# Patient Record
Sex: Male | Born: 1935 | Race: White | Hispanic: No | Marital: Married | Smoking: Former smoker
Health system: Southern US, Community
[De-identification: ages and names within clinical notes are randomized; demographics above are authoritative.]

## PROBLEM LIST (undated history)

## (undated) DIAGNOSIS — T8859XA Other complications of anesthesia, initial encounter: Secondary | ICD-10-CM

## (undated) DIAGNOSIS — R0600 Dyspnea, unspecified: Secondary | ICD-10-CM

## (undated) DIAGNOSIS — J189 Pneumonia, unspecified organism: Secondary | ICD-10-CM

## (undated) DIAGNOSIS — Z87891 Personal history of nicotine dependence: Secondary | ICD-10-CM

## (undated) DIAGNOSIS — H9190 Unspecified hearing loss, unspecified ear: Secondary | ICD-10-CM

## (undated) DIAGNOSIS — J449 Chronic obstructive pulmonary disease, unspecified: Secondary | ICD-10-CM

## (undated) DIAGNOSIS — M1990 Unspecified osteoarthritis, unspecified site: Secondary | ICD-10-CM

## (undated) DIAGNOSIS — I2510 Atherosclerotic heart disease of native coronary artery without angina pectoris: Secondary | ICD-10-CM

## (undated) DIAGNOSIS — Z87442 Personal history of urinary calculi: Secondary | ICD-10-CM

## (undated) DIAGNOSIS — I499 Cardiac arrhythmia, unspecified: Secondary | ICD-10-CM

## (undated) DIAGNOSIS — C189 Malignant neoplasm of colon, unspecified: Secondary | ICD-10-CM

## (undated) DIAGNOSIS — T4145XA Adverse effect of unspecified anesthetic, initial encounter: Secondary | ICD-10-CM

## (undated) DIAGNOSIS — I10 Essential (primary) hypertension: Secondary | ICD-10-CM

## (undated) HISTORY — DX: Essential (primary) hypertension: I10

## (undated) HISTORY — DX: Atherosclerotic heart disease of native coronary artery without angina pectoris: I25.10

## (undated) HISTORY — DX: Personal history of nicotine dependence: Z87.891

## (undated) HISTORY — DX: Unspecified osteoarthritis, unspecified site: M19.90

## (undated) HISTORY — DX: Malignant neoplasm of colon, unspecified: C18.9

## (undated) HISTORY — DX: Chronic obstructive pulmonary disease, unspecified: J44.9

---

## 2006-02-09 HISTORY — PX: CORONARY ANGIOPLASTY WITH STENT PLACEMENT: SHX49

## 2007-11-09 ENCOUNTER — Ambulatory Visit: Payer: Self-pay | Admitting: Cardiology

## 2007-11-16 ENCOUNTER — Ambulatory Visit: Payer: Self-pay

## 2007-11-21 ENCOUNTER — Ambulatory Visit: Payer: Self-pay | Admitting: Cardiology

## 2007-11-21 LAB — CONVERTED CEMR LAB
BUN: 12 mg/dL (ref 6–23)
Chloride: 102 meq/L (ref 96–112)
Eosinophils Absolute: 0.3 10*3/uL (ref 0.0–0.7)
Eosinophils Relative: 3.1 % (ref 0.0–5.0)
GFR calc Af Amer: 123 mL/min
GFR calc non Af Amer: 102 mL/min
HCT: 45.7 % (ref 39.0–52.0)
Hemoglobin: 15.3 g/dL (ref 13.0–17.0)
INR: 1 (ref 0.8–1.0)
MCV: 94.7 fL (ref 78.0–100.0)
Monocytes Absolute: 0.6 10*3/uL (ref 0.1–1.0)
Neutrophils Relative %: 71.7 % (ref 43.0–77.0)
Platelets: 254 10*3/uL (ref 150–400)
Potassium: 4.5 meq/L (ref 3.5–5.1)
RBC: 4.83 M/uL (ref 4.22–5.81)
RDW: 12.9 % (ref 11.5–14.6)
Sodium: 140 meq/L (ref 135–145)
WBC: 8 10*3/uL (ref 4.5–10.5)
aPTT: 27.5 s (ref 21.7–29.8)

## 2007-11-23 ENCOUNTER — Ambulatory Visit: Payer: Self-pay | Admitting: Cardiovascular Disease

## 2007-11-23 ENCOUNTER — Inpatient Hospital Stay (HOSPITAL_BASED_OUTPATIENT_CLINIC_OR_DEPARTMENT_OTHER): Admission: RE | Admit: 2007-11-23 | Discharge: 2007-11-23 | Payer: Self-pay | Admitting: Cardiology

## 2007-11-23 ENCOUNTER — Ambulatory Visit: Payer: Self-pay | Admitting: Cardiology

## 2007-11-23 ENCOUNTER — Inpatient Hospital Stay (HOSPITAL_COMMUNITY): Admission: AD | Admit: 2007-11-23 | Discharge: 2007-11-24 | Payer: Self-pay | Admitting: Cardiovascular Disease

## 2007-12-28 ENCOUNTER — Ambulatory Visit: Payer: Self-pay | Admitting: Cardiology

## 2008-01-10 HISTORY — PX: UMBILICAL HERNIA REPAIR: SHX196

## 2008-01-26 ENCOUNTER — Ambulatory Visit (HOSPITAL_COMMUNITY): Admission: RE | Admit: 2008-01-26 | Discharge: 2008-01-27 | Payer: Self-pay | Admitting: General Surgery

## 2008-01-26 ENCOUNTER — Encounter (INDEPENDENT_AMBULATORY_CARE_PROVIDER_SITE_OTHER): Payer: Self-pay | Admitting: General Surgery

## 2008-04-30 DIAGNOSIS — I2510 Atherosclerotic heart disease of native coronary artery without angina pectoris: Secondary | ICD-10-CM | POA: Insufficient documentation

## 2008-04-30 DIAGNOSIS — I10 Essential (primary) hypertension: Secondary | ICD-10-CM | POA: Insufficient documentation

## 2008-05-02 ENCOUNTER — Ambulatory Visit: Payer: Self-pay | Admitting: Cardiology

## 2008-09-03 ENCOUNTER — Encounter (INDEPENDENT_AMBULATORY_CARE_PROVIDER_SITE_OTHER): Payer: Self-pay | Admitting: *Deleted

## 2008-10-04 ENCOUNTER — Encounter: Payer: Self-pay | Admitting: Cardiology

## 2008-12-12 ENCOUNTER — Ambulatory Visit: Payer: Self-pay | Admitting: Cardiology

## 2008-12-12 DIAGNOSIS — E785 Hyperlipidemia, unspecified: Secondary | ICD-10-CM | POA: Insufficient documentation

## 2009-02-09 HISTORY — PX: OTHER SURGICAL HISTORY: SHX169

## 2009-02-09 HISTORY — PX: PORTACATH PLACEMENT: SHX2246

## 2009-09-02 ENCOUNTER — Telehealth: Payer: Self-pay | Admitting: Cardiology

## 2009-09-04 ENCOUNTER — Telehealth: Payer: Self-pay | Admitting: Cardiology

## 2009-10-18 ENCOUNTER — Ambulatory Visit: Admission: RE | Admit: 2009-10-18 | Discharge: 2009-12-16 | Payer: Self-pay | Admitting: Radiation Oncology

## 2009-10-18 ENCOUNTER — Ambulatory Visit (HOSPITAL_COMMUNITY): Admission: RE | Admit: 2009-10-18 | Discharge: 2009-10-18 | Payer: Self-pay | Admitting: Internal Medicine

## 2009-11-11 ENCOUNTER — Telehealth: Payer: Self-pay | Admitting: Cardiology

## 2009-12-18 ENCOUNTER — Ambulatory Visit: Payer: Self-pay | Admitting: Cardiology

## 2010-01-09 ENCOUNTER — Telehealth (INDEPENDENT_AMBULATORY_CARE_PROVIDER_SITE_OTHER): Payer: Self-pay | Admitting: *Deleted

## 2010-01-10 HISTORY — PX: HEMICOLECTOMY: SHX854

## 2010-02-09 HISTORY — PX: INGUINAL HERNIA REPAIR: SUR1180

## 2010-03-06 ENCOUNTER — Telehealth: Payer: Self-pay | Admitting: Cardiology

## 2010-03-11 NOTE — Assessment & Plan Note (Signed)
Summary: [facility]  Medications Added HYDROCHLOROTHIAZIDE 25 MG TABS (HYDROCHLOROTHIAZIDE) 1 by mouth daily      Allergies Added: NKDA  Visit Type:  Follow-up Primary Provider:  Belva Agee, NP  CC:  CAD.  History of Present Illness: The patient presents for yearly followup. Unfortunately since I last saw him he has been diagnosed with colon cancer and he has undergone some chemotherapy and radiation. He is also apparently going to have surgery. He has had no new chest discomfort, neck or arm discomfort. He has had no new palpitations, presyncope or syncope. He has been exercising routinely despite limitations with back and leg problems. He walks with 2 canes.  Current Medications (verified): 1)  Lotrel 10-40 Mg Caps (Amlodipine Besy-Benazepril Hcl) .Marland Kitchen.. 1 By Mouth Daily 2)  Simvastatin 20 Mg Tabs (Simvastatin) .... One Every Evening 3)  Metoprolol Tartrate 100 Mg Tabs (Metoprolol Tartrate) .Marland Kitchen.. 1 By Mouth Daily 4)  Aspirin 325 Mg  Tabs (Aspirin) .Marland Kitchen.. 1 By Mouth Daily 5)  Nitrostat 0.4 Mg Subl (Nitroglycerin) .Marland Kitchen.. 1 By Mouth As Needed For Chest Pain 6)  Hydrochlorothiazide 25 Mg Tabs (Hydrochlorothiazide) .Marland Kitchen.. 1 By Mouth Daily  Allergies (verified): No Known Drug Allergies  Past History:  Past Medical History:  1. Coronary artery disease (abnormal stress perfusion study with       apical and inferior wall ischemia.  The LAD had a long 99% stenosis       followed by 30% stenosis, first diagonal 25% stenosis, circumflex       luminal irregularities, second obtuse marginal 25% stenosis, right       coronary artery 40% followed by 40% stenosis.  EF 60%.  He       underwent stenting with 2 bare-metal stents by Dr. Excell Seltzer to the       LAD).   2. Hypertension.   3. Previous tobacco use.   4. Colon cancer     Past Surgical History: Reviewed history from 12/12/2008 and no changes required. Umbilical hernia repair  Review of Systems       As stated in the HPI and  negative for all other systems.   Vital Signs:  Patient profile:   75 year old male Height:      75 inches Weight:      199 pounds BMI:     24.96 Pulse rate:   67 / minute Resp:     18 per minute BP sitting:   104 / 60  (right arm)  Vitals Entered By: Marrion Coy, CNA (December 18, 2009 11:34 AM)  Physical Exam  General:  Well developed, well nourished, in no acute distress. Head:  normocephalic and atraumatic Mouth:  Port dentition, gums and palate normal. Oral mucosa normal. Neck:  Neck supple, no JVD. No masses, thyromegaly or abnormal cervical nodes. Chest Wall:  no deformities or breast masses noted Lungs:  Clear bilaterally to auscultation and percussion. Heart:  Non-displaced PMI, chest non-tender; regular rate and rhythm, S1, S2 without murmurs, rubs or gallops. Carotid upstroke normal, no bruit. Normal abdominal aortic size, no bruits. Femorals normal pulses, no bruits. Pedals normal pulses.  Abdomen:  Bowel sounds positive; abdomen soft and non-tender without masses, organomegaly, or hernias noted. No hepatosplenomegaly. Msk:  Back normal, normal gait. Muscle strength and tone normal. Extremities:  Moderate bilateral lower extremity edema with chronic venous stasis changes Neurologic:  Alert and oriented x 3. Skin:  Intact without lesions or rashes. Cervical Nodes:  no significant adenopathy Axillary Nodes:  no significant adenopathy Inguinal Nodes:  no significant adenopathy Psych:  Normal affect.    EKG  Procedure date:  12/18/2009  Findings:      Sinus rhythm, right bundle branch block, no acute ST-T wave changes  Impression & Recommendations:  Problem # 1:  CAD, UNSPECIFIED SITE (ICD-414.00) The patient has had no new cardiovascular symptoms. No further cardiovascular testing is suggested. We will continue with risk reduction. According to ACC/AHA guidelines he would be an acceptable risk for upcoming colon surgery. Orders: EKG w/ Interpretation  (93000)  Problem # 2:  HYPERTENSION, UNSPECIFIED (ICD-401.9) His blood pressure is actually running low.  He will remain on the meds as listed.  Problem # 3:  DYSLIPIDEMIA (ICD-272.4) He is having this followed by his primary MD.  The goal will be an LDL less than 70 and HDL greater than 40.  Patient Instructions: 1)  Your physician recommends that you schedule a follow-up appointment in: 12 MONTHS WITH DR [facility] [place] 2)  Your physician recommends that you continue on your current medications as directed. Please refer to the Current Medication list given to you today. Prescriptions: HYDROCHLOROTHIAZIDE 25 MG TABS (HYDROCHLOROTHIAZIDE) 1 by mouth daily  #90 x 3   Entered by:   Marrion Coy, CNA   Authorized by:   Rollene Rotunda, MD, [facility]   Signed by:   Marrion Coy, CNA on 12/18/2009   Method used:   Faxed to ...       [facility] (mail-order)             , [place]         Ph: [phone]       Fax: [phone]   RxID:   [id number]

## 2010-03-11 NOTE — Progress Notes (Signed)
Summary: refill request   Phone Note Refill Request Message from:  Patient on November 11, 2009 12:25 PM  Refills Requested: Medication #1:  METOPROLOL-HYDROCHLOROTHIAZIDE 50-25 MG TABS 1 by mouth daily  Medication #2:  SIMVASTATIN 20 MG TABS one every evening amlodipine uses [facility]   Method Requested: Telephone to Pharmacy Initial call taken by: Glynda Jaeger,  November 11, 2009 12:26 PM  Follow-up for Phone Call        [facility] for pt that RX sent in. Follow-up by: Marrion Coy, CNA,  November 12, 2009 10:28 AM    Prescriptions: SIMVASTATIN 20 MG TABS (SIMVASTATIN) one every evening  #90 x 1   Entered by:   Marrion Coy, CNA   Authorized by:   Rollene Rotunda, MD, [facility]   Signed by:   Marrion Coy, CNA on 11/12/2009   Method used:   Faxed to ...       [facility] (mail-order)             , [place]         Ph: [phone]       Fax: [phone]   RxID:   [id number] METOPROLOL TARTRATE 100 MG TABS (METOPROLOL TARTRATE) 1 by mouth daily  #90 Tablet x 1   Entered by:   Marrion Coy, CNA   Authorized by:   Rollene Rotunda, MD, [facility]   Signed by:   Marrion Coy, CNA on 11/12/2009   Method used:   Faxed to ...       [facility] (mail-order)             , [place]         Ph: [phone]       Fax: [phone]   RxID:   [id number] LOTREL 10-40 MG CAPS (AMLODIPINE BESY-BENAZEPRIL HCL) 1 by mouth daily  #90 x 1   Entered by:   Marrion Coy, CNA   Authorized by:   Rollene Rotunda, MD, [facility]   Signed by:   Marrion Coy, CNA on 11/12/2009   Method used:   Faxed to ...       [facility] (mail-order)             , [place]         Ph: [phone]       Fax: [phone]   RxID:   [id number] METOPROLOL-HYDROCHLOROTHIAZIDE 50-25 MG TABS (METOPROLOL-HYDROCHLOROTHIAZIDE) 1 by mouth daily  #90 Tablet x 1   Entered by:   Marrion Coy, CNA   Authorized by:   Rollene Rotunda, MD, [facility]   Signed by:   Marrion Coy, CNA on 11/12/2009   Method used:   Faxed to ...       [facility] (mail-[facility])             , [place]          Ph: [phone]       Fax: [phone]   RxID:   [id number]

## 2010-03-11 NOTE — Progress Notes (Signed)
Summary: verify medication   Phone Note From Pharmacy Call back at [phone]   Caller: [facility]/ [phone] Summary of Call: Need to verify Simvastatin Initial call taken by: Judie Grieve,  September 02, 2009 1:20 PM  Follow-up for Phone Call        PHARMACY NOTIFIED WILL FORWARD TO DR [facility]  FOR REVIEW IN MEANTIME WILL NOTIFY PT TO DECREASE SIMVASTATIN TO 20 MG .  PT AWARE TO DECREASE SIMVASTATIN TO 20 MG Follow-up by: Scherrie Bateman, LPN,  September 02, 2009 1:38 PM

## 2010-03-11 NOTE — Progress Notes (Signed)
Summary: Calling regarding Simvastatin 20mg  need new prescription  Medications Added SIMVASTATIN 20 MG TABS (SIMVASTATIN) one every evening       Phone Note From Pharmacy Call back at [phone]   Caller: [facility] Summary of Call: Calling regarding Simvastatin 20mg  fax new prescription to [facility] [phone] Initial call taken by: Judie Grieve,  September 04, 2009 2:01 PM    New/Updated Medications: SIMVASTATIN 20 MG TABS (SIMVASTATIN) one every evening Prescriptions: SIMVASTATIN 20 MG TABS (SIMVASTATIN) one every evening  #90 x 3   Entered by:   Charolotte Capuchin, RN   Authorized by:   Rollene Rotunda, MD, [facility]   Signed by:   Charolotte Capuchin, RN on 09/04/2009   Method used:   Faxed to ...       [facility] (mail-order)             , [place]         Ph: [phone]       Fax: [phone]   RxID:   [id number]

## 2010-03-11 NOTE — Progress Notes (Signed)
   All Cardiac records faxed to [facility] @ [phone] [facility]  January 09, 2010 9:04 AM

## 2010-03-13 NOTE — Progress Notes (Signed)
Summary: pt needs refill sent to [facility]   Phone Note Refill Request Message from:  Patient on [facility]   Refills Requested: Medication #1:  SIMVASTATIN 20 MG TABS one every evening  Medication #2:  LOTREL 10-40 MG CAPS 1 by mouth daily  Medication #3:  METOPROLOL TARTRATE 100 MG TABS 1 by mouth daily pt needs 90day suppy with 3refills sent into [facility]  Initial call taken by: Omer Jack,  March 06, 2010 1:24 PM  Follow-up for Phone Call        RX sent into pharmacy. Pt notified. Pt needs to get rx for simvastatin from pcp. pt understands. Marrion Coy, CNA  March 06, 2010 1:32 PM  Follow-up by: Marrion Coy, CNA,  March 06, 2010 1:32 PM    Prescriptions: METOPROLOL TARTRATE 100 MG TABS (METOPROLOL TARTRATE) 1 by mouth daily  #90 Tablet x 2   Entered by:   Marrion Coy, CNA   Authorized by:   Rollene Rotunda, MD, [facility]   Signed by:   Marrion Coy, CNA on 03/06/2010   Method used:   Faxed to ...       [facility] (mail-order)             , [place]         Ph: [phone]       Fax: [phone]   RxID:   [id number] LOTREL 10-40 MG CAPS (AMLODIPINE BESY-BENAZEPRIL HCL) 1 by mouth daily  #90 x 2   Entered by:   Marrion Coy, CNA   Authorized by:   Rollene Rotunda, MD, [facility]   Signed by:   Marrion Coy, CNA on 03/06/2010   Method used:   Faxed to ...       [facility] (mail-[facility])             , [place]         Ph: [phone]       Fax: [phone]   RxID:   [id number]

## 2010-04-24 LAB — GLUCOSE, CAPILLARY: Glucose-Capillary: 102 mg/dL — ABNORMAL HIGH (ref 70–99)

## 2010-06-02 ENCOUNTER — Encounter: Payer: Self-pay | Admitting: Nurse Practitioner

## 2010-06-02 DIAGNOSIS — M1990 Unspecified osteoarthritis, unspecified site: Secondary | ICD-10-CM | POA: Insufficient documentation

## 2010-06-02 DIAGNOSIS — I2510 Atherosclerotic heart disease of native coronary artery without angina pectoris: Secondary | ICD-10-CM | POA: Insufficient documentation

## 2010-06-02 DIAGNOSIS — M1120 Other chondrocalcinosis, unspecified site: Secondary | ICD-10-CM | POA: Insufficient documentation

## 2010-06-02 DIAGNOSIS — C189 Malignant neoplasm of colon, unspecified: Secondary | ICD-10-CM | POA: Insufficient documentation

## 2010-06-02 DIAGNOSIS — I10 Essential (primary) hypertension: Secondary | ICD-10-CM | POA: Insufficient documentation

## 2010-06-24 NOTE — Discharge Summary (Signed)
[name], [name]                 ACCOUNT NO.:  [id number]   MEDICAL RECORD NO.:  [id number]          PATIENT TYPE:  OIB   LOCATION:  6527                         FACILITY:  [facility]   PHYSICIAN:  Rollene Rotunda, MD, FACCDATE OF BIRTH:  1936-01-17   DATE OF ADMISSION:  11/09/2007  DATE OF DISCHARGE:                               DISCHARGE SUMMARY   PRIMARY CARDIOLOGIST:  Rollene Rotunda, MD, [facility].   PRIMARY CARE [name]:  [name], MD   DISCHARGE DIAGNOSIS:  Coronary artery disease.   SECONDARY DIAGNOSES:  1. Hypertension.  2. Tobacco abuse.  3. Large umbilical hernia, pending surgical repair.  4. Mild hyperglycemia/? Impaired fasting glucose.   ALLERGIES:  No known drug allergies.   PROCEDURES:  Left heart cardiac catheterization with successful  percutaneous coronary intervention and stenting of the proximal left  anterior descending with placement of 3.5 x 15-mm Vision bare-metal  stent as well as a 3.0 x 8-mm Vision bare-metal stent.   HISTORY OF PRESENT ILLNESS:  A 75 year old Caucasian male with the above  problem list.  He recently saw Dr. Antoine Poche in clinic on November 09, 2007, for cardiology clearance as he is pending umbilical hernia repair.  Given his risk factors as determined that he would require an adenosine  Myoview.  Myoview was subsequently performed on November 16, 2007,  revealing an EF of 58% with suggestive apical and inferior ischemia.  Decision was made to pursue cardiac catheterization.   HOSPITAL COURSE:  The patient underwent left heart cardiac  catheterization on November 23, 2007, revealing a 99% stenosis in the  proximal LAD.  He otherwise had nonobstructive coronary artery disease  with normal LV function and EF of 60%.  Films were reviewed by Dr.  Tonny Bollman and the patient underwent successful PCI and stenting of  the proximal LAD with placement two bare-metal stents.  The patient  tolerated this procedure well and postprocedure, he  has been ambulating  without difficulty.  He has had no recurrent symptoms.  He will be  discharged home today in good condition.   DISCHARGE LABORATORY DATA:  Hemoglobin 14.4, hematocrit 42.2, WBC 8.0,  and platelets 352.  Sodium 137, potassium 3.5, chloride 101, CO2 28, BUN  5, creatinine 0.61, glucose 103, and calcium 8.9.   DISPOSITION:  The patient will be discharged home today in good  condition.   FOLLOWUP PLANS AND APPOINTMENTS:  The patient is to follow with Dr.  Rudi Heap as previously scheduled and to follow with Dr. Antoine Poche in  [place] on December 28, 2007, at 11:15 a.m.   DISCHARGE MEDICATIONS:  1. Aspirin 325 daily.  2. Plavix 5 mg daily x30 days.  3. Metoprolol/hydrochlorothiazide 50/25 mg daily.  4. Metoprolol 100 mg q.h.s.  5. Amlodipine/benazepril 10/40 mg daily.  6. Nitroglycerin 0.4 mg sublingually p.r.n. chest pain.  7. Simvastatin 40 mg q.h.s.   OUTSTANDING LABORATORY STUDIES:  Fasting lipids and LFTs.   Duration discharge encounter 40 minutes including physician time.      [name], ANP      Rollene Rotunda, MD, [facility]  Electronically  Signed    CB/MEDQ  D:  11/24/2007  T:  11/24/2007  Job:  [id number]   cc:   [name], M.D.

## 2010-06-24 NOTE — Assessment & Plan Note (Signed)
[facility]                            CARDIOLOGY OFFICE NOTE   [name], [name]                        MRN:          [id number]  DATE:05/02/2008                            DOB:          12-Apr-1935    PRIMARY CARE PHYSICIAN:  [name], MD   REASON FOR PRESENTATION:  Evaluate the patient with coronary artery  disease.   HISTORY OF PRESENT ILLNESS:  The patient is 75 years old.  He presents  for followup.  Since I last saw him, he has had his umbilical hernia  repaired.  He did well with this.  He has had no chest discomfort, neck  or arm discomfort.  He has had no palpitations, presyncope, or syncope.  He has stopped smoking!  He is walking regularly for exercise.  He is  trying to watch his diet, though this may not be quite as good as I  would like.   PAST MEDICAL HISTORY:  1. Coronary artery disease (abnormal stress perfusion study with      apical and inferior wall ischemia.  The LAD had a long 99% stenosis      followed by 30% stenosis, first diagonal 25% stenosis, circumflex      luminal irregularities, second obtuse marginal 25% stenosis, right      coronary artery 40% followed by 40% stenosis.  EF 60%.  He      underwent stenting with 2 bare-metal stents by Dr. Excell Seltzer to the      LAD).  2. Hypertension.  3. Previous tobacco use.  4. Status post umbilical hernia repair.   ALLERGIES:  None.   MEDICATIONS:  1. Aspirin 325 mg daily.  2. Metoprolol/HCTZ 50/25 daily.  3. Metoprolol 100 mg daily.  4. Lotrel 10/40 daily.  5. Simvastatin 40 mg daily.   REVIEW OF SYSTEMS:  As stated in the HPI, and otherwise negative for all  other systems.   PHYSICAL EXAMINATION:  GENERAL:  The patient is pleasant and in no  distress.  VITAL SIGNS:  Blood pressure 148/86 and heart rate 53 and regular.  HEENT:  Eyelids unremarkable; pupils equal, round, and reactive to  light, fundi not visualized.  NECK:  No jugular venous distention at 45  degrees; carotid upstroke  brisk and symmetric; no bruits, no thyromegaly.  LYMPHATICS:  No adenopathy.  LUNGS:  Clear to auscultation bilaterally.  BACK:  No costovertebral angle tenderness.  CHEST:  Unremarkable.  HEART:  PMI not displaced or sustained; S1 and S2 within normal limits;  no S3, no S4; no clicks, no rubs, no murmurs.  ABDOMEN:  Flat; well-healed surgical scar; positive bowel sounds; normal  in frequency and pitch; no bruits, no rebound, no guarding; no midline  pulsatile mass, no organomegaly.  SKIN:  No rashes.  No nodules.  EXTREMITIES:  Pulses 2+, no edema.   EKG; sinus bradycardia, right bundle-branch block, premature ectopic  complexes, no acute ST wave changes.   ASSESSMENT AND PLAN:  1. Coronary artery disease.  The patient is having no new symptoms.      No further cardiovascular  testing is suggested.  He will continue      with risk reduction.  2. Hypertension.  Blood pressure is slightly elevated today.  However,      this is unusual.  He states he gets checked a cardiac rehab and is      doing quite well.  Therefore, I will make no change to his medical      regimen.  3. Dyslipidemia.  I reviewed his lipids at [facility].  He had      an excellent lipid profile and we will remain on the meds as      listed.  4. Followup.  I will see him again in 6 months and then probably      yearly thereafter.     Rollene Rotunda, MD, [facility]  Electronically Signed    JH/MedQ  DD: 05/02/2008  DT: 05/03/2008  Job #: [id number]   cc:   [name], M.D.

## 2010-06-24 NOTE — Assessment & Plan Note (Signed)
[facility]                            CARDIOLOGY OFFICE NOTE   [name], [name]                          MRN:          [id number]  DATE:12/28/2007                            DOB:          1935/08/22    PRIMARY CARE PHYSICIAN:  [name], MD   REASON FOR PRESENTATION:  Evaluate the patient with coronary disease  status post PCI.   HISTORY OF PRESENT ILLNESS:  The patient initially presented to my  office for preoperative evaluation prior to getting an umbilical hernia  repaired.  However, he had significant cardiovascular risk factors and  underwent stress perfusion imaging.  This demonstrated an EF of 58%.  There was ischemia in the apex and inferior wall.  The patient  subsequently underwent a cardiac catheterization.  This demonstrated  left main was normal, the LAD had long 99% stenosis followed by long 30%  stenosis.  The first diagonal had 25% stenosis.  The circumflex had  luminal irregularities, the ramus intermediate was normal, first obtuse  marginal was normal, the second obtuse marginal had 25% stenosis.  Right  coronary artery is dominant with proximal 40% followed by 40% stenosis.  EF was 60%.  The patient subsequently underwent stenting with two bare-  metal stents by Dr. Excell Seltzer in the LAD.  He did well with this.  He has  completed his Plavix for 1 month.  He is planning on doing cardiac  rehab.  He has been doing a bit of walking around his house and he says  he is not as tired as he used to be.  He is not having any chest  discomfort, neck or arm discomfort.  He is not having any palpitation,  presyncope, or syncope.  He is having no PND or orthopnea.   PAST MEDICAL HISTORY:  Coronary artery disease as described,  hypertension, longstanding tobacco abuse (he is down to eight  cigarettes).   ALLERGIES:  None.   MEDICATIONS:  1. Aspirin 325 mg daily.  2. Metoprolol/hydrochlorothiazide 50/25 daily.  3. Metoprolol 100 mg  daily.  4. Lotrel 10/40 daily.  5. Simvastatin 40 mg daily.   REVIEW OF SYSTEMS:  As stated in the HPI and otherwise negative for  other systems.   PHYSICAL EXAMINATION:  GENERAL:  The patient is in no distress.  VITAL SIGNS:  Blood pressure 130/72, heart rate 66 and regular, weight  220 pounds, body mass index 26.  HEENT:  Eyes unremarkable.  Pupils equal, round, and reactive to light.  Fundi not visualized.  Oral mucosa unremarkable.  NECK:  No jugular venous distention at 45 degrees.  Carotid upstroke  brisk and symmetrical.  No bruits, no thyromegaly.  LYMPHATICS:  No  cervical, axillary, or inguinal adenopathy.  LUNGS:  Clear to auscultation bilaterally.  BACK:  No costovertebral angle tenderness.  CHEST:  Unremarkable.  HEART:  PMI not displaced or sustained.  S1 and S2 within normal limits.  No S3, no S4.  No clicks, no rubs, no murmurs.  ABDOMEN:  Large  umbilical hernia, positive bowel sounds.  No rebound, no guarding.  SKIN:  No rashes, no nodules.  EXTREMITIES:  2+ pulses, no edema.   EKG, sinus rhythm, right bundle-branch block, left axis deviation, left  anterior fascicular block, no acute ST-T wave changes.   ASSESSMENT AND PLAN:  1. Coronary artery disease.  The patient had this repaired and is now      being treated aggressively with secondary risk reduction.  At this      point, he will be able to have his elective umbilical hernia repair      in mid December.  I will send this note to Dr. Bertram Savin.  He      knows that he could have it done urgently as needed any time.  He      is off the Plavix.  2. Hypertension.  His blood pressure is well controlled and he will      continue the medications as listed.  3. Tobacco.  He knows he needs to quit smoking altogether.  He is      going to use the nicotine patch.  4. Dyslipidemia per his primary care physicians with a goal LDL less      than 100 and HDL greater than 40.  5. Followup.  I will see him in about 4  months, but will be available      to see him in the hospital if there are any questions at the time      of his umbilical hernia repair.     Rollene Rotunda, MD, [facility]  Electronically Signed    JH/MedQ  DD: 12/28/2007  DT: 12/29/2007  Job #: [id number]   cc:   Lennie Muckle, MD  [name], M.D.

## 2010-06-24 NOTE — Cardiovascular Report (Signed)
NAMESHIELDS, PAUTZ                 ACCOUNT NO.:  [id number]   MEDICAL RECORD NO.:  [id number]          PATIENT TYPE:  OIB   LOCATION:  1965                         FACILITY:  [facility]   PHYSICIAN:  Rollene Rotunda, MD, FACCDATE OF BIRTH:  April 02, 1935   DATE OF PROCEDURE:  DATE OF DISCHARGE:  11/23/2007                            CARDIAC CATHETERIZATION   PRIMARY CARE PHYSICIAN:  [name], MD   PROCEDURE:  Left heart catheterization/coronary arteriography.   INDICATIONS:  Evaluate the patient with multiple cardiovascular risk  factors and a Cardiolite suggesting anteroapical and inferoapical  ischemia.   PROCEDURE NOTE:  Left heart catheterization was performed via the right  femoral artery.  The artery was cannulated using the anterior wall  puncture.  A #4-French arterial sheath was inserted via the modified  Seldinger technique.  Preformed Judkins and a pigtail catheter were  utilized.  The patient tolerated the procedure well and left the lab in  stable condition.   RESULTS:  Hemodynamics:  LV 154/22, AO 151/81.  Coronaries, left main  was normal.  The LAD had proximal long 99% stenosis followed by long 30%  stenosis.  First diagonal was large with proximal 25% stenosis and mid  25% stenosis.  Circumflex in the AV groove had diffuse luminal  irregularities.  Ramus intermediate was small and normal.  First obtuse  marginal was moderate sized with diffuse luminal irregularities.  The  second obtuse marginal was large with ostial 25% stenosis.  Posterolateral was moderate normal.  The posterolateral 2 was small and  normal.  The right coronary artery was dominant.  There was proximal 40%  followed by 40% stenosis.  The PDA was moderate sized and normal.  Left  ventriculogram; the left ventriculogram was obtained in the RAO  projection.  The EF was 60% with normal wall motion.  Aortogram; an  aortogram was obtained secondary to some tortuosity noted in advancing  the catheter.   Though, there was incomplete filling, he was noted to  have a tortuous aorta with questionable aneurysmal dilatation of his  right iliac.  I will defer further evaluation of this to Dr. Excell Seltzer.   CONCLUSION:  Severe single-vessel coronary artery disease.  The patient  has diffuse nonobstructive disease in all of his vessels.  He has well-  preserved ejection fraction.  He has peripheral vascular disease as  described.   PLAN:  At this point, the patient will get a PTCA of his LAD per Dr.  Excell Seltzer.  This evaluation was done preoperatively prior to getting an  umbilical hernia repair.  He will need to wait to have this done.  He  needs aggressive risk reduction.      Rollene Rotunda, MD, [facility]  Electronically Signed    JH/MEDQ  D:  11/23/2007  T:  11/24/2007  Job:  [id number]   cc:   [name], M.D.

## 2010-06-24 NOTE — Cardiovascular Report (Signed)
[name], [name]                 ACCOUNT NO.:  [id number]   MEDICAL RECORD NO.:  [id number]          PATIENT TYPE:  OIB   LOCATION:  6527                         FACILITY:  [facility]   PHYSICIAN:  Veverly Fells. Excell Seltzer, MD  DATE OF BIRTH:  Mar 25, 1935   DATE OF PROCEDURE:  11/23/2007  DATE OF DISCHARGE:                            CARDIAC CATHETERIZATION   PROCEDURE:  Percutaneous transluminal coronary angioplasty and stenting  of the left anterior descending.   INDICATIONS:  Mr. Loescher is a 75 year old gentleman for upcoming hernia  repair.  He has multiple cardiac risk factors and underwent a Myoview  scan for preoperative evaluation.  He had significant ischemia in the  apex of the heart and was referred for diagnostic catheterization.  Dr.  Antoine Poche performed his catheterization this morning, which showed severe  proximal LAD stenosis.  He was referred for PCI.  We planned on using a  bare metal stent since he has a need for upcoming surgery to treat an  inguinal hernia.   Risks and indications of the procedure were reviewed with the patient.  Informed consent was obtained.  The right groin had a 4-French sheath in  place.  Using normal sterile technique, this was changed out to a 6-  [place] sheath.  Angiomax was used for anticoagulation.  The patient had  been preloaded with 600 mg of Plavix.  A 6-French SB LAD guide catheter  was inserted.  A cougar guidewire was used to wire the lesion once a  therapeutic ACT was achieved.  There was a 90% stenosis just beyond the  first diagonal branch.  There was diffuse disease in the midportion of  the LAD, but this appeared nonobstructive.  The vessel was predilated  with a 3.0 x 12 mm apex balloon, which was inflated to 8 atmospheres.  Following balloon dilatation, the balloon was used to assess the length  of the lesion.  It appeared to be approximately 15 mm.  A 3.5 x 15 mm  Vision stent was carefully positioned and deployed at 14  atmospheres.  Following stenting, there was significant residual stenosis of the  distal end of the stent.  I suspected that this might be vasospasm and  intracoronary nitroglycerin was given.  The wire was also pulled back  into that region.  The area did not change and there appeared to be  significant stenosis.  I elected to cover that with a 3.0 x 8 mm stent,  which was placed in an overlapping fashion with the initial stent.  This  was deployed at 15 atmospheres.  There was no significant residual  stenosis.  The entire stented segment was then postdilated with 3.75 x  15 mm Voyager [place] balloon.  This was taken to 12 atmospheres distally and  16 atmospheres over the proximal and midportions.  A total of 3  inflations were done.  Following post-dilatation, there was an excellent  angiographic result with TIMI III flow in the vessel and no significant  residual stenosis.  The guidewire was pulled back and final angiography  demonstrated an excellent result with stable disease  in the midportion  of the vessel.  The patient had no chest pain.  He tolerated the  procedure well.  The guide catheter was removed over a guidewire.   ASSESSMENT:  Successful percutaneous coronary intervention of the  proximal left anterior descending using overlapping Multilink Vision  bare metal stents.   PLAN:  I recommend aspirin and Plavix for minimum of 30 days.  The  patient will undergo hernia surgery once he has completed his  antiplatelet therapy.  He will be preferable to remain on aspirin as  possible.  However, this needs to be interrupted after 30 days that I  would be acceptable.      Veverly Fells. Excell Seltzer, MD  Electronically Signed     MDC/MEDQ  D:  11/23/2007  T:  11/24/2007  Job:  [id number]   cc:   Rollene Rotunda, MD, [facility]  [name], M.D.  Lennie Muckle, MD

## 2010-06-24 NOTE — Assessment & Plan Note (Signed)
[facility]                            CARDIOLOGY OFFICE NOTE   ARES, CARDOZO                          MRN:          [id number]  DATE:11/09/2007                            DOB:          1935-10-01    PRIMARY:  [name] Penna, MD.   REASON FOR PRESENTATION:  Preoperative evaluation of the patient with  multiple cardiovascular risk factors.   HISTORY OF PRESENT ILLNESS:  The patient is pleasant 75 year old  gentleman who has never had any prior cardiac history.  Admitted that he  does not see doctors frequently.  He went to see his primary care  physician recently for evaluation of umbilical hernia and was sent for  surgical management and evaluation.  He had some erythema and warmth and  was treated with antibiotics.  He needs to have this surgically  repaired.   The patient was noted during this evaluation in primary care office to  have hypertension, is currently having this treated and has been back  and forth to have his meds adjusted.  He is still hypertensive as  reported below.   The patient never really had his blood pressure checked frequently and  would not know whether he has been hypertensive long before this.  He  does not recall having his lipids checked yet, but is due to have labs.  He is not ever been told he is a diabetic.   He is active.  He does yard work.  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  With  this level of activity, he denies any chest discomfort, neck discomfort,  arm discomfort, activity induced nausea, vomiting, extensive  diaphoresis, but is not having palpitation or presyncope or syncope.  She has had no PND or orthopnea.   PAST MEDICAL HISTORY:  Recently diagnosed hypertension, longstanding  tobacco abuse (as mentioned there is no diagnosis of diabetes or  hyperlipidemia, although he is just starting to see a primary care  doctor routine).   PAST SURGICAL HISTORY:  None.   ALLERGIES:  None.   MEDICATIONS:   Amlodipine, benazepril 10/20, metoprolol 100 mg q. A.m.,  and 50 mg of metoprolol HCT q. p.m.   SOCIAL HISTORY:  The patient is retired.  He is married, he has 3  children and 2 grandchildren.  He was a 3-pack per day smoker.  He is  currently smoking only half pack a day, but has been smoking in total  for 57 years.   FAMILY HISTORY:  Noncontributory for early coronary artery disease.  He  does not know his brother's medical history.  His brother is still  alive.   REVIEW OF SYSTEMS:  As stated in the HPI, positive for joint pains.  Negative for all other systems.   PHYSICAL EXAMINATION:  GENERAL:  The patient is pleasant and looks  younger than his stated age.  He is in no distress.  VITAL SIGNS:  Blood pressure 149/83, heart rate is 64 and regular,  weight 221 pounds, body mass index 26.  HEENT:  Eyelids unremarkable, pupils equal, round, and reactive to  light,  fundi within normal limits, oral mucosa unremarkable.  NECK:  No  jugular distention at 45 degrees, carotid upstroke brisk and symmetric,  no bruits, no thyromegaly.  LYMPHATICS:  No cervical, axillary, or inguinal adenopathy.  LUNGS:  Clear to auscultation bilaterally.  BACK:  No costovertebral angle tenderness.  CHEST:  Unremarkable.  HEART:  PMI not displaced or sustained, S1 and S2 within normal limits,  no S3, S4, no clicks, no rubs, no murmurs.  ABDOMEN:  Large umbilical hernia, positive bowel sounds normal in  frequency and pitch, no bruits, no rebound, no guarding or midline  pulsatile mass.  No hepatomegaly, splenomegaly.  SKIN:  No rashes, no lesions.  EXTREMITIES:  2+ pulses throughout, trace edema, no cyanosis or  clubbing.  NEURO:  Oriented to person, place, and time, cranial nerves II through  XII grossly intact, motor grossly intact.   EKG, sinus rhythm, rate 60, right bundle-branch block, left axis  deviation, left anterior fascicular block, no acute ST-T wave changes.   ASSESSMENT AND PLAN:  1.  Preoperative clearance.  The patient has significant cardiovascular      risk factors.  He is going for a moderate risk procedure from      cardiovascular standpoint.  He does have a reasonable functional      level, but is limited by some joint pain.  At this point, screening      with his stress test would be prudent.  He does not think it would      be able to walk on a treadmill because of some knee pain.      Therefore, will have an adenosine Cardiolite.  Further risk      stratification will be based on these results.  2. Hypertension.  Blood pressure is slightly elevated, but they have      just recently started and up titrated meds and they are continuing      to work on this.  I will defer to his primary care doctors.  3. Abnormal EKG.  The patient has right bundle branch block.  The      appearance is consistent with a Brugada EKG.  However, he has no      syncope or presyncope or family history.  Therefore, this is a      right bundle branch variant and no further testing other than above      is planned.  4. Risk reduction.  He is going to get a lipid profile, fasting, and I      gave him instructions on this, I would be happy to review this.  5. Tobacco.  We talked about the need to stop smoking (greater than 3      minutes).  He does have the nicotine patches, but was afraid to      start this.  I told him that the controlled release of nicotine is      much lower risk than the bolus he gets from cigarettes.  He will      start the      nicotine patch.  6. Followup.  I will see him back based on results of the stress      perfusion study.     Rollene Rotunda, MD, [facility]  Electronically Signed    JH/MedQ  DD: 11/09/2007  DT: 11/10/2007  Job #: [id number]   cc:   [name], M.D.  Lennie Muckle, MD

## 2010-06-24 NOTE — Discharge Summary (Signed)
[name], [name]                 ACCOUNT NO.:  [id number]   MEDICAL RECORD NO.:  [id number]          PATIENT TYPE:  OIB   LOCATION:  1536                         FACILITY:  [facility]   PHYSICIAN:  Lennie Muckle, MD      DATE OF BIRTH:  02-08-1936   DATE OF ADMISSION:  01/26/2008  DATE OF DISCHARGE:  01/27/2008                               DISCHARGE SUMMARY   DIAGNOSIS:  Status post laparoscopic umbilical hernia repair.   HOSPITAL COURSE:  [name] is a 75 year old male on whom was performed  laparoscopic umbilical hernia repair.  He has done well overnight. Pain  is well controlled on Percocet.  He will be discharged home with  continuation of Percocet and instructed to take stool softener as needed  for constipation. Follow up with me in 2-3 weeks.   CONDITION ON DISCHARGE:  Improved.   Medications include Percocet, and he was continued on all his home  medications.      Lennie Muckle, MD  Electronically Signed     ALA/MEDQ  D:  01/27/2008  T:  01/27/2008  Job:  [id number]   cc:   [name], M.D.  Fax: [phone]   Rollene Rotunda, MD, [facility]  [address]  [address]  [place]  [place] [address]

## 2010-06-24 NOTE — Op Note (Signed)
[name]                 ACCOUNT NO.:  [id number]   MEDICAL RECORD NO.:  [id number]          PATIENT TYPE:  OIB   LOCATION:  1536                         FACILITY:  [facility]   PHYSICIAN:  Lennie Muckle, MD      DATE OF BIRTH:  [birth year]   DATE OF PROCEDURE:  01/26/2008  DATE OF DISCHARGE:  01/27/2008                               OPERATIVE REPORT   PREOPERATIVE DIAGNOSIS:  Umbilical hernia.   POSTOPERATIVE DIAGNOSIS:  Umbilical hernia.   PROCEDURE:  Laparoscopic and open umbilical hernia repair.   SURGEON:  Lennie Muckle, MD   ASSISTANT:  None.   ANESTHESIA:  General endotracheal anesthesia.   FINDINGS:  A 4 cm defect at the umbilical area.   SPECIMENS:  Hernia sac and skin to pathology.   ESTIMATED BLOOD LOSS:  Minimal amount of blood loss.   COMPLICATIONS:  No immediate complications.   INDICATIONS FOR PROCEDURE:  [name] is a 75 year old male whom I had  seen due to a large umbilical hernia.  He states he has had the hernia  for greater than 8 years.  He began to have some discomfort at the  umbilical area and wanted to have repair due to the size.  He did see  cardiology preoperatively and was cleared for his surgery after  procedure.   Informed consent was obtained for the procedure.  I did discuss risk of  conversion to a full open procedure, injury to intestine, mesh infection  and seroma.  All questions were answered.   DESCRIPTION OF PROCEDURE:  [name] was identified in the preoperative  holding area.  He received 2 g of Kefzol and was taken to the operating  room.  Once in the operating room he was placed in a supine position.  After administration of general endotracheal anesthesia a Foley was  placed.  His abdomen was then prepped and draped in the usual sterile  fashion.  A time-out procedure to assure patient and procedure were  performed.  I placed an incision in the left side of the abdomen after  ensuring orogastric tube placement.  Using  the OptiView I placed a 5 mm  trocar into the abdominal cavity.  All layers of abdominal wall were  visualized upon entry.  After adequate pneumo insufflation I inspected  the abdomen.  There was no evidence of injury upon placement of the  trocar.  The hernia was noted at the umbilicus.  I was able to partially  reduce this by gently massaging.  I then placed an additional 5 mm  trocar in the left upper quadrant.  Using a blunt grasper I attempted to  pull the omentum out of the hernia.  There also was colon within the  hernia.  I then placed an 11 mm trocar under visualization with camera  in the left side of the lower abdomen.  Using gentle retraction as well  as pressure I was able to fully reduce all the contents of the hernia.  I then placed a ruler inside the abdominal cavity and measured the  defect to  be 4 cm.  I then measured approximately 5 cm all around the  hernia for placement of the mesh.  I chose a piece of 15 x 20 mesh.  I  then released pneumoperitoneum and placed incised the extra skin at the  umbilical hernia site.  I then dissected the hernia sac and removed this  from the operative field.  I then primarily closed the hernia defect  using one Prolene suture.  This was done in an interrupted fashion.  After closure of the fascia I then closed the subcutaneous tissues with  a 3-0 Vicryl.  I then reinsufflated the abdomen and placed the camera  inside the abdominal cavity.  I placed 2-0 Prolene on four sides of the  mesh and placed the 15 x 20 piece of Proceed mesh into the abdominal  cavity.  This was secured to the abdominal wall with a Prolene.  I then  used the ProTack device to secure the edges of the mesh to the abdominal  wall.  I then used Gore-Tex sutures intervening sutures between my  Prolene suture.  Approximately a total of eight sutures were placed for  the Prolene and for the Gore-Tex.  The mesh appeared to be in good  position.  I then closed the fascial  defect with the 11 mm trocar site  with zero Vicryl suture.  Final inspection of the abdomen revealed no  bleeding.  There was no evidence of intra-abdominal injury.  I then  released pneumoperitoneum, removed the trocars and closed the skin with  4-0 Monocryl.  Dermabond was placed for final dressing.  The patient was  extubated and his Foley catheter was removed and he was transported to  the post anesthesia care unit in stable condition.  He will be monitored  and will either be discharged home tonight or early in the morning.      Lennie Muckle, MD  Electronically Signed     ALA/MEDQ  D:  01/26/2008  T:  01/27/2008  Job:  [id number]   cc:   McPhail Shubert  [address]   [name], M.D.  Fax: [phone]   Rollene Rotunda, MD, [facility]  [address]  [address]  [place]  [place] [address]

## 2010-07-11 ENCOUNTER — Other Ambulatory Visit: Payer: Self-pay | Admitting: Cardiology

## 2010-10-06 ENCOUNTER — Other Ambulatory Visit: Payer: Self-pay | Admitting: Cardiology

## 2010-10-06 MED ORDER — METOPROLOL TARTRATE 100 MG PO TABS: 100.0000 mg | ORAL_TABLET | Freq: Every day | ORAL | Status: DC

## 2010-10-06 MED ORDER — AMLODIPINE BESY-BENAZEPRIL HCL 10-40 MG PO CAPS: 1.0000 | ORAL_CAPSULE | Freq: Every day | ORAL | Status: DC

## 2010-10-06 NOTE — Telephone Encounter (Signed)
Pt call for RX refill. Pt would like a years supply called in.

## 2010-11-11 LAB — BASIC METABOLIC PANEL
BUN: 5 — ABNORMAL LOW
Calcium: 8.9
Creatinine, Ser: 0.61
GFR calc Af Amer: >60
GFR calc non Af Amer: >60
Sodium: 137

## 2010-11-11 LAB — HEPATIC FUNCTION PANEL
AST: 15
Bilirubin, Direct: <0.1
Total Bilirubin: 0.5

## 2010-11-11 LAB — CBC
Hemoglobin: 14.4
MCHC: 34.2
Platelets: 352
RBC: 4.6
WBC: 8

## 2010-11-11 LAB — LIPID PANEL
Cholesterol: 164
LDL Cholesterol: 118 — ABNORMAL HIGH
Total CHOL/HDL Ratio: 5.3
Triglycerides: 73
VLDL: 15

## 2010-11-14 LAB — BASIC METABOLIC PANEL
BUN: 10 mg/dL (ref 6–23)
Calcium: 9.5 mg/dL (ref 8.4–10.5)
GFR calc non Af Amer: >60 mL/min (ref >60)
Glucose, Bld: 111 mg/dL — ABNORMAL HIGH (ref 70–99)
Sodium: 138 mEq/L (ref 135–145)

## 2010-11-14 LAB — HEMOGLOBIN AND HEMATOCRIT, BLOOD: Hemoglobin: 14.1 g/dL (ref 13.0–17.0)

## 2010-12-12 ENCOUNTER — Encounter: Payer: Self-pay | Admitting: Cardiology

## 2010-12-17 ENCOUNTER — Encounter: Payer: Self-pay | Admitting: Cardiology

## 2010-12-17 ENCOUNTER — Ambulatory Visit (INDEPENDENT_AMBULATORY_CARE_PROVIDER_SITE_OTHER): Payer: Medicare Other | Admitting: Cardiology

## 2010-12-17 DIAGNOSIS — E7800 Pure hypercholesterolemia, unspecified: Secondary | ICD-10-CM

## 2010-12-17 DIAGNOSIS — I2510 Atherosclerotic heart disease of native coronary artery without angina pectoris: Secondary | ICD-10-CM

## 2010-12-17 MED ORDER — NITROGLYCERIN 0.4 MG SL SUBL: 0.4000 mg | SUBLINGUAL_TABLET | SUBLINGUAL | Status: DC | PRN

## 2010-12-17 MED ORDER — ATORVASTATIN CALCIUM 20 MG PO TABS: 20.0000 mg | ORAL_TABLET | Freq: Every day | ORAL | Status: DC

## 2010-12-17 NOTE — Patient Instructions (Signed)
Please stop simvastatin and start lipitor 20 mg a day. Continue all other medications as listed.  Follow up in 1 year with Dr Antoine Poche.  You will receive a letter in the mail 2 months before you are due.  Please call us when you receive this letter to schedule your follow up appointment.

## 2010-12-17 NOTE — Progress Notes (Signed)
HPI The patient presents for yearly follow up.  Since last saw him he had colon surgery to treat cancer. He did well with this and apparently had no cardiovascular complaints. He is exercising at cardiac rehabilitation. With this he denies any chest pressure, neck or arm discomfort. He's not having any palpitations, presyncope or syncope. He has had no shortness of breath, PND or orthopnea. He has had no weight gain or edema.  No Known Allergies  Current Outpatient Prescriptions  Medication Sig Dispense Refill  . amLODipine-benazepril (LOTREL) 10-40 MG per capsule Take 1 capsule by mouth daily.  90 capsule  3  . aspirin 325 MG tablet Take 325 mg by mouth daily.        . hydrochlorothiazide 25 MG tablet Take 25 mg by mouth daily.        . metoprolol (LOPRESSOR) 100 MG tablet Take 1 tablet (100 mg total) by mouth daily.  90 tablet  3  . nitroGLYCERIN (NITROSTAT) 0.4 MG SL tablet Place 0.4 mg under the tongue every 5 (five) minutes as needed.        . simvastatin (ZOCOR) 40 MG tablet Take 20 mg by mouth at bedtime.          Past Medical History  Diagnosis Date  . Hypertension   . History of tobacco use   . Colon cancer   . CAD (coronary artery disease)     Abnormal stress perfusino study with apical and inferior wall ischemia. LAD had a long 99% stenosis followed by 30% stenosis, first diagonal 25% stenosis, circumflex luminal irregularities, second obtuse marginal 25% stenosis, RCA 40% followed by 40% stenosis. EF 60%. Stenting with 2 bare metal stents by Dr. Excell Seltzer to the LAD.    Past Surgical History  Procedure Date  . Umbilical hernia repair 01/2008    left    ROS:  As stated in the HPI and negative for all other systems.  PHYSICAL EXAM BP 138/80  Pulse 60  Resp 18  Ht 6\' 4"  (1.93 m)  Wt 242 lb (109.77 kg)  BMI 29.46 kg/m2 GENERAL:  Well appearing HEENT:  Pupils equal round and reactive, fundi not visualized, oral mucosa unremarkable NECK:  No jugular venous distention,  waveform within normal limits, carotid upstroke brisk and symmetric, no bruits, no thyromegaly LYMPHATICS:  No cervical, inguinal adenopathy LUNGS:  Clear to auscultation bilaterally BACK:  No CVA tenderness CHEST:  Unremarkable HEART:  PMI not displaced or sustained,S1 and S2 within normal limits, no S3, no S4, no clicks, no rubs, no murmurs ABD:  Flat, positive bowel sounds normal in frequency in pitch, no bruits, no rebound, no guarding, no midline pulsatile mass, no hepatomegaly, no splenomegaly, healed surgical scar EXT:  2 plus pulses throughout, mild bilateral edema, no cyanosis no clubbing SKIN:  No rashes no nodules NEURO:  Cranial nerves II through XII grossly intact, motor grossly intact throughout PSYCH:  Cognitively intact, oriented to person place and time  EKG:  Sinus rhythm, right bundle branch block, left anterior fascicular block, rate 60.  ASSESSMENT AND PLAN

## 2010-12-17 NOTE — Assessment & Plan Note (Signed)
He has had no symptoms since his PCI in the past.  He will continue with risk reduction.

## 2010-12-17 NOTE — Assessment & Plan Note (Signed)
His lipids are excellent. However, I will change him from simvastatin to Lipitor given the FDA black box warning when used in combination with amlodipine. He did have a lipid profile again in 8 weeks.

## 2010-12-17 NOTE — Assessment & Plan Note (Signed)
The blood pressure is at target. No change in medications is indicated. We will continue with therapeutic lifestyle changes (TLC).  

## 2010-12-30 ENCOUNTER — Telehealth: Payer: Self-pay | Admitting: Cardiology

## 2010-12-30 DIAGNOSIS — E7800 Pure hypercholesterolemia, unspecified: Secondary | ICD-10-CM

## 2010-12-30 MED ORDER — ATORVASTATIN CALCIUM 20 MG PO TABS: 20.0000 mg | ORAL_TABLET | Freq: Every day | ORAL | Status: DC

## 2010-12-30 NOTE — Telephone Encounter (Signed)
New Problem:  Called because Dr. Antoine Poche switched him from simvastatin to atorvastatin (LIPITOR) 20 MG tablet and he would like a prescription for a 90 day to be sent into [facility] [phone]. Please call back once the prescription has been faxed.

## 2011-03-17 ENCOUNTER — Telehealth: Payer: Self-pay | Admitting: Cardiology

## 2011-03-17 MED ORDER — HYDROCHLOROTHIAZIDE 25 MG PO TABS: 25.0000 mg | ORAL_TABLET | Freq: Every day | ORAL | Status: DC

## 2011-03-17 NOTE — Telephone Encounter (Signed)
[facility] NEEDS REFILLED USES, [facility]

## 2011-06-18 ENCOUNTER — Encounter: Payer: Self-pay | Admitting: *Deleted

## 2011-06-18 ENCOUNTER — Other Ambulatory Visit: Payer: Self-pay | Admitting: Cardiology

## 2011-06-18 MED ORDER — AMLODIPINE BESY-BENAZEPRIL HCL 10-40 MG PO CAPS: 1.0000 | ORAL_CAPSULE | Freq: Every day | ORAL | Status: DC

## 2011-06-18 MED ORDER — METOPROLOL TARTRATE 100 MG PO TABS: 100.0000 mg | ORAL_TABLET | Freq: Every day | ORAL | Status: DC

## 2011-07-30 ENCOUNTER — Encounter: Payer: Medicare Other | Admitting: Hematology and Oncology

## 2011-07-30 DIAGNOSIS — Z452 Encounter for adjustment and management of vascular access device: Secondary | ICD-10-CM

## 2011-07-30 DIAGNOSIS — C189 Malignant neoplasm of colon, unspecified: Secondary | ICD-10-CM

## 2011-09-04 ENCOUNTER — Other Ambulatory Visit: Payer: Self-pay | Admitting: Cardiology

## 2011-09-10 ENCOUNTER — Telehealth: Payer: Self-pay | Admitting: Cardiology

## 2011-09-10 ENCOUNTER — Encounter: Payer: Medicare Other | Admitting: Hematology and Oncology

## 2011-09-10 DIAGNOSIS — C187 Malignant neoplasm of sigmoid colon: Secondary | ICD-10-CM

## 2011-09-10 DIAGNOSIS — Z452 Encounter for adjustment and management of vascular access device: Secondary | ICD-10-CM

## 2011-09-10 NOTE — Telephone Encounter (Signed)
Patient  had blood work done in his PCP office  this past  Monday, and his Cholesterol level was high. Pt would like for Dr. Antoine Poche to change his medication. Patient was made aware that his PCP needs to fax the blood work result to Dr. Antoine Poche so he can review  it . Pt verbalized understanding.

## 2011-09-10 NOTE — Telephone Encounter (Signed)
Please return call to patient at [phone] to discuss medication

## 2011-10-22 ENCOUNTER — Encounter: Payer: Medicare Other | Admitting: Internal Medicine

## 2011-10-22 DIAGNOSIS — C19 Malignant neoplasm of rectosigmoid junction: Secondary | ICD-10-CM

## 2011-12-10 ENCOUNTER — Encounter: Payer: Self-pay | Admitting: Cardiology

## 2011-12-17 ENCOUNTER — Other Ambulatory Visit: Payer: Self-pay | Admitting: Cardiology

## 2011-12-17 DIAGNOSIS — E7800 Pure hypercholesterolemia, unspecified: Secondary | ICD-10-CM

## 2011-12-17 NOTE — Telephone Encounter (Signed)
..   Requested Prescriptions   Pending Prescriptions Disp Refills  . atorvastatin (LIPITOR) 20 MG tablet [Pharmacy Med Name: ATORVASTATIN TABS 20MG ] 90 tablet 2    Sig: TAKE 1 TABLET DAILY

## 2011-12-30 ENCOUNTER — Ambulatory Visit: Payer: Medicare Other | Admitting: Cardiology

## 2012-01-06 ENCOUNTER — Ambulatory Visit: Payer: Medicare Other | Admitting: Cardiology

## 2012-01-13 ENCOUNTER — Ambulatory Visit (INDEPENDENT_AMBULATORY_CARE_PROVIDER_SITE_OTHER): Payer: Medicare Other | Admitting: Cardiology

## 2012-01-13 ENCOUNTER — Encounter: Payer: Self-pay | Admitting: Cardiology

## 2012-01-13 VITALS — BP 122/71 | HR 80 | Ht 76.0 in | Wt 259.0 lb

## 2012-01-13 DIAGNOSIS — E785 Hyperlipidemia, unspecified: Secondary | ICD-10-CM

## 2012-01-13 DIAGNOSIS — I2510 Atherosclerotic heart disease of native coronary artery without angina pectoris: Secondary | ICD-10-CM

## 2012-01-13 DIAGNOSIS — I10 Essential (primary) hypertension: Secondary | ICD-10-CM

## 2012-01-13 NOTE — Progress Notes (Signed)
   HPI The patient presents for yearly follow up.  Since I saw him last he has done well. He is exercising at cardiac rehabilitation. With this he denies any chest pressure, neck or arm discomfort. He's not having any palpitations, presyncope or syncope. He has had no shortness of breath, PND or orthopnea. He has had no weight gain or edema.  No Known Allergies  Current Outpatient Prescriptions  Medication Sig Dispense Refill  . amLODipine-benazepril (LOTREL) 10-40 MG per capsule Take 1 capsule by mouth daily.  90 capsule  3  . amLODipine-benazepril (LOTREL) 10-40 MG per capsule TAKE 1 CAPSULE DAILY  90 capsule  1  . aspirin 325 MG tablet Take 325 mg by mouth daily.        Marland Kitchen atorvastatin (LIPITOR) 20 MG tablet TAKE 1 TABLET DAILY  90 tablet  2  . hydrochlorothiazide (HYDRODIURIL) 25 MG tablet Take 1 tablet (25 mg total) by mouth daily.  90 tablet  3  . metoprolol (LOPRESSOR) 100 MG tablet Take 1 tablet (100 mg total) by mouth daily.  90 tablet  3  . metoprolol (LOPRESSOR) 100 MG tablet TAKE 1 TABLET DAILY  90 tablet  1  . nitroGLYCERIN (NITROSTAT) 0.4 MG SL tablet Place 1 tablet (0.4 mg total) under the tongue every 5 (five) minutes as needed.  25 tablet  11    Past Medical History  Diagnosis Date  . Hypertension   . History of tobacco use   . Colon cancer     Radiation, chemo, surgery 2011  . CAD (coronary artery disease)     Abnormal stress perfusino study with apical and inferior wall ischemia. LAD had a long 99% stenosis followed by 30% stenosis, first diagonal 25% stenosis, circumflex luminal irregularities, second obtuse marginal 25% stenosis, RCA 40% followed by 40% stenosis. EF 60%. Stenting with 2 bare metal stents by Dr. Excell Seltzer to the LAD.    Past Surgical History  Procedure Date  . Umbilical hernia repair 01/2008    left  . Hemicolectomy 01/10/2010    ROS:  As stated in the HPI and negative for all other systems.  PHYSICAL EXAM BP 122/71  Pulse 80  Ht 6\' 4"  (1.93 m)   Wt 259 lb (117.482 kg)  BMI 31.53 kg/m2 GENERAL:  Well appearing NECK:  No jugular venous distention, waveform within normal limits, carotid upstroke brisk and symmetric, no bruits, no thyromegaly LYMPHATICS:  No cervical, inguinal adenopathy LUNGS:  Clear to auscultation bilaterally CHEST:  Unremarkable HEART:  PMI not displaced or sustained,S1 and S2 within normal limits, no S3, no S4, no clicks, no rubs, no murmurs ABD:  Flat, positive bowel sounds normal in frequency in pitch, no bruits, no rebound, no guarding, no midline pulsatile mass, no hepatomegaly, no splenomegaly, healed surgical scar EXT:  2 plus pulses throughout, mild bilateral edema, no cyanosis no clubbing  EKG:  Sinus rhythm, rate 80 right bundle branch block, left anterior fascicular block. No change from previous. 01/13/2012  ASSESSMENT AND PLAN  CAD (coronary artery disease) - The patient is exercising routinely. He is participating in risk reduction. He has no symptoms. No further testing is indicated at this point.  DYSLIPIDEMIA -  His lipids are excellent and I reviewed these. He will continue the meds as listed.  HYPERTENSION, UNSPECIFIED -  The blood pressure is at target. No change in medications is indicated. We will continue with therapeutic lifestyle changes (TLC).

## 2012-01-13 NOTE — Patient Instructions (Addendum)
The current medical regimen is effective;  continue present plan and medications.  Follow up in 1 year with Dr Hochrein.  You will receive a letter in the mail 2 months before you are due.  Please call us when you receive this letter to schedule your follow up appointment.  

## 2012-04-20 ENCOUNTER — Encounter: Payer: Medicare Other | Admitting: Internal Medicine

## 2012-04-20 DIAGNOSIS — C19 Malignant neoplasm of rectosigmoid junction: Secondary | ICD-10-CM

## 2012-05-24 ENCOUNTER — Other Ambulatory Visit: Payer: Self-pay | Admitting: Cardiology

## 2012-06-08 ENCOUNTER — Ambulatory Visit (INDEPENDENT_AMBULATORY_CARE_PROVIDER_SITE_OTHER): Payer: Medicare Other | Admitting: Nurse Practitioner

## 2012-06-08 ENCOUNTER — Encounter: Payer: Self-pay | Admitting: Nurse Practitioner

## 2012-06-08 ENCOUNTER — Telehealth: Payer: Self-pay | Admitting: Cardiology

## 2012-06-08 VITALS — BP 116/67 | HR 56 | Temp 97.0°F | Ht 76.0 in | Wt 254.0 lb

## 2012-06-08 DIAGNOSIS — I10 Essential (primary) hypertension: Secondary | ICD-10-CM

## 2012-06-08 DIAGNOSIS — E785 Hyperlipidemia, unspecified: Secondary | ICD-10-CM

## 2012-06-08 DIAGNOSIS — R609 Edema, unspecified: Secondary | ICD-10-CM

## 2012-06-08 DIAGNOSIS — Z125 Encounter for screening for malignant neoplasm of prostate: Secondary | ICD-10-CM

## 2012-06-08 DIAGNOSIS — Z1212 Encounter for screening for malignant neoplasm of rectum: Secondary | ICD-10-CM

## 2012-06-08 DIAGNOSIS — Z139 Encounter for screening, unspecified: Secondary | ICD-10-CM

## 2012-06-08 MED ORDER — FUROSEMIDE 20 MG PO TABS: 20.0000 mg | ORAL_TABLET | Freq: Every day | ORAL | Status: DC

## 2012-06-08 NOTE — Telephone Encounter (Signed)
I informed Pam, RN that patient is en route to [place] office.

## 2012-06-08 NOTE — Telephone Encounter (Signed)
Returned patient's call.  Patient's wife states the patient is on his way to see Dr. Antoine Poche at the [facility] office.

## 2012-06-08 NOTE — Progress Notes (Signed)
  Subjective:    Patient ID: [name], male    DOB: 03/04/1935, 77 y.o.   MRN: [id number]  Hypertension This is a chronic problem. The current episode started more than 1 year ago. The problem is unchanged. The problem is controlled. Pertinent negatives include no blurred vision, chest pain, headaches, neck pain, orthopnea, palpitations, peripheral edema or shortness of breath. There are no associated agents to hypertension. Risk factors for coronary artery disease include dyslipidemia and male gender. Past treatments include ACE inhibitors, calcium channel blockers, diuretics and beta blockers. The current treatment provides moderate improvement. Hypertensive end-organ damage includes CAD/MI.  Hyperlipidemia This is a chronic problem. The current episode started more than 1 year ago. The problem is controlled. Recent lipid tests were reviewed and are normal. Pertinent negatives include no chest pain, myalgias or shortness of breath. Current antihyperlipidemic treatment includes statins. The current treatment provides significant improvement of lipids. Compliance problems include adherence to exercise.  Risk factors for coronary artery disease include hypertension and male sex.  Allergic Rhinitis Has really been bothering him lately. But hasn't taken any meds for it. Didn't know what to take d/t blood pressure.   Review of Systems  HENT: Positive for congestion, rhinorrhea, sneezing and postnasal drip. Negative for neck pain.   Eyes: Negative for blurred vision.  Respiratory: Negative for cough and shortness of breath.   Cardiovascular: Positive for leg swelling (bil). Negative for chest pain, palpitations and orthopnea.  Musculoskeletal: Negative for myalgias.  Neurological: Negative for headaches.  All other systems reviewed and are negative.       Objective:   Physical Exam  Constitutional: He is oriented to person, place, and time. He appears well-developed and well-nourished.  HENT:   Head: Normocephalic.  Right Ear: External ear normal.  Left Ear: External ear normal.  Nose: Nose normal.  Mouth/Throat: Oropharynx is clear and moist.  Eyes: EOM are normal. Pupils are equal, round, and reactive to light.  Neck: Normal range of motion. Neck supple. No thyromegaly present.  Cardiovascular: Normal rate, regular rhythm, normal heart sounds and intact distal pulses.   No murmur heard. Pulmonary/Chest: Effort normal and breath sounds normal. He has no wheezes. He has no rales.  Abdominal: Soft. Bowel sounds are normal.  Musculoskeletal: Normal range of motion. He exhibits edema (2+ pitting).  Neurological: He is alert and oriented to person, place, and time.  Skin: Skin is warm and dry.  Psychiatric: He has a normal mood and affect. His behavior is normal. Judgment and thought content normal.   BP 116/67  Pulse 56  Temp(Src) 97 F (36.1 C) (Oral)  Ht 6\' 4"  (1.93 m)  Wt 254 lb (115.214 kg)  BMI 30.93 kg/m2        Assessment & Plan:  1. Unspecified essential hypertension Low NA+ diet - COMPLETE METABOLIC PANEL WITH GFR  2. Hyperlipidemia Low fat diet and exercise - NMR Lipoprofile with Lipids  3. Screening PSA (prostate specific antigen) Insurance will not approve PSA screening  4. Peripheral edema Stopped HCTZ and changed to Lasix Elevate legs when sitting - furosemide (LASIX) 20 MG tablet; Take 1 tablet (20 mg total) by mouth daily.  Dispense: 30 tablet; Refill: 3  Mary-Margaret Daphine Deutscher, FNP

## 2012-06-08 NOTE — Telephone Encounter (Signed)
New problem     Pt has a question regarding a new prescription that the NP from his PCP office put him on today that he's concerned about taking

## 2012-06-08 NOTE — Patient Instructions (Signed)
Health Maintenance, Males A healthy lifestyle and preventative care can promote health and wellness.  Maintain regular health, dental, and eye exams.  Eat a healthy diet. Foods like vegetables, fruits, whole grains, low-fat dairy products, and lean protein foods contain the nutrients you need without too many calories. Decrease your intake of foods high in solid fats, added sugars, and salt. Get information about a proper diet from your caregiver, if necessary.  Regular physical exercise is one of the most important things you can do for your health. Most adults should get at least 150 minutes of moderate-intensity exercise (any activity that increases your heart rate and causes you to sweat) each week. In addition, most adults need muscle-strengthening exercises on 2 or more days a week.   Maintain a healthy weight. The body mass index (BMI) is a screening tool to identify possible weight problems. It provides an estimate of body fat based on height and weight. Your caregiver can help determine your BMI, and can help you achieve or maintain a healthy weight. For adults 20 years and older:  A BMI below 18.5 is considered underweight.  A BMI of 18.5 to 24.9 is normal.  A BMI of 25 to 29.9 is considered overweight.  A BMI of 30 and above is considered obese.  Maintain normal blood lipids and cholesterol by exercising and minimizing your intake of saturated fat. Eat a balanced diet with plenty of fruits and vegetables. Blood tests for lipids and cholesterol should begin at age 20 and be repeated every 5 years. If your lipid or cholesterol levels are high, you are over 50, or you are a high risk for heart disease, you may need your cholesterol levels checked more frequently.Ongoing high lipid and cholesterol levels should be treated with medicines, if diet and exercise are not effective.  If you smoke, find out from your caregiver how to quit. If you do not use tobacco, do not start.  If you  choose to drink alcohol, do not exceed 2 drinks per day. One drink is considered to be 12 ounces (355 mL) of beer, 5 ounces (148 mL) of wine, or 1.5 ounces (44 mL) of liquor.  Avoid use of street drugs. Do not share needles with anyone. Ask for help if you need support or instructions about stopping the use of drugs.  High blood pressure causes heart disease and increases the risk of stroke. Blood pressure should be checked at least every 1 to 2 years. Ongoing high blood pressure should be treated with medicines if weight loss and exercise are not effective.  If you are 45 to 75 years old, ask your caregiver if you should take aspirin to prevent heart disease.  Diabetes screening involves taking a blood sample to check your fasting blood sugar level. This should be done once every 3 years, after age 45, if you are within normal weight and without risk factors for diabetes. Testing should be considered at a younger age or be carried out more frequently if you are overweight and have at least 1 risk factor for diabetes.  Colorectal cancer can be detected and often prevented. Most routine colorectal cancer screening begins at the age of 50 and continues through age 75. However, your caregiver may recommend screening at an earlier age if you have risk factors for colon cancer. On a yearly basis, your caregiver may provide home test kits to check for hidden blood in the stool. Use of a small camera at the end of a tube,   to directly examine the colon (sigmoidoscopy or colonoscopy), can detect the earliest forms of colorectal cancer. Talk to your caregiver about this at age 50, when routine screening begins. Direct examination of the colon should be repeated every 5 to 10 years through age 75, unless early forms of pre-cancerous polyps or small growths are found.  Hepatitis C blood testing is recommended for all people born from 1945 through 1965 and any individual with known risks for hepatitis C.  Healthy  men should no longer receive prostate-specific antigen (PSA) blood tests as part of routine cancer screening. Consult with your caregiver about prostate cancer screening.  Testicular cancer screening is not recommended for adolescents or adult males who have no symptoms. Screening includes self-exam, caregiver exam, and other screening tests. Consult with your caregiver about any symptoms you have or any concerns you have about testicular cancer.  Practice safe sex. Use condoms and avoid high-risk sexual practices to reduce the spread of sexually transmitted infections (STIs).  Use sunscreen with a sun protection factor (SPF) of 30 or greater. Apply sunscreen liberally and repeatedly throughout the day. You should seek shade when your shadow is shorter than you. Protect yourself by wearing long sleeves, pants, a wide-brimmed hat, and sunglasses year round, whenever you are outdoors.  Notify your caregiver of new moles or changes in moles, especially if there is a change in shape or color. Also notify your caregiver if a mole is larger than the size of a pencil eraser.  A one-time screening for abdominal aortic aneurysm (AAA) and surgical repair of large AAAs by sound wave imaging (ultrasonography) is recommended for ages 65 to 75 years who are current or former smokers.  Stay current with your immunizations. Document Released: 07/25/2007 Document Revised: 04/20/2011 Document Reviewed: 06/23/2010 [employer] Patient Information 2013 [employer].  

## 2012-06-09 ENCOUNTER — Other Ambulatory Visit (INDEPENDENT_AMBULATORY_CARE_PROVIDER_SITE_OTHER): Payer: Medicare Other

## 2012-06-09 DIAGNOSIS — I10 Essential (primary) hypertension: Secondary | ICD-10-CM

## 2012-06-09 DIAGNOSIS — E785 Hyperlipidemia, unspecified: Secondary | ICD-10-CM

## 2012-06-09 LAB — COMPLETE METABOLIC PANEL WITH GFR
Albumin: 4.3 g/dL (ref 3.5–5.2)
BUN: 12 mg/dL (ref 6–23)
CO2: 31 mEq/L (ref 19–32)
Calcium: 9.5 mg/dL (ref 8.4–10.5)
Chloride: 98 mEq/L (ref 96–112)
GFR, Est Non African American: >89 mL/min
Glucose, Bld: 94 mg/dL (ref 70–99)
Potassium: 4.3 mEq/L (ref 3.5–5.3)
Sodium: 137 mEq/L (ref 135–145)

## 2012-06-09 NOTE — Progress Notes (Signed)
Patient came in for labs only.

## 2012-06-10 LAB — NMR LIPOPROFILE WITH LIPIDS
HDL Size: 8.9 nm — ABNORMAL LOW (ref >=9.2)
LDL Particle Number: 708 nmol/L (ref <1000)
Large HDL-P: 2.3 umol/L — ABNORMAL LOW (ref >=4.8)
Large VLDL-P: 1.5 nmol/L (ref <=2.7)
Small LDL Particle Number: 454 nmol/L (ref <=527)
VLDL Size: 45 nm (ref <=46.6)

## 2012-06-20 ENCOUNTER — Telehealth: Payer: Self-pay | Admitting: Nurse Practitioner

## 2012-06-20 NOTE — Telephone Encounter (Signed)
Pt aware of results 

## 2012-06-21 ENCOUNTER — Telehealth: Payer: Self-pay | Admitting: Nurse Practitioner

## 2012-06-21 DIAGNOSIS — R600 Localized edema: Secondary | ICD-10-CM

## 2012-06-21 DIAGNOSIS — R609 Edema, unspecified: Secondary | ICD-10-CM

## 2012-06-21 MED ORDER — FUROSEMIDE 20 MG PO TABS: 20.0000 mg | ORAL_TABLET | Freq: Every day | ORAL | Status: DC

## 2012-06-21 NOTE — Telephone Encounter (Signed)
rx ready for pickup 

## 2012-06-21 NOTE — Telephone Encounter (Signed)
Pt aware,script ready.

## 2012-08-08 ENCOUNTER — Encounter: Payer: Medicare Other | Admitting: Internal Medicine

## 2012-09-12 ENCOUNTER — Other Ambulatory Visit: Payer: Self-pay | Admitting: *Deleted

## 2012-09-12 MED ORDER — METOPROLOL TARTRATE 100 MG PO TABS: 100.0000 mg | ORAL_TABLET | Freq: Every day | ORAL | Status: DC

## 2012-09-12 MED ORDER — ATORVASTATIN CALCIUM 20 MG PO TABS: 20.0000 mg | ORAL_TABLET | Freq: Every day | ORAL | Status: DC

## 2012-09-12 MED ORDER — AMLODIPINE BESY-BENAZEPRIL HCL 10-40 MG PO CAPS: 1.0000 | ORAL_CAPSULE | Freq: Every day | ORAL | Status: DC

## 2012-10-20 DIAGNOSIS — R609 Edema, unspecified: Secondary | ICD-10-CM

## 2012-10-20 DIAGNOSIS — C189 Malignant neoplasm of colon, unspecified: Secondary | ICD-10-CM

## 2012-11-28 ENCOUNTER — Telehealth: Payer: Self-pay | Admitting: Cardiology

## 2012-11-28 NOTE — Telephone Encounter (Signed)
New Problem:  Pt states he has questions about his upcoming appt.

## 2012-11-28 NOTE — Telephone Encounter (Signed)
Pt was scheduled for the [facility] office but wanted to be seen in [place] office.  Rescheduled him to the [place] office.  He is aware that if Dr Antoine Poche needs any lab work it will be ordered at that time.

## 2012-12-21 ENCOUNTER — Ambulatory Visit (INDEPENDENT_AMBULATORY_CARE_PROVIDER_SITE_OTHER): Payer: Medicare Other | Admitting: Cardiology

## 2012-12-21 ENCOUNTER — Encounter: Payer: Self-pay | Admitting: Cardiology

## 2012-12-21 VITALS — BP 144/78 | HR 60 | Ht 76.0 in | Wt 260.0 lb

## 2012-12-21 DIAGNOSIS — I2510 Atherosclerotic heart disease of native coronary artery without angina pectoris: Secondary | ICD-10-CM

## 2012-12-21 NOTE — Progress Notes (Signed)
HPI The patient presents for yearly follow up.  Since I saw him last he has done well. He is exercising 5 days per week. With this he denies any chest pressure, neck or arm discomfort. He's not having any palpitations, presyncope or syncope. He has had no shortness of breath, PND or orthopnea. He has had no weight gain or edema.  It must be remembered that he has really had symptoms with his initial diagnosis either.   No Known Allergies  Current Outpatient Prescriptions  Medication Sig Dispense Refill  . amLODipine-benazepril (LOTREL) 10-40 MG per capsule Take 1 capsule by mouth daily.  90 capsule  1  . aspirin 81 MG tablet Take 81 mg by mouth. Take two daily      . atorvastatin (LIPITOR) 20 MG tablet Take 1 tablet (20 mg total) by mouth daily.  90 tablet  1  . hydrochlorothiazide (HYDRODIURIL) 25 MG tablet Take 25 mg by mouth daily.      . metoprolol (LOPRESSOR) 100 MG tablet Take 1 tablet (100 mg total) by mouth daily.  90 tablet  1  . nitroGLYCERIN (NITROSTAT) 0.4 MG SL tablet Place 1 tablet (0.4 mg total) under the tongue every 5 (five) minutes as needed.  25 tablet  11  . aspirin 325 MG tablet Take 325 mg by mouth daily.       No current facility-administered medications for this visit.    Past Medical History  Diagnosis Date  . Hypertension   . History of tobacco use   . Colon cancer     Radiation, chemo, surgery 2011  . CAD (coronary artery disease)     Abnormal stress perfusino study with apical and inferior wall ischemia. LAD had a long 99% stenosis followed by 30% stenosis, first diagonal 25% stenosis, circumflex luminal irregularities, second obtuse marginal 25% stenosis, RCA 40% followed by 40% stenosis. EF 60%. Stenting with 2 bare metal stents by Dr. Excell Seltzer to the LAD.  Marland Kitchen Arthritis     Past Surgical History  Procedure Laterality Date  . Umbilical hernia repair  01/2008    left  . Hemicolectomy  01/10/2010    ROS:  As stated in the HPI and negative for all other  systems.  PHYSICAL EXAM BP 144/78  Pulse 60  Ht 6\' 4"  (1.93 m)  Wt 260 lb (117.935 kg)  BMI 31.66 kg/m2 GENERAL:  Well appearing NECK:  No jugular venous distention, waveform within normal limits, carotid upstroke brisk and symmetric, no bruits, no thyromegaly LYMPHATICS:  No cervical, inguinal adenopathy LUNGS:  Clear to auscultation bilaterally CHEST:  Unremarkable HEART:  PMI not displaced or sustained,S1 and S2 within normal limits, no S3, no S4, no clicks, no rubs, no murmurs ABD:  Flat, positive bowel sounds normal in frequency in pitch, no bruits, no rebound, no guarding, no midline pulsatile mass, no hepatomegaly, no splenomegaly, healed surgical scar EXT:  2 plus pulses throughout, mild bilateral edema, no cyanosis no clubbing  EKG:  Sinus rhythm, rate 62 right bundle branch block, left anterior fascicular block. No change from previous. 12/21/2012  ASSESSMENT AND PLAN  CAD (coronary artery disease) - The patient is exercising routinely. He is participating in risk reduction. He has no symptoms. As a matter of routine screening I will plan on doing an exercise treadmill test when I see him next year.  DYSLIPIDEMIA -  His lipids are excellent and I reviewed these.  The HDL was 47 with an LDL of 40. He will continue the  meds as listed.  HYPERTENSION, UNSPECIFIED -  The blood pressure is at target. No change in medications is indicated. We will continue with therapeutic lifestyle changes (TLC).

## 2012-12-21 NOTE — Patient Instructions (Signed)
The current medical regimen is effective;  continue present plan and medications.  Your physician has requested that you have an exercise tolerance test. For further information please visit [email or web address]. Please also follow instruction sheet, as given.   

## 2012-12-27 ENCOUNTER — Other Ambulatory Visit: Payer: Self-pay

## 2012-12-27 DIAGNOSIS — I2510 Atherosclerotic heart disease of native coronary artery without angina pectoris: Secondary | ICD-10-CM

## 2012-12-27 MED ORDER — NITROGLYCERIN 0.4 MG SL SUBL: 0.4000 mg | SUBLINGUAL_TABLET | SUBLINGUAL | Status: DC | PRN

## 2013-01-11 ENCOUNTER — Telehealth: Payer: Self-pay

## 2013-01-11 ENCOUNTER — Ambulatory Visit: Payer: Medicare Other | Admitting: Cardiology

## 2013-01-11 NOTE — Telephone Encounter (Signed)
patient called about refill for nitro, it was sent on 12/27/12 to [facility] in [place]

## 2013-01-12 ENCOUNTER — Ambulatory Visit: Payer: Medicare Other | Admitting: Cardiology

## 2013-10-06 ENCOUNTER — Other Ambulatory Visit (HOSPITAL_COMMUNITY): Payer: Self-pay | Admitting: Cardiology

## 2013-10-06 DIAGNOSIS — I10 Essential (primary) hypertension: Secondary | ICD-10-CM

## 2013-12-19 ENCOUNTER — Telehealth (HOSPITAL_COMMUNITY): Payer: Self-pay

## 2013-12-19 NOTE — Telephone Encounter (Signed)
Encounter complete. 

## 2013-12-20 ENCOUNTER — Telehealth (HOSPITAL_COMMUNITY): Payer: Self-pay

## 2013-12-20 NOTE — Telephone Encounter (Signed)
Encounter complete. 

## 2013-12-21 ENCOUNTER — Ambulatory Visit (HOSPITAL_COMMUNITY)
Admission: RE | Admit: 2013-12-21 | Discharge: 2013-12-21 | Disposition: A | Discharge status: Home / Self Care | Payer: Medicare Other | Source: Ambulatory Visit | Attending: Cardiology | Admitting: Cardiology

## 2013-12-21 DIAGNOSIS — I2510 Atherosclerotic heart disease of native coronary artery without angina pectoris: Secondary | ICD-10-CM

## 2013-12-21 DIAGNOSIS — I10 Essential (primary) hypertension: Secondary | ICD-10-CM | POA: Diagnosis not present

## 2013-12-21 NOTE — Procedures (Signed)
Exercise Treadmill Test  Test  Exercise Tolerance Test Ordering MD: Marijo File, MD    Unique Test No: 1  Treadmill:  1  Indication for ETT: Stent and PTCA Patency  Contraindication to ETT: No   Stress Modality: exercise - treadmill  Cardiac Imaging Performed: non   Protocol: standard Bruce - maximal  Max BP:  168/106  Max MPHR (bpm):  144 85% MPR (bpm):  122  MPHR obtained (bpm):  137 % MPHR obtained:  95  Reached 85% MPHR (min:sec):   Total Exercise Time (min-sec):  3  Workload in METS:  4.6 Borg Scale: 15  Reason ETT Terminated:  Diastolic HTN and marked SOB    ST Segment Analysis At Rest: NSR, LAFB, RBBB With Exercise: no evidence of significant ST depression  Other Information Arrhythmia:  No Angina during ETT:  absent (0) Quality of ETT:  diagnostic  ETT Interpretation:  normal - no evidence of ischemia by ST analysis  Comments: ETT with poor exercise tolerance (3:00 min); no chest pain but patient did complain of dyspnea; hypertensive BP response; no ST changes; negative adequate ETT.  Kirk Ruths

## 2014-01-10 ENCOUNTER — Telehealth: Payer: Self-pay | Admitting: Cardiology

## 2014-01-10 NOTE — Telephone Encounter (Signed)
Left message for pt to call.

## 2014-01-10 NOTE — Telephone Encounter (Signed)
New message   Pt called to follow up after stress test. He says he received the results but he requests a call back to determine if a follow up appt is needed// please call

## 2014-01-11 NOTE — Telephone Encounter (Signed)
Spoke with pt, he usually sees dr hochrein in [place] once a year, he is not sure if the treadmill test is considered his appointment or if he still needs to be seen. Will forward for dr hochrein's review

## 2014-01-20 NOTE — Telephone Encounter (Signed)
He does not need to be seen.  I can see him again one year.

## 2014-01-22 NOTE — Telephone Encounter (Signed)
Pt. Called and informed of Dr. Rosezella [place] instructions

## 2014-11-30 ENCOUNTER — Telehealth: Payer: Self-pay | Admitting: Cardiology

## 2014-11-30 NOTE — Telephone Encounter (Signed)
Leave message for pt to call back 

## 2014-11-30 NOTE — Telephone Encounter (Signed)
Pt called in stating that he came in last year for a stress test and he wanted to know if he needed to f/u with the doctor again. Please f/u with him  Thanks

## 2014-12-03 NOTE — Telephone Encounter (Signed)
Returned call to patient. Her last year's note following stress test MD wanted to see him in 1 year - scheduled patient for 2 year ROV 12/18/2014 @ 2:15pm in [place]

## 2014-12-03 NOTE — Telephone Encounter (Signed)
Pt is returning Nya's call from last week. Please call  Thanks

## 2014-12-18 ENCOUNTER — Ambulatory Visit (INDEPENDENT_AMBULATORY_CARE_PROVIDER_SITE_OTHER): Payer: Medicare Other | Admitting: Cardiology

## 2014-12-18 VITALS — BP 118/74 | HR 62 | Ht 76.0 in | Wt 249.7 lb

## 2014-12-18 DIAGNOSIS — I2510 Atherosclerotic heart disease of native coronary artery without angina pectoris: Secondary | ICD-10-CM

## 2014-12-18 NOTE — Progress Notes (Signed)
HPI The patient presents for yearly follow up.  Since I saw him last he has done well. He is exercising 3 days per week despite his orthopedic problems. . With this he denies any chest pressure, neck or arm discomfort. He's not having any palpitations, presyncope or syncope. He has had no shortness of breath, PND or orthopnea. He has had no weight gain or edema.  He had a negative POET ([place]ain [place] Exercise Treadmill) last year.   No Known Allergies  Current Outpatient Prescriptions  Medication Sig Dispense Refill  . amLODipine-benazepril (LOTREL) 10-40 MG per capsule Take 1 capsule by mouth daily. 90 capsule 1  . aspirin 81 MG tablet Take 81 mg by mouth. Take two daily    . atorvastatin (LIPITOR) 20 MG tablet Take 1 tablet (20 mg total) by mouth daily. 90 tablet 1  . Boron 6 MG TABS Take 6 mg by mouth daily.    . Ferrous Gluconate (IRON) 240 (27 FE) MG TABS Take 1 tablet by mouth daily.    . hydrochlorothiazide (HYDRODIURIL) 25 MG tablet Take 25 mg by mouth daily.    . Lutein 20 MG CAPS Take 1 capsule by mouth daily.    . Lycopene 10 MG CAPS Take 1 capsule by mouth daily.    . Magnesium 250 MG TABS Take 1 tablet by mouth daily.    . metoprolol (LOPRESSOR) 100 MG tablet Take 1 tablet (100 mg total) by mouth daily. 90 tablet 1  . Misc Natural Products (CHLORELLA) 500 MG CAPS Take 2 capsules by mouth 2 (two) times daily.    . nitroGLYCERIN (NITROSTAT) 0.4 MG SL tablet Place 1 tablet (0.4 mg total) under the tongue every 5 (five) minutes as needed. 25 tablet 3  . Omega 3-6-9 Fatty Acids (OMEGA 3-6-9 COMPLEX) CAPS Take 1 capsule by mouth 2 (two) times daily.    . Vitamin D, Cholecalciferol, 1000 UNITS TABS Take 1,000 Units by mouth 2 (two) times daily.    Marland Kitchen zinc gluconate 50 MG tablet Take 50 mg by mouth daily.     No current facility-administered medications for this visit.    Past Medical History  Diagnosis Date  . Hypertension   . History of tobacco use   . Colon cancer     Radiation,  chemo, surgery 2011  . CAD (coronary artery disease)     Abnormal stress perfusion study with apical and inferior wall ischemia. LAD had a long 99% stenosis followed by 30% stenosis, first diagonal 25% stenosis, circumflex luminal irregularities, second obtuse marginal 25% stenosis, RCA 40% followed by 40% stenosis. EF 60%. Stenting with 2 bare metal stents by Dr. Burt Knack to the LAD.  Marland Kitchen Arthritis     Past Surgical History  Procedure Laterality Date  . Umbilical hernia repair  01/2008    left  . Hemicolectomy  01/10/2010    ROS:  As stated in the HPI and negative for all other systems.  PHYSICAL EXAM BP 118/74 mmHg  Pulse 62  Ht 6\' 4"  (1.93 m)  Wt 249 lb 11.2 oz (113.263 kg)  BMI 30.41 kg/m2 GENERAL:  Well appearing NECK:  No jugular venous distention, waveform within normal limits, carotid upstroke brisk and symmetric, no bruits, no thyromegaly LYMPHATICS:  No cervical, inguinal adenopathy LUNGS:  Clear to auscultation bilaterally CHEST:  Unremarkable HEART:  PMI not displaced or sustained,S1 and S2 within normal limits, no S3, no S4, no clicks, no rubs, no murmurs ABD:  Flat, positive bowel sounds normal in  frequency in pitch, no bruits, no rebound, no guarding, no midline pulsatile mass, no hepatomegaly, no splenomegaly, healed surgical scar EXT:  2 plus pulses throughout, mild bilateral edema, no cyanosis no clubbing  EKG:  Sinus rhythm, rate 62 right bundle branch block, left anterior fascicular block. No change from previous. 12/18/2014  ASSESSMENT AND PLAN  CAD (coronary artery disease) - The patient is exercising routinely. He is participating in risk reduction. He has no symptoms. No further testing is indicated.   DYSLIPIDEMIA -  He is going to have these checked by his primary care physician soon I'm happy to review these.  HYPERTENSION, UNSPECIFIED -  The blood pressure is at target. No change in medications is indicated. We will continue with therapeutic lifestyle  changes (TLC).

## 2014-12-18 NOTE — Patient Instructions (Signed)
Your physician wants you to follow-up in: 1 Year. You will receive a reminder letter in the mail two months in advance. If you don't receive a letter, please call our office to schedule the follow-up appointment.  

## 2014-12-19 ENCOUNTER — Encounter: Payer: Self-pay | Admitting: Cardiology

## 2016-01-15 ENCOUNTER — Telehealth: Payer: Self-pay | Admitting: Cardiology

## 2016-01-15 NOTE — Telephone Encounter (Signed)
Per AVS last OV 12-18-14 follow up in 1 year  Notified Pt, Appt scheduled 01-27-16 @1130 

## 2016-01-15 NOTE — Telephone Encounter (Signed)
New Message  Pt call requesting to speak with RN. Pt would like to be sure he does not need to see Dr. Percival Spanish for a f/u appt this year. Please call back to discuss

## 2016-01-26 NOTE — Progress Notes (Signed)
HPI The patient presents for yearly follow up.  Since I last saw him he has had problems with edema and venous stasis. He's in wound care follow-up at [facility].  He says he's had Dopplers and was told he had venous insufficiency.  He was not wearing compression stockings for a while but he is now. He exercises routinely. He goes to [facility] 3 times per week. The patient denies any new symptoms such as chest discomfort, neck or arm discomfort. There has been no new shortness of breath, PND or orthopnea. There have been no reported palpitations, presyncope or syncope.    Allergies  Allergen Reactions  . Sulfa Antibiotics Other (See Comments)    Patient cannot remember    Current Outpatient Prescriptions  Medication Sig Dispense Refill  . amLODipine-benazepril (LOTREL) 10-40 MG per capsule Take 1 capsule by mouth daily. 90 capsule 1  . aspirin 81 MG tablet Take 81 mg by mouth. Take two daily    . atorvastatin (LIPITOR) 20 MG tablet Take 1 tablet (20 mg total) by mouth daily. 90 tablet 1  . Boron 6 MG TABS Take 6 mg by mouth daily.    . Ferrous Gluconate (IRON) 240 (27 FE) MG TABS Take 1 tablet by mouth daily.    . hydrochlorothiazide (HYDRODIURIL) 25 MG tablet Take 25 mg by mouth daily.    . Lutein 20 MG CAPS Take 1 capsule by mouth daily.    . Lycopene 10 MG CAPS Take 1 capsule by mouth daily.    . Magnesium 250 MG TABS Take 1 tablet by mouth daily.    . metoprolol (LOPRESSOR) 100 MG tablet Take 1 tablet (100 mg total) by mouth daily. 90 tablet 1  . Misc Natural Products (CHLORELLA) 500 MG CAPS Take 2 capsules by mouth 2 (two) times daily.    . nitroGLYCERIN (NITROSTAT) 0.4 MG SL tablet Place 1 tablet (0.4 mg total) under the tongue every 5 (five) minutes as needed. 25 tablet 3  . Omega 3-6-9 Fatty Acids (OMEGA 3-6-9 COMPLEX) CAPS Take 1 capsule by mouth 2 (two) times daily.    . Vitamin D, Cholecalciferol, 1000 UNITS TABS Take 1,000 Units by mouth 2 (two) times daily.    Marland Kitchen zinc gluconate 50  MG tablet Take 50 mg by mouth daily.     No current facility-administered medications for this visit.     Past Medical History:  Diagnosis Date  . Arthritis   . CAD (coronary artery disease)    Abnormal stress perfusion study with apical and inferior wall ischemia. LAD had a long 99% stenosis followed by 30% stenosis, first diagonal 25% stenosis, circumflex luminal irregularities, second obtuse marginal 25% stenosis, RCA 40% followed by 40% stenosis. EF 60%. Stenting with 2 bare metal stents by Dr. Burt Knack to the LAD.  Marland Kitchen Colon cancer [facility])    Radiation, chemo, surgery 2011  . History of tobacco use   . Hypertension     Past Surgical History:  Procedure Laterality Date  . HEMICOLECTOMY  01/10/2010  . UMBILICAL HERNIA REPAIR  01/2008   left    ROS:    As stated in the HPI and negative for all other systems.  PHYSICAL EXAM BP 140/72   Pulse (!) 130   Ht 6\' 4"  (1.93 m)   Wt 253 lb 9.6 oz (115 kg)   BMI 30.87 kg/m  GENERAL:  Well appearing NECK:  No jugular venous distention, waveform within normal limits, carotid upstroke brisk and symmetric, no bruits,  no thyromegaly LYMPHATICS:  No cervical, inguinal adenopathy LUNGS:  Clear to auscultation bilaterally CHEST:  Unremarkable HEART:  PMI not displaced or sustained,S1 and S2 within normal limits, no S3, no S4, no clicks, no rubs, no murmurs ABD:  Flat, positive bowel sounds normal in frequency in pitch, no bruits, no rebound, no guarding, no midline pulsatile mass, no hepatomegaly, no splenomegaly, healed surgical scar EXT:  2 plus pulses throughout, moderat bilateral edema, no cyanosis no clubbing  EKG:  Sinus rhythm, rate 54 right bundle branch block, left anterior fascicular block. No change from previous. 01/27/2016   ASSESSMENT AND PLAN  CAD (coronary artery disease) - The patient is exercising routinely. He is participating in risk reduction. He has no symptoms. No further testing is indicated.   DYSLIPIDEMIA -  He is  going to have these checked by his primary care physician soon I'm happy to review these.   Last year the LDL was 48 and HDL was 57.  No change in therapy is planned.   HYPERTENSION, UNSPECIFIED -  The blood pressure is at target. No change in medications is indicated. We will continue with therapeutic lifestyle changes (TLC).   OBESITY - He has a goal weight for our purposes of 220 and we talked about this.

## 2016-01-27 ENCOUNTER — Encounter: Payer: Self-pay | Admitting: Cardiology

## 2016-01-27 ENCOUNTER — Ambulatory Visit (INDEPENDENT_AMBULATORY_CARE_PROVIDER_SITE_OTHER): Payer: Medicare Other | Admitting: Cardiology

## 2016-01-27 VITALS — BP 140/72 | HR 130 | Ht 76.0 in | Wt 253.6 lb

## 2016-01-27 DIAGNOSIS — I2510 Atherosclerotic heart disease of native coronary artery without angina pectoris: Secondary | ICD-10-CM | POA: Diagnosis not present

## 2016-01-27 DIAGNOSIS — E669 Obesity, unspecified: Secondary | ICD-10-CM

## 2016-01-27 DIAGNOSIS — I10 Essential (primary) hypertension: Secondary | ICD-10-CM

## 2016-01-27 DIAGNOSIS — E66811 Obesity, class 1: Secondary | ICD-10-CM

## 2016-01-27 DIAGNOSIS — M7989 Other specified soft tissue disorders: Secondary | ICD-10-CM

## 2016-01-27 NOTE — Patient Instructions (Signed)
Medication Instructions:  Continue current medications  Labwork: None Ordered  Testing/Procedures: None Ordered  Follow-Up: Your physician wants you to follow-up in: 1 Year. You will receive a reminder letter in the mail two months in advance. If you don't receive a letter, please call our office to schedule the follow-up appointment.   Any Other Special Instructions Will Be Listed Below (If Applicable).         HAPPY HOLIDAY   If you need a refill on your cardiac medications before your next appointment, please call your pharmacy.   

## 2016-05-22 NOTE — Patient Instructions (Signed)
[name]  05/22/2016     @PREFPERIOPPHARMACY @   Your procedure is scheduled on 05/29/2016.  Report to [facility] at 8:40 A.M.  Call this number if you have problems the morning of surgery:  [phone]   Remember:  Do not eat food or drink liquids after midnight.  Take these medicines the morning of surgery with A SIP OF WATER Lotrel, Metoprolol   Do not wear jewelry, make-up or nail polish.  Do not wear lotions, powders, or perfumes, or deoderant.  Do not shave 48 hours prior to surgery.  Men may shave face and neck.  Do not bring valuables to the hospital.  [facility] is not responsible for any belongings or valuables.  Contacts, dentures or bridgework may not be worn into surgery.  Leave your suitcase in the car.  After surgery it may be brought to your room.  For patients admitted to the hospital, discharge time will be determined by your treatment team.  Patients discharged the day of surgery will not be allowed to drive home.    Please read over the following fact sheets that you were given. Anesthesia Post-op Instructions     PATIENT INSTRUCTIONS POST-ANESTHESIA  IMMEDIATELY FOLLOWING SURGERY:  Do not drive or operate machinery for the first twenty four hours after surgery.  Do not make any important decisions for twenty four hours after surgery or while taking narcotic pain medications or sedatives.  If you develop intractable nausea and vomiting or a severe headache please notify your doctor immediately.  FOLLOW-UP:  Please make an appointment with your surgeon as instructed. You do not need to follow up with anesthesia unless specifically instructed to do so.  WOUND CARE INSTRUCTIONS (if applicable):  Keep a dry clean dressing on the anesthesia/puncture wound site if there is drainage.  Once the wound has quit draining you may leave it open to air.  Generally you should leave the bandage intact for twenty four hours unless there is drainage.  If the epidural  site drains for more than 36-48 hours please call the anesthesia department.  QUESTIONS?:  Please feel free to call your physician or the hospital operator if you have any questions, and they will be happy to assist you.      Cataract Surgery Cataract surgery is a procedure to remove a cataract from your eye. A cataract is cloudiness on the lens of your eye. The lens focuses light inside the eye. When a lens becomes cloudy, your vision is affected. Cataract surgery is a procedure to remove the cloudy lens. A substitute lens (intraocular lens or IOL) is usually inserted as a replacement for the cloudy lens. Tell a health care provider about:  Any allergies you have.  All medicines you are taking, including vitamins, herbs, eye drops, creams, and over-the-counter medicines.  Any problems you or family members have had with anesthetic medicines.  Any blood disorders you have.  Any surgeries you have had, especially eye surgeries that include refractive surgery, such as PRK and LASIK.  Any medical conditions you have.  Whether you are pregnant or may be pregnant. What are the risks? Generally, this is a safe procedure. However, problems may occur, including:  Infection.  Bleeding.  Glaucoma.  Retinal detachment.  Allergic reactions to medicines.  Damage to other structures or organs.  Inflammation of the eye.  Clouding of the part of your eye that holds an IOL in place (after-cataract), if an IOL was inserted. This is fairly common.  An IOL moving out of position, if an IOL was inserted. This is very rare.  Loss of vision. This is rare. What happens before the procedure?  Follow instructions from your health care provider about eating or drinking restrictions.  Ask your health care provider about:  Changing or stopping your regular medicines, including any eye drops you have been prescribed. This is especially important if you are taking diabetes medicines or blood  thinners.  Taking medicines such as aspirin and ibuprofen. These medicines can thin your blood. Do not take these medicines before your procedure if your health care provider instructs you not to.  Do not put contact lenses in either eye on the day of your surgery.  Plan for someone to drive you to and from the procedure.  If you will be going home right after the procedure, plan to have someone with you for 24 hours. What happens during the procedure?  An IV tube may be inserted into one of your veins.  You will be given one or more of the following:  A medicine to help you relax (sedative).  A medicine to numb the area (local anesthetic). This may be numbing eye drops or an injection that is given behind the eye.  A small cut (incision) will be made to the edge of the clear, dome-shaped surface that covers the front of the eye (cornea).  A small probe will be inserted into the eye. This device gives off ultrasound waves that soften and break up the cloudy center of the lens. This makes it easier for the cloudy lens to be removed by suction.  An IOL may be implanted.  Part of the capsule that surrounds the lens will be left in the eye to support the IOL.  Your surgeon may use stitches (sutures) to close the incision. The procedure may vary among health care providers and hospitals. What happens after the procedure?  Your blood pressure, heart rate, breathing rate, and blood oxygen level will be monitored often until the medicines you were given have worn off.  You may be given a protective shield to wear over your eyes.  Do not drive for 24 hours if you received a sedative. This information is not intended to replace advice given to you by your health care provider. Make sure you discuss any questions you have with your health care provider. Document Released: 01/15/2011 Document Revised: 07/04/2015 Document Reviewed: 12/06/2014 Elsevier Interactive Patient Education  2017  [employer].

## 2016-05-25 ENCOUNTER — Encounter (HOSPITAL_COMMUNITY)
Admission: RE | Admit: 2016-05-25 | Discharge: 2016-05-25 | Disposition: A | Discharge status: Home / Self Care | Payer: Medicare Other | Source: Ambulatory Visit | Attending: Ophthalmology | Admitting: Ophthalmology

## 2016-05-25 ENCOUNTER — Encounter (HOSPITAL_COMMUNITY): Payer: Self-pay

## 2016-05-25 DIAGNOSIS — Z01812 Encounter for preprocedural laboratory examination: Secondary | ICD-10-CM | POA: Insufficient documentation

## 2016-05-25 HISTORY — DX: Other complications of anesthesia, initial encounter: T88.59XA

## 2016-05-25 HISTORY — DX: Adverse effect of unspecified anesthetic, initial encounter: T41.45XA

## 2016-05-25 LAB — CBC
HCT: 41.6 % (ref 39.0–52.0)
Hemoglobin: 13.7 g/dL (ref 13.0–17.0)
MCH: 30.8 pg (ref 26.0–34.0)
MCHC: 32.9 g/dL (ref 30.0–36.0)
MCV: 93.5 fL (ref 78.0–100.0)
PLATELETS: 262 10*3/uL (ref 150–400)
RBC: 4.45 MIL/uL (ref 4.22–5.81)
RDW: 13.6 % (ref 11.5–15.5)
WBC: 6.3 10*3/uL (ref 4.0–10.5)

## 2016-05-25 LAB — BASIC METABOLIC PANEL
Anion gap: 8 (ref 5–15)
BUN: 20 mg/dL (ref 6–20)
CO2: 28 mmol/L (ref 22–32)
Calcium: 8.6 mg/dL — ABNORMAL LOW (ref 8.9–10.3)
Chloride: 98 mmol/L — ABNORMAL LOW (ref 101–111)
Creatinine, Ser: 0.91 mg/dL (ref 0.61–1.24)
GFR calc Af Amer: >60 mL/min (ref >60)
GFR calc non Af Amer: >60 mL/min (ref >60)
Glucose, Bld: 116 mg/dL — ABNORMAL HIGH (ref 65–99)
Potassium: 3.6 mmol/L (ref 3.5–5.1)
Sodium: 134 mmol/L — ABNORMAL LOW (ref 135–145)

## 2016-05-29 ENCOUNTER — Ambulatory Visit (HOSPITAL_COMMUNITY)
Admission: RE | Admit: 2016-05-29 | Discharge: 2016-05-29 | Disposition: A | Discharge status: Home / Self Care | Payer: Medicare Other | Source: Ambulatory Visit | Attending: Ophthalmology | Admitting: Ophthalmology

## 2016-05-29 ENCOUNTER — Ambulatory Visit (HOSPITAL_COMMUNITY): Payer: Medicare Other | Admitting: Anesthesiology

## 2016-05-29 ENCOUNTER — Encounter (HOSPITAL_COMMUNITY): Payer: Self-pay | Admitting: *Deleted

## 2016-05-29 ENCOUNTER — Encounter (HOSPITAL_COMMUNITY)
Admission: RE | Disposition: A | Discharge status: Home / Self Care | Payer: Self-pay | Source: Ambulatory Visit | Attending: Ophthalmology

## 2016-05-29 DIAGNOSIS — K219 Gastro-esophageal reflux disease without esophagitis: Secondary | ICD-10-CM | POA: Insufficient documentation

## 2016-05-29 DIAGNOSIS — H2512 Age-related nuclear cataract, left eye: Secondary | ICD-10-CM | POA: Insufficient documentation

## 2016-05-29 DIAGNOSIS — I10 Essential (primary) hypertension: Secondary | ICD-10-CM | POA: Diagnosis not present

## 2016-05-29 DIAGNOSIS — Z79899 Other long term (current) drug therapy: Secondary | ICD-10-CM | POA: Diagnosis not present

## 2016-05-29 DIAGNOSIS — Z955 Presence of coronary angioplasty implant and graft: Secondary | ICD-10-CM | POA: Insufficient documentation

## 2016-05-29 DIAGNOSIS — Z87891 Personal history of nicotine dependence: Secondary | ICD-10-CM | POA: Insufficient documentation

## 2016-05-29 DIAGNOSIS — I2510 Atherosclerotic heart disease of native coronary artery without angina pectoris: Secondary | ICD-10-CM | POA: Diagnosis not present

## 2016-05-29 HISTORY — PX: CATARACT EXTRACTION W/PHACO: SHX586

## 2016-05-29 SURGERY — PHACOEMULSIFICATION, CATARACT, WITH IOL INSERTION
Anesthesia: Monitor Anesthesia Care | Site: Eye | Laterality: Left

## 2016-05-29 MED ORDER — FENTANYL CITRATE (PF) 100 MCG/2ML IJ SOLN
INTRAMUSCULAR | Status: AC
  Filled 2016-05-29: qty 2

## 2016-05-29 MED ORDER — POVIDONE-IODINE 5 % OP SOLN
OPHTHALMIC | Status: DC | PRN
  Administered 2016-05-29: 1 via OPHTHALMIC

## 2016-05-29 MED ORDER — SODIUM HYALURONATE 23 MG/ML IO SOLN
INTRAOCULAR | Status: DC | PRN
Start: 2016-05-29 — End: 2016-05-29
  Administered 2016-05-29: 0.6 mL via INTRAOCULAR

## 2016-05-29 MED ORDER — NEOMYCIN-POLYMYXIN-DEXAMETH 3.5-10000-0.1 OP SUSP
OPHTHALMIC | Status: DC | PRN
  Administered 2016-05-29: 2 [drp] via OPHTHALMIC

## 2016-05-29 MED ORDER — TETRACAINE HCL 0.5 % OP SOLN
1.0000 [drp] | OPHTHALMIC | Status: AC
  Administered 2016-05-29 (×3): 1 [drp] via OPHTHALMIC

## 2016-05-29 MED ORDER — MIDAZOLAM HCL 2 MG/2ML IJ SOLN
1.0000 mg | INTRAMUSCULAR | Status: AC
  Administered 2016-05-29: 2 mg via INTRAVENOUS

## 2016-05-29 MED ORDER — PHENYLEPHRINE HCL 2.5 % OP SOLN
1.0000 [drp] | OPHTHALMIC | Status: AC
  Administered 2016-05-29 (×3): 1 [drp] via OPHTHALMIC

## 2016-05-29 MED ORDER — CYCLOPENTOLATE-PHENYLEPHRINE 0.2-1 % OP SOLN
1.0000 [drp] | OPHTHALMIC | Status: AC
  Administered 2016-05-29 (×3): 1 [drp] via OPHTHALMIC

## 2016-05-29 MED ORDER — FENTANYL CITRATE (PF) 100 MCG/2ML IJ SOLN: 25.0000 ug | Freq: Once | INTRAMUSCULAR | Status: DC

## 2016-05-29 MED ORDER — EPINEPHRINE PF 1 MG/ML IJ SOLN
INTRAOCULAR | Status: DC | PRN
  Administered 2016-05-29: 500 mL

## 2016-05-29 MED ORDER — MIDAZOLAM HCL 2 MG/2ML IJ SOLN
INTRAMUSCULAR | Status: AC
  Filled 2016-05-29: qty 2

## 2016-05-29 MED ORDER — LACTATED RINGERS IV SOLN
INTRAVENOUS | Status: DC
  Administered 2016-05-29: 10:00:00 via INTRAVENOUS

## 2016-05-29 MED ORDER — PROVISC 10 MG/ML IO SOLN
INTRAOCULAR | Status: DC | PRN
  Administered 2016-05-29: 0.85 mL via INTRAOCULAR

## 2016-05-29 MED ORDER — BSS IO SOLN
INTRAOCULAR | Status: DC | PRN
  Administered 2016-05-29: 15 mL

## 2016-05-29 MED ORDER — EPINEPHRINE PF 1 MG/ML IJ SOLN
INTRAOCULAR | Status: DC | PRN
  Administered 2016-05-29: 1 mL via OPHTHALMIC

## 2016-05-29 MED ORDER — LIDOCAINE HCL 3.5 % OP GEL: 1.0000 "application " | Freq: Once | OPHTHALMIC | Status: DC

## 2016-05-29 SURGICAL SUPPLY — 14 items
CLOTH BEACON ORANGE TIMEOUT ST (SAFETY) ×3 IMPLANT
EYE SHIELD UNIVERSAL CLEAR (GAUZE/BANDAGES/DRESSINGS) ×3 IMPLANT
GLOVE BIOGEL PI IND STRL 6.5 (GLOVE) ×1 IMPLANT
GLOVE BIOGEL PI INDICATOR 6.5 (GLOVE) ×2
GLOVE EXAM NITRILE MD LF STRL (GLOVE) ×3 IMPLANT
LENS ALC ACRYL/TECN (Ophthalmic Related) ×3 IMPLANT
NEEDLE HYPO 18GX1.5 BLUNT FILL (NEEDLE) ×3 IMPLANT
PAD ARMBOARD 7.5X6 YLW CONV (MISCELLANEOUS) ×3 IMPLANT
RING MALYGIN (MISCELLANEOUS) IMPLANT
SYR TB 1ML LL NO SAFETY (SYRINGE) ×3 IMPLANT
TAPE SURG TRANSPORE 1 IN (GAUZE/BANDAGES/DRESSINGS) ×1 IMPLANT
TAPE SURGICAL TRANSPORE 1 IN (GAUZE/BANDAGES/DRESSINGS) ×2
VISCOELASTIC ADDITIONAL (OPHTHALMIC RELATED) ×3 IMPLANT
WATER STERILE IRR 250ML POUR (IV SOLUTION) ×3 IMPLANT

## 2016-05-29 NOTE — Transfer of Care (Signed)
Immediate Anesthesia Transfer of Care Note  Patient: [name]  Procedure(s) Performed: Procedure(s) with comments: CATARACT EXTRACTION PHACO AND INTRAOCULAR LENS PLACEMENT (IOC) (Left) - CDE: 10.39  Patient Location: Short Stay  Anesthesia Type:MAC  Level of Consciousness: awake and patient cooperative  Airway & Oxygen Therapy: Patient Spontanous Breathing  Post-op Assessment: Report given to RN, Post -op Vital signs reviewed and stable and Patient moving all extremities  Post vital signs: Reviewed and stable  Last Vitals:  Vitals:   05/29/16 1000 05/29/16 1015  BP: 124/72 121/66  Resp: 17 15  Temp:      Last Pain:  Vitals:   05/29/16 0850  TempSrc: Oral      Patients Stated Pain Goal: 10 (30/07/62 2633)  Complications: No apparent anesthesia complications

## 2016-05-29 NOTE — H&P (Signed)
The H and P was reviewed and updated. The patient was examined.  No changes were found after exam.  The surgical eye was marked.  

## 2016-05-29 NOTE — Anesthesia Postprocedure Evaluation (Signed)
Anesthesia Post Note  Patient: [name]  Procedure(s) Performed: Procedure(s) (LRB): CATARACT EXTRACTION PHACO AND INTRAOCULAR LENS PLACEMENT (IOC) (Left)  Patient location during evaluation: Short Stay Anesthesia Type: MAC Level of consciousness: awake and alert, oriented and patient cooperative Pain management: pain level controlled Vital Signs Assessment: post-procedure vital signs reviewed and stable Respiratory status: spontaneous breathing, nonlabored ventilation and respiratory function stable Cardiovascular status: blood pressure returned to baseline Postop Assessment: no signs of nausea or vomiting Anesthetic complications: no     Last Vitals:  Vitals:   05/29/16 1000 05/29/16 1015  BP: 124/72 121/66  Resp: 17 15  Temp:      Last Pain:  Vitals:   05/29/16 0850  TempSrc: Oral                 Shaye Elling J

## 2016-05-29 NOTE — Op Note (Signed)
Date of procedure: 05/29/16  Pre-operative diagnosis: Visually significant cataract, Left Eye  Post-operative diagnosis: Visually significant cataract, Left Eye  Procedure: Removal of cataract via phacoemulsification and insertion of intra-ocular lens AMO PCB00  +22.5D into the capsular bag of the Left Eye  Attending surgeon: Gerda Diss. Alexanderia Gorby, MD, MA  Anesthesia: MAC, Topical Akten  Complications: None  Estimated Blood Loss: <60m (minimal)  Specimens: None  Implants: As above  Indications:  Visually significant cataract, Left Eye  Procedure:  The patient was seen and identified in the pre-operative area. The operative eye was identified and dilated.  The operative eye was marked.  Topical anesthesia was administered to the operative eye.     The patient was then to the operative suite and placed in the supine position.  A timeout was performed confirming the patient, procedure to be performed, and all other relevant information.   The patient's face was prepped and draped in the usual fashion for intra-ocular surgery.  A lid speculum was placed into the operative eye and the surgical microscope moved into place and focused.  A superotemporal paracentesis was created using a 20 gauge paracentesis blade.  Shugarcaine was injected into the anterior chamber.  Viscoelastic was injected into the anterior chamber.  A temporal clear-corneal main wound incision was created using a 2.47mmicrokeratome.  A continuous curvilinear capsulorrhexis was initiated using an irrigating cystitome and completed using capsulorrhexis forceps.  Hydrodissection and hydrodeliniation were performed.  Viscoelastic was injected into the anterior chamber.  A phacoemulsification handpiece and a chopper as a second instrument were used to remove the nucleus and epinucleus. The irrigation/aspiration handpiece was used to remove any remaining cortical material.   The capsular bag was reinflated with viscoelastic, checked,  and found to be intact.  The intraocular lens was inserted into the capsular bag and dialed into place using a Kuglen hook.  The irrigation/aspiration handpiece was used to remove any remaining viscoelastic.  The clear corneal wound and paracentesis wounds were then hydrated and checked with Weck-Cels to be watertight.  The lid-speculum and drape was removed, and the patient's face was cleaned with a wet and dry 4x4.  Maxitrol was instilled in the eye before a clear shield was taped over the eye. The patient was taken to the post-operative care unit in good condition, having tolerated the procedure well.  Post-Op Instructions: [facility] and will receive all other orders and instructions.

## 2016-05-29 NOTE — Anesthesia Preprocedure Evaluation (Signed)
Anesthesia Evaluation  Patient identified by MRN, date of birth, ID band Patient awake    Reviewed: Allergy & Precautions, NPO status , Patient's Chart, lab work & pertinent test results  Airway Mallampati: II  TM Distance: >3 FB     Dental  (+) Edentulous Upper, Poor Dentition, Missing   Pulmonary former smoker,    breath sounds clear to auscultation       Cardiovascular hypertension, Pt. on medications + CAD and + Cardiac Stents   Rhythm:Regular Rate:Bradycardia     Neuro/Psych    GI/Hepatic neg GERD  ,  Endo/Other    Renal/GU      Musculoskeletal   Abdominal   Peds  Hematology   Anesthesia Other Findings   Reproductive/Obstetrics                             Anesthesia Physical Anesthesia Plan  ASA: III  Anesthesia Plan: MAC   Post-op Pain Management:    Induction: Intravenous  Airway Management Planned: Nasal Cannula  Additional Equipment:   Intra-op Plan:   Post-operative Plan:   Informed Consent: I have reviewed the patients History and Physical, chart, labs and discussed the procedure including the risks, benefits and alternatives for the proposed anesthesia with the patient or authorized representative who has indicated his/her understanding and acceptance.     Plan Discussed with:   Anesthesia Plan Comments:         Anesthesia Quick Evaluation

## 2016-05-29 NOTE — Discharge Instructions (Signed)
Please discharge patient when stable, will follow up today with Dr. Ramsie Ostrander at the [facility] office at 11:45AM.  Leave shield in place until visit.  All paperwork with discharge instructions will be given at the office. ° °

## 2016-06-01 ENCOUNTER — Encounter (HOSPITAL_COMMUNITY): Payer: Self-pay | Admitting: Ophthalmology

## 2016-06-09 ENCOUNTER — Encounter (HOSPITAL_COMMUNITY): Payer: Self-pay

## 2016-06-09 ENCOUNTER — Encounter (HOSPITAL_COMMUNITY)
Admission: RE | Admit: 2016-06-09 | Discharge: 2016-06-09 | Disposition: A | Discharge status: Home / Self Care | Payer: Medicare Other | Source: Ambulatory Visit | Attending: Ophthalmology | Admitting: Ophthalmology

## 2016-06-12 ENCOUNTER — Ambulatory Visit (HOSPITAL_COMMUNITY): Payer: Medicare Other | Admitting: Anesthesiology

## 2016-06-12 ENCOUNTER — Ambulatory Visit (HOSPITAL_COMMUNITY)
Admission: RE | Admit: 2016-06-12 | Discharge: 2016-06-12 | Disposition: A | Discharge status: Home / Self Care | Payer: Medicare Other | Source: Ambulatory Visit | Attending: Ophthalmology | Admitting: Ophthalmology

## 2016-06-12 ENCOUNTER — Encounter (HOSPITAL_COMMUNITY)
Admission: RE | Disposition: A | Discharge status: Home / Self Care | Payer: Self-pay | Source: Ambulatory Visit | Attending: Ophthalmology

## 2016-06-12 ENCOUNTER — Encounter (HOSPITAL_COMMUNITY): Payer: Self-pay | Admitting: *Deleted

## 2016-06-12 DIAGNOSIS — K219 Gastro-esophageal reflux disease without esophagitis: Secondary | ICD-10-CM | POA: Diagnosis not present

## 2016-06-12 DIAGNOSIS — I2510 Atherosclerotic heart disease of native coronary artery without angina pectoris: Secondary | ICD-10-CM | POA: Insufficient documentation

## 2016-06-12 DIAGNOSIS — Z87891 Personal history of nicotine dependence: Secondary | ICD-10-CM | POA: Diagnosis not present

## 2016-06-12 DIAGNOSIS — Z79899 Other long term (current) drug therapy: Secondary | ICD-10-CM | POA: Diagnosis not present

## 2016-06-12 DIAGNOSIS — H2511 Age-related nuclear cataract, right eye: Secondary | ICD-10-CM | POA: Insufficient documentation

## 2016-06-12 DIAGNOSIS — Z955 Presence of coronary angioplasty implant and graft: Secondary | ICD-10-CM | POA: Insufficient documentation

## 2016-06-12 DIAGNOSIS — I10 Essential (primary) hypertension: Secondary | ICD-10-CM | POA: Diagnosis not present

## 2016-06-12 HISTORY — PX: CATARACT EXTRACTION W/PHACO: SHX586

## 2016-06-12 SURGERY — PHACOEMULSIFICATION, CATARACT, WITH IOL INSERTION
Anesthesia: Monitor Anesthesia Care | Laterality: Right

## 2016-06-12 MED ORDER — TETRACAINE HCL 0.5 % OP SOLN
1.0000 [drp] | OPHTHALMIC | Status: AC
  Administered 2016-06-12 (×3): 1 [drp] via OPHTHALMIC

## 2016-06-12 MED ORDER — FENTANYL CITRATE (PF) 100 MCG/2ML IJ SOLN
25.0000 ug | Freq: Once | INTRAMUSCULAR | Status: AC
  Administered 2016-06-12: 25 ug via INTRAVENOUS

## 2016-06-12 MED ORDER — FENTANYL CITRATE (PF) 100 MCG/2ML IJ SOLN
INTRAMUSCULAR | Status: AC
  Filled 2016-06-12: qty 2

## 2016-06-12 MED ORDER — NEOMYCIN-POLYMYXIN-DEXAMETH 3.5-10000-0.1 OP SUSP
OPHTHALMIC | Status: DC | PRN
  Administered 2016-06-12: 2 [drp] via OPHTHALMIC

## 2016-06-12 MED ORDER — POVIDONE-IODINE 5 % OP SOLN
OPHTHALMIC | Status: DC | PRN
  Administered 2016-06-12: 1 via OPHTHALMIC

## 2016-06-12 MED ORDER — CYCLOPENTOLATE-PHENYLEPHRINE 0.2-1 % OP SOLN
1.0000 [drp] | OPHTHALMIC | Status: AC
  Administered 2016-06-12 (×3): 1 [drp] via OPHTHALMIC

## 2016-06-12 MED ORDER — MIDAZOLAM HCL 2 MG/2ML IJ SOLN
INTRAMUSCULAR | Status: AC
  Filled 2016-06-12: qty 2

## 2016-06-12 MED ORDER — SODIUM HYALURONATE 23 MG/ML IO SOLN
INTRAOCULAR | Status: DC | PRN
  Administered 2016-06-12: 0.6 mL via INTRAOCULAR

## 2016-06-12 MED ORDER — MIDAZOLAM HCL 2 MG/2ML IJ SOLN
1.0000 mg | INTRAMUSCULAR | Status: AC
Start: 2016-06-12 — End: 2016-06-12
  Administered 2016-06-12: 2 mg via INTRAVENOUS

## 2016-06-12 MED ORDER — PHENYLEPHRINE HCL 2.5 % OP SOLN
1.0000 [drp] | OPHTHALMIC | Status: AC
  Administered 2016-06-12 (×3): 1 [drp] via OPHTHALMIC

## 2016-06-12 MED ORDER — EPINEPHRINE PF 1 MG/ML IJ SOLN
INTRAOCULAR | Status: DC | PRN
  Administered 2016-06-12: 500 mL

## 2016-06-12 MED ORDER — BSS IO SOLN
INTRAOCULAR | Status: DC | PRN
  Administered 2016-06-12: 15 mL

## 2016-06-12 MED ORDER — LIDOCAINE HCL 3.5 % OP GEL
1.0000 "application " | Freq: Once | OPHTHALMIC | Status: AC
  Administered 2016-06-12: 1 via OPHTHALMIC

## 2016-06-12 MED ORDER — LACTATED RINGERS IV SOLN
INTRAVENOUS | Status: DC
  Administered 2016-06-12: 09:00:00 via INTRAVENOUS

## 2016-06-12 MED ORDER — EPINEPHRINE PF 1 MG/ML IJ SOLN
INTRAMUSCULAR | Status: AC
  Filled 2016-06-12: qty 2

## 2016-06-12 MED ORDER — PROVISC 10 MG/ML IO SOLN
INTRAOCULAR | Status: DC | PRN
  Administered 2016-06-12: 0.85 mL via INTRAOCULAR

## 2016-06-12 MED ORDER — LIDOCAINE HCL (PF) 1 % IJ SOLN
INTRAOCULAR | Status: DC | PRN
  Administered 2016-06-12: 1 mL via OPHTHALMIC

## 2016-06-12 SURGICAL SUPPLY — 18 items
CLOTH BEACON ORANGE TIMEOUT ST (SAFETY) ×3 IMPLANT
EYE SHIELD UNIVERSAL CLEAR (GAUZE/BANDAGES/DRESSINGS) ×3 IMPLANT
GLOVE BIOGEL PI IND STRL 6.5 (GLOVE) ×1 IMPLANT
GLOVE BIOGEL PI IND STRL 7.0 (GLOVE) ×1 IMPLANT
GLOVE BIOGEL PI IND STRL 7.5 (GLOVE) ×1 IMPLANT
GLOVE BIOGEL PI INDICATOR 6.5 (GLOVE) ×2
GLOVE BIOGEL PI INDICATOR 7.0 (GLOVE) ×2
GLOVE BIOGEL PI INDICATOR 7.5 (GLOVE) ×2
GLOVE EXAM NITRILE LRG STRL (GLOVE) IMPLANT
GLOVE EXAM NITRILE MD LF STRL (GLOVE) IMPLANT
NEEDLE HYPO 18GX1.5 BLUNT FILL (NEEDLE) ×3 IMPLANT
PAD ARMBOARD 7.5X6 YLW CONV (MISCELLANEOUS) ×3 IMPLANT
RING MALYGIN (MISCELLANEOUS) IMPLANT
SIGHTPATH CAT PROC W REG LENS (Ophthalmic Related) ×3 IMPLANT
SYR TB 1ML LL NO SAFETY (SYRINGE) ×3 IMPLANT
TAPE TRANSPARENT 1/2IN (GAUZE/BANDAGES/DRESSINGS) ×3 IMPLANT
VISCOELASTIC ADDITIONAL (OPHTHALMIC RELATED) IMPLANT
WATER STERILE IRR 250ML POUR (IV SOLUTION) ×3 IMPLANT

## 2016-06-12 NOTE — Op Note (Signed)
Date of procedure: 06/12/16  Pre-operative diagnosis: Visually significant cataract, Right Eye  Post-operative diagnosis: Visually significant cataract, Right Eye  Procedure: Removal of cataract via phacoemulsification and insertion of intra-ocular lens AMO PCB00  +23.0D into the capsular bag of the Right Eye  Attending surgeon: Gerda Diss. Linden Mikes, MD, MA  Anesthesia: MAC, Topical Akten  Complications: None  Estimated Blood Loss: <26m (minimal)  Specimens: None  Implants: As above  Indications:  Visually significant cataract, Right Eye  Procedure:  The patient was seen and identified in the pre-operative area. The operative eye was identified and dilated.  The operative eye was marked.  Topical anesthesia was administered to the operative eye.     The patient was then to the operative suite and placed in the supine position.  A timeout was performed confirming the patient, procedure to be performed, and all other relevant information.   The patient's face was prepped and draped in the usual fashion for intra-ocular surgery.  A lid speculum was placed into the operative eye and the surgical microscope moved into place and focused.  A superotemporal paracentesis was created using a 20 gauge paracentesis blade.  Shugarcaine was injected into the anterior chamber.  Viscoelastic was injected into the anterior chamber.  A temporal clear-corneal main wound incision was created using a 2.443mmicrokeratome.  A continuous curvilinear capsulorrhexis was initiated using an irrigating cystitome and completed using capsulorrhexis forceps.  Hydrodissection and hydrodeliniation were performed.  Viscoelastic was injected into the anterior chamber.  A phacoemulsification handpiece and a chopper as a second instrument were used to remove the nucleus and epinucleus. The irrigation/aspiration handpiece was used to remove any remaining cortical material.   The capsular bag was reinflated with viscoelastic,  checked, and found to be intact.  The intraocular lens was inserted into the capsular bag and dialed into place using a Kuglen hook.  The irrigation/aspiration handpiece was used to remove any remaining viscoelastic.  The clear corneal wound and paracentesis wounds were then hydrated and checked with Weck-Cels to be watertight.  The lid-speculum and drape was removed, and the patient's face was cleaned with a wet and dry 4x4.  Maxitrol was instilled in the eye before a clear shield was taped over the eye. The patient was taken to the post-operative care unit in good condition, having tolerated the procedure well.  Post-Op Instructions: [facility] and will receive all other orders and instructions.

## 2016-06-12 NOTE — Anesthesia Postprocedure Evaluation (Signed)
Anesthesia Post Note  Patient: [name]  Procedure(s) Performed: Procedure(s) (LRB): CATARACT EXTRACTION PHACO AND INTRAOCULAR LENS PLACEMENT RIGHT EYE CDE= 13.59 (Right)  Patient location during evaluation: Short Stay Anesthesia Type: MAC Level of consciousness: awake and alert Pain management: pain level controlled Vital Signs Assessment: post-procedure vital signs reviewed and stable Respiratory status: spontaneous breathing Cardiovascular status: stable Anesthetic complications: no     Last Vitals:  Vitals:   06/12/16 0825 06/12/16 0931  BP: 118/66 125/69  Pulse:  (!) 57  Resp: 16 16  Temp:  36.5 C    Last Pain:  Vitals:   06/12/16 0931  TempSrc: Oral  PainSc:                  Drucie Opitz

## 2016-06-12 NOTE — Anesthesia Preprocedure Evaluation (Signed)
Anesthesia Evaluation  Patient identified by MRN, date of birth, ID band Patient awake    Reviewed: Allergy & Precautions, NPO status , Patient's Chart, lab work & pertinent test results  Airway Mallampati: II  TM Distance: >3 FB     Dental  (+) Edentulous Upper, Poor Dentition, Missing   Pulmonary former smoker,    breath sounds clear to auscultation       Cardiovascular hypertension, Pt. on medications + CAD and + Cardiac Stents   Rhythm:Regular Rate:Bradycardia     Neuro/Psych    GI/Hepatic neg GERD  ,  Endo/Other    Renal/GU      Musculoskeletal   Abdominal   Peds  Hematology   Anesthesia Other Findings   Reproductive/Obstetrics                             Anesthesia Physical Anesthesia Plan  ASA: III  Anesthesia Plan: MAC   Post-op Pain Management:    Induction: Intravenous  Airway Management Planned: Nasal Cannula  Additional Equipment:   Intra-op Plan:   Post-operative Plan:   Informed Consent: I have reviewed the patients History and Physical, chart, labs and discussed the procedure including the risks, benefits and alternatives for the proposed anesthesia with the patient or authorized representative who has indicated his/her understanding and acceptance.     Plan Discussed with:   Anesthesia Plan Comments:         Anesthesia Quick Evaluation  

## 2016-06-12 NOTE — Transfer of Care (Signed)
Immediate Anesthesia Transfer of Care Note  Patient: [name]  Procedure(s) Performed: Procedure(s) (LRB): CATARACT EXTRACTION PHACO AND INTRAOCULAR LENS PLACEMENT RIGHT EYE CDE= 13.59 (Right)  Patient Location: Shortstay  Anesthesia Type: MAC  Level of Consciousness: awake  Airway & Oxygen Therapy: Patient Spontanous Breathing   Post-op Assessment: Report given to PACU RN, Post -op Vital signs reviewed and stable and Patient moving all extremities  Post vital signs: Reviewed and stable  Complications: No apparent anesthesia complications

## 2016-06-12 NOTE — Discharge Instructions (Signed)
Please discharge patient when stable, will follow up today with Dr. Marisa Hua at the [facility] office at 10:30.  Leave shield in place until visit.  All paperwork with discharge instructions will be given at the office.   Monitored Anesthesia Care, Care After These instructions provide you with information about caring for yourself after your procedure. Your health care provider may also give you more specific instructions. Your treatment has been planned according to current medical practices, but problems sometimes occur. Call your health care provider if you have any problems or questions after your procedure. What can I expect after the procedure? After your procedure, it is common to:  Feel sleepy for several hours.  Feel clumsy and have poor balance for several hours.  Feel forgetful about what happened after the procedure.  Have poor judgment for several hours.  Feel nauseous or vomit.  Have a sore throat if you had a breathing tube during the procedure. Follow these instructions at home: For at least 24 hours after the procedure:    Do not:  Participate in activities in which you could fall or become injured.  Drive.  Use heavy machinery.  Drink alcohol.  Take sleeping pills or medicines that cause drowsiness.  Make important decisions or sign legal documents.  Take care of children on your own.  Rest. Eating and drinking   Follow the diet that is recommended by your health care provider.  If you vomit, drink water, juice, or soup when you can drink without vomiting.  Make sure you have little or no nausea before eating solid foods. General instructions   Have a responsible adult stay with you until you are awake and alert.  Take over-the-counter and prescription medicines only as told by your health care provider.  If you smoke, do not smoke without supervision.  Keep all follow-up visits as told by your health care provider. This is  important. Contact a health care provider if:  You keep feeling nauseous or you keep vomiting.  You feel light-headed.  You develop a rash.  You have a fever. Get help right away if:  You have trouble breathing. This information is not intended to replace advice given to you by your health care provider. Make sure you discuss any questions you have with your health care provider. Document Released: 05/19/2015 Document Revised: 09/18/2015 Document Reviewed: 05/19/2015 Elsevier Interactive Patient Education  2017 [employer].

## 2016-06-12 NOTE — Anesthesia Procedure Notes (Signed)
Procedure Name: MAC Date/Time: 06/12/2016 9:05 AM Performed by: Vista Deck Pre-anesthesia Checklist: Patient identified, Emergency Drugs available, Suction available, Timeout performed and Patient being monitored Patient Re-evaluated:Patient Re-evaluated prior to inductionOxygen Delivery Method: Nasal Cannula

## 2016-06-12 NOTE — H&P (Signed)
The H and P was reviewed and updated. The patient was examined.  No changes were found after exam.  The surgical eye was marked.  

## 2016-06-15 ENCOUNTER — Encounter (HOSPITAL_COMMUNITY): Payer: Self-pay | Admitting: Ophthalmology

## 2017-01-29 ENCOUNTER — Ambulatory Visit: Payer: Medicare Other | Admitting: Cardiology

## 2017-03-23 NOTE — Progress Notes (Signed)
HPI The patient presents for follow up of CAD.  Since I last saw him he has done well.  When I last saw him he had a leg wound that has healed.  He is not been going to the [facility] as much as I would like but he is going to start doing this.  When he does do this about once a week he feels fine. The patient denies any new symptoms such as chest discomfort, neck or arm discomfort. There has been no new shortness of breath, PND or orthopnea. There have been no reported palpitations, presyncope or syncope.   Allergies  Allergen Reactions  . Sulfa Antibiotics Other (See Comments)    Didn't feel right    Current Outpatient Medications  Medication Sig Dispense Refill  . amLODipine-benazepril (LOTREL) 10-40 MG per capsule Take 1 capsule by mouth daily. (Patient taking differently: Take 1 capsule by mouth at bedtime. ) 90 capsule 1  . aspirin 81 MG chewable tablet Chew 81 mg by mouth every evening.    Marland Kitchen atorvastatin (LIPITOR) 20 MG tablet Take 1 tablet (20 mg total) by mouth daily. (Patient taking differently: Take 20 mg by mouth every evening. ) 90 tablet 1  . Boron 6 MG TABS Take 6 mg by mouth daily.    . Ferrous Gluconate (IRON) 240 (27 FE) MG TABS Take 240 mg by mouth daily.     . hydrochlorothiazide (HYDRODIURIL) 25 MG tablet Take 25 mg by mouth daily.    . Lutein 20 MG CAPS Take 20 mg by mouth daily.     . Lycopene 10 MG CAPS Take 10 mg by mouth daily.     . Magnesium 250 MG TABS Take 250 mg by mouth 2 (two) times daily.     . metoprolol (LOPRESSOR) 100 MG tablet Take 1 tablet (100 mg total) by mouth daily. (Patient taking differently: Take 100 mg by mouth at bedtime. ) 90 tablet 1  . Misc Natural Products (CHLORELLA) 500 MG CAPS Take 500 mg by mouth daily.     . naproxen sodium (ANAPROX) 220 MG tablet Take 220 mg by mouth daily as needed (pain).    . nitroGLYCERIN (NITROSTAT) 0.4 MG SL tablet Place 1 tablet (0.4 mg total) under the tongue every 5 (five) minutes as needed. (Patient taking  differently: Place 0.4 mg under the tongue every 5 (five) minutes as needed for chest pain. ) 25 tablet 3  . Omega 3-6-9 Fatty Acids (OMEGA 3-6-9 COMPLEX) CAPS Take 1 capsule by mouth 2 (two) times daily.    Marland Kitchen Resveratrol POWD Take 1 Dose by mouth every evening.    . Vitamin D, Cholecalciferol, 1000 UNITS TABS Take 1,000 Units by mouth 2 (two) times daily.    Marland Kitchen zinc gluconate 50 MG tablet Take 50 mg by mouth at bedtime.      No current facility-administered medications for this visit.     Past Medical History:  Diagnosis Date  . Arthritis   . CAD (coronary artery disease)    Abnormal stress perfusion study with apical and inferior wall ischemia. LAD had a long 99% stenosis followed by 30% stenosis, first diagonal 25% stenosis, circumflex luminal irregularities, second obtuse marginal 25% stenosis, RCA 40% followed by 40% stenosis. EF 60%. Stenting with 2 bare metal stents by Dr. Burt Knack to the LAD.  Marland Kitchen Colon cancer [facility])    Radiation, chemo, surgery 2011  . Complication of anesthesia    Pt states he coughs alot during anesthesia  .  History of tobacco use   . Hypertension     Past Surgical History:  Procedure Laterality Date  . CATARACT EXTRACTION W/PHACO Left 05/29/2016   Procedure: CATARACT EXTRACTION PHACO AND INTRAOCULAR LENS PLACEMENT (IOC);  Surgeon: Baruch Goldmann, MD;  Location: [facility];  Service: Ophthalmology;  Laterality: Left;  CDE: 10.39  . CATARACT EXTRACTION W/PHACO Right 06/12/2016   Procedure: CATARACT EXTRACTION PHACO AND INTRAOCULAR LENS PLACEMENT RIGHT EYE CDE= 13.59;  Surgeon: Baruch Goldmann, MD;  Location: [facility];  Service: Ophthalmology;  Laterality: Right;  right  . CORONARY ANGIOPLASTY WITH STENT PLACEMENT  2008   x 2  . HEMICOLECTOMY  01/10/2010  . INGUINAL HERNIA REPAIR  2012  . PORTACATH PLACEMENT  2011  . portacath removal  2011  . UMBILICAL HERNIA REPAIR  01/2008   left    ROS:    As stated in the HPI and negative for all other systems.  PHYSICAL EXAM BP  (!) 119/54   Pulse (!) 54   Ht 6\' 4"  (1.93 m)   Wt 256 lb (116.1 kg)   BMI 31.16 kg/m   GENERAL:  Well appearing NECK:  No jugular venous distention, waveform within normal limits, carotid upstroke brisk and symmetric, no bruits, no thyromegaly LUNGS:  Clear to auscultation bilaterally CHEST:  Unremarkable HEART:  PMI not displaced or sustained,S1 and S2 within normal limits, no S3, no S4, no clicks, no rubs, no murmurs ABD:  Flat, positive bowel sounds normal in frequency in pitch, no bruits, no rebound, no guarding, no midline pulsatile mass, no hepatomegaly, no splenomegaly EXT:  2 plus pulses throughout, left greater than right mild leg edema, no cyanosis no clubbing   EKG:  Sinus rhythm, rate 54 right bundle branch block, left anterior fascicular block. No change from previous. 03/24/2017   ASSESSMENT AND PLAN  CAD (coronary artery disease) - The patient has no new sypmtoms.  No further cardiovascular testing is indicated.  We will continue with aggressive risk reduction and meds as listed.  DYSLIPIDEMIA -  LDL recently was 51 with an HDL of 48.  He he will continue on the meds as listed.   HYPERTENSION, UNSPECIFIED -  The blood pressure is at target. No change in medications is indicated. We will continue with therapeutic lifestyle changes (TLC).  OBESITY - We have talked about goal weights.   ABNORMAL EKG - We talked about his conduction disturbances and he knows to come to the emergency room or call 911 if he ever has presyncope or syncope.

## 2017-03-24 ENCOUNTER — Ambulatory Visit (INDEPENDENT_AMBULATORY_CARE_PROVIDER_SITE_OTHER): Payer: Medicare Other | Admitting: Cardiology

## 2017-03-24 ENCOUNTER — Encounter: Payer: Self-pay | Admitting: Cardiology

## 2017-03-24 VITALS — BP 119/54 | HR 54 | Ht 76.0 in | Wt 256.0 lb

## 2017-03-24 DIAGNOSIS — E785 Hyperlipidemia, unspecified: Secondary | ICD-10-CM | POA: Diagnosis not present

## 2017-03-24 DIAGNOSIS — I2510 Atherosclerotic heart disease of native coronary artery without angina pectoris: Secondary | ICD-10-CM | POA: Diagnosis not present

## 2017-03-24 DIAGNOSIS — I454 Nonspecific intraventricular block: Secondary | ICD-10-CM

## 2017-03-24 DIAGNOSIS — I10 Essential (primary) hypertension: Secondary | ICD-10-CM

## 2017-03-24 NOTE — Patient Instructions (Signed)

## 2017-05-03 ENCOUNTER — Telehealth: Payer: Self-pay | Admitting: Cardiology

## 2017-05-03 DIAGNOSIS — Z136 Encounter for screening for cardiovascular disorders: Secondary | ICD-10-CM

## 2017-05-03 NOTE — Telephone Encounter (Signed)
Follow Up: ° ° ° ° ° °Returning your call from Friday. °

## 2017-05-03 NOTE — Telephone Encounter (Signed)
-----   Message from Minus Breeding, MD sent at 04/28/2017  3:18 PM EDT ----- Schedule follow up of AAA with ultrasound in one year.

## 2017-05-03 NOTE — Telephone Encounter (Signed)
AAA ordered for 1 year

## 2018-04-18 NOTE — Progress Notes (Signed)
HPI The patient presents for follow up of CAD.  Since I last saw him he has done well.  He has been treated for nonhealing leg wounds which has been recurrent.  He is currently being treated for lymphedema and has had improvement in his swelling.  He has not been at the [facility] because of the leg wounds but he wants to go back.  He walks with 2 canes. The patient denies any new symptoms such as chest discomfort, neck or arm discomfort. There has been no new shortness of breath, PND or orthopnea. There have been no reported palpitations, presyncope or syncope.    Allergies  Allergen Reactions  . Sulfa Antibiotics Other (See Comments)    Didn't feel right    Current Outpatient Medications  Medication Sig Dispense Refill  . amLODipine-benazepril (LOTREL) 10-40 MG per capsule Take 1 capsule by mouth daily. (Patient taking differently: Take 1 capsule by mouth at bedtime. ) 90 capsule 1  . aspirin 81 MG chewable tablet Chew 81 mg by mouth every evening.    Marland Kitchen atorvastatin (LIPITOR) 20 MG tablet Take 1 tablet (20 mg total) by mouth daily. (Patient taking differently: Take 20 mg by mouth every evening. ) 90 tablet 1  . co-enzyme Q-10 50 MG capsule Take 50 mg by mouth daily.    . Ferrous Gluconate (IRON) 240 (27 FE) MG TABS Take 240 mg by mouth daily.     . hydrochlorothiazide (HYDRODIURIL) 25 MG tablet Take 25 mg by mouth daily.    . Magnesium 250 MG TABS Take by mouth.    . metoprolol (LOPRESSOR) 100 MG tablet Take 1 tablet (100 mg total) by mouth daily. (Patient taking differently: Take 100 mg by mouth at bedtime. ) 90 tablet 1  . naproxen sodium (ANAPROX) 220 MG tablet Take 220 mg by mouth daily as needed (pain).    . nitroGLYCERIN (NITROSTAT) 0.4 MG SL tablet Place 1 tablet (0.4 mg total) under the tongue every 5 (five) minutes as needed. (Patient taking differently: Place 0.4 mg under the tongue every 5 (five) minutes as needed for chest pain. ) 25 tablet 3  . Vitamin D, Cholecalciferol, 1000  UNITS TABS Take 1,000 Units by mouth 2 (two) times daily.     No current facility-administered medications for this visit.     Past Medical History:  Diagnosis Date  . Arthritis   . CAD (coronary artery disease)    Abnormal stress perfusion study with apical and inferior wall ischemia. LAD had a long 99% stenosis followed by 30% stenosis, first diagonal 25% stenosis, circumflex luminal irregularities, second obtuse marginal 25% stenosis, RCA 40% followed by 40% stenosis. EF 60%. Stenting with 2 bare metal stents by Dr. Burt Knack to the LAD.  Marland Kitchen Colon cancer [facility])    Radiation, chemo, surgery 2011  . Complication of anesthesia    Pt states he coughs alot during anesthesia  . History of tobacco use   . Hypertension     Past Surgical History:  Procedure Laterality Date  . CATARACT EXTRACTION W/PHACO Left 05/29/2016   Procedure: CATARACT EXTRACTION PHACO AND INTRAOCULAR LENS PLACEMENT (IOC);  Surgeon: Baruch Goldmann, MD;  Location: [facility];  Service: Ophthalmology;  Laterality: Left;  CDE: 10.39  . CATARACT EXTRACTION W/PHACO Right 06/12/2016   Procedure: CATARACT EXTRACTION PHACO AND INTRAOCULAR LENS PLACEMENT RIGHT EYE CDE= 13.59;  Surgeon: Baruch Goldmann, MD;  Location: [facility];  Service: Ophthalmology;  Laterality: Right;  right  . CORONARY ANGIOPLASTY WITH STENT  PLACEMENT  2008   x 2  . HEMICOLECTOMY  01/10/2010  . INGUINAL HERNIA REPAIR  2012  . PORTACATH PLACEMENT  2011  . portacath removal  2011  . UMBILICAL HERNIA REPAIR  01/2008   left    ROS:    As stated in the HPI and negative for all other systems.  PHYSICAL EXAM BP (!) 108/58   Pulse 62   Ht 6\' 4"  (1.93 m)   Wt 258 lb (117 kg)   BMI 31.40 kg/m   GENERAL:  Well appearing NECK:  No jugular venous distention, waveform within normal limits, carotid upstroke brisk and symmetric, no bruits, no thyromegaly LUNGS:  Clear to auscultation bilaterally CHEST:  Unremarkable HEART:  PMI not displaced or sustained,S1 and S2 within  normal limits, no S3, no S4, no clicks, no rubs, no murmurs ABD:  Flat, positive bowel sounds normal in frequency in pitch, no bruits, no rebound, no guarding, no midline pulsatile mass, no hepatomegaly, no splenomegaly EXT:  2 plus pulses throughout, moderate lymphedema, no cyanosis no clubbing   EKG:  Sinus rhythm, rate 62 right bundle branch block, left anterior fascicular block. No change from previous. 04/20/2018   ASSESSMENT AND PLAN  CAD (coronary artery disease) - The patient has no new sypmtoms.  No further cardiovascular testing is indicated.  We will continue with aggressive risk reduction and meds as listed.  DYSLIPIDEMIA -  I do not have the most recent labs that you get me a copy.  Previously his LDL was excellent so at this point I will not change his therapy.   HYPERTENSION, UNSPECIFIED -  The blood pressure is at target. No change in medications is indicated. We will continue with therapeutic lifestyle changes (TLC).  OBESITY - We have talked at length about goals of his weight.   ABNORMAL EKG - We have talked previously about his conduction disturbances he has bifascicular block but no symptoms.  Should he have any syncope or presyncope in the future I would need to know about this.   AAA - This was 3.1 cm on CT last year.  He can have follow-up ultrasound next year and we did schedule this.

## 2018-04-20 ENCOUNTER — Encounter: Payer: Self-pay | Admitting: Cardiology

## 2018-04-20 ENCOUNTER — Other Ambulatory Visit: Payer: Self-pay

## 2018-04-20 ENCOUNTER — Ambulatory Visit (INDEPENDENT_AMBULATORY_CARE_PROVIDER_SITE_OTHER): Payer: Medicare Other | Admitting: Cardiology

## 2018-04-20 VITALS — BP 108/58 | HR 62 | Ht 76.0 in | Wt 258.0 lb

## 2018-04-20 DIAGNOSIS — I10 Essential (primary) hypertension: Secondary | ICD-10-CM

## 2018-04-20 DIAGNOSIS — I2510 Atherosclerotic heart disease of native coronary artery without angina pectoris: Secondary | ICD-10-CM | POA: Diagnosis not present

## 2018-04-20 DIAGNOSIS — I714 Abdominal aortic aneurysm, without rupture, unspecified: Secondary | ICD-10-CM | POA: Insufficient documentation

## 2018-04-20 DIAGNOSIS — R9431 Abnormal electrocardiogram [ECG] [EKG]: Secondary | ICD-10-CM | POA: Insufficient documentation

## 2018-04-20 DIAGNOSIS — E785 Hyperlipidemia, unspecified: Secondary | ICD-10-CM | POA: Diagnosis not present

## 2018-04-20 NOTE — Patient Instructions (Signed)
Medication Instructions:  The current medical regimen is effective;  continue present plan and medications.  If you need a refill on your cardiac medications before your next appointment, please call your pharmacy.   Follow-Up: Follow up in 1 year with Dr. Hochrein in [place].  You will receive a letter in the mail 2 months before you are due.  Please call us when you receive this letter to schedule your follow up appointment.  Thank you for choosing [facility]!!     

## 2018-04-29 ENCOUNTER — Other Ambulatory Visit (HOSPITAL_COMMUNITY): Payer: Medicare Other

## 2018-11-12 ENCOUNTER — Encounter (HOSPITAL_COMMUNITY): Payer: Self-pay | Admitting: Emergency Medicine

## 2018-11-12 ENCOUNTER — Inpatient Hospital Stay (HOSPITAL_COMMUNITY)
Admission: EM | Admit: 2018-11-12 | Discharge: 2018-11-22 | DRG: 299 | Disposition: A | Discharge status: Skilled Nursing Facility | Payer: Medicare Other | Attending: Internal Medicine | Admitting: Internal Medicine

## 2018-11-12 ENCOUNTER — Other Ambulatory Visit: Payer: Self-pay

## 2018-11-12 DIAGNOSIS — L03116 Cellulitis of left lower limb: Secondary | ICD-10-CM | POA: Diagnosis present

## 2018-11-12 DIAGNOSIS — M5126 Other intervertebral disc displacement, lumbar region: Secondary | ICD-10-CM | POA: Diagnosis present

## 2018-11-12 DIAGNOSIS — R748 Abnormal levels of other serum enzymes: Secondary | ICD-10-CM | POA: Diagnosis present

## 2018-11-12 DIAGNOSIS — M48061 Spinal stenosis, lumbar region without neurogenic claudication: Secondary | ICD-10-CM | POA: Diagnosis present

## 2018-11-12 DIAGNOSIS — I83019 Varicose veins of right lower extremity with ulcer of unspecified site: Secondary | ICD-10-CM | POA: Diagnosis present

## 2018-11-12 DIAGNOSIS — I83029 Varicose veins of left lower extremity with ulcer of unspecified site: Principal | ICD-10-CM | POA: Diagnosis present

## 2018-11-12 DIAGNOSIS — Z7982 Long term (current) use of aspirin: Secondary | ICD-10-CM

## 2018-11-12 DIAGNOSIS — Z79899 Other long term (current) drug therapy: Secondary | ICD-10-CM

## 2018-11-12 DIAGNOSIS — I2510 Atherosclerotic heart disease of native coronary artery without angina pectoris: Secondary | ICD-10-CM | POA: Diagnosis present

## 2018-11-12 DIAGNOSIS — G8929 Other chronic pain: Secondary | ICD-10-CM | POA: Diagnosis present

## 2018-11-12 DIAGNOSIS — R9389 Abnormal findings on diagnostic imaging of other specified body structures: Secondary | ICD-10-CM

## 2018-11-12 DIAGNOSIS — L03115 Cellulitis of right lower limb: Secondary | ICD-10-CM | POA: Diagnosis present

## 2018-11-12 DIAGNOSIS — Z967 Presence of other bone and tendon implants: Secondary | ICD-10-CM

## 2018-11-12 DIAGNOSIS — S32049A Unspecified fracture of fourth lumbar vertebra, initial encounter for closed fracture: Secondary | ICD-10-CM | POA: Diagnosis present

## 2018-11-12 DIAGNOSIS — R7401 Elevation of levels of liver transaminase levels: Secondary | ICD-10-CM

## 2018-11-12 DIAGNOSIS — Z791 Long term (current) use of non-steroidal anti-inflammatories (NSAID): Secondary | ICD-10-CM

## 2018-11-12 DIAGNOSIS — R8271 Bacteriuria: Secondary | ICD-10-CM

## 2018-11-12 DIAGNOSIS — T07XXXA Unspecified multiple injuries, initial encounter: Secondary | ICD-10-CM

## 2018-11-12 DIAGNOSIS — Z961 Presence of intraocular lens: Secondary | ICD-10-CM | POA: Diagnosis present

## 2018-11-12 DIAGNOSIS — Z20828 Contact with and (suspected) exposure to other viral communicable diseases: Secondary | ICD-10-CM | POA: Diagnosis present

## 2018-11-12 DIAGNOSIS — Z882 Allergy status to sulfonamides status: Secondary | ICD-10-CM

## 2018-11-12 DIAGNOSIS — I83009 Varicose veins of unspecified lower extremity with ulcer of unspecified site: Secondary | ICD-10-CM | POA: Diagnosis present

## 2018-11-12 DIAGNOSIS — Z955 Presence of coronary angioplasty implant and graft: Secondary | ICD-10-CM

## 2018-11-12 DIAGNOSIS — R531 Weakness: Secondary | ICD-10-CM | POA: Diagnosis not present

## 2018-11-12 DIAGNOSIS — E43 Unspecified severe protein-calorie malnutrition: Secondary | ICD-10-CM | POA: Diagnosis present

## 2018-11-12 DIAGNOSIS — I872 Venous insufficiency (chronic) (peripheral): Secondary | ICD-10-CM | POA: Diagnosis present

## 2018-11-12 DIAGNOSIS — M2578 Osteophyte, vertebrae: Secondary | ICD-10-CM | POA: Diagnosis present

## 2018-11-12 DIAGNOSIS — R0602 Shortness of breath: Secondary | ICD-10-CM

## 2018-11-12 DIAGNOSIS — L97909 Non-pressure chronic ulcer of unspecified part of unspecified lower leg with unspecified severity: Secondary | ICD-10-CM | POA: Diagnosis present

## 2018-11-12 DIAGNOSIS — Z9842 Cataract extraction status, left eye: Secondary | ICD-10-CM

## 2018-11-12 DIAGNOSIS — R29898 Other symptoms and signs involving the musculoskeletal system: Secondary | ICD-10-CM | POA: Diagnosis present

## 2018-11-12 DIAGNOSIS — I890 Lymphedema, not elsewhere classified: Secondary | ICD-10-CM | POA: Diagnosis present

## 2018-11-12 DIAGNOSIS — I10 Essential (primary) hypertension: Secondary | ICD-10-CM | POA: Diagnosis present

## 2018-11-12 DIAGNOSIS — Z9841 Cataract extraction status, right eye: Secondary | ICD-10-CM

## 2018-11-12 DIAGNOSIS — Z87891 Personal history of nicotine dependence: Secondary | ICD-10-CM

## 2018-11-12 DIAGNOSIS — J9601 Acute respiratory failure with hypoxia: Secondary | ICD-10-CM | POA: Diagnosis not present

## 2018-11-12 DIAGNOSIS — Z85038 Personal history of other malignant neoplasm of large intestine: Secondary | ICD-10-CM

## 2018-11-12 DIAGNOSIS — J189 Pneumonia, unspecified organism: Secondary | ICD-10-CM | POA: Diagnosis present

## 2018-11-12 DIAGNOSIS — L97919 Non-pressure chronic ulcer of unspecified part of right lower leg with unspecified severity: Secondary | ICD-10-CM | POA: Diagnosis present

## 2018-11-12 DIAGNOSIS — L97929 Non-pressure chronic ulcer of unspecified part of left lower leg with unspecified severity: Secondary | ICD-10-CM | POA: Diagnosis present

## 2018-11-12 LAB — COMPREHENSIVE METABOLIC PANEL
ALT: 84 U/L — ABNORMAL HIGH (ref 0–44)
AST: 218 U/L — ABNORMAL HIGH (ref 15–41)
Albumin: 2.5 g/dL — ABNORMAL LOW (ref 3.5–5.0)
Alkaline Phosphatase: 72 U/L (ref 38–126)
Anion gap: 10 (ref 5–15)
BUN: 26 mg/dL — ABNORMAL HIGH (ref 8–23)
CO2: 26 mmol/L (ref 22–32)
Calcium: 8.7 mg/dL — ABNORMAL LOW (ref 8.9–10.3)
Chloride: 98 mmol/L (ref 98–111)
Creatinine, Ser: 1.04 mg/dL (ref 0.61–1.24)
GFR calc Af Amer: >60 mL/min (ref >60)
GFR calc non Af Amer: >60 mL/min (ref >60)
Glucose, Bld: 144 mg/dL — ABNORMAL HIGH (ref 70–99)
Potassium: 3.6 mmol/L (ref 3.5–5.1)
Sodium: 134 mmol/L — ABNORMAL LOW (ref 135–145)
Total Bilirubin: 0.6 mg/dL (ref 0.3–1.2)
Total Protein: 7.2 g/dL (ref 6.5–8.1)

## 2018-11-12 LAB — CBC WITH DIFFERENTIAL/PLATELET
Abs Immature Granulocytes: 0.06 10*3/uL (ref 0.00–0.07)
Basophils Absolute: 0 10*3/uL (ref 0.0–0.1)
Basophils Relative: 0 %
Eosinophils Absolute: 0.6 10*3/uL — ABNORMAL HIGH (ref 0.0–0.5)
Eosinophils Relative: 5 %
HCT: 37.4 % — ABNORMAL LOW (ref 39.0–52.0)
Hemoglobin: 11.9 g/dL — ABNORMAL LOW (ref 13.0–17.0)
Immature Granulocytes: 1 %
Lymphocytes Relative: 5 %
Lymphs Abs: 0.6 10*3/uL — ABNORMAL LOW (ref 0.7–4.0)
MCH: 29.7 pg (ref 26.0–34.0)
MCHC: 31.8 g/dL (ref 30.0–36.0)
MCV: 93.3 fL (ref 80.0–100.0)
Monocytes Absolute: 1 10*3/uL (ref 0.1–1.0)
Monocytes Relative: 8 %
Neutro Abs: 10.2 10*3/uL — ABNORMAL HIGH (ref 1.7–7.7)
Neutrophils Relative %: 81 %
Platelets: 439 10*3/uL — ABNORMAL HIGH (ref 150–400)
RBC: 4.01 MIL/uL — ABNORMAL LOW (ref 4.22–5.81)
RDW: 13 % (ref 11.5–15.5)
WBC: 12.4 10*3/uL — ABNORMAL HIGH (ref 4.0–10.5)
nRBC: 0 % (ref 0.0–0.2)

## 2018-11-12 LAB — LACTIC ACID, PLASMA: Lactic Acid, Venous: 1.4 mmol/L (ref 0.5–1.9)

## 2018-11-12 MED ORDER — SODIUM CHLORIDE 0.9% FLUSH: 3.0000 mL | Freq: Once | INTRAVENOUS | Status: DC

## 2018-11-12 NOTE — ED Triage Notes (Signed)
Pt went to wound center at [facility] for L leg on Thursday.  States while putting dressings on L leg they noticed pt had what they thought were flea bites or chiggers on R leg with drainage.  Pt was admitted over night and discharged on Friday.  Reports bilateral legs gave out last night and pt slept in floor so that they didn't have to wake neighbor up to help get him up.  Called Dr. At [facility] and pt was advised to come to [facility] for possible PVD.

## 2018-11-12 NOTE — ED Notes (Signed)
Pt and wife state that pt had been admitted at [facility], d/c'd on Friday.  Has had falls and progressive weakness in b/l LE as well as weeping and edema in b/l LE.  Pt's wife states they were told to present here to see a vascular specialist.

## 2018-11-13 ENCOUNTER — Observation Stay (HOSPITAL_COMMUNITY): Payer: Medicare Other

## 2018-11-13 ENCOUNTER — Emergency Department (HOSPITAL_COMMUNITY): Payer: Medicare Other

## 2018-11-13 DIAGNOSIS — R29898 Other symptoms and signs involving the musculoskeletal system: Secondary | ICD-10-CM | POA: Diagnosis not present

## 2018-11-13 DIAGNOSIS — D692 Other nonthrombocytopenic purpura: Secondary | ICD-10-CM | POA: Insufficient documentation

## 2018-11-13 DIAGNOSIS — I10 Essential (primary) hypertension: Secondary | ICD-10-CM | POA: Diagnosis not present

## 2018-11-13 DIAGNOSIS — I83009 Varicose veins of unspecified lower extremity with ulcer of unspecified site: Secondary | ICD-10-CM

## 2018-11-13 DIAGNOSIS — I83002 Varicose veins of unspecified lower extremity with ulcer of calf: Secondary | ICD-10-CM | POA: Diagnosis not present

## 2018-11-13 DIAGNOSIS — T07XXXA Unspecified multiple injuries, initial encounter: Secondary | ICD-10-CM | POA: Insufficient documentation

## 2018-11-13 DIAGNOSIS — L97202 Non-pressure chronic ulcer of unspecified calf with fat layer exposed: Secondary | ICD-10-CM

## 2018-11-13 DIAGNOSIS — L97909 Non-pressure chronic ulcer of unspecified part of unspecified lower leg with unspecified severity: Secondary | ICD-10-CM

## 2018-11-13 DIAGNOSIS — R233 Spontaneous ecchymoses: Secondary | ICD-10-CM

## 2018-11-13 DIAGNOSIS — E43 Unspecified severe protein-calorie malnutrition: Secondary | ICD-10-CM

## 2018-11-13 LAB — COMPREHENSIVE METABOLIC PANEL
ALT: 81 U/L — ABNORMAL HIGH (ref 0–44)
AST: 186 U/L — ABNORMAL HIGH (ref 15–41)
Albumin: 2.4 g/dL — ABNORMAL LOW (ref 3.5–5.0)
Alkaline Phosphatase: 69 U/L (ref 38–126)
Anion gap: 11 (ref 5–15)
BUN: 28 mg/dL — ABNORMAL HIGH (ref 8–23)
CO2: 24 mmol/L (ref 22–32)
Calcium: 8.6 mg/dL — ABNORMAL LOW (ref 8.9–10.3)
Chloride: 104 mmol/L (ref 98–111)
Creatinine, Ser: 0.96 mg/dL (ref 0.61–1.24)
GFR calc Af Amer: >60 mL/min (ref >60)
GFR calc non Af Amer: >60 mL/min (ref >60)
Glucose, Bld: 118 mg/dL — ABNORMAL HIGH (ref 70–99)
Potassium: 3.7 mmol/L (ref 3.5–5.1)
Sodium: 139 mmol/L (ref 135–145)
Total Bilirubin: 0.8 mg/dL (ref 0.3–1.2)
Total Protein: 6.9 g/dL (ref 6.5–8.1)

## 2018-11-13 LAB — CBC
HCT: 35.2 % — ABNORMAL LOW (ref 39.0–52.0)
Hemoglobin: 11.4 g/dL — ABNORMAL LOW (ref 13.0–17.0)
MCH: 29.8 pg (ref 26.0–34.0)
MCHC: 32.4 g/dL (ref 30.0–36.0)
MCV: 91.9 fL (ref 80.0–100.0)
Platelets: 410 10*3/uL — ABNORMAL HIGH (ref 150–400)
RBC: 3.83 MIL/uL — ABNORMAL LOW (ref 4.22–5.81)
RDW: 13.1 % (ref 11.5–15.5)
WBC: 12.4 10*3/uL — ABNORMAL HIGH (ref 4.0–10.5)
nRBC: 0 % (ref 0.0–0.2)

## 2018-11-13 LAB — CK: Total CK: 3156 U/L — ABNORMAL HIGH (ref 49–397)

## 2018-11-13 LAB — TSH: TSH: 1.205 u[IU]/mL (ref 0.350–4.500)

## 2018-11-13 LAB — SARS CORONAVIRUS 2 (TAT 6-24 HRS): SARS Coronavirus 2: NEGATIVE

## 2018-11-13 LAB — URINALYSIS, ROUTINE W REFLEX MICROSCOPIC
Bilirubin Urine: NEGATIVE
Glucose, UA: NEGATIVE mg/dL
Ketones, ur: NEGATIVE mg/dL
Leukocytes,Ua: NEGATIVE
Nitrite: NEGATIVE
Protein, ur: 30 mg/dL — AB
RBC / HPF: >50 RBC/hpf — ABNORMAL HIGH (ref 0–5)
Specific Gravity, Urine: 1.024 (ref 1.005–1.030)
pH: 5 (ref 5.0–8.0)

## 2018-11-13 LAB — HEPATITIS PANEL, ACUTE
HCV Ab: NONREACTIVE
Hep A IgM: NONREACTIVE
Hep B C IgM: NONREACTIVE
Hepatitis B Surface Ag: NONREACTIVE

## 2018-11-13 LAB — LACTIC ACID, PLASMA: Lactic Acid, Venous: 1.4 mmol/L (ref 0.5–1.9)

## 2018-11-13 MED ORDER — BENAZEPRIL HCL 40 MG PO TABS
40.0000 mg | ORAL_TABLET | Freq: Every day | ORAL | Status: DC
  Administered 2018-11-13 – 2018-11-14 (×2): 40 mg via ORAL
  Filled 2018-11-13 (×3): qty 1

## 2018-11-13 MED ORDER — ONDANSETRON HCL 4 MG/2ML IJ SOLN: 4.0000 mg | Freq: Four times a day (QID) | INTRAMUSCULAR | Status: DC | PRN

## 2018-11-13 MED ORDER — LYCOPENE 10 MG PO CAPS: 10.0000 mg | ORAL_CAPSULE | Freq: Every day | ORAL | Status: DC

## 2018-11-13 MED ORDER — PIPERACILLIN-TAZOBACTAM 3.375 G IVPB 30 MIN
3.3750 g | Freq: Once | INTRAVENOUS | Status: AC
  Administered 2018-11-13: 3.375 g via INTRAVENOUS
  Filled 2018-11-13: qty 50

## 2018-11-13 MED ORDER — VANCOMYCIN HCL 10 G IV SOLR
2500.0000 mg | INTRAVENOUS | Status: DC
  Administered 2018-11-14 – 2018-11-15 (×2): 2500 mg via INTRAVENOUS
  Filled 2018-11-13 (×2): qty 2500

## 2018-11-13 MED ORDER — ASPIRIN 81 MG PO CHEW
81.0000 mg | CHEWABLE_TABLET | Freq: Every evening | ORAL | Status: DC
  Administered 2018-11-13 – 2018-11-21 (×9): 81 mg via ORAL
  Filled 2018-11-13 (×9): qty 1

## 2018-11-13 MED ORDER — PIPERACILLIN-TAZOBACTAM 3.375 G IVPB
3.3750 g | Freq: Three times a day (TID) | INTRAVENOUS | Status: DC
  Administered 2018-11-13 – 2018-11-16 (×9): 3.375 g via INTRAVENOUS
  Filled 2018-11-13 (×10): qty 50

## 2018-11-13 MED ORDER — VITAMIN D 25 MCG (1000 UNIT) PO TABS
1000.0000 [IU] | ORAL_TABLET | Freq: Two times a day (BID) | ORAL | Status: DC
  Administered 2018-11-13 – 2018-11-22 (×19): 1000 [IU] via ORAL
  Filled 2018-11-13 (×19): qty 1

## 2018-11-13 MED ORDER — ACETAMINOPHEN 650 MG RE SUPP: 650.0000 mg | Freq: Four times a day (QID) | RECTAL | Status: DC | PRN

## 2018-11-13 MED ORDER — VANCOMYCIN HCL 10 G IV SOLR
2000.0000 mg | Freq: Once | INTRAVENOUS | Status: AC
  Administered 2018-11-13: 2000 mg via INTRAVENOUS
  Filled 2018-11-13: qty 2000

## 2018-11-13 MED ORDER — AMLODIPINE BESYLATE 10 MG PO TABS
10.0000 mg | ORAL_TABLET | Freq: Every day | ORAL | Status: DC
  Administered 2018-11-13 – 2018-11-21 (×9): 10 mg via ORAL
  Filled 2018-11-13 (×9): qty 1

## 2018-11-13 MED ORDER — ATORVASTATIN CALCIUM 10 MG PO TABS
20.0000 mg | ORAL_TABLET | Freq: Every evening | ORAL | Status: DC
  Administered 2018-11-13 – 2018-11-21 (×9): 20 mg via ORAL
  Filled 2018-11-13 (×10): qty 2

## 2018-11-13 MED ORDER — FERROUS GLUCONATE 324 (38 FE) MG PO TABS
324.0000 mg | ORAL_TABLET | Freq: Every day | ORAL | Status: DC
  Administered 2018-11-13 – 2018-11-22 (×10): 324 mg via ORAL
  Filled 2018-11-13 (×10): qty 1

## 2018-11-13 MED ORDER — PRO-STAT SUGAR FREE PO LIQD
30.0000 mL | Freq: Two times a day (BID) | ORAL | Status: DC
  Administered 2018-11-13 – 2018-11-22 (×18): 30 mL via ORAL
  Filled 2018-11-13 (×18): qty 30

## 2018-11-13 MED ORDER — NAPROXEN SODIUM 275 MG PO TABS
275.0000 mg | ORAL_TABLET | Freq: Every day | ORAL | Status: DC | PRN
  Filled 2018-11-13: qty 1

## 2018-11-13 MED ORDER — HYDROCHLOROTHIAZIDE 25 MG PO TABS
25.0000 mg | ORAL_TABLET | Freq: Every day | ORAL | Status: DC
  Administered 2018-11-13 – 2018-11-14 (×2): 25 mg via ORAL
  Filled 2018-11-13 (×3): qty 1

## 2018-11-13 MED ORDER — VANCOMYCIN HCL IN DEXTROSE 1-5 GM/200ML-% IV SOLN
1000.0000 mg | Freq: Once | INTRAVENOUS | Status: DC
Start: 2018-11-13 — End: 2018-11-13

## 2018-11-13 MED ORDER — AMLODIPINE BESY-BENAZEPRIL HCL 10-40 MG PO CAPS: 1.0000 | ORAL_CAPSULE | Freq: Every day | ORAL | Status: DC

## 2018-11-13 MED ORDER — ENOXAPARIN SODIUM 40 MG/0.4ML ~~LOC~~ SOLN
40.0000 mg | SUBCUTANEOUS | Status: DC
  Administered 2018-11-13 – 2018-11-21 (×9): 40 mg via SUBCUTANEOUS
  Filled 2018-11-13 (×9): qty 0.4

## 2018-11-13 MED ORDER — ONDANSETRON HCL 4 MG PO TABS
4.0000 mg | ORAL_TABLET | Freq: Four times a day (QID) | ORAL | Status: DC | PRN
  Administered 2018-11-14: 4 mg via ORAL
  Filled 2018-11-13: qty 1

## 2018-11-13 MED ORDER — ACETAMINOPHEN 325 MG PO TABS
650.0000 mg | ORAL_TABLET | Freq: Four times a day (QID) | ORAL | Status: DC | PRN
  Administered 2018-11-16 – 2018-11-21 (×10): 650 mg via ORAL
  Filled 2018-11-13 (×10): qty 2

## 2018-11-13 MED ORDER — METOPROLOL SUCCINATE ER 100 MG PO TB24
100.0000 mg | ORAL_TABLET | Freq: Every day | ORAL | Status: DC
  Administered 2018-11-13 – 2018-11-21 (×9): 100 mg via ORAL
  Filled 2018-11-13 (×9): qty 1

## 2018-11-13 NOTE — ED Notes (Addendum)
Heart Healthy Diet Ordered  

## 2018-11-13 NOTE — Progress Notes (Signed)
Patient ID: [name], male   DOB: [birth year], 83 y.o.   MRN: [id number] BP 116/70   Pulse 86   Temp 98.6 F (37 C) (Oral)   Resp (!) 23   SpO2 97%  Patient seen, examined. No emergent, or urgent surgical needs identified. He has had back pain for 30-40 years by his account. Given the findings in the lower extremities, and the minimal findings on the MRI scan to account for an acute change this is best handled on an outpatient basis.  Moving lower extremities. Did not assess gait, or have patient stand. Normal muscle tone Significantly decreased sensation, clearly not due to neural compression in the spine.   A/p May follow up as outpatient, but has been managed by a chiropractor for many years. Has no desire for surgery, nor is there an etiology seen on the scan for generalized lower extremity weakness.

## 2018-11-13 NOTE — ED Notes (Signed)
Patient transported to X-ray 

## 2018-11-13 NOTE — ED Notes (Signed)
Pt is unable to ambulate. Reports that his legs feel, "Like jello."  Took three staff members to get pt from wheelchair into bed.

## 2018-11-13 NOTE — ED Notes (Signed)
Pt placed on 1L O2 via [place] as sats dropped to 88% on RA while pt sleeping.  Pt denies using CPAP at home.

## 2018-11-13 NOTE — ED Notes (Signed)
I didn't have any success getting patient blood. 

## 2018-11-13 NOTE — Progress Notes (Signed)
MD paged r/t pt questionable chiggers/fleas info. And info from 2 sources stating contact vs. non-contact precautions.  Spoke with CDC and epidemiology-they reccommend a suit until you can speak with the health department [phone]. Charge nurse made aware. Requested staff to inquire about protective equipment for staff. No distress was noted to pt. Pt admitted to unit by staffing. Request for meds from pharmacy.

## 2018-11-13 NOTE — Consult Note (Signed)
[place] Nurse wound consult note Patient receiving care in [place].  Consult was completed remotely after review of record and images.  I have sent a SecureChat message to Attending of record, D. Grandville Silos, asking for a surgical consult for BLE. Reason for Consult: "really bad venous stasis ulcers of legs" Wound type: I am not convinced that what I see in the images is totally related to venous insufficiency.  I am concerned there may be a vascular component that needs further investigation.  I do not think a topical dressing alone will remedy this patient's leg issues. Dressing procedure/placement/frequency:  Wash BLE with soap and water. Pat dry. Sprinkle antifungal powder (available in the green and white bottle in clean utility) over the lower legs. Place Xeroform gauzes over wound areas. Wrap with kerlex.  Change daily and prn. Monitor the wound area(s) for worsening of condition such as: Signs/symptoms of infection,  Increase in size,  Development of or worsening of odor, Development of pain, or increased pain at the affected locations.  Notify the medical team if any of these develop.  Thank you for the consult. [place] nurse will not follow at this time.  Please re-consult the [facility] team if needed.  Val Riles, RN, MSN, CWOCN, CNS-BC, pager [phone]

## 2018-11-13 NOTE — ED Notes (Signed)
Anti fungal powder applied to bilateral lower extremities + non adherent dressing and wrapped w/ Kerlix.

## 2018-11-13 NOTE — ED Provider Notes (Signed)
[place] EMERGENCY DEPARTMENT Provider Note   CSN: [id number] Arrival date & time: 11/12/18  2007     History   Chief Complaint Chief Complaint  Patient presents with  . leg wounds  . Weakness    HPI [name] is a 83 y.o. male.     Patient here with wife.  He has a history of CAD status post stenting, colon cancer, recurrent lymphedema in his lower extremities presenting with weakness, drainage and wounds to his bilateral legs.  States he was hospitalized at [facility] 2 days ago after 1 day stay for worsening wounds to his right leg.  He was sent home with a prescription for Bactrim due to UTI but did not fill this due to sulfa allergy.  After going home patient has had several episodes of unable to stand due to weakness in his legs and unable to walk.  Denies falling or hitting his head.  Patient states his legs "give out underneath him" and is not able to stand or walk.  He normally uses 2 canes for assistance.  His wife called [facility] and was told to come to the [facility] ED with concern for possible peripheral vascular disease and need to see a vascular surgeon.  Patient denies falling or hitting his head.  States he has chronic back pain which is unchanged.  Denies any pain in his legs.  He has draining wounds to his legs bilaterally but states his legs are not painful.  No fever, chills, nausea or vomiting.  No chest pain or shortness of breath.  No history of diabetes.  He reportedly follows at the wound center at [place] [facility]  The history is provided by the patient and the spouse.  Weakness Associated symptoms: arthralgias and myalgias   Associated symptoms: no abdominal pain, no chest pain, no cough, no dizziness, no nausea, no shortness of breath and no vomiting     Past Medical History:  Diagnosis Date  . Arthritis   . CAD (coronary artery disease)    Abnormal stress perfusion study with apical and  inferior wall ischemia. LAD had a long 99% stenosis followed by 30% stenosis, first diagonal 25% stenosis, circumflex luminal irregularities, second obtuse marginal 25% stenosis, RCA 40% followed by 40% stenosis. EF 60%. Stenting with 2 bare metal stents by Dr. Burt Knack to the LAD.  Marland Kitchen Colon cancer [facility])    Radiation, chemo, surgery 2011  . Complication of anesthesia    Pt states he coughs alot during anesthesia  . History of tobacco use   . Hypertension     Patient Active Problem List   Diagnosis Date Noted  . Abnormal EKG 04/20/2018  . Abdominal aortic aneurysm (AAA) without rupture ([place]) 04/20/2018  . Dyslipidemia 03/24/2017  . HTN (hypertension) 06/02/2010  . Arthritis 06/02/2010  . CAD (coronary artery disease) 06/02/2010  . Colon cancer ([place]) 06/02/2010  . Pseudo-gout 06/02/2010  . DYSLIPIDEMIA 12/12/2008  . HYPERTENSION, UNSPECIFIED 04/30/2008  . CAD, UNSPECIFIED SITE 04/30/2008    Past Surgical History:  Procedure Laterality Date  . CATARACT EXTRACTION W/PHACO Left 05/29/2016   Procedure: CATARACT EXTRACTION PHACO AND INTRAOCULAR LENS PLACEMENT (IOC);  Surgeon: Baruch Goldmann, MD;  Location: [facility];  Service: Ophthalmology;  Laterality: Left;  CDE: 10.39  . CATARACT EXTRACTION W/PHACO Right 06/12/2016   Procedure: CATARACT EXTRACTION PHACO AND INTRAOCULAR LENS PLACEMENT RIGHT EYE CDE= 13.59;  Surgeon: Baruch Goldmann, MD;  Location: [facility];  Service: Ophthalmology;  Laterality: Right;  right  . CORONARY ANGIOPLASTY WITH STENT PLACEMENT  2008   x 2  . HEMICOLECTOMY  01/10/2010  . INGUINAL HERNIA REPAIR  2012  . PORTACATH PLACEMENT  2011  . portacath removal  2011  . UMBILICAL HERNIA REPAIR  01/2008   left        Home Medications    Prior to Admission medications   Medication Sig Start Date End Date Taking? Authorizing Provider  amLODipine-benazepril (LOTREL) 10-40 MG per capsule Take 1 capsule by mouth daily. Patient taking differently: Take 1 capsule by mouth at  bedtime.  09/12/12  Yes Minus Breeding, MD  aspirin 81 MG chewable tablet Chew 81 mg by mouth every evening.   Yes [provider]  atorvastatin (LIPITOR) 20 MG tablet Take 1 tablet (20 mg total) by mouth daily. Patient taking differently: Take 20 mg by mouth every evening.  09/12/12  Yes Minus Breeding, MD  Ferrous Gluconate (IRON) 240 (27 FE) MG TABS Take 240 mg by mouth daily.    Yes [provider]  hydrochlorothiazide (HYDRODIURIL) 25 MG tablet Take 25 mg by mouth daily.   Yes [provider]  Lycopene 10 MG CAPS Take 10 mg by mouth daily.   Yes [provider]  metoprolol (LOPRESSOR) 100 MG tablet Take 1 tablet (100 mg total) by mouth daily. Patient taking differently: Take 100 mg by mouth at bedtime.  09/12/12  Yes Minus Breeding, MD  naproxen sodium (ANAPROX) 220 MG tablet Take 220 mg by mouth daily as needed (pain).   Yes [provider]  nitroGLYCERIN (NITROSTAT) 0.4 MG SL tablet Place 1 tablet (0.4 mg total) under the tongue every 5 (five) minutes as needed. Patient taking differently: Place 0.4 mg under the tongue every 5 (five) minutes as needed for chest pain.  12/27/12  Yes Minus Breeding, MD  Vitamin D, Cholecalciferol, 1000 UNITS TABS Take 1,000 Units by mouth 2 (two) times daily.   Yes [provider]    Family History Family History  Adopted: Yes    Social History Social History   Tobacco Use  . Smoking status: Former Smoker    Packs/day: 0.50    Years: 57.00    Pack years: 28.50    Quit date: 2006    Years since quitting: 14.7  . Smokeless tobacco: Never Used  . Tobacco comment: Used to be a 3 pack per day smoker  Substance Use Topics  . Alcohol use: No  . Drug use: No     Allergies   Sulfa antibiotics   Review of Systems Review of Systems  Constitutional: Positive for fatigue. Negative for activity change and appetite change.  HENT: Negative for congestion.   Eyes: Negative for visual disturbance.   Respiratory: Negative for cough, chest tightness and shortness of breath.   Cardiovascular: Negative for chest pain.  Gastrointestinal: Negative for abdominal pain, nausea and vomiting.  Musculoskeletal: Positive for arthralgias, back pain and myalgias.  Skin: Positive for rash and wound.  Neurological: Positive for weakness. Negative for dizziness and light-headedness.    all other systems are negative except as noted in the HPI and PMH.    Physical Exam Updated Vital Signs BP 116/65 (BP Location: Right Arm)   Pulse (!) 102   Temp 98.6 F (37 C) (Oral)   Resp 14   SpO2 94%   Physical Exam Vitals signs and nursing note reviewed.  Constitutional:      General: He is not in acute distress.  Appearance: He is well-developed. He is obese.  HENT:     Head: Normocephalic and atraumatic.     Mouth/Throat:     Pharynx: No oropharyngeal exudate.  Eyes:     Conjunctiva/sclera: Conjunctivae normal.     Pupils: Pupils are equal, round, and reactive to light.  Neck:     Musculoskeletal: Normal range of motion and neck supple.     Comments: No meningismus. Cardiovascular:     Rate and Rhythm: Normal rate and regular rhythm.     Heart sounds: Normal heart sounds. No murmur.  Pulmonary:     Effort: Pulmonary effort is normal. No respiratory distress.     Breath sounds: Normal breath sounds.  Abdominal:     Palpations: Abdomen is soft.     Tenderness: There is no abdominal tenderness. There is no guarding or rebound.  Musculoskeletal:        General: Swelling and tenderness present.     Right lower leg: Edema present.     Left lower leg: Edema present.     Comments: Bilateral lower extremities are edematous with chronic venous stasis changes.  There are several areas of open draining wounds.\ Several areas of hyperkeratosis, venous stasis ulcerations and healing ulcers.  Intact DP pulses bilaterally  Skin:    General: Skin is warm.     Capillary Refill: Capillary refill takes  less than 2 seconds.     Findings: Erythema and rash present.  Neurological:     General: No focal deficit present.     Mental Status: He is alert and oriented to person, place, and time.     Cranial Nerves: No cranial nerve deficit.     Motor: No abnormal muscle tone.     Coordination: Coordination normal.     Comments: Clear nerves II to XII intact, 5/5 strength of upper extremities.  Patient able to bend hips and knees bilaterally.  States unable to raise his legs off the bed bilaterally. Gait not tested Able to wiggle toes.  Able to flex and extend ankles bilaterally.  Dorsiflexion of great toes intact bilaterally. Unable to elicit patellar reflexes bilaterally  Psychiatric:        Behavior: Behavior normal.            ED Treatments / Results  Labs (all labs ordered are listed, but only abnormal results are displayed) Labs Reviewed  COMPREHENSIVE METABOLIC PANEL - Abnormal; Notable for the following components:      Result Value   Sodium 134 (*)    Glucose, Bld 144 (*)    BUN 26 (*)    Calcium 8.7 (*)    Albumin 2.5 (*)    AST 218 (*)    ALT 84 (*)    All other components within normal limits  CBC WITH DIFFERENTIAL/PLATELET - Abnormal; Notable for the following components:   WBC 12.4 (*)    RBC 4.01 (*)    Hemoglobin 11.9 (*)    HCT 37.4 (*)    Platelets 439 (*)    Neutro Abs 10.2 (*)    Lymphs Abs 0.6 (*)    Eosinophils Absolute 0.6 (*)    All other components within normal limits  URINALYSIS, ROUTINE W REFLEX MICROSCOPIC - Abnormal; Notable for the following components:   Hgb urine dipstick LARGE (*)    Protein, ur 30 (*)    RBC / HPF >50 (*)    Bacteria, UA FEW (*)    All other components within normal limits  CK -  Abnormal; Notable for the following components:   Total CK 3,156 (*)    All other components within normal limits  COMPREHENSIVE METABOLIC PANEL - Abnormal; Notable for the following components:   Glucose, Bld 118 (*)    BUN 28 (*)     Calcium 8.6 (*)    Albumin 2.4 (*)    AST 186 (*)    ALT 81 (*)    All other components within normal limits  CBC - Abnormal; Notable for the following components:   WBC 12.4 (*)    RBC 3.83 (*)    Hemoglobin 11.4 (*)    HCT 35.2 (*)    Platelets 410 (*)    All other components within normal limits  SARS CORONAVIRUS 2 (TAT 6-24 HRS)  CULTURE, BLOOD (ROUTINE X 2)  CULTURE, BLOOD (ROUTINE X 2)  URINE CULTURE  LACTIC ACID, PLASMA  LACTIC ACID, PLASMA  TSH  HEPATITIS PANEL, ACUTE    EKG None  Radiology Dg Chest 2 View  Result Date: 11/13/2018 CLINICAL DATA:  Weakness EXAM: CHEST - 2 VIEW COMPARISON:  10/15/2009 FINDINGS: The heart size and mediastinal contours are within normal limits. Both lungs are clear. The visualized skeletal structures are unremarkable. IMPRESSION: No active cardiopulmonary disease. Electronically Signed   By: Ulyses Jarred M.D.   On: 11/13/2018 01:44   Dg Tibia/fibula Left  Result Date: 11/13/2018 CLINICAL DATA:  Left leg skin lesions EXAM: LEFT TIBIA AND FIBULA - 2 VIEW COMPARISON:  None. FINDINGS: No osseous erosion or soft tissue emphysema. There is severe tricompartmental left knee osteoarthrosis. No fracture. IMPRESSION: Severe tricompartmental left knee osteoarthrosis. No soft tissue emphysema. Electronically Signed   By: Ulyses Jarred M.D.   On: 11/13/2018 01:42   Dg Tibia/fibula Right  Result Date: 11/13/2018 CLINICAL DATA:  Leg pain EXAM: RIGHT TIBIA AND FIBULA - 2 VIEW COMPARISON:  None. FINDINGS: Mild tricompartmental osteoarthrosis with chondrocalcinosis in the femorotibial joint space. No knee effusion. No osseous erosion. There are vascular calcifications. IMPRESSION: Mild tricompartmental osteoarthrosis of the right knee. No acute abnormality of the right tibia or fibula. Electronically Signed   By: Ulyses Jarred M.D.   On: 11/13/2018 01:55   Dg Foot Complete Left  Result Date: 11/13/2018 CLINICAL DATA:  Skin lesions EXAM: LEFT FOOT -  COMPLETE 3+ VIEW COMPARISON:  None. FINDINGS: No osseous erosion or soft tissue emphysema. No fracture or dislocation. There are vascular calcifications. The bones are mildly osteopenic. IMPRESSION: No radiographic evidence of osteomyelitis. No fracture or dislocation of the left foot. Electronically Signed   By: Ulyses Jarred M.D.   On: 11/13/2018 01:54   Dg Foot Complete Right  Result Date: 11/13/2018 CLINICAL DATA:  Lower extremity skin lesions EXAM: RIGHT FOOT COMPLETE - 3+ VIEW COMPARISON:  None. FINDINGS: Bones are osteopenic. There is no focal osseous erosion. No fracture or dislocation. Mild midfoot osteoarthrosis. IMPRESSION: 1. No radiographic evidence of osteomyelitis in the right foot. Mild midfoot osteoarthrosis. 2. Osteopenia. Electronically Signed   By: Ulyses Jarred M.D.   On: 11/13/2018 01:55    Procedures Procedures (including critical care time)  Medications Ordered in ED Medications  sodium chloride flush (NS) 0.9 % injection 3 mL (has no administration in time range)     Initial Impression / Assessment and Plan / ED Course  I have reviewed the triage vital signs and the nursing notes.  Pertinent labs & imaging results that were available during my care of the patient were reviewed by me and considered in  my medical decision making (see chart for details).       Acute on chronic lymphedema and leg wounds here with worsening drainage as well as weakness and difficulty standing and walking.  No fever.  Nontoxic-appearing.  Distal pulses intact.  Screening labs are reassuring.  No significant leukocytosis.  No fever.  Blood culture sent.  broad spectrum antibiotics started after cultures obtained for possible infectious component to his leg problem.  Patient unable to raise his legs off the bed.  Says he cannot stand and cannot walk.  This is a change in his baseline.  MRI will be obtained to further evaluate any lumbar spine pathology.  With inability to ambulate and  ongoing component of cellulitis will plan for admission.  X-rays show no evidence of soft tissue gas or osteomyelitis.  Admission d/w Dr. Alcario Drought. MRI L spine pending. May need neurology consult. GBS is on differential but will rule out surgical pathology first.   Final Clinical Impressions(s) / ED Diagnoses   Final diagnoses:  Weakness of both lower extremities  Wounds, multiple  Metal plate in left side of face    ED Discharge Orders    None       Reola Buckles, Annie Main, MD 11/13/18 [id number]

## 2018-11-13 NOTE — H&P (Signed)
History and Physical    [name] [id number] DOB: 02-Feb-1936 DOA: 11/12/2018  PCP: Renne Crigler, NP  Patient coming from: Home  I have personally briefly reviewed patient's old medical records in [place]  Chief Complaint: Weakness  HPI: [name] is a 83 y.o. male with medical history significant of CAD, colon Ca, BLE lymph edema and venous stasis ulcers.  Patient was hospitalized at [facility] x2 days ago for 1 day after worsening wounds to leg.  Sent home with script for bactrim for UTI which he didn't fill due to sulfa allergy.  After going home patient has been unable to stand due to weakness in his legs and unable to walk for the past one day or so.  Has chronic low back pain which is unchanged.  No fever, chills, nausea or vomiting.  No chest pain or shortness of breath.   ED Course: Intact pulses BLE, WBC 12k, extensive venous stasis ulcers BLE.  AST 218 ALT 84.  UA with large HGB >50 RBCs, 6-10 WBCs.  Started on zosyn / vanc for leg wounds / cellulitis.  MRI L spine ordered and pending.   Review of Systems: As per HPI, otherwise all review of systems negative.  Past Medical History:  Diagnosis Date  . Arthritis   . CAD (coronary artery disease)    Abnormal stress perfusion study with apical and inferior wall ischemia. LAD had a long 99% stenosis followed by 30% stenosis, first diagonal 25% stenosis, circumflex luminal irregularities, second obtuse marginal 25% stenosis, RCA 40% followed by 40% stenosis. EF 60%. Stenting with 2 bare metal stents by Dr. Burt Knack to the LAD.  Marland Kitchen Colon cancer [facility])    Radiation, chemo, surgery 2011  . Complication of anesthesia    Pt states he coughs alot during anesthesia  . History of tobacco use   . Hypertension     Past Surgical History:  Procedure Laterality Date  . CATARACT EXTRACTION W/PHACO Left 05/29/2016   Procedure: CATARACT EXTRACTION PHACO AND INTRAOCULAR LENS PLACEMENT (IOC);  Surgeon: Baruch Goldmann, MD;   Location: [facility];  Service: Ophthalmology;  Laterality: Left;  CDE: 10.39  . CATARACT EXTRACTION W/PHACO Right 06/12/2016   Procedure: CATARACT EXTRACTION PHACO AND INTRAOCULAR LENS PLACEMENT RIGHT EYE CDE= 13.59;  Surgeon: Baruch Goldmann, MD;  Location: [facility];  Service: Ophthalmology;  Laterality: Right;  right  . CORONARY ANGIOPLASTY WITH STENT PLACEMENT  2008   x 2  . HEMICOLECTOMY  01/10/2010  . INGUINAL HERNIA REPAIR  2012  . PORTACATH PLACEMENT  2011  . portacath removal  2011  . UMBILICAL HERNIA REPAIR  01/2008   left     reports that he quit smoking about 14 years ago. He has a 28.50 pack-year smoking history. He has never used smokeless tobacco. He reports that he does not drink alcohol or use drugs.  Allergies  Allergen Reactions  . Sulfa Antibiotics Other (See Comments)    Didn't feel right    Family History  Adopted: Yes     Prior to Admission medications   Medication Sig Start Date End Date Taking? Authorizing Provider  amLODipine-benazepril (LOTREL) 10-40 MG per capsule Take 1 capsule by mouth daily. Patient taking differently: Take 1 capsule by mouth at bedtime.  09/12/12  Yes Minus Breeding, MD  aspirin 81 MG chewable tablet Chew 81 mg by mouth every evening.   Yes [provider]  atorvastatin (LIPITOR) 20 MG tablet Take 1 tablet (20 mg total) by mouth  daily. Patient taking differently: Take 20 mg by mouth every evening.  09/12/12  Yes Minus Breeding, MD  Ferrous Gluconate (IRON) 240 (27 FE) MG TABS Take 240 mg by mouth daily.    Yes [provider]  hydrochlorothiazide (HYDRODIURIL) 25 MG tablet Take 25 mg by mouth daily.   Yes [provider]  Lycopene 10 MG CAPS Take 10 mg by mouth daily.   Yes [provider]  metoprolol (LOPRESSOR) 100 MG tablet Take 1 tablet (100 mg total) by mouth daily. Patient taking differently: Take 100 mg by mouth at bedtime.  09/12/12  Yes Minus Breeding, MD  naproxen sodium (ANAPROX) 220 MG tablet  Take 220 mg by mouth daily as needed (pain).   Yes [provider]  nitroGLYCERIN (NITROSTAT) 0.4 MG SL tablet Place 1 tablet (0.4 mg total) under the tongue every 5 (five) minutes as needed. Patient taking differently: Place 0.4 mg under the tongue every 5 (five) minutes as needed for chest pain.  12/27/12  Yes Minus Breeding, MD  Vitamin D, Cholecalciferol, 1000 UNITS TABS Take 1,000 Units by mouth 2 (two) times daily.   Yes [provider]    Physical Exam: Vitals:   11/13/18 0015 11/13/18 0030 11/13/18 0045 11/13/18 0200  BP: 113/73 116/65 134/69 119/64  Pulse: 92 91 96 93  Resp: (!) 25 (!) 23 15 (!) 22  Temp:      TempSrc:      SpO2: 95% 93% 95% 92%    Constitutional: NAD, calm, comfortable Eyes: PERRL, lids and conjunctivae normal ENMT: Mucous membranes are moist. Posterior pharynx clear of any exudate or lesions.Normal dentition.  Neck: normal, supple, no masses, no thyromegaly Respiratory: clear to auscultation bilaterally, no wheezing, no crackles. Normal respiratory effort. No accessory muscle use.  Cardiovascular: Regular rate and rhythm, no murmurs / rubs / gallops. No extremity edema. 2+ pedal pulses. No carotid bruits.  Abdomen: no tenderness, no masses palpated. No hepatosplenomegaly. Bowel sounds positive.  Musculoskeletal: no clubbing / cyanosis. No joint deformity upper and lower extremities. Good ROM, no contractures. Normal muscle tone.  Skin:      Neurologic: dorsiflexion of great toe intact bilaterally, unable to get patellar reflex bilaterally, able to bend knees and hips bilaterally, but unable to raise legs off bed bilaterally Psychiatric: Normal judgment and insight. Alert and oriented x 3. Normal mood.    Labs on Admission: I have personally reviewed following labs and imaging studies  CBC: Recent Labs  Lab 11/12/18 2045  WBC 12.4*  NEUTROABS 10.2*  HGB 11.9*  HCT 37.4*  MCV 93.3  PLT 123456*   Basic Metabolic Panel: Recent  Labs  Lab 11/12/18 2045  NA 134*  K 3.6  CL 98  CO2 26  GLUCOSE 144*  BUN 26*  CREATININE 1.04  CALCIUM 8.7*   GFR: CrCl cannot be calculated (Unknown ideal weight.). Liver Function Tests: Recent Labs  Lab 11/12/18 2045  AST 218*  ALT 84*  ALKPHOS 72  BILITOT 0.6  PROT 7.2  ALBUMIN 2.5*   No results for input(s): LIPASE, AMYLASE in the last 168 hours. No results for input(s): AMMONIA in the last 168 hours. Coagulation Profile: No results for input(s): INR, PROTIME in the last 168 hours. Cardiac Enzymes: No results for input(s): CKTOTAL, CKMB, CKMBINDEX, TROPONINI in the last 168 hours. BNP (last 3 results) No results for input(s): PROBNP in the last 8760 hours. HbA1C: No results for input(s): HGBA1C in the last 72 hours. CBG: No results for input(s):  GLUCAP in the last 168 hours. Lipid Profile: No results for input(s): CHOL, HDL, LDLCALC, TRIG, CHOLHDL, LDLDIRECT in the last 72 hours. Thyroid Function Tests: No results for input(s): TSH, T4TOTAL, FREET4, T3FREE, THYROIDAB in the last 72 hours. Anemia Panel: No results for input(s): VITAMINB12, FOLATE, FERRITIN, TIBC, IRON, RETICCTPCT in the last 72 hours. Urine analysis:    Component Value Date/Time   COLORURINE YELLOW 11/13/2018 0049   APPEARANCEUR CLEAR 11/13/2018 0049   LABSPEC 1.024 11/13/2018 0049   PHURINE 5.0 11/13/2018 0049   GLUCOSEU NEGATIVE 11/13/2018 0049   HGBUR LARGE (A) 11/13/2018 0049   BILIRUBINUR NEGATIVE 11/13/2018 0049   KETONESUR NEGATIVE 11/13/2018 0049   PROTEINUR 30 (A) 11/13/2018 0049   NITRITE NEGATIVE 11/13/2018 0049   LEUKOCYTESUR NEGATIVE 11/13/2018 0049    Radiological Exams on Admission: Dg Chest 2 View  Result Date: 11/13/2018 CLINICAL DATA:  Weakness EXAM: CHEST - 2 VIEW COMPARISON:  10/15/2009 FINDINGS: The heart size and mediastinal contours are within normal limits. Both lungs are clear. The visualized skeletal structures are unremarkable. IMPRESSION: No active  cardiopulmonary disease. Electronically Signed   By: Ulyses Jarred M.D.   On: 11/13/2018 01:44   Dg Tibia/fibula Left  Result Date: 11/13/2018 CLINICAL DATA:  Left leg skin lesions EXAM: LEFT TIBIA AND FIBULA - 2 VIEW COMPARISON:  None. FINDINGS: No osseous erosion or soft tissue emphysema. There is severe tricompartmental left knee osteoarthrosis. No fracture. IMPRESSION: Severe tricompartmental left knee osteoarthrosis. No soft tissue emphysema. Electronically Signed   By: Ulyses Jarred M.D.   On: 11/13/2018 01:42   Dg Tibia/fibula Right  Result Date: 11/13/2018 CLINICAL DATA:  Leg pain EXAM: RIGHT TIBIA AND FIBULA - 2 VIEW COMPARISON:  None. FINDINGS: Mild tricompartmental osteoarthrosis with chondrocalcinosis in the femorotibial joint space. No knee effusion. No osseous erosion. There are vascular calcifications. IMPRESSION: Mild tricompartmental osteoarthrosis of the right knee. No acute abnormality of the right tibia or fibula. Electronically Signed   By: Ulyses Jarred M.D.   On: 11/13/2018 01:55   Dg Foot Complete Left  Result Date: 11/13/2018 CLINICAL DATA:  Skin lesions EXAM: LEFT FOOT - COMPLETE 3+ VIEW COMPARISON:  None. FINDINGS: No osseous erosion or soft tissue emphysema. No fracture or dislocation. There are vascular calcifications. The bones are mildly osteopenic. IMPRESSION: No radiographic evidence of osteomyelitis. No fracture or dislocation of the left foot. Electronically Signed   By: Ulyses Jarred M.D.   On: 11/13/2018 01:54   Dg Foot Complete Right  Result Date: 11/13/2018 CLINICAL DATA:  Lower extremity skin lesions EXAM: RIGHT FOOT COMPLETE - 3+ VIEW COMPARISON:  None. FINDINGS: Bones are osteopenic. There is no focal osseous erosion. No fracture or dislocation. Mild midfoot osteoarthrosis. IMPRESSION: 1. No radiographic evidence of osteomyelitis in the right foot. Mild midfoot osteoarthrosis. 2. Osteopenia. Electronically Signed   By: Ulyses Jarred M.D.   On: 11/13/2018  01:55    EKG: Independently reviewed.  Assessment/Plan Principal Problem:   Bilateral leg weakness Active Problems:   HTN (hypertension)   Venous stasis ulcer (HCC)    1. B leg weakness - new inability to stand or walk over past day or two 1. DDx includes GBS, cord injury, or generalized weakness due to cellulitis 2. MRI L spine pending 3. Check CPK 4. Check TSH 5. May need neuro consult (LP?) vs NS consult depending on results 2. Venous stasis ulcers and cellulitis - 1. Will leave on zosyn / vanc started by EDP for now 2. Wound care consult 3.  No soft tissue gas or osteomyelitis 3. HTN - 1. Continue home BP meds 4. LFT elevation - 1. Hepatitis pnl 2. Checking CPK 3. Repeat CMP in AM 4. Depending on above, may need RUQ US  DVT prophylaxis: Lovenox (to start at 1400 today assuming no surgical issues found on MRI) Code Status: Full Family Communication: No family in room Disposition Plan: Home after admit Consults called: None Admission status: Place in 33     Yasmine Kilbourne, [place] [facility]  How to contact the [facility] Attending or Consulting provider [place] or covering provider during after hours [place], for this patient?  1. Check the care team in [facility] and look for a) attending/consulting [facility] provider listed and b) the [facility] team listed 2. Log into [email or web address]  Amion Physician Scheduling and messaging for groups and whole hospitals  On call and physician scheduling software for group practices, residents, hospitalists and other medical providers for call, clinic, rotation and shift schedules. On[employer] is a hospital-wide system for scheduling doctors and paging doctors on call. EasyPlot is for scientific plotting and data analysis.  [email or web address]  and use 's universal password to access. If you do not have the password, please contact the hospital operator.  3. Locate the [facility] provider you are looking for under [facility] and page to a  number that you can be directly reached. 4. If you still have difficulty reaching the provider, please page the [facility] (Director on Call) for the Hospitalists listed on amion for assistance.  11/13/2018, 2:52 AM

## 2018-11-13 NOTE — Progress Notes (Signed)
I have seen and assessed patient and agree with Dr. Juleen China assessment and plan.  Patient is a 83 year old gentleman history of coronary artery disease, colon cancer, bilateral lower extremity lymphedema and venous stasis ulcers seen at [facility] 2 days prior to admission for worsening wound to the lower extremity and sent home with a prescription for Bactrim for UTI which he did not fill due to a sulfa allergy.  Patient presented back to the ED due to significant lower extremity weakness and inability to ambulate for the past day or so.  Work-up done noted a leukocytosis with a white count of 12,000 with extensive venous stasis ulcers on bilateral lower extremities with UA with large hemoglobin and 6-10 WBCs.  Patient placed empirically on IV antibiotics which we will continue.  MRI of the L-spine ordered which showed mild edema at the L4 inferior endplate without height loss, no central spinal canal stenosis, multilevel mild lateral recess and neuroforaminal stenosis, large right-sided osteophyte at L5-S1 in close proximity to the exiting right L5 nerve root.  Wound care assessed patient who recommended orthopedic evaluation.  Dr. Sharol Given with orthopedics consulted who has assessed patient and appreciate his input and recommendations.  Dr. Christella Noa, of neurosurgery was also asked to see patient due to MRI findings and appreciate his input and recommendations.  No charge.

## 2018-11-13 NOTE — ED Notes (Signed)
ED TO INPATIENT HANDOFF REPORT  ED Nurse Name and Phone #: William Hamburger, RN [phone]  S Name/Age/Gender [name] 83 y.o. male Room/Bed: 038C/038C  Code Status   Code Status: Full Code  Home/SNF/Other Home Patient oriented to: self, place, time and situation Is this baseline? No   Triage Complete: Triage complete  Chief Complaint see vascular surgeon   Triage Note Pt went to wound center at [facility] for L leg on Thursday.  States while putting dressings on L leg they noticed pt had what they thought were flea bites or chiggers on R leg with drainage.  Pt was admitted over night and discharged on Friday.  Reports bilateral legs gave out last night and pt slept in floor so that they didn't have to wake neighbor up to help get him up.  Called Dr. At [facility] and pt was advised to come to [facility] for possible PVD.   Allergies Allergies  Allergen Reactions  . Sulfa Antibiotics Other (See Comments)    Didn't feel right    Level of Care/Admitting Diagnosis ED Disposition    ED Disposition Condition Comment   Admit  Hospital Area: [place] [[id number]]  Level of Care: Med-Surg [16]  I expect the patient will be discharged within 24 hours: No (not a candidate for 5C-Observation unit)  Covid Evaluation: Asymptomatic Screening Protocol (No Symptoms)  Diagnosis: Bilateral leg weakness [id number]  Admitting Physician: Etta Quill [[id number]]  Attending Physician: Etta Quill [[id number]]  PT Class (Do Not Modify): Observation [104]  PT Acc Code (Do Not Modify): Observation [10022]       B Medical/Surgery History Past Medical History:  Diagnosis Date  . Arthritis   . CAD (coronary artery disease)    Abnormal stress perfusion study with apical and inferior wall ischemia. LAD had a long 99% stenosis followed by 30% stenosis, first diagonal 25% stenosis, circumflex luminal irregularities, second obtuse marginal 25% stenosis, RCA 40% followed by 40% stenosis.  EF 60%. Stenting with 2 bare metal stents by Dr. Burt Knack to the LAD.  Marland Kitchen Colon cancer [facility])    Radiation, chemo, surgery 2011  . Complication of anesthesia    Pt states he coughs alot during anesthesia  . History of tobacco use   . Hypertension    Past Surgical History:  Procedure Laterality Date  . CATARACT EXTRACTION W/PHACO Left 05/29/2016   Procedure: CATARACT EXTRACTION PHACO AND INTRAOCULAR LENS PLACEMENT (IOC);  Surgeon: Baruch Goldmann, MD;  Location: [facility];  Service: Ophthalmology;  Laterality: Left;  CDE: 10.39  . CATARACT EXTRACTION W/PHACO Right 06/12/2016   Procedure: CATARACT EXTRACTION PHACO AND INTRAOCULAR LENS PLACEMENT RIGHT EYE CDE= 13.59;  Surgeon: Baruch Goldmann, MD;  Location: [facility];  Service: Ophthalmology;  Laterality: Right;  right  . CORONARY ANGIOPLASTY WITH STENT PLACEMENT  2008   x 2  . HEMICOLECTOMY  01/10/2010  . INGUINAL HERNIA REPAIR  2012  . PORTACATH PLACEMENT  2011  . portacath removal  2011  . UMBILICAL HERNIA REPAIR  01/2008   left     A IV Location/Drains/Wounds Patient Lines/Drains/Airways Status   Active Line/Drains/Airways    Name:   Placement date:   Placement time:   Site:   Days:   Peripheral IV 11/13/18 Right Hand   11/13/18    0030    Hand   less than 1   Peripheral IV 11/13/18 Right Wrist   11/13/18    0040    Wrist   less  than 1   External Urinary Catheter   11/13/18    1523    -   less than 1   Incision (Closed) 05/29/16 Eye Left   05/29/16    1104     898   Incision (Closed) 06/12/16 Eye Right   06/12/16    0913     884          Intake/Output Last 24 hours  Intake/Output Summary (Last 24 hours) at 11/13/2018 1739 Last data filed at 11/13/2018 1452 Gross per 24 hour  Intake 596.25 ml  Output 475 ml  Net 121.25 ml    Labs/Imaging Results for orders placed or performed during the hospital encounter of 11/12/18 (from the past 48 hour(s))  Lactic acid, plasma     Status: None   Collection Time: 11/12/18  8:44 PM  Result Value  Ref Range   Lactic Acid, Venous 1.4 0.5 - 1.9 mmol/L    Comment: Performed at [facility], [address] [place], [place] [address]  Comprehensive metabolic panel     Status: Abnormal   Collection Time: 11/12/18  8:45 PM  Result Value Ref Range   Sodium 134 (L) 135 - 145 mmol/L   Potassium 3.6 3.5 - 5.1 mmol/L   Chloride 98 98 - 111 mmol/L   CO2 26 22 - 32 mmol/L   Glucose, Bld 144 (H) 70 - 99 mg/dL   BUN 26 (H) 8 - 23 mg/dL   Creatinine, Ser 1.04 0.61 - 1.24 mg/dL   Calcium 8.7 (L) 8.9 - 10.3 mg/dL   Total Protein 7.2 6.5 - 8.1 g/dL   Albumin 2.5 (L) 3.5 - 5.0 g/dL   AST 218 (H) 15 - 41 U/L   ALT 84 (H) 0 - 44 U/L   Alkaline Phosphatase 72 38 - 126 U/L   Total Bilirubin 0.6 0.3 - 1.2 mg/dL   GFR calc non Af Amer >60 >60 mL/min   GFR calc Af Amer >60 >60 mL/min   Anion gap 10 5 - 15    Comment: Performed at [facility], [place] [address], [place], [place] [address]  CBC with Differential     Status: Abnormal   Collection Time: 11/12/18  8:45 PM  Result Value Ref Range   WBC 12.4 (H) 4.0 - 10.5 K/uL   RBC 4.01 (L) 4.22 - 5.81 MIL/uL   Hemoglobin 11.9 (L) 13.0 - 17.0 g/dL   HCT 37.4 (L) 39.0 - 52.0 %   MCV 93.3 80.0 - 100.0 fL   MCH 29.7 26.0 - 34.0 pg   MCHC 31.8 30.0 - 36.0 g/dL   RDW 13.0 11.5 - 15.5 %   Platelets 439 (H) 150 - 400 K/uL   nRBC 0.0 0.0 - 0.2 %   Neutrophils Relative % 81 %   Neutro Abs 10.2 (H) 1.7 - 7.7 K/uL   Lymphocytes Relative 5 %   Lymphs Abs 0.6 (L) 0.7 - 4.0 K/uL   Monocytes Relative 8 %   Monocytes Absolute 1.0 0.1 - 1.0 K/uL   Eosinophils Relative 5 %   Eosinophils Absolute 0.6 (H) 0.0 - 0.5 K/uL   Basophils Relative 0 %   Basophils Absolute 0.0 0.0 - 0.1 K/uL   Immature Granulocytes 1 %   Abs Immature Granulocytes 0.06 0.00 - 0.07 K/uL    Comment: Performed at [facility], [place] [address] [place], [place] [address]  Blood culture (routine x 2)     Status: None (  Preliminary result)   Collection Time: 11/13/18 12:30 AM    Specimen: BLOOD RIGHT HAND  Result Value Ref Range   Specimen Description BLOOD RIGHT HAND    Special Requests      BOTTLES DRAWN AEROBIC AND ANAEROBIC Blood Culture results may not be optimal due to an inadequate volume of blood received in culture bottles   Culture      NO GROWTH < 24 HOURS Performed at [facility], [place] [address] [place], [place] [address]    Report Status PENDING   Lactic acid, plasma     Status: None   Collection Time: 11/13/18 12:49 AM  Result Value Ref Range   Lactic Acid, Venous 1.4 0.5 - 1.9 mmol/L    Comment: Performed at [place] [address], [place], [place] [address]  Urinalysis, Routine w reflex microscopic     Status: Abnormal   Collection Time: 11/13/18 12:49 AM  Result Value Ref Range   Color, Urine YELLOW YELLOW   APPearance CLEAR CLEAR   Specific Gravity, Urine 1.024 1.005 - 1.030   pH 5.0 5.0 - 8.0   Glucose, UA NEGATIVE NEGATIVE mg/dL   Hgb urine dipstick LARGE (A) NEGATIVE   Bilirubin Urine NEGATIVE NEGATIVE   Ketones, ur NEGATIVE NEGATIVE mg/dL   Protein, ur 30 (A) NEGATIVE mg/dL   Nitrite NEGATIVE NEGATIVE   Leukocytes,Ua NEGATIVE NEGATIVE   RBC / HPF >50 (H) 0 - 5 RBC/hpf   WBC, UA 6-10 0 - 5 WBC/hpf   Bacteria, UA FEW (A) NONE SEEN   Squamous Epithelial / LPF 0-5 0 - 5   Mucus PRESENT     Comment: Performed at [facility], [address], [place], [place] [address]  Blood culture (routine x 2)     Status: None (Preliminary result)   Collection Time: 11/13/18 12:50 AM   Specimen: BLOOD RIGHT WRIST  Result Value Ref Range   Specimen Description BLOOD RIGHT WRIST    Special Requests      BOTTLES DRAWN AEROBIC AND ANAEROBIC Blood Culture adequate volume   Culture      NO GROWTH < 24 HOURS Performed at [facility], [place] [address] [place], [place] [address]    Report Status PENDING   SARS CORONAVIRUS 2 (TAT 6-24 HRS) Nasopharyngeal Nasopharyngeal Swab     Status: None   Collection Time:  11/13/18  2:28 AM   Specimen: Nasopharyngeal Swab  Result Value Ref Range   SARS Coronavirus 2 NEGATIVE NEGATIVE    Comment: (NOTE) SARS-CoV-2 target nucleic acids are NOT DETECTED. The SARS-CoV-2 RNA is generally detectable in upper and lower respiratory specimens during the acute phase of infection. Negative results do not preclude SARS-CoV-2 infection, do not rule out co-infections with other pathogens, and should not be used as the sole basis for treatment or other patient management decisions. Negative results must be combined with clinical observations, patient history, and epidemiological information. The expected result is Negative. Fact Sheet for Patients: SugarRoll.be Fact Sheet for Healthcare Providers: https://www.woods-mathews.com/ This test is not yet approved or cleared by the [place] FDA and  has been authorized for detection and/or diagnosis of SARS-CoV-2 by FDA under an Emergency Use Authorization (EUA). This EUA will remain  in effect (meaning this test can be used) for the duration of the COVID-19 declaration under Section 56 4(b)(1) of the Act, 21 U.S.C. section 360bbb-3(b)(1), unless the authorization is terminated or revoked sooner. Performed at [facility],  [address], [place], [place] [address]   CK     Status: Abnormal   Collection Time: 11/13/18  3:41 AM  Result Value Ref Range   Total CK 3,156 (H) 49 - 397 U/L    Comment: Performed at [facility], [place] [address], [place], [place] [address]  Comprehensive metabolic panel     Status: Abnormal   Collection Time: 11/13/18  3:41 AM  Result Value Ref Range   Sodium 139 135 - 145 mmol/L   Potassium 3.7 3.5 - 5.1 mmol/L   Chloride 104 98 - 111 mmol/L   CO2 24 22 - 32 mmol/L   Glucose, Bld 118 (H) 70 - 99 mg/dL   BUN 28 (H) 8 - 23 mg/dL   Creatinine, Ser 0.96 0.61 - 1.24 mg/dL   Calcium 8.6 (L) 8.9 - 10.3 mg/dL   Total Protein 6.9 6.5 - 8.1  g/dL   Albumin 2.4 (L) 3.5 - 5.0 g/dL   AST 186 (H) 15 - 41 U/L   ALT 81 (H) 0 - 44 U/L   Alkaline Phosphatase 69 38 - 126 U/L   Total Bilirubin 0.8 0.3 - 1.2 mg/dL   GFR calc non Af Amer >60 >60 mL/min   GFR calc Af Amer >60 >60 mL/min   Anion gap 11 5 - 15    Comment: Performed at [facility], [place] [address] [place], [place] [address]  CBC     Status: Abnormal   Collection Time: 11/13/18  3:41 AM  Result Value Ref Range   WBC 12.4 (H) 4.0 - 10.5 K/uL   RBC 3.83 (L) 4.22 - 5.81 MIL/uL   Hemoglobin 11.4 (L) 13.0 - 17.0 g/dL   HCT 35.2 (L) 39.0 - 52.0 %   MCV 91.9 80.0 - 100.0 fL   MCH 29.8 26.0 - 34.0 pg   MCHC 32.4 30.0 - 36.0 g/dL   RDW 13.1 11.5 - 15.5 %   Platelets 410 (H) 150 - 400 K/uL   nRBC 0.0 0.0 - 0.2 %    Comment: Performed at [facility], [place] [address], [place], [place] [address]  Hepatitis panel, acute     Status: None   Collection Time: 11/13/18  3:41 AM  Result Value Ref Range   Hepatitis B Surface Ag NON REACTIVE NON REACTIVE   HCV Ab NON REACTIVE NON REACTIVE    Comment: (NOTE) Nonreactive HCV antibody screen is consistent with no HCV infections,  unless recent infection is suspected or other evidence exists to indicate HCV infection.    Hep A IgM NON REACTIVE NON REACTIVE   Hep B C IgM NON REACTIVE NON REACTIVE    Comment: Performed at [facility], [place] [address] [place], [place] [address]  TSH     Status: None   Collection Time: 11/13/18  3:41 AM  Result Value Ref Range   TSH 1.205 0.350 - 4.500 uIU/mL    Comment: Performed by a 3rd Generation assay with a functional sensitivity of <=0.01 uIU/mL. Performed at [facility], [place] [address] [place], [place] [address]    Dg Eye Foreign Body  Result Date: 11/13/2018 CLINICAL DATA:  Metal working/exposure; clearance prior to MRI EXAM: ORBITS FOR FOREIGN BODY - 2 VIEW COMPARISON:  None. FINDINGS: There is no evidence of metallic foreign body within the orbits. No significant  bone abnormality identified. IMPRESSION: No evidence of metallic foreign body within the orbits. Electronically Signed   By:  Kathreen Devoid   On: 11/13/2018 05:00   Dg Chest 2 View  Result Date: 11/13/2018 CLINICAL DATA:  Weakness EXAM: CHEST - 2 VIEW COMPARISON:  10/15/2009 FINDINGS: The heart size and mediastinal contours are within normal limits. Both lungs are clear. The visualized skeletal structures are unremarkable. IMPRESSION: No active cardiopulmonary disease. Electronically Signed   By: Ulyses Jarred M.D.   On: 11/13/2018 01:44   Dg Tibia/fibula Left  Result Date: 11/13/2018 CLINICAL DATA:  Left leg skin lesions EXAM: LEFT TIBIA AND FIBULA - 2 VIEW COMPARISON:  None. FINDINGS: No osseous erosion or soft tissue emphysema. There is severe tricompartmental left knee osteoarthrosis. No fracture. IMPRESSION: Severe tricompartmental left knee osteoarthrosis. No soft tissue emphysema. Electronically Signed   By: Ulyses Jarred M.D.   On: 11/13/2018 01:42   Dg Tibia/fibula Right  Result Date: 11/13/2018 CLINICAL DATA:  Leg pain EXAM: RIGHT TIBIA AND FIBULA - 2 VIEW COMPARISON:  None. FINDINGS: Mild tricompartmental osteoarthrosis with chondrocalcinosis in the femorotibial joint space. No knee effusion. No osseous erosion. There are vascular calcifications. IMPRESSION: Mild tricompartmental osteoarthrosis of the right knee. No acute abnormality of the right tibia or fibula. Electronically Signed   By: Ulyses Jarred M.D.   On: 11/13/2018 01:55   Mr Lumbar Spine Wo Contrast  Result Date: 11/13/2018 CLINICAL DATA:  Worsening lower extremity weakness EXAM: MRI LUMBAR SPINE WITHOUT CONTRAST TECHNIQUE: Multiplanar, multisequence MR imaging of the lumbar spine was performed. No intravenous contrast was administered. COMPARISON:  None. FINDINGS: Segmentation:  Normal Alignment:  Grade 1 retrolisthesis at L2-3. Vertebrae: Heterogeneous bone marrow signal. Mild edema at the L4 inferior endplate without  significant height loss. No retropulsion. Conus medullaris and cauda equina: Conus extends to the L1 level. Conus and cauda equina appear normal. Paraspinal and other soft tissues: Negative Disc levels: T12-L1: Small right subarticular disc protrusion without spinal canal or neural foraminal stenosis. L1-2: Disc space narrowing without focal herniation. No spinal canal or neural foraminal stenosis. Normal facets. L2-3: Intermediate disc osteophyte complex. No spinal canal stenosis. Moderate right and mild left foraminal stenosis. L3-4: Disc desiccation with mild bulge. Left-greater-than-right lateral recess narrowing. No central spinal canal or neural foraminal stenosis. L4-5: Left subarticular disc protrusion superimposed on mild bulge. Mild facet hypertrophy. There is mild narrowing of the left lateral recess with mild bilateral neural foraminal stenosis. L5-S1: Large right-sided osteophyte in close proximity to the exiting right L5 nerve root. No central spinal canal stenosis. Mild left foraminal stenosis. IMPRESSION: 1. Mild edema at the L4 inferior endplate without height loss. This may indicate a subacute fracture and could be a source of local low back pain. Correlate for recent fall or other trauma. 2. No central spinal canal stenosis. 3. Multilevel mild lateral recess and neural foraminal stenosis. 4. Large right-sided osteophyte at L5-S1 in close proximity to the exiting right L5 nerve root. Correlate for corresponding radiculopathy. Electronically Signed   By: Ulyses Jarred M.D.   On: 11/13/2018 05:58   Dg Foot Complete Left  Result Date: 11/13/2018 CLINICAL DATA:  Skin lesions EXAM: LEFT FOOT - COMPLETE 3+ VIEW COMPARISON:  None. FINDINGS: No osseous erosion or soft tissue emphysema. No fracture or dislocation. There are vascular calcifications. The bones are mildly osteopenic. IMPRESSION: No radiographic evidence of osteomyelitis. No fracture or dislocation of the left foot. Electronically Signed    By: Ulyses Jarred M.D.   On: 11/13/2018 01:54   Dg Foot Complete Right  Result Date: 11/13/2018 CLINICAL DATA:  Lower extremity  skin lesions EXAM: RIGHT FOOT COMPLETE - 3+ VIEW COMPARISON:  None. FINDINGS: Bones are osteopenic. There is no focal osseous erosion. No fracture or dislocation. Mild midfoot osteoarthrosis. IMPRESSION: 1. No radiographic evidence of osteomyelitis in the right foot. Mild midfoot osteoarthrosis. 2. Osteopenia. Electronically Signed   By: Ulyses Jarred M.D.   On: 11/13/2018 01:55    Pending Labs Unresulted Labs (From admission, onward)    Start     Ordered   11/14/18 0500  CBC with Differential/Platelet  Tomorrow morning,   R     11/13/18 1702   11/14/18 [id number]  Basic metabolic panel  Tomorrow morning,   R     11/13/18 1702   11/14/18 0500  Magnesium  Tomorrow morning,   R     11/13/18 1702   11/12/18 2357  Urine Culture  Once,   STAT     11/12/18 2356          Vitals/Pain Today's Vitals   11/13/18 1000 11/13/18 1200 11/13/18 1547 11/13/18 1659  BP: 125/77 116/70  132/65  Pulse: (!) 102 86  87  Resp: (!) 28 (!) 23  20  Temp:      TempSrc:      SpO2: 96% 97%  97%  PainSc:   0-No pain     Isolation Precautions No active isolations  Medications Medications  sodium chloride flush (NS) 0.9 % injection 3 mL ( Intravenous Canceled Entry 11/13/18 0754)  vancomycin (VANCOCIN) 2,500 mg in sodium chloride 0.9 % 500 mL IVPB (has no administration in time range)  piperacillin-tazobactam (ZOSYN) IVPB 3.375 g (0 g Intravenous Stopped 11/13/18 1304)  atorvastatin (LIPITOR) tablet 20 mg (has no administration in time range)  aspirin chewable tablet 81 mg (has no administration in time range)  hydrochlorothiazide (HYDRODIURIL) tablet 25 mg (25 mg Oral Given 11/13/18 0919)  metoprolol succinate (TOPROL-XL) 24 hr tablet 100 mg (has no administration in time range)  ferrous gluconate (FERGON) tablet 324 mg (324 mg Oral Given 11/13/18 0919)  naproxen sodium (ANAPROX)  tablet 275 mg (has no administration in time range)  cholecalciferol (VITAMIN D3) tablet 1,000 Units (1,000 Units Oral Given 11/13/18 0918)  acetaminophen (TYLENOL) tablet 650 mg (has no administration in time range)    Or  acetaminophen (TYLENOL) suppository 650 mg (has no administration in time range)  ondansetron (ZOFRAN) tablet 4 mg (has no administration in time range)    Or  ondansetron (ZOFRAN) injection 4 mg (has no administration in time range)  enoxaparin (LOVENOX) injection 40 mg (40 mg Subcutaneous Given 11/13/18 1731)  amLODipine (NORVASC) tablet 10 mg (has no administration in time range)    And  benazepril (LOTENSIN) tablet 40 mg (has no administration in time range)  feeding supplement (PRO-STAT SUGAR FREE 64) liquid 30 mL (30 mLs Oral Given 11/13/18 1305)  piperacillin-tazobactam (ZOSYN) IVPB 3.375 g (0 g Intravenous Stopped 11/13/18 0237)  vancomycin (VANCOCIN) 2,000 mg in sodium chloride 0.9 % 500 mL IVPB (0 mg Intravenous Stopped 11/13/18 0657)    Mobility walks with person assist High fall risk   Focused Assessments Skin: bilateral lower extremities; redness + irritation    R Recommendations: See Admitting Provider Note  Report given to:   Additional Notes:

## 2018-11-13 NOTE — ED Notes (Signed)
Patient transported to MRI 

## 2018-11-13 NOTE — ED Notes (Signed)
Ordered breakfast--[name] 

## 2018-11-13 NOTE — Progress Notes (Signed)
Pharmacy Antibiotic Note  ZIHAN GIMBEL is a 84 y.o. male admitted on 11/12/2018 with cellulitis.  Pharmacy has been consulted for vancomycin and zosyn dosing.  Plan: Vancomycin 2gm IV x 1 then 2500mg  IV q24 hours Zosyn EI F/u renal function, cultures and clinical course    Temp (24hrs), Avg:98.6 F (37 C), Min:98.6 F (37 C), Max:98.6 F (37 C)  Recent Labs  Lab 11/12/18 2044 11/12/18 2045 11/13/18 0049  WBC  --  12.4*  --   CREATININE  --  1.04  --   LATICACIDVEN 1.4  --  1.4    CrCl cannot be calculated (Unknown ideal weight.).    Allergies  Allergen Reactions  . Sulfa Antibiotics Other (See Comments)    Didn't feel right     Thank you for allowing pharmacy to be a part of this patient's care.  Excell Seltzer Poteet 11/13/2018 2:45 AM

## 2018-11-13 NOTE — Consult Note (Signed)
ORTHOPAEDIC CONSULTATION  REQUESTING PHYSICIAN: Eugenie Filler, MD  Chief Complaint: Chronic venous ulcerations both lower extremities.  HPI: [name] is a 83 y.o. male who presents with chronic venous ulcerations both lower extremities.  Patient has been treated at the wound center in the evening.  Patient states he does have compression stockings and also has pneumatic compression at home.  Patient states he has not been undergoing compression wraps.  Patient presents at this time with increasing vasculitis and cellulitis of both lower extremities with ulcerations.  Past Medical History:  Diagnosis Date  . Arthritis   . CAD (coronary artery disease)    Abnormal stress perfusion study with apical and inferior wall ischemia. LAD had a long 99% stenosis followed by 30% stenosis, first diagonal 25% stenosis, circumflex luminal irregularities, second obtuse marginal 25% stenosis, RCA 40% followed by 40% stenosis. EF 60%. Stenting with 2 bare metal stents by Dr. Burt Knack to the LAD.  [facility] Kitchen Colon cancer [facility])    Radiation, chemo, surgery 2011  . Complication of anesthesia    Pt states he coughs alot during anesthesia  . History of tobacco use   . Hypertension    Past Surgical History:  Procedure Laterality Date  . CATARACT EXTRACTION W/PHACO Left 05/29/2016   Procedure: CATARACT EXTRACTION PHACO AND INTRAOCULAR LENS PLACEMENT (IOC);  Surgeon: Baruch Goldmann, MD;  Location: [facility];  Service: Ophthalmology;  Laterality: Left;  CDE: 10.39  . CATARACT EXTRACTION W/PHACO Right 06/12/2016   Procedure: CATARACT EXTRACTION PHACO AND INTRAOCULAR LENS PLACEMENT RIGHT EYE CDE= 13.59;  Surgeon: Baruch Goldmann, MD;  Location: [facility];  Service: Ophthalmology;  Laterality: Right;  right  . CORONARY ANGIOPLASTY WITH STENT PLACEMENT  2008   x 2  . HEMICOLECTOMY  01/10/2010  . INGUINAL HERNIA REPAIR  2012  . PORTACATH PLACEMENT  2011  . portacath removal  2011  . UMBILICAL HERNIA REPAIR  01/2008   left   Social History   Socioeconomic History  . Marital status: Married    Spouse name: Not on file  . Number of children: Not on file  . Years of education: Not on file  . Highest education level: Not on file  Occupational History  . Occupation: Retired  Scientific laboratory technician  . Financial resource strain: Not on file  . Food insecurity    Worry: Not on file    Inability: Not on file  . Transportation needs    Medical: Not on file    Non-medical: Not on file  Tobacco Use  . Smoking status: Former Smoker    Packs/day: 0.50    Years: 57.00    Pack years: 28.50    Quit date: 2006    Years since quitting: 14.7  . Smokeless tobacco: Never Used  . Tobacco comment: Used to be a 3 pack per day smoker  Substance and Sexual Activity  . Alcohol use: No  . Drug use: No  . Sexual activity: Yes  Lifestyle  . Physical activity    Days per week: Not on file    Minutes per session: Not on file  . Stress: Not on file  Relationships  . Social Herbalist on phone: Not on file    Gets together: Not on file    Attends religious service: Not on file    Active member of club or organization: Not on file    Attends meetings of clubs or organizations: Not on file    Relationship status:  Not on file  Other Topics Concern  . Not on file  Social History Narrative   Married with 3 children and 2 grandchildren   Family History  Adopted: Yes   - negative except otherwise stated in the family history section Allergies  Allergen Reactions  . Sulfa Antibiotics Other (See Comments)    Didn't feel right   Prior to Admission medications   Medication Sig Start Date End Date Taking? Authorizing Provider  amLODipine-benazepril (LOTREL) 10-40 MG per capsule Take 1 capsule by mouth daily. Patient taking differently: Take 1 capsule by mouth at bedtime.  09/12/12  Yes Minus Breeding, MD  aspirin 81 MG chewable tablet Chew 81 mg by mouth every evening.   Yes [provider]  atorvastatin  (LIPITOR) 20 MG tablet Take 1 tablet (20 mg total) by mouth daily. Patient taking differently: Take 20 mg by mouth every evening.  09/12/12  Yes Minus Breeding, MD  Ferrous Gluconate (IRON) 240 (27 FE) MG TABS Take 240 mg by mouth daily.    Yes [provider]  hydrochlorothiazide (HYDRODIURIL) 25 MG tablet Take 25 mg by mouth daily.   Yes [provider]  Lycopene 10 MG CAPS Take 10 mg by mouth daily.   Yes [provider]  metoprolol (LOPRESSOR) 100 MG tablet Take 1 tablet (100 mg total) by mouth daily. Patient taking differently: Take 100 mg by mouth at bedtime.  09/12/12  Yes Minus Breeding, MD  naproxen sodium (ANAPROX) 220 MG tablet Take 220 mg by mouth daily as needed (pain).   Yes [provider]  nitroGLYCERIN (NITROSTAT) 0.4 MG SL tablet Place 1 tablet (0.4 mg total) under the tongue every 5 (five) minutes as needed. Patient taking differently: Place 0.4 mg under the tongue every 5 (five) minutes as needed for chest pain.  12/27/12  Yes Minus Breeding, MD  Vitamin D, Cholecalciferol, 1000 UNITS TABS Take 1,000 Units by mouth 2 (two) times daily.   Yes [provider]   Dg Eye Foreign Body  Result Date: 11/13/2018 CLINICAL DATA:  Metal working/exposure; clearance prior to MRI EXAM: ORBITS FOR FOREIGN BODY - 2 VIEW COMPARISON:  None. FINDINGS: There is no evidence of metallic foreign body within the orbits. No significant bone abnormality identified. IMPRESSION: No evidence of metallic foreign body within the orbits. Electronically Signed   By: Kathreen Devoid   On: 11/13/2018 05:00   Dg Chest 2 View  Result Date: 11/13/2018 CLINICAL DATA:  Weakness EXAM: CHEST - 2 VIEW COMPARISON:  10/15/2009 FINDINGS: The heart size and mediastinal contours are within normal limits. Both lungs are clear. The visualized skeletal structures are unremarkable. IMPRESSION: No active cardiopulmonary disease. Electronically Signed   By: Ulyses Jarred M.D.   On: 11/13/2018  01:44   Dg Tibia/fibula Left  Result Date: 11/13/2018 CLINICAL DATA:  Left leg skin lesions EXAM: LEFT TIBIA AND FIBULA - 2 VIEW COMPARISON:  None. FINDINGS: No osseous erosion or soft tissue emphysema. There is severe tricompartmental left knee osteoarthrosis. No fracture. IMPRESSION: Severe tricompartmental left knee osteoarthrosis. No soft tissue emphysema. Electronically Signed   By: Ulyses Jarred M.D.   On: 11/13/2018 01:42   Dg Tibia/fibula Right  Result Date: 11/13/2018 CLINICAL DATA:  Leg pain EXAM: RIGHT TIBIA AND FIBULA - 2 VIEW COMPARISON:  None. FINDINGS: Mild tricompartmental osteoarthrosis with chondrocalcinosis in the femorotibial joint space. No knee effusion. No osseous erosion. There are vascular calcifications. IMPRESSION: Mild tricompartmental osteoarthrosis of the right knee. No acute abnormality of the  right tibia or fibula. Electronically Signed   By: Ulyses Jarred M.D.   On: 11/13/2018 01:55   Mr Lumbar Spine Wo Contrast  Result Date: 11/13/2018 CLINICAL DATA:  Worsening lower extremity weakness EXAM: MRI LUMBAR SPINE WITHOUT CONTRAST TECHNIQUE: Multiplanar, multisequence MR imaging of the lumbar spine was performed. No intravenous contrast was administered. COMPARISON:  None. FINDINGS: Segmentation:  Normal Alignment:  Grade 1 retrolisthesis at L2-3. Vertebrae: Heterogeneous bone marrow signal. Mild edema at the L4 inferior endplate without significant height loss. No retropulsion. Conus medullaris and cauda equina: Conus extends to the L1 level. Conus and cauda equina appear normal. Paraspinal and other soft tissues: Negative Disc levels: T12-L1: Small right subarticular disc protrusion without spinal canal or neural foraminal stenosis. L1-2: Disc space narrowing without focal herniation. No spinal canal or neural foraminal stenosis. Normal facets. L2-3: Intermediate disc osteophyte complex. No spinal canal stenosis. Moderate right and mild left foraminal stenosis. L3-4: Disc  desiccation with mild bulge. Left-greater-than-right lateral recess narrowing. No central spinal canal or neural foraminal stenosis. L4-5: Left subarticular disc protrusion superimposed on mild bulge. Mild facet hypertrophy. There is mild narrowing of the left lateral recess with mild bilateral neural foraminal stenosis. L5-S1: Large right-sided osteophyte in close proximity to the exiting right L5 nerve root. No central spinal canal stenosis. Mild left foraminal stenosis. IMPRESSION: 1. Mild edema at the L4 inferior endplate without height loss. This may indicate a subacute fracture and could be a source of local low back pain. Correlate for recent fall or other trauma. 2. No central spinal canal stenosis. 3. Multilevel mild lateral recess and neural foraminal stenosis. 4. Large right-sided osteophyte at L5-S1 in close proximity to the exiting right L5 nerve root. Correlate for corresponding radiculopathy. Electronically Signed   By: Ulyses Jarred M.D.   On: 11/13/2018 05:58   Dg Foot Complete Left  Result Date: 11/13/2018 CLINICAL DATA:  Skin lesions EXAM: LEFT FOOT - COMPLETE 3+ VIEW COMPARISON:  None. FINDINGS: No osseous erosion or soft tissue emphysema. No fracture or dislocation. There are vascular calcifications. The bones are mildly osteopenic. IMPRESSION: No radiographic evidence of osteomyelitis. No fracture or dislocation of the left foot. Electronically Signed   By: Ulyses Jarred M.D.   On: 11/13/2018 01:54   Dg Foot Complete Right  Result Date: 11/13/2018 CLINICAL DATA:  Lower extremity skin lesions EXAM: RIGHT FOOT COMPLETE - 3+ VIEW COMPARISON:  None. FINDINGS: Bones are osteopenic. There is no focal osseous erosion. No fracture or dislocation. Mild midfoot osteoarthrosis. IMPRESSION: 1. No radiographic evidence of osteomyelitis in the right foot. Mild midfoot osteoarthrosis. 2. Osteopenia. Electronically Signed   By: Ulyses Jarred M.D.   On: 11/13/2018 01:55   - pertinent xrays, CT, MRI  studies were reviewed and independently interpreted  Positive ROS: All other systems have been reviewed and were otherwise negative with the exception of those mentioned in the HPI and as above.  Physical Exam: General: Alert, no acute distress Psychiatric: Patient is competent for consent with normal mood and affect Lymphatic: No axillary or cervical lymphadenopathy Cardiovascular: No pedal edema Respiratory: No cyanosis, no use of accessory musculature GI: No organomegaly, abdomen is soft and non-tender    Images: Media Information    Document Information  Photos    11/13/2018 10:27  Attached To:  Hospital Encounter on 11/12/18  Source Information  Newt Minion, MD  [facility]    @ENCIMAGES @  Labs:  Lab Results  Component Value Date   REPTSTATUS PENDING 11/13/2018  CULT  11/13/2018    NO GROWTH < 12 HOURS Performed at [facility] [address], [place],  [address]     Lab Results  Component Value Date   ALBUMIN 2.4 (L) 11/13/2018   ALBUMIN 2.5 (L) 11/12/2018   ALBUMIN 4.3 06/09/2012    Neurologic: Patient does not have protective sensation bilateral lower extremities.   MUSCULOSKELETAL:   Skin: Examination patient has petechial and purpuric rashes on both lower extremities with pitting edema up to the tibial tubercle with lymphatic and venous insufficiency.  There is also multiple ulcers on both lower extremities secondary to his swelling.  There is cellulitis of both lower extremities.  Does have palpable pulses.  Patient has severe protein caloric malnutrition with an albumin of 2.4. Normal platlets no indication for ITP. Patient has clear serous drainage from swelling blisters.  Assessment: Assessment: Cellulitis both legs with ischemic petechial and purpuric blisters on both lower extremities secondary to venous and lymphatic insufficiency with severe protein caloric malnutrition.  Plan: Plan: Patient will need IV  antibiotics.  I will try to get my compression socks on him tomorrow with patient wearing the 2 layer 30-40 compression stocking during the day and the 15-20 sock around-the-clock.  Patient's leg should be elevated level with his heart at all times.  Patient does need protein supplement.  I will consult nutrition.  Thank you for the consult and the opportunity to see [name], [place] [phone] 10:30 AM

## 2018-11-13 NOTE — Progress Notes (Signed)
Patient ID: [name], male   DOB: 11-09-1935, 83 y.o.   MRN: [id number] BP 124/65   Pulse 80   Temp 98.6 F (37 C) (Oral)   Resp (!) 29   SpO2 97%  Have reviewed films, unlikely the findings on MRI have rendered him with so much pain that he cannot walk. Will deliver full exam later.

## 2018-11-14 DIAGNOSIS — L97202 Non-pressure chronic ulcer of unspecified calf with fat layer exposed: Secondary | ICD-10-CM | POA: Diagnosis not present

## 2018-11-14 DIAGNOSIS — I890 Lymphedema, not elsewhere classified: Secondary | ICD-10-CM | POA: Diagnosis present

## 2018-11-14 DIAGNOSIS — I872 Venous insufficiency (chronic) (peripheral): Secondary | ICD-10-CM | POA: Diagnosis present

## 2018-11-14 DIAGNOSIS — E43 Unspecified severe protein-calorie malnutrition: Secondary | ICD-10-CM | POA: Diagnosis present

## 2018-11-14 DIAGNOSIS — T07XXXA Unspecified multiple injuries, initial encounter: Secondary | ICD-10-CM | POA: Diagnosis not present

## 2018-11-14 DIAGNOSIS — I83002 Varicose veins of unspecified lower extremity with ulcer of calf: Secondary | ICD-10-CM | POA: Diagnosis not present

## 2018-11-14 DIAGNOSIS — M5126 Other intervertebral disc displacement, lumbar region: Secondary | ICD-10-CM | POA: Diagnosis present

## 2018-11-14 DIAGNOSIS — R531 Weakness: Secondary | ICD-10-CM | POA: Diagnosis present

## 2018-11-14 DIAGNOSIS — Z85038 Personal history of other malignant neoplasm of large intestine: Secondary | ICD-10-CM | POA: Diagnosis not present

## 2018-11-14 DIAGNOSIS — L03116 Cellulitis of left lower limb: Secondary | ICD-10-CM | POA: Diagnosis present

## 2018-11-14 DIAGNOSIS — R7401 Elevation of levels of liver transaminase levels: Secondary | ICD-10-CM | POA: Diagnosis not present

## 2018-11-14 DIAGNOSIS — M2578 Osteophyte, vertebrae: Secondary | ICD-10-CM | POA: Diagnosis present

## 2018-11-14 DIAGNOSIS — Z20828 Contact with and (suspected) exposure to other viral communicable diseases: Secondary | ICD-10-CM | POA: Diagnosis present

## 2018-11-14 DIAGNOSIS — G8929 Other chronic pain: Secondary | ICD-10-CM | POA: Diagnosis present

## 2018-11-14 DIAGNOSIS — R29898 Other symptoms and signs involving the musculoskeletal system: Secondary | ICD-10-CM

## 2018-11-14 DIAGNOSIS — L03115 Cellulitis of right lower limb: Secondary | ICD-10-CM | POA: Diagnosis present

## 2018-11-14 DIAGNOSIS — I361 Nonrheumatic tricuspid (valve) insufficiency: Secondary | ICD-10-CM | POA: Diagnosis not present

## 2018-11-14 DIAGNOSIS — R8271 Bacteriuria: Secondary | ICD-10-CM | POA: Diagnosis not present

## 2018-11-14 DIAGNOSIS — R9389 Abnormal findings on diagnostic imaging of other specified body structures: Secondary | ICD-10-CM

## 2018-11-14 DIAGNOSIS — M48061 Spinal stenosis, lumbar region without neurogenic claudication: Secondary | ICD-10-CM | POA: Diagnosis present

## 2018-11-14 DIAGNOSIS — I83029 Varicose veins of left lower extremity with ulcer of unspecified site: Secondary | ICD-10-CM | POA: Diagnosis present

## 2018-11-14 DIAGNOSIS — I83019 Varicose veins of right lower extremity with ulcer of unspecified site: Secondary | ICD-10-CM | POA: Diagnosis present

## 2018-11-14 DIAGNOSIS — I10 Essential (primary) hypertension: Secondary | ICD-10-CM | POA: Diagnosis present

## 2018-11-14 DIAGNOSIS — S32049A Unspecified fracture of fourth lumbar vertebra, initial encounter for closed fracture: Secondary | ICD-10-CM | POA: Diagnosis present

## 2018-11-14 DIAGNOSIS — Z87891 Personal history of nicotine dependence: Secondary | ICD-10-CM | POA: Diagnosis not present

## 2018-11-14 DIAGNOSIS — L97919 Non-pressure chronic ulcer of unspecified part of right lower leg with unspecified severity: Secondary | ICD-10-CM | POA: Diagnosis present

## 2018-11-14 DIAGNOSIS — Z9841 Cataract extraction status, right eye: Secondary | ICD-10-CM | POA: Diagnosis not present

## 2018-11-14 DIAGNOSIS — Z9842 Cataract extraction status, left eye: Secondary | ICD-10-CM | POA: Diagnosis not present

## 2018-11-14 DIAGNOSIS — J189 Pneumonia, unspecified organism: Secondary | ICD-10-CM | POA: Diagnosis present

## 2018-11-14 DIAGNOSIS — L97929 Non-pressure chronic ulcer of unspecified part of left lower leg with unspecified severity: Secondary | ICD-10-CM | POA: Diagnosis present

## 2018-11-14 DIAGNOSIS — J9601 Acute respiratory failure with hypoxia: Secondary | ICD-10-CM | POA: Diagnosis not present

## 2018-11-14 DIAGNOSIS — R748 Abnormal levels of other serum enzymes: Secondary | ICD-10-CM | POA: Diagnosis present

## 2018-11-14 DIAGNOSIS — I2510 Atherosclerotic heart disease of native coronary artery without angina pectoris: Secondary | ICD-10-CM | POA: Diagnosis present

## 2018-11-14 LAB — BASIC METABOLIC PANEL
Anion gap: 8 (ref 5–15)
BUN: 23 mg/dL (ref 8–23)
CO2: 27 mmol/L (ref 22–32)
Calcium: 8.3 mg/dL — ABNORMAL LOW (ref 8.9–10.3)
Chloride: 105 mmol/L (ref 98–111)
Creatinine, Ser: 1.01 mg/dL (ref 0.61–1.24)
GFR calc Af Amer: >60 mL/min (ref >60)
GFR calc non Af Amer: >60 mL/min (ref >60)
Glucose, Bld: 106 mg/dL — ABNORMAL HIGH (ref 70–99)
Potassium: 4.1 mmol/L (ref 3.5–5.1)
Sodium: 140 mmol/L (ref 135–145)

## 2018-11-14 LAB — CBC WITH DIFFERENTIAL/PLATELET
Abs Immature Granulocytes: 0.07 10*3/uL (ref 0.00–0.07)
Basophils Absolute: 0 10*3/uL (ref 0.0–0.1)
Basophils Relative: 0 %
Eosinophils Absolute: 0.8 10*3/uL — ABNORMAL HIGH (ref 0.0–0.5)
Eosinophils Relative: 7 %
HCT: 33.2 % — ABNORMAL LOW (ref 39.0–52.0)
Hemoglobin: 10.8 g/dL — ABNORMAL LOW (ref 13.0–17.0)
Immature Granulocytes: 1 %
Lymphocytes Relative: 5 %
Lymphs Abs: 0.5 10*3/uL — ABNORMAL LOW (ref 0.7–4.0)
MCH: 29.9 pg (ref 26.0–34.0)
MCHC: 32.5 g/dL (ref 30.0–36.0)
MCV: 92 fL (ref 80.0–100.0)
Monocytes Absolute: 0.9 10*3/uL (ref 0.1–1.0)
Monocytes Relative: 8 %
Neutro Abs: 9.5 10*3/uL — ABNORMAL HIGH (ref 1.7–7.7)
Neutrophils Relative %: 79 %
Platelets: 401 10*3/uL — ABNORMAL HIGH (ref 150–400)
RBC: 3.61 MIL/uL — ABNORMAL LOW (ref 4.22–5.81)
RDW: 13.1 % (ref 11.5–15.5)
WBC: 11.7 10*3/uL — ABNORMAL HIGH (ref 4.0–10.5)
nRBC: 0 % (ref 0.0–0.2)

## 2018-11-14 LAB — URINE CULTURE: Culture: <10000 — AB

## 2018-11-14 LAB — CK: Total CK: 1149 U/L — ABNORMAL HIGH (ref 49–397)

## 2018-11-14 LAB — MAGNESIUM: Magnesium: 1.8 mg/dL (ref 1.7–2.4)

## 2018-11-14 MED ORDER — ENSURE ENLIVE PO LIQD
237.0000 mL | Freq: Two times a day (BID) | ORAL | Status: DC
  Administered 2018-11-14 – 2018-11-22 (×13): 237 mL via ORAL
  Filled 2018-11-14 (×2): qty 237

## 2018-11-14 NOTE — Evaluation (Addendum)
Physical Therapy Evaluation Patient Details Name: [name] MRN: [id number] DOB: 09/14/1935 Today's Date: 11/14/2018   History of Present Illness  [name] is a 83 y.o. male with medical history significant of CAD, colon Ca, BLE lymph edema and venous stasis ulcers.Patient was hospitalized at [facility] x2 days ago for 1 day after worsening wounds to leg.  Sent home with script for bactrim for UTI which he didn't fill due to sulfa allergy.After going home patient has been unable to stand due to weakness in his legs and unable to walk for the past one day or so.  Has chronic low back pain which is unchanged.  Clinical Impression  Pt was seen for mobility with work on standing balance control bedside in presence of wife.  Pt is quite weak, and will need to be assisted significantly to return home with wife and able to self maneuver on AD's.  PLOF was a quad cane in each hand, but now is barely able to sidestep on side of bed with knees supported by bed.  Instructed in basic ex to do on bed and cautioned wife who is promoting more aggressive therapy that skin could be injured with more.  Follow for acute therapy to increase standing tolerance and control, then to transition to a rehab setting when able.  O2 sats need to be monitored as well as O2 sat dropped to 85% with gait on second trial of gait on side of bed, returned to 99% with rest.    Follow Up Recommendations CIR    Equipment Recommendations  None recommended by PT    Recommendations for Other Services Rehab consult     Precautions / Restrictions Precautions Precautions: Fall Precaution Comments: monitor for bandages on legs to avoid pulling them off with movement(monitor O2 sats with all mobility) Restrictions Weight Bearing Restrictions: No      Mobility  Bed Mobility Overal bed mobility: Needs Assistance Bed Mobility: Supine to Sit;Sit to Supine     Supine to sit: Mod assist Sit to supine: Mod assist   General bed  mobility comments: mod to assist trunk and legs out of bed, mod for back to bed with legs  Transfers Overall transfer level: Needs assistance Equipment used: Rolling walker (2 wheeled);1 person hand held assist Transfers: Sit to/from Stand Sit to Stand: Mod assist;Max assist         General transfer comment: max initially then mod assist with practice  Ambulation/Gait Ambulation/Gait assistance: Min assist Gait Distance (Feet): 10 Feet Assistive device: Rolling walker (2 wheeled);1 person hand held assist Gait Pattern/deviations: Step-to pattern;Wide base of support;Trunk flexed;Decreased weight shift to left Gait velocity: reduced Gait velocity interpretation: <1.8 ft/sec, indicate of risk for recurrent falls General Gait Details: sidesteps with RW up to 10' with one sitting rest on the bed  Stairs            Wheelchair Mobility    Modified Rankin (Stroke Patients Only)       Balance Overall balance assessment: History of Falls;Needs assistance Sitting-balance support: Feet supported;Bilateral upper extremity supported Sitting balance-Leahy Scale: Fair     Standing balance support: Bilateral upper extremity supported;During functional activity Standing balance-Leahy Scale: Poor Standing balance comment: poor with heavy reliance on RW                             Pertinent Vitals/Pain Pain Assessment: Faces Faces Pain Scale: Hurts even more Pain Location: B lower  legs Pain Descriptors / Indicators: Grimacing Pain Intervention(s): Limited activity within patient's tolerance;Monitored during session;Premedicated before session;Repositioned    Home Living Family/patient expects to be discharged to:: Private residence Living Arrangements: Spouse/significant other Available Help at Discharge: Family;Available 24 hours/day Type of Home: House       Home Layout: One level Home Equipment: [place] - 2 wheels;Walker - 4 wheels;Cane - quad;Other  (comment)(2 canes at home)      Prior Function Level of Independence: Independent with assistive device(s)         Comments: no recent falls until this UTI occurred     Hand Dominance   Dominant Hand: Right    Extremity/Trunk Assessment   Upper Extremity Assessment Upper Extremity Assessment: Generalized weakness    Lower Extremity Assessment Lower Extremity Assessment: Generalized weakness    Cervical / Trunk Assessment Cervical / Trunk Assessment: Other exceptions(retrolisthesis L2-3, L4 subacute fracture, L5-S1 osteophytes) Cervical / Trunk Exceptions: may have impingement on nerve root at L5 from osteophytes  Communication   Communication: HOH  Cognition Arousal/Alertness: Awake/alert Behavior During Therapy: WFL for tasks assessed/performed Overall Cognitive Status: Within Functional Limits for tasks assessed                                 General Comments: pt is able to talk about history but wife asks lots of questions      General Comments General comments (skin integrity, edema, etc.): pt is unable to take steps away from bed due to weakness and buckling tendencies of knees     Exercises General Exercises - Lower Extremity Ankle Circles/Pumps: AROM;Both;5 reps Quad Sets: AROM;Both;10 reps Gluteal Sets: AROM;Both;10 reps   Assessment/Plan    PT Assessment Patient needs continued PT services  PT Problem List Decreased strength;Decreased range of motion;Decreased activity tolerance;Decreased balance;Decreased mobility;Decreased coordination;Decreased safety awareness;Cardiopulmonary status limiting activity;Decreased skin integrity;Pain       PT Treatment Interventions DME instruction;Gait training;Stair training;Functional mobility training;Therapeutic activities;Therapeutic exercise;Balance training;Neuromuscular re-education;Patient/family education    PT Goals (Current goals can be found in the Care Plan section)  Acute Rehab PT  Goals Patient Stated Goal: to get stronger and get home PT Goal Formulation: With patient/family Time For Goal Achievement: 11/28/18 Potential to Achieve Goals: Good    Frequency Min 3X/week   Barriers to discharge Inaccessible home environment;Decreased caregiver support requires 2 person help and has one assist at home    Co-evaluation               AM-PAC PT "6 Clicks" Mobility  Outcome Measure Help needed turning from your back to your side while in a flat bed without using bedrails?: A Lot Help needed moving from lying on your back to sitting on the side of a flat bed without using bedrails?: A Lot Help needed moving to and from a bed to a chair (including a wheelchair)?: A Lot Help needed standing up from a chair using your arms (e.g., wheelchair or bedside chair)?: A Lot Help needed to walk in hospital room?: A Lot Help needed climbing 3-5 steps with a railing? : Total 6 Click Score: 11    End of Session Equipment Utilized During Treatment: Gait belt Activity Tolerance: Patient tolerated treatment well;Patient limited by fatigue;Treatment limited secondary to medical complications (Comment) Patient left: in bed;with call bell/phone within reach;with bed alarm set;with family/visitor present Nurse Communication: Mobility status PT Visit Diagnosis: Unsteadiness on feet (R26.81);Muscle weakness (generalized) (M62.81);Difficulty in walking,  not elsewhere classified (R26.2)    Time: [id number] PT Time Calculation (min) (ACUTE ONLY): 47 min   Charges:   PT Evaluation $PT Eval Moderate Complexity: 1 Mod PT Treatments $Gait Training: 8-22 mins $Therapeutic Exercise: 8-22 mins       Ramond Dial 11/14/2018, 5:31 PM   Mee Hives, PT MS Acute Rehab Dept. Number: [place] and [place]

## 2018-11-14 NOTE — Care Management Obs Status (Signed)
[place] NOTIFICATION   Patient Details  Name: [name] MRN: [id number] Date of Birth: Feb 28, 1935   Medicare Observation Status Notification Given:  Yes    Carles Collet, RN 11/14/2018, 10:50 AM

## 2018-11-14 NOTE — Progress Notes (Signed)
PROGRESS NOTE    KACY MUNIER  [id number] DOB: Oct 31, 1935 DOA: 11/12/2018 PCP: Renne Crigler, NP    Brief Narrative:  HPI per Dr. Jori Moll is a 83 y.o. male with medical history significant of CAD, colon Ca, BLE lymph edema and venous stasis ulcers.  Patient was hospitalized at [facility] x2 days ago for 1 day after worsening wounds to leg.  Sent home with script for bactrim for UTI which he didn't fill due to sulfa allergy.  After going home patient has been unable to stand due to weakness in his legs and unable to walk for the past one day or so.  Has chronic low back pain which is unchanged.  No fever, chills, nausea or vomiting. No chest pain or shortness of breath.   ED Course: Intact pulses BLE, WBC 12k, extensive venous stasis ulcers BLE.  AST 218 ALT 84.  UA with large HGB >50 RBCs, 6-10 WBCs.  Started on zosyn / vanc for leg wounds / cellulitis.  MRI L spine ordered and pending.   Assessment & Plan:   Principal Problem:   Bilateral leg weakness Active Problems:   HTN (hypertension)   Venous stasis ulcer (HCC)  #1 bilateral lower extremity cellulitis/wounds with ischemic petechiae and purpuric blisters secondary to venous and lymphatic insufficiency Per wife ongoing for the past 5 years and usually seems to improve in the fall and winter months.  Patient noted on admission to have a leukocytosis and concern for ongoing infection.  Continue IV Vanco and Zosyn.  Orthopedics consulted who are recommending compression socks, elevation of lower extremities, protein supplementation via nutrition consult.  Orthopedics following and appreciate their input and recommendations.  2.  Abnormal MRI findings/bilateral lower extremity weakness. Patient seen in consultation by neurosurgery who feel no emergent or urgent surgical needs identified at this time.  Per neurosurgery patient is being managed by his chiropractor and will follow-up in the outpatient setting.   PT/OT.  3. Bacteria in urine Urine cultures with < 10,000 colonies with insignificant growth.  Patient asymptomatic.  Follow.  4.  Hypertension Blood pressure stable.  Continue home regimen of Norvasc and Lotensin and HCTZ and Toprol-XL.  5.  Transaminitis LFTs were trending down.  Will repeat LFTs in the morning.  May need right upper quadrant ultrasound however patient currently asymptomatic will follow.    DVT prophylaxis: Lovenox Code Status: Full Family Communication: Updated patient.  Updated wife at bedside. Disposition Plan: Likely home when clinically improved when okay with orthopedics hopefully in the next 1 to 2 days and when on oral antibiotics.   Consultants:   Orthopedics: Dr. Sharol Given 11/13/2018  Neurosurgery: Dr. Christella Noa 11/13/2018  Procedures:   MRI of the L-spine 11/13/2018  Plain films of the eye 11/13/2018  Chest x-ray 11/13/2018  Plain films of the left foot 11/13/2018  Plain films of the right foot 11/13/2018 plain films of bilateral tib-fib 11/13/2018  Antimicrobials:   IV Zosyn 11/13/2018  IV vancomycin 11/13/2018   Subjective: Patient laying in bed.  Feels some improvement with his weakness as he was able to ambulate in the room with physical therapy.  Denies any chest pain or shortness of breath.  Objective: Vitals:   11/13/18 2141 11/13/18 2200 11/13/18 2326 11/14/18 0735  BP:   109/67 119/68  Pulse:  83 84 95  Resp:  20 20 11   Temp: 97.6 F (36.4 C)  98.6 F (37 C) 98.3 F (36.8 C)  TempSrc: Oral  Oral  Oral  SpO2:  97% 90% 100%  Height:        Intake/Output Summary (Last 24 hours) at 11/14/2018 1319 Last data filed at 11/14/2018 0500 Gross per 24 hour  Intake 1068.6 ml  Output 1875 ml  Net -806.4 ml   There were no vitals filed for this visit.  Examination:  General exam: NAD. Respiratory system: CTA B.  No wheezes, no crackles, no rhonchi.  Normal respiratory effort.   Cardiovascular system: Regular rate rhythm no murmurs rubs  or gallops.  No JVD.  No lower extremity edema.  Gastrointestinal system: Abdomen is soft, nontender, nondistended, positive bowel sounds.  No rebound.  No guarding. Central nervous system: Alert and oriented. No focal neurological deficits. Extremities: Symmetric 5 x 5 power. Skin:  No rashes, lesions or ulcers Psychiatry: Judgement and insight appear normal. Mood & affect appropriate.              Data Reviewed: I have personally reviewed following labs and imaging studies  CBC: Recent Labs  Lab 11/12/18 2045 11/13/18 0341 11/14/18 0804  WBC 12.4* 12.4* 11.7*  NEUTROABS 10.2*  --  9.5*  HGB 11.9* 11.4* 10.8*  HCT 37.4* 35.2* 33.2*  MCV 93.3 91.9 92.0  PLT 439* 410* 123XX123*   Basic Metabolic Panel: Recent Labs  Lab 11/12/18 2045 11/13/18 0341 11/14/18 0804  NA 134* 139 140  K 3.6 3.7 4.1  CL 98 104 105  CO2 26 24 27   GLUCOSE 144* 118* 106*  BUN 26* 28* 23  CREATININE 1.04 0.96 1.01  CALCIUM 8.7* 8.6* 8.3*  MG  --   --  1.8   GFR: CrCl cannot be calculated (Unknown ideal weight.). Liver Function Tests: Recent Labs  Lab 11/12/18 2045 11/13/18 0341  AST 218* 186*  ALT 84* 81*  ALKPHOS 72 69  BILITOT 0.6 0.8  PROT 7.2 6.9  ALBUMIN 2.5* 2.4*   No results for input(s): LIPASE, AMYLASE in the last 168 hours. No results for input(s): AMMONIA in the last 168 hours. Coagulation Profile: No results for input(s): INR, PROTIME in the last 168 hours. Cardiac Enzymes: Recent Labs  Lab 11/13/18 0341 11/14/18 0804  CKTOTAL 3,156* 1,149*   BNP (last 3 results) No results for input(s): PROBNP in the last 8760 hours. HbA1C: No results for input(s): HGBA1C in the last 72 hours. CBG: No results for input(s): GLUCAP in the last 168 hours. Lipid Profile: No results for input(s): CHOL, HDL, LDLCALC, TRIG, CHOLHDL, LDLDIRECT in the last 72 hours. Thyroid Function Tests: Recent Labs    11/13/18 0341  TSH 1.205   Anemia Panel: No results for input(s):  VITAMINB12, FOLATE, FERRITIN, TIBC, IRON, RETICCTPCT in the last 72 hours. Sepsis Labs: Recent Labs  Lab 11/12/18 2044 11/13/18 0049  LATICACIDVEN 1.4 1.4    Recent Results (from the past 240 hour(s))  Blood culture (routine x 2)     Status: None (Preliminary result)   Collection Time: 11/13/18 12:30 AM   Specimen: BLOOD RIGHT HAND  Result Value Ref Range Status   Specimen Description BLOOD RIGHT HAND  Final   Special Requests   Final    BOTTLES DRAWN AEROBIC AND ANAEROBIC Blood Culture results may not be optimal due to an inadequate volume of blood received in culture bottles   Culture   Final    NO GROWTH 1 DAY Performed at [facility], [place] [address] [place], [place] [address]    Report Status PENDING  Incomplete  Blood culture (routine  x 2)     Status: None (Preliminary result)   Collection Time: 11/13/18 12:50 AM   Specimen: BLOOD RIGHT WRIST  Result Value Ref Range Status   Specimen Description BLOOD RIGHT WRIST  Final   Special Requests   Final    BOTTLES DRAWN AEROBIC AND ANAEROBIC Blood Culture adequate volume   Culture   Final    NO GROWTH 1 DAY Performed at [facility], [place] [address], [place], [place] [address]    Report Status PENDING  Incomplete  Urine Culture     Status: Abnormal   Collection Time: 11/13/18 12:50 AM   Specimen: Urine, Random  Result Value Ref Range Status   Specimen Description URINE, RANDOM  Final   Special Requests NONE  Final   Culture (A)  Final    <10,000 COLONIES/mL INSIGNIFICANT GROWTH Performed at [facility], [place] [address] [place], [place] [address]    Report Status 11/14/2018 FINAL  Final  SARS CORONAVIRUS 2 (TAT 6-24 HRS) Nasopharyngeal Nasopharyngeal Swab     Status: None   Collection Time: 11/13/18  2:28 AM   Specimen: Nasopharyngeal Swab  Result Value Ref Range Status   SARS Coronavirus 2 NEGATIVE NEGATIVE Final    Comment: (NOTE) SARS-CoV-2 target nucleic acids are NOT DETECTED. The SARS-CoV-2  RNA is generally detectable in upper and lower respiratory specimens during the acute phase of infection. Negative results do not preclude SARS-CoV-2 infection, do not rule out co-infections with other pathogens, and should not be used as the sole basis for treatment or other patient management decisions. Negative results must be combined with clinical observations, patient history, and epidemiological information. The expected result is Negative. Fact Sheet for Patients: SugarRoll.be Fact Sheet for Healthcare Providers: https://www.woods-mathews.com/ This test is not yet approved or cleared by the [place] FDA and  has been authorized for detection and/or diagnosis of SARS-CoV-2 by FDA under an Emergency Use Authorization (EUA). This EUA will remain  in effect (meaning this test can be used) for the duration of the COVID-19 declaration under Section 56 4(b)(1) of the Act, 21 U.S.C. section 360bbb-3(b)(1), unless the authorization is terminated or revoked sooner. Performed at [facility], [place] [address], [place],  [address]          Radiology Studies: Dg Eye Foreign Body  Result Date: 11/13/2018 CLINICAL DATA:  Metal working/exposure; clearance prior to MRI EXAM: ORBITS FOR FOREIGN BODY - 2 VIEW COMPARISON:  None. FINDINGS: There is no evidence of metallic foreign body within the orbits. No significant bone abnormality identified. IMPRESSION: No evidence of metallic foreign body within the orbits. Electronically Signed   By: Kathreen Devoid   On: 11/13/2018 05:00   Dg Chest 2 View  Result Date: 11/13/2018 CLINICAL DATA:  Weakness EXAM: CHEST - 2 VIEW COMPARISON:  10/15/2009 FINDINGS: The heart size and mediastinal contours are within normal limits. Both lungs are clear. The visualized skeletal structures are unremarkable. IMPRESSION: No active cardiopulmonary disease. Electronically Signed   By: Ulyses Jarred M.D.   On:  11/13/2018 01:44   Dg Tibia/fibula Left  Result Date: 11/13/2018 CLINICAL DATA:  Left leg skin lesions EXAM: LEFT TIBIA AND FIBULA - 2 VIEW COMPARISON:  None. FINDINGS: No osseous erosion or soft tissue emphysema. There is severe tricompartmental left knee osteoarthrosis. No fracture. IMPRESSION: Severe tricompartmental left knee osteoarthrosis. No soft tissue emphysema. Electronically Signed   By: Ulyses Jarred M.D.   On: 11/13/2018 01:42   Dg Tibia/fibula  Right  Result Date: 11/13/2018 CLINICAL DATA:  Leg pain EXAM: RIGHT TIBIA AND FIBULA - 2 VIEW COMPARISON:  None. FINDINGS: Mild tricompartmental osteoarthrosis with chondrocalcinosis in the femorotibial joint space. No knee effusion. No osseous erosion. There are vascular calcifications. IMPRESSION: Mild tricompartmental osteoarthrosis of the right knee. No acute abnormality of the right tibia or fibula. Electronically Signed   By: Ulyses Jarred M.D.   On: 11/13/2018 01:55   Mr Lumbar Spine Wo Contrast  Result Date: 11/13/2018 CLINICAL DATA:  Worsening lower extremity weakness EXAM: MRI LUMBAR SPINE WITHOUT CONTRAST TECHNIQUE: Multiplanar, multisequence MR imaging of the lumbar spine was performed. No intravenous contrast was administered. COMPARISON:  None. FINDINGS: Segmentation:  Normal Alignment:  Grade 1 retrolisthesis at L2-3. Vertebrae: Heterogeneous bone marrow signal. Mild edema at the L4 inferior endplate without significant height loss. No retropulsion. Conus medullaris and cauda equina: Conus extends to the L1 level. Conus and cauda equina appear normal. Paraspinal and other soft tissues: Negative Disc levels: T12-L1: Small right subarticular disc protrusion without spinal canal or neural foraminal stenosis. L1-2: Disc space narrowing without focal herniation. No spinal canal or neural foraminal stenosis. Normal facets. L2-3: Intermediate disc osteophyte complex. No spinal canal stenosis. Moderate right and mild left foraminal stenosis.  L3-4: Disc desiccation with mild bulge. Left-greater-than-right lateral recess narrowing. No central spinal canal or neural foraminal stenosis. L4-5: Left subarticular disc protrusion superimposed on mild bulge. Mild facet hypertrophy. There is mild narrowing of the left lateral recess with mild bilateral neural foraminal stenosis. L5-S1: Large right-sided osteophyte in close proximity to the exiting right L5 nerve root. No central spinal canal stenosis. Mild left foraminal stenosis. IMPRESSION: 1. Mild edema at the L4 inferior endplate without height loss. This may indicate a subacute fracture and could be a source of local low back pain. Correlate for recent fall or other trauma. 2. No central spinal canal stenosis. 3. Multilevel mild lateral recess and neural foraminal stenosis. 4. Large right-sided osteophyte at L5-S1 in close proximity to the exiting right L5 nerve root. Correlate for corresponding radiculopathy. Electronically Signed   By: Ulyses Jarred M.D.   On: 11/13/2018 05:58   Dg Foot Complete Left  Result Date: 11/13/2018 CLINICAL DATA:  Skin lesions EXAM: LEFT FOOT - COMPLETE 3+ VIEW COMPARISON:  None. FINDINGS: No osseous erosion or soft tissue emphysema. No fracture or dislocation. There are vascular calcifications. The bones are mildly osteopenic. IMPRESSION: No radiographic evidence of osteomyelitis. No fracture or dislocation of the left foot. Electronically Signed   By: Ulyses Jarred M.D.   On: 11/13/2018 01:54   Dg Foot Complete Right  Result Date: 11/13/2018 CLINICAL DATA:  Lower extremity skin lesions EXAM: RIGHT FOOT COMPLETE - 3+ VIEW COMPARISON:  None. FINDINGS: Bones are osteopenic. There is no focal osseous erosion. No fracture or dislocation. Mild midfoot osteoarthrosis. IMPRESSION: 1. No radiographic evidence of osteomyelitis in the right foot. Mild midfoot osteoarthrosis. 2. Osteopenia. Electronically Signed   By: Ulyses Jarred M.D.   On: 11/13/2018 01:55         Scheduled Meds: . amLODipine  10 mg Oral QHS   And  . benazepril  40 mg Oral QHS  . aspirin  81 mg Oral QPM  . atorvastatin  20 mg Oral QPM  . cholecalciferol  1,000 Units Oral BID  . enoxaparin (LOVENOX) injection  40 mg Subcutaneous Q24H  . feeding supplement (ENSURE ENLIVE)  237 mL Oral BID BM  . feeding supplement (PRO-STAT SUGAR FREE 64)  30  mL Oral BID  . ferrous gluconate  324 mg Oral Daily  . hydrochlorothiazide  25 mg Oral Daily  . metoprolol succinate  100 mg Oral QHS  . sodium chloride flush  3 mL Intravenous Once   Continuous Infusions: . piperacillin-tazobactam (ZOSYN)  IV 3.375 g (11/14/18 0842)  . vancomycin 2,500 mg (11/14/18 0316)     LOS: 0 days    Time spent: 35 minutes    Irine Seal, MD [facility]  If 7PM-7AM, please contact night-coverage [email or web address] Password [email or web address] 11/14/2018, 1:19 PM

## 2018-11-14 NOTE — Progress Notes (Signed)
Initial Nutrition Assessment  INTERVENTION:   -Ensure Enlive po BID, each supplement provides 350 kcal and 20 grams of protein -Prostat liquid protein PO 30 ml BID with meals, each supplement provides 100 kcal, 15 grams protein.  NUTRITION DIAGNOSIS:   Increased nutrient needs related to wound healing as evidenced by estimated needs.  GOAL:   Patient will meet greater than or equal to 90% of their needs  MONITOR:   PO intake, Supplement acceptance, Labs, Weight trends, I & O's, Skin  REASON FOR ASSESSMENT:   Consult Wound healing  ASSESSMENT:   83 y.o. male who presents with chronic venous ulcerations both lower extremities.  **RD working remotely**  Patient with possible flea/chigger bites to legs. Per WOC note, was not convinced that ulcers on pt's legs were related to venous insufficiency. Pt has developed BLE weakness. Per nursing documentation, pt with moderate LE edema.  No PO documented for this admission yet. Prostat supplements have been ordered, pt accepting them currently. Will add Ensure supplements for additional kcal and protein.   No weight has been measured for this admission. Per care everywhere records, last recorded weight found was 247 lbs on 9/15. RD used IBW for estimated needs below. I/Os: -610 ml UOP: 400 ml so far today.  Labs reviewed. Medications: vitamin D tablet, Fergon tablet  NUTRITION - FOCUSED PHYSICAL EXAM:  Unable to perform- working remotely.  Diet Order:   Diet Order            Diet Heart Room service appropriate? Yes; Fluid consistency: Thin  Diet effective now              EDUCATION NEEDS:   No education needs have been identified at this time  Skin:  Skin Assessment: Skin Integrity Issues: Skin Integrity Issues:: Other (Comment) Other: venous stasis ulcer, possible flea/chigger bites on LEs  Last BM:  PTA  Height:   Ht Readings from Last 1 Encounters:  11/13/18 6\' 4"  (1.93 m)    Weight:   Wt Readings from  Last 1 Encounters:  04/20/18 117 kg    Ideal Body Weight:  91.8 kg  BMI:  Body mass index is 31.4 kg/m.  Estimated Nutritional Needs:   Kcal:  2200-2400  Protein:  110-120g  Fluid:  2.2L/day  Clayton Bibles, MS, RD, LDN Inpatient Clinical Dietitian Pager: [phone] After Hours Pager: [phone]

## 2018-11-14 NOTE — Progress Notes (Signed)
OT Cancellation Note  Patient Details Name: [name] MRN: [id number] DOB: [birth year]   Cancelled Treatment:    Reason Eval/Treat Not Completed: Other (comment). Pt reports he is too tired right now to get up again (even though wife reports it has been at least an hour since PT saw pt). We re-attempt tomorrow.  [name], OTR/L Acute Rehab Services Pager [phone] Office [phone]     Almon Register 11/14/2018, 2:43 PM

## 2018-11-14 NOTE — Plan of Care (Signed)
  Problem: Education: Goal: Knowledge of General Education information will improve Description: Including pain rating scale, medication(s)/side effects and non-pharmacologic comfort measures Outcome: Progressing   Problem: Health Behavior/Discharge Planning: Goal: Ability to manage health-related needs will improve Outcome: Progressing   Problem: Clinical Measurements: Goal: Ability to maintain clinical measurements within normal limits will improve Outcome: Progressing Goal: Will remain free from infection Outcome: Progressing Goal: Diagnostic test results will improve Outcome: Progressing Goal: Respiratory complications will improve Outcome: Progressing Goal: Cardiovascular complication will be avoided Outcome: Progressing   Problem: Elimination: Goal: Will not experience complications related to bowel motility Outcome: Progressing Goal: Will not experience complications related to urinary retention Outcome: Progressing   Problem: Pain Managment: Goal: General experience of comfort will improve Outcome: Progressing   Problem: Safety: Goal: Ability to remain free from injury will improve Outcome: Progressing   Problem: Pain Managment: Goal: General experience of comfort will improve Outcome: Progressing   Problem: Elimination: Goal: Will not experience complications related to bowel motility Outcome: Progressing Goal: Will not experience complications related to urinary retention Outcome: Progressing   Problem: Safety: Goal: Ability to remain free from injury will improve Outcome: Progressing

## 2018-11-14 NOTE — TOC Initial Note (Signed)
Transition of Care [facility]) - Initial/Assessment Note    Patient Details  Name: [name] MRN: [id number] Date of Birth: [birth year]  [facility] CM/SW Contact:    Carles Collet, RN Phone Number: 11/14/2018, 10:51 AM  Clinical Narrative:                [name] w patient. He states that he lives at home w his wife and son. He has canes. Walkers "you name it," declines need for additional DME. He states he had an appointment with a [facility] RN to change his dressings for his legs, he could not remember the name of the agency, but states his wife may.  He is currently in observation and [facility] services will continue w/o interruption. If he changed to inpatient status, he will need resumption orders and [place] notified of admission. No other CM needs identified at this time.    Expected Discharge Plan: [place] Barriers to Discharge: Continued Medical Work up   Patient Goals and CMS Choice Patient states their goals for this hospitalization and ongoing recovery are:: to go home   Choice offered to / list presented to : NA  Expected Discharge Plan and Services Expected Discharge Plan: [place]                                              Prior Living Arrangements/Services   Lives with:: Spouse, Adult Children Patient language and need for interpreter reviewed:: Yes Do you feel safe going back to the place where you live?: Yes            Criminal Activity/Legal Involvement Pertinent to Current Situation/Hospitalization: No - Comment as needed  Activities of Daily Living      Permission Sought/Granted                  Emotional Assessment              Admission diagnosis:  Wounds, multiple [T07.XXXA] Weakness of both lower extremities [R29.898] Metal plate in left side of face [Z96.7] Patient Active Problem List   Diagnosis Date Noted  . Bilateral leg weakness 11/13/2018  . Venous stasis ulcer ([place])  11/13/2018  . Petechial rash   . 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 Wounds, multiple   . Severe protein-calorie malnutrition ([place])   . Abnormal EKG 04/20/2018  . Abdominal aortic aneurysm (AAA) without rupture ([place]) 04/20/2018  . Dyslipidemia 03/24/2017  . HTN (hypertension) 06/02/2010  . Arthritis 06/02/2010  . CAD (coronary artery disease) 06/02/2010  . Colon cancer ([place]) 06/02/2010  . Pseudo-gout 06/02/2010  . DYSLIPIDEMIA 12/12/2008  . HYPERTENSION, UNSPECIFIED 04/30/2008  . CAD, UNSPECIFIED SITE 04/30/2008   PCP:  Renne Crigler, NP Pharmacy:   [facility] - [address] [place] [address] Phone: [phone] Fax: [phone]  [facility] [address] [place] [address] [place] [address] Phone: [phone] Fax: [phone]     Social Determinants of Health (SDOH) Interventions    Readmission Risk Interventions No flowsheet data found.

## 2018-11-14 NOTE — Progress Notes (Signed)
Rehab Admissions Coordinator Note:  Per PT recommendation, this patient was screened by Raechel Ache for appropriateness for an Inpatient Acute Rehab Consult.  At this time, based on current admitting diagnosis, it is unclear if this patient has the medical necessity to warrant an IP Rehab stay. Will follow up tomorrow after next therapy session to review tolerance and determine if CIR consult appropriate.   Raechel Ache 11/14/2018, 5:42 PM  I can be reached at [phone].

## 2018-11-15 LAB — COMPREHENSIVE METABOLIC PANEL
ALT: 73 U/L — ABNORMAL HIGH (ref 0–44)
AST: 91 U/L — ABNORMAL HIGH (ref 15–41)
Albumin: 2.1 g/dL — ABNORMAL LOW (ref 3.5–5.0)
Alkaline Phosphatase: 57 U/L (ref 38–126)
Anion gap: 11 (ref 5–15)
BUN: 32 mg/dL — ABNORMAL HIGH (ref 8–23)
CO2: 30 mmol/L (ref 22–32)
Calcium: 8.3 mg/dL — ABNORMAL LOW (ref 8.9–10.3)
Chloride: 99 mmol/L (ref 98–111)
Creatinine, Ser: 1.3 mg/dL — ABNORMAL HIGH (ref 0.61–1.24)
GFR calc Af Amer: 59 mL/min — ABNORMAL LOW (ref >60)
GFR calc non Af Amer: 51 mL/min — ABNORMAL LOW (ref >60)
Glucose, Bld: 162 mg/dL — ABNORMAL HIGH (ref 70–99)
Potassium: 4 mmol/L (ref 3.5–5.1)
Sodium: 140 mmol/L (ref 135–145)
Total Bilirubin: 0.3 mg/dL (ref 0.3–1.2)
Total Protein: 6.2 g/dL — ABNORMAL LOW (ref 6.5–8.1)

## 2018-11-15 LAB — CBC WITH DIFFERENTIAL/PLATELET
Abs Immature Granulocytes: 0.06 10*3/uL (ref 0.00–0.07)
Basophils Absolute: 0 10*3/uL (ref 0.0–0.1)
Basophils Relative: 0 %
Eosinophils Absolute: 0 10*3/uL (ref 0.0–0.5)
Eosinophils Relative: 0 %
HCT: 33.7 % — ABNORMAL LOW (ref 39.0–52.0)
Hemoglobin: 10.7 g/dL — ABNORMAL LOW (ref 13.0–17.0)
Immature Granulocytes: 1 %
Lymphocytes Relative: 3 %
Lymphs Abs: 0.4 10*3/uL — ABNORMAL LOW (ref 0.7–4.0)
MCH: 29.8 pg (ref 26.0–34.0)
MCHC: 31.8 g/dL (ref 30.0–36.0)
MCV: 93.9 fL (ref 80.0–100.0)
Monocytes Absolute: 0.8 10*3/uL (ref 0.1–1.0)
Monocytes Relative: 7 %
Neutro Abs: 9.8 10*3/uL — ABNORMAL HIGH (ref 1.7–7.7)
Neutrophils Relative %: 89 %
Platelets: 404 10*3/uL — ABNORMAL HIGH (ref 150–400)
RBC: 3.59 MIL/uL — ABNORMAL LOW (ref 4.22–5.81)
RDW: 13.2 % (ref 11.5–15.5)
WBC: 11 10*3/uL — ABNORMAL HIGH (ref 4.0–10.5)
nRBC: 0 % (ref 0.0–0.2)

## 2018-11-15 MED ORDER — SODIUM CHLORIDE 0.9 % IV SOLN
INTRAVENOUS | Status: DC
  Administered 2018-11-15: 100 mL/h via INTRAVENOUS
  Administered 2018-11-15 – 2018-11-16 (×2): via INTRAVENOUS

## 2018-11-15 MED ORDER — VANCOMYCIN HCL 10 G IV SOLR: 1500.0000 mg | INTRAVENOUS | Status: DC

## 2018-11-15 NOTE — Consult Note (Signed)
Inpatient Rehabilitation Admissions Coordinator  Inpatient rehab consult received. I met with patient with his wife at bedside for rehab assessment. Pt currently with some SOB with talking. Wife has numerous questions concerning diagnosis and is asking for come clarification form MD. Pt currently not at a level to tolerate the intensity of an inpt rehab admit. I will follow his progress.  Danne Baxter, RN, MSN Rehab Admissions Coordinator [phone] 11/15/2018 1:32 PM

## 2018-11-15 NOTE — Progress Notes (Addendum)
Occupational Therapy Evaluation Patient Details Name: [name] MRN: [id number] DOB: 11-28-1935 Today's Date: 11/15/2018    History of Present Illness [name] is a 83 y.o. male with medical history significant of CAD, colon Ca, BLE lymph edema and venous stasis ulcers.Patient was hospitalized at [facility]2 days ago for 1 day after worsening wounds to leg.  Sent home with script for bactrim for UTI which he didn't fill due to sulfa allergy.After going home patient has been unable to stand due to weakness in his legs and unable to walk for the past one day or so.  Has chronic low back pain which is unchanged.   Clinical Impression   PTA, pt was living at home with his wife, and was independent with ADL/IADL and functional mobilty. Pt currently requires heavy maxA to stand from EOB and take side steps alongside HOB. While standing pt's bilat knees began to bleed from wounds that scabbed over. Pt on 6lnc throughout session, following bed mobility SpO2 dropped to 83%, returned to 94% with 51min pursed lip breathing. Due to decline in current level of function, pt would benefit from acute OT to address established goals to facilitate safe D/C to venue listed below. At this time, recommend CIR follow-up. Will continue to follow acutely.     Follow Up Recommendations  CIR    Equipment Recommendations  3 in 1 bedside commode    Recommendations for Other Services       Precautions / Restrictions Precautions Precautions: Fall Precaution Comments: monitor for bandages on legs to avoid pulling them off with movement(monitor O2 sats with all mobility) Restrictions Weight Bearing Restrictions: No      Mobility Bed Mobility Overal bed mobility: Needs Assistance Bed Mobility: Supine to Sit;Sit to Supine     Supine to sit: Max assist;HOB elevated Sit to supine: Max assist   General bed mobility comments: maxA for BLE management  Transfers Overall transfer level: Needs  assistance Equipment used: Rolling walker (2 wheeled);1 person hand held assist Transfers: Sit to/from Stand Sit to Stand: Max assist         General transfer comment: heavy maxA to powerup and for stability     Balance Overall balance assessment: History of Falls;Needs assistance Sitting-balance support: Feet supported;Bilateral upper extremity supported Sitting balance-Leahy Scale: Fair     Standing balance support: Bilateral upper extremity supported;During functional activity Standing balance-Leahy Scale: Poor Standing balance comment: poor with heavy reliance on RW                           ADL either performed or assessed with clinical judgement   ADL Overall ADL's : Needs assistance/impaired Eating/Feeding: Set up;Sitting   Grooming: Set up;Sitting   Upper Body Bathing: Min guard;Sitting   Lower Body Bathing: Maximal assistance;Sit to/from stand   Upper Body Dressing : Min guard;Sitting   Lower Body Dressing: Maximal assistance;Sit to/from stand   Toilet Transfer: Maximal assistance;RW;+2 for physical assistance Toilet Transfer Details (indicate cue type and reason): pt required maxA +1 for sit<>stand unable to tolerate pivot with one person would require additional support         Functional mobility during ADLs: Maximal assistance;Rolling walker General ADL Comments: poor activity tolerance;pt on 6lnc, SpO2 94% supine, 83% with bed mobility, returned to 94% with 1 mn pursed lip breathing      Vision         Perception     Praxis  Pertinent Vitals/Pain Pain Assessment: Faces Faces Pain Scale: Hurts whole lot Pain Location: B lower legs Pain Descriptors / Indicators: Grimacing Pain Intervention(s): Limited activity within patient's tolerance;Repositioned;Monitored during session     Hand Dominance Right   Extremity/Trunk Assessment Upper Extremity Assessment Upper Extremity Assessment: Generalized weakness   Lower Extremity  Assessment Lower Extremity Assessment: Generalized weakness   Cervical / Trunk Assessment Cervical / Trunk Assessment: Other exceptions(retrolisthesis L2-3, L4 subacute fracture, L5-S1 osteophytes) Cervical / Trunk Exceptions: may have impingement on nerve root at L5 from osteophytes   Communication Communication Communication: HOH   Cognition Arousal/Alertness: Awake/alert Behavior During Therapy: WFL for tasks assessed/performed Overall Cognitive Status: Within Functional Limits for tasks assessed                                     General Comments  pt unable to take steps away from bed due to weakness and buckling of knees;while standing, pt's wounds on Bilat knees began to bleed, anticipate skin stretched out with standing causing bleeding, RN aware and present at end of session     Exercises     Shoulder Instructions      Home Living Family/patient expects to be discharged to:: Private residence Living Arrangements: Spouse/significant other Available Help at Discharge: Family;Available 24 hours/day Type of Home: House       Home Layout: One level         Bathroom Toilet: Standard     Home Equipment: Environmental consultant - 2 wheels;Walker - 4 wheels;Cane - quad;Other (comment)(2 canes at home)          Prior Functioning/Environment Level of Independence: Independent with assistive device(s)        Comments: no recent falls until this UTI occurred        OT Problem List: Decreased strength;Decreased range of motion;Decreased activity tolerance;Impaired balance (sitting and/or standing);Decreased safety awareness;Decreased knowledge of use of DME or AE;Decreased knowledge of precautions;Pain      OT Treatment/Interventions: Self-care/ADL training;Therapeutic exercise;DME and/or AE instruction;Energy conservation;Therapeutic activities;Patient/family education;Balance training    OT Goals(Current goals can be found in the care plan section) Acute Rehab OT  Goals Patient Stated Goal: to get stronger and get home OT Goal Formulation: With patient Time For Goal Achievement: 11/29/18 Potential to Achieve Goals: Good ADL Goals Pt Will Perform Grooming: with min guard assist;standing;sitting Pt Will Perform Upper Body Dressing: with min guard assist Pt Will Perform Lower Body Dressing: with min guard assist;with adaptive equipment;sit to/from stand Pt Will Transfer to Toilet: with min guard assist;ambulating Pt Will Perform Toileting - Clothing Manipulation and hygiene: with min guard assist;sit to/from stand  OT Frequency: Min 2X/week   Barriers to D/C:            Co-evaluation              AM-PAC OT "6 Clicks" Daily Activity     Outcome Measure Help from another person eating meals?: A Little Help from another person taking care of personal grooming?: A Little Help from another person toileting, which includes using toliet, bedpan, or urinal?: A Lot Help from another person bathing (including washing, rinsing, drying)?: A Lot Help from another person to put on and taking off regular upper body clothing?: A Little Help from another person to put on and taking off regular lower body clothing?: A Lot 6 Click Score: 15   End of Session Equipment Utilized During Treatment: Gait  belt;Rolling walker;Oxygen Nurse Communication: Mobility status  Activity Tolerance: Patient tolerated treatment well;Patient limited by fatigue Patient left: in bed;with call bell/phone within reach;with bed alarm set;with nursing/sitter in room  OT Visit Diagnosis: Unsteadiness on feet (R26.81);Other abnormalities of gait and mobility (R26.89);Muscle weakness (generalized) (M62.81);History of falling (Z91.81);Pain Pain - Right/Left: (bilat) Pain - part of body: Leg                Time: 1536-1620 OT Time Calculation (min): 44 min Charges:  OT General Charges $OT Visit: 1 Visit OT Evaluation $OT Eval Moderate Complexity: 1 Mod OT Treatments $Self  Care/Home Management : 23-37 mins  Dorinda Hill OTR/L Acute Rehabilitation Services Office: [place] 11/15/2018, 4:26 PM

## 2018-11-15 NOTE — Progress Notes (Signed)
Patient seen in follow-up for petechial rashes both legs with venous insufficiency and venous ulcers both legs.  Patient's legs are wrapped in Kerlix.  A 2 layer medical compression stocking was delivered to patient's room.  Patient will wear the single layer sock against the wound 24 hours a day and wears a sleeve on top of the sock during waking hours.  The sleeve is to be removed at night both the sock and sleeve removed in the morning and changed out to a new sock and sleeve.  Patient has 6 socks and sleeves.  The should be saved so patient can reuse them once they are washed.

## 2018-11-15 NOTE — Progress Notes (Signed)
PROGRESS NOTE    [name]  [id number] DOB: August 15, 1935 DOA: 11/12/2018 PCP: Renne Crigler, NP    Brief Narrative:  HPI per Dr. Jori Moll is a 83 y.o. male with medical history significant of CAD, colon Ca, BLE lymph edema and venous stasis ulcers.  Patient was hospitalized at [facility] x2 days ago for 1 day after worsening wounds to leg.  Sent home with script for bactrim for UTI which he didn't fill due to sulfa allergy.  After going home patient has been unable to stand due to weakness in his legs and unable to walk for the past one day or so.  Has chronic low back pain which is unchanged.  No fever, chills, nausea or vomiting. No chest pain or shortness of breath.   ED Course: Intact pulses BLE, WBC 12k, extensive venous stasis ulcers BLE.  AST 218 ALT 84.  UA with large HGB >50 RBCs, 6-10 WBCs.  Started on zosyn / vanc for leg wounds / cellulitis.  MRI L spine ordered and pending.   Assessment & Plan:   Principal Problem:   Bilateral leg weakness Active Problems:   HTN (hypertension)   Venous stasis ulcer (HCC)   Weakness of both lower extremities   Abnormal MRI   Bacteria in urine   Transaminitis  1 bilateral lower extremity cellulitis/wounds with ischemic petechiae and purpuric blisters secondary to venous and lymphatic insufficiency Per wife ongoing for the past 5 years and usually seems to improve in the fall and winter months.  Patient noted on admission to have a leukocytosis and concern for ongoing infection.  Continue IV Zosyn.  DC IV vancomycin.  Likely narrowed to IV Ancef tomorrow.  Orthopedics consulted who recommended compression socks that have been placed this morning.  Keep lower extremity elevated.  Protein supplementation via nutrition consult. Orthopedics following and appreciate their input and recommendations.  2.  Abnormal MRI findings/bilateral lower extremity weakness. Patient seen in consultation by neurosurgery who feel no  emergent or urgent surgical needs identified at this time.  Per neurosurgery patient is being managed by his chiropractor and will follow-up in the outpatient setting.  PT/OT.  3. Bacteria in urine Urine cultures with < 10,000 colonies with insignificant growth.  Patient asymptomatic.  Follow.  4.  Hypertension Blood pressure somewhat borderline.  Slight bump in creatinine.  Discontinue ACE inhibitor and diuretic.  Continue Norvasc and Toprol-XL.  Follow.   5.  Transaminitis LFTs were trending down.  May need right upper quadrant ultrasound however patient currently asymptomatic will follow LFTs.    DVT prophylaxis: Lovenox Code Status: Full Family Communication: Updated patient.  No family at bedside.  Disposition Plan: CIR versus home with home health.    Consultants:   Orthopedics: Dr. Sharol Given 11/13/2018  Neurosurgery: Dr. Christella Noa 11/13/2018  Procedures:   MRI of the L-spine 11/13/2018  Plain films of the eye 11/13/2018  Chest x-ray 11/13/2018  Plain films of the left foot 11/13/2018  Plain films of the right foot 11/13/2018 plain films of bilateral tib-fib 11/13/2018  Antimicrobials:   IV Zosyn 11/13/2018  IV vancomycin 11/13/2018 >>>> 11/15/2018   Subjective: Patient lying in bed on the telephone.  Denies any chest pain or shortness of breath.  Feels weakness slowly improving.  States compression stockings just placed this morning.   Objective: Vitals:   11/15/18 0253 11/15/18 0303 11/15/18 0759 11/15/18 0834  BP:  116/67 113/62 112/65  Pulse:  80 74 78  Resp:  Marland Kitchen)  24 (!) 26 (!) 23  Temp:  97.9 F (36.6 C) 99.2 F (37.3 C)   TempSrc:   Oral   SpO2: (!) 67% 97% 100% 97%  Height:        Intake/Output Summary (Last 24 hours) at 11/15/2018 [id number] Last data filed at 11/14/2018 2108 Gross per 24 hour  Intake -  Output 1000 ml  Net -1000 ml   There were no vitals filed for this visit.  Examination:  General exam: NAD. Respiratory system: Lungs are to auscultation  bilaterally.  No wheezes, no crackles, no rhonchi.  Normal respiratory effort.   Cardiovascular system: RRR no murmurs rubs or gallops.  No JVD.  No lower extremity edema.  Gastrointestinal system: Abdomen is nontender, nondistended, soft, positive bowel sounds.  No rebound.  No guarding.  Central nervous system: Alert and oriented. No focal neurological deficits. Extremities: Symmetric 5 x 5 power. Skin: Lower extremities wrapped in compression. Psychiatry: Judgement and insight appear normal. Mood & affect appropriate.              Data Reviewed: I have personally reviewed following labs and imaging studies  CBC: Recent Labs  Lab 11/12/18 2045 11/13/18 0341 11/14/18 0804 11/15/18 0339  WBC 12.4* 12.4* 11.7* 11.0*  NEUTROABS 10.2*  --  9.5* 9.8*  HGB 11.9* 11.4* 10.8* 10.7*  HCT 37.4* 35.2* 33.2* 33.7*  MCV 93.3 91.9 92.0 93.9  PLT 439* 410* 401* [id number]*   Basic Metabolic Panel: Recent Labs  Lab 11/12/18 2045 11/13/18 0341 11/14/18 0804 11/15/18 0339  NA 134* 139 140 140  K 3.6 3.7 4.1 4.0  CL 98 104 105 99  CO2 26 24 27 30   GLUCOSE 144* 118* 106* 162*  BUN 26* 28* 23 32*  CREATININE 1.04 0.96 1.01 1.30*  CALCIUM 8.7* 8.6* 8.3* 8.3*  MG  --   --  1.8  --    GFR: CrCl cannot be calculated (Unknown ideal weight.). Liver Function Tests: Recent Labs  Lab 11/12/18 2045 11/13/18 0341 11/15/18 0339  AST 218* 186* 91*  ALT 84* 81* 73*  ALKPHOS 72 69 57  BILITOT 0.6 0.8 0.3  PROT 7.2 6.9 6.2*  ALBUMIN 2.5* 2.4* 2.1*   No results for input(s): LIPASE, AMYLASE in the last 168 hours. No results for input(s): AMMONIA in the last 168 hours. Coagulation Profile: No results for input(s): INR, PROTIME in the last 168 hours. Cardiac Enzymes: Recent Labs  Lab 11/13/18 0341 11/14/18 0804  CKTOTAL 3,156* 1,149*   BNP (last 3 results) No results for input(s): PROBNP in the last 8760 hours. HbA1C: No results for input(s): HGBA1C in the last 72 hours. CBG: No  results for input(s): GLUCAP in the last 168 hours. Lipid Profile: No results for input(s): CHOL, HDL, LDLCALC, TRIG, CHOLHDL, LDLDIRECT in the last 72 hours. Thyroid Function Tests: Recent Labs    11/13/18 0341  TSH 1.205   Anemia Panel: No results for input(s): VITAMINB12, FOLATE, FERRITIN, TIBC, IRON, RETICCTPCT in the last 72 hours. Sepsis Labs: Recent Labs  Lab 11/12/18 2044 11/13/18 0049  LATICACIDVEN 1.4 1.4    Recent Results (from the past 240 hour(s))  Blood culture (routine x 2)     Status: None (Preliminary result)   Collection Time: 11/13/18 12:30 AM   Specimen: BLOOD RIGHT HAND  Result Value Ref Range Status   Specimen Description BLOOD RIGHT HAND  Final   Special Requests   Final    BOTTLES DRAWN AEROBIC AND ANAEROBIC Blood Culture results may  not be optimal due to an inadequate volume of blood received in culture bottles   Culture   Final    NO GROWTH 1 DAY Performed at [facility], [place] [address] [place], [place] [address]    Report Status PENDING  Incomplete  Blood culture (routine x 2)     Status: None (Preliminary result)   Collection Time: 11/13/18 12:50 AM   Specimen: BLOOD RIGHT WRIST  Result Value Ref Range Status   Specimen Description BLOOD RIGHT WRIST  Final   Special Requests   Final    BOTTLES DRAWN AEROBIC AND ANAEROBIC Blood Culture adequate volume   Culture   Final    NO GROWTH 1 DAY Performed at [facility], [place] [address], [place], [place] [address]    Report Status PENDING  Incomplete  Urine Culture     Status: Abnormal   Collection Time: 11/13/18 12:50 AM   Specimen: Urine, Random  Result Value Ref Range Status   Specimen Description URINE, RANDOM  Final   Special Requests NONE  Final   Culture (A)  Final    <10,000 COLONIES/mL INSIGNIFICANT GROWTH Performed at [facility], [place] [address], [place], [place] [address]    Report Status 11/14/2018 FINAL  Final  SARS CORONAVIRUS 2 (TAT 6-24 HRS) Nasopharyngeal  Nasopharyngeal Swab     Status: None   Collection Time: 11/13/18  2:28 AM   Specimen: Nasopharyngeal Swab  Result Value Ref Range Status   SARS Coronavirus 2 NEGATIVE NEGATIVE Final    Comment: (NOTE) SARS-CoV-2 target nucleic acids are NOT DETECTED. The SARS-CoV-2 RNA is generally detectable in upper and lower respiratory specimens during the acute phase of infection. Negative results do not preclude SARS-CoV-2 infection, do not rule out co-infections with other pathogens, and should not be used as the sole basis for treatment or other patient management decisions. Negative results must be combined with clinical observations, patient history, and epidemiological information. The expected result is Negative. Fact Sheet for Patients: SugarRoll.be Fact Sheet for Healthcare Providers: https://www.woods-mathews.com/ This test is not yet approved or cleared by the [place] FDA and  has been authorized for detection and/or diagnosis of SARS-CoV-2 by FDA under an Emergency Use Authorization (EUA). This EUA will remain  in effect (meaning this test can be used) for the duration of the COVID-19 declaration under Section 56 4(b)(1) of the Act, 21 U.S.C. section 360bbb-3(b)(1), unless the authorization is terminated or revoked sooner. Performed at [facility], [place] [address], [place], [place] [address]          Radiology Studies: No results found.      Scheduled Meds: . amLODipine  10 mg Oral QHS   And  . benazepril  40 mg Oral QHS  . aspirin  81 mg Oral QPM  . atorvastatin  20 mg Oral QPM  . cholecalciferol  1,000 Units Oral BID  . enoxaparin (LOVENOX) injection  40 mg Subcutaneous Q24H  . feeding supplement (ENSURE ENLIVE)  237 mL Oral BID BM  . feeding supplement (PRO-STAT SUGAR FREE 64)  30 mL Oral BID  . ferrous gluconate  324 mg Oral Daily  . hydrochlorothiazide  25 mg Oral Daily  . metoprolol succinate  100 mg Oral  QHS  . sodium chloride flush  3 mL Intravenous Once   Continuous Infusions: . sodium chloride 100 mL/hr (11/15/18 0826)  . piperacillin-tazobactam (ZOSYN)  IV 3.375 g (11/15/18 [id number])  . vancomycin 2,500 mg (11/15/18  [id number])     LOS: 1 day    Time spent: 35 minutes    Irine Seal, MD [facility]  If 7PM-7AM, please contact night-coverage [email or web address] Password [email or web address] 11/15/2018, 9:39 AM

## 2018-11-15 NOTE — Progress Notes (Signed)
Pharmacy Antibiotic Note  [name] is a 83 y.o. male admitted on 11/12/2018 with cellulitis.  Pharmacy has been consulted for vancomycin and zosyn dosing - day #3. Cultures negative to date. SCr up a bit today, 1>1.3.  Plan: Adjust vancomycin to 1500mg  IV q24h (expected AUC 543, SCr 1.3) Zosyn 3.375g IV q8h (4h infusion) Monitor clinical progress, c/s, renal function F/u de-escalation plan/LOT, vancomycin levels as indicated    Height: 6\' 4"  (193 cm) IBW/kg (Calculated) : 86.8  Temp (24hrs), Avg:98.5 F (36.9 C), Min:97.9 F (36.6 C), Max:99.2 F (37.3 C)  Recent Labs  Lab 11/12/18 2044 11/12/18 2045 11/13/18 0049 11/13/18 0341 11/14/18 0804 11/15/18 0339  WBC  --  12.4*  --  12.4* 11.7* 11.0*  CREATININE  --  1.04  --  0.96 1.01 1.30*  LATICACIDVEN 1.4  --  1.4  --   --   --     CrCl cannot be calculated (Unknown ideal weight.).    Allergies  Allergen Reactions  . Sulfa Antibiotics Other (See Comments)    Didn't feel right    Elicia Lamp, PharmD, BCPS Please check AMION for all [facility] contact numbers Clinical Pharmacist 11/15/2018 1:10 PM

## 2018-11-16 ENCOUNTER — Inpatient Hospital Stay (HOSPITAL_COMMUNITY): Payer: Medicare Other

## 2018-11-16 LAB — COMPREHENSIVE METABOLIC PANEL
ALT: 64 U/L — ABNORMAL HIGH (ref 0–44)
AST: 58 U/L — ABNORMAL HIGH (ref 15–41)
Albumin: 2 g/dL — ABNORMAL LOW (ref 3.5–5.0)
Alkaline Phosphatase: 55 U/L (ref 38–126)
Anion gap: 9 (ref 5–15)
BUN: 33 mg/dL — ABNORMAL HIGH (ref 8–23)
CO2: 30 mmol/L (ref 22–32)
Calcium: 8.5 mg/dL — ABNORMAL LOW (ref 8.9–10.3)
Chloride: 102 mmol/L (ref 98–111)
Creatinine, Ser: 1.16 mg/dL (ref 0.61–1.24)
GFR calc Af Amer: >60 mL/min (ref >60)
GFR calc non Af Amer: 59 mL/min — ABNORMAL LOW (ref >60)
Glucose, Bld: 111 mg/dL — ABNORMAL HIGH (ref 70–99)
Potassium: 4.1 mmol/L (ref 3.5–5.1)
Sodium: 141 mmol/L (ref 135–145)
Total Bilirubin: 0.6 mg/dL (ref 0.3–1.2)
Total Protein: 6.6 g/dL (ref 6.5–8.1)

## 2018-11-16 LAB — CBC
HCT: 34.2 % — ABNORMAL LOW (ref 39.0–52.0)
Hemoglobin: 10.7 g/dL — ABNORMAL LOW (ref 13.0–17.0)
MCH: 30.1 pg (ref 26.0–34.0)
MCHC: 31.3 g/dL (ref 30.0–36.0)
MCV: 96.1 fL (ref 80.0–100.0)
Platelets: 409 10*3/uL — ABNORMAL HIGH (ref 150–400)
RBC: 3.56 MIL/uL — ABNORMAL LOW (ref 4.22–5.81)
RDW: 13.2 % (ref 11.5–15.5)
WBC: 18.5 10*3/uL — ABNORMAL HIGH (ref 4.0–10.5)
nRBC: 0 % (ref 0.0–0.2)

## 2018-11-16 MED ORDER — GUAIFENESIN ER 600 MG PO TB12
600.0000 mg | ORAL_TABLET | Freq: Two times a day (BID) | ORAL | Status: DC
  Administered 2018-11-16 – 2018-11-20 (×9): 600 mg via ORAL
  Filled 2018-11-16 (×9): qty 1

## 2018-11-16 MED ORDER — IPRATROPIUM-ALBUTEROL 0.5-2.5 (3) MG/3ML IN SOLN
3.0000 mL | Freq: Four times a day (QID) | RESPIRATORY_TRACT | Status: DC
  Administered 2018-11-16 – 2018-11-17 (×4): 3 mL via RESPIRATORY_TRACT
  Filled 2018-11-16 (×3): qty 3

## 2018-11-16 MED ORDER — METHYLPREDNISOLONE SODIUM SUCC 40 MG IJ SOLR
40.0000 mg | Freq: Two times a day (BID) | INTRAMUSCULAR | Status: DC
  Administered 2018-11-16 – 2018-11-20 (×8): 40 mg via INTRAVENOUS
  Filled 2018-11-16 (×8): qty 1

## 2018-11-16 MED ORDER — CEFAZOLIN SODIUM-DEXTROSE 2-4 GM/100ML-% IV SOLN
2.0000 g | Freq: Three times a day (TID) | INTRAVENOUS | Status: DC
  Administered 2018-11-16 – 2018-11-20 (×12): 2 g via INTRAVENOUS
  Filled 2018-11-16 (×12): qty 100

## 2018-11-16 NOTE — Progress Notes (Signed)
Physical Therapy Treatment Patient Details Name: [name] MRN: [id number] DOB: Sep 18, 1935 Today's Date: 11/16/2018    History of Present Illness [name] is a 83 y.o. male with medical history significant of CAD, colon Ca, BLE lymph edema and venous stasis ulcers.Patient was hospitalized at [facility] x2 days ago for 1 day after worsening wounds to leg.  Sent home with script for bactrim for UTI which he didn't fill due to sulfa allergy.After going home patient has been unable to stand due to weakness in his legs and unable to walk for the past one day or so.  Has chronic low back pain which is unchanged.    PT Comments    Pt was seen for mobility and strength training, and noted postural correction in standing is difficulty due to his tightness of hip flexion contractures.  Pt was able to side step but due to O2 sats dropping to 73% and then slowly recovering to 93%, he was not able to walk further.  Follow up with him to work on sitting out of the bed in a chair, and to continue on with gait efforts as his O2 sats permit.  Talked with pt about his breathing techniques, and he is able to demonstrate pursed lip breathing.  He is however possibly holding his breath with the exertion of gait on the RW at the side of the bed.  Work on Office manager with all therapy sessions.   Follow Up Recommendations  CIR     Equipment Recommendations  None recommended by PT    Recommendations for Other Services Rehab consult     Precautions / Restrictions Precautions Precautions: Fall Precaution Comments: monitor drainage from compression garments Restrictions Weight Bearing Restrictions: No    Mobility  Bed Mobility Overal bed mobility: Needs Assistance Bed Mobility: Supine to Sit;Sit to Supine     Supine to sit: Max assist Sit to supine: Max assist      Transfers Overall transfer level: Needs assistance Equipment used: Rolling walker (2 wheeled);1 person hand held assist Transfers:  Sit to/from Stand Sit to Stand: Max assist;From elevated surface(stood on second attempt, better than last visit)            Ambulation/Gait Ambulation/Gait assistance: Min assist Gait Distance (Feet): 6 Feet Assistive device: Rolling walker (2 wheeled);1 person hand held assist Gait Pattern/deviations: Step-to pattern;Wide base of support;Trunk flexed;Decreased weight shift to left Gait velocity: reduced   General Gait Details: sidesteps on side of bed, O2 sat dropped to 73% so did not perform more.  Discussed PT suspicion that pt is holding his breath   Stairs             Wheelchair Mobility    Modified Rankin (Stroke Patients Only)       Balance Overall balance assessment: History of Falls;Needs assistance Sitting-balance support: Feet supported;Bilateral upper extremity supported Sitting balance-Leahy Scale: Fair     Standing balance support: Bilateral upper extremity supported;During functional activity Standing balance-Leahy Scale: Poor Standing balance comment: poor but with cues can get more upright                            Cognition Arousal/Alertness: Awake/alert Behavior During Therapy: WFL for tasks assessed/performed Overall Cognitive Status: Within Functional Limits for tasks assessed  General Comments: pt is still discussing his abilty to stand      Exercises      General Comments General comments (skin integrity, edema, etc.): Pt is standing with heavy help, and once up can gradually stretch to stand more upright but ROM at hips continues to hinder his fully extended posture.      Pertinent Vitals/Pain Pain Assessment: Faces Faces Pain Scale: Hurts even more Pain Location: B lower legs Pain Descriptors / Indicators: Grimacing    Home Living                      Prior Function            PT Goals (current goals can now be found in the care plan section) Acute Rehab  PT Goals Patient Stated Goal: to get stronger and get home Progress towards PT goals: Progressing toward goals    Frequency    Min 3X/week      PT Plan Current plan remains appropriate    Co-evaluation              AM-PAC PT "6 Clicks" Mobility   Outcome Measure  Help needed turning from your back to your side while in a flat bed without using bedrails?: A Lot Help needed moving from lying on your back to sitting on the side of a flat bed without using bedrails?: A Lot Help needed moving to and from a bed to a chair (including a wheelchair)?: A Lot Help needed standing up from a chair using your arms (e.g., wheelchair or bedside chair)?: A Lot Help needed to walk in hospital room?: A Little Help needed climbing 3-5 steps with a railing? : Total 6 Click Score: 12    End of Session Equipment Utilized During Treatment: Gait belt Activity Tolerance: Patient tolerated treatment well;Patient limited by fatigue;Treatment limited secondary to medical complications (Comment) Patient left: in bed;with call bell/phone within reach;with bed alarm set;with family/visitor present Nurse Communication: Mobility status PT Visit Diagnosis: Unsteadiness on feet (R26.81);Muscle weakness (generalized) (M62.81);Difficulty in walking, not elsewhere classified (R26.2)     Time: [id number] PT Time Calculation (min) (ACUTE ONLY): 31 min  Charges:  $Gait Training: 8-22 mins $Therapeutic Activity: 8-22 mins                  Ramond Dial 11/16/2018, 1:02 PM   Mee Hives, PT MS Acute Rehab Dept. Number: [place] and [place]

## 2018-11-16 NOTE — Progress Notes (Addendum)
PROGRESS NOTE    [name]  [id number] DOB: 1935/03/18 DOA: 11/12/2018 PCP: Renne Crigler, NP   Brief Narrative: Patient is 83 year old male with history of coronary disease, colon cancer, bilateral lower extremity lymphedema, venous stasis ulcer who was admitted here for the management of bilateral lower extremity cellulitis.  He was started on broad-spectrum antibiotics.  Orthopedics was following and recommended compression stockings.  PT/OT evaluated him and recommended CIR.  Assessment & Plan:   Principal Problem:   Bilateral leg weakness Active Problems:   HTN (hypertension)   Venous stasis ulcer (HCC)   Weakness of both lower extremities   Abnormal MRI   Bacteria in urine   Transaminitis   Bilateral lower extremity cellulitis: Has history of venous insufficiency, lymphedema of bilateral lower extremities.  Presented with cellulitis along with petechiae, purpuric blisters.  This is been ongoing for last 5 years.  Did not respond to oral antibiotics that was started as an outpatient.  Also has leukocytosis.  Cultures negative so far.  He was started on Zosyn.  Orthopedics was consulted who recommended compression stockings.  We recommend to elevate the lower extremities.  Maintain good oral intake from nutritional point of view. Cellulitis looks better today.  His legs were wrapped with compression socks.  I will change antibiotics to cefazolin.  Acute respiratory with hypoxia: New problem.  He was found to have bilateral expiratory wheezes today.  He desaturated on ambulation.  I have discontinued IV fluids.  I will get chest x-ray.  He was a previous smoker.  He might have underlying COPD.  Started on steroids continue supplemental oxygen for now.I will get chest Xray.  Abnormal MRI/bilateral lower extremity weakness: MRI showed subacute L4 fracture, neural foraminal stenosis, large osteophyte at L5-S1.  Patient does not complain of any back pain today.  He was seen by  physical therapy and recommended CIR  Asymptomatic bacteriuria: Denies any dysuria.  Urine culture did not show any significant growth  Hypertension: Blood pressure currently stable.  ACE inhibitor and diuretics on hold due to bump in creatinine.  Continue Norvasc and Toprol.  Transaminitis: LFT trended down.  Continue to monitor.  Patient currently asymptomatic.  Elevated liver enzymes most likely associated with elevated CK level.  Elevated CK: Started on IV fluids which has been stopped because patient developed wheezing.CK has improved.   Nutrition Problem: Increased nutrient needs Etiology: wound healing      DVT prophylaxis: Lovenox Code Status: Full Family Communication: Wife at bedside Disposition Plan: CIR   Consultants: Ortho  Procedures:None  Antimicrobials:  Anti-infectives (From admission, onward)   Start     Dose/Rate Route Frequency Ordered Stop   11/16/18 0300  vancomycin (VANCOCIN) 1,500 mg in sodium chloride 0.9 % 500 mL IVPB  Status:  Discontinued     1,500 mg 250 mL/hr over 120 Minutes Intravenous Every 24 hours 11/15/18 1305 11/15/18 1312   11/14/18 0300  vancomycin (VANCOCIN) 2,500 mg in sodium chloride 0.9 % 500 mL IVPB  Status:  Discontinued     2,500 mg 250 mL/hr over 120 Minutes Intravenous Every 24 hours 11/13/18 0223 11/15/18 1305   11/13/18 1000  piperacillin-tazobactam (ZOSYN) IVPB 3.375 g  Status:  Discontinued     3.375 g 12.5 mL/hr over 240 Minutes Intravenous Every 8 hours 11/13/18 0223 11/16/18 1310   11/13/18 0200  vancomycin (VANCOCIN) IVPB 1000 mg/200 mL premix  Status:  Discontinued     1,000 mg 200 mL/hr over 60 Minutes Intravenous  Once  11/13/18 0154 11/13/18 0156   11/13/18 0200  piperacillin-tazobactam (ZOSYN) IVPB 3.375 g     3.375 g 100 mL/hr over 30 Minutes Intravenous  Once 11/13/18 0154 11/13/18 0237   11/13/18 0200  vancomycin (VANCOCIN) 2,000 mg in sodium chloride 0.9 % 500 mL IVPB     2,000 mg 250 mL/hr over 120 Minutes  Intravenous  Once 11/13/18 0156 11/13/18 0657      Subjective: Patient seen and examined the bedside this afternoon.  Hemodynamically stable.  He was seen by physical therapy earlier.  He desaturated on ambulation.  Currently requiring supplemental oxygen.  Also has bilateral expiratory wheezes.  Objective: Vitals:   11/16/18 0352 11/16/18 0643 11/16/18 0735 11/16/18 0800  BP: 128/67  123/64   Pulse: 77  74 82  Resp:   18 (!) 23  Temp: 98.4 F (36.9 C)  98.3 F (36.8 C)   TempSrc: Oral  Oral   SpO2: 97%  95% 93%  Weight:  112 kg    Height:        Intake/Output Summary (Last 24 hours) at 11/16/2018 1322 Last data filed at 11/16/2018 1000 Gross per 24 hour  Intake 1111.87 ml  Output 1950 ml  Net -838.13 ml   Filed Weights   11/16/18 0643  Weight: 112 kg    Examination:  General exam: Pleasant elderly male, deconditioned/debilitated  HEENT:PERRL,Oral mucosa moist, Ear/Nose normal on gross exam Respiratory system: Bilateral expiratory wheezes Cardiovascular system: S1 & S2 heard, RRR. No JVD, murmurs, rubs, gallops or clicks. No pedal edema. Gastrointestinal system: Abdomen is nondistended, soft and nontender. No organomegaly or masses felt. Normal bowel sounds heard. Central nervous system: Alert and oriented. No focal neurological deficits. Extremities: Bilateral lower extremity wrapped with compression socks     Data Reviewed: I have personally reviewed following labs and imaging studies  CBC: Recent Labs  Lab 11/12/18 2045 11/13/18 0341 11/14/18 0804 11/15/18 0339 11/16/18 0349  WBC 12.4* 12.4* 11.7* 11.0* 18.5*  NEUTROABS 10.2*  --  9.5* 9.8*  --   HGB 11.9* 11.4* 10.8* 10.7* 10.7*  HCT 37.4* 35.2* 33.2* 33.7* 34.2*  MCV 93.3 91.9 92.0 93.9 96.1  PLT 439* 410* 401* 404* [id number]*   Basic Metabolic Panel: Recent Labs  Lab 11/12/18 2045 11/13/18 0341 11/14/18 0804 11/15/18 0339 11/16/18 0349  NA 134* 139 140 140 141  K 3.6 3.7 4.1 4.0 4.1  CL 98 104  105 99 102  CO2 26 24 27 30 30   GLUCOSE 144* 118* 106* 162* 111*  BUN 26* 28* 23 32* 33*  CREATININE 1.04 0.96 1.01 1.30* 1.16  CALCIUM 8.7* 8.6* 8.3* 8.3* 8.5*  MG  --   --  1.8  --   --    GFR: Estimated Creatinine Clearance: 68.5 mL/min (by C-G formula based on SCr of 1.16 mg/dL). Liver Function Tests: Recent Labs  Lab 11/12/18 2045 11/13/18 0341 11/15/18 0339 11/16/18 0349  AST 218* 186* 91* 58*  ALT 84* 81* 73* 64*  ALKPHOS 72 69 57 55  BILITOT 0.6 0.8 0.3 0.6  PROT 7.2 6.9 6.2* 6.6  ALBUMIN 2.5* 2.4* 2.1* 2.0*   No results for input(s): LIPASE, AMYLASE in the last 168 hours. No results for input(s): AMMONIA in the last 168 hours. Coagulation Profile: No results for input(s): INR, PROTIME in the last 168 hours. Cardiac Enzymes: Recent Labs  Lab 11/13/18 0341 11/14/18 0804  CKTOTAL 3,156* 1,149*   BNP (last 3 results) No results for input(s): PROBNP in the last  8760 hours. HbA1C: No results for input(s): HGBA1C in the last 72 hours. CBG: No results for input(s): GLUCAP in the last 168 hours. Lipid Profile: No results for input(s): CHOL, HDL, LDLCALC, TRIG, CHOLHDL, LDLDIRECT in the last 72 hours. Thyroid Function Tests: No results for input(s): TSH, T4TOTAL, FREET4, T3FREE, THYROIDAB in the last 72 hours. Anemia Panel: No results for input(s): VITAMINB12, FOLATE, FERRITIN, TIBC, IRON, RETICCTPCT in the last 72 hours. Sepsis Labs: Recent Labs  Lab 11/12/18 2044 11/13/18 0049  LATICACIDVEN 1.4 1.4    Recent Results (from the past 240 hour(s))  Blood culture (routine x 2)     Status: None (Preliminary result)   Collection Time: 11/13/18 12:30 AM   Specimen: BLOOD RIGHT HAND  Result Value Ref Range Status   Specimen Description BLOOD RIGHT HAND  Final   Special Requests   Final    BOTTLES DRAWN AEROBIC AND ANAEROBIC Blood Culture results may not be optimal due to an inadequate volume of blood received in culture bottles   Culture   Final    NO GROWTH 3  DAYS Performed at [facility], [place] [address], [place], [place] [address]    Report Status PENDING  Incomplete  Blood culture (routine x 2)     Status: None (Preliminary result)   Collection Time: 11/13/18 12:50 AM   Specimen: BLOOD RIGHT WRIST  Result Value Ref Range Status   Specimen Description BLOOD RIGHT WRIST  Final   Special Requests   Final    BOTTLES DRAWN AEROBIC AND ANAEROBIC Blood Culture adequate volume   Culture   Final    NO GROWTH 3 DAYS Performed at [facility], [place] [address], [place], [place] [address]    Report Status PENDING  Incomplete  Urine Culture     Status: Abnormal   Collection Time: 11/13/18 12:50 AM   Specimen: Urine, Random  Result Value Ref Range Status   Specimen Description URINE, RANDOM  Final   Special Requests NONE  Final   Culture (A)  Final    <10,000 COLONIES/mL INSIGNIFICANT GROWTH Performed at [facility], [place] [address] [place], [place] [address]    Report Status 11/14/2018 FINAL  Final  SARS CORONAVIRUS 2 (TAT 6-24 HRS) Nasopharyngeal Nasopharyngeal Swab     Status: None   Collection Time: 11/13/18  2:28 AM   Specimen: Nasopharyngeal Swab  Result Value Ref Range Status   SARS Coronavirus 2 NEGATIVE NEGATIVE Final    Comment: (NOTE) SARS-CoV-2 target nucleic acids are NOT DETECTED. The SARS-CoV-2 RNA is generally detectable in upper and lower respiratory specimens during the acute phase of infection. Negative results do not preclude SARS-CoV-2 infection, do not rule out co-infections with other pathogens, and should not be used as the sole basis for treatment or other patient management decisions. Negative results must be combined with clinical observations, patient history, and epidemiological information. The expected result is Negative. Fact Sheet for Patients: SugarRoll.be Fact Sheet for Healthcare Providers: https://www.woods-mathews.com/ This test is not yet  approved or cleared by the [place] FDA and  has been authorized for detection and/or diagnosis of SARS-CoV-2 by FDA under an Emergency Use Authorization (EUA). This EUA will remain  in effect (meaning this test can be used) for the duration of the COVID-19 declaration under Section 56 4(b)(1) of the Act, 21 U.S.C. section 360bbb-3(b)(1), unless the authorization is terminated or revoked sooner. Performed at [facility], [place] [address] [place], [place] [address]  Radiology Studies: No results found.      Scheduled Meds: . amLODipine  10 mg Oral QHS  . aspirin  81 mg Oral QPM  . atorvastatin  20 mg Oral QPM  . cholecalciferol  1,000 Units Oral BID  . enoxaparin (LOVENOX) injection  40 mg Subcutaneous Q24H  . feeding supplement (ENSURE ENLIVE)  237 mL Oral BID BM  . feeding supplement (PRO-STAT SUGAR FREE 64)  30 mL Oral BID  . ferrous gluconate  324 mg Oral Daily  . guaiFENesin  600 mg Oral BID  . ipratropium-albuterol  3 mL Nebulization Q6H  . methylPREDNISolone (SOLU-MEDROL) injection  40 mg Intravenous Q12H  . metoprolol succinate  100 mg Oral QHS  . sodium chloride flush  3 mL Intravenous Once   Continuous Infusions:   LOS: 2 days    Time spent: 35 mins.More than 50% of that time was spent in counseling and/or coordination of care.      Shelly Coss, MD [facility] Pager [phone]  If 7PM-7AM, please contact night-coverage [email or web address] Password [email or web address] 11/16/2018, 1:22 PM

## 2018-11-16 NOTE — Progress Notes (Signed)
Pharmacy Antibiotic Note  [name] is a 83 y.o. male admitted on 11/12/2018 with cellulitis.  Pharmacy has been consulted to transition from Zosyn to cefazolin dosing - antibiotic day #4. Cultures negative to date. SCr back down to 1.16.  Plan: De-escalate to cefazolin 2g IV q8h Monitor clinical progress, c/s, renal function F/u LOT  Height: 6\' 4"  (193 cm) Weight: 246 lb 14.6 oz (112 kg) IBW/kg (Calculated) : 86.8  Temp (24hrs), Avg:98.2 F (36.8 C), Min:97.8 F (36.6 C), Max:98.6 F (37 C)  Recent Labs  Lab 11/12/18 2044 11/12/18 2045 11/13/18 0049 11/13/18 0341 11/14/18 0804 11/15/18 0339 11/16/18 0349  WBC  --  12.4*  --  12.4* 11.7* 11.0* 18.5*  CREATININE  --  1.04  --  0.96 1.01 1.30* 1.16  LATICACIDVEN 1.4  --  1.4  --   --   --   --     Estimated Creatinine Clearance: 68.5 mL/min (by C-G formula based on SCr of 1.16 mg/dL).    Allergies  Allergen Reactions  . Sulfa Antibiotics Other (See Comments)    Didn't feel right    Elicia Lamp, PharmD, BCPS Please check AMION for all [place] contact numbers Clinical Pharmacist 11/16/2018 1:21 PM

## 2018-11-16 NOTE — Plan of Care (Signed)
  Problem: Education: Goal: Knowledge of General Education information will improve Description: Including pain rating scale, medication(s)/side effects and non-pharmacologic comfort measures Outcome: Progressing   Problem: Health Behavior/Discharge Planning: Goal: Ability to manage health-related needs will improve Outcome: Progressing   Problem: Clinical Measurements: Goal: Ability to maintain clinical measurements within normal limits will improve Outcome: Progressing Goal: Will remain free from infection Outcome: Progressing Goal: Diagnostic test results will improve Outcome: Progressing Goal: Respiratory complications will improve Outcome: Progressing Goal: Cardiovascular complication will be avoided Outcome: Progressing   Problem: Safety: Goal: Ability to remain free from injury will improve Outcome: Progressing   Problem: Skin Integrity: Goal: Risk for impaired skin integrity will decrease Outcome: Progressing   Problem: Education: Goal: Knowledge of General Education information will improve Description: Including pain rating scale, medication(s)/side effects and non-pharmacologic comfort measures Outcome: Progressing   Problem: Health Behavior/Discharge Planning: Goal: Ability to manage health-related needs will improve Outcome: Progressing

## 2018-11-17 LAB — CBC WITH DIFFERENTIAL/PLATELET
Abs Immature Granulocytes: 0.07 10*3/uL (ref 0.00–0.07)
Basophils Absolute: 0 10*3/uL (ref 0.0–0.1)
Basophils Relative: 0 %
Eosinophils Absolute: 0 10*3/uL (ref 0.0–0.5)
Eosinophils Relative: 0 %
HCT: 32.9 % — ABNORMAL LOW (ref 39.0–52.0)
Hemoglobin: 10.4 g/dL — ABNORMAL LOW (ref 13.0–17.0)
Immature Granulocytes: 1 %
Lymphocytes Relative: 2 %
Lymphs Abs: 0.3 10*3/uL — ABNORMAL LOW (ref 0.7–4.0)
MCH: 29.8 pg (ref 26.0–34.0)
MCHC: 31.6 g/dL (ref 30.0–36.0)
MCV: 94.3 fL (ref 80.0–100.0)
Monocytes Absolute: 0.2 10*3/uL (ref 0.1–1.0)
Monocytes Relative: 1 %
Neutro Abs: 12.6 10*3/uL — ABNORMAL HIGH (ref 1.7–7.7)
Neutrophils Relative %: 96 %
Platelets: 412 10*3/uL — ABNORMAL HIGH (ref 150–400)
RBC: 3.49 MIL/uL — ABNORMAL LOW (ref 4.22–5.81)
RDW: 13.2 % (ref 11.5–15.5)
WBC: 13.1 10*3/uL — ABNORMAL HIGH (ref 4.0–10.5)
nRBC: 0 % (ref 0.0–0.2)

## 2018-11-17 LAB — SARS CORONAVIRUS 2 (TAT 6-24 HRS): SARS Coronavirus 2: NEGATIVE

## 2018-11-17 MED ORDER — FUROSEMIDE 10 MG/ML IJ SOLN
40.0000 mg | Freq: Two times a day (BID) | INTRAMUSCULAR | Status: AC
  Administered 2018-11-17 (×2): 40 mg via INTRAVENOUS
  Filled 2018-11-17 (×2): qty 4

## 2018-11-17 MED ORDER — IPRATROPIUM-ALBUTEROL 0.5-2.5 (3) MG/3ML IN SOLN: 3.0000 mL | Freq: Four times a day (QID) | RESPIRATORY_TRACT | Status: DC | PRN

## 2018-11-17 NOTE — Plan of Care (Signed)
  Problem: Education: Goal: Knowledge of General Education information will improve Description: Including pain rating scale, medication(s)/side effects and non-pharmacologic comfort measures Outcome: Progressing   Problem: Health Behavior/Discharge Planning: Goal: Ability to manage health-related needs will improve Outcome: Progressing   Problem: Clinical Measurements: Goal: Ability to maintain clinical measurements within normal limits will improve Outcome: Progressing Goal: Will remain free from infection Outcome: Progressing Goal: Diagnostic test results will improve Outcome: Progressing Goal: Respiratory complications will improve Outcome: Progressing Goal: Cardiovascular complication will be avoided Outcome: Progressing   Problem: Nutrition: Goal: Adequate nutrition will be maintained Outcome: Progressing   Problem: Coping: Goal: Level of anxiety will decrease Outcome: Progressing   Problem: Elimination: Goal: Will not experience complications related to bowel motility Outcome: Progressing Goal: Will not experience complications related to urinary retention Outcome: Progressing   Problem: Skin Integrity: Goal: Risk for impaired skin integrity will decrease Outcome: Progressing   Problem: Education: Goal: Knowledge of General Education information will improve Description: Including pain rating scale, medication(s)/side effects and non-pharmacologic comfort measures Outcome: Progressing   Problem: Health Behavior/Discharge Planning: Goal: Ability to manage health-related needs will improve Outcome: Progressing

## 2018-11-17 NOTE — Care Management Important Message (Signed)
Important Message  Patient Details  Name: [name] MRN: [id number] Date of Birth: Oct 04, 1935   Medicare Important Message Given:  Yes     Latavious Bitter 11/17/2018, 3:11 PM

## 2018-11-17 NOTE — TOC Progression Note (Signed)
Transition of Care [facility]) - Progression Note    Patient Details  Name: [name] MRN: [id number] Date of Birth: 17-Jan-1936  Transition of Care [facility] CM/SW [place], [place] Phone Number: 11/17/2018, 2:28 PM  Clinical Narrative:   CSW alerted that patient has been officially declined for CIR, will be for SNF placement. CSW asked MD to order covid test for possible admit to SNF tomorrow. CSW completed referral and sent to [facility], called Admissions to ask them to review. CSW to follow.    Expected Discharge Plan: IP Rehab Facility Barriers to Discharge: Continued Medical Work up  Expected Discharge Plan and Services Expected Discharge Plan: [place]     Post Acute Care Choice: IP Rehab Living arrangements for the past 2 months: Single Family Home                                       Social Determinants of Health (SDOH) Interventions    Readmission Risk Interventions No flowsheet data found.

## 2018-11-17 NOTE — NC FL2 (Signed)
[place] MEDICAID FL2 LEVEL OF CARE SCREENING TOOL     IDENTIFICATION  Patient Name: SANDIP GOTTSCHALL Birthdate: [birth year] Sex: male Admission Date (Current Location): 11/12/2018  [facility] and [place] Number:  [employer] and Address:  [facility], [place] [address], [place],  [address]      Provider Number: [id number]  Attending Physician Name and Address:  Shelly Coss, MD  Relative Name and Phone Number:       Current Level of Care: Hospital Recommended Level of Care: [place] Prior Approval Number:    Date Approved/Denied:   PASRR Number: [id number]  Discharge Plan: SNF    Current Diagnoses: Patient Active Problem List   Diagnosis Date Noted  . Weakness of both lower extremities   . Abnormal MRI   . Bacteria in urine   . Transaminitis   . Bilateral leg weakness 11/13/2018  . Venous stasis ulcer ([place]) 11/13/2018  . Petechial rash   . 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 Wounds, multiple   . Severe protein-calorie malnutrition ([place])   . Abnormal EKG 04/20/2018  . Abdominal aortic aneurysm (AAA) without rupture ([place]) 04/20/2018  . Dyslipidemia 03/24/2017  . HTN (hypertension) 06/02/2010  . Arthritis 06/02/2010  . CAD (coronary artery disease) 06/02/2010  . Colon cancer ([place]) 06/02/2010  . Pseudo-gout 06/02/2010  . DYSLIPIDEMIA 12/12/2008  . HYPERTENSION, UNSPECIFIED 04/30/2008  . CAD, UNSPECIFIED SITE 04/30/2008    Orientation RESPIRATION BLADDER Height & Weight     Self, Time, Situation, Place  O2( 5L) Incontinent, Continent(incontinent at times) Weight: 246 lb 14.6 oz (112 kg) Height:  6\' 4"  (193 cm)  BEHAVIORAL SYMPTOMS/MOOD NEUROLOGICAL BOWEL NUTRITION STATUS      Continent, Incontinent(incontinent at times) Diet(heart healthy)  AMBULATORY STATUS COMMUNICATION OF NEEDS Skin   Extensive Assist Verbally Other (Comment)(cellulitis, lower extremeties)                        Personal Care Assistance Level of Assistance  Bathing, Feeding, Dressing Bathing Assistance: Maximum assistance Feeding assistance: Limited assistance Dressing Assistance: Maximum assistance     Functional Limitations Info  Sight, Hearing Sight Info: Adequate(glasses) Hearing Info: Adequate(hard of hearing)      SPECIAL CARE FACTORS FREQUENCY  PT (By licensed PT), OT (By licensed OT)     PT Frequency: 5x/wk OT Frequency: 5x/wk            Contractures Contractures Info: Not present    Additional Factors Info  Code Status, Allergies Code Status Info: Full Allergies Info: Sulfa Antibiotics           Current Medications (11/17/2018):  This is the current hospital active medication list Current Facility-Administered Medications  Medication Dose Route Frequency Provider Last Rate Last Dose  . acetaminophen (TYLENOL) tablet 650 mg  650 mg Oral Q6H PRN Etta Quill, DO   650 mg at 11/17/18 [id number]   Or  . acetaminophen (TYLENOL) suppository 650 mg  650 mg Rectal Q6H PRN Etta Quill, DO      . amLODipine (NORVASC) tablet 10 mg  10 mg Oral QHS Eugenie Filler, MD   10 mg at 11/16/18 2114  . aspirin chewable tablet 81 mg  81 mg Oral QPM Jennette Kettle M, DO   81 mg at 11/16/18 1600  . atorvastatin (LIPITOR) tablet 20 mg  20 mg Oral QPM Jennette Kettle M, DO   20 mg at 11/16/18 1600  .  ceFAZolin (ANCEF) IVPB 2g/100 mL premix  2 g Intravenous Q8H Romona Curls, RPH 200 mL/hr at 11/17/18 0753 2 g at 11/17/18 0753  . cholecalciferol (VITAMIN D3) tablet 1,000 Units  1,000 Units Oral BID Etta Quill, DO   1,000 Units at 11/17/18 0755  . enoxaparin (LOVENOX) injection 40 mg  40 mg Subcutaneous Q24H Jennette Kettle M, DO   40 mg at 11/17/18 1344  . feeding supplement (ENSURE ENLIVE) (ENSURE ENLIVE) liquid 237 mL  237 mL Oral BID BM Eugenie Filler, MD   237 mL at 11/17/18 1344  . feeding supplement (PRO-STAT SUGAR FREE 64) liquid 30 mL  30 mL Oral BID Newt Minion,  MD   30 mL at 11/17/18 0757  . ferrous gluconate (FERGON) tablet 324 mg  324 mg Oral Daily Etta Quill, DO   324 mg at 11/17/18 [id number]  . furosemide (LASIX) injection 40 mg  40 mg Intravenous Q12H Shelly Coss, MD   40 mg at 11/17/18 0926  . guaiFENesin (MUCINEX) 12 hr tablet 600 mg  600 mg Oral BID Shelly Coss, MD   600 mg at 11/17/18 0754  . ipratropium-albuterol (DUONEB) 0.5-2.5 (3) MG/3ML nebulizer solution 3 mL  3 mL Nebulization Q6H Adhikari, Amrit, MD   3 mL at 11/17/18 0910  . methylPREDNISolone sodium succinate (SOLU-MEDROL) 40 mg/mL injection 40 mg  40 mg Intravenous Q12H Shelly Coss, MD   40 mg at 11/17/18 1344  . metoprolol succinate (TOPROL-XL) 24 hr tablet 100 mg  100 mg Oral QHS Jennette Kettle M, DO   100 mg at 11/16/18 2113  . ondansetron (ZOFRAN) tablet 4 mg  4 mg Oral Q6H PRN Etta Quill, DO   4 mg at 11/14/18 2111   Or  . ondansetron [facility]) injection 4 mg  4 mg Intravenous Q6H PRN Etta Quill, DO      . sodium chloride flush (NS) 0.9 % injection 3 mL  3 mL Intravenous Once Rancour, Annie Main, MD         Discharge Medications: Please see discharge summary for a list of discharge medications.  Relevant Imaging Results:  Relevant Lab Results:   Additional Information SS#: [id number]  Geralynn Ochs, [place]

## 2018-11-17 NOTE — Progress Notes (Signed)
[name]  [id number] DOB: [birth year] DOA: 11/12/2018 PCP: Renne Crigler, NP   Brief Narrative: Patient is 83 year old male with history of coronary disease, colon cancer, bilateral lower extremity lymphedema, venous stasis ulcer who was admitted here for the management of bilateral lower extremity cellulitis.  He was started on broad-spectrum antibiotics.  Orthopedics was following and recommended compression stockings.  PT/OT evaluated him and recommended CIR.Cir recommending SNF now.SW consulted.  Assessment & Plan:   Principal Problem:   Bilateral leg weakness Active Problems:   HTN (hypertension)   Venous stasis ulcer (HCC)   Weakness of both lower extremities   Abnormal MRI   Bacteria in urine   Transaminitis   Bilateral lower extremity cellulitis: Has history of venous insufficiency, lymphedema of bilateral lower extremities.  Presented with cellulitis along with petechiae, purpuric blisters.  This is been ongoing for last 5 years.  Did not respond to oral antibiotics that was started as an outpatient.  Also has leukocytosis.  Cultures negative so far.  He was started on Zosyn.  Orthopedics was consulted who recommended compression stockings.  We recommend to elevate the lower extremities.  Maintain good oral intake from nutritional point of view. Cellulitis looks better today.  His legs were wrapped with compression socks. Changed antibiotics to cefazolin.We will continue IV antibiotics till discharge.  Acute respiratory with hypoxia: New problem.  He was found to have bilateral expiratory wheezes .  He desaturated on ambulation.  I have discontinued IV fluids. He was a previous smoker.  He might have underlying COPD.  Started on steroids continue supplemental oxygen for now.Chest Xray showed mild vascular congestion/interstitial edema.  He will be given 2 doses of Lasix.  Abnormal MRI/bilateral lower extremity weakness: MRI showed subacute L4 fracture,  neural foraminal stenosis, large osteophyte at L5-S1.  Patient does not complain of any back pain today.  He was seen by physical therapy and recommended CIR/SNF  Asymptomatic bacteriuria: Denies any dysuria.  Urine culture did not show any significant growth  Hypertension: Blood pressure currently stable.  ACE inhibitor and diuretics on hold due to bump in creatinine.  Continue Norvasc and Toprol.  Transaminitis: LFT trended down.  Continue to monitor.  Patient currently asymptomatic.  Elevated liver enzymes most likely associated with elevated CK level.  Elevated CK: Started on IV fluids which has been stopped because patient developed wheezing.CK has improved.   Nutrition Problem: Increased nutrient needs Etiology: wound healing      DVT prophylaxis: Lovenox Code Status: Full Family Communication: Wife at bedside on 11/16/18 Disposition Plan: SNF.He will be medically ready most likely tomorrow.   Consultants: Ortho  Procedures:None  Antimicrobials:  Anti-infectives (From admission, onward)   Start     Dose/Rate Route Frequency Ordered Stop   11/16/18 1600  ceFAZolin (ANCEF) IVPB 2g/100 mL premix     2 g 200 mL/hr over 30 Minutes Intravenous Every 8 hours 11/16/18 1323     11/16/18 0300  vancomycin (VANCOCIN) 1,500 mg in sodium chloride 0.9 % 500 mL IVPB  Status:  Discontinued     1,500 mg 250 mL/hr over 120 Minutes Intravenous Every 24 hours 11/15/18 1305 11/15/18 1312   11/14/18 0300  vancomycin (VANCOCIN) 2,500 mg in sodium chloride 0.9 % 500 mL IVPB  Status:  Discontinued     2,500 mg 250 mL/hr over 120 Minutes Intravenous Every 24 hours 11/13/18 0223 11/15/18 1305   11/13/18 1000  piperacillin-tazobactam (ZOSYN) IVPB 3.375 g  Status:  Discontinued     3.375 g 12.5 mL/hr over 240 Minutes Intravenous Every 8 hours 11/13/18 0223 11/16/18 1310   11/13/18 0200  vancomycin (VANCOCIN) IVPB 1000 mg/200 mL premix  Status:  Discontinued     1,000 mg 200 mL/hr over 60 Minutes  Intravenous  Once 11/13/18 0154 11/13/18 0156   11/13/18 0200  piperacillin-tazobactam (ZOSYN) IVPB 3.375 g     3.375 g 100 mL/hr over 30 Minutes Intravenous  Once 11/13/18 0154 11/13/18 0237   11/13/18 0200  vancomycin (VANCOCIN) 2,000 mg in sodium chloride 0.9 % 500 mL IVPB     2,000 mg 250 mL/hr over 120 Minutes Intravenous  Once 11/13/18 0156 11/13/18 0657      Subjective: Patient seen and examined at bedside this morning.  Hemodynamically stable.  Feels more comfortable today.  Still has bilateral expiratory wheezes on auscultation.  Objective: Vitals:   11/17/18 1141 11/17/18 1142 11/17/18 1200 11/17/18 1226  BP:   120/61   Pulse: 63 61 73 69  Resp: 17 12 17 12   Temp:      TempSrc:      SpO2: 95% 95% 94% 94%  Weight:      Height:        Intake/Output Summary (Last 24 hours) at 11/17/2018 1359 Last data filed at 11/17/2018 1100 Gross per 24 hour  Intake 200 ml  Output 2200 ml  Net -2000 ml   Filed Weights   11/16/18 0643  Weight: 112 kg    Examination:  General exam: Pleasant elderly male, deconditioned/debilitated  HEENT:PERRL,Oral mucosa moist, Ear/Nose normal on gross exam Respiratory system: Bilateral expiratory wheezes Cardiovascular system: S1 & S2 heard, RRR. No JVD, murmurs, rubs, gallops or clicks. No pedal edema. Gastrointestinal system: Abdomen is nondistended, soft and nontender. No organomegaly or masses felt. Normal bowel sounds heard. Central nervous system: Alert and oriented. No focal neurological deficits. Extremities: Bilateral lower extremity wrapped with compression socks     Data Reviewed: I have personally reviewed following labs and imaging studies  CBC: Recent Labs  Lab 11/12/18 2045 11/13/18 0341 11/14/18 0804 11/15/18 0339 11/16/18 0349 11/17/18 0346  WBC 12.4* 12.4* 11.7* 11.0* 18.5* 13.1*  NEUTROABS 10.2*  --  9.5* 9.8*  --  12.6*  HGB 11.9* 11.4* 10.8* 10.7* 10.7* 10.4*  HCT 37.4* 35.2* 33.2* 33.7* 34.2* 32.9*  MCV  93.3 91.9 92.0 93.9 96.1 94.3  PLT 439* 410* 401* 404* 409* 123456*   Basic Metabolic Panel: Recent Labs  Lab 11/12/18 2045 11/13/18 0341 11/14/18 0804 11/15/18 0339 11/16/18 0349  NA 134* 139 140 140 141  K 3.6 3.7 4.1 4.0 4.1  CL 98 104 105 99 102  CO2 26 24 27 30 30   GLUCOSE 144* 118* 106* 162* 111*  BUN 26* 28* 23 32* 33*  CREATININE 1.04 0.96 1.01 1.30* 1.16  CALCIUM 8.7* 8.6* 8.3* 8.3* 8.5*  MG  --   --  1.8  --   --    GFR: Estimated Creatinine Clearance: 68.5 mL/min (by C-G formula based on SCr of 1.16 mg/dL). Liver Function Tests: Recent Labs  Lab 11/12/18 2045 11/13/18 0341 11/15/18 0339 11/16/18 0349  AST 218* 186* 91* 58*  ALT 84* 81* 73* 64*  ALKPHOS 72 69 57 55  BILITOT 0.6 0.8 0.3 0.6  PROT 7.2 6.9 6.2* 6.6  ALBUMIN 2.5* 2.4* 2.1* 2.0*   No results for input(s): LIPASE, AMYLASE in the last 168 hours. No results for input(s): AMMONIA in the last 168 hours. Coagulation Profile:  No results for input(s): INR, PROTIME in the last 168 hours. Cardiac Enzymes: Recent Labs  Lab 11/13/18 0341 11/14/18 0804  CKTOTAL 3,156* 1,149*   BNP (last 3 results) No results for input(s): PROBNP in the last 8760 hours. HbA1C: No results for input(s): HGBA1C in the last 72 hours. CBG: No results for input(s): GLUCAP in the last 168 hours. Lipid Profile: No results for input(s): CHOL, HDL, LDLCALC, TRIG, CHOLHDL, LDLDIRECT in the last 72 hours. Thyroid Function Tests: No results for input(s): TSH, T4TOTAL, FREET4, T3FREE, THYROIDAB in the last 72 hours. Anemia Panel: No results for input(s): VITAMINB12, FOLATE, FERRITIN, TIBC, IRON, RETICCTPCT in the last 72 hours. Sepsis Labs: Recent Labs  Lab 11/12/18 2044 11/13/18 0049  LATICACIDVEN 1.4 1.4    Recent Results (from the past 240 hour(s))  Blood culture (routine x 2)     Status: None (Preliminary result)   Collection Time: 11/13/18 12:30 AM   Specimen: BLOOD RIGHT HAND  Result Value Ref Range Status    Specimen Description BLOOD RIGHT HAND  Final   Special Requests   Final    BOTTLES DRAWN AEROBIC AND ANAEROBIC Blood Culture results may not be optimal due to an inadequate volume of blood received in culture bottles   Culture   Final    NO GROWTH 4 DAYS Performed at [facility], [place] [address], [place], [place] [address]    Report Status PENDING  Incomplete  Blood culture (routine x 2)     Status: None (Preliminary result)   Collection Time: 11/13/18 12:50 AM   Specimen: BLOOD RIGHT WRIST  Result Value Ref Range Status   Specimen Description BLOOD RIGHT WRIST  Final   Special Requests   Final    BOTTLES DRAWN AEROBIC AND ANAEROBIC Blood Culture adequate volume   Culture   Final    NO GROWTH 4 DAYS Performed at [facility], [place] [address] [place], [place] [address]    Report Status PENDING  Incomplete  Urine Culture     Status: Abnormal   Collection Time: 11/13/18 12:50 AM   Specimen: Urine, Random  Result Value Ref Range Status   Specimen Description URINE, RANDOM  Final   Special Requests NONE  Final   Culture (A)  Final    <10,000 COLONIES/mL INSIGNIFICANT GROWTH Performed at [facility], [place] [address] [place], [place] [address]    Report Status 11/14/2018 FINAL  Final  SARS CORONAVIRUS 2 (TAT 6-24 HRS) Nasopharyngeal Nasopharyngeal Swab     Status: None   Collection Time: 11/13/18  2:28 AM   Specimen: Nasopharyngeal Swab  Result Value Ref Range Status   SARS Coronavirus 2 NEGATIVE NEGATIVE Final    Comment: (NOTE) SARS-CoV-2 target nucleic acids are NOT DETECTED. The SARS-CoV-2 RNA is generally detectable in upper and lower respiratory specimens during the acute phase of infection. Negative results do not preclude SARS-CoV-2 infection, do not rule out co-infections with other pathogens, and should not be used as the sole basis for treatment or other patient management decisions. Negative results must be combined with clinical observations,  patient history, and epidemiological information. The expected result is Negative. Fact Sheet for Patients: SugarRoll.be Fact Sheet for Healthcare Providers: https://www.woods-mathews.com/ This test is not yet approved or cleared by the [place] FDA and  has been authorized for detection and/or diagnosis of SARS-CoV-2 by FDA under an Emergency Use Authorization (EUA). This EUA will remain  in effect (meaning this test can be used) for  the duration of the COVID-19 declaration under Section 56 4(b)(1) of the Act, 21 U.S.C. section 360bbb-3(b)(1), unless the authorization is terminated or revoked sooner. Performed at [facility], [place] [address] [place], [place] [address]          Radiology Studies: Dg Chest Port 1 View  Result Date: 11/16/2018 CLINICAL DATA:  Shortness of breath and cough EXAM: PORTABLE CHEST 1 VIEW COMPARISON:  11/13/2018 FINDINGS: Cardiac shadow is stable but slightly accentuated by the portable technique. Very mild vascular congestion is noted increased from the prior exam with minimal interstitial edema. No sizable effusion or focal infiltrate is noted. No bony abnormality is seen. IMPRESSION: Changes of mild vascular congestion and interstitial edema. Electronically Signed   By: Inez Catalina M.D.   On: 11/16/2018 16:25        Scheduled Meds: . amLODipine  10 mg Oral QHS  . aspirin  81 mg Oral QPM  . atorvastatin  20 mg Oral QPM  . cholecalciferol  1,000 Units Oral BID  . enoxaparin (LOVENOX) injection  40 mg Subcutaneous Q24H  . feeding supplement (ENSURE ENLIVE)  237 mL Oral BID BM  . feeding supplement (PRO-STAT SUGAR FREE 64)  30 mL Oral BID  . ferrous gluconate  324 mg Oral Daily  . furosemide  40 mg Intravenous Q12H  . guaiFENesin  600 mg Oral BID  . ipratropium-albuterol  3 mL Nebulization Q6H  . methylPREDNISolone (SOLU-MEDROL) injection  40 mg Intravenous Q12H  . metoprolol succinate  100 mg  Oral QHS  . sodium chloride flush  3 mL Intravenous Once   Continuous Infusions: .  ceFAZolin (ANCEF) IV 2 g (11/17/18 0753)     LOS: 3 days    Time spent: 35 mins.More than 50% of that time was spent in counseling and/or coordination of care.      Shelly Coss, MD [facility] Pager [phone]  If 7PM-7AM, please contact night-coverage [email or web address] Password [email or web address] 11/17/2018, 1:59 PM

## 2018-11-17 NOTE — TOC Progression Note (Signed)
Transition of Care [facility]) - Progression Note    Patient Details  Name: [name] MRN: [id number] Date of Birth: 1935-05-08  Transition of Care [facility]) CM/SW [place], [place] Phone Number: 11/17/2018, 11:27 AM  Clinical Narrative:   CSW met with patient to discuss discharge plans. CSW explained recommendation for CIR but there is concern on whether the patient can tolerate it or not. Patient expressed frustration in being stopped from walking every time therapy works with him because the monitors go off with something wrong and then he's told to sit back down. Patient said he's happy that he was able to take a couple of steps yesterday, but he wanted to do more and they stopped him. Patient says he's not "a quitter" and is very motivated to get better and get back home. Patient preference is to stay in CIR if he qualifies, but the family is looking at [facility] as a back-up option if he can't stay at the hospital. CSW to send referral and follow.    Expected Discharge Plan: IP Rehab Facility Barriers to Discharge: Continued Medical Work up  Expected Discharge Plan and Services Expected Discharge Plan: [place]     Post Acute Care Choice: IP Rehab Living arrangements for the past 2 months: Single Family Home                                       Social Determinants of Health (SDOH) Interventions    Readmission Risk Interventions No flowsheet data found.

## 2018-11-17 NOTE — Progress Notes (Signed)
Inpatient Rehabilitation Admissions Coordinator  I met with patient and wife at bedside and discussed that pt not yet at a level to be considered for an inpt rehab admit. I am recommending SNF at this time due to his tolerance.  Danne Baxter, RN, MSN Rehab Admissions Coordinator [phone] 11/17/2018 1:25 PM

## 2018-11-18 LAB — CULTURE, BLOOD (ROUTINE X 2)
Culture: NO GROWTH
Culture: NO GROWTH
Special Requests: ADEQUATE

## 2018-11-18 LAB — BASIC METABOLIC PANEL
Anion gap: 12 (ref 5–15)
BUN: 37 mg/dL — ABNORMAL HIGH (ref 8–23)
CO2: 33 mmol/L — ABNORMAL HIGH (ref 22–32)
Calcium: 8.7 mg/dL — ABNORMAL LOW (ref 8.9–10.3)
Chloride: 95 mmol/L — ABNORMAL LOW (ref 98–111)
Creatinine, Ser: 1.18 mg/dL (ref 0.61–1.24)
GFR calc Af Amer: >60 mL/min (ref >60)
GFR calc non Af Amer: 58 mL/min — ABNORMAL LOW (ref >60)
Glucose, Bld: 160 mg/dL — ABNORMAL HIGH (ref 70–99)
Potassium: 4.2 mmol/L (ref 3.5–5.1)
Sodium: 140 mmol/L (ref 135–145)

## 2018-11-18 NOTE — Progress Notes (Signed)
Subjective: Reports pain with touching and attempts to remove right sock. Compression sleeves were washed but not reapplied.   Objective: Vital signs in last 24 hours: Temp:  [97.8 F (36.6 C)-98.8 F (37.1 C)] 97.8 F (36.6 C) (10/09 0700) Pulse Rate:  [61-76] 71 (10/09 0900) Resp:  [0-23] 22 (10/09 0900) BP: (113-148)/(57-81) 120/62 (10/09 0900) SpO2:  [93 %-98 %] 95 % (10/09 0900)  Intake/Output from previous day: 10/08 0701 - 10/09 0700 In: 200 [IV Piggyback:200] Out: 3400 [Urine:3400] Intake/Output this shift: Total I/O In: 340 [P.O.:240; IV Piggyback:100] Out: -   Recent Labs    11/16/18 0349 11/17/18 0346  HGB 10.7* 10.4*   Recent Labs    11/16/18 0349 11/17/18 0346  WBC 18.5* 13.1*  RBC 3.56* 3.49*  HCT 34.2* 32.9*  PLT 409* 412*   Recent Labs    11/16/18 0349 11/18/18 0429  NA 141 140  K 4.1 4.2  CL 102 95*  CO2 30 33*  BUN 33* 37*  CREATININE 1.16 1.18  GLUCOSE 111* 160*  CALCIUM 8.5* 8.7*   No results for input(s): LABPT, INR in the last 72 hours.  Edema improved bilateral lower legs. Dried crusting ulcers over the left lower leg and anterior right leg and shallow open ulcer of right lower leg. Does not tolerate full removal of sock on the right this am. No signs of cellulitis or infection.     Assessment/Plan: Venous insufficiency/ulceration bilateral lower legs- Recommend changing out socks daily and using second layer of compression during the daytime as noted. The single layer against the wound should be worn 24 hrs a day except for showering.  Follow up in the office in 1 week following discharge.    Erlinda Hong, PA-C 11/18/2018, 10:42 AM  [place] [facility] [phone]

## 2018-11-18 NOTE — Progress Notes (Signed)
Physical Therapy Treatment Patient Details Name: [name] MRN: [id number] DOB: 17-Jan-1936 Today's Date: 11/18/2018    History of Present Illness [name] is a 83 y.o. male with medical history significant of CAD, colon Ca, BLE lymph edema and venous stasis ulcers.Patient was hospitalized at [facility] x2 days ago for 1 day after worsening wounds to leg.  Sent home with script for bactrim for UTI which he didn't fill due to sulfa allergy.After going home patient has been unable to stand due to weakness in his legs and unable to walk for the past one day or so.  Has chronic low back pain which is unchanged.    PT Comments    Pt agreeable to getting up and walking with therapy. Pt educated on need to focus on slow deep breathing with ambulation, as during last session experienced O2 desaturation. Pt maxAx2 for bed mobility, mod A for sit>stand and modA for ambulation of 6 feet with RW. Pt able to maintain SaO2 >89%O2 throughout ambulation. PT continues to recommend CIR level rehab as he is motivated and is making slow progress. PT will continue to follow acutely.    Follow Up Recommendations  CIR     Equipment Recommendations  None recommended by PT    Recommendations for Other Services Rehab consult     Precautions / Restrictions Precautions Precautions: Fall Precaution Comments: monitor drainage from compression garments Restrictions Weight Bearing Restrictions: No    Mobility  Bed Mobility Overal bed mobility: Needs Assistance Bed Mobility: Supine to Sit;Sit to Supine     Supine to sit: Max assist;+2 for physical assistance     General bed mobility comments: maxAx2 for bringing trunk to upright  Transfers Overall transfer level: Needs assistance Equipment used: Rolling walker (2 wheeled) Transfers: Sit to/from Stand Sit to Stand: From elevated surface;Mod assist         General transfer comment: modA for power up and steadying at RW, slight lightheadedness  which dissipated quickly    Ambulation/Gait Ambulation/Gait assistance: Min assist;Mod assist Gait Distance (Feet): 6 Feet Assistive device: Rolling walker (2 wheeled);1 person hand held assist Gait Pattern/deviations: Wide base of support;Trunk flexed;Decreased weight shift to left;Decreased step length - right;Decreased step length - left;Shuffle;Step-to pattern Gait velocity: reduced Gait velocity interpretation: <1.31 ft/sec, indicative of household ambulator General Gait Details: min-modA for steadying with RW, decreased L knee extension, contributing to unsteady gait       Balance Overall balance assessment: History of Falls;Needs assistance Sitting-balance support: Feet supported;Bilateral upper extremity supported Sitting balance-Leahy Scale: Fair     Standing balance support: Bilateral upper extremity supported;During functional activity Standing balance-Leahy Scale: Poor Standing balance comment: poor but with cues can get more upright                            Cognition Arousal/Alertness: Awake/alert Behavior During Therapy: WFL for tasks assessed/performed Overall Cognitive Status: Within Functional Limits for tasks assessed                                           General Comments General comments (skin integrity, edema, etc.): SaO2 on 3L via [place] remained >89%O2 even with ambulation, pt with obvious concentration on deep breathing      Pertinent Vitals/Pain Pain Assessment: Faces Faces Pain Scale: Hurts even more Pain Location: B lower legs Pain  Descriptors / Indicators: Grimacing Pain Intervention(s): Limited activity within patient's tolerance;Monitored during session;Repositioned           PT Goals (current goals can now be found in the care plan section) Acute Rehab PT Goals Patient Stated Goal: to get stronger and get home PT Goal Formulation: With patient/family Time For Goal Achievement: 11/28/18 Potential to Achieve  Goals: Good Progress towards PT goals: Progressing toward goals    Frequency    Min 3X/week      PT Plan Current plan remains appropriate       AM-PAC PT "6 Clicks" Mobility   Outcome Measure  Help needed turning from your back to your side while in a flat bed without using bedrails?: A Lot Help needed moving from lying on your back to sitting on the side of a flat bed without using bedrails?: A Lot Help needed moving to and from a bed to a chair (including a wheelchair)?: A Lot Help needed standing up from a chair using your arms (e.g., wheelchair or bedside chair)?: A Lot Help needed to walk in hospital room?: A Little Help needed climbing 3-5 steps with a railing? : Total 6 Click Score: 12    End of Session Equipment Utilized During Treatment: Gait belt;Oxygen Activity Tolerance: Patient tolerated treatment well;Patient limited by fatigue;Treatment limited secondary to medical complications (Comment) Patient left: with call bell/phone within reach;with family/visitor present;in chair Nurse Communication: Mobility status PT Visit Diagnosis: Unsteadiness on feet (R26.81);Muscle weakness (generalized) (M62.81);Difficulty in walking, not elsewhere classified (R26.2)     Time: [id number] PT Time Calculation (min) (ACUTE ONLY): 23 min  Charges:  $Gait Training: 23-37 mins                     Natalie Leclaire B. Migdalia Dk PT, DPT Acute Rehabilitation Services Pager [phone] Office [phone]    [place] 11/18/2018, 4:43 PM

## 2018-11-18 NOTE — Progress Notes (Signed)
Compression socks changed out for a new pair. Cleansed legs with soap and water and allowed to rest. A purple/blue line was noted above the right foot where the sock likely compressed a crease. The discoloration did not resolve while the socks were off. Legs are purple-red/ruddy with multiple venous stasis ulcers bilaterally. The right leg is the more severe of the two with larger, deeper, and more open wounds than the left. The right leg drains rusty colored serosanguinous drainage through the sock. Patient had quite a bit of pain during removal and reapplication of the socks and he therefore refused the additional layer of compression today. The patient and his wife do not seem to like this form of treatment and state if results don't become evident fairly soon, they want to go back to wrapping the patient's legs.

## 2018-11-18 NOTE — TOC Progression Note (Signed)
Transition of Care [facility]) - Progression Note    Patient Details  Name: [name] MRN: [id number] Date of Birth: 1936-01-18  Transition of Care [facility]) CM/SW [place], [place] Phone Number: 11/18/2018, 11:24 AM  Clinical Narrative:   CSW met with patient and wife at bedside to discuss SNF placement. Wife is still reviewing options, no decision made at this time. Patient and wife asking about patient needing oxygen and insurance coverage, and CSW answered questions. Wife to continue to review SNF options and will get back to CSW with choice.    Expected Discharge Plan: [place] Barriers to Discharge: Continued Medical Work up  Expected Discharge Plan and Services Expected Discharge Plan: [place] Acute Care Choice: IP Rehab Living arrangements for the past 2 months: Single Family Home                                       Social Determinants of Health (SDOH) Interventions    Readmission Risk Interventions No flowsheet data found.

## 2018-11-18 NOTE — TOC Progression Note (Signed)
Transition of Care [facility]) - Progression Note    Patient Details  Name: [name] MRN: [id number] Date of Birth: [birth year]  Transition of Care [facility]) CM/SW [place], [place] Phone Number: 11/18/2018, 8:59 AM  Clinical Narrative:   CSW alerted by RN that patient's wife at bedside and disappointed in CIR decision, has questions for CSW. CSW met with patient and wife at bedside to discuss SNF placement. Patient's wife frustrated that patient is not a candidate for CIR, wished that would have been addressed sooner. CSW explained that patient was talked to this morning about the possibility, but patient never expressed any of that information to the patient's wife. Wife asked questions about SNF placement and CSW answered, discussed expectations and concerns. CSW printed out CMS lists for both near patient's home in [place] as well as near [place], as patient's wife would want the patient to be brought back to [facility] if he were to deteriorate. Wife to research options and let CSW know about choices.     Expected Discharge Plan: IP Rehab Facility Barriers to Discharge: Continued Medical Work up  Expected Discharge Plan and Services Expected Discharge Plan: [place]     Post Acute Care Choice: IP Rehab Living arrangements for the past 2 months: Single Family Home                                       Social Determinants of Health (SDOH) Interventions    Readmission Risk Interventions No flowsheet data found.

## 2018-11-18 NOTE — Progress Notes (Signed)
PROGRESS NOTE    [name]  [id number] DOB: [birth year] DOA: 11/12/2018 PCP: Renne Crigler, NP   Brief Narrative: Patient is 83 year old male with history of coronary disease, colon cancer, bilateral lower extremity lymphedema, venous stasis ulcer who was admitted here for the management of bilateral lower extremity cellulitis.  He was started on broad-spectrum antibiotics.  Orthopedics was following and recommended compression stockings.  PT/OT evaluated him and recommended CIR.Cir recommending SNF now.SW consulted.  Patient is hemodynamically stable for discharge to skilled nursing facility as soon as the bed is available.  Will change antibiotics to oral on discharge.  Assessment & Plan:   Principal Problem:   Bilateral leg weakness Active Problems:   HTN (hypertension)   Venous stasis ulcer (HCC)   Weakness of both lower extremities   Abnormal MRI   Bacteria in urine   Transaminitis   Bilateral lower extremity cellulitis/ulcer: Has history of venous insufficiency, lymphedema of bilateral lower extremities.  Presented with cellulitis along with petechiae, purpuric blisters.  This is been ongoing for last 5 years.  Did not respond to oral antibiotics that was started as an outpatient.  Also has mild  leukocytosis.  Cultures negative so far.  He was initially  started on Zosyn. We  Changed antibiotics to cefazolin.We will continue IV antibiotics till discharge. Orthopedics recommend: changing out socks daily and using second layer of compression during the daytime as noted. The single layer against the wound should be worn 24 hrs a day except for showering. Follow up in the office in 1 week following discharge  Acute respiratory with hypoxia: New problem.  He was found to have bilateral expiratory wheezes .  He desaturated on ambulation. He was a previous smoker.  He might have underlying COPD.  Started on steroids .Chest Xray showed mild vascular congestion/interstitial edema.  He  was given 2 doses of Lasix.  We does not auscultated today.  We will continue to taper the oxygen.He might need oxygen on discharge.  Abnormal MRI/bilateral lower extremity weakness: MRI showed subacute L4 fracture, neural foraminal stenosis, large osteophyte at L5-S1.  Patient does not complain of any back pain today.  He was seen by physical therapy and recommended CIR/SNF  Asymptomatic bacteriuria: Denies any dysuria.  Urine culture did not show any significant growth  Hypertension: Blood pressure currently stable.  ACE inhibitor and diuretics on hold due to bump in creatinine.  Continue Norvasc and Toprol.  Transaminitis: LFT trended down.  Continue to monitor.  Patient currently asymptomatic.  Elevated liver enzymes most likely associated with elevated CK level.  Elevated CK: Started on IV fluids which has been stopped because patient developed wheezing.CK has improved.   Nutrition Problem: Increased nutrient needs Etiology: wound healing      DVT prophylaxis: Lovenox Code Status: Full Family Communication: Wife at bedside on 11/16/18 Disposition Plan: Patient is hemodynamically stable for discharge to skilled nursing facility soon as the bed is available.   Consultants: Ortho  Procedures:None  Antimicrobials:  Anti-infectives (From admission, onward)   Start     Dose/Rate Route Frequency Ordered Stop   11/16/18 1600  ceFAZolin (ANCEF) IVPB 2g/100 mL premix     2 g 200 mL/hr over 30 Minutes Intravenous Every 8 hours 11/16/18 1323     11/16/18 0300  vancomycin (VANCOCIN) 1,500 mg in sodium chloride 0.9 % 500 mL IVPB  Status:  Discontinued     1,500 mg 250 mL/hr over 120 Minutes Intravenous Every 24 hours 11/15/18 1305 11/15/18 1312  11/14/18 0300  vancomycin (VANCOCIN) 2,500 mg in sodium chloride 0.9 % 500 mL IVPB  Status:  Discontinued     2,500 mg 250 mL/hr over 120 Minutes Intravenous Every 24 hours 11/13/18 0223 11/15/18 1305   11/13/18 1000  piperacillin-tazobactam  (ZOSYN) IVPB 3.375 g  Status:  Discontinued     3.375 g 12.5 mL/hr over 240 Minutes Intravenous Every 8 hours 11/13/18 0223 11/16/18 1310   11/13/18 0200  vancomycin (VANCOCIN) IVPB 1000 mg/200 mL premix  Status:  Discontinued     1,000 mg 200 mL/hr over 60 Minutes Intravenous  Once 11/13/18 0154 11/13/18 0156   11/13/18 0200  piperacillin-tazobactam (ZOSYN) IVPB 3.375 g     3.375 g 100 mL/hr over 30 Minutes Intravenous  Once 11/13/18 0154 11/13/18 0237   11/13/18 0200  vancomycin (VANCOCIN) 2,000 mg in sodium chloride 0.9 % 500 mL IVPB     2,000 mg 250 mL/hr over 120 Minutes Intravenous  Once 11/13/18 0156 11/13/18 0657      Subjective: Patient seen and examined the bedside this morning.  Neurologically stable.  He was on 4 L of oxygen which I turned down to 2 L.  He does not have any kind of respiratory distress.  Feels comfortable and denies any shortness of breath or cough on rest.  Wheezes much improved today.  He was not happy about being denied by CIR .  Objective: Vitals:   11/18/18 0400 11/18/18 0700 11/18/18 0900 11/18/18 1100  BP: (!) 148/81 140/70 120/62 127/64  Pulse: 71 76 71 72  Resp: 15 20 (!) 22 (!) 29  Temp: 98.8 F (37.1 C) 97.8 F (36.6 C)  98 F (36.7 C)  TempSrc: Axillary Oral  Oral  SpO2: 98% 96% 95% 92%  Weight:      Height:        Intake/Output Summary (Last 24 hours) at 11/18/2018 1151 Last data filed at 11/18/2018 0751 Gross per 24 hour  Intake 440 ml  Output 2400 ml  Net -1960 ml   Filed Weights   11/16/18 0643  Weight: 112 kg    Examination:  General exam: Pleasant elderly male, deconditioned/debilitated  HEENT:PERRL,Oral mucosa moist, Ear/Nose normal on gross exam Respiratory system: Bilateral mild decreased air entry but no wheezes. Cardiovascular system: S1 & S2 heard, RRR. No JVD, murmurs, rubs, gallops or clicks. No pedal edema. Gastrointestinal system: Abdomen is nondistended, soft and nontender. No organomegaly or masses felt.  Normal bowel sounds heard. Central nervous system: Alert and oriented. No focal neurological deficits. Extremities: Bilateral lower extremity wrapped with compression socks. Picture taken few days ago:     Data Reviewed: I have personally reviewed following labs and imaging studies  CBC: Recent Labs  Lab 11/12/18 2045 11/13/18 0341 11/14/18 0804 11/15/18 0339 11/16/18 0349 11/17/18 0346  WBC 12.4* 12.4* 11.7* 11.0* 18.5* 13.1*  NEUTROABS 10.2*  --  9.5* 9.8*  --  12.6*  HGB 11.9* 11.4* 10.8* 10.7* 10.7* 10.4*  HCT 37.4* 35.2* 33.2* 33.7* 34.2* 32.9*  MCV 93.3 91.9 92.0 93.9 96.1 94.3  PLT 439* 410* 401* 404* 409* 123456*   Basic Metabolic Panel: Recent Labs  Lab 11/13/18 0341 11/14/18 0804 11/15/18 0339 11/16/18 0349 11/18/18 0429  NA 139 140 140 141 140  K 3.7 4.1 4.0 4.1 4.2  CL 104 105 99 102 95*  CO2 24 27 30 30  33*  GLUCOSE 118* 106* 162* 111* 160*  BUN 28* 23 32* 33* 37*  CREATININE 0.96 1.01 1.30* 1.16 1.18  CALCIUM 8.6* 8.3* 8.3* 8.5* 8.7*  MG  --  1.8  --   --   --    GFR: Estimated Creatinine Clearance: 67.3 mL/min (by C-G formula based on SCr of 1.18 mg/dL). Liver Function Tests: Recent Labs  Lab 11/12/18 2045 11/13/18 0341 11/15/18 0339 11/16/18 0349  AST 218* 186* 91* 58*  ALT 84* 81* 73* 64*  ALKPHOS 72 69 57 55  BILITOT 0.6 0.8 0.3 0.6  PROT 7.2 6.9 6.2* 6.6  ALBUMIN 2.5* 2.4* 2.1* 2.0*   No results for input(s): LIPASE, AMYLASE in the last 168 hours. No results for input(s): AMMONIA in the last 168 hours. Coagulation Profile: No results for input(s): INR, PROTIME in the last 168 hours. Cardiac Enzymes: Recent Labs  Lab 11/13/18 0341 11/14/18 0804  CKTOTAL 3,156* 1,149*   BNP (last 3 results) No results for input(s): PROBNP in the last 8760 hours. HbA1C: No results for input(s): HGBA1C in the last 72 hours. CBG: No results for input(s): GLUCAP in the last 168 hours. Lipid Profile: No results for input(s): CHOL, HDL, LDLCALC,  TRIG, CHOLHDL, LDLDIRECT in the last 72 hours. Thyroid Function Tests: No results for input(s): TSH, T4TOTAL, FREET4, T3FREE, THYROIDAB in the last 72 hours. Anemia Panel: No results for input(s): VITAMINB12, FOLATE, FERRITIN, TIBC, IRON, RETICCTPCT in the last 72 hours. Sepsis Labs: Recent Labs  Lab 11/12/18 2044 11/13/18 0049  LATICACIDVEN 1.4 1.4    Recent Results (from the past 240 hour(s))  Blood culture (routine x 2)     Status: None   Collection Time: 11/13/18 12:30 AM   Specimen: BLOOD RIGHT HAND  Result Value Ref Range Status   Specimen Description BLOOD RIGHT HAND  Final   Special Requests   Final    BOTTLES DRAWN AEROBIC AND ANAEROBIC Blood Culture results may not be optimal due to an inadequate volume of blood received in culture bottles   Culture   Final    NO GROWTH 5 DAYS Performed at [facility], [place] [address], [place], [place] [address]    Report Status 11/18/2018 FINAL  Final  Blood culture (routine x 2)     Status: None   Collection Time: 11/13/18 12:50 AM   Specimen: BLOOD RIGHT WRIST  Result Value Ref Range Status   Specimen Description BLOOD RIGHT WRIST  Final   Special Requests   Final    BOTTLES DRAWN AEROBIC AND ANAEROBIC Blood Culture adequate volume   Culture   Final    NO GROWTH 5 DAYS Performed at [facility], [place] [address] [place], [place] [address]    Report Status 11/18/2018 FINAL  Final  Urine Culture     Status: Abnormal   Collection Time: 11/13/18 12:50 AM   Specimen: Urine, Random  Result Value Ref Range Status   Specimen Description URINE, RANDOM  Final   Special Requests NONE  Final   Culture (A)  Final    <10,000 COLONIES/mL INSIGNIFICANT GROWTH Performed at [facility], [place] [address], [place], [place] [address]    Report Status 11/14/2018 FINAL  Final  SARS CORONAVIRUS 2 (TAT 6-24 HRS) Nasopharyngeal Nasopharyngeal Swab     Status: None   Collection Time: 11/13/18  2:28 AM   Specimen: Nasopharyngeal  Swab  Result Value Ref Range Status   SARS Coronavirus 2 NEGATIVE NEGATIVE Final    Comment: (NOTE) SARS-CoV-2 target nucleic acids are NOT DETECTED. The SARS-CoV-2 RNA is generally detectable in upper and lower respiratory specimens  during the acute phase of infection. Negative results do not preclude SARS-CoV-2 infection, do not rule out co-infections with other pathogens, and should not be used as the sole basis for treatment or other patient management decisions. Negative results must be combined with clinical observations, patient history, and epidemiological information. The expected result is Negative. Fact Sheet for Patients: SugarRoll.be Fact Sheet for Healthcare Providers: https://www.woods-mathews.com/ This test is not yet approved or cleared by the [place] FDA and  has been authorized for detection and/or diagnosis of SARS-CoV-2 by FDA under an Emergency Use Authorization (EUA). This EUA will remain  in effect (meaning this test can be used) for the duration of the COVID-19 declaration under Section 56 4(b)(1) of the Act, 21 U.S.C. section 360bbb-3(b)(1), unless the authorization is terminated or revoked sooner. Performed at [facility], [place] [address], [place], [place] [address]   SARS CORONAVIRUS 2 (TAT 6-24 HRS) Nasopharyngeal Nasopharyngeal Swab     Status: None   Collection Time: 11/17/18  3:52 PM   Specimen: Nasopharyngeal Swab  Result Value Ref Range Status   SARS Coronavirus 2 NEGATIVE NEGATIVE Final    Comment: (NOTE) SARS-CoV-2 target nucleic acids are NOT DETECTED. The SARS-CoV-2 RNA is generally detectable in upper and lower respiratory specimens during the acute phase of infection. Negative results do not preclude SARS-CoV-2 infection, do not rule out co-infections with other pathogens, and should not be used as the sole basis for treatment or other patient management decisions. Negative results must  be combined with clinical observations, patient history, and epidemiological information. The expected result is Negative. Fact Sheet for Patients: SugarRoll.be Fact Sheet for Healthcare Providers: https://www.woods-mathews.com/ This test is not yet approved or cleared by the [place] FDA and  has been authorized for detection and/or diagnosis of SARS-CoV-2 by FDA under an Emergency Use Authorization (EUA). This EUA will remain  in effect (meaning this test can be used) for the duration of the COVID-19 declaration under Section 56 4(b)(1) of the Act, 21 U.S.C. section 360bbb-3(b)(1), unless the authorization is terminated or revoked sooner. Performed at [facility], [place] [address] [place], [place] [address]          Radiology Studies: Dg Chest Port 1 View  Result Date: 11/16/2018 CLINICAL DATA:  Shortness of breath and cough EXAM: PORTABLE CHEST 1 VIEW COMPARISON:  11/13/2018 FINDINGS: Cardiac shadow is stable but slightly accentuated by the portable technique. Very mild vascular congestion is noted increased from the prior exam with minimal interstitial edema. No sizable effusion or focal infiltrate is noted. No bony abnormality is seen. IMPRESSION: Changes of mild vascular congestion and interstitial edema. Electronically Signed   By: Inez Catalina M.D.   On: 11/16/2018 16:25        Scheduled Meds: . amLODipine  10 mg Oral QHS  . aspirin  81 mg Oral QPM  . atorvastatin  20 mg Oral QPM  . cholecalciferol  1,000 Units Oral BID  . enoxaparin (LOVENOX) injection  40 mg Subcutaneous Q24H  . feeding supplement (ENSURE ENLIVE)  237 mL Oral BID BM  . feeding supplement (PRO-STAT SUGAR FREE 64)  30 mL Oral BID  . ferrous gluconate  324 mg Oral Daily  . guaiFENesin  600 mg Oral BID  . methylPREDNISolone (SOLU-MEDROL) injection  40 mg Intravenous Q12H  . metoprolol succinate  100 mg Oral QHS  . sodium chloride flush  3 mL  Intravenous Once   Continuous Infusions: .  ceFAZolin (ANCEF) IV 2  g (11/18/18 0751)     LOS: 4 days    Time spent: 35 mins.More than 50% of that time was spent in counseling and/or coordination of care.      Shelly Coss, MD [facility] Pager [phone]  If 7PM-7AM, please contact night-coverage [email or web address] Password [email or web address] 11/18/2018, 11:51 AM

## 2018-11-19 ENCOUNTER — Inpatient Hospital Stay (HOSPITAL_COMMUNITY): Payer: Medicare Other

## 2018-11-19 DIAGNOSIS — I361 Nonrheumatic tricuspid (valve) insufficiency: Secondary | ICD-10-CM

## 2018-11-19 LAB — ECHOCARDIOGRAM COMPLETE
Height: 76 in
Weight: 3950.64 oz

## 2018-11-19 MED ORDER — LEVOFLOXACIN 500 MG PO TABS
750.0000 mg | ORAL_TABLET | Freq: Every day | ORAL | Status: DC
  Administered 2018-11-19 – 2018-11-20 (×2): 750 mg via ORAL
  Filled 2018-11-19 (×2): qty 2

## 2018-11-19 MED ORDER — LEVOFLOXACIN 500 MG PO TABS: 500.0000 mg | ORAL_TABLET | Freq: Every day | ORAL | Status: DC

## 2018-11-19 MED ORDER — IOHEXOL 350 MG/ML SOLN
75.0000 mL | Freq: Once | INTRAVENOUS | Status: AC | PRN
  Administered 2018-11-19: 75 mL via INTRAVENOUS

## 2018-11-19 NOTE — Progress Notes (Signed)
Echocardiogram 2D Echocardiogram has been performed.  [place] Deputy Justene Jensen 11/19/2018, 3:44 PM

## 2018-11-19 NOTE — Progress Notes (Addendum)
PROGRESS NOTE    [name]  [id number] DOB: 1935-08-12 DOA: 11/12/2018 PCP: Renne Crigler, NP   Brief Narrative: Patient is 83 year old male with history of coronary disease, colon cancer, bilateral lower extremity lymphedema, venous stasis ulcer who was admitted here for the management of bilateral lower extremity cellulitis.  He was started on broad-spectrum antibiotics.  Orthopedics was following and recommended compression stockings.  PT/OT evaluated him and recommended CIR.Cir recommending SNF now.SW consulted.  Patient is hemodynamically stable for discharge to skilled nursing facility as soon as the bed is available.  Will change antibiotics to oral on discharge.  Assessment & Plan:   Principal Problem:   Bilateral leg weakness Active Problems:   HTN (hypertension)   Venous stasis ulcer (HCC)   Weakness of both lower extremities   Abnormal MRI   Bacteria in urine   Transaminitis   Bilateral lower extremity cellulitis/ulcer: Has history of venous insufficiency, lymphedema of bilateral lower extremities.  Presented with cellulitis along with petechiae, purpuric blisters.  This is been ongoing for last 5 years.  Did not respond to oral antibiotics that was started as an outpatient.  Also has mild  leukocytosis.  Cultures negative so far.  He was initially  started on Zosyn. We  Changed antibiotics to cefazolin.We will continue IV antibiotics till discharge. Orthopedics recommend: changing out socks daily and using second layer of compression during the daytime as noted. The single layer against the wound should be worn 24 hrs a day except for showering. Follow up in the office in 1 week following discharge  Acute respiratory with hypoxia: New problem.  He was found to have bilateral expiratory wheezes .  He desaturated on ambulation. He was a previous smoker.  He might have underlying COPD.  Started on steroids with improvement on wheezes but he is still hypoxic needs oxygen  supplementation .Chest Xray showed mild vascular congestion/interstitial edema.  He was given 2 doses of Lasix.   We will continue to try to  taper the oxygen. I will check CT angios rule out PE and also order echocardiogram.  Abnormal MRI/bilateral lower extremity weakness: MRI showed subacute L4 fracture, neural foraminal stenosis, large osteophyte at L5-S1.  Patient does not complain of any back pain today.  He was seen by physical therapy and recommended CIR/SNF  Asymptomatic bacteriuria: Denies any dysuria.  Urine culture did not show any significant growth  Hypertension: Blood pressure currently stable.  ACE inhibitor and diuretics on hold due to bump in creatinine.  Continue Norvasc and Toprol.BP stable  Transaminitis: LFT trended down.  Continue to monitor.  Patient currently asymptomatic.  Elevated liver enzymes most likely associated with elevated CK level.  Elevated CK: Started on IV fluids which has been stopped because patient developed wheezing.CK has improved.   Nutrition Problem: Increased nutrient needs Etiology: wound healing      DVT prophylaxis: Lovenox Code Status: Full Family Communication: Called wife,call not received Disposition Plan: SnF   Consultants: Ortho  Procedures:None  Antimicrobials:  Anti-infectives (From admission, onward)   Start     Dose/Rate Route Frequency Ordered Stop   11/16/18 1600  ceFAZolin (ANCEF) IVPB 2g/100 mL premix     2 g 200 mL/hr over 30 Minutes Intravenous Every 8 hours 11/16/18 1323     11/16/18 0300  vancomycin (VANCOCIN) 1,500 mg in sodium chloride 0.9 % 500 mL IVPB  Status:  Discontinued     1,500 mg 250 mL/hr over 120 Minutes Intravenous Every 24 hours 11/15/18 1305  11/15/18 1312   11/14/18 0300  vancomycin (VANCOCIN) 2,500 mg in sodium chloride 0.9 % 500 mL IVPB  Status:  Discontinued     2,500 mg 250 mL/hr over 120 Minutes Intravenous Every 24 hours 11/13/18 0223 11/15/18 1305   11/13/18 1000   piperacillin-tazobactam (ZOSYN) IVPB 3.375 g  Status:  Discontinued     3.375 g 12.5 mL/hr over 240 Minutes Intravenous Every 8 hours 11/13/18 0223 11/16/18 1310   11/13/18 0200  vancomycin (VANCOCIN) IVPB 1000 mg/200 mL premix  Status:  Discontinued     1,000 mg 200 mL/hr over 60 Minutes Intravenous  Once 11/13/18 0154 11/13/18 0156   11/13/18 0200  piperacillin-tazobactam (ZOSYN) IVPB 3.375 g     3.375 g 100 mL/hr over 30 Minutes Intravenous  Once 11/13/18 0154 11/13/18 0237   11/13/18 0200  vancomycin (VANCOCIN) 2,000 mg in sodium chloride 0.9 % 500 mL IVPB     2,000 mg 250 mL/hr over 120 Minutes Intravenous  Once 11/13/18 0156 11/13/18 0657      Subjective: Patient seen and examined the bedside this morning.  Hemodynamically stable but he was still on 4 L of oxygen and looks little short of breath today.  Denies any new complaints.  Objective: Vitals:   11/19/18 0800 11/19/18 0900 11/19/18 1000 11/19/18 1141  BP: 116/70 134/64 124/68   Pulse: 60 (!) 58 63 60  Resp: 17 (!) 23 17 16   Temp:      TempSrc:      SpO2: 95% 96% 96% 93%  Weight:      Height:        Intake/Output Summary (Last 24 hours) at 11/19/2018 1301 Last data filed at 11/19/2018 [id number] Gross per 24 hour  Intake 440 ml  Output 1550 ml  Net -1110 ml   Filed Weights   11/16/18 0643  Weight: 112 kg    Examination:  General exam: Pleasant elderly male, deconditioned/debilitated  HEENT:PERRL,Oral mucosa moist, Ear/Nose normal on gross exam Respiratory system: Bilateral mild decreased air entry but no wheezes. Cardiovascular system: S1 & S2 heard, RRR. No JVD, murmurs, rubs, gallops or clicks. No pedal edema. Gastrointestinal system: Abdomen is nondistended, soft and nontender. No organomegaly or masses felt. Normal bowel sounds heard. Central nervous system: Alert and oriented. No focal neurological deficits. Extremities: Bilateral lower extremity wrapped with compression socks. Picture taken few days ago:      Data Reviewed: I have personally reviewed following labs and imaging studies  CBC: Recent Labs  Lab 11/12/18 2045 11/13/18 0341 11/14/18 0804 11/15/18 0339 11/16/18 0349 11/17/18 0346  WBC 12.4* 12.4* 11.7* 11.0* 18.5* 13.1*  NEUTROABS 10.2*  --  9.5* 9.8*  --  12.6*  HGB 11.9* 11.4* 10.8* 10.7* 10.7* 10.4*  HCT 37.4* 35.2* 33.2* 33.7* 34.2* 32.9*  MCV 93.3 91.9 92.0 93.9 96.1 94.3  PLT 439* 410* 401* 404* 409* [id number]*   Basic Metabolic Panel: Recent Labs  Lab 11/13/18 0341 11/14/18 0804 11/15/18 0339 11/16/18 0349 11/18/18 0429  NA 139 140 140 141 140  K 3.7 4.1 4.0 4.1 4.2  CL 104 105 99 102 95*  CO2 24 27 30 30  33*  GLUCOSE 118* 106* 162* 111* 160*  BUN 28* 23 32* 33* 37*  CREATININE 0.96 1.01 1.30* 1.16 1.18  CALCIUM 8.6* 8.3* 8.3* 8.5* 8.7*  MG  --  1.8  --   --   --    GFR: Estimated Creatinine Clearance: 67.3 mL/min (by C-G formula based on SCr of 1.18 mg/dL). Liver  Function Tests: Recent Labs  Lab 11/12/18 2045 11/13/18 0341 11/15/18 0339 11/16/18 0349  AST 218* 186* 91* 58*  ALT 84* 81* 73* 64*  ALKPHOS 72 69 57 55  BILITOT 0.6 0.8 0.3 0.6  PROT 7.2 6.9 6.2* 6.6  ALBUMIN 2.5* 2.4* 2.1* 2.0*   No results for input(s): LIPASE, AMYLASE in the last 168 hours. No results for input(s): AMMONIA in the last 168 hours. Coagulation Profile: No results for input(s): INR, PROTIME in the last 168 hours. Cardiac Enzymes: Recent Labs  Lab 11/13/18 0341 11/14/18 0804  CKTOTAL 3,156* 1,149*   BNP (last 3 results) No results for input(s): PROBNP in the last 8760 hours. HbA1C: No results for input(s): HGBA1C in the last 72 hours. CBG: No results for input(s): GLUCAP in the last 168 hours. Lipid Profile: No results for input(s): CHOL, HDL, LDLCALC, TRIG, CHOLHDL, LDLDIRECT in the last 72 hours. Thyroid Function Tests: No results for input(s): TSH, T4TOTAL, FREET4, T3FREE, THYROIDAB in the last 72 hours. Anemia Panel: No results for input(s):  VITAMINB12, FOLATE, FERRITIN, TIBC, IRON, RETICCTPCT in the last 72 hours. Sepsis Labs: Recent Labs  Lab 11/12/18 2044 11/13/18 0049  LATICACIDVEN 1.4 1.4    Recent Results (from the past 240 hour(s))  Blood culture (routine x 2)     Status: None   Collection Time: 11/13/18 12:30 AM   Specimen: BLOOD RIGHT HAND  Result Value Ref Range Status   Specimen Description BLOOD RIGHT HAND  Final   Special Requests   Final    BOTTLES DRAWN AEROBIC AND ANAEROBIC Blood Culture results may not be optimal due to an inadequate volume of blood received in culture bottles   Culture   Final    NO GROWTH 5 DAYS Performed at [facility], [place] [address] [place], [place] [address]    Report Status 11/18/2018 FINAL  Final  Blood culture (routine x 2)     Status: None   Collection Time: 11/13/18 12:50 AM   Specimen: BLOOD RIGHT WRIST  Result Value Ref Range Status   Specimen Description BLOOD RIGHT WRIST  Final   Special Requests   Final    BOTTLES DRAWN AEROBIC AND ANAEROBIC Blood Culture adequate volume   Culture   Final    NO GROWTH 5 DAYS Performed at [facility], [place] [address] [place], [place] [address]    Report Status 11/18/2018 FINAL  Final  Urine Culture     Status: Abnormal   Collection Time: 11/13/18 12:50 AM   Specimen: Urine, Random  Result Value Ref Range Status   Specimen Description URINE, RANDOM  Final   Special Requests NONE  Final   Culture (A)  Final    <10,000 COLONIES/mL INSIGNIFICANT GROWTH Performed at [facility], [place] [address], [place], [place] [address]    Report Status 11/14/2018 FINAL  Final  SARS CORONAVIRUS 2 (TAT 6-24 HRS) Nasopharyngeal Nasopharyngeal Swab     Status: None   Collection Time: 11/13/18  2:28 AM   Specimen: Nasopharyngeal Swab  Result Value Ref Range Status   SARS Coronavirus 2 NEGATIVE NEGATIVE Final    Comment: (NOTE) SARS-CoV-2 target nucleic acids are NOT DETECTED. The SARS-CoV-2 RNA is generally detectable in  upper and lower respiratory specimens during the acute phase of infection. Negative results do not preclude SARS-CoV-2 infection, do not rule out co-infections with other pathogens, and should not be used as the sole basis for treatment or other patient management decisions. Negative results  must be combined with clinical observations, patient history, and epidemiological information. The expected result is Negative. Fact Sheet for Patients: SugarRoll.be Fact Sheet for Healthcare Providers: https://www.woods-mathews.com/ This test is not yet approved or cleared by the [place] FDA and  has been authorized for detection and/or diagnosis of SARS-CoV-2 by FDA under an Emergency Use Authorization (EUA). This EUA will remain  in effect (meaning this test can be used) for the duration of the COVID-19 declaration under Section 56 4(b)(1) of the Act, 21 U.S.C. section 360bbb-3(b)(1), unless the authorization is terminated or revoked sooner. Performed at [facility], [place] [address], [place], [place] [address]   SARS CORONAVIRUS 2 (TAT 6-24 HRS) Nasopharyngeal Nasopharyngeal Swab     Status: None   Collection Time: 11/17/18  3:52 PM   Specimen: Nasopharyngeal Swab  Result Value Ref Range Status   SARS Coronavirus 2 NEGATIVE NEGATIVE Final    Comment: (NOTE) SARS-CoV-2 target nucleic acids are NOT DETECTED. The SARS-CoV-2 RNA is generally detectable in upper and lower respiratory specimens during the acute phase of infection. Negative results do not preclude SARS-CoV-2 infection, do not rule out co-infections with other pathogens, and should not be used as the sole basis for treatment or other patient management decisions. Negative results must be combined with clinical observations, patient history, and epidemiological information. The expected result is Negative. Fact Sheet for Patients: SugarRoll.be Fact  Sheet for Healthcare Providers: https://www.woods-mathews.com/ This test is not yet approved or cleared by the [place] FDA and  has been authorized for detection and/or diagnosis of SARS-CoV-2 by FDA under an Emergency Use Authorization (EUA). This EUA will remain  in effect (meaning this test can be used) for the duration of the COVID-19 declaration under Section 56 4(b)(1) of the Act, 21 U.S.C. section 360bbb-3(b)(1), unless the authorization is terminated or revoked sooner. Performed at [facility], [place] [address], [place], [place] [address]          Radiology Studies: No results found.      Scheduled Meds: . amLODipine  10 mg Oral QHS  . aspirin  81 mg Oral QPM  . atorvastatin  20 mg Oral QPM  . cholecalciferol  1,000 Units Oral BID  . enoxaparin (LOVENOX) injection  40 mg Subcutaneous Q24H  . feeding supplement (ENSURE ENLIVE)  237 mL Oral BID BM  . feeding supplement (PRO-STAT SUGAR FREE 64)  30 mL Oral BID  . ferrous gluconate  324 mg Oral Daily  . guaiFENesin  600 mg Oral BID  . methylPREDNISolone (SOLU-MEDROL) injection  40 mg Intravenous Q12H  . metoprolol succinate  100 mg Oral QHS  . sodium chloride flush  3 mL Intravenous Once   Continuous Infusions: .  ceFAZolin (ANCEF) IV 2 g (11/19/18 0827)     LOS: 5 days    Time spent: 35 mins.More than 50% of that time was spent in counseling and/or coordination of care.      Shelly Coss, MD [facility] Pager [phone]  If 7PM-7AM, please contact night-coverage [email or web address] Password [email or web address] 11/19/2018, 1:01 PM

## 2018-11-20 LAB — BASIC METABOLIC PANEL
Anion gap: 11 (ref 5–15)
BUN: 32 mg/dL — ABNORMAL HIGH (ref 8–23)
CO2: 31 mmol/L (ref 22–32)
Calcium: 8.5 mg/dL — ABNORMAL LOW (ref 8.9–10.3)
Chloride: 94 mmol/L — ABNORMAL LOW (ref 98–111)
Creatinine, Ser: 0.94 mg/dL (ref 0.61–1.24)
GFR calc Af Amer: >60 mL/min (ref >60)
GFR calc non Af Amer: >60 mL/min (ref >60)
Glucose, Bld: 145 mg/dL — ABNORMAL HIGH (ref 70–99)
Potassium: 4.3 mmol/L (ref 3.5–5.1)
Sodium: 136 mmol/L (ref 135–145)

## 2018-11-20 LAB — CBC WITH DIFFERENTIAL/PLATELET
Abs Immature Granulocytes: 0.16 10*3/uL — ABNORMAL HIGH (ref 0.00–0.07)
Basophils Absolute: 0 10*3/uL (ref 0.0–0.1)
Basophils Relative: 0 %
Eosinophils Absolute: 0 10*3/uL (ref 0.0–0.5)
Eosinophils Relative: 0 %
HCT: 35.8 % — ABNORMAL LOW (ref 39.0–52.0)
Hemoglobin: 11.4 g/dL — ABNORMAL LOW (ref 13.0–17.0)
Immature Granulocytes: 1 %
Lymphocytes Relative: 4 %
Lymphs Abs: 0.5 10*3/uL — ABNORMAL LOW (ref 0.7–4.0)
MCH: 29.8 pg (ref 26.0–34.0)
MCHC: 31.8 g/dL (ref 30.0–36.0)
MCV: 93.5 fL (ref 80.0–100.0)
Monocytes Absolute: 0.5 10*3/uL (ref 0.1–1.0)
Monocytes Relative: 4 %
Neutro Abs: 11.5 10*3/uL — ABNORMAL HIGH (ref 1.7–7.7)
Neutrophils Relative %: 91 %
Platelets: 552 10*3/uL — ABNORMAL HIGH (ref 150–400)
RBC: 3.83 MIL/uL — ABNORMAL LOW (ref 4.22–5.81)
RDW: 13.1 % (ref 11.5–15.5)
WBC: 12.6 10*3/uL — ABNORMAL HIGH (ref 4.0–10.5)
nRBC: 0 % (ref 0.0–0.2)

## 2018-11-20 MED ORDER — DOXYCYCLINE HYCLATE 100 MG PO TABS
100.0000 mg | ORAL_TABLET | Freq: Two times a day (BID) | ORAL | Status: DC
  Administered 2018-11-20 – 2018-11-21 (×4): 100 mg via ORAL
  Filled 2018-11-20 (×5): qty 1

## 2018-11-20 MED ORDER — PREDNISONE 20 MG PO TABS
40.0000 mg | ORAL_TABLET | Freq: Every day | ORAL | Status: DC
  Administered 2018-11-20 – 2018-11-22 (×3): 40 mg via ORAL
  Filled 2018-11-20 (×4): qty 2

## 2018-11-20 MED ORDER — GUAIFENESIN-DM 100-10 MG/5ML PO SYRP
10.0000 mL | ORAL_SOLUTION | Freq: Four times a day (QID) | ORAL | Status: DC
  Administered 2018-11-20 – 2018-11-22 (×3): 10 mL via ORAL
  Filled 2018-11-20 (×5): qty 10

## 2018-11-20 MED ORDER — BUDESONIDE 0.25 MG/2ML IN SUSP
0.2500 mg | Freq: Two times a day (BID) | RESPIRATORY_TRACT | Status: DC
  Administered 2018-11-20 – 2018-11-22 (×4): 0.25 mg via RESPIRATORY_TRACT
  Filled 2018-11-20 (×4): qty 2

## 2018-11-20 MED ORDER — SODIUM CHLORIDE 0.9 % IV SOLN
1.0000 g | INTRAVENOUS | Status: DC
  Administered 2018-11-20 – 2018-11-22 (×3): 1 g via INTRAVENOUS
  Filled 2018-11-20 (×3): qty 1

## 2018-11-20 NOTE — TOC Progression Note (Addendum)
Transition of Care [facility]) - Progression Note    Patient Details  Name: [name] MRN: [id number] Date of Birth: [birth year]  Transition [facility]) CM/SW [place], [place] Phone Number: 11/20/2018, 1:34 PM  Clinical Narrative:     CSW met with the patient and his wife at beside. CSW asked what facilities they would be interested in for SNF. Wife had some concerns but the decided between [facility] as their first choice and [facility] as their second.   CSW answered all questions. CSW obtained permission to fax the patient out. CSW stated she would be in contact with them once they have bed offers.   CSW called both [facility] and [facility] and left voicemail's for admission directors to review and make a determination in the hub.   CSW will continue to follow and assist with disposition planning.   Expected Discharge Plan: [place] Barriers to Discharge: Continued Medical Work up  Expected Discharge Plan and Services Expected Discharge Plan: [place] Acute Care Choice: IP Rehab Living arrangements for the past 2 months: Single Family Home                                       Social Determinants of Health (SDOH) Interventions    Readmission Risk Interventions No flowsheet data found.

## 2018-11-20 NOTE — TOC Progression Note (Signed)
Transition of Care [facility]) - Progression Note    Patient Details  Name: [name] MRN: [id number] Date of Birth: April 07, 1935  Transition of Care [facility]) CM/SW [place], [place] Phone Number: 11/20/2018, 8:30 AM  Clinical Narrative:     CSW called and spoke with the patient's wife about discharge to SNF. She stated that she would be coming up to the hospital today around 11. She asked if the CSW could meet her at bedside, CSW agreed and said she would stop by.   CSW will continue to follow and assist with disposition planning.   Expected Discharge Plan: [place] Barriers to Discharge: Continued Medical Work up  Expected Discharge Plan and Services Expected Discharge Plan: [place] Acute Care Choice: IP Rehab Living arrangements for the past 2 months: Single Family Home                                       Social Determinants of Health (SDOH) Interventions    Readmission Risk Interventions No flowsheet data found.

## 2018-11-20 NOTE — Progress Notes (Signed)
PROGRESS NOTE    [name]  [id number] DOB: 10-23-1935 DOA: 11/12/2018 PCP: Renne Crigler, NP   Brief Narrative: Patient is 83 year old male with history of coronary disease, colon cancer, bilateral lower extremity lymphedema, venous stasis ulcer who was admitted here for the management of bilateral lower extremity cellulitis.  He was started on broad-spectrum antibiotics.  Orthopedics was following and recommended compression stockings.  PT/OT evaluated him and recommended CIR.Cir recommending SNF now.SW consulted.  Patient is hemodynamically stable for discharge to skilled nursing facility as soon as the bed is available.  Will change antibiotics to oral on discharge.  Assessment & Plan:   Principal Problem:   Bilateral leg weakness Active Problems:   HTN (hypertension)   Venous stasis ulcer (HCC)   Weakness of both lower extremities   Abnormal MRI   Bacteria in urine   Transaminitis   Bilateral lower extremity cellulitis/ulcer: Has history of venous insufficiency, lymphedema of bilateral lower extremities.  Presented with cellulitis along with petechiae, purpuric blisters.  This is been ongoing for last 5 years.  Did not respond to oral antibiotics that was started as an outpatient.  Also has mild  leukocytosis.  Cultures negative so far.  He was initially  started on Zosyn,changed to cefazoline but now on ceftriaxone because he also has possible RLL PNA. Orthopedics recommend: changing out socks daily and using second layer of compression during the daytime as noted. The single layer against the wound should be worn 24 hrs a day except for showering. Ortho recommended follow up in the office in 1 week following discharge. As per the RN/patient, the ulcerations on the right lower extremity are still very bad.  Patient is uncomfortable with the socks.  I have requested Dr. Sharol Given for follow-up.  Acute respiratory with hypoxia: New problem.  He was found to have bilateral expiratory  wheezes .  He desaturated on ambulation. He was a previous [name].  He might have underlying COPD.  Started on steroids with improvement on wheezes but he is still hypoxic needs oxygen supplementation .Chest Xray showed mild vascular congestion/interstitial edema.  He was given 2 doses of Lasix.   ICT angios ruled out PE but showed possible right lower lobe pneumonia/atelectasis.  Started on ceftriaxone and doxycycline.  Echocardiogram showed ejection fraction of 50 to 55%.  Abnormal MRI/bilateral lower extremity weakness: MRI showed subacute L4 fracture, neural foraminal stenosis, large osteophyte at L5-S1.  Patient does not complain of any back pain today.  He was seen by physical therapy and recommended CIR/SNF  Asymptomatic bacteriuria: Denies any dysuria.  Urine culture did not show any significant growth  Hypertension: Blood pressure currently stable.  ACE inhibitor and diuretics on hold due to bump in creatinine.  Continue Norvasc and Toprol.BP stable  Transaminitis: LFT trended down.  Continue to monitor.  Patient currently asymptomatic.  Elevated liver enzymes most likely associated with elevated CK level.  Elevated CK: Started on IV fluids which has been stopped because patient developed wheezing.CK has improved.   Nutrition Problem: Increased nutrient needs Etiology: wound healing      DVT prophylaxis: Lovenox Code Status: Full Family Communication: Will discuss with wife at bed side Disposition Plan: SnF as soon as the bed is available   Consultants: Ortho  Procedures:None  Antimicrobials:  Anti-infectives (From admission, onward)   Start     Dose/Rate Route Frequency Ordered Stop   11/20/18 1145  cefTRIAXone (ROCEPHIN) 1 g in sodium chloride 0.9 % 100 mL IVPB  1 g 200 mL/hr over 30 Minutes Intravenous Every 24 hours 11/20/18 1137     11/20/18 1145  doxycycline (VIBRA-TABS) tablet 100 mg     100 mg Oral Every 12 hours 11/20/18 1137     11/19/18 1815  levofloxacin  (LEVAQUIN) tablet 750 mg  Status:  Discontinued     750 mg Oral Daily 11/19/18 1742 11/20/18 1136   11/19/18 1745  levofloxacin (LEVAQUIN) tablet 500 mg  Status:  Discontinued     500 mg Oral Daily 11/19/18 1742 11/19/18 1742   11/16/18 1600  ceFAZolin (ANCEF) IVPB 2g/100 mL premix  Status:  Discontinued     2 g 200 mL/hr over 30 Minutes Intravenous Every 8 hours 11/16/18 1323 11/20/18 1136   11/16/18 0300  vancomycin (VANCOCIN) 1,500 mg in sodium chloride 0.9 % 500 mL IVPB  Status:  Discontinued     1,500 mg 250 mL/hr over 120 Minutes Intravenous Every 24 hours 11/15/18 1305 11/15/18 1312   11/14/18 0300  vancomycin (VANCOCIN) 2,500 mg in sodium chloride 0.9 % 500 mL IVPB  Status:  Discontinued     2,500 mg 250 mL/hr over 120 Minutes Intravenous Every 24 hours 11/13/18 0223 11/15/18 1305   11/13/18 1000  piperacillin-tazobactam (ZOSYN) IVPB 3.375 g  Status:  Discontinued     3.375 g 12.5 mL/hr over 240 Minutes Intravenous Every 8 hours 11/13/18 0223 11/16/18 1310   11/13/18 0200  vancomycin (VANCOCIN) IVPB 1000 mg/200 mL premix  Status:  Discontinued     1,000 mg 200 mL/hr over 60 Minutes Intravenous  Once 11/13/18 0154 11/13/18 0156   11/13/18 0200  piperacillin-tazobactam (ZOSYN) IVPB 3.375 g     3.375 g 100 mL/hr over 30 Minutes Intravenous  Once 11/13/18 0154 11/13/18 0237   11/13/18 0200  vancomycin (VANCOCIN) 2,000 mg in sodium chloride 0.9 % 500 mL IVPB     2,000 mg 250 mL/hr over 120 Minutes Intravenous  Once 11/13/18 0156 11/13/18 0657      Subjective: Patient seen and examined the bedside this morning.  Hemodynamically stable.  Requiring 2 to 3 L of oxygen per minute.  Also looks slightly short of breath.  He said he is very uncomfortable with the socks on his lower extremities  Objective: Vitals:   11/19/18 2158 11/19/18 2308 11/20/18 0300 11/20/18 0758  BP: 128/68 125/69 136/75 125/65  Pulse:  62 60 66  Resp:  20 19 20   Temp:  98.4 F (36.9 C) 98.2 F (36.8 C) 98  F (36.7 C)  TempSrc:  Oral Oral Oral  SpO2:  95% 92% 92%  Weight:      Height:        Intake/Output Summary (Last 24 hours) at 11/20/2018 1137 Last data filed at 11/20/2018 0854 Gross per 24 hour  Intake 880 ml  Output 1200 ml  Net -320 ml   Filed Weights   11/16/18 0643  Weight: 112 kg    Examination:  General exam: Very pleasant, deconditioned elderly gentleman  HEENT:PERRL,Oral mucosa moist, Ear/Nose normal on gross exam Respiratory system: Decreased air entry on the right side Cardiovascular system: S1 & S2 heard, RRR. No JVD, murmurs, rubs, gallops or clicks. Gastrointestinal system: Abdomen is nondistended, soft and nontender. No organomegaly or masses felt. Normal bowel sounds heard. Central nervous system: Alert and oriented. No focal neurological deficits Extremities: Bilateral lower extremity wrapped with compression socks. Picture taken few days ago:     Data Reviewed: I have personally reviewed following labs and imaging studies  CBC: Recent Labs  Lab 11/14/18 0804 11/15/18 0339 11/16/18 0349 11/17/18 0346 11/20/18 0753  WBC 11.7* 11.0* 18.5* 13.1* 12.6*  NEUTROABS 9.5* 9.8*  --  12.6* 11.5*  HGB 10.8* 10.7* 10.7* 10.4* 11.4*  HCT 33.2* 33.7* 34.2* 32.9* 35.8*  MCV 92.0 93.9 96.1 94.3 93.5  PLT 401* 404* 409* 412* [id number]*   Basic Metabolic Panel: Recent Labs  Lab 11/14/18 0804 11/15/18 0339 11/16/18 0349 11/18/18 0429 11/20/18 0753  NA 140 140 141 140 136  K 4.1 4.0 4.1 4.2 4.3  CL 105 99 102 95* 94*  CO2 27 30 30  33* 31  GLUCOSE 106* 162* 111* 160* 145*  BUN 23 32* 33* 37* 32*  CREATININE 1.01 1.30* 1.16 1.18 0.94  CALCIUM 8.3* 8.3* 8.5* 8.7* 8.5*  MG 1.8  --   --   --   --    GFR: Estimated Creatinine Clearance: 84.5 mL/min (by C-G formula based on SCr of 0.94 mg/dL). Liver Function Tests: Recent Labs  Lab 11/15/18 0339 11/16/18 0349  AST 91* 58*  ALT 73* 64*  ALKPHOS 57 55  BILITOT 0.3 0.6  PROT 6.2* 6.6  ALBUMIN 2.1* 2.0*    No results for input(s): LIPASE, AMYLASE in the last 168 hours. No results for input(s): AMMONIA in the last 168 hours. Coagulation Profile: No results for input(s): INR, PROTIME in the last 168 hours. Cardiac Enzymes: Recent Labs  Lab 11/14/18 0804  CKTOTAL 1,149*   BNP (last 3 results) No results for input(s): PROBNP in the last 8760 hours. HbA1C: No results for input(s): HGBA1C in the last 72 hours. CBG: No results for input(s): GLUCAP in the last 168 hours. Lipid Profile: No results for input(s): CHOL, HDL, LDLCALC, TRIG, CHOLHDL, LDLDIRECT in the last 72 hours. Thyroid Function Tests: No results for input(s): TSH, T4TOTAL, FREET4, T3FREE, THYROIDAB in the last 72 hours. Anemia Panel: No results for input(s): VITAMINB12, FOLATE, FERRITIN, TIBC, IRON, RETICCTPCT in the last 72 hours. Sepsis Labs: No results for input(s): PROCALCITON, LATICACIDVEN in the last 168 hours.  Recent Results (from the past 240 hour(s))  Blood culture (routine x 2)     Status: None   Collection Time: 11/13/18 12:30 AM   Specimen: BLOOD RIGHT HAND  Result Value Ref Range Status   Specimen Description BLOOD RIGHT HAND  Final   Special Requests   Final    BOTTLES DRAWN AEROBIC AND ANAEROBIC Blood Culture results may not be optimal due to an inadequate volume of blood received in culture bottles   Culture   Final    NO GROWTH 5 DAYS Performed at [facility], [place] [address] [place], [place] [address]    Report Status 11/18/2018 FINAL  Final  Blood culture (routine x 2)     Status: None   Collection Time: 11/13/18 12:50 AM   Specimen: BLOOD RIGHT WRIST  Result Value Ref Range Status   Specimen Description BLOOD RIGHT WRIST  Final   Special Requests   Final    BOTTLES DRAWN AEROBIC AND ANAEROBIC Blood Culture adequate volume   Culture   Final    NO GROWTH 5 DAYS Performed at [facility], [place] [address] [place], [place] [address]    Report Status 11/18/2018 FINAL  Final  Urine  Culture     Status: Abnormal   Collection Time: 11/13/18 12:50 AM   Specimen: Urine, Random  Result Value Ref Range Status   Specimen Description URINE, RANDOM  Final   Special  Requests NONE  Final   Culture (A)  Final    <10,000 COLONIES/mL INSIGNIFICANT GROWTH Performed at [facility], [place] [address], [place], [place] [address]    Report Status 11/14/2018 FINAL  Final  SARS CORONAVIRUS 2 (TAT 6-24 HRS) Nasopharyngeal Nasopharyngeal Swab     Status: None   Collection Time: 11/13/18  2:28 AM   Specimen: Nasopharyngeal Swab  Result Value Ref Range Status   SARS Coronavirus 2 NEGATIVE NEGATIVE Final    Comment: (NOTE) SARS-CoV-2 target nucleic acids are NOT DETECTED. The SARS-CoV-2 RNA is generally detectable in upper and lower respiratory specimens during the acute phase of infection. Negative results do not preclude SARS-CoV-2 infection, do not rule out co-infections with other pathogens, and should not be used as the sole basis for treatment or other patient management decisions. Negative results must be combined with clinical observations, patient history, and epidemiological information. The expected result is Negative. Fact Sheet for Patients: SugarRoll.be Fact Sheet for Healthcare Providers: https://www.woods-mathews.com/ This test is not yet approved or cleared by the [place] FDA and  has been authorized for detection and/or diagnosis of SARS-CoV-2 by FDA under an Emergency Use Authorization (EUA). This EUA will remain  in effect (meaning this test can be used) for the duration of the COVID-19 declaration under Section 56 4(b)(1) of the Act, 21 U.S.C. section 360bbb-3(b)(1), unless the authorization is terminated or revoked sooner. Performed at [facility], [place] [address], [place], [place] [address]   SARS CORONAVIRUS 2 (TAT 6-24 HRS) Nasopharyngeal Nasopharyngeal Swab     Status: None   Collection Time:  11/17/18  3:52 PM   Specimen: Nasopharyngeal Swab  Result Value Ref Range Status   SARS Coronavirus 2 NEGATIVE NEGATIVE Final    Comment: (NOTE) SARS-CoV-2 target nucleic acids are NOT DETECTED. The SARS-CoV-2 RNA is generally detectable in upper and lower respiratory specimens during the acute phase of infection. Negative results do not preclude SARS-CoV-2 infection, do not rule out co-infections with other pathogens, and should not be used as the sole basis for treatment or other patient management decisions. Negative results must be combined with clinical observations, patient history, and epidemiological information. The expected result is Negative. Fact Sheet for Patients: SugarRoll.be Fact Sheet for Healthcare Providers: https://www.woods-mathews.com/ This test is not yet approved or cleared by the [place] FDA and  has been authorized for detection and/or diagnosis of SARS-CoV-2 by FDA under an Emergency Use Authorization (EUA). This EUA will remain  in effect (meaning this test can be used) for the duration of the COVID-19 declaration under Section 56 4(b)(1) of the Act, 21 U.S.C. section 360bbb-3(b)(1), unless the authorization is terminated or revoked sooner. Performed at [facility], [place] [address] [place], [place] [address]          Radiology Studies: Ct Angio Chest Pe W Or Wo Contrast  Result Date: 11/19/2018 CLINICAL DATA:  Dyspnea EXAM: CT ANGIOGRAPHY CHEST WITH CONTRAST TECHNIQUE: Multidetector CT imaging of the chest was performed using the standard protocol during bolus administration of intravenous contrast. Multiplanar CT image reconstructions and MIPs were obtained to evaluate the vascular anatomy. CONTRAST:  86mL OMNIPAQUE IOHEXOL 350 MG/ML SOLN COMPARISON:  04/09/2017 and previous FINDINGS: Cardiovascular: Heart size normal. No pericardial effusion. Dilated central pulmonary arteries. Satisfactory  opacification of pulmonary arteries noted, and there is no evidence of pulmonary emboli. Moderate coronary calcifications. Adequate contrast opacification of the thoracic aorta with no evidence of dissection, aneurysm, or stenosis. There is  classic 3-vessel brachiocephalic arch anatomy without proximal stenosis. Scattered atheromatous plaque in the arch. Mediastinum/Nodes: No hilar or mediastinal adenopathy. Lungs/Pleura: Small bilateral pleural effusions. Dependent atelectasis/consolidation posteriorly in lower lobes, right greater than left. 1.6 cm sub-solid nodule in the anterior right upper lobe image 57/6, new since 10/18/2009. Patchy somewhat nodular airspace opacities laterally in the right lower lobe image 82-83/6, and posteriorly. Upper Abdomen: No acute findings. Musculoskeletal: Anterior vertebral bridging osteophytes at multiple levels in the mid and lower thoracic spine. No fracture or worrisome bone lesion. Review of the MIP images confirms the above findings. IMPRESSION: 1. Negative for acute PE or thoracic aortic dissection. 2. Patchy airspace disease in the right lower lobe. 3. 1.6 cm right upper lobe subsolid nodule, possibly infectious/inflammatory. Initial follow-up with CT at 6-12 months is recommended to confirm resolution. If persistent, repeat CT is recommended every 2 years until 5 years of stability has been established. This recommendation follows the consensus statement: Guidelines for Management of Incidental Pulmonary Nodules Detected on CT Images:From the Fleischner Society 2017; published online before print (10.1148/radiol.IJ:2314499). 4. Coronary and Aortic Atherosclerosis (ICD10-I70.0). Electronically Signed   By: Lucrezia Europe M.D.   On: 11/19/2018 15:05        Scheduled Meds: . amLODipine  10 mg Oral QHS  . aspirin  81 mg Oral QPM  . atorvastatin  20 mg Oral QPM  . cholecalciferol  1,000 Units Oral BID  . doxycycline  100 mg Oral Q12H  . enoxaparin (LOVENOX) injection   40 mg Subcutaneous Q24H  . feeding supplement (ENSURE ENLIVE)  237 mL Oral BID BM  . feeding supplement (PRO-STAT SUGAR FREE 64)  30 mL Oral BID  . ferrous gluconate  324 mg Oral Daily  . guaiFENesin  600 mg Oral BID  . metoprolol succinate  100 mg Oral QHS  . [START ON 11/21/2018] predniSONE  40 mg Oral Q breakfast  . sodium chloride flush  3 mL Intravenous Once   Continuous Infusions: . cefTRIAXone (ROCEPHIN)  IV       LOS: 6 days    Time spent: 35 mins.More than 50% of that time was spent in counseling and/or coordination of care.      Shelly Coss, MD [facility] Pager [phone]  If 7PM-7AM, please contact night-coverage [email or web address] Password [facility] 11/20/2018, 11:37 AM

## 2018-11-20 NOTE — Progress Notes (Signed)
Physical Therapy Treatment Patient Details Name: [name] MRN: [id number] DOB: [birth year] Today's Date: 11/20/2018    History of Present Illness [name] is a 83 y.o. male with medical history significant of CAD, colon Ca, BLE lymph edema and venous stasis ulcers.Patient was hospitalized at [facility] x2 days ago for 1 day after worsening wounds to leg.  Sent home with script for bactrim for UTI which he didn't fill due to sulfa allergy.After going home patient has been unable to stand due to weakness in his legs and unable to walk for the past one day or so.  Has chronic low back pain which is unchanged.    PT Comments    Pt agreeable to walking with therapy however reports increased B LE pain due to weeping and bleeding crusting on his compression hose. In addition to pain pt is limited in safe mobility by decrease strength and endurance as well as 3/4 DoE with ambulation. Pt requires modA for bed mobility, maxAx2 for transfers and modAx2 for ambulation of 4 feet with RW. CIR refused admission based on pt's deconditioning, PT now recommending SNF level rehab. PT will continue to follow acutely.   Follow Up Recommendations  SNF     Equipment Recommendations  None recommended by PT    Recommendations for Other Services Rehab consult     Precautions / Restrictions Precautions Precautions: Fall Precaution Comments: monitor drainage from compression garments Restrictions Weight Bearing Restrictions: No    Mobility  Bed Mobility Overal bed mobility: Needs Assistance Bed Mobility: Supine to Sit;Sit to Supine     Supine to sit: Mod assist     General bed mobility comments: pt able to come into longsitting and uses B UE to move LE, requires modA for trunk support as he moves LE to EoB, pt then requires modA for pad scoot of hips to EoB  Transfers Overall transfer level: Needs assistance Equipment used: Rolling walker (2 wheeled) Transfers: Sit to/from Stand Sit to Stand:  From elevated surface;Max assist;+2 physical assistance         General transfer comment: maxAx2 for power up to RW, requires assist for bringing L UE from bed surface to RW, once in upright requires maxA for steadying  Ambulation/Gait Ambulation/Gait assistance: Mod assist;+2 physical assistance;+2 safety/equipment Gait Distance (Feet): 5 Feet Assistive device: Rolling walker (2 wheeled);1 person hand held assist Gait Pattern/deviations: Wide base of support;Trunk flexed;Decreased weight shift to left;Decreased step length - right;Decreased step length - left;Shuffle;Step-to pattern Gait velocity: reduced Gait velocity interpretation: <1.31 ft/sec, indicative of household ambulator General Gait Details: pt with decreased strength today requiring modA at hip for steadying with slow gait, vc for upright posture and sequencing of LE         Balance Overall balance assessment: History of Falls;Needs assistance Sitting-balance support: Feet supported;Bilateral upper extremity supported Sitting balance-Leahy Scale: Fair     Standing balance support: Bilateral upper extremity supported;During functional activity Standing balance-Leahy Scale: Poor Standing balance comment: poor but with cues can get more upright                            Cognition Arousal/Alertness: Awake/alert Behavior During Therapy: WFL for tasks assessed/performed Overall Cognitive Status: Within Functional Limits for tasks assessed  General Comments General comments (skin integrity, edema, etc.): Pt with bilateral compression stockings on with increased weeping and crusting through, pt reports they have not been changed in 2 days. SaO2 on 3L O2 via [place] throughout session >92% O2 at rest with ambulation poor pleth waveform due to increased UE support on RW, however SaO2 >88%O2 with ambulation, max HR with ambulation 112 bpm      Pertinent  Vitals/Pain Pain Assessment: Faces Faces Pain Scale: Hurts even more Pain Location: B lower legs Pain Descriptors / Indicators: Grimacing Pain Intervention(s): Limited activity within patient's tolerance;Monitored during session;Repositioned    Home Living                      Prior Function            PT Goals (current goals can now be found in the care plan section) Acute Rehab PT Goals Patient Stated Goal: to get stronger and get home PT Goal Formulation: With patient/family Time For Goal Achievement: 11/28/18 Potential to Achieve Goals: Good Progress towards PT goals: Not progressing toward goals - comment(pt with decreased strength and endurance today with gait)    Frequency    Min 3X/week      PT Plan Discharge plan needs to be updated(CIR refusal )       AM-PAC PT "6 Clicks" Mobility   Outcome Measure  Help needed turning from your back to your side while in a flat bed without using bedrails?: A Lot Help needed moving from lying on your back to sitting on the side of a flat bed without using bedrails?: A Lot Help needed moving to and from a bed to a chair (including a wheelchair)?: A Lot Help needed standing up from a chair using your arms (e.g., wheelchair or bedside chair)?: A Lot Help needed to walk in hospital room?: A Little Help needed climbing 3-5 steps with a railing? : Total 6 Click Score: 12    End of Session Equipment Utilized During Treatment: Gait belt;Oxygen Activity Tolerance: Patient tolerated treatment well;Patient limited by fatigue;Treatment limited secondary to medical complications (Comment) Patient left: with call bell/phone within reach;with family/visitor present;in chair Nurse Communication: Mobility status PT Visit Diagnosis: Unsteadiness on feet (R26.81);Muscle weakness (generalized) (M62.81);Difficulty in walking, not elsewhere classified (R26.2)     Time: 1017-1040 PT Time Calculation (min) (ACUTE ONLY): 23  min  Charges:  $Gait Training: 8-22 mins $Therapeutic Activity: 8-22 mins                     Kamalei Roeder B. Migdalia Dk PT, DPT Acute Rehabilitation Services Pager [phone] Office [phone]    [place] 11/20/2018, 11:16 AM

## 2018-11-21 DIAGNOSIS — J9601 Acute respiratory failure with hypoxia: Secondary | ICD-10-CM

## 2018-11-21 DIAGNOSIS — L03119 Cellulitis of unspecified part of limb: Secondary | ICD-10-CM

## 2018-11-21 LAB — GLUCOSE, CAPILLARY: Glucose-Capillary: 131 mg/dL — ABNORMAL HIGH (ref 70–99)

## 2018-11-21 NOTE — TOC Progression Note (Signed)
Transition of Care [facility]) - Progression Note    Patient Details  Name: [name] MRN: [id number] Date of Birth: 1935/03/15  [facility]) CM/SW [place], [place] Phone Number: 11/21/2018, 4:56 PM  Clinical Narrative:   CSW reached out to [facility], and they do not have any beds available. CSW also reached out to [facility], and left two voicemails today requesting a call back about patient's referral. CSW received a call back from the social worker that the Admissions person had left early for a doctors appointment and would return CSW call tomorrow. They have not yet reviewed referral. CSW informed Admissions that patient is ready for discharge. CSW to follow.    Expected Discharge Plan: [place] Barriers to Discharge: Continued Medical Work up  Expected Discharge Plan and Services Expected Discharge Plan: [place] Acute Care Choice: IP Rehab Living arrangements for the past 2 months: Single Family Home                                       Social Determinants of Health (SDOH) Interventions    Readmission Risk Interventions No flowsheet data found.

## 2018-11-21 NOTE — Progress Notes (Signed)
Daily Nursing Note  Received report from [place], Therapist, sports. Patient is stable, in NAD. Provided morning medications. Gotten OOB with 1P assistance in afternoon. Dressings on LE changed. Patients wife updated at bedside. COVID swab sent. Plan for discharge tomorrow. All patient needs met throughout the day.

## 2018-11-21 NOTE — Progress Notes (Signed)
Patient ID: [name], male   DOB: August 11, 1935, 83 y.o.   MRN: [id number] Both legs were evaluated status post initiation of treatment with compression socks for venous insufficiency with vascular inflammation with petechial rashes and ulcers on both legs.  Examination of both legs the swelling is decreasing the vasculitis is improving nicely the wounds are healing over there is a thin scab over most of the wounds.  There is still drainage still some swelling.  Recommend patient continue with the compression sock treatment.  Patient states that he is going to [facility].  Patient should continue with the current care with wearing the sock around-the-clock the sleeve on top of the sock during waking hours and change this daily.  It is okay for patient to wet the sock to remove it.

## 2018-11-21 NOTE — Progress Notes (Signed)
Patient ID: [name], male   DOB: 14-Sep-1935, 83 y.o.   MRN: [id number]  PROGRESS NOTE    [name]  [id number] DOB: 05/31/1935 DOA: 11/12/2018 PCP: Renne Crigler, NP   Brief Narrative:  83 year old male with history of coronary artery disease, colon cancer, bilateral lower extremity lymphedema, venous stasis ulcer was admitted on 11/12/2018 for the management of bilateral lower extremity cellulitis.  He was started on broad-spectrum antibiotics.  Orthopedics recommended compression stockings.  PT recommended CIR.  CIR recommended SNF.  Currently on intravenous antibiotics which will be switched to oral antibiotics on discharge.  Hemodynamically and medically stable for discharge.  Assessment & Plan:   Bilateral lower extremity cellulitis/ulcer Chronic bilateral lower extremity venous insufficiency/lymphedema -Presented with cellulitis along with petechiae, purpuric distress.  This is ongoing for the last 5 years.  Did not respond to outpatient oral antibiotics -Cultures negative so far -Started on Zosyn which was switched to Ancef but subsequently switched to Rocephin because there was a possibility of right lower lobe pneumonia -We will switch to oral antibiotics on discharge -Orthopedics evaluated the patient today and recommended to continue with compression sock treatment: Patient should continue with the current care with wearing the sock around-the-clock the sleeve on top of the sock during waking hours and change this daily.  It is okay for patient to wet the sock to remove it. -Outpatient follow-up with orthopedics and might need wound care follow-up.   Acute hypoxic respiratory failure: New problem -He was a previous smoker.  He might have underlying COPD -Started on steroids with improvement of wheezing.  Continue nebs. -Oxygen requirement improving: Now on 3 L oxygen -Patient was given 2 doses of Lasix as well. -CT angiogram of the chest rule out PE but showed possible  right lower lobe pneumonia/atelectasis -Currently on Rocephin and doxycycline which will be continued. -Echo showed EF of 50 to 55%.  Abnormal MRI/bilateral lower extremity weakness -MRI showed subacute L4 fracture, neural foraminal stenosis, large osteophyte at L5-S1.  Patient does not complain of any back pain. -PT recommended CIR/SNF.  CIR recommended SNF.  Social worker following. -Medically stable for discharge.  Leukocytosis -Improving.  Monitor  Acute transaminitis -LFTs trended down.  Monitor intermittently.  Asymptomatic bacteriuria -Denies any dysuria.  Urine culture did not show any significant growth.  Hypertension -Blood pressure stable.  Continue amlodipine and metoprolol.  ACE inhibitor and diuretics on hold due to bump in creatinine  Generalized deconditioning -Continue PT     DVT prophylaxis: Lovenox Code Status: Full Family Communication: None at bedside Disposition Plan: SNF once bed is available  Consultants: Orthopedics  Procedures:  Echo IMPRESSIONS    1. Left ventricular ejection fraction, by visual estimation, is 50 to 55%. The left ventricle has normal function. Mildly increased left ventricular size. There is no left ventricular hypertrophy.  2. Left ventricular diastolic Doppler parameters are consistent with pseudonormalization pattern of LV diastolic filling.  3. Global right ventricle has normal systolic function.The right ventricular size is moderately enlarged.  4. Left atrial size was normal.  5. Right atrial size was moderately dilated.  6. Mild mitral annular calcification.  7. The mitral valve is normal in structure. No evidence of mitral valve regurgitation. No evidence of mitral stenosis.  8. The tricuspid valve is normal in structure. Tricuspid valve regurgitation is mild.  9. The aortic valve was not well visualized Aortic valve regurgitation was not visualized by color flow Doppler. 10. The pulmonic valve was not well  visualized. Pulmonic valve regurgitation was not assessed by color flow Doppler. 11. Mildly elevated pulmonary artery systolic pressure. 12. The inferior vena cava is dilated in size with >50% respiratory variability, suggesting right atrial pressure of 8 mmHg. 13. Technically difficult; probable low normal LV function; grade 2 diastolic dysfunction; mild LVE; right heart not well visualized but there appears to be moderate RAE and RVE.  Antimicrobials:  Anti-infectives (From admission, onward)   Start     Dose/Rate Route Frequency Ordered Stop   11/20/18 1145  cefTRIAXone (ROCEPHIN) 1 g in sodium chloride 0.9 % 100 mL IVPB     1 g 200 mL/hr over 30 Minutes Intravenous Every 24 hours 11/20/18 1137     11/20/18 1145  doxycycline (VIBRA-TABS) tablet 100 mg     100 mg Oral Every 12 hours 11/20/18 1137     11/19/18 1815  levofloxacin (LEVAQUIN) tablet 750 mg  Status:  Discontinued     750 mg Oral Daily 11/19/18 1742 11/20/18 1136   11/19/18 1745  levofloxacin (LEVAQUIN) tablet 500 mg  Status:  Discontinued     500 mg Oral Daily 11/19/18 1742 11/19/18 1742   11/16/18 1600  ceFAZolin (ANCEF) IVPB 2g/100 mL premix  Status:  Discontinued     2 g 200 mL/hr over 30 Minutes Intravenous Every 8 hours 11/16/18 1323 11/20/18 1136   11/16/18 0300  vancomycin (VANCOCIN) 1,500 mg in sodium chloride 0.9 % 500 mL IVPB  Status:  Discontinued     1,500 mg 250 mL/hr over 120 Minutes Intravenous Every 24 hours 11/15/18 1305 11/15/18 1312   11/14/18 0300  vancomycin (VANCOCIN) 2,500 mg in sodium chloride 0.9 % 500 mL IVPB  Status:  Discontinued     2,500 mg 250 mL/hr over 120 Minutes Intravenous Every 24 hours 11/13/18 0223 11/15/18 1305   11/13/18 1000  piperacillin-tazobactam (ZOSYN) IVPB 3.375 g  Status:  Discontinued     3.375 g 12.5 mL/hr over 240 Minutes Intravenous Every 8 hours 11/13/18 0223 11/16/18 1310   11/13/18 0200  vancomycin (VANCOCIN) IVPB 1000 mg/200 mL premix  Status:  Discontinued      1,000 mg 200 mL/hr over 60 Minutes Intravenous  Once 11/13/18 0154 11/13/18 0156   11/13/18 0200  piperacillin-tazobactam (ZOSYN) IVPB 3.375 g     3.375 g 100 mL/hr over 30 Minutes Intravenous  Once 11/13/18 0154 11/13/18 0237   11/13/18 0200  vancomycin (VANCOCIN) 2,000 mg in sodium chloride 0.9 % 500 mL IVPB     2,000 mg 250 mL/hr over 120 Minutes Intravenous  Once 11/13/18 0156 11/13/18 0657       Subjective: Patient seen and examined at rest.  He denies any worsening overnight fever, nausea or vomiting.  Feels slightly better.  Still feels weak.  Objective: Vitals:   11/21/18 0000 11/21/18 0353 11/21/18 0823 11/21/18 0835  BP: 125/66 131/63    Pulse: (!) 51 (!) 52    Resp: 20 15    Temp:  98.2 F (36.8 C)  97.7 F (36.5 C)  TempSrc:  Oral  Oral  SpO2: 93% 92% 92%   Weight:      Height:        Intake/Output Summary (Last 24 hours) at 11/21/2018 1025 Last data filed at 11/21/2018 0500 Gross per 24 hour  Intake 100 ml  Output 800 ml  Net -700 ml   Filed Weights   11/16/18 0643  Weight: 112 kg    Examination:  General exam: Appears calm and comfortable.  Elderly  male lying in bed. Respiratory system: Bilateral decreased breath sounds at bases with some scattered crackles.  No wheezing Cardiovascular system: S1 & S2 heard, mild bradycardia intermittently Gastrointestinal system: Abdomen is nondistended, soft and nontender. Normal bowel sounds heard. Extremities: No cyanosis, clubbing; bilateral lower extremity compression socks present  Data Reviewed: I have personally reviewed following labs and imaging studies  CBC: Recent Labs  Lab 11/15/18 0339 11/16/18 0349 11/17/18 0346 11/20/18 0753  WBC 11.0* 18.5* 13.1* 12.6*  NEUTROABS 9.8*  --  12.6* 11.5*  HGB 10.7* 10.7* 10.4* 11.4*  HCT 33.7* 34.2* 32.9* 35.8*  MCV 93.9 96.1 94.3 93.5  PLT 404* 409* 412* [id number]*   Basic Metabolic Panel: Recent Labs  Lab 11/15/18 0339 11/16/18 0349 11/18/18 0429  11/20/18 0753  NA 140 141 140 136  K 4.0 4.1 4.2 4.3  CL 99 102 95* 94*  CO2 30 30 33* 31  GLUCOSE 162* 111* 160* 145*  BUN 32* 33* 37* 32*  CREATININE 1.30* 1.16 1.18 0.94  CALCIUM 8.3* 8.5* 8.7* 8.5*   GFR: Estimated Creatinine Clearance: 84.5 mL/min (by C-G formula based on SCr of 0.94 mg/dL). Liver Function Tests: Recent Labs  Lab 11/15/18 0339 11/16/18 0349  AST 91* 58*  ALT 73* 64*  ALKPHOS 57 55  BILITOT 0.3 0.6  PROT 6.2* 6.6  ALBUMIN 2.1* 2.0*   No results for input(s): LIPASE, AMYLASE in the last 168 hours. No results for input(s): AMMONIA in the last 168 hours. Coagulation Profile: No results for input(s): INR, PROTIME in the last 168 hours. Cardiac Enzymes: No results for input(s): CKTOTAL, CKMB, CKMBINDEX, TROPONINI in the last 168 hours. BNP (last 3 results) No results for input(s): PROBNP in the last 8760 hours. HbA1C: No results for input(s): HGBA1C in the last 72 hours. CBG: No results for input(s): GLUCAP in the last 168 hours. Lipid Profile: No results for input(s): CHOL, HDL, LDLCALC, TRIG, CHOLHDL, LDLDIRECT in the last 72 hours. Thyroid Function Tests: No results for input(s): TSH, T4TOTAL, FREET4, T3FREE, THYROIDAB in the last 72 hours. Anemia Panel: No results for input(s): VITAMINB12, FOLATE, FERRITIN, TIBC, IRON, RETICCTPCT in the last 72 hours. Sepsis Labs: No results for input(s): PROCALCITON, LATICACIDVEN in the last 168 hours.  Recent Results (from the past 240 hour(s))  Blood culture (routine x 2)     Status: None   Collection Time: 11/13/18 12:30 AM   Specimen: BLOOD RIGHT HAND  Result Value Ref Range Status   Specimen Description BLOOD RIGHT HAND  Final   Special Requests   Final    BOTTLES DRAWN AEROBIC AND ANAEROBIC Blood Culture results may not be optimal due to an inadequate volume of blood received in culture bottles   Culture   Final    NO GROWTH 5 DAYS Performed at [facility], [place] [address] [place], [place]  [address]    Report Status 11/18/2018 FINAL  Final  Blood culture (routine x 2)     Status: None   Collection Time: 11/13/18 12:50 AM   Specimen: BLOOD RIGHT WRIST  Result Value Ref Range Status   Specimen Description BLOOD RIGHT WRIST  Final   Special Requests   Final    BOTTLES DRAWN AEROBIC AND ANAEROBIC Blood Culture adequate volume   Culture   Final    NO GROWTH 5 DAYS Performed at [facility], [place] [address], [place], [place] [address]    Report Status 11/18/2018 FINAL  Final  Urine Culture  Status: Abnormal   Collection Time: 11/13/18 12:50 AM   Specimen: Urine, Random  Result Value Ref Range Status   Specimen Description URINE, RANDOM  Final   Special Requests NONE  Final   Culture (A)  Final    <10,000 COLONIES/mL INSIGNIFICANT GROWTH Performed at [facility], [address], [place], [place] [address]    Report Status 11/14/2018 FINAL  Final  SARS CORONAVIRUS 2 (TAT 6-24 HRS) Nasopharyngeal Nasopharyngeal Swab     Status: None   Collection Time: 11/13/18  2:28 AM   Specimen: Nasopharyngeal Swab  Result Value Ref Range Status   SARS Coronavirus 2 NEGATIVE NEGATIVE Final    Comment: (NOTE) SARS-CoV-2 target nucleic acids are NOT DETECTED. The SARS-CoV-2 RNA is generally detectable in upper and lower respiratory specimens during the acute phase of infection. Negative results do not preclude SARS-CoV-2 infection, do not rule out co-infections with other pathogens, and should not be used as the sole basis for treatment or other patient management decisions. Negative results must be combined with clinical observations, patient history, and epidemiological information. The expected result is Negative. Fact Sheet for Patients: SugarRoll.be Fact Sheet for Healthcare Providers: https://www.woods-mathews.com/ This test is not yet approved or cleared by the [place] FDA and  has been authorized for detection and/or  diagnosis of SARS-CoV-2 by FDA under an Emergency Use Authorization (EUA). This EUA will remain  in effect (meaning this test can be used) for the duration of the COVID-19 declaration under Section 56 4(b)(1) of the Act, 21 U.S.C. section 360bbb-3(b)(1), unless the authorization is terminated or revoked sooner. Performed at [facility], [place] [address] [place], [place] [address]   SARS CORONAVIRUS 2 (TAT 6-24 HRS) Nasopharyngeal Nasopharyngeal Swab     Status: None   Collection Time: 11/17/18  3:52 PM   Specimen: Nasopharyngeal Swab  Result Value Ref Range Status   SARS Coronavirus 2 NEGATIVE NEGATIVE Final    Comment: (NOTE) SARS-CoV-2 target nucleic acids are NOT DETECTED. The SARS-CoV-2 RNA is generally detectable in upper and lower respiratory specimens during the acute phase of infection. Negative results do not preclude SARS-CoV-2 infection, do not rule out co-infections with other pathogens, and should not be used as the sole basis for treatment or other patient management decisions. Negative results must be combined with clinical observations, patient history, and epidemiological information. The expected result is Negative. Fact Sheet for Patients: SugarRoll.be Fact Sheet for Healthcare Providers: https://www.woods-mathews.com/ This test is not yet approved or cleared by the [place] FDA and  has been authorized for detection and/or diagnosis of SARS-CoV-2 by FDA under an Emergency Use Authorization (EUA). This EUA will remain  in effect (meaning this test can be used) for the duration of the COVID-19 declaration under Section 56 4(b)(1) of the Act, 21 U.S.C. section 360bbb-3(b)(1), unless the authorization is terminated or revoked sooner. Performed at [facility], [place] [address] [place], [place] [address]          Radiology Studies: Ct Angio Chest Pe W Or Wo Contrast  Result Date:  11/19/2018 CLINICAL DATA:  Dyspnea EXAM: CT ANGIOGRAPHY CHEST WITH CONTRAST TECHNIQUE: Multidetector CT imaging of the chest was performed using the standard protocol during bolus administration of intravenous contrast. Multiplanar CT image reconstructions and MIPs were obtained to evaluate the vascular anatomy. CONTRAST:  65mL OMNIPAQUE IOHEXOL 350 MG/ML SOLN COMPARISON:  04/09/2017 and previous FINDINGS: Cardiovascular: Heart size normal. No pericardial effusion. Dilated central pulmonary arteries. Satisfactory opacification of  pulmonary arteries noted, and there is no evidence of pulmonary emboli. Moderate coronary calcifications. Adequate contrast opacification of the thoracic aorta with no evidence of dissection, aneurysm, or stenosis. There is classic 3-vessel brachiocephalic arch anatomy without proximal stenosis. Scattered atheromatous plaque in the arch. Mediastinum/Nodes: No hilar or mediastinal adenopathy. Lungs/Pleura: Small bilateral pleural effusions. Dependent atelectasis/consolidation posteriorly in lower lobes, right greater than left. 1.6 cm sub-solid nodule in the anterior right upper lobe image 57/6, new since 10/18/2009. Patchy somewhat nodular airspace opacities laterally in the right lower lobe image 82-83/6, and posteriorly. Upper Abdomen: No acute findings. Musculoskeletal: Anterior vertebral bridging osteophytes at multiple levels in the mid and lower thoracic spine. No fracture or worrisome bone lesion. Review of the MIP images confirms the above findings. IMPRESSION: 1. Negative for acute PE or thoracic aortic dissection. 2. Patchy airspace disease in the right lower lobe. 3. 1.6 cm right upper lobe subsolid nodule, possibly infectious/inflammatory. Initial follow-up with CT at 6-12 months is recommended to confirm resolution. If persistent, repeat CT is recommended every 2 years until 5 years of stability has been established. This recommendation follows the consensus statement:  Guidelines for Management of Incidental Pulmonary Nodules Detected on CT Images:From the Fleischner Society 2017; published online before print (10.1148/radiol.SG:5268862). 4. Coronary and Aortic Atherosclerosis (ICD10-I70.0). Electronically Signed   By: Lucrezia Europe M.D.   On: 11/19/2018 15:05        Scheduled Meds:  amLODipine  10 mg Oral QHS   aspirin  81 mg Oral QPM   atorvastatin  20 mg Oral QPM   budesonide (PULMICORT) nebulizer solution  0.25 mg Nebulization BID   cholecalciferol  1,000 Units Oral BID   doxycycline  100 mg Oral Q12H   enoxaparin (LOVENOX) injection  40 mg Subcutaneous Q24H   feeding supplement (ENSURE ENLIVE)  237 mL Oral BID BM   feeding supplement (PRO-STAT SUGAR FREE 64)  30 mL Oral BID   ferrous gluconate  324 mg Oral Daily   guaiFENesin-dextromethorphan  10 mL Oral Q6H   metoprolol succinate  100 mg Oral QHS   predniSONE  40 mg Oral Q breakfast   sodium chloride flush  3 mL Intravenous Once   Continuous Infusions:  cefTRIAXone (ROCEPHIN)  IV Stopped (11/20/18 1630)          Aline August, MD [facility] 11/21/2018, 10:25 AM

## 2018-11-21 NOTE — Progress Notes (Signed)
Nutrition Follow-up  INTERVENTION:   -Ensure Enlive po BID, each supplement provides 350 kcal and 20 grams of protein -Prostat liquid protein PO 30 ml BID with meals, each supplement provides 100 kcal, 15 grams protein.  NUTRITION DIAGNOSIS:   Increased nutrient needs related to wound healing as evidenced by estimated needs.  Ongoing.  GOAL:   Patient will meet greater than or equal to 90% of their needs  Progressing.  MONITOR:   PO intake, Supplement acceptance, Labs, Weight trends, I & O's, Skin  ASSESSMENT:   83 y.o. male who presents with chronic venous ulcerations both lower extremities.  **RD working remotely**  Patient currently consuming 100% of meals. Pt is drinking Ensure and Prostat supplements with no issues.  Pt now awaiting discharge to SNF.   Weight was measured on 10/7: 246 lbs.  I/Os: -6.8L since admit UOP x 24 hrs: 800 ml  Labs reviewed. Medications: Vitamin D tablet BID, Fergon tablet  Diet Order:   Diet Order            Diet Heart Room service appropriate? Yes; Fluid consistency: Thin  Diet effective now              EDUCATION NEEDS:   No education needs have been identified at this time  Skin:  Skin Assessment: Skin Integrity Issues: Skin Integrity Issues:: Other (Comment) Other: venous stasis ulcer, possible flea/chigger bites on LEs  Last BM:  10/12  Height:   Ht Readings from Last 1 Encounters:  11/13/18 6\' 4"  (1.93 m)    Weight:   Wt Readings from Last 1 Encounters:  11/16/18 112 kg    Ideal Body Weight:  91.8 kg  BMI:  Body mass index is 30.06 kg/m.  Estimated Nutritional Needs:   Kcal:  2200-2400  Protein:  110-120g  Fluid:  2.2L/day  Clayton Bibles, MS, RD, LDN Inpatient Clinical Dietitian Pager: [phone] After Hours Pager: [phone]

## 2018-11-22 DIAGNOSIS — L03115 Cellulitis of right lower limb: Secondary | ICD-10-CM

## 2018-11-22 DIAGNOSIS — L03116 Cellulitis of left lower limb: Secondary | ICD-10-CM

## 2018-11-22 LAB — NOVEL CORONAVIRUS, NAA (HOSP ORDER, SEND-OUT TO REF LAB; TAT 18-24 HRS): SARS-CoV-2, NAA: NOT DETECTED

## 2018-11-22 MED ORDER — CEPHALEXIN 500 MG PO CAPS: 500.0000 mg | ORAL_CAPSULE | Freq: Three times a day (TID) | ORAL | 0 refills | Status: AC

## 2018-11-22 MED ORDER — METOPROLOL TARTRATE 100 MG PO TABS: 100.0000 mg | ORAL_TABLET | Freq: Every day | ORAL | Status: DC

## 2018-11-22 MED ORDER — DOXYCYCLINE HYCLATE 100 MG PO TABS: 100.0000 mg | ORAL_TABLET | Freq: Two times a day (BID) | ORAL | 0 refills | Status: AC

## 2018-11-22 MED ORDER — DULERA 100-5 MCG/ACT IN AERO: 2.0000 | INHALATION_SPRAY | Freq: Two times a day (BID) | RESPIRATORY_TRACT | 0 refills | Status: DC

## 2018-11-22 MED ORDER — GUAIFENESIN-DM 100-10 MG/5ML PO SYRP: 10.0000 mL | ORAL_SOLUTION | Freq: Four times a day (QID) | ORAL | 0 refills | Status: DC | PRN

## 2018-11-22 MED ORDER — PREDNISONE 20 MG PO TABS: 40.0000 mg | ORAL_TABLET | Freq: Every day | ORAL | 0 refills | Status: AC

## 2018-11-22 MED ORDER — ALBUTEROL SULFATE HFA 108 (90 BASE) MCG/ACT IN AERS: 2.0000 | INHALATION_SPRAY | Freq: Four times a day (QID) | RESPIRATORY_TRACT | 0 refills | Status: DC | PRN

## 2018-11-22 MED ORDER — ATORVASTATIN CALCIUM 20 MG PO TABS: 20.0000 mg | ORAL_TABLET | Freq: Every evening | ORAL | Status: DC

## 2018-11-22 NOTE — Progress Notes (Signed)
MD paged to confirm the status of ble petechiae d/t previous reports given of questionable fleas/chiggers. MD Aline August made aware that noted documentation of petechiae/rash on ble and request for clarification if pt was cleared or needed isolation. Per MD pt is clear and no need for contact isolation.

## 2018-11-22 NOTE — Plan of Care (Signed)
  Problem: Education: Goal: Knowledge of General Education information will improve Description: Including pain rating scale, medication(s)/side effects and non-pharmacologic comfort measures Outcome: Progressing   Problem: Health Behavior/Discharge Planning: Goal: Ability to manage health-related needs will improve Outcome: Progressing   Problem: Clinical Measurements: Goal: Will remain free from infection Outcome: Progressing Goal: Respiratory complications will improve Outcome: Progressing   Problem: Nutrition: Goal: Adequate nutrition will be maintained Outcome: Progressing   Problem: Pain Managment: Goal: General experience of comfort will improve Outcome: Progressing

## 2018-11-22 NOTE — TOC Transition Note (Addendum)
Transition of Care [facility]) - CM/SW Discharge Note   Patient Details  Name: [name] MRN: [id number] Date of Birth: 10/28/1935  [facility]) CM/SW Contact:  Geralynn Ochs, LCSW Phone Number: 11/22/2018, 11:05 AM   Clinical Narrative:   Nurse to call report to [phone].  Transport requested for 1:00 PM.    Final next level of care: [place] Barriers to Discharge: Barriers Resolved   Patient Goals and CMS Choice Patient states their goals for this hospitalization and ongoing recovery are:: to go home CMS Medicare.gov Compare Post Acute Care list provided to:: Patient Choice offered to / list presented to : Patient  Discharge Placement              Patient chooses bed at: [facility] Patient to be transferred to facility by: [place] Name of family member notified: Wife Patient and family notified of of transfer: 11/22/18  Discharge Plan and Services     Post Acute Care Choice: IP Rehab                               Social Determinants of Health (SDOH) Interventions     Readmission Risk Interventions No flowsheet data found.

## 2018-11-22 NOTE — Discharge Summary (Signed)
Physician Discharge Summary  [name] [id number] DOB: 03-02-1935 DOA: 11/12/2018  PCP: Renne Crigler, NP  Admit date: 11/12/2018 Discharge date: 11/22/2018  Admitted From: Home Disposition: SNF  Recommendations for Outpatient Follow-up:  1. Follow up with PCP in 1 week with repeat CBC/BMP 2. Outpatient follow-up with orthopedics/Dr. Sharol Given. 3. Outpatient follow-up with wound clinic. 4. Might need outpatient dermatology evaluation if wounds do not heal. 5. Follow up in ED if symptoms worsen or new appear   Home Health: No Equipment/Devices: Oxygen via nasal cannula at 3 L/min  Discharge Condition: Stable CODE STATUS: Full Diet recommendation: Heart healthy  Brief/Interim Summary: 83 year old male with history of coronary artery disease, colon cancer, bilateral lower extremity lymphedema, venous stasis ulcer was admitted on 11/12/2018 for the management of bilateral lower extremity cellulitis.  He was started on broad-spectrum antibiotics.  Orthopedics recommended compression stockings.  PT recommended CIR.  CIR recommended SNF.  Currently on intravenous antibiotics which will be switched to oral doxycycline and Keflex for 3 more days.  Hemodynamically and medically stable for discharge.  he will be discharged to SNF today if bed is available.  Discharge Diagnoses:   Bilateral lower extremity cellulitis/ulcer Chronic bilateral lower extremity venous insufficiency/lymphedema -Presented with cellulitis along with petechiae, purpuric distress.  This is ongoing for the last 5 years.  Did not respond to outpatient oral antibiotics -Cultures negative so far -Started on Zosyn which was switched to Ancef but subsequently switched to Rocephin because there was a possibility of right lower lobe pneumonia -Orthopedics evaluated the patient today and recommended to continue with compression sock treatment: Patient should continue with the current care with wearing the sock around-the-clock  the sleeve on top of the sock during waking hours and change this daily. It is okay for patient to wet the sock to remove it. -Outpatient follow-up with orthopedics and might need wound care follow-up. -Discharge to SNF on oral doxycycline and Keflex for 3 more days.   Acute hypoxic respiratory failure: New problem -He was a previous smoker.  He might have underlying COPD -Started on steroids with improvement of wheezing.   -Oxygen requirement improving: Now on 3 L oxygen -Patient was given 2 doses of Lasix as well. -CT angiogram of the chest rule out PE but showed possible right lower lobe pneumonia/atelectasis -Antibiotic plan as above. -Echo showed EF of 50 to 55%. -We will discharge on prednisone 40 mg daily for 5 more days along with [facility] and as needed albuterol.  Outpatient pulmonary evaluation.  Abnormal MRI/bilateral lower extremity weakness -MRI showed subacute L4 fracture, neural foraminal stenosis, large osteophyte at L5-S1.  Patient does not complain of any back pain. -PT recommended CIR/SNF.  CIR recommended SNF.  Social worker following. -Medically stable for discharge.  Leukocytosis -Improving.  Monitor  Acute transaminitis -LFTs trended down.  Monitor intermittently.  Asymptomatic bacteriuria -Denies any dysuria.  Urine culture did not show any significant growth.  Hypertension -Blood pressure stable.  Continue amlodipine and metoprolol.  Resume ACE inhibitor on discharge.  Hydrochlorothiazide will remain on hold.  This can be resumed as an outpatient.    Generalized deconditioning -Continue PT at SNF   Discharge Instructions  Discharge Instructions    Ambulatory referral to Pulmonology   Complete by: As directed    Ambulatory referral to Wound Clinic   Complete by: As directed    Diet - low sodium heart healthy   Complete by: As directed    Increase activity slowly   Complete by: As  directed      Allergies as of 11/22/2018      Reactions    Sulfa Antibiotics Other (See Comments)   Didn't feel right      Medication List    STOP taking these medications   hydrochlorothiazide 25 MG tablet Commonly known as: HYDRODIURIL   naproxen sodium 220 MG tablet Commonly known as: ALEVE     TAKE these medications   albuterol 108 (90 Base) MCG/ACT inhaler Commonly known as: VENTOLIN HFA Inhale 2 puffs into the lungs every 6 (six) hours as needed for wheezing or shortness of breath.   amLODipine-benazepril 10-40 MG capsule Commonly known as: LOTREL Take 1 capsule by mouth daily. What changed: when to take this   aspirin 81 MG chewable tablet Chew 81 mg by mouth every evening.   atorvastatin 20 MG tablet Commonly known as: LIPITOR Take 1 tablet (20 mg total) by mouth every evening.   cephALEXin 500 MG capsule Commonly known as: KEFLEX Take 1 capsule (500 mg total) by mouth 3 (three) times daily for 3 days.   doxycycline 100 MG tablet Commonly known as: VIBRA-TABS Take 1 tablet (100 mg total) by mouth every 12 (twelve) hours for 3 days.   Dulera 100-5 MCG/ACT Aero Generic drug: mometasone-formoterol Inhale 2 puffs into the lungs 2 (two) times daily.   guaiFENesin-dextromethorphan 100-10 MG/5ML syrup Commonly known as: ROBITUSSIN DM Take 10 mLs by mouth every 6 (six) hours as needed for cough.   Iron 240 (27 Fe) MG Tabs Take 240 mg by mouth daily.   Lycopene 10 MG Caps Take 10 mg by mouth daily.   metoprolol tartrate 100 MG tablet Commonly known as: LOPRESSOR Take 1 tablet (100 mg total) by mouth at bedtime.   nitroGLYCERIN 0.4 MG SL tablet Commonly known as: NITROSTAT Place 1 tablet (0.4 mg total) under the tongue every 5 (five) minutes as needed. What changed: reasons to take this   predniSONE 20 MG tablet Commonly known as: DELTASONE Take 2 tablets (40 mg total) by mouth daily with breakfast for 5 days. Start taking on: November 23, 2018   Vitamin D (Cholecalciferol) 25 MCG (1000 UT) Tabs Take 1,000  Units by mouth 2 (two) times daily.      Follow-up Information    Newt Minion, MD In 1 week.   Specialty: Orthopedic Surgery Contact information: [place] [address] [phone]        Renne Crigler, NP. Schedule an appointment as soon as possible for a visit in 1 week(s).   Specialty: Nurse Practitioner Why: with repeat cbc/bmp  Contact information: [address] [place] [address] [phone]        [facility]             . Schedule an appointment as soon as possible for a visit in 1 week(s).   Contact information: [address] [phone]         Allergies  Allergen Reactions  . Sulfa Antibiotics Other (See Comments)    Didn't feel right    Consultations: Orthopedics   Procedures/Studies: Dg Eye Foreign Body  Result Date: 11/13/2018 CLINICAL DATA:  Metal working/exposure; clearance prior to MRI EXAM: ORBITS FOR FOREIGN BODY - 2 VIEW COMPARISON:  None. FINDINGS: There is no evidence of metallic foreign body within the orbits. No significant bone abnormality identified. IMPRESSION: No evidence of metallic foreign body within the orbits. Electronically Signed  By: Kathreen Devoid   On: 11/13/2018 05:00   Dg Chest 2 View  Result Date: 11/13/2018 CLINICAL DATA:  Weakness EXAM: CHEST - 2 VIEW COMPARISON:  10/15/2009 FINDINGS: The heart size and mediastinal contours are within normal limits. Both lungs are clear. The visualized skeletal structures are unremarkable. IMPRESSION: No active cardiopulmonary disease. Electronically Signed   By: Ulyses Jarred M.D.   On: 11/13/2018 01:44   Dg Tibia/fibula Left  Result Date: 11/13/2018 CLINICAL DATA:  Left leg skin lesions EXAM: LEFT TIBIA AND FIBULA - 2 VIEW COMPARISON:  None. FINDINGS: No osseous erosion or soft tissue emphysema. There is severe tricompartmental left knee osteoarthrosis. No fracture.  IMPRESSION: Severe tricompartmental left knee osteoarthrosis. No soft tissue emphysema. Electronically Signed   By: Ulyses Jarred M.D.   On: 11/13/2018 01:42   Dg Tibia/fibula Right  Result Date: 11/13/2018 CLINICAL DATA:  Leg pain EXAM: RIGHT TIBIA AND FIBULA - 2 VIEW COMPARISON:  None. FINDINGS: Mild tricompartmental osteoarthrosis with chondrocalcinosis in the femorotibial joint space. No knee effusion. No osseous erosion. There are vascular calcifications. IMPRESSION: Mild tricompartmental osteoarthrosis of the right knee. No acute abnormality of the right tibia or fibula. Electronically Signed   By: Ulyses Jarred M.D.   On: 11/13/2018 01:55   Ct Angio Chest Pe W Or Wo Contrast  Result Date: 11/19/2018 CLINICAL DATA:  Dyspnea EXAM: CT ANGIOGRAPHY CHEST WITH CONTRAST TECHNIQUE: Multidetector CT imaging of the chest was performed using the standard protocol during bolus administration of intravenous contrast. Multiplanar CT image reconstructions and MIPs were obtained to evaluate the vascular anatomy. CONTRAST:  83mL OMNIPAQUE IOHEXOL 350 MG/ML SOLN COMPARISON:  04/09/2017 and previous FINDINGS: Cardiovascular: Heart size normal. No pericardial effusion. Dilated central pulmonary arteries. Satisfactory opacification of pulmonary arteries noted, and there is no evidence of pulmonary emboli. Moderate coronary calcifications. Adequate contrast opacification of the thoracic aorta with no evidence of dissection, aneurysm, or stenosis. There is classic 3-vessel brachiocephalic arch anatomy without proximal stenosis. Scattered atheromatous plaque in the arch. Mediastinum/Nodes: No hilar or mediastinal adenopathy. Lungs/Pleura: Small bilateral pleural effusions. Dependent atelectasis/consolidation posteriorly in lower lobes, right greater than left. 1.6 cm sub-solid nodule in the anterior right upper lobe image 57/6, new since 10/18/2009. Patchy somewhat nodular airspace opacities laterally in the right lower  lobe image 82-83/6, and posteriorly. Upper Abdomen: No acute findings. Musculoskeletal: Anterior vertebral bridging osteophytes at multiple levels in the mid and lower thoracic spine. No fracture or worrisome bone lesion. Review of the MIP images confirms the above findings. IMPRESSION: 1. Negative for acute PE or thoracic aortic dissection. 2. Patchy airspace disease in the right lower lobe. 3. 1.6 cm right upper lobe subsolid nodule, possibly infectious/inflammatory. Initial follow-up with CT at 6-12 months is recommended to confirm resolution. If persistent, repeat CT is recommended every 2 years until 5 years of stability has been established. This recommendation follows the consensus statement: Guidelines for Management of Incidental Pulmonary Nodules Detected on CT Images:From the Fleischner Society 2017; published online before print (10.1148/radiol.SG:[id number]). 4. Coronary and Aortic Atherosclerosis (ICD10-I70.0). Electronically Signed   By: Lucrezia Europe M.D.   On: 11/19/2018 15:05   Mr Lumbar Spine Wo Contrast  Result Date: 11/13/2018 CLINICAL DATA:  Worsening lower extremity weakness EXAM: MRI LUMBAR SPINE WITHOUT CONTRAST TECHNIQUE: Multiplanar, multisequence MR imaging of the lumbar spine was performed. No intravenous contrast was administered. COMPARISON:  None. FINDINGS: Segmentation:  Normal Alignment:  Grade 1 retrolisthesis at L2-3. Vertebrae: Heterogeneous bone marrow signal. Mild edema at the  L4 inferior endplate without significant height loss. No retropulsion. Conus medullaris and cauda equina: Conus extends to the L1 level. Conus and cauda equina appear normal. Paraspinal and other soft tissues: Negative Disc levels: T12-L1: Small right subarticular disc protrusion without spinal canal or neural foraminal stenosis. L1-2: Disc space narrowing without focal herniation. No spinal canal or neural foraminal stenosis. Normal facets. L2-3: Intermediate disc osteophyte complex. No spinal canal  stenosis. Moderate right and mild left foraminal stenosis. L3-4: Disc desiccation with mild bulge. Left-greater-than-right lateral recess narrowing. No central spinal canal or neural foraminal stenosis. L4-5: Left subarticular disc protrusion superimposed on mild bulge. Mild facet hypertrophy. There is mild narrowing of the left lateral recess with mild bilateral neural foraminal stenosis. L5-S1: Large right-sided osteophyte in close proximity to the exiting right L5 nerve root. No central spinal canal stenosis. Mild left foraminal stenosis. IMPRESSION: 1. Mild edema at the L4 inferior endplate without height loss. This may indicate a subacute fracture and could be a source of local low back pain. Correlate for recent fall or other trauma. 2. No central spinal canal stenosis. 3. Multilevel mild lateral recess and neural foraminal stenosis. 4. Large right-sided osteophyte at L5-S1 in close proximity to the exiting right L5 nerve root. Correlate for corresponding radiculopathy. Electronically Signed   By: Ulyses Jarred M.D.   On: 11/13/2018 05:58   Dg Chest Port 1 View  Result Date: 11/16/2018 CLINICAL DATA:  Shortness of breath and cough EXAM: PORTABLE CHEST 1 VIEW COMPARISON:  11/13/2018 FINDINGS: Cardiac shadow is stable but slightly accentuated by the portable technique. Very mild vascular congestion is noted increased from the prior exam with minimal interstitial edema. No sizable effusion or focal infiltrate is noted. No bony abnormality is seen. IMPRESSION: Changes of mild vascular congestion and interstitial edema. Electronically Signed   By: Inez Catalina M.D.   On: 11/16/2018 16:25   Dg Foot Complete Left  Result Date: 11/13/2018 CLINICAL DATA:  Skin lesions EXAM: LEFT FOOT - COMPLETE 3+ VIEW COMPARISON:  None. FINDINGS: No osseous erosion or soft tissue emphysema. No fracture or dislocation. There are vascular calcifications. The bones are mildly osteopenic. IMPRESSION: No radiographic evidence of  osteomyelitis. No fracture or dislocation of the left foot. Electronically Signed   By: Ulyses Jarred M.D.   On: 11/13/2018 01:54   Dg Foot Complete Right  Result Date: 11/13/2018 CLINICAL DATA:  Lower extremity skin lesions EXAM: RIGHT FOOT COMPLETE - 3+ VIEW COMPARISON:  None. FINDINGS: Bones are osteopenic. There is no focal osseous erosion. No fracture or dislocation. Mild midfoot osteoarthrosis. IMPRESSION: 1. No radiographic evidence of osteomyelitis in the right foot. Mild midfoot osteoarthrosis. 2. Osteopenia. Electronically Signed   By: Ulyses Jarred M.D.   On: 11/13/2018 01:55    Echo IMPRESSIONS   1. Left ventricular ejection fraction, by visual estimation, is 50 to 55%. The left ventricle has normal function. Mildly increased left ventricular size. There is no left ventricular hypertrophy. 2. Left ventricular diastolic Doppler parameters are consistent with pseudonormalization pattern of LV diastolic filling. 3. Global right ventricle has normal systolic function.The right ventricular size is moderately enlarged. 4. Left atrial size was normal. 5. Right atrial size was moderately dilated. 6. Mild mitral annular calcification. 7. The mitral valve is normal in structure. No evidence of mitral valve regurgitation. No evidence of mitral stenosis. 8. The tricuspid valve is normal in structure. Tricuspid valve regurgitation is mild. 9. The aortic valve was not well visualized Aortic valve regurgitation was not visualized  by color flow Doppler. 10. The pulmonic valve was not well visualized. Pulmonic valve regurgitation was not assessed by color flow Doppler. 11. Mildly elevated pulmonary artery systolic pressure. 12. The inferior vena cava is dilated in size with >50% respiratory variability, suggesting right atrial pressure of 8 mmHg. 13. Technically difficult; probable low normal LV function; grade 2 diastolic dysfunction; mild LVE; right heart not well visualized but there  appears to be moderate RAE and RVE.   Subjective: Patient seen and examined at bedside.  He feels better.  Denies any overnight fever, nausea, vomiting or worsening shortness of breath.  Discharge Exam: Vitals:   11/22/18 0750 11/22/18 0855  BP:    Pulse: 61   Resp:    Temp: 97.7 F (36.5 C)   SpO2:  96%    General: Pt is alert, awake, not in acute distress.  Elderly male lying in bed. Cardiovascular: rate controlled, S1/S2 + Respiratory: bilateral decreased breath sounds at bases with scattered crackles Abdominal: Soft, NT, ND, bowel sounds + Extremities:  no cyanosis.  Bilateral lower extremity compression socks present    The results of significant diagnostics from this hospitalization (including imaging, microbiology, ancillary and laboratory) are listed below for reference.     Microbiology: Recent Results (from the past 240 hour(s))  Blood culture (routine x 2)     Status: None   Collection Time: 11/13/18 12:30 AM   Specimen: BLOOD RIGHT HAND  Result Value Ref Range Status   Specimen Description BLOOD RIGHT HAND  Final   Special Requests   Final    BOTTLES DRAWN AEROBIC AND ANAEROBIC Blood Culture results may not be optimal due to an inadequate volume of blood received in culture bottles   Culture   Final    NO GROWTH 5 DAYS Performed at [facility], [place] [address], [place], [place] [address]    Report Status 11/18/2018 FINAL  Final  Blood culture (routine x 2)     Status: None   Collection Time: 11/13/18 12:50 AM   Specimen: BLOOD RIGHT WRIST  Result Value Ref Range Status   Specimen Description BLOOD RIGHT WRIST  Final   Special Requests   Final    BOTTLES DRAWN AEROBIC AND ANAEROBIC Blood Culture adequate volume   Culture   Final    NO GROWTH 5 DAYS Performed at [facility], [place] [address], [place], [place] [address]    Report Status 11/18/2018 FINAL  Final  Urine Culture     Status: Abnormal   Collection Time: 11/13/18 12:50 AM    Specimen: Urine, Random  Result Value Ref Range Status   Specimen Description URINE, RANDOM  Final   Special Requests NONE  Final   Culture (A)  Final    <10,000 COLONIES/mL INSIGNIFICANT GROWTH Performed at [facility], [place] [address] [place], [place] [address]    Report Status 11/14/2018 FINAL  Final  SARS CORONAVIRUS 2 (TAT 6-24 HRS) Nasopharyngeal Nasopharyngeal Swab     Status: None   Collection Time: 11/13/18  2:28 AM   Specimen: Nasopharyngeal Swab  Result Value Ref Range Status   SARS Coronavirus 2 NEGATIVE NEGATIVE Final    Comment: (NOTE) SARS-CoV-2 target nucleic acids are NOT DETECTED. The SARS-CoV-2 RNA is generally detectable in upper and lower respiratory specimens during the acute phase of infection. Negative results do not preclude SARS-CoV-2 infection, do not rule out co-infections with other pathogens, and should not be used as the sole basis for treatment  or other patient management decisions. Negative results must be combined with clinical observations, patient history, and epidemiological information. The expected result is Negative. Fact Sheet for Patients: SugarRoll.be Fact Sheet for Healthcare Providers: https://www.woods-mathews.com/ This test is not yet approved or cleared by the [place] FDA and  has been authorized for detection and/or diagnosis of SARS-CoV-2 by FDA under an Emergency Use Authorization (EUA). This EUA will remain  in effect (meaning this test can be used) for the duration of the COVID-19 declaration under Section 56 4(b)(1) of the Act, 21 U.S.C. section 360bbb-3(b)(1), unless the authorization is terminated or revoked sooner. Performed at [facility], [place] [address] [place], [place] [address]   SARS CORONAVIRUS 2 (TAT 6-24 HRS) Nasopharyngeal Nasopharyngeal Swab     Status: None   Collection Time: 11/17/18  3:52 PM   Specimen: Nasopharyngeal Swab  Result Value Ref Range  Status   SARS Coronavirus 2 NEGATIVE NEGATIVE Final    Comment: (NOTE) SARS-CoV-2 target nucleic acids are NOT DETECTED. The SARS-CoV-2 RNA is generally detectable in upper and lower respiratory specimens during the acute phase of infection. Negative results do not preclude SARS-CoV-2 infection, do not rule out co-infections with other pathogens, and should not be used as the sole basis for treatment or other patient management decisions. Negative results must be combined with clinical observations, patient history, and epidemiological information. The expected result is Negative. Fact Sheet for Patients: SugarRoll.be Fact Sheet for Healthcare Providers: https://www.woods-mathews.com/ This test is not yet approved or cleared by the [place] FDA and  has been authorized for detection and/or diagnosis of SARS-CoV-2 by FDA under an Emergency Use Authorization (EUA). This EUA will remain  in effect (meaning this test can be used) for the duration of the COVID-19 declaration under Section 56 4(b)(1) of the Act, 21 U.S.C. section 360bbb-3(b)(1), unless the authorization is terminated or revoked sooner. Performed at [facility], [place] [address] [place], [place] [address]   Novel Coronavirus, NAA (hospital order; send-out to ref lab)     Status: None   Collection Time: 11/21/18  5:18 PM   Specimen: Nasopharyngeal Swab; Respiratory  Result Value Ref Range Status   SARS-CoV-2, NAA NOT DETECTED NOT DETECTED Final    Comment: (NOTE) This nucleic acid amplification test was developed and its performance characteristics determined by [employer]. Nucleic acid amplification tests include PCR and TMA. This test has not been FDA cleared or approved. This test has been authorized by FDA under an Emergency Use Authorization (EUA). This test is only authorized for the duration of time the declaration that circumstances exist justifying  the authorization of the emergency use of in vitro diagnostic tests for detection of SARS-CoV-2 virus and/or diagnosis of COVID-19 infection under section 564(b)(1) of the Act, 21 U.S.C. [id number]) (1), unless the authorization is terminated or revoked sooner. When diagnostic testing is negative, the possibility of a false negative result should be considered in the context of a patient's recent exposures and the presence of clinical signs and symptoms consistent with COVID-19. An individual without symptoms of COVID- 19 and who is not shedding SARS-CoV-2 vi rus would expect to have a negative (not detected) result in this assay. Performed At: [facility] [address] [place], [place] [id number] Rush Farmer MD [id number]    [place]  Final    Comment: Performed at [facility], [place] [address] [place], [place] [address]     Labs: BNP (last 3 results) No  results for input(s): BNP in the last 8760 hours. Basic Metabolic Panel: Recent Labs  Lab 11/16/18 0349 11/18/18 0429 11/20/18 0753  NA 141 140 136  K 4.1 4.2 4.3  CL 102 95* 94*  CO2 30 33* 31  GLUCOSE 111* 160* 145*  BUN 33* 37* 32*  CREATININE 1.16 1.18 0.94  CALCIUM 8.5* 8.7* 8.5*   Liver Function Tests: Recent Labs  Lab 11/16/18 0349  AST 58*  ALT 64*  ALKPHOS 55  BILITOT 0.6  PROT 6.6  ALBUMIN 2.0*   No results for input(s): LIPASE, AMYLASE in the last 168 hours. No results for input(s): AMMONIA in the last 168 hours. CBC: Recent Labs  Lab 11/16/18 0349 11/17/18 0346 11/20/18 0753  WBC 18.5* 13.1* 12.6*  NEUTROABS  --  12.6* 11.5*  HGB 10.7* 10.4* 11.4*  HCT 34.2* 32.9* 35.8*  MCV 96.1 94.3 93.5  PLT 409* 412* 552*   Cardiac Enzymes: No results for input(s): CKTOTAL, CKMB, CKMBINDEX, TROPONINI in the last 168 hours. BNP: Invalid input(s): POCBNP CBG: Recent Labs  Lab 11/21/18 2043  GLUCAP 131*   D-Dimer No results for input(s): DDIMER in  the last 72 hours. Hgb A1c No results for input(s): HGBA1C in the last 72 hours. Lipid Profile No results for input(s): CHOL, HDL, LDLCALC, TRIG, CHOLHDL, LDLDIRECT in the last 72 hours. Thyroid function studies No results for input(s): TSH, T4TOTAL, T3FREE, THYROIDAB in the last 72 hours.  Invalid input(s): FREET3 Anemia work up No results for input(s): VITAMINB12, FOLATE, FERRITIN, TIBC, IRON, RETICCTPCT in the last 72 hours. Urinalysis    Component Value Date/Time   COLORURINE YELLOW 11/13/2018 0049   APPEARANCEUR CLEAR 11/13/2018 0049   LABSPEC 1.024 11/13/2018 0049   PHURINE 5.0 11/13/2018 0049   GLUCOSEU NEGATIVE 11/13/2018 0049   HGBUR LARGE (A) 11/13/2018 0049   BILIRUBINUR NEGATIVE 11/13/2018 0049   KETONESUR NEGATIVE 11/13/2018 0049   PROTEINUR 30 (A) 11/13/2018 0049   NITRITE NEGATIVE 11/13/2018 0049   LEUKOCYTESUR NEGATIVE 11/13/2018 0049   Sepsis Labs Invalid input(s): PROCALCITONIN,  WBC,  LACTICIDVEN Microbiology Recent Results (from the past 240 hour(s))  Blood culture (routine x 2)     Status: None   Collection Time: 11/13/18 12:30 AM   Specimen: BLOOD RIGHT HAND  Result Value Ref Range Status   Specimen Description BLOOD RIGHT HAND  Final   Special Requests   Final    BOTTLES DRAWN AEROBIC AND ANAEROBIC Blood Culture results may not be optimal due to an inadequate volume of blood received in culture bottles   Culture   Final    NO GROWTH 5 DAYS Performed at [facility], [place] [address], [place], [place] [address]    Report Status 11/18/2018 FINAL  Final  Blood culture (routine x 2)     Status: None   Collection Time: 11/13/18 12:50 AM   Specimen: BLOOD RIGHT WRIST  Result Value Ref Range Status   Specimen Description BLOOD RIGHT WRIST  Final   Special Requests   Final    BOTTLES DRAWN AEROBIC AND ANAEROBIC Blood Culture adequate volume   Culture   Final    NO GROWTH 5 DAYS Performed at [facility], [place] [address], [place], [place]  [address]    Report Status 11/18/2018 FINAL  Final  Urine Culture     Status: Abnormal   Collection Time: 11/13/18 12:50 AM   Specimen: Urine, Random  Result Value Ref Range Status   Specimen Description URINE, RANDOM  Final   Special Requests NONE  Final   Culture (A)  Final    <10,000 COLONIES/mL INSIGNIFICANT GROWTH Performed at [facility], [place] [address], [place], [place] [address]    Report Status 11/14/2018 FINAL  Final  SARS CORONAVIRUS 2 (TAT 6-24 HRS) Nasopharyngeal Nasopharyngeal Swab     Status: None   Collection Time: 11/13/18  2:28 AM   Specimen: Nasopharyngeal Swab  Result Value Ref Range Status   SARS Coronavirus 2 NEGATIVE NEGATIVE Final    Comment: (NOTE) SARS-CoV-2 target nucleic acids are NOT DETECTED. The SARS-CoV-2 RNA is generally detectable in upper and lower respiratory specimens during the acute phase of infection. Negative results do not preclude SARS-CoV-2 infection, do not rule out co-infections with other pathogens, and should not be used as the sole basis for treatment or other patient management decisions. Negative results must be combined with clinical observations, patient history, and epidemiological information. The expected result is Negative. Fact Sheet for Patients: SugarRoll.be Fact Sheet for Healthcare Providers: https://www.woods-mathews.com/ This test is not yet approved or cleared by the [place] FDA and  has been authorized for detection and/or diagnosis of SARS-CoV-2 by FDA under an Emergency Use Authorization (EUA). This EUA will remain  in effect (meaning this test can be used) for the duration of the COVID-19 declaration under Section 56 4(b)(1) of the Act, 21 U.S.C. section 360bbb-3(b)(1), unless the authorization is terminated or revoked sooner. Performed at [facility], [place] [address], [place], [place] [address]   SARS CORONAVIRUS 2 (TAT 6-24 HRS) Nasopharyngeal  Nasopharyngeal Swab     Status: None   Collection Time: 11/17/18  3:52 PM   Specimen: Nasopharyngeal Swab  Result Value Ref Range Status   SARS Coronavirus 2 NEGATIVE NEGATIVE Final    Comment: (NOTE) SARS-CoV-2 target nucleic acids are NOT DETECTED. The SARS-CoV-2 RNA is generally detectable in upper and lower respiratory specimens during the acute phase of infection. Negative results do not preclude SARS-CoV-2 infection, do not rule out co-infections with other pathogens, and should not be used as the sole basis for treatment or other patient management decisions. Negative results must be combined with clinical observations, patient history, and epidemiological information. The expected result is Negative. Fact Sheet for Patients: SugarRoll.be Fact Sheet for Healthcare Providers: https://www.woods-mathews.com/ This test is not yet approved or cleared by the [place] FDA and  has been authorized for detection and/or diagnosis of SARS-CoV-2 by FDA under an Emergency Use Authorization (EUA). This EUA will remain  in effect (meaning this test can be used) for the duration of the COVID-19 declaration under Section 56 4(b)(1) of the Act, 21 U.S.C. section 360bbb-3(b)(1), unless the authorization is terminated or revoked sooner. Performed at [facility], [place] [address] [place], [place] [address]   Novel Coronavirus, NAA (hospital order; send-out to ref lab)     Status: None   Collection Time: 11/21/18  5:18 PM   Specimen: Nasopharyngeal Swab; Respiratory  Result Value Ref Range Status   SARS-CoV-2, NAA NOT DETECTED NOT DETECTED Final    Comment: (NOTE) This nucleic acid amplification test was developed and its performance characteristics determined by [employer]. Nucleic acid amplification tests include PCR and TMA. This test has not been FDA cleared or approved. This test has been authorized by FDA under an Emergency  Use Authorization (EUA). This test is only authorized for the duration of time the declaration that circumstances exist justifying the authorization of the emergency use of in  vitro diagnostic tests for detection of SARS-CoV-2 virus and/or diagnosis of COVID-19 infection under section 564(b)(1) of the Act, 21 U.S.C. [id number]) (1), unless the authorization is terminated or revoked sooner. When diagnostic testing is negative, the possibility of a false negative result should be considered in the context of a patient's recent exposures and the presence of clinical signs and symptoms consistent with COVID-19. An individual without symptoms of COVID- 19 and who is not shedding SARS-CoV-2 vi rus would expect to have a negative (not detected) result in this assay. Performed At: [facility] [address] [place], [place] [id number] Rush Farmer MD [id number]    Stout  Final    Comment: Performed at [facility], [place] [address] [place], [place] [address]     Time coordinating discharge: 35 minutes  SIGNED:   Aline August, MD  [facility] 11/22/2018, 9:54 AM

## 2018-12-01 ENCOUNTER — Ambulatory Visit: Payer: Medicare Other | Admitting: Orthopedic Surgery

## 2018-12-09 ENCOUNTER — Other Ambulatory Visit: Payer: Self-pay

## 2018-12-09 DIAGNOSIS — I878 Other specified disorders of veins: Secondary | ICD-10-CM

## 2018-12-12 ENCOUNTER — Encounter (HOSPITAL_BASED_OUTPATIENT_CLINIC_OR_DEPARTMENT_OTHER): Payer: Medicare Other | Attending: Internal Medicine | Admitting: Internal Medicine

## 2018-12-12 ENCOUNTER — Other Ambulatory Visit: Payer: Self-pay

## 2018-12-12 DIAGNOSIS — N400 Enlarged prostate without lower urinary tract symptoms: Secondary | ICD-10-CM | POA: Insufficient documentation

## 2018-12-12 DIAGNOSIS — Z955 Presence of coronary angioplasty implant and graft: Secondary | ICD-10-CM | POA: Diagnosis not present

## 2018-12-12 DIAGNOSIS — I714 Abdominal aortic aneurysm, without rupture: Secondary | ICD-10-CM | POA: Insufficient documentation

## 2018-12-12 DIAGNOSIS — I10 Essential (primary) hypertension: Secondary | ICD-10-CM | POA: Insufficient documentation

## 2018-12-12 DIAGNOSIS — I2510 Atherosclerotic heart disease of native coronary artery without angina pectoris: Secondary | ICD-10-CM | POA: Insufficient documentation

## 2018-12-12 DIAGNOSIS — I890 Lymphedema, not elsewhere classified: Secondary | ICD-10-CM | POA: Diagnosis not present

## 2018-12-12 DIAGNOSIS — L97328 Non-pressure chronic ulcer of left ankle with other specified severity: Secondary | ICD-10-CM | POA: Insufficient documentation

## 2018-12-12 DIAGNOSIS — I87333 Chronic venous hypertension (idiopathic) with ulcer and inflammation of bilateral lower extremity: Secondary | ICD-10-CM | POA: Diagnosis not present

## 2018-12-12 DIAGNOSIS — L97518 Non-pressure chronic ulcer of other part of right foot with other specified severity: Secondary | ICD-10-CM | POA: Diagnosis not present

## 2018-12-12 DIAGNOSIS — L97818 Non-pressure chronic ulcer of other part of right lower leg with other specified severity: Secondary | ICD-10-CM | POA: Diagnosis not present

## 2018-12-15 ENCOUNTER — Telehealth (HOSPITAL_COMMUNITY): Payer: Self-pay | Admitting: *Deleted

## 2018-12-15 NOTE — Telephone Encounter (Signed)
12/15/2018 8:40 Confirmed/CSS/ss

## 2018-12-16 ENCOUNTER — Encounter (HOSPITAL_COMMUNITY): Payer: Medicare Other

## 2018-12-26 ENCOUNTER — Other Ambulatory Visit: Payer: Self-pay

## 2018-12-26 ENCOUNTER — Encounter (HOSPITAL_BASED_OUTPATIENT_CLINIC_OR_DEPARTMENT_OTHER): Payer: Medicare Other | Admitting: Internal Medicine

## 2018-12-26 DIAGNOSIS — I87333 Chronic venous hypertension (idiopathic) with ulcer and inflammation of bilateral lower extremity: Secondary | ICD-10-CM | POA: Diagnosis not present

## 2018-12-26 NOTE — Progress Notes (Signed)
DVANTE, [name] ([id number]) Visit Report for 12/26/2018 Debridement Details Patient Name: Date of Service: [name], [name] 12/26/2018 10:00 AM Medical Record Number:[id number] Patient Account Number: [id number] Date of Birth/Sex: [birth year] (83 y.o. M) Treating RN: Levan Hurst Primary Care Provider: Haynes Hoehn Other Clinician: Referring Provider: Treating Provider/Extender:Homar Weinkauf, [name], [name]: 2 Debridement Performed for Wound #2 Right Lower Leg Assessment: Performed By: Physician Ricard Dillon., MD Debridement Type: Debridement Severity of Tissue Pre Fat layer exposed Debridement: Level of Consciousness (Pre- Awake and Alert procedure): Pre-procedure Verification/Time Out Taken: Yes - 11:15 Start Time: 11:15 Total Area Debrided (L x W): 4 (cm) x 4 (cm) = 16 (cm) Tissue and other material Viable, Non-Viable, Slough, Subcutaneous, Slough debrided: Level: Skin/Subcutaneous Tissue Debridement Description: Excisional Instrument: Curette Bleeding: Minimum Hemostasis Achieved: Pressure End Time: 11:17 Procedural Pain: 3 Post Procedural Pain: 0 Response to Treatment: Procedure was tolerated well Level of Consciousness Awake and Alert (Post-procedure): Post Debridement Measurements of Total Wound Length: (cm) 18 Width: (cm) 9.5 Depth: (cm) 0.1 Volume: (cm) 13.43 Character of Wound/Ulcer Post Improved Debridement: Severity of Tissue Post Debridement: Fat layer exposed Post Procedure Diagnosis Same as Pre-procedure Electronic Signature(s) Signed: 12/26/2018 5:46:17 PM By: Linton Ham MD Signed: 12/26/2018 5:59:11 PM By: Levan Hurst RN, BSN Entered By: Linton Ham on 12/26/2018 11:59:38 -------------------------------------------------------------------------------- HPI Details Patient Name: Date of Service: [name]. 12/26/2018 10:00 AM Medical Record Number:[id number] Patient Account Number: [id number] Date of  Birth/Sex: Treating RN: [birth year] (83 y.o. [name] Primary Care Provider: Haynes Hoehn Other Clinician: Referring Provider: Treating Provider/Extender:Marycatherine Maniscalco, [name], [name]: 2 History of Present Illness HPI Description: ADMISSION 12/12/2018 This is an 83 year old man who is a very complicated patient. He has apparently been followed at the wound care center at [facility] in [place] for a number of years with ulcers that have been described as secondary to chronic venous insufficiency with secondary lymphedema. His wife states that these will come and go she has been to that center multiple times. Most of the recent wounds have apparently been on the left leg. She states that at the end of September she started to see brown spots developing on the right leg which progressed and moved into necrotic areas on multiple areas of the right lower leg. Also spots on the dorsal feet. He started to develop generalized weakness could not walk. He was admitted for 1 day in early October to [facility] but was discharged and told that he had a UTI. He was then admitted from 11/12/2018 through 11/22/2018. He was felt to have bilateral lower extremity cellulitis on the background of lymphedema and venous stasis ulceration. He was treated with broad-spectrum antibiotics. He was reviewed by Dr. Sharol Given and provided with some form of compression stocking although the patient states that the drainage from his wound stuck to these and cause damage to the skin when these were taken off. He has since been discharged to skilled facility associated with [facility]. The patient's wife is quite descriptive although unfortunately she did not actually take pictures of the wound development. She stated that they had never seen anything like this before. Then there was the deterioration with regards to his mobility. I am not sure that that is gotten any better. Past medical  history; hypertension, BPH, coronary artery disease with stents, malignant tumor of the colon, abdominal aortic aneurysm followed with annual ultrasounds but I am not really sure who is following this ABIs in our clinic  were 0.74 on the right 0.61 on the left 11/16; patient's appointment with Dr. Donzetta Matters of vascular surgery is not till 10/23. I did put in a secure text message about this patient. He comes in today with some multiple wound areas on the right leg looking a lot better. Most substantially the wounds are located on the right lateral lower leg. On the left there is the left medial calcaneus. The patient clearly has chronic venous insufficiency with secondary lymphedema however I wondered whether he had macrovascular disease and/or some of the damage on the right leg could be related to a vasculopathy. In any case today things look substantially better than last week. The patient is still at [facility] Signature(s) Signed: 12/26/2018 5:46:17 PM By: Linton Ham MD Entered By: Linton Ham on 12/26/2018 12:01:11 -------------------------------------------------------------------------------- Physical Exam Details Patient Name: Date of Service: Job, [name] 12/26/2018 10:00 AM Medical Record Number:[id number] Patient Account Number: [id number] Date of Birth/Sex: Treating RN: 04/28/1935 (83 y.o. [name] Primary Care Provider: Haynes Hoehn Other Clinician: Referring Provider: Treating Provider/Extender:Trenton Verne, [name], [name]: 2 Constitutional Sitting or standing Blood Pressure is within target range for patient.. Pulse regular and within target range for patient.Marland Kitchen Respirations regular, non-labored and within target range.. Temperature is normal and within the target range for the patient.Marland Kitchen Appears in no distress. Eyes Conjunctivae clear. No discharge.no icterus. Respiratory work of breathing is  normal. Cardiovascular Dorsalis pedis pulses palpable bilaterally.. Integumentary (Hair, Skin) Changes of chronic venous insufficiency bilaterally. Edema is under reasonable control. Psychiatric appears at normal baseline. Notes Wound exam There are multiple wounds on the right lower extremity but some of them appear to be better than last time I E the eschar over the top did not really ultimately result in the wound. Most substantially the areas are on the anterior right lower extremity and lateral right lower extremity. On the left he has an area over his medial malleolus extending into the medial heel. Areas on the right lateral lower leg were debrided with a #5 curette. Electronic Signature(s) Signed: 12/26/2018 5:46:17 PM By: Linton Ham MD Entered By: Linton Ham on 12/26/2018 12:02:48 -------------------------------------------------------------------------------- Physician Orders Details Patient Name: Date of Service: Puffenbarger, Zuriel G. 12/26/2018 10:00 AM Medical Record Number:[id number] Patient Account Number: [id number] Date of Birth/Sex: Treating RN: Apr 12, 1935 (83 y.o. [name] Primary Care Provider: Haynes Hoehn Other Clinician: Referring Provider: Treating Provider/Extender:Coreon Simkins, [name], [name]: 2 Verbal / Phone Orders: No Diagnosis Coding ICD-10 Coding Code Description I87.333 Chronic venous hypertension (idiopathic) with ulcer and inflammation of bilateral lower extremity L97.818 Non-pressure chronic ulcer of other part of right lower leg with other specified severity L97.518 Non-pressure chronic ulcer of other part of right foot with other specified severity L97.328 Non-pressure chronic ulcer of left ankle with other specified severity Follow-up Appointments Return Appointment in 2 weeks. Dressing Change Frequency Wound #2 Right Lower Leg Change Dressing every other day. Wound #4 Left,Medial Malleolus Change Dressing  every other day. Skin Barriers/Peri-Wound Care Wound #4 Left,Medial Malleolus Moisturizing lotion - both legs TCA Cream or Ointment - both legs - mixed with lotion Wound Cleansing Wound #2 Right Lower Leg May shower and wash wound with soap and water. - on days that dressing is changed Wound #4 Left,Medial Malleolus May shower and wash wound with soap and water. - on days that dressing is changed Primary Wound Dressing Wound #2 Right Lower Leg Calcium Alginate with Silver Wound #4 Left,Medial Malleolus Calcium Alginate  with Silver Secondary Dressing Wound #2 Right Lower Leg Dry Gauze ABD pad Wound #4 Left,Medial Malleolus Dry Gauze ABD pad Edema Control Kerlix and Coban - Bilateral Avoid standing for long periods of time Elevate legs to the level of the heart or above for 30 minutes daily and/or when sitting, a frequency of: - throughout the day Off-Loading Turn and reposition every 2 hours Electronic Signature(s) Signed: 12/26/2018 5:46:17 PM By: Linton Ham MD Signed: 12/26/2018 5:59:11 PM By: Levan Hurst RN, BSN Entered By: Levan Hurst on 12/26/2018 11:21:18 -------------------------------------------------------------------------------- Problem List Details Patient Name: Date of Service: Welker, Nihar G. 12/26/2018 10:00 AM Medical Record Number:[id number] Patient Account Number: [id number] Date of Birth/Sex: Treating RN: [birth year] (83 y.o. [name] Primary Care Provider: Haynes Hoehn Other Clinician: Referring Provider: Treating Provider/Extender:Bevelyn Arriola, [name], [name]: 2 Active Problems ICD-10 Evaluated Encounter Code Description Active Date Today Diagnosis I87.333 Chronic venous hypertension (idiopathic) with ulcer 12/12/2018 No Yes and inflammation of bilateral lower extremity L97.818 Non-pressure chronic ulcer of other part of right lower 12/12/2018 No Yes leg with other specified severity L97.518 Non-pressure  chronic ulcer of other part of right foot 12/12/2018 No Yes with other specified severity L97.328 Non-pressure chronic ulcer of left ankle with other 12/12/2018 No Yes specified severity Inactive Problems Resolved Problems Electronic Signature(s) Signed: 12/26/2018 5:46:17 PM By: Linton Ham MD Entered By: Linton Ham on 12/26/2018 11:59:19 -------------------------------------------------------------------------------- Progress Note Details Patient Name: Date of Service: [name]. 12/26/2018 10:00 AM Medical Record Number:[id number] Patient Account Number: [id number] Date of Birth/Sex: Treating RN: 1936/01/28 (83 y.o. [name] Primary Care Provider: Haynes Hoehn Other Clinician: Referring Provider: Treating Provider/Extender:Avrohom Mckelvin, [name], [name]: 2 Subjective History of Present Illness (HPI) ADMISSION 12/12/2018 This is an 83 year old man who is a very complicated patient. He has apparently been followed at the wound care center at [facility] in [place] for a number of years with ulcers that have been described as secondary to chronic venous insufficiency with secondary lymphedema. His wife states that these will come and go she has been to that center multiple times. Most of the recent wounds have apparently been on the left leg. She states that at the end of September she started to see brown spots developing on the right leg which progressed and moved into necrotic areas on multiple areas of the right lower leg. Also spots on the dorsal feet. He started to develop generalized weakness could not walk. He was admitted for 1 day in early October to [facility] but was discharged and told that he had a UTI. He was then admitted from 11/12/2018 through 11/22/2018. He was felt to have bilateral lower extremity cellulitis on the background of lymphedema and venous stasis ulceration. He was treated with broad-spectrum antibiotics. He  was reviewed by Dr. Sharol Given and provided with some form of compression stocking although the patient states that the drainage from his wound stuck to these and cause damage to the skin when these were taken off. He has since been discharged to skilled facility associated with [facility]. The patient's wife is quite descriptive although unfortunately she did not actually take pictures of the wound development. She stated that they had never seen anything like this before. Then there was the deterioration with regards to his mobility. I am not sure that that is gotten any better. Past medical history; hypertension, BPH, coronary artery disease with stents, malignant tumor of the colon, abdominal aortic aneurysm followed with annual ultrasounds  but I am not really sure who is following this ABIs in our clinic were 0.74 on the right 0.61 on the left 11/16; patient's appointment with Dr. Donzetta Matters of vascular surgery is not till 10/23. I did put in a secure text message about this patient. He comes in today with some multiple wound areas on the right leg looking a lot better. Most substantially the wounds are located on the right lateral lower leg. On the left there is the left medial calcaneus. The patient clearly has chronic venous insufficiency with secondary lymphedema however I wondered whether he had macrovascular disease and/or some of the damage on the right leg could be related to a vasculopathy. In any case today things look substantially better than last week. The patient is still at [facility] skilled facility Objective Constitutional Sitting or standing Blood Pressure is within target range for patient.. Pulse regular and within target range for patient.Marland Kitchen Respirations regular, non-labored and within target range.. Temperature is normal and within the target range for the patient.Marland Kitchen Appears in no distress. Vitals Time Taken: 10:30 AM, Height: 76 in, Weight: 227 lbs, BMI: 27.6, Temperature:  97.8 F, Pulse: 59 bpm, Respiratory Rate: 18 breaths/min, Blood Pressure: 112/60 mmHg. Eyes Conjunctivae clear. No discharge.no icterus. Respiratory work of breathing is normal. Cardiovascular Dorsalis pedis pulses palpable bilaterally.Marland Kitchen Psychiatric appears at normal baseline. General Notes: Wound exam ooThere are multiple wounds on the right lower extremity but some of them appear to be better than last time I E the eschar over the top did not really ultimately result in the wound. Most substantially the areas are on the anterior right lower extremity and lateral right lower extremity. ooOn the left he has an area over his medial malleolus extending into the medial heel. ooAreas on the right lateral lower leg were debrided with a #5 curette. Integumentary (Hair, Skin) Changes of chronic venous insufficiency bilaterally. Edema is under reasonable control. Wound #1 status is Healed - Epithelialized. Original cause of wound was Trauma. The wound is located on the Right Knee. The wound measures 0cm length x 0cm width x 0cm depth; 0cm^2 area and 0cm^3 volume. There is no tunneling or undermining noted. There is a none present amount of drainage noted. The wound margin is flat and intact. There is no granulation within the wound bed. There is no necrotic tissue within the wound bed. Wound #2 status is Open. Original cause of wound was Gradually Appeared. The wound is located on the Right Lower Leg. The wound measures 18cm length x 9.5cm width x 0.1cm depth; 134.303cm^2 area and 13.43cm^3 volume. There is Fat Layer (Subcutaneous Tissue) Exposed exposed. There is no tunneling or undermining noted. There is a small amount of serosanguineous drainage noted. The wound margin is distinct with the outline attached to the wound base. There is medium (34-66%) red, hyper - granulation within the wound bed. There is a medium (34-66%) amount of necrotic tissue within the wound bed including Eschar and  Adherent Slough. Wound #3 status is Healed - Epithelialized. Original cause of wound was Trauma. The wound is located on the Left Knee. The wound measures 0cm length x 0cm width x 0cm depth; 0cm^2 area and 0cm^3 volume. There is no tunneling or undermining noted. There is a none present amount of drainage noted. The wound margin is flat and intact. There is no granulation within the wound bed. There is no necrotic tissue within the wound bed. Wound #4 status is Open. Original cause of wound was Gradually Appeared.  The wound is located on the Left,Medial Malleolus. The wound measures 2cm length x 4.9cm width x 0.1cm depth; 7.697cm^2 area and 0.77cm^3 volume. There is Fat Layer (Subcutaneous Tissue) Exposed exposed. There is no tunneling or undermining noted. There is a medium amount of serosanguineous drainage noted. The wound margin is distinct with the outline attached to the wound base. There is medium (34-66%) red, pink, hyper - granulation within the wound bed. There is a medium (34-66%) amount of necrotic tissue within the wound bed including Adherent Slough. Assessment Active Problems ICD-10 Chronic venous hypertension (idiopathic) with ulcer and inflammation of bilateral lower extremity Non-pressure chronic ulcer of other part of right lower leg with other specified severity Non-pressure chronic ulcer of other part of right foot with other specified severity Non-pressure chronic ulcer of left ankle with other specified severity Procedures Wound #2 Pre-procedure diagnosis of Wound #2 is a Venous Leg Ulcer located on the Right Lower Leg .Severity of Tissue Pre Debridement is: Fat layer exposed. There was a Excisional Skin/Subcutaneous Tissue Debridement with a total area of 16 sq cm performed by Ricard Dillon., MD. With the following instrument(s): Curette to remove Viable and Non-Viable tissue/material. Material removed includes Subcutaneous Tissue and Slough and. No specimens were  taken. A time out was conducted at 11:15, prior to the start of the procedure. A Minimum amount of bleeding was controlled with Pressure. The procedure was tolerated well with a pain level of 3 throughout and a pain level of 0 following the procedure. Post Debridement Measurements: 18cm length x 9.5cm width x 0.1cm depth; 13.43cm^3 volume. Character of Wound/Ulcer Post Debridement is improved. Severity of Tissue Post Debridement is: Fat layer exposed. Post procedure Diagnosis Wound #2: Same as Pre-Procedure Plan Follow-up Appointments: Return Appointment in 2 weeks. Dressing Change Frequency: Wound #2 Right Lower Leg: Change Dressing every other day. Wound #4 Left,Medial Malleolus: Change Dressing every other day. Skin Barriers/Peri-Wound Care: Wound #4 Left,Medial Malleolus: Moisturizing lotion - both legs TCA Cream or Ointment - both legs - mixed with lotion Wound Cleansing: Wound #2 Right Lower Leg: May shower and wash wound with soap and water. - on days that dressing is changed Wound #4 Left,Medial Malleolus: May shower and wash wound with soap and water. - on days that dressing is changed Primary Wound Dressing: Wound #2 Right Lower Leg: Calcium Alginate with Silver Wound #4 Left,Medial Malleolus: Calcium Alginate with Silver Secondary Dressing: Wound #2 Right Lower Leg: Dry Gauze ABD pad Wound #4 Left,Medial Malleolus: Dry Gauze ABD pad Edema Control: Kerlix and Coban - Bilateral Avoid standing for long periods of time Elevate legs to the level of the heart or above for 30 minutes daily and/or when sitting, a frequency of: - throughout the day Off-Loading: Turn and reposition every 2 hours 1. The patient's substantial areas on the right look a lot better. Some of these eschared areas have not resulted in underlying wounds. 2. I did communicate with Dr. Gwenlyn Saran. I still am concerned about his macrovascular status also potentially microvascular/vasculopathy of some  description. Unfortunately his appointment was delayed until 11/23. 3. I suppose the simplest explanation for this patient's presentation was cellulitis on top of macrovascular disease and venous insufficiency however the pattern of this did not really look like what I was supposed to see. My impression was that this represented some form of arterial insufficiency problem either in a macrovascular or small vessel level. I communicated this with Dr. Donzetta Matters. 4. No current evidence of cellulitis 5. I am  using triamcinolone with some steroid to attempt to get the erythema and the dry skin under control in his bilateral lower legs. 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 [name] Primary Care Provider: Haynes Hoehn Other Clinician: Referring Provider: Treating Provider/Extender:Syrus Nakama, [name], [name]: 2 Diagnosis Coding ICD-10 Codes Code Description [phone] Chronic venous hypertension (idiopathic) with ulcer and inflammation of bilateral lower extremity L97.818 Non-pressure chronic ulcer of other part of right lower leg with other specified severity L97.518 Non-pressure chronic ulcer of other part of right foot with other specified severity L97.328 Non-pressure chronic ulcer of left ankle with other specified severity Facility Procedures The patient participates with Medicare or their insurance follows the Medicare Facility Guidelines: CPT4 Code Description Modifier Quantity [id number] - DEB SUBQ TISSUE 20 SQ CM/< 1 ICD-10 Diagnosis Description L97.818 Non-pressure chronic  ulcer of  other part of right lower leg with other specified severity Physician Procedures CPT4 Code Description: [id number] [id number] - WC PHYS SUBQ TISS 20 SQ CM ICD-10 Diagnosis Description L97.818 Non-pressure chronic ulcer of other part of right lower leg severity Modifier: with other speci Quantity: 1 fied Electronic Signature(s) Signed: 12/26/2018 5:46:17 PM By: Linton Ham MD Entered By: Linton Ham on 12/26/2018 12:05:06

## 2018-12-29 NOTE — Progress Notes (Signed)
[name], [name] ([id number]) Visit Report for 12/26/2018 Arrival Information Details Patient Name: Date of Service: [name] 12/26/2018 10:00 AM Medical Record Number:[id number] Patient Account Number: [id number] Date of Birth/Sex: Treating RN: 07-08-1935 (83 y.o. Male) Kela Millin Primary Care Dom Haverland: Haynes Hoehn Other Clinician: Referring Marina Boerner: Treating Jacqueli Pangallo/Extender:[name], [name], [name]: 2 Visit Information History Since Last Visit Added or deleted any medications: No Patient Arrived: Wheel Chair Any new allergies or adverse reactions: No Arrival Time: 10:35 Had a fall or experienced change in No Accompanied By: family member activities of daily living that may affect Transfer Assistance: EasyPivot risk of falls: Patient Lift Signs or symptoms of abuse/neglect since last No Patient Identification Verified: Yes visito Secondary Verification Process Yes Hospitalized since last visit: No Completed: Implantable device outside of the clinic excluding No Patient Requires Transmission-Based No cellular tissue based products placed in the center Precautions: since last visit: Patient Has Alerts: No Has Dressing in Place as Prescribed: Yes Pain Present Now: No Electronic Signature(s) Signed: 12/29/2018 6:05:31 PM By: Kela Millin Entered By: Kela Millin on 12/26/2018 10:35:50 -------------------------------------------------------------------------------- Encounter Discharge Information Details Patient Name: Date of Service: [name]. 12/26/2018 10:00 AM Medical Record Number:[id number] Patient Account Number: [id number] Date of Birth/Sex: Treating RN: 06/17/1935 (83 y.o. Male) Deon Pilling Primary Care Nusaybah Ivie: Haynes Hoehn Other Clinician: Referring Coco Sharpnack: Treating Shakita Keir/Extender:[name], [name], [name]: 2 Encounter Discharge Information Items Post Procedure  Vitals Discharge Condition: Stable Temperature (F): 97.8 Ambulatory Status: Wheelchair Pulse (bpm): 59 Discharge Destination: Home Respiratory Rate (breaths/min): 18 Transportation: Private Auto Blood Pressure (mmHg): 112/60 Accompanied By: wife Schedule Follow-up Appointment: Yes Clinical Summary of Care: Electronic Signature(s) Signed: 12/26/2018 5:37:24 PM By: Deon Pilling Entered By: Deon Pilling on 12/26/2018 12:13:44 -------------------------------------------------------------------------------- Lower Extremity Assessment Details Patient Name: Date of Service: [name], [name] 12/26/2018 10:00 AM Medical Record Number:[id number] Patient Account Number: [id number] Date of Birth/Sex: Treating RN: 26-Jan-1936 (83 y.o. Male) Kela Millin Primary Care Kathalene Sporer: Haynes Hoehn Other Clinician: Referring Chrles Selley: Treating Carleena Mires/Extender:[name], [name], [name]: 2 Edema Assessment Assessed: [Left: No] [Right: No] Edema: [Left: Yes] [Right: Yes] Calf Left: Right: Point of Measurement: 45 cm From Medial Instep 37 cm 38 cm Ankle Left: Right: Point of Measurement: 11 cm From Medial Instep 27 cm 24 cm Vascular Assessment Pulses: Dorsalis Pedis Palpable: [Left:Yes] [Right:Yes] Electronic Signature(s) Signed: 12/29/2018 6:05:31 PM By: Kela Millin Entered By: Kela Millin on 12/26/2018 10:37:21 -------------------------------------------------------------------------------- Multi Wound Chart Details Patient Name: Date of Service: [name]. 12/26/2018 10:00 AM Medical Record Number:[id number] Patient Account Number: [id number] Date of Birth/Sex: Treating RN: 10-25-1935 (83 y.o. Male) Levan Hurst Primary Care Christop Hippert: Haynes Hoehn Other Clinician: Referring Maxxon Schwanke: Treating Ashtyn Freilich/Extender:[name], [name], [name]: 2 Vital Signs Height(in): 3 Pulse(bpm): 59 Weight(lbs): 62 Blood Pressure(mmHg):  112/60 Body Mass Index(BMI): 28 Temperature(F): 97.8 Respiratory 18 Rate(breaths/min): Photos: [1:No Photos] [2:No Photos] [3:No Photos] Wound Location: [1:Right Knee] [2:Right Lower Leg] [3:Left Knee] Wounding Event: [1:Trauma] [2:Gradually Appeared] [3:Trauma] Primary Etiology: [1:Trauma, Other] [2:Venous Leg Ulcer] [3:Trauma, Other] Comorbid History: [1:Chronic Obstructive Pulmonary Disease (COPD), Coronary Artery (COPD), Coronary Artery (COPD), Coronary Artery Disease, Hypertension, Peripheral Venous Disease, Peripheral Venous Disease, Peripheral Venous Disease, Received  Chemotherapy, Received Chemotherapy, Received Chemotherapy, Received Radiation] [2:Chronic Obstructive Pulmonary Disease Disease, Hypertension, Received Radiation] [3:Chronic Obstructive Pulmonary Disease Disease, Hypertension, Received Radiation] Date Acquired: [1:11/10/2018] [2:03/12/2018] [3:11/10/2018] Weeks of Treatment: [1:2] [2:2] [3:2] Wound Status: [1:Healed - Epithelialized] [2:Open] [3:Healed - Epithelialized] Clustered Wound: [1:No] [2:Yes] [3:No]  Measurements L x W x D 0x0x0 [2:18x9.5x0.1] [3:0x0x0] (cm) Area (cm) : [1:0] [2:134.303] [3:0] Volume (cm) : [1:0] [2:13.43] [3:0] % Reduction in Area: [1:100.00%] [2:60.00%] [3:100.00%] % Reduction in Volume: 100.00% [2:80.00%] [3:100.00%] Classification: [1:Full Thickness Without Exposed Support Structures Exposed Support Structures Exposed Support Structures] [2:Full Thickness Without] [3:Full Thickness Without] Exudate Amount: [1:None Present] [2:Small] [3:None Present] Exudate Type: [1:N/A] [2:Serosanguineous] [3:N/A] Exudate Color: [1:N/A] [2:red, brown] [3:N/A] Wound Margin: [1:Flat and Intact] [2:Distinct, outline attached Flat and Intact] Granulation Amount: [1:None Present (0%)] [2:Medium (34-66%)] [3:None Present (0%)] Granulation Quality: [1:N/A] [2:Red, Hyper-granulation] [3:N/A] Necrotic Amount: [1:None Present (0%)] [2:Medium (34-66%)] [3:None  Present (0%)] Necrotic Tissue: [1:N/A] [2:Eschar, Adherent Slough N/A] Exposed Structures: [1:Fascia: No Fat Layer (Subcutaneous Tissue) Exposed: Yes Tissue) Exposed: No Tendon: No Muscle: No Joint: No Bone: No] [2:Fat Layer (Subcutaneous Fascia: No Fascia: No Tendon: No Muscle: No Joint: No Bone: No] [3:Fat Layer (Subcutaneous Tissue)  Exposed: No Tendon: No Muscle: No Joint: No Bone: No] Epithelialization: [1:Large (67-100%)] [2:Small (1-33%)] [3:Large (67-100%)] Debridement: [1:N/A] [2:Debridement - Excisional N/A] Pre-procedure [1:N/A] [2:11:15] [3:N/A] Verification/Time Out Taken: Tissue Debrided: [1:N/A] [2:Subcutaneous, Slough] [3:N/A] Level: [1:N/A] [2:Skin/Subcutaneous Tissue N/A] Debridement Area (sq cm):N/A [2:16] [3:N/A] Instrument: [1:N/A] [2:Curette] [3:N/A] Bleeding: [1:N/A] [2:Minimum] [3:N/A] Hemostasis Achieved: [1:N/A] [2:Pressure] [3:N/A] Procedural Pain: [1:N/A] [2:3] [3:N/A] Post Procedural Pain: [1:N/A] [2:0] [3:N/A] Debridement Treatment [1:N/A] [2:Procedure was tolerated] [3:N/A] Response: [2:well] Post Debridement [1:N/A] [2:18x9.5x0.1] [3:N/A] Measurements L x W x D (cm) Post Debridement [1:N/A] [2:13.43] [3:N/A] Volume: (cm) Procedures Performed: [1:N/A] [2:Debridement 4 N/A] [3:N/A N/A] Photos: [1:No Photos] [2:N/A] [3:N/A] Wound Location: [1:Left Malleolus - Medial] [2:N/A] [3:N/A] Wounding Event: [1:Gradually Appeared] [2:N/A] [3:N/A] Primary Etiology: [1:Venous Leg Ulcer] [2:N/A] [3:N/A] Comorbid History: [1:Chronic Obstructive Pulmonary Disease (COPD), Coronary Artery Disease, Hypertension, Peripheral Venous Disease, Received Chemotherapy, Received Radiation] [2:N/A] [3:N/A] Date Acquired: [1:03/12/2018] [2:N/A] [3:N/A] Weeks of Treatment: [1:2] [2:N/A] [3:N/A] Wound Status: [1:Open] [2:N/A] [3:N/A] Clustered Wound: [1:No] [2:N/A] [3:N/A] Measurements L x W x D 2x4.9x0.1 [2:N/A] [3:N/A] (cm) Area (cm) : [1:7.697] [2:N/A] [3:N/A] Volume (cm) :  [1:0.77] [2:N/A] [3:N/A] % Reduction in Area: [1:-4.30%] [2:N/A] [3:N/A] % Reduction in Volume: 47.90% [2:N/A] [3:N/A] Classification: [1:Full Thickness Without Exposed Support Structures] [2:N/A] [3:N/A] Exudate Amount: [1:Medium] [2:N/A] [3:N/A] Exudate Type: [1:Serosanguineous] [2:N/A] [3:N/A] Exudate Color: [1:red, brown] [2:N/A] [3:N/A] Wound Margin: [1:Distinct, outline attached N/A] [3:N/A] Granulation Amount: [1:Medium (34-66%)] [2:N/A] [3:N/A] Granulation Quality: [1:Red, Pink, Hyper- granulation] [2:N/A] [3:N/A] Necrotic Amount: [1:Medium (34-66%)] [2:N/A] [3:N/A] Necrotic Tissue: [1:Adherent Slough] [2:N/A] [3:N/A] Exposed Structures: [1:Fat Layer (Subcutaneous N/A Tissue) Exposed: Yes Fascia: No Tendon: No Muscle: No Joint: No Bone: No] [3:N/A] Epithelialization: [1:None] [2:N/A] [3:N/A] Debridement: [1:N/A] [2:N/A] [3:N/A] Tissue Debrided: [1:N/A] [2:N/A] [3:N/A] Level: [1:N/A] [2:N/A] [3:N/A] Debridement Area (sq cm):N/A [2:N/A] [3:N/A] Instrument: [1:N/A] [2:N/A] [3:N/A] Bleeding: [1:N/A] [2:N/A] [3:N/A] Hemostasis Achieved: [1:N/A] [2:N/A] [3:N/A] Procedural Pain: [1:N/A] [2:N/A] [3:N/A] Post Procedural Pain: [1:N/A] [2:N/A] [3:N/A] Debridement Treatment [1:N/A] [2:N/A] [3:N/A] Response: Post Debridement [1:N/A] [2:N/A] [3:N/A] Measurements L x W x D (cm) Post Debridement [1:N/A] [2:N/A] [3:N/A] Volume: (cm) Procedures Performed: [1:N/A] [2:N/A] [3:N/A] Treatment Notes Electronic Signature(s) Signed: 12/26/2018 5:46:17 PM By: Linton Ham MD Signed: 12/26/2018 5:59:11 PM By: Levan Hurst RN, BSN Entered By: Linton Ham on 12/26/2018 11:59:28 -------------------------------------------------------------------------------- Multi-Disciplinary Care Plan Details Patient Name: Date of Service: [name]. 12/26/2018 10:00 AM Medical Record Number:[id number] Patient Account Number: [id number] Date of Birth/Sex: Treating RN: September 24, 1935 (83 y.o. Male)  Levan Hurst Primary Care Francile Woolford: Haynes Hoehn Other Clinician: Referring Edison Wollschlager: Treating Brylea Pita/Extender:[name], Legrand Como  WEEKS, [name]: 2 Active Inactive Venous Leg Ulcer Nursing Diagnoses: Knowledge deficit related to disease process and management Potential for venous Insuffiency (use before diagnosis confirmed) Goals: Patient/caregiver will verbalize understanding of disease process and disease management Date Initiated: 12/12/2018 Target Resolution Date: 01/13/2019 Goal Status: Active Verify adequate tissue perfusion prior to therapeutic compression application Date Initiated: 12/12/2018 Target Resolution Date: 01/13/2019 Goal Status: Active Interventions: Compression as ordered Provide education on venous insufficiency Notes: Wound/Skin Impairment Nursing Diagnoses: Impaired tissue integrity Knowledge deficit related to smoking impact on wound healing Knowledge deficit related to ulceration/compromised skin integrity Goals: Ulcer/skin breakdown will have a volume reduction of 30% by week 4 Date Initiated: 12/12/2018 Target Resolution Date: 01/13/2019 Goal Status: Active Interventions: Provide education on ulcer and skin care Notes: Electronic Signature(s) Signed: 12/26/2018 5:59:11 PM By: Levan Hurst RN, BSN Entered By: Levan Hurst on 12/26/2018 11:15:03 -------------------------------------------------------------------------------- Pain Assessment Details Patient Name: Date of Service: [name] 12/26/2018 10:00 AM Medical Record Number:[id number] Patient Account Number: [id number] Date of Birth/Sex: Treating RN: 1935-02-20 (83 y.o. Male) Kela Millin Primary Care Analicia Skibinski: Haynes Hoehn Other Clinician: Referring Ritamarie Arkin: Treating Arjun Hard/Extender:[name], [name], [name]: 2 Active Problems Location of Pain Severity and Description of Pain Patient Has Paino No Site Locations Pain Management  and Medication Current Pain Management: Electronic Signature(s) Signed: 12/29/2018 6:05:31 PM By: Kela Millin Entered By: Kela Millin on 12/26/2018 10:36:23 -------------------------------------------------------------------------------- Patient/Caregiver Education Details Patient Name: [name] 11/16/2020andnbsp10:00 Date of Service: AM Medical Record [id number] Number: Patient Account Number: [id number] Treating RN: Date of Birth/Gender: 1935-07-28 (83 y.o. Levan Hurst Male) Other Clinician: Primary Care Physician:WEEKS, SUSAN Treating Linton Ham Referring Physician: Physician/Extender: Jacqulyn Ducking in Treatment: 2 Education Assessment Education Provided To: Patient Education Topics Provided Venous: Methods: Explain/Verbal Responses: State content correctly Wound/Skin Impairment: Methods: Explain/Verbal Responses: State content correctly Electronic Signature(s) Signed: 12/26/2018 5:59:11 PM By: Levan Hurst RN, BSN Entered By: Levan Hurst on 12/26/2018 11:15:18 -------------------------------------------------------------------------------- Wound Assessment Details Patient Name: Date of Service: [name], [name] 12/26/2018 10:00 AM Medical Record Number:[id number] Patient Account Number: [id number] Date of Birth/Sex: Treating RN: 09/21/1935 (83 y.o. Male) Kela Millin Primary Care Felise Georgia: Haynes Hoehn Other Clinician: Referring Alain Deschene: Treating Platon Arocho/Extender:[name], [name], [name]: 2 Wound Status Wound Number: 1 Primary Trauma, Other Etiology: Etiology: Wound Location: Right Knee Wound Healed - Epithelialized Wounding Event: Trauma Status: Date Acquired: 11/10/2018 Comorbid Chronic Obstructive Pulmonary Disease Weeks Of Treatment: 2 History: (COPD), Coronary Artery Disease, Clustered Wound: No Hypertension, Peripheral Venous Disease, Received Chemotherapy, Received  Radiation Photos Wound Measurements Length: (cm) 0 % Reduct Width: (cm) 0 % Reduct Depth: (cm) 0 Epitheli Area: (cm) 0 Tunneli Volume: (cm) 0 Undermi Wound Description Classification: Full Thickness Without Exposed Support Foul Od Structures Slough/ Wound Flat and Intact Margin: Exudate None Present Amount: Wound Bed Granulation Amount: None Present (0%) Necrotic Amount: None Present (0%) Fascia E Fat Laye Tendon E Muscle E Joint Ex Bone Exp or After Cleansing: No Fibrino No Exposed Structure xposed: No r (Subcutaneous Tissue) Exposed: No xposed: No xposed: No posed: No osed: No ion in Area: 100% ion in Volume: 100% alization: Large (67-100%) ng: No ning: No Electronic Signature(s) Signed: 12/27/2018 4:00:22 PM By: Mikeal Hawthorne EMT/HBOT Signed: 12/29/2018 6:05:31 PM By: Kela Millin Entered By: Mikeal Hawthorne on 12/26/2018 14:21:35 -------------------------------------------------------------------------------- Wound Assessment Details Patient Name: Date of Service: [name], [name] 12/26/2018 10:00 AM Medical Record Number:[id number] Patient Account Number: [id number] Date of Birth/Sex: Treating RN: [birth year] (83 y.o.  Male) Kela Millin Primary Care Kaizen Ibsen: Other Clinician: Haynes Hoehn Referring Naoki Migliaccio: Treating Addasyn Mcbreen/Extender:[name], [name], [name]: 2 Wound Status Wound Number: 2 Primary Venous Leg Ulcer Etiology: Wound Location: Right Lower Leg Wound Open Wounding Event: Gradually Appeared Status: Date Acquired: 03/12/2018 Comorbid Chronic Obstructive Pulmonary Disease Weeks Of Treatment: 2 History: (COPD), Coronary Artery Disease, Clustered Wound: Yes Hypertension, Peripheral Venous Disease, Received Chemotherapy, Received Radiation Photos Wound Measurements Length: (cm) 18 % Reduct Width: (cm) 9.5 % Reduct Depth: (cm) 0.1 Epitheli Area: (cm) 134.303 Tunneli Volume: (cm) 13.43 Undermi Wound  Description Classification: Full Thickness Without Exposed Support Foul Od Structures Slough/ Wound Distinct, outline attached Margin: Exudate Small Amount: Exudate Serosanguineous Type: Exudate red, brown Color: Wound Bed Granulation Amount: Medium (34-66%) Granulation Quality: Red, Hyper-granulation Fascia E Necrotic Amount: Medium (34-66%) Fat Laye Necrotic Quality: Eschar, Adherent Slough Tendon E Muscle E Joint Ex Bone Exp or After Cleansing: No Fibrino Yes Exposed Structure xposed: No r (Subcutaneous Tissue) Exposed: Yes xposed: No xposed: No posed: No osed: No ion in Area: 60% ion in Volume: 80% alization: Small (1-33%) ng: No ning: No Treatment Notes Wound #2 (Right Lower Leg) 1. Cleanse With Wound Cleanser Soap and water 2. Periwound Care Moisturizing lotion TCA Cream 3. Primary Dressing Applied Calcium Alginate Ag 4. Secondary Dressing ABD Pad Dry Gauze 6. Support Layer Applied Kerlix/Coban Notes netting. Electronic Signature(s) Signed: 12/27/2018 4:00:22 PM By: Mikeal Hawthorne EMT/HBOT Signed: 12/29/2018 6:05:31 PM By: Kela Millin Entered By: Mikeal Hawthorne on 12/26/2018 14:22:20 -------------------------------------------------------------------------------- Wound Assessment Details Patient Name: Date of Service: [name], [name] 12/26/2018 10:00 AM Medical Record Number:[id number] Patient Account Number: [id number] Date of Birth/Sex: Treating RN: [birth year] (83 y.o. Male) Kela Millin Primary Care Danthony Kendrix: Haynes Hoehn Other Clinician: Referring Brayan Votaw: Treating Charie Pinkus/Extender:[name], [name], [name]: 2 Wound Status Wound Number: 3 Primary Trauma, Other Etiology: Wound Location: Left Knee Wound Healed - Epithelialized Wounding Event: Trauma Status: Date Acquired: 11/10/2018 Comorbid Chronic Obstructive Pulmonary Disease Weeks Of Treatment: 2 History: (COPD), Coronary Artery  Disease, Clustered Wound: No Hypertension, Peripheral Venous Disease, Received Chemotherapy, Received Radiation Photos Wound Measurements Length: (cm) 0 % Reduction Width: (cm) 0 % Reduction Depth: (cm) 0 Epithelializ Area: (cm) 0 Tunneli Volume: (cm) 0 Undermi Wound Description Classification: Full Thickness Without Exposed Support Foul Odo Structures Slough/F Wound Flat and Intact Margin: Exudate None Present Amount: Wound Bed Granulation Amount: None Present (0%) Necrotic Amount: None Present (0%) Fascia E Fat Laye Tendon E Muscle E Joint Ex Bone Exp r After Cleansing: No ibrino No Exposed Structure xposed: No r (Subcutaneous Tissue) Exposed: No xposed: No xposed: No posed: No osed: No in Area: 100% in Volume: 100% ation: Large (67-100%) ng: No ning: No Electronic Signature(s) Signed: 12/27/2018 4:00:22 PM By: Mikeal Hawthorne EMT/HBOT Signed: 12/29/2018 6:05:31 PM By: Kela Millin Entered By: Mikeal Hawthorne on 12/26/2018 14:21:11 -------------------------------------------------------------------------------- Wound Assessment Details Patient Name: Date of Service: [name], [name] 12/26/2018 10:00 AM Medical Record Number:[id number] Patient Account Number: [id number] Date of Birth/Sex: Treating RN: 06-29-1935 (83 y.o. Male) Kela Millin Primary Care Deby Adger: Haynes Hoehn Other Clinician: Referring Fancy Dunkley: Treating Francys Bolin/Extender:[name], [name], [name]: 2 Wound Status Wound Number: 4 Primary Venous Leg Ulcer Etiology: Wound Location: Left Malleolus - Medial Wound Open Wounding Event: Gradually Appeared Status: Date Acquired: 03/12/2018 Comorbid Chronic Obstructive Pulmonary Disease Weeks Of Treatment: 2 History: (COPD), Coronary Artery Disease, Clustered Wound: No Hypertension, Peripheral Venous Disease, Received Chemotherapy, Received Radiation Photos Wound Measurements Length: (cm) 2 %  Reduct Width: (cm)  4.9 % Reduct Depth: (cm) 0.1 Epitheli Area: (cm) 7.697 Tunneli Volume: (cm) 0.77 Undermi Wound Description Classification: Full Thickness Without Exposed Support Foul Odo Structures Slough/F Wound Distinct, outline attached Margin: Exudate Medium Amount: Exudate Serosanguineous Type: Exudate red, brown Color: Wound Bed Granulation Amount: Medium (34-66%) Granulation Quality: Red, Pink, Hyper-granulation Fascia E Necrotic Amount: Medium (34-66%) Fat Laye Necrotic Quality: Adherent Slough Tendon E Muscle E Joint Ex Bone Exp r After Cleansing: No ibrino Yes Exposed Structure xposed: No r (Subcutaneous Tissue) Exposed: Yes xposed: No xposed: No posed: No osed: No ion in Area: -4.3% ion in Volume: 47.9% alization: None ng: No ning: No Treatment Notes Wound #4 (Left, Medial Malleolus) 1. Cleanse With Wound Cleanser Soap and water 2. Periwound Care Moisturizing lotion TCA Cream 3. Primary Dressing Applied Calcium Alginate Ag 4. Secondary Dressing ABD Pad Dry Gauze 6. Support Layer Applied Kerlix/Coban Notes netting. Electronic Signature(s) Signed: 12/27/2018 4:00:22 PM By: Mikeal Hawthorne EMT/HBOT Signed: 12/29/2018 6:05:31 PM By: Kela Millin Entered By: Mikeal Hawthorne on 12/26/2018 14:21:57 -------------------------------------------------------------------------------- Vitals Details Patient Name: Date of Service: [name], [name] 12/26/2018 10:00 AM Medical Record Number:[id number] Patient Account Number: [id number] Date of Birth/Sex: Treating RN: 10-22-1935 (83 y.o. Male) Kela Millin Primary Care Kandace Elrod: Haynes Hoehn Other Clinician: Referring Jeralynn Vaquera: Treating Taqwa Deem/Extender:[name], [name], [name]: 2 Vital Signs Time Taken: 10:30 Temperature (F): 97.8 Height (in): 76 Pulse (bpm): 59 Weight (lbs): 227 Respiratory Rate (breaths/min): 18 Body Mass Index (BMI): 27.6 Blood Pressure (mmHg):  112/60 Reference Range: 80 - 120 mg / dl Electronic Signature(s) Signed: 12/29/2018 6:05:31 PM By: Kela Millin Entered By: Kela Millin on 12/26/2018 10:36:16

## 2019-01-01 NOTE — Progress Notes (Signed)
VASCULAR [facility]ISTS           OF [place]  History and Physical   SELAH BALDRIDGE is a 83 y.o. (02-17-1935) male who presents with hx of BLE lymphedema and venous stasis ulcer and was admitted 11/12/2018 for management of BLE cellulitis and had broad spectrum abx.  Pt was seen by Dr. Sharol Given & recommended compression but these stuck to the pt's skin that caused damage per pt's wife.  Pt was discharged to SNF and switched to oral abx prior to discharge.  He has hx of chronic BLE venous insufficiency/lymphedema.   He is being seen at the wound care center and had debridement of the RLE on 12/26/2018.  ABI's in the wound clinic by Dr. Dellia Nims were 0.74 on the right and 0.61 on the left on 12/26/2018.   At that visit, it is reported that the wounds on the right lateral leg are looking better.  On the left leg, he had an area of the medial malleolus extending into the medial heel.    During his visit today, his wife states that he has been having issues with his legs for about 5-6 years.  She states that he had leg weakness, which is what was the precipitating factor to his hospitalization.  She states that they look the best today that they have looked in quite some time.  He continues to have the right lateral wound and the left medial malleolus wound present.  (unable to put pictures in the chart as the app was down). He denies any pain in his legs except his knees.  He denies any new swelling in either leg.    Pt has COPD and has required O2 in the past.  When hospitalized, he did have a CTA of the chest to r/o PE but showed possible RLL PNA.  This was negative for PE at that time.  CT also revealed a 1.6cm RUL sub-solid nodule possibly infectious/inflammatory and recommend f/u CT at 6-12 months to confirm resolution.   His echo revealed EF of 50-55%.   He was started on short dose of prednisone and inhalers.  They inquire about a referral to a pulmonologist.  He denies any new, sudden  onset of shortness of breath.   He also had BLE weakness and MRI was obtained and revealed subacute L4 fx, neral foraminal stenosis, large osteophyte at L5-S1.  Pt did not have back pain.    Pt has hx of AAA of 3.1cm followed by Dr. Percival Spanish.  He has hx of CAD and being treated medically with aggressive risk reduction and meds.    The pt is on a statin for cholesterol management.  The pt is on a daily aspirin.   Other AC:  none The pt is on BB, CCB, ACEI for hypertension.   The pt is not diabetic.   Tobacco hx:  remote  Past Medical History:  Diagnosis Date  . Arthritis   . CAD (coronary artery disease)    Abnormal stress perfusion study with apical and inferior wall ischemia. LAD had a long 99% stenosis followed by 30% stenosis, first diagonal 25% stenosis, circumflex luminal irregularities, second obtuse marginal 25% stenosis, RCA 40% followed by 40% stenosis. EF 60%. Stenting with 2 bare metal stents by Dr. Burt Knack to the LAD.  Marland Kitchen Colon cancer [facility])    Radiation, chemo, surgery 2011  . Complication of anesthesia    Pt states he coughs alot  during anesthesia  . History of tobacco use   . Hypertension     Past Surgical History:  Procedure Laterality Date  . CATARACT EXTRACTION W/PHACO Left 05/29/2016   Procedure: CATARACT EXTRACTION PHACO AND INTRAOCULAR LENS PLACEMENT (IOC);  Surgeon: Baruch Goldmann, MD;  Location: [facility];  Service: Ophthalmology;  Laterality: Left;  CDE: 10.39  . CATARACT EXTRACTION W/PHACO Right 06/12/2016   Procedure: CATARACT EXTRACTION PHACO AND INTRAOCULAR LENS PLACEMENT RIGHT EYE CDE= 13.59;  Surgeon: Baruch Goldmann, MD;  Location: [facility];  Service: Ophthalmology;  Laterality: Right;  right  . CORONARY ANGIOPLASTY WITH STENT PLACEMENT  2008   x 2  . HEMICOLECTOMY  01/10/2010  . INGUINAL HERNIA REPAIR  2012  . PORTACATH PLACEMENT  2011  . portacath removal  2011  . UMBILICAL HERNIA REPAIR  01/2008   left    Social History   Socioeconomic History  .  Marital status: Married    Spouse name: Not on file  . Number of children: Not on file  . Years of education: Not on file  . Highest education level: Not on file  Occupational History  . Occupation: Retired  Scientific laboratory technician  . Financial resource strain: Not on file  . Food insecurity    Worry: Not on file    Inability: Not on file  . Transportation needs    Medical: Not on file    Non-medical: Not on file  Tobacco Use  . Smoking status: Former Smoker    Packs/day: 0.50    Years: 57.00    Pack years: 28.50    Quit date: 2006    Years since quitting: 14.9  . Smokeless tobacco: Never Used  . Tobacco comment: Used to be a 3 pack per day smoker  Substance and Sexual Activity  . Alcohol use: No  . Drug use: No  . Sexual activity: Yes  Lifestyle  . Physical activity    Days per week: Not on file    Minutes per session: Not on file  . Stress: Not on file  Relationships  . Social Herbalist on phone: Not on file    Gets together: Not on file    Attends religious service: Not on file    Active member of club or organization: Not on file    Attends meetings of clubs or organizations: Not on file    Relationship status: Not on file  . Intimate partner violence    Fear of current or ex partner: Not on file    Emotionally abused: Not on file    Physically abused: Not on file    Forced sexual activity: Not on file  Other Topics Concern  . Not on file  Social History Narrative   Married with 3 children and 2 grandchildren     Family History  Adopted: Yes    Current Outpatient Medications  Medication Sig Dispense Refill  . albuterol (VENTOLIN HFA) 108 (90 Base) MCG/ACT inhaler Inhale 2 puffs into the lungs every 6 (six) hours as needed for wheezing or shortness of breath. 6.7 g 0  . amLODipine-benazepril (LOTREL) 10-40 MG per capsule Take 1 capsule by mouth daily. (Patient taking differently: Take 1 capsule by mouth at bedtime. ) 90 capsule 1  . aspirin 81 MG  chewable tablet Chew 81 mg by mouth every evening.    Marland Kitchen atorvastatin (LIPITOR) 20 MG tablet Take 1 tablet (20 mg total) by mouth every evening.    . Ferrous Gluconate (IRON)  240 (27 FE) MG TABS Take 240 mg by mouth daily.     Marland Kitchen guaiFENesin-dextromethorphan (ROBITUSSIN DM) 100-10 MG/5ML syrup Take 10 mLs by mouth every 6 (six) hours as needed for cough. 118 mL 0  . Lycopene 10 MG CAPS Take 10 mg by mouth daily.    . metoprolol tartrate (LOPRESSOR) 100 MG tablet Take 1 tablet (100 mg total) by mouth at bedtime.    . mometasone-formoterol (DULERA) 100-5 MCG/ACT AERO Inhale 2 puffs into the lungs 2 (two) times daily. 8.8 g 0  . nitroGLYCERIN (NITROSTAT) 0.4 MG SL tablet Place 1 tablet (0.4 mg total) under the tongue every 5 (five) minutes as needed. (Patient taking differently: Place 0.4 mg under the tongue every 5 (five) minutes as needed for chest pain. ) 25 tablet 3  . Vitamin D, Cholecalciferol, 1000 UNITS TABS Take 1,000 Units by mouth 2 (two) times daily.     No current facility-administered medications for this visit.     Allergies  Allergen Reactions  . Sulfa Antibiotics Other (See Comments)    Didn't feel right    REVIEW OF SYSTEMS:   [X]  denotes positive finding, [ ]  denotes negative finding Cardiac  Comments:  Chest pain or chest pressure:    Shortness of breath upon exertion: x   Short of breath when lying flat:    Irregular heart rhythm:        Vascular    Pain in calf, thigh, or hip brought on by ambulation:    Pain in feet at night that wakes you up from your sleep:     Blood clot in your veins:    Leg swelling:  x       Pulmonary    Oxygen at home: x   Productive cough:     Wheezing:         Neurologic    Sudden weakness in arms or legs:  x   Sudden numbness in arms or legs:     Sudden onset of difficulty speaking or slurred speech:    Temporary loss of vision in one eye:     Problems with dizziness:         Gastrointestinal    Blood in stool:     Vomited  blood:         Genitourinary    Burning when urinating:     Blood in urine:        Psychiatric    Major depression:         Hematologic    Bleeding problems:    Problems with blood clotting too easily:        Skin    Rashes or ulcers: x       Constitutional    Fever or chills:      PHYSICAL EXAMINATION:  Today's Vitals   01/02/19 1452  BP: 113/62  Pulse: 96  Resp: 20  Temp: (!) 97.4 F (36.3 C)  SpO2: 96%  Weight: 246 lb (111.6 kg)  Height: 6\' 4"  (1.93 m)   Body mass index is 29.94 kg/m.   General:  WDWN in NAD; vital signs documented above Gait: Not observed HENT: WNL, normocephalic Pulmonary: normal non-labored breathing Cardiac: regular HR; without carotid bruits Abdomen: soft, NT, no masses Skin: without rashes Vascular Exam/Pulses:  Right Left  Femoral Unable to palpate due to sitting in WC  Unable to palpate due to sitting in WC   DP + monophasic doppler signal + monophasic doppler signal  PT +  monophasic doppler signal + monophasic doppler signal   Extremities:         PHOTOS ABOVE ARE FROM 11/12/2018.  UNABLE TO TAKE PICTURES TODAY DUE TO APP BEING DOWN.  Legs much improved today, but continues to have a non healing wound on the medial malleolus of the left leg, but is much improved from the above picture.  There is also a wound on the lateral aspect of the right ankle.   Musculoskeletal: no muscle wasting or atrophy  Neurologic: A&O X 3;  No focal weakness or paresthesias are detected Psychiatric:  The pt has Normal affect.   Non-Invasive Vascular Imaging:   Venous duplex on 01/02/2019: Summary: Right: Findings consistent with acute deep vein thrombosis involving the proximal right femoral vein. Right Profunda Vein not visualized due to edema and artery calcifications. Right PTV and PeroV not visualized due to bandages.   Left: There is no evidence of deep vein thrombosis in the lower extremity within the CFV, FV, and POPV. There is  no evidence of superficial venous thrombosis within the visualized GSV. Left SSV not visualized.  ABI's 12/26/2018 @ wound clinic: Right:  0.74 Left:  0.61   MICHALE FATULA is a 83 y.o. male who presents with: non healing wounds with mixed venous and arterial insufficiency.  -duplex today revealed acute DVT in the right femoral vein.  CT PE scan in October did not reveal any PE.  Pt  prescribed Lovenox bid at this time as he will need an arteriogram with BLE runoff and possible intervention next week by Dr. Donzetta Matters.  He has enough Lovenox through the procedure.  Once we determine his arterial status, we will discuss DOAC.  Pt does not want to be on coumadin.  Pt was discussed in detail with Dr. Donzetta Matters.  -discussed the above with the pt and his wife and they are in agreement to proceed.  -he currently resides at [facility] but will be going home tomorrow.  -pt CT scan in October revealed small 1.6cm RUL nodule with recommendations of repeating scan in 6-12 months.  Pt on home O2 and hx of COPD.  Will put in referral to Pulmonary for follow up.    Leontine Locket, [facility] 01/01/2019 1:57 PM  Clinic MD:  Discussed with Dr. Donzetta Matters

## 2019-01-02 ENCOUNTER — Other Ambulatory Visit: Payer: Self-pay | Admitting: *Deleted

## 2019-01-02 ENCOUNTER — Ambulatory Visit (INDEPENDENT_AMBULATORY_CARE_PROVIDER_SITE_OTHER): Payer: Medicare Other | Admitting: Physician Assistant

## 2019-01-02 ENCOUNTER — Encounter: Payer: Self-pay | Admitting: *Deleted

## 2019-01-02 ENCOUNTER — Other Ambulatory Visit: Payer: Self-pay

## 2019-01-02 ENCOUNTER — Ambulatory Visit (HOSPITAL_COMMUNITY)
Admission: RE | Admit: 2019-01-02 | Discharge: 2019-01-02 | Disposition: A | Discharge status: Home / Self Care | Payer: No Typology Code available for payment source | Source: Ambulatory Visit | Attending: Vascular Surgery | Admitting: Vascular Surgery

## 2019-01-02 VITALS — BP 113/62 | HR 96 | Temp 97.4°F | Resp 20 | Ht 76.0 in | Wt 246.0 lb

## 2019-01-02 DIAGNOSIS — I82411 Acute embolism and thrombosis of right femoral vein: Secondary | ICD-10-CM

## 2019-01-02 DIAGNOSIS — I2510 Atherosclerotic heart disease of native coronary artery without angina pectoris: Secondary | ICD-10-CM | POA: Diagnosis not present

## 2019-01-02 DIAGNOSIS — S81809A Unspecified open wound, unspecified lower leg, initial encounter: Secondary | ICD-10-CM

## 2019-01-02 DIAGNOSIS — I878 Other specified disorders of veins: Secondary | ICD-10-CM | POA: Insufficient documentation

## 2019-01-02 DIAGNOSIS — I82409 Acute embolism and thrombosis of unspecified deep veins of unspecified lower extremity: Secondary | ICD-10-CM

## 2019-01-02 MED ORDER — ENOXAPARIN SODIUM 150 MG/ML ~~LOC~~ SOLN: 1.0000 mg/kg | Freq: Two times a day (BID) | SUBCUTANEOUS | 0 refills | Status: DC

## 2019-01-03 ENCOUNTER — Other Ambulatory Visit: Payer: Self-pay | Admitting: *Deleted

## 2019-01-03 DIAGNOSIS — R911 Solitary pulmonary nodule: Secondary | ICD-10-CM

## 2019-01-06 ENCOUNTER — Other Ambulatory Visit (HOSPITAL_COMMUNITY)
Admission: RE | Admit: 2019-01-06 | Discharge: 2019-01-06 | Disposition: A | Discharge status: Home / Self Care | Payer: Medicare Other | Source: Ambulatory Visit | Attending: Vascular Surgery | Admitting: Vascular Surgery

## 2019-01-06 DIAGNOSIS — Z20828 Contact with and (suspected) exposure to other viral communicable diseases: Secondary | ICD-10-CM | POA: Diagnosis not present

## 2019-01-06 DIAGNOSIS — Z01812 Encounter for preprocedural laboratory examination: Secondary | ICD-10-CM | POA: Diagnosis present

## 2019-01-06 LAB — SARS CORONAVIRUS 2 (TAT 6-24 HRS): SARS Coronavirus 2: NEGATIVE

## 2019-01-09 ENCOUNTER — Encounter (HOSPITAL_COMMUNITY)
Admission: RE | Disposition: A | Discharge status: Home / Self Care | Payer: Self-pay | Source: Home / Self Care | Attending: Vascular Surgery

## 2019-01-09 ENCOUNTER — Other Ambulatory Visit: Payer: Self-pay

## 2019-01-09 ENCOUNTER — Ambulatory Visit (HOSPITAL_COMMUNITY)
Admission: RE | Admit: 2019-01-09 | Discharge: 2019-01-09 | Disposition: A | Discharge status: Home / Self Care | Payer: Medicare Other | Attending: Vascular Surgery | Admitting: Vascular Surgery

## 2019-01-09 ENCOUNTER — Ambulatory Visit (HOSPITAL_BASED_OUTPATIENT_CLINIC_OR_DEPARTMENT_OTHER): Payer: Medicare Other | Admitting: Internal Medicine

## 2019-01-09 DIAGNOSIS — I82411 Acute embolism and thrombosis of right femoral vein: Secondary | ICD-10-CM | POA: Insufficient documentation

## 2019-01-09 DIAGNOSIS — I739 Peripheral vascular disease, unspecified: Secondary | ICD-10-CM | POA: Diagnosis present

## 2019-01-09 DIAGNOSIS — I2510 Atherosclerotic heart disease of native coronary artery without angina pectoris: Secondary | ICD-10-CM | POA: Insufficient documentation

## 2019-01-09 DIAGNOSIS — M1990 Unspecified osteoarthritis, unspecified site: Secondary | ICD-10-CM | POA: Insufficient documentation

## 2019-01-09 DIAGNOSIS — Z79899 Other long term (current) drug therapy: Secondary | ICD-10-CM | POA: Diagnosis not present

## 2019-01-09 DIAGNOSIS — Z7982 Long term (current) use of aspirin: Secondary | ICD-10-CM | POA: Insufficient documentation

## 2019-01-09 DIAGNOSIS — J449 Chronic obstructive pulmonary disease, unspecified: Secondary | ICD-10-CM | POA: Insufficient documentation

## 2019-01-09 DIAGNOSIS — Z85038 Personal history of other malignant neoplasm of large intestine: Secondary | ICD-10-CM | POA: Diagnosis not present

## 2019-01-09 DIAGNOSIS — Z87891 Personal history of nicotine dependence: Secondary | ICD-10-CM | POA: Insufficient documentation

## 2019-01-09 DIAGNOSIS — I10 Essential (primary) hypertension: Secondary | ICD-10-CM | POA: Diagnosis not present

## 2019-01-09 DIAGNOSIS — I714 Abdominal aortic aneurysm, without rupture: Secondary | ICD-10-CM | POA: Insufficient documentation

## 2019-01-09 DIAGNOSIS — I878 Other specified disorders of veins: Secondary | ICD-10-CM | POA: Diagnosis not present

## 2019-01-09 HISTORY — PX: ABDOMINAL AORTOGRAM W/LOWER EXTREMITY: CATH118223

## 2019-01-09 LAB — POCT I-STAT, CHEM 8
BUN: 16 mg/dL (ref 8–23)
Calcium, Ion: 1.15 mmol/L (ref 1.15–1.40)
Chloride: 100 mmol/L (ref 98–111)
Creatinine, Ser: 0.8 mg/dL (ref 0.61–1.24)
Glucose, Bld: 97 mg/dL (ref 70–99)
HCT: 36 % — ABNORMAL LOW (ref 39.0–52.0)
Hemoglobin: 12.2 g/dL — ABNORMAL LOW (ref 13.0–17.0)
Potassium: 4.3 mmol/L (ref 3.5–5.1)
Sodium: 139 mmol/L (ref 135–145)
TCO2: 28 mmol/L (ref 22–32)

## 2019-01-09 SURGERY — ABDOMINAL AORTOGRAM W/LOWER EXTREMITY
Anesthesia: LOCAL

## 2019-01-09 MED ORDER — SODIUM CHLORIDE 0.9 % IV SOLN: 250.0000 mL | INTRAVENOUS | Status: DC | PRN

## 2019-01-09 MED ORDER — HEPARIN (PORCINE) IN NACL 1000-0.9 UT/500ML-% IV SOLN
INTRAVENOUS | Status: DC | PRN
  Administered 2019-01-09 (×2): 500 mL

## 2019-01-09 MED ORDER — SODIUM CHLORIDE 0.9% FLUSH: 3.0000 mL | Freq: Two times a day (BID) | INTRAVENOUS | Status: DC

## 2019-01-09 MED ORDER — LIDOCAINE HCL (PF) 1 % IJ SOLN
INTRAMUSCULAR | Status: AC
  Filled 2019-01-09: qty 60

## 2019-01-09 MED ORDER — MIDAZOLAM HCL 2 MG/2ML IJ SOLN
INTRAMUSCULAR | Status: DC | PRN
  Administered 2019-01-09: 1 mg via INTRAVENOUS

## 2019-01-09 MED ORDER — APIXABAN 5 MG PO TABS: ORAL_TABLET | ORAL | 2 refills | Status: DC

## 2019-01-09 MED ORDER — MORPHINE SULFATE (PF) 2 MG/ML IV SOLN: 2.0000 mg | INTRAVENOUS | Status: DC | PRN

## 2019-01-09 MED ORDER — HEPARIN (PORCINE) IN NACL 1000-0.9 UT/500ML-% IV SOLN
INTRAVENOUS | Status: AC
  Filled 2019-01-09: qty 1000

## 2019-01-09 MED ORDER — SODIUM CHLORIDE 0.9 % IV SOLN
INTRAVENOUS | Status: DC
  Administered 2019-01-09: 11:00:00 via INTRAVENOUS

## 2019-01-09 MED ORDER — SODIUM CHLORIDE 0.9% FLUSH: 3.0000 mL | INTRAVENOUS | Status: DC | PRN

## 2019-01-09 MED ORDER — LABETALOL HCL 5 MG/ML IV SOLN: 10.0000 mg | INTRAVENOUS | Status: DC | PRN

## 2019-01-09 MED ORDER — FENTANYL CITRATE (PF) 100 MCG/2ML IJ SOLN
INTRAMUSCULAR | Status: DC | PRN
  Administered 2019-01-09: 25 ug via INTRAVENOUS

## 2019-01-09 MED ORDER — MIDAZOLAM HCL 2 MG/2ML IJ SOLN
INTRAMUSCULAR | Status: AC
  Filled 2019-01-09: qty 2

## 2019-01-09 MED ORDER — SODIUM CHLORIDE 0.9 % IV SOLN: INTRAVENOUS | Status: DC

## 2019-01-09 MED ORDER — LIDOCAINE HCL (PF) 1 % IJ SOLN
INTRAMUSCULAR | Status: DC | PRN
  Administered 2019-01-09: 18 mL via INTRADERMAL

## 2019-01-09 MED ORDER — OXYCODONE HCL 5 MG PO TABS: 5.0000 mg | ORAL_TABLET | ORAL | Status: DC | PRN

## 2019-01-09 MED ORDER — ACETAMINOPHEN 325 MG PO TABS: 650.0000 mg | ORAL_TABLET | ORAL | Status: DC | PRN

## 2019-01-09 MED ORDER — IODIXANOL 320 MG/ML IV SOLN
INTRAVENOUS | Status: DC | PRN
  Administered 2019-01-09: 97 mL via INTRA_ARTERIAL

## 2019-01-09 MED ORDER — ONDANSETRON HCL 4 MG/2ML IJ SOLN: 4.0000 mg | Freq: Four times a day (QID) | INTRAMUSCULAR | Status: DC | PRN

## 2019-01-09 MED ORDER — FENTANYL CITRATE (PF) 100 MCG/2ML IJ SOLN
INTRAMUSCULAR | Status: AC
  Filled 2019-01-09: qty 2

## 2019-01-09 MED ORDER — APIXABAN 5 MG PO TABS: 5.0000 mg | ORAL_TABLET | Freq: Two times a day (BID) | ORAL | Status: DC

## 2019-01-09 MED ORDER — HYDRALAZINE HCL 20 MG/ML IJ SOLN: 5.0000 mg | INTRAMUSCULAR | Status: DC | PRN

## 2019-01-09 SURGICAL SUPPLY — 10 items
CATH OMNI FLUSH 5F 65CM (CATHETERS) ×2 IMPLANT
CLOSURE MYNX CONTROL 5F (Vascular Products) ×2 IMPLANT
KIT MICROPUNCTURE NIT STIFF (SHEATH) ×2 IMPLANT
KIT PV (KITS) ×2 IMPLANT
SHEATH PINNACLE 5F 10CM (SHEATH) ×2 IMPLANT
SHEATH PROBE COVER 6X72 (BAG) ×2 IMPLANT
SYR MEDRAD MARK V 150ML (SYRINGE) ×2 IMPLANT
TRANSDUCER W/STOPCOCK (MISCELLANEOUS) ×2 IMPLANT
TRAY PV CATH (CUSTOM PROCEDURE TRAY) ×2 IMPLANT
WIRE BENTSON .035X145CM (WIRE) ×2 IMPLANT

## 2019-01-09 NOTE — Op Note (Signed)
    Patient name: [name] MRN: [id number] DOB: [birth year] Sex: male  01/09/2019 Pre-operative Diagnosis: Bilateral lower extremity wounds Post-operative diagnosis:  Same Surgeon:  Erlene Quan C. Donzetta Matters, MD Procedure Performed: 1.  Ultrasound-guided cannulation left common femoral artery 2.  Aortogram with bilateral lower extremity runoff 3.  Minx device closure left common femoral artery 4.  Moderate sedation with fentanyl and Versed for 22 minutes  Indications: 83 year old male with bilateral extremity wounds.  He was recently found to have acute DVT in the right femoral vein.  He has ABI of 0.7 on the right and 0.6 on the left.  He is indicated for angiogram possible invention starting with the right side.  Findings: The aorta and iliac segments are quite tortuous but there is no flow-limiting stenosis.  Bilaterally he has SFA nonflow-limiting stenoses throughout heavily calcified.  He has two-vessel runoff bilaterally at least intertibial and posterior tibial which are quite large vessels as well.  He will eventually need right lower extremity venous reflux testing after his DVT treatment.   Procedure:  The patient was identified in the holding area and taken to room 8.  The patient was then placed supine on the table and prepped and draped in the usual sterile fashion.  A time out was called.  Ultrasound was used to evaluate the left common femoral artery which was noted to be patent compressible.  There is anesthetized 1% lidocaine cannulated with direct ultrasound visualization a micropuncture needle followed the wire and sheath.  And images saved the permanent record.  Moderate sedation was administered with fentanyl and Versed and his vital signs were monitored throughout the case.  A Bentson wire was placed followed by 5 [place] sheath.  Omni catheter is placed to level L1 and aortogram was performed.  We then performed bilateral lower extremity runoff with the above findings.  Catheter was  removed over wire.  A 5 French minx device was used to close the common femoral artery.  He tolerated procedure well without immediate complication.  Contrast: 97cc  Brandon C. Donzetta Matters, MD [facility] [place] Office: [phone] Pager: [phone]

## 2019-01-09 NOTE — H&P (Addendum)
   History and Physical Update  The patient was interviewed and re-examined.  The patient's previous History and Physical has been reviewed and is unchanged from recent office visit. Plan for aortogram with bilateral evaluation and possible intervention on the right today. lovenox has been held and will need restarted or change to oral anticoagulant on discharge.  Possible need intervention on the right today and left in the near future.  Tahje Borawski C. Donzetta Matters, MD [facility] of [place] Office: [phone] Pager: [phone]   01/09/2019, 10:07 AM

## 2019-01-09 NOTE — Discharge Instructions (Signed)
° °  [facility] of [place] ° °Discharge Instructions ° °Lower Extremity Angiogram; Angioplasty/Stenting ° °Please refer to the following instructions for your post-procedure care. Your surgeon or physician assistant will discuss any changes with you. ° °Activity ° °Avoid lifting more than 8 pounds (1 gallons of milk) for 72 hours (3 days) after your procedure. You may walk as much as you can tolerate. It's OK to drive after 72 hours. ° °Bathing/Showering ° °You may shower the day after your procedure. If you have a bandage, you may remove it at 24- 48 hours. Clean your incision site with mild soap and water. Pat the area dry with a clean towel. ° °Diet ° °Resume your pre-procedure diet. There are no special food restrictions following this procedure. All patients with peripheral vascular disease should follow a low fat/low cholesterol diet. In order to heal from your surgery, it is CRITICAL to get adequate nutrition. Your body requires vitamins, minerals, and protein. Vegetables are the best source of vitamins and minerals. Vegetables also provide the perfect balance of protein. Processed food has little nutritional value, so try to avoid this. ° °Medications ° °Resume taking all of your medications unless your doctor tells you not to. If your incision is causing pain, you may take over-the-counter pain relievers such as acetaminophen (Tylenol) ° °Follow Up ° °Follow up will be arranged at the time of your procedure. You may have an office visit scheduled or may be scheduled for surgery. Ask your surgeon if you have any questions. ° °Please call us immediately for any of the following conditions: °•Severe or worsening pain your legs or feet at rest or with walking. °•Increased pain, redness, drainage at your groin puncture site. °•Fever of 101 degrees or higher. °•If you have any mild or slow bleeding from your puncture site: lie down, apply firm constant pressure over the area with a piece of  gauze or a clean wash cloth for 30 minutes- no peeking!, call 911 right away if you are still bleeding after 30 minutes, or if the bleeding is heavy and unmanageable. ° °Reduce your risk factors of vascular disease: ° °Stop smoking. If you would like help call [facility] at 1-800-QUIT-NOW (1-800-784-8669) or [place] at 336-586-4000. °Manage your cholesterol °Maintain a desired weight °Control your diabetes °Keep your blood pressure down ° °If you have any questions, please call the office at [phone] ° °

## 2019-01-10 ENCOUNTER — Encounter (HOSPITAL_COMMUNITY): Payer: Self-pay | Admitting: Vascular Surgery

## 2019-01-12 ENCOUNTER — Other Ambulatory Visit: Payer: Self-pay

## 2019-01-12 ENCOUNTER — Encounter (HOSPITAL_BASED_OUTPATIENT_CLINIC_OR_DEPARTMENT_OTHER): Payer: Medicare Other | Attending: Internal Medicine | Admitting: Internal Medicine

## 2019-01-12 DIAGNOSIS — L97518 Non-pressure chronic ulcer of other part of right foot with other specified severity: Secondary | ICD-10-CM | POA: Diagnosis not present

## 2019-01-12 DIAGNOSIS — I82411 Acute embolism and thrombosis of right femoral vein: Secondary | ICD-10-CM | POA: Insufficient documentation

## 2019-01-12 DIAGNOSIS — N400 Enlarged prostate without lower urinary tract symptoms: Secondary | ICD-10-CM | POA: Diagnosis not present

## 2019-01-12 DIAGNOSIS — Z955 Presence of coronary angioplasty implant and graft: Secondary | ICD-10-CM | POA: Insufficient documentation

## 2019-01-12 DIAGNOSIS — L97818 Non-pressure chronic ulcer of other part of right lower leg with other specified severity: Secondary | ICD-10-CM | POA: Diagnosis not present

## 2019-01-12 DIAGNOSIS — I2510 Atherosclerotic heart disease of native coronary artery without angina pectoris: Secondary | ICD-10-CM | POA: Insufficient documentation

## 2019-01-12 DIAGNOSIS — Z85038 Personal history of other malignant neoplasm of large intestine: Secondary | ICD-10-CM | POA: Insufficient documentation

## 2019-01-12 DIAGNOSIS — Z7901 Long term (current) use of anticoagulants: Secondary | ICD-10-CM | POA: Insufficient documentation

## 2019-01-12 DIAGNOSIS — L97328 Non-pressure chronic ulcer of left ankle with other specified severity: Secondary | ICD-10-CM | POA: Diagnosis not present

## 2019-01-12 DIAGNOSIS — I10 Essential (primary) hypertension: Secondary | ICD-10-CM | POA: Diagnosis not present

## 2019-01-12 DIAGNOSIS — I87333 Chronic venous hypertension (idiopathic) with ulcer and inflammation of bilateral lower extremity: Secondary | ICD-10-CM | POA: Insufficient documentation

## 2019-01-13 NOTE — Progress Notes (Signed)
[name] (PK:[id number]) Visit Report for 01/12/2019 HPI Details Patient Name: Date of Service: [name], [name] 01/12/2019 11:15 AM Medical Record Number:[id number] Patient Account Number: [id number] Date of Birth/Sex: Treating RN: Sep 08, 1935 (83 y.o. M) Primary Care Provider: Haynes Hoehn Other Clinician: Referring Provider: Treating Provider/Extender:Karalina Tift, [name], [name]: 4 History of Present Illness HPI Description: ADMISSION 12/12/2018 This is an 83 year old man who is a very complicated patient. He has apparently been followed at the wound care center at [facility] in [place] for a number of years with ulcers that have been described as secondary to chronic venous insufficiency with secondary lymphedema. His wife states that these will come and go she has been to that center multiple times. Most of the recent wounds have apparently been on the left leg. She states that at the end of September she started to see brown spots developing on the right leg which progressed and moved into necrotic areas on multiple areas of the right lower leg. Also spots on the dorsal feet. He started to develop generalized weakness could not walk. He was admitted for 1 day in early October to [facility] but was discharged and told that he had a UTI. He was then admitted from 11/12/2018 through 11/22/2018. He was felt to have bilateral lower extremity cellulitis on the background of lymphedema and venous stasis ulceration. He was treated with broad-spectrum antibiotics. He was reviewed by Dr. Sharol Given and provided with some form of compression stocking although the patient states that the drainage from his wound stuck to these and cause damage to the skin when these were taken off. He has since been discharged to skilled facility associated with [facility]. The patient's wife is quite descriptive although unfortunately she did not actually take pictures of the  wound development. She stated that they had never seen anything like this before. Then there was the deterioration with regards to his mobility. I am not sure that that is gotten any better. Past medical history; hypertension, BPH, coronary artery disease with stents, malignant tumor of the colon, abdominal aortic aneurysm followed with annual ultrasounds but I am not really sure who is following this ABIs in our clinic were 0.74 on the right 0.61 on the left 11/16; patient's appointment with Dr. Donzetta Matters of vascular surgery is not till 10/23. I did put in a secure text message about this patient. He comes in today with some multiple wound areas on the right leg looking a lot better. Most substantially the wounds are located on the right lateral lower leg. On the left there is the left medial calcaneus. The patient clearly has chronic venous insufficiency with secondary lymphedema however I wondered whether he had macrovascular disease and/or some of the damage on the right leg could be related to a vasculopathy. In any case today things look substantially better than last week. The patient is still at [facility] skilled facility 12/3; since the patient was last here 2-1/2 weeks ago he is been admitted to the hospital for procedure by Dr. Donzetta Matters. At some point he was also found to have a DVT in the right femoral vein. He is on anticoagulation. He underwent aortogram with bilateral lower extremity angiograms on 01/09/2019. This showed the aorta and iliac segments to be tortuous but no flow-limiting stenosis. Bilateral he has SFA nonlimiting stenosis although heavily calcified. He has took two-vessel runoff bilaterally which are quite large vessels. From the tone of this note I really did not think that there was  felt to be any macrovascular stenosis. This leaves the initial appearance of his legs with multiple right greater than left lower extremity punched out wounds somewhat difficult to explain in my  mind. I do not think this had anything to do with venous disease either reflux or clots In the meantime his legs are actually doing quite better. We have been using silver alginate Curlex and Coban. He was discharged from [place] and is now at home. He is actually doing quite Pensions consultant) Signed: 01/13/2019 7:57:15 AM By: Linton Ham MD Entered By: Linton Ham on 01/12/2019 13:32:28 -------------------------------------------------------------------------------- Physical Exam Details Patient Name: Date of Service: [name]. 01/12/2019 11:15 AM Medical Record Number:[id number] Patient Account Number: [id number] Date of Birth/Sex: Treating RN: Mar 08, 1935 (83 y.o. M) Primary Care Provider: Haynes Hoehn Other Clinician: Referring Provider: Treating Provider/Extender:Quetzaly Ebner, [name], [name]: 4 Constitutional Sitting or standing Blood Pressure is within target range for patient.. Pulse regular and within target range for patient.Marland Kitchen Respirations regular, non-labored and within target range.. Temperature is normal and within the target range for the patient.Marland Kitchen Appears in no distress. Eyes Conjunctivae clear. No discharge.no icterus. Respiratory work of breathing is normal. Cardiovascular Heart rhythm and rate regular, without murmur or gallop.. Femoral pulses palpable. Salas pedis pulses are palpable. Gastrointestinal (GI) No palpable AAA no masses. Integumentary (Hair, Skin) Changes of chronic venous insufficiency. Psychiatric appears at normal baseline. Notes Wound exam; the patient's leg actually looks a lot better. There are 2 open wounds on the lower anterior right lower leg and an area on the left ankle medially. All of these look reasonably healthy. Electronic Signature(s) Signed: 01/13/2019 7:57:15 AM By: Linton Ham MD Entered By: Linton Ham on 01/12/2019  13:35:46 -------------------------------------------------------------------------------- Physician Orders Details Patient Name: Date of Service: [name] 01/12/2019 11:15 AM Medical Record Number:[id number] Patient Account Number: [id number] Date of Birth/Sex: Treating RN: 04-Dec-1935 (83 y.o. [name] Primary Care Provider: Haynes Hoehn Other Clinician: Referring Provider: Treating Provider/Extender:Jereld Presti, [name], [name]: 4 Verbal / Phone Orders: No Diagnosis Coding ICD-10 Coding Code Description I87.333 Chronic venous hypertension (idiopathic) with ulcer and inflammation of bilateral lower extremity L97.818 Non-pressure chronic ulcer of other part of right lower leg with other specified severity L97.518 Non-pressure chronic ulcer of other part of right foot with other specified severity L97.328 Non-pressure chronic ulcer of left ankle with other specified severity Follow-up Appointments Return Appointment in 2 weeks. Dressing Change Frequency Wound #2 Right Lower Leg Other: - change twice a week. wound center every two weeks. Wound #4 Left,Medial Malleolus Other: - change twice a week. wound center every two weeks. Skin Barriers/Peri-Wound Care Wound #4 Left,Medial Malleolus TCA Cream or Ointment - both legs - mixed with lotion in clinic. Patient to use the TCA/cetaphil lotion mix at home with dressing changes. Wound Cleansing Wound #2 Right Lower Leg May shower and wash wound with soap and water. - on days that dressing is changed Wound #4 Left,Medial Malleolus May shower and wash wound with soap and water. - on days that dressing is changed Primary Wound Dressing Wound #2 Right Lower Leg Calcium Alginate with Silver Wound #4 Left,Medial Malleolus Calcium Alginate with Silver Secondary Dressing Wound #2 Right Lower Leg Dry Gauze ABD pad Wound #4 Left,Medial Malleolus Dry Gauze ABD pad Edema Control Kerlix and Coban -  Bilateral Avoid standing for long periods of time Elevate legs to the level of the heart or above for 30 minutes daily and/or when  sitting, a frequency of: - throughout the day Off-Loading Turn and reposition every 2 hours [place] skilled nursing for wound care. - [facility] home [facility] change twice a week. wound center every two weeks. Patient Medications Allergies: Sulfa (Sulfonamide Antibiotics) Notifications Medication Indication Start End triamcinolone acetonide stasis dermatitis 01/12/2019 DOSE topical 0.1 % cream - cream topical 1/4 in cetakphil cream apply to lower extremites with dressing changes Electronic Signature(s) Signed: 01/12/2019 1:40:56 PM By: Linton Ham MD Entered By: Linton Ham on 01/12/2019 13:40:55 -------------------------------------------------------------------------------- Problem List Details Patient Name: Date of Service: [name] 01/12/2019 11:15 AM Medical Record Number:[id number] Patient Account Number: [id number] Date of Birth/Sex: Treating RN: 12-Mar-1935 (83 y.o. [name] Primary Care Provider: Haynes Hoehn Other Clinician: Referring Provider: Treating Provider/Extender:Tameko Halder, [name], [name]: 4 Active Problems ICD-10 Evaluated Encounter Code Description Active Date Today Diagnosis I87.333 Chronic venous hypertension (idiopathic) with ulcer 12/12/2018 No Yes and inflammation of bilateral lower extremity L97.818 Non-pressure chronic ulcer of other part of right lower 12/12/2018 No Yes leg with other specified severity L97.518 Non-pressure chronic ulcer of other part of right foot 12/12/2018 No Yes with other specified severity L97.328 Non-pressure chronic ulcer of left ankle with other 12/12/2018 No Yes specified severity Inactive Problems Resolved Problems Electronic Signature(s) Signed: 01/13/2019 7:57:15 AM By: Linton Ham MD Entered By: Linton Ham on 01/12/2019  13:29:14 -------------------------------------------------------------------------------- Progress Note Details Patient Name: Date of Service: [name] 01/12/2019 11:15 AM Medical Record Number:[id number] Patient Account Number: [id number] Date of Birth/Sex: Treating RN: 16-Sep-1935 (83 y.o. M) Primary Care Provider: Haynes Hoehn Other Clinician: Referring Provider: Treating Provider/Extender:Jozef Eisenbeis, [name], [name]: 4 Subjective History of Present Illness (HPI) ADMISSION 12/12/2018 This is an 83 year old man who is a very complicated patient. He has apparently been followed at the wound care center at [facility] in [place] for a number of years with ulcers that have been described as secondary to chronic venous insufficiency with secondary lymphedema. His wife states that these will come and go she has been to that center multiple times. Most of the recent wounds have apparently been on the left leg. She states that at the end of September she started to see brown spots developing on the right leg which progressed and moved into necrotic areas on multiple areas of the right lower leg. Also spots on the dorsal feet. He started to develop generalized weakness could not walk. He was admitted for 1 day in early October to [facility] but was discharged and told that he had a UTI. He was then admitted from 11/12/2018 through 11/22/2018. He was felt to have bilateral lower extremity cellulitis on the background of lymphedema and venous stasis ulceration. He was treated with broad-spectrum antibiotics. He was reviewed by Dr. Sharol Given and provided with some form of compression stocking although the patient states that the drainage from his wound stuck to these and cause damage to the skin when these were taken off. He has since been discharged to skilled facility associated with [facility]. The patient's wife is quite descriptive although unfortunately she  did not actually take pictures of the wound development. She stated that they had never seen anything like this before. Then there was the deterioration with regards to his mobility. I am not sure that that is gotten any better. Past medical history; hypertension, BPH, coronary artery disease with stents, malignant tumor of the colon, abdominal aortic aneurysm followed with annual ultrasounds but I am not  really sure who is following this ABIs in our clinic were 0.74 on the right 0.61 on the left 11/16; patient's appointment with Dr. Donzetta Matters of vascular surgery is not till 10/23. I did put in a secure text message about this patient. He comes in today with some multiple wound areas on the right leg looking a lot better. Most substantially the wounds are located on the right lateral lower leg. On the left there is the left medial calcaneus. The patient clearly has chronic venous insufficiency with secondary lymphedema however I wondered whether he had macrovascular disease and/or some of the damage on the right leg could be related to a vasculopathy. In any case today things look substantially better than last week. The patient is still at [facility] skilled facility 12/3; since the patient was last here 2-1/2 weeks ago he is been admitted to the hospital for procedure by Dr. Donzetta Matters. At some point he was also found to have a DVT in the right femoral vein. He is on anticoagulation. He underwent aortogram with bilateral lower extremity angiograms on 01/09/2019. This showed the aorta and iliac segments to be tortuous but no flow-limiting stenosis. Bilateral he has SFA nonlimiting stenosis although heavily calcified. He has took two-vessel runoff bilaterally which are quite large vessels. From the tone of this note I really did not think that there was felt to be any macrovascular stenosis. This leaves the initial appearance of his legs with multiple right greater than left lower extremity punched out wounds  somewhat difficult to explain in my mind. I do not think this had anything to do with venous disease either reflux or clots In the meantime his legs are actually doing quite better. We have been using silver alginate Curlex and Coban. He was discharged from [place] and is now at home. He is actually doing quite well Objective Constitutional Sitting or standing Blood Pressure is within target range for patient.. Pulse regular and within target range for patient.Marland Kitchen Respirations regular, non-labored and within target range.. Temperature is normal and within the target range for the patient.Marland Kitchen Appears in no distress. Vitals Time Taken: 12:06 PM, Height: 76 in, Weight: 227 lbs, BMI: 27.6, Temperature: 97.6 F, Pulse: 67 bpm, Respiratory Rate: 18 breaths/min, Blood Pressure: 132/87 mmHg. Eyes Conjunctivae clear. No discharge.no icterus. Respiratory work of breathing is normal. Cardiovascular Heart rhythm and rate regular, without murmur or gallop.. Femoral pulses palpable. Salas pedis pulses are palpable. Gastrointestinal (GI) No palpable AAA no masses. Psychiatric appears at normal baseline. General Notes: Wound exam; the patient's leg actually looks a lot better. There are 2 open wounds on the lower anterior right lower leg and an area on the left ankle medially. All of these look reasonably healthy. Integumentary (Hair, Skin) Changes of chronic venous insufficiency. Wound #2 status is Open. Original cause of wound was Gradually Appeared. The wound is located on the Right Lower Leg. The wound measures 11cm length x 8cm width x 0.1cm depth; 69.115cm^2 area and 6.912cm^3 volume. There is Fat Layer (Subcutaneous Tissue) Exposed exposed. There is no tunneling or undermining noted. There is a small amount of serosanguineous drainage noted. The wound margin is distinct with the outline attached to the wound base. There is medium (34-66%) red, hyper - granulation within the wound bed.  There is a medium (34-66%) amount of necrotic tissue within the wound bed including Eschar and Adherent Slough. Wound #4 status is Open. Original cause of wound was Gradually Appeared. The wound is located on the  Left,Medial Malleolus. The wound measures 1.2cm length x 4cm width x 0.1cm depth; 3.77cm^2 area and 0.377cm^3 volume. There is Fat Layer (Subcutaneous Tissue) Exposed exposed. There is no tunneling or undermining noted. There is a medium amount of serosanguineous drainage noted. The wound margin is distinct with the outline attached to the wound base. There is medium (34-66%) red, pink, hyper - granulation within the wound bed. There is a medium (34-66%) amount of necrotic tissue within the wound bed including Adherent Slough. Assessment Active Problems ICD-10 Chronic venous hypertension (idiopathic) with ulcer and inflammation of bilateral lower extremity Non-pressure chronic ulcer of other part of right lower leg with other specified severity Non-pressure chronic ulcer of other part of right foot with other specified severity Non-pressure chronic ulcer of left ankle with other specified severity Plan Follow-up Appointments: Return Appointment in 2 weeks. Dressing Change Frequency: Wound #2 Right Lower Leg: Other: - change twice a week. wound center every two weeks. Wound #4 Left,Medial Malleolus: Other: - change twice a week. wound center every two weeks. Skin Barriers/Peri-Wound Care: Wound #4 Left,Medial Malleolus: TCA Cream or Ointment - both legs - mixed with lotion in clinic. Patient to use the TCA/cetaphil lotion mix at home with dressing changes. Wound Cleansing: Wound #2 Right Lower Leg: May shower and wash wound with soap and water. - on days that dressing is changed Wound #4 Left,Medial Malleolus: May shower and wash wound with soap and water. - on days that dressing is changed Primary Wound Dressing: Wound #2 Right Lower Leg: Calcium Alginate with Silver Wound  #4 Left,Medial Malleolus: Calcium Alginate with Silver Secondary Dressing: Wound #2 Right Lower Leg: Dry Gauze ABD pad Wound #4 Left,Medial Malleolus: Dry Gauze ABD pad Edema Control: Kerlix and Coban - Bilateral Avoid standing for long periods of time Elevate legs to the level of the heart or above for 30 minutes daily and/or when sitting, a frequency of: - throughout the day Off-Loading: Turn and reposition every 2 hours Home Health: [facility] skilled nursing for wound care. - [facility] home health change twice a week. wound center every two weeks. The following medication(s) was prescribed: triamcinolone acetonide topical 0.1 % cream cream topical 1/4 in cetakphil cream apply to lower extremites with dressing changes for stasis dermatitis starting 01/12/2019 1. We are going to continue with silver alginate to the wounds under kerlix Coban 2. I am going to send in some triamcinolone 1/4 and Cetaphil to the skin under the wraps to cut down on the degree of inflammation and help beat with making the skin more supple 3. In my view the cause of this man's original lower extremity presentation is not really clear. He has a lot of calcifications in his major vessels and I would think that some of this was microcalcifications from this seems most likely. Vasculopathy at the small vessel level would also seem possible however I would not pursue this unless he has another presentation similar to the one he came in with. 4. He tells me that he has compression pumps at home prescribed by Dr. Nils Pyle when Dr. Nils Pyle was at the wound care center in [place]. I think it is safe to go ahead and use these. He does not seem to have a major macrovascular stenosis Electronic Signature(s) Signed: 01/12/2019 1:41:19 PM By: Linton Ham MD Entered By: Linton Ham on 01/12/2019 13:41:19 -------------------------------------------------------------------------------- SuperBill  Details Patient Name: Date of Service: [name] 01/12/2019 Medical Record Number:[id number] Patient Account Number: [id number] Date of Birth/Sex: Treating  RN: [birth year] (83 y.o. [name] Primary Care Provider: Haynes Hoehn Other Clinician: Referring Provider: Treating Provider/Extender:Najib Colmenares, [name], [name]: 4 Diagnosis Coding ICD-10 Codes Code Description I87.333 Chronic venous hypertension (idiopathic) with ulcer and inflammation of bilateral lower extremity L97.818 Non-pressure chronic ulcer of other part of right lower leg with other specified severity L97.518 Non-pressure chronic ulcer of other part of right foot with other specified severity L97.328 Non-pressure chronic ulcer of left ankle with other specified severity Facility Procedures The patient participates with Medicare or their insurance follows the Medicare Facility Guidelines: CPT4 Code Description Modifier Quantity [id number] [place] VISIT-LEV 5 EST PT 1 Physician Procedures CPT4: Code [id number] Description: 214 - WC PHYS LEVEL 4 - EST PT ICD-10 Diagnosis Description L97.818 Non-pressure chronic ulcer of other part of right lower le severity I87.333 Chronic venous hypertension (idiopathic) with ulcer and in lower extremity L97.328 Non-pressure  chronic ulcer of left ankle with other specif Modifier: g with other spec flammation of bil ied severity Quantity: 1 ified ateral Electronic Signature(s) Signed: 01/13/2019 7:57:15 AM By: Linton Ham MD Entered By: Linton Ham on 01/12/2019 13:39:04

## 2019-01-18 NOTE — Progress Notes (Signed)
SAVALAS, WIDMAN (PK:[id number]) Visit Report for 12/12/2018 Abuse/Suicide Risk Screen Details Patient Name: Date of Service: [name], [name] 12/12/2018 10:30 AM Medical Record Number:[id number] Patient Account Number: [id number] Date of Birth/Sex: Treating RN: 05-May-1935 (83 y.o. [name]) Carlene Coria Primary Care [name]: Haynes Hoehn Other Clinician: Referring Konica Stankowski: Treating Ruqaya Strauss/Extender:[name], [name], [name]: 0 Abuse/Suicide Risk Screen Items Answer ABUSE RISK SCREEN: Has anyone close to you tried to hurt or harm you recentlyo No Do you feel uncomfortable with anyone in your familyo No Has anyone forced you do things that you didnt want to doo No Electronic Signature(s) Signed: 01/18/2019 12:05:18 PM By: Carlene Coria RN Entered By: Carlene Coria on 12/12/2018 11:27:01 -------------------------------------------------------------------------------- Activities of Daily Living Details Patient Name: Date of Service: [name], [name] 12/12/2018 10:30 AM Medical Record Number:[id number] Patient Account Number: [id number] Date of Birth/Sex: Treating RN: 1935/12/24 (83 y.o. [name]) Carlene Coria Primary Care Bj Morlock: Haynes Hoehn Other Clinician: Referring Brallan Denio: Treating Dejanique Ruehl/Extender:[name], [name], [name]: 0 Activities of Daily Living Items Answer Activities of Daily Living (Please select one for each item) Drive Automobile Not Able Take Medications Completely Able Use Telephone Completely Able Care for Appearance Need Assistance Use Toilet Need Assistance Bath / Shower Need Assistance Dress Self Need Assistance Feed Self Completely Able Walk Not Able Get In / Out Bed Need Assistance Housework Not Able Prepare Meals Not Able Handle Money Need Assistance Shop for Self Need Assistance Electronic Signature(s) Signed: 01/18/2019 12:05:18 PM By: Carlene Coria RN Entered By: Carlene Coria on 12/12/2018  11:27:51 -------------------------------------------------------------------------------- Education Screening Details Patient Name: Date of Service: [name] 12/12/2018 10:30 AM Medical Record Number:[id number] Patient Account Number: [id number] Date of Birth/Sex: Treating RN: 06-May-1935 (83 y.o. [name]) Carlene Coria Primary Care [name]: Haynes Hoehn Other Clinician: Referring [name]: Treating Laykin Rainone/Extender:[name], [name], [name]: 0 Primary Learner Assessed: Patient Learning Preferences/Education Level/Primary Language Learning Preference: Explanation Highest Education Level: High School Preferred Language: English Cognitive Barrier Language Barrier: No Translator Needed: No Memory Deficit: No Emotional Barrier: No Cultural/Religious Beliefs Affecting Medical Care: No Physical Barrier Impaired Vision: Yes Glasses Impaired Hearing: No Decreased Hand dexterity: No Knowledge/Comprehension Knowledge Level: High Comprehension Level: High Ability to understand written High instructions: Ability to understand verbal High instructions: Motivation Anxiety Level: Calm Cooperation: Cooperative Education Importance: Acknowledges Need Interest in Health Problems: Asks Questions Perception: Coherent Willingness to Engage in Self- High Management Activities: Readiness to Engage in Self- High Management Activities: Electronic Signature(s) Signed: 01/18/2019 12:05:18 PM By: Carlene Coria RN Entered By: Carlene Coria on 12/12/2018 11:28:26 -------------------------------------------------------------------------------- Fall Risk Assessment Details Patient Name: Date of Service: [name], [name] 12/12/2018 10:30 AM Medical Record Number:[id number] Patient Account Number: [id number] Date of Birth/Sex: Treating RN: [birth year] (83 y.o. [name]) Carlene Coria Primary Care [name]: Haynes Hoehn Other Clinician: Referring [name]: Treating  [name]/Extender:[name], [name], [name]: 0 Fall Risk Assessment Items Have you had 2 or more falls in the last 12 monthso 0 No Have you had any fall that resulted in injury in the last 12 monthso 0 No FALLS RISK SCREEN History of falling - immediate or within 3 months 25 Yes Secondary diagnosis (Do you have 2 or more medical diagnoseso) 0 No Ambulatory aid None/bed rest/wheelchair/nurse 0 No Crutches/cane/walker 0 No Furniture 0 No Intravenous therapy Access/Saline/Heparin Lock 0 No Weak (short steps with or without shuffle, stooped but able to lift head 0 No while walking, may seek support from furniture) Impaired (short steps with shuffle, may have  difficulty arising from chair, 0 No head down, impaired balance) Mental Status Oriented to own ability 0 No Overestimates or forgets limitations 0 No Risk Level: Medium Risk Score: 25 Electronic Signature(s) Signed: 01/18/2019 12:05:18 PM By: Carlene Coria RN Entered By: Carlene Coria on 12/12/2018 11:28:52 -------------------------------------------------------------------------------- Foot Assessment Details Patient Name: Date of Service: [name] 12/12/2018 10:30 AM Medical Record Number:[id number] Patient Account Number: [id number] Date of Birth/Sex: Treating RN: 1935/10/11 (83 y.o. [name]) Carlene Coria Primary Care [name]: Haynes Hoehn Other Clinician: Referring Thetis Schwimmer: Treating [name]/Extender:[name], [name], [name]: 0 Foot Assessment Items Site Locations + = Sensation present, - = Sensation absent, C = Callus, U = Ulcer R = Redness, W = Warmth, M = Maceration, PU = Pre-ulcerative lesion F = Fissure, S = Swelling, D = Dryness Assessment Right: Left: Other Deformity: No No Prior Foot Ulcer: No No Prior Amputation: No No Charcot Joint: No No Ambulatory Status: Ambulatory Without Help Gait: Unsteady Electronic Signature(s) Signed: 01/18/2019 12:05:18 PM By: Carlene Coria RN Entered By: Carlene Coria on 12/12/2018 11:32:34 -------------------------------------------------------------------------------- Nutrition Risk Screening Details Patient Name: Date of Service: [name], [name] 12/12/2018 10:30 AM Medical Record Number:[id number] Patient Account Number: [id number] Date of Birth/Sex: Treating RN: 05/17/1935 (83 y.o. [name]) Carlene Coria Primary Care Raisha Brabender: Haynes Hoehn Other Clinician: Referring Yaneth Fairbairn: Treating Ozzie Knobel/Extender:[name], [name], [name]: 0 Height (in): 76 Weight (lbs): 227 Body Mass Index (BMI): 27.6 Nutrition Risk Screening Items Score Screening NUTRITION RISK SCREEN: I have an illness or condition that made me change the kind and/or 0 No amount of food I eat I eat fewer than two meals per day 0 No I eat few fruits and vegetables, or milk products 0 No I have three or more drinks of beer, liquor or wine almost every day 0 No I have tooth or mouth problems that make it hard for me to eat 0 No I don't always have enough money to buy the food I need 0 No I eat alone most of the time 0 No I take three or more different prescribed or over-the-counter drugs a day 1 Yes 2 Yes Without wanting to, I have lost or gained 10 pounds in the last six months I am not always physically able to shop, cook and/or feed myself 2 Yes Nutrition Protocols Good Risk Protocol Provide education on Moderate Risk Protocol 0 nutrition High Risk Proctocol Risk Level: Moderate Risk Score: 5 Electronic Signature(s) Signed: 01/18/2019 12:05:18 PM By: Carlene Coria RN Entered By: Carlene Coria on 12/12/2018 11:29:15

## 2019-01-18 NOTE — Progress Notes (Signed)
PAU, ZELDIN (ZN:[id number]) Visit Report for 12/12/2018 Allergy List Details Patient Name: Date of Service: [name], [name] 12/12/2018 10:30 AM Medical Record Number:[id number] Patient Account Number: [id number] Date of Birth/Sex: Treating RN: 1935/10/01 (83 y.o. [name]) [name] Primary Care [name]: [name] Other Clinician: Referring [name]: Treating [name]/Extender:[name] Weeks in Treatment: 0 Allergies Active Allergies Sulfa (Sulfonamide Antibiotics) Allergy Notes Electronic Signature(s) Signed: 01/18/2019 12:05:18 PM By: [name] Coria RN Entered By: [name] on 12/12/2018 11:13:05 -------------------------------------------------------------------------------- Arrival Information Details Patient Name: Date of Service: [name] 12/12/2018 10:30 AM Medical Record Number:[id number] Patient Account Number: [id number] Date of Birth/Sex: Treating RN: [birth year] (83 y.o. [name]) [name] Primary Care [name]: [name] Other Clinician: Referring [name]: Treating [name]/Extender:[name] Weeks in Treatment: 0 Visit Information Patient Arrived: Wheel Chair Arrival Time: 11:09 Accompanied By: wife Transfer Assistance: None Patient Identification Verified: Yes Secondary Verification Process Completed: Yes Patient Requires Transmission-Based No Precautions: Patient Has Alerts: No Electronic Signature(s) Signed: 01/18/2019 12:05:18 PM By: [name] Coria RN Entered By: [name] on 12/12/2018 11:11:23 -------------------------------------------------------------------------------- Clinic Level of Care Assessment Details Patient Name: Date of Service: [name], [name] 12/12/2018 10:30 AM Medical Record Number:[id number] Patient Account Number: [id number] Date of Birth/Sex: Treating RN: Jun 09, 1935 (83 y.o. [name] Primary Care Kehaulani Fruin: [name] Other Clinician: Referring [name]: Treating  [name]/Extender:[name] Weeks in Treatment: 0 Clinic Level of Care Assessment Items TOOL 2 Quantity Score X - Use when only an EandM is performed on the INITIAL visit 1 0 ASSESSMENTS - Nursing Assessment / Reassessment X - General Physical Exam (combine w/ comprehensive assessment (listed just below) 1 20 when performed on new pt. evals) X - Comprehensive Assessment (HX, ROS, Risk Assessments, Wounds Hx, etc.) 1 25 ASSESSMENTS - Wound and Skin Assessment / Reassessment []  - Simple Wound Assessment / Reassessment - one wound 0 X - Complex Wound Assessment / Reassessment - multiple wounds 4 5 []  - Dermatologic / Skin Assessment (not related to wound area) 0 ASSESSMENTS - Ostomy and/or Continence Assessment and Care []  - Incontinence Assessment and Management 0 []  - Ostomy Care Assessment and Management (repouching, etc.) 0 PROCESS - Coordination of Care []  - Simple Patient / Family Education for ongoing care 0 X - Complex (extensive) Patient / Family Education for ongoing care 1 20 X - Staff obtains Programmer, systems, Records, Test Results / Process Orders 1 10 []  - Staff telephones HHA, Nursing Homes / Clarify orders / etc 0 []  - Routine Transfer to another Facility (non-emergent condition) 0 []  - Routine Hospital Admission (non-emergent condition) 0 X - New Admissions / Biomedical engineer / Ordering NPWT, Apligraf, etc. 1 15 []  - Emergency Hospital Admission (emergent condition) 0 []  - Simple Discharge Coordination 0 X - Complex (extensive) Discharge Coordination 1 15 PROCESS - Special Needs []  - Pediatric / Minor Patient Management 0 []  - Isolation Patient Management 0 []  - Hearing / Language / Visual special needs 0 X - Assessment of Community assistance (transportation, D/C planning, etc.) 1 15 []  - Additional assistance / Altered mentation 0 []  - Support Surface(s) Assessment (bed, cushion, seat, etc.) 0 INTERVENTIONS - Wound Cleansing / Measurement X -  Wound Imaging (photographs - any number of wounds) 1 5 []  - Wound Tracing (instead of photographs) 0 []  - Simple Wound Measurement - one wound 0 X - Complex Wound Measurement - multiple wounds 4 5 []  - Simple Wound Cleansing - one wound 0 X - Complex Wound Cleansing - multiple  wounds 4 5 INTERVENTIONS - Wound Dressings []  - Small Wound Dressing one or multiple wounds 0 X - Medium Wound Dressing one or multiple wounds 4 15 []  - Large Wound Dressing one or multiple wounds 0 []  - Application of Medications - injection 0 INTERVENTIONS - Miscellaneous []  - External ear exam 0 []  - Specimen Collection (cultures, biopsies, blood, body fluids, etc.) 0 []  - Specimen(s) / Culture(s) sent or taken to Lab for analysis 0 []  - Patient Transfer (multiple staff / Harrel Lemon Lift / Similar devices) 0 []  - Simple Staple / Suture removal (25 or less) 0 []  - Complex Staple / Suture removal (26 or more) 0 []  - Hypo / Hyperglycemic Management (close monitor of Blood Glucose) 0 X - Ankle / Brachial Index (ABI) - do not check if billed separately 1 15 Has the patient been seen at the hospital within the last three years: Yes Total Score: 260 Level Of Care: New/Established - Level 5 Electronic Signature(s) Signed: 12/15/2018 5:16:49 PM By: Kela Millin Entered By: Kela Millin on 12/12/2018 12:55:28 -------------------------------------------------------------------------------- Encounter Discharge Information Details Patient Name: Date of Service: [name]. 12/12/2018 10:30 AM Medical Record Number:[id number] Patient Account Number: [id number] Date of Birth/Sex: Treating RN: 15-Aug-1935 (83 y.o. [name] Primary Care Brayson Livesey: [name] Other Clinician: Referring Reco Shonk: Treating [name]/Extender:[name] Weeks in Treatment: 0 Encounter Discharge Information Items Discharge Condition: Stable Ambulatory Status: Wheelchair Discharge Destination:  Home Transportation: Private Auto Accompanied By: wife Schedule Follow-up Appointment: Yes Clinical Summary of Care: Electronic Signature(s) Signed: 12/12/2018 5:49:34 PM By: Deon Pilling Entered By: Deon Pilling on 12/12/2018 13:56:12 -------------------------------------------------------------------------------- Lower Extremity Assessment Details Patient Name: Date of Service: [name], [name] 12/12/2018 10:30 AM Medical Record Number:[id number] Patient Account Number: [id number] Date of Birth/Sex: Treating RN: December 29, 1935 (83 y.o. [name]) [name] Primary Care Mena Lienau: [name] Other Clinician: Referring Yanet Balliet: Treating Markian Glockner/Extender:[name] Weeks in Treatment: 0 Edema Assessment Assessed: [Left: Yes] [Right: Yes] Edema: [Left: Yes] [Right: Yes] Calf Left: Right: Point of Measurement: 45 cm From Medial Instep 37 cm 37 cm Ankle Left: Right: Point of Measurement: 11 cm From Medial Instep 27 cm 24 cm Vascular Assessment Blood Pressure: Brachial: [Left:135] [Right:135] Ankle: [Left:Dorsalis Pedis: 90 0.67] [Right:Dorsalis Pedis: 100 0.74] Electronic Signature(s) Signed: 01/18/2019 12:05:18 PM By: [name] Coria RN Entered By: [name] on 12/12/2018 12:05:33 -------------------------------------------------------------------------------- Multi Wound Chart Details Patient Name: Date of Service: [name]. 12/12/2018 10:30 AM Medical Record Number:[id number] Patient Account Number: [id number] Date of Birth/Sex: Treating RN: 12/10/1935 (83 y.o. [name] Primary Care Declan Mier: [name] Other Clinician: Referring Doria Fern: Treating Refugio Mcconico/Extender:[name] Weeks in Treatment: 0 Vital Signs Height(in): 28 Pulse(bpm): 21 Weight(lbs): 34 Blood Pressure(mmHg): 135/58 Body Mass Index(BMI): 28 Temperature(F): 97.9 Respiratory 20 Rate(breaths/min): Photos: [1:No Photos] [2:No Photos] [3:No Photos] Wound  Location: [1:Right Knee] [2:Right Lower Leg] [3:Left Knee] Wounding Event: [1:Trauma] [2:Gradually Appeared] [3:Trauma] Primary Etiology: [1:Trauma, Other] [2:Venous Leg Ulcer] [3:Trauma, Other] Comorbid History: [1:Chronic Obstructive Pulmonary Disease (COPD), Coronary Artery (COPD), Coronary Artery (COPD), Coronary Artery Disease, Hypertension, Peripheral Venous Disease, Peripheral Venous Disease, Peripheral Venous Disease, Received  Chemotherapy, Received Chemotherapy, Received Chemotherapy, Received Radiation] [2:Chronic Obstructive Pulmonary Disease Disease, Hypertension, Received Radiation] [3:Chronic Obstructive Pulmonary Disease Disease, Hypertension, Received Radiation] Date Acquired: [1:11/10/2018] [2:03/12/2018] [3:11/10/2018] Weeks of Treatment: [1:0] [2:0] [3:0] Wound Status: [1:Open] [2:Open] [3:Open] Clustered Wound: [1:No] [2:Yes] [3:No] Measurements L x W x D 2x2.5x0.1 [2:22.5x19x0.2] [3:1.3x1.4x0.1] (cm) Area (cm) : [1:3.927] [2:335.758] [3:1.429] Volume (cm) : [  44:0.393] [2:67.152] [3:0.143] Classification: [1:Full Thickness Without Exposed Support Structures Exposed Support Structures Exposed Support Structures] [2:Full Thickness Without] [3:Full Thickness Without] Exudate Amount: [1:Small] [2:Small] [3:Small] Exudate Type: [1:Serosanguineous] [2:Serosanguineous] [3:Serosanguineous] Exudate Color: [1:red, brown] [2:red, brown] [3:red, brown] Granulation Amount: [1:Large (67-100%)] [2:Medium (34-66%)] [3:Large (67-100%)] Granulation Quality: [1:Red] [2:Red] [3:Red] Necrotic Amount: [1:None Present (0%)] [2:Medium (34-66%)] [3:N/A] Necrotic Tissue: [1:N/A] [2:Eschar, Adherent Slough N/A] Exposed Structures: [1:Fat Layer (Subcutaneous Fat Layer (Subcutaneous Fat Layer (Subcutaneous Tissue) Exposed: Yes Fascia: No Tendon: No Muscle: No Joint: No Bone: No] [2:Tissue) Exposed: Yes Fascia: No Tendon: No Muscle: No Joint: No Bone: No] [3:Tissue) Exposed: Yes  Fascia: No Tendon: No  Muscle: No Joint: No Bone: No] Epithelialization: [1:Medium (34-66%)] [2:None 4] [3:Small (1-33%) N/A N/A] Photos: [1:No Photos] [2:N/A] [3:N/A] Wound Location: [1:Left Malleolus - Medial] [2:N/A] [3:N/A] Wounding Event: [1:Gradually Appeared] [2:N/A] [3:N/A] Primary Etiology: [1:Venous Leg Ulcer] [2:N/A] [3:N/A] Comorbid History: [1:Chronic Obstructive Pulmonary Disease (COPD), Coronary Artery Disease, Hypertension, Peripheral Venous Disease, Received Chemotherapy, Received Radiation] [2:N/A] [3:N/A] Date Acquired: [1:03/12/2018] [2:N/A] [3:N/A] Weeks of Treatment: [1:0] [2:N/A] [3:N/A] Wound Status: [1:Open] [2:N/A] [3:N/A] Clustered Wound: [1:No] [2:N/A] [3:N/A] Measurements L x W x D 2x4.7x0.2 [2:N/A] [3:N/A] (cm) Area (cm) : [1:7.383] [2:N/A] [3:N/A] Volume (cm) : [1:1.477] [2:N/A] [3:N/A] Classification: [1:Full Thickness Without Exposed Support Structures] [2:N/A] [3:N/A] Exudate Amount: [1:N/A] [2:N/A] [3:N/A] Exudate Type: [1:N/A] [2:N/A] [3:N/A] Exudate Color: [1:N/A] [2:N/A] [3:N/A] Granulation Amount: [1:Medium (34-66%)] [2:N/A] [3:N/A] Granulation Quality: [1:Red, Pink] [2:N/A] [3:N/A] Necrotic Amount: [1:Medium (34-66%)] [2:N/A] [3:N/A] Necrotic Tissue: [1:Eschar, Adherent Slough N/A] [3:N/A] Exposed Structures: [1:Fat Layer (Subcutaneous N/A Tissue) Exposed: Yes Fascia: No Tendon: No Muscle: No Joint: No Bone: No None] [2:N/A] [3:N/A N/A] Treatment Notes Electronic Signature(s) Signed: 12/12/2018 5:57:33 PM By: Linton Ham MD Signed: 12/12/2018 6:00:40 PM By: Levan Hurst RN, BSN Entered By: Linton Ham on 12/12/2018 13:35:10 -------------------------------------------------------------------------------- Multi-Disciplinary Care Plan Details Patient Name: Date of Service: [name]. 12/12/2018 10:30 AM Medical Record Number:[id number] Patient Account Number: [id number] Date of Birth/Sex: Treating RN: Sep 23, 1935 (83 y.o. [name] Primary Care  Porscha Axley: [name] Other Clinician: Referring Prather Failla: Treating Hetty Linhart/Extender:[name] Weeks in Treatment: 0 Active Inactive Venous Leg Ulcer Nursing Diagnoses: Knowledge deficit related to disease process and management Potential for venous Insuffiency (use before diagnosis confirmed) Goals: Patient/caregiver will verbalize understanding of disease process and disease management Date Initiated: 12/12/2018 Target Resolution Date: 01/13/2019 Goal Status: Active Verify adequate tissue perfusion prior to therapeutic compression application Date Initiated: 12/12/2018 Target Resolution Date: 01/13/2019 Goal Status: Active Interventions: Compression as ordered Provide education on venous insufficiency Notes: Wound/Skin Impairment Nursing Diagnoses: Impaired tissue integrity Knowledge deficit related to smoking impact on wound healing Knowledge deficit related to ulceration/compromised skin integrity Goals: Ulcer/skin breakdown will have a volume reduction of 30% by week 4 Date Initiated: 12/12/2018 Target Resolution Date: 01/13/2019 Goal Status: Active Interventions: Provide education on ulcer and skin care Notes: Electronic Signature(s) Signed: 12/15/2018 5:16:49 PM By: Kela Millin Entered By: Kela Millin on 12/12/2018 12:44:07 -------------------------------------------------------------------------------- Pain Assessment Details Patient Name: Date of Service: [name] 12/12/2018 10:30 AM Medical Record Number:[id number] Patient Account Number: [id number] Date of Birth/Sex: Treating RN: 1935-09-12 (83 y.o. [name]) [name] Primary Care Oveda Dadamo: [name] Other Clinician: Referring Gianmarco Roye: Treating Jaida Basurto/Extender:[name] Weeks in Treatment: 0 Active Problems Location of Pain Severity and Description of Pain Patient Has Paino Yes Site Locations With Dressing Change: Yes Duration of the Pain. Constant  / Intermittento Intermittent How Long Does it [employer]  Hours: Minutes: 15 Rate the pain. Current Pain Level: 4 Worst Pain Level: 10 Least Pain Level: 0 Character of Pain Describe the Pain: Aching, Tender Pain Management and Medication Current Pain Management: Medication: Yes Cold Application: No Rest: Yes Massage: No Activity: No T.E.N.S.: No Heat Application: No Leg drop or elevation: No Is the Current Pain Management Adequate: Inadequate How does your wound impact your activities of daily livingo Sleep: No Bathing: No Appetite: No Relationship With Others: No Bladder Continence: No Emotions: No Bowel Continence: No Work: No Toileting: No Drive: No Dressing: No Hobbies: No Electronic Signature(s) Signed: 01/18/2019 12:05:18 PM By: [name] Coria RN Entered By: [name] on 12/12/2018 12:12:01 -------------------------------------------------------------------------------- Patient/Caregiver Education Details Patient Name: Date of Service: [name] 11/2/2020andnbsp10:30 AM Medical Record Number:[id number] Patient Account Number: [id number] Date of Birth/Gender: Treating RN: Aug 28, 1935 (83 y.o. [name] Primary Care Physician: [name] Other Clinician: Referring Physician: Treating Physician/Extender:[name] Weeks in Treatment: 0 Education Assessment Education Provided To: Patient Education Topics Provided Venous: Methods: Explain/Verbal Responses: State content correctly Wound/Skin Impairment: Methods: Explain/Verbal Responses: State content correctly Electronic Signature(s) Signed: 12/15/2018 5:16:49 PM By: Kela Millin Entered By: Kela Millin on 12/12/2018 12:44:21 -------------------------------------------------------------------------------- Wound Assessment Details Patient Name: Date of Service: [name] 12/12/2018 10:30 AM Medical Record Number:[id number] Patient Account Number:  [id number] Date of Birth/Sex: Treating RN: 01-Jun-1935 (83 y.o. [name]) [name] Primary Care Finis Hendricksen: [name] Other Clinician: Referring Chela Sutphen: Treating Tationa Stech/Extender:[name] Weeks in Treatment: 0 Wound Status Wound Number: 1 Primary Trauma, Other Etiology: Wound Location: Right Knee Wound Open Wounding Event: Trauma Status: Date Acquired: 11/10/2018 Comorbid Chronic Obstructive Pulmonary Disease Weeks Of Treatment: 0 History: (COPD), Coronary Artery Disease, Clustered Wound: No Hypertension, Peripheral Venous Disease, Received Chemotherapy, Received Radiation Photos Wound Measurements Length: (cm) 2 % Reduc Width: (cm) 2.5 % Reduc Depth: (cm) 0.1 Epithel Area: (cm) 3.927 Tunnel Volume: (cm) 0.393 Underm Wound Description Classification: Full Thickness Without Exposed Support Structures Exudate Small Amount: Exudate Serosanguineous Type: Exudate red, brown Color: Wound Bed Granulation Amount: Large (67-100%) Granulation Quality: Red Necrotic Amount: None Present (0%) Foul Odor After Cleansing: No Slough/Fibrino No Exposed Structure Fascia Exposed: No Fat Layer (Subcutaneous Tissue) Exposed: Yes Tendon Exposed: No Muscle Exposed: No Joint Exposed: No Bone Exposed: No tion in Area: 0% tion in Volume: 0% ialization: Medium (34-66%) ing: No ining: No Electronic Signature(s) Signed: 12/14/2018 3:21:07 PM By: Mikeal Hawthorne EMT/HBOT Signed: 01/18/2019 12:05:18 PM By: [name] Coria RN Entered By: Mikeal Hawthorne on 12/14/2018 09:08:35 -------------------------------------------------------------------------------- Wound Assessment Details Patient Name: Date of Service: [name], [name] 12/12/2018 10:30 AM Medical Record Number:[id number] Patient Account Number: [id number] Date of Birth/Sex: Treating RN: 01-Aug-1935 (83 y.o. [name]) [name] Primary Care Nicolina [name]: [name] Other Clinician: Referring Bennye Nix: Treating  Goldye Tourangeau/Extender:[name] Weeks in Treatment: 0 Wound Status Wound Number: 2 Primary Venous Leg Ulcer Etiology: Wound Location: Right Lower Leg Wound Open Wounding Event: Gradually Appeared Status: Date Acquired: 03/12/2018 Comorbid Chronic Obstructive Pulmonary Disease Weeks Of Treatment: 0 History: (COPD), Coronary Artery Disease, Clustered Wound: Yes Hypertension, Peripheral Venous Disease, Received Chemotherapy, Received Radiation Photos Wound Measurements Length: (cm) 22.5 % Reduc Width: (cm) 19 % Reduc Depth: (cm) 0.2 Epithel Area: (cm) 335.758 Tunnel Volume: (cm) 67.152 Underm Wound Description Classification: Full Thickness Without Exposed Support Foul O Structures Slough Exudate Small Amount: Exudate Serosanguineous Type: Exudate red, brown Color: Wound Bed Granulation Amount: Medium (34-66%) Granulation Quality: Red Fascia Necrotic Amount: Medium (34-66%) Fat Lay Necrotic Quality:  Eschar, Adherent Slough Tendon Muscle Joint E Bone Ex dor After Cleansing: No /Fibrino Yes Exposed Structure Exposed: No er (Subcutaneous Tissue) Exposed: Yes Exposed: No Exposed: No xposed: No posed: No tion in Area: 0% tion in Volume: 0% ialization: None ing: No ining: No Electronic Signature(s) Signed: 12/14/2018 3:21:07 PM By: Mikeal Hawthorne EMT/HBOT Signed: 01/18/2019 12:05:18 PM By: [name] Coria RN Entered By: Mikeal Hawthorne on 12/14/2018 09:26:59 -------------------------------------------------------------------------------- Wound Assessment Details Patient Name: Date of Service: [name], [name] 12/12/2018 10:30 AM Medical Record Number:[id number] Patient Account Number: [id number] Date of Birth/Sex: Treating RN: October 29, 1935 (83 y.o. [name]) [name] Primary Care Josealfredo Adkins: [name] Other Clinician: Referring Obinna Ehresman: Treating Kaydon Creedon/Extender:[name] Weeks in Treatment: 0 Wound Status Wound Number: 3 Primary  Trauma, Other Etiology: Wound Location: Left Knee Wound Open Wounding Event: Trauma Status: Date Acquired: 11/10/2018 Comorbid Chronic Obstructive Pulmonary Disease Weeks Of Treatment: 0 History: (COPD), Coronary Artery Disease, Clustered Wound: No Hypertension, Peripheral Venous Disease, Received Chemotherapy, Received Radiation Photos Wound Measurements Length: (cm) 1.3 % Reduc Width: (cm) 1.4 % Reduc Depth: (cm) 0.1 Epithel Area: (cm) 1.429 Tunnel Volume: (cm) 0.143 Underm Wound Description Classification: Full Thickness Without Exposed Support Foul O Structures Slough Exudate Small Amount: Exudate Serosanguineous Type: Exudate red, brown Color: Wound Bed Granulation Amount: Large (67-100%) Granulation Quality: Red Fascia Fat Lay Tendon Muscle Joint E Bone Ex dor After Cleansing: No /Fibrino No Exposed Structure Exposed: No er (Subcutaneous Tissue) Exposed: Yes Exposed: No Exposed: No xposed: No posed: No tion in Area: 0% tion in Volume: 0% ialization: Small (1-33%) ing: No ining: No Electronic Signature(s) Signed: 12/14/2018 3:21:07 PM By: Mikeal Hawthorne EMT/HBOT Signed: 01/18/2019 12:05:18 PM By: [name] Coria RN Entered By: Mikeal Hawthorne on 12/14/2018 09:27:22 -------------------------------------------------------------------------------- Wound Assessment Details Patient Name: Date of Service: [name], [name] 12/12/2018 10:30 AM Medical Record Number:[id number] Patient Account Number: [id number] Date of Birth/Sex: Treating RN: May 31, 1935 (83 y.o. [name]) [name] Primary Care Jabin Tapp: [name] Other Clinician: Referring Sevyn Markham: Treating Crue Otero/Extender:[name] Weeks in Treatment: 0 Wound Status Wound Number: 4 Primary Venous Leg Ulcer Etiology: Wound Location: Left Malleolus - Medial Wound Open Wounding Event: Gradually Appeared Status: Date Acquired: 03/12/2018 Comorbid Chronic Obstructive Pulmonary  Disease Weeks Of Treatment: 0 History: (COPD), Coronary Artery Disease, Clustered Wound: No Hypertension, Peripheral Venous Disease, Received Chemotherapy, Received Radiation Photos Wound Measurements Length: (cm) 2 % Reduc Width: (cm) 4.7 % Reduc Depth: (cm) 0.2 Epithel Area: (cm) 7.383 Volume: (cm) 1.477 Wound Description Classification: Full Thickness Without Exposed Support Foul Od Structures Slough/ or After Cleansing: No Fibrino Yes tion in Area: 0% tion in Volume: 0% ialization: None Wound Bed Granulation Amount: Medium (34-66%) Exposed Structure Granulation Quality: Red, Pink Fascia Exposed: No Necrotic Amount: Medium (34-66%) Fat Layer (Subcutaneous Tissue) Exposed: Yes Necrotic Quality: Eschar, Adherent Slough Tendon Exposed: No Muscle Exposed: No Joint Exposed: No Bone Exposed: No Electronic Signature(s) Signed: 12/14/2018 3:21:07 PM By: Mikeal Hawthorne EMT/HBOT Signed: 01/18/2019 12:05:18 PM By: [name] Coria RN Entered By: Mikeal Hawthorne on 12/14/2018 09:27:44 -------------------------------------------------------------------------------- Vitals Details Patient Name: Date of Service: [name], [name] 12/12/2018 10:30 AM Medical Record Number:[id number] Patient Account Number: [id number] Date of Birth/Sex: Treating RN: 1935-04-10 (83 y.o. [name]) Dolores [name], [facility] Primary Care Breccan Galant: [name] Other Clinician: Referring Kariann Wecker: Treating Satcha Storlie/Extender:[name] Weeks in Treatment: 0 Vital Signs Time Taken: 11:11 Temperature (F): 97.9 Height (in): 76 Pulse (bpm): 65 Source: Stated Respiratory Rate (breaths/min): 20 Weight (lbs): 227 Blood Pressure (mmHg): 135/58 Source: Stated Reference Range:  80 - 120 mg / dl Body Mass Index (BMI): 27.6 Electronic Signature(s) Signed: 01/18/2019 12:05:18 PM By: [name] Coria RN Entered By: [name] on 12/12/2018 11:12:35

## 2019-01-18 NOTE — Progress Notes (Signed)
[name], [name] ([id number]) Visit Report for 01/12/2019 Arrival Information Details Patient Name: Date of Service: [name], [name] 01/12/2019 11:15 AM Medical Record Number:[id number] Patient Account Number: [id number] Date of Birth/Sex: Treating RN: 10-31-1935 (83 y.o. [name] Primary Care [name]: Haynes Hoehn Other Clinician: Referring [name]: Treating [name]/Extender:[name], [name], [name]: 4 Visit Information History Since Last Visit Added or deleted any medications: No Patient Arrived: Wheel Chair Any new allergies or adverse reactions: No Arrival Time: 12:05 Had a fall or experienced change in No Accompanied By: family member activities of daily living that may affect Transfer Assistance: EasyPivot risk of falls: Patient Lift Signs or symptoms of abuse/neglect since last No Patient Identification Verified: Yes visito Secondary Verification Process Yes Hospitalized since last visit: No Completed: Implantable device outside of the clinic excluding No Patient Requires Transmission-Based No cellular tissue based products placed in the center Precautions: since last visit: Patient Has Alerts: No Has Dressing in Place as Prescribed: Yes Has Compression in Place as Prescribed: Yes Pain Present Now: No Electronic Signature(s) Signed: 01/18/2019 12:04:56 PM By: Kela Millin Entered By: Kela Millin on 01/12/2019 12:06:25 -------------------------------------------------------------------------------- Clinic Level of Care Assessment Details Patient Name: Date of Service: [name], [name] 01/12/2019 11:15 AM Medical Record Number:[id number] Patient Account Number: [id number] Date of Birth/Sex: Treating RN: 1935-02-28 (83 y.o. [name] Primary Care [name]: Haynes Hoehn Other Clinician: Referring [name]: Treating [name]/Extender:[name], [name], [name]: 4 Clinic Level of Care Assessment  Items TOOL 4 Quantity Score X - Use when only an EandM is performed on FOLLOW-UP visit 1 0 ASSESSMENTS - Nursing Assessment / Reassessment X - Reassessment of Co-morbidities (includes updates in patient status) 1 10 X - Reassessment of Adherence to Treatment Plan 1 5 ASSESSMENTS - Wound and Skin Assessment / Reassessment []  - Simple Wound Assessment / Reassessment - one wound 0 X - Complex Wound Assessment / Reassessment - multiple wounds 2 5 X - Dermatologic / Skin Assessment (not related to wound area) 1 10 ASSESSMENTS - Focused Assessment X - Circumferential Edema Measurements - multi extremities 2 5 X - Nutritional Assessment / Counseling / Intervention 1 10 []  - Lower Extremity Assessment (monofilament, tuning fork, pulses) 0 []  - Peripheral Arterial Disease Assessment (using hand held doppler) 0 ASSESSMENTS - Ostomy and/or Continence Assessment and Care []  - Incontinence Assessment and Management 0 []  - Ostomy Care Assessment and Management (repouching, etc.) 0 PROCESS - Coordination of Care []  - Simple Patient / Family Education for ongoing care 0 X - Complex (extensive) Patient / Family Education for ongoing care 1 20 X - Staff obtains Programmer, systems, Records, Test Results / Process Orders 1 10 X - Staff telephones HHA, Nursing Homes / Clarify orders / etc 1 10 []  - Routine Transfer to another Facility (non-emergent condition) 0 []  - Routine Hospital Admission (non-emergent condition) 0 []  - New Admissions / Biomedical engineer / Ordering NPWT, Apligraf, etc. 0 []  - Emergency Hospital Admission (emergent condition) 0 []  - Simple Discharge Coordination 0 X - Complex (extensive) Discharge Coordination 1 15 PROCESS - Special Needs []  - Pediatric / Minor Patient Management 0 []  - Isolation Patient Management 0 []  - Hearing / Language / Visual special needs 0 []  - Assessment of Community assistance (transportation, D/C planning, etc.) 0 []  - Additional assistance / Altered  mentation 0 []  - Support Surface(s) Assessment (bed, cushion, seat, etc.) 0 INTERVENTIONS - Wound Cleansing / Measurement []  - Simple Wound Cleansing - one wound 0 X -  Complex Wound Cleansing - multiple wounds 2 5 X - Wound Imaging (photographs - any number of wounds) 1 5 []  - Wound Tracing (instead of photographs) 0 []  - Simple Wound Measurement - one wound 0 X - Complex Wound Measurement - multiple wounds 2 5 INTERVENTIONS - Wound Dressings []  - Small Wound Dressing one or multiple wounds 0 []  - Medium Wound Dressing one or multiple wounds 0 X - Large Wound Dressing one or multiple wounds 2 20 []  - Application of Medications - topical 0 []  - Application of Medications - injection 0 INTERVENTIONS - Miscellaneous []  - External ear exam 0 []  - Specimen Collection (cultures, biopsies, blood, body fluids, etc.) 0 []  - Specimen(s) / Culture(s) sent or taken to Lab for analysis 0 []  - Patient Transfer (multiple staff / Civil Service fast streamer / Similar devices) 0 []  - Simple Staple / Suture removal (25 or less) 0 []  - Complex Staple / Suture removal (26 or more) 0 []  - Hypo / Hyperglycemic Management (close monitor of Blood Glucose) 0 []  - Ankle / Brachial Index (ABI) - do not check if billed separately 0 X - Vital Signs 1 5 Has the patient been seen at the hospital within the last three years: Yes Total Score: 180 Level Of Care: New/Established - Level 5 Electronic Signature(s) Signed: 01/12/2019 6:37:14 PM By: Deon Pilling Entered By: Deon Pilling on 01/12/2019 13:06:14 -------------------------------------------------------------------------------- Encounter Discharge Information Details Patient Name: Date of Service: [name] 01/12/2019 11:15 AM Medical Record Number:[id number] Patient Account Number: [id number] Date of Birth/Sex: Treating RN: [birth year] (83 y.o. [name] Primary Care Kashtyn Jankowski: Haynes Hoehn Other Clinician: Referring Tearsa Kowalewski: Treating  [name]/Extender:[name], [name], [name]: 4 Encounter Discharge Information Items Discharge Condition: Stable Ambulatory Status: Wheelchair Discharge Destination: Home Transportation: Private Auto Accompanied By: wife Schedule Follow-up Appointment: Yes Clinical Summary of Care: Patient Declined Electronic Signature(s) Signed: 01/16/2019 5:51:44 PM By: Levan Hurst RN, BSN Entered By: Levan Hurst on 01/12/2019 17:32:50 -------------------------------------------------------------------------------- Lower Extremity Assessment Details Patient Name: Date of Service: [name], [name] 01/12/2019 11:15 AM Medical Record Number:[id number] Patient Account Number: [id number] Date of Birth/Sex: Treating RN: Jun 28, 1935 (83 y.o. [name] Primary Care Arman Loy: Haynes Hoehn Other Clinician: Referring Nissan Frazzini: Treating Audry Pecina/Extender:[name], [name], [name]: 4 Edema Assessment Assessed: [Left: No] [Right: No] Edema: [Left: Yes] [Right: Yes] Calf Left: Right: Point of Measurement: 45 cm From Medial Instep 36.9 cm 37.7 cm Ankle Left: Right: Point of Measurement: 11 cm From Medial Instep 26 cm 26 cm Vascular Assessment Pulses: Dorsalis Pedis Palpable: [Left:Yes] [Right:Yes] Electronic Signature(s) Signed: 01/18/2019 12:04:56 PM By: Kela Millin Entered By: Kela Millin on 01/12/2019 12:18:22 -------------------------------------------------------------------------------- Multi Wound Chart Details Patient Name: Date of Service: [name] 01/12/2019 11:15 AM Medical Record Number:[id number] Patient Account Number: [id number] Date of Birth/Sex: Treating RN: [birth year] (83 y.o. M) Primary Care Elke Holtry: Haynes Hoehn Other Clinician: Referring Sheletha Bow: Treating Rachel Rison/Extender:[name], [name], [name]: 4 Vital Signs Height(in): 76 Pulse(bpm): 72 Weight(lbs): 227 Blood  Pressure(mmHg): 132/87 Body Mass Index(BMI): 28 Temperature(F): 97.6 Respiratory 18 Rate(breaths/min): Photos: [2:No Photos] [4:No Photos] [N/A:N/A] Wound Location: [2:Right Lower Leg] [4:Left Malleolus - Medial] [N/A:N/A] Wounding Event: [2:Gradually Appeared] [4:Gradually Appeared] [N/A:N/A] Primary Etiology: [2:Venous Leg Ulcer] [4:Venous Leg Ulcer] [N/A:N/A] Comorbid History: [2:Chronic Obstructive Pulmonary Disease (COPD), Coronary Artery (COPD), Coronary Artery Disease, Hypertension, Peripheral Venous Disease, Peripheral Venous Disease, Received Chemotherapy, Received Chemotherapy, Received Radiation]  [4:Chronic Obstructive Pulmonary Disease Disease, Hypertension, Received Radiation] [N/A:N/A] Date Acquired: [2:03/12/2018] [4:03/12/2018] [  N/A:N/A] Weeks of Treatment: [2:4] [4:4] [N/A:N/A] Wound Status: [2:Open] [4:Open] [N/A:N/A] Clustered Wound: [2:Yes] [4:No] [N/A:N/A] Measurements L x W x D 11x8x0.1 [4:1.2x4x0.1] [N/A:N/A] (cm) Area (cm) : [2:69.115] [4:3.77] [N/A:N/A] Volume (cm) : [7:6.546] [4:0.377] [N/A:N/A] % Reduction in Area: [2:79.40%] [4:48.90%] [N/A:N/A] % Reduction in Volume: 89.70% [4:74.50%] [N/A:N/A] Classification: [2:Full Thickness Without Exposed Support Structures Exposed Support Structures] [4:Full Thickness Without] [N/A:N/A] Exudate Amount: [2:Small] [4:Medium] [N/A:N/A] Exudate Type: [2:Serosanguineous] [4:Serosanguineous] [N/A:N/A] Exudate Color: [2:red, brown] [4:red, brown] [N/A:N/A] Wound Margin: [2:Distinct, outline attached] [4:Distinct, outline attached] [N/A:N/A] Granulation Amount: [2:Medium (34-66%)] [4:Medium (34-66%)] [N/A:N/A] Granulation Quality: [2:Red, Hyper-granulation] [4:Red, Pink, Hyper- granulation] [N/A:N/A] Necrotic Amount: [2:Medium (34-66%)] [4:Medium (34-66%)] [N/A:N/A] Necrotic Tissue: [2:Eschar, Adherent Slough] [4:Adherent Slough] [N/A:N/A] Exposed Structures: [2:Fat Layer (Subcutaneous Tissue) Exposed: Yes Fascia: No  Tendon: No Muscle: No Joint: No Bone: No Small (1-33%)] [4:Fat Layer (Subcutaneous Tissue) Exposed: Yes Fascia: No Tendon: No Muscle: No Joint: No Bone: No None] [N/A:N/A N/A] Treatment Notes Electronic Signature(s) Signed: 01/13/2019 7:57:15 AM By: Linton Ham MD Entered By: Linton Ham on 01/12/2019 13:29:34 -------------------------------------------------------------------------------- Multi-Disciplinary Care Plan Details Patient Name: Date of Service: [name] 01/12/2019 11:15 AM Medical Record Number:[id number] Patient Account Number: [id number] Date of Birth/Sex: Treating RN: 1936-01-29 (83 y.o. [name] Primary Care Habeeb Puertas: Haynes Hoehn Other Clinician: Referring Shanielle Correll: Treating Athena Baltz/Extender:[name], [name], [name]: 4 Active Inactive Venous Leg Ulcer Nursing Diagnoses: Knowledge deficit related to disease process and management Potential for venous Insuffiency (use before diagnosis confirmed) Goals: Patient/caregiver will verbalize understanding of disease process and disease management Date Initiated: 12/12/2018 Date Inactivated: 01/12/2019 Target Resolution Date: 01/13/2019 Goal Status: Met Verify adequate tissue perfusion prior to therapeutic compression application Date Initiated: 12/12/2018 Target Resolution Date: 02/10/2019 Goal Status: Active Interventions: Compression as ordered Provide education on venous insufficiency Notes: Wound/Skin Impairment Nursing Diagnoses: Impaired tissue integrity Knowledge deficit related to smoking impact on wound healing Knowledge deficit related to ulceration/compromised skin integrity Goals: Ulcer/skin breakdown will have a volume reduction of 30% by week 4 Date Initiated: 12/12/2018 Target Resolution Date: 02/03/2019 Goal Status: Active Interventions: Provide education on ulcer and skin care Notes: Electronic Signature(s) Signed: 01/12/2019 6:37:14 PM By: Deon Pilling Entered By: Deon Pilling on 01/12/2019 11:30:12 -------------------------------------------------------------------------------- Pain Assessment Details Patient Name: Date of Service: [name], [name] 01/12/2019 11:15 AM Medical Record Number:[id number] Patient Account Number: [id number] Date of Birth/Sex: Treating RN: January 31, 1936 (83 y.o. [name] Primary Care Jayjay Littles: Haynes Hoehn Other Clinician: Referring Onie Kasparek: Treating Babbie Dondlinger/Extender:[name], [name], [name]: 4 Active Problems Location of Pain Severity and Description of Pain Patient Has Paino No Site Locations Pain Management and Medication Current Pain Management: Electronic Signature(s) Signed: 01/18/2019 12:04:56 PM By: Kela Millin Entered By: Kela Millin on 01/12/2019 12:13:58 -------------------------------------------------------------------------------- Patient/Caregiver Education Details Patient Name: Date of Service: [name] 12/3/2020andnbsp11:15 AM Medical Record Number:[id number] Patient Account Number: [id number] Date of Birth/Gender: Treating RN: 1935-08-14 (83 y.o. [name] Primary Care Physician: Haynes Hoehn Other Clinician: Referring Physician: Treating Physician/Extender:[name], [name], Haynes Hoehn in Treatment: 4 Education Assessment Education Provided To: Patient Education Topics Provided Wound/Skin Impairment: Handouts: Skin Care Do's and Dont's Methods: Explain/Verbal Responses: Reinforcements needed Electronic Signature(s) Signed: 01/12/2019 6:37:14 PM By: Deon Pilling Entered By: Deon Pilling on 01/12/2019 11:30:23 -------------------------------------------------------------------------------- Wound Assessment Details Patient Name: Date of Service: [name], [name] 01/12/2019 11:15 AM Medical Record Number:[id number] Patient Account Number: [id number] Date of Birth/Sex: Treating RN: 11/19/1935 (83 y.o. [name] Primary Care Murad Staples: Haynes Hoehn  Other Clinician: Referring Beverely Suen: Treating Jere Vanburen/Extender:[name], [name], [name]: 4 Wound Status Wound Number: 2 Primary Venous Leg Ulcer Etiology: Wound Location: Right Lower Leg Wound Open Wounding Event: Gradually Appeared Status: Date Acquired: 03/12/2018 ComorbidChronic Obstructive Pulmonary Disease Weeks Of Treatment: 4 Weeks Of Treatment: 4 History: (COPD), Coronary Artery Disease, Clustered Wound: Yes Hypertension, Peripheral Venous Disease, Received Chemotherapy, Received Radiation Photos Wound Measurements Length: (cm) 11 % Reduct Width: (cm) 8 % Reduct Depth: (cm) 0.1 Epitheli Area: (cm) 69.115 Tunneli Volume: (cm) 6.912 Undermi Wound Description Classification: Full Thickness Without Exposed Support Foul Odo Structures Slough/F Wound Distinct, outline attached Margin: Exudate Small Amount: Exudate Serosanguineous Type: Exudate red, brown Color: Wound Bed Granulation Amount: Medium (34-66%) Granulation Quality: Red, Hyper-granulation Fascia E Necrotic Amount: Medium (34-66%) Fat Laye Necrotic Quality: Eschar, Adherent Slough Tendon E Muscle E Joint Ex Bone Exp r After Cleansing: No ibrino Yes Exposed Structure xposed: No r (Subcutaneous Tissue) Exposed: Yes xposed: No xposed: No posed: No osed: No ion in Area: 79.4% ion in Volume: 89.7% alization: Small (1-33%) ng: No ning: No Treatment Notes Wound #2 (Right Lower Leg) 1. Cleanse With Soap and water 2. Periwound Care Moisturizing lotion TCA Ointment 3. Primary Dressing Applied Calcium Alginate Ag 4. Secondary Dressing ABD Pad Dry Gauze 6. Support Layer Holiday representative) Signed: 01/17/2019 4:34:42 PM By: Mikeal Hawthorne EMT/HBOT Signed: 01/18/2019 12:04:56 PM By: Kela Millin Entered By: Mikeal Hawthorne on 01/16/2019  09:03:11 -------------------------------------------------------------------------------- Wound Assessment Details Patient Name: Date of Service: [name]. 01/12/2019 11:15 AM Medical Record Number:[id number] Patient Account Number: [id number] Date of Birth/Sex: Treating RN: 1935-08-27 (83 y.o. [name] Primary Care Venesha Petraitis: Haynes Hoehn Other Clinician: Referring Bellamy Rubey: Treating Doren Kaspar/Extender:[name], [name], [name]: 4 Wound Status Wound Number: 4 Primary Venous Leg Ulcer Etiology: Wound Location: Left Malleolus - Medial Wound Open Wounding Event: Gradually Appeared Status: Date Acquired: 03/12/2018 Comorbid Chronic Obstructive Pulmonary Disease Weeks Of Treatment: 4 History: (COPD), Coronary Artery Disease, Clustered Wound: No Hypertension, Peripheral Venous Disease, Received Chemotherapy, Received Radiation Photos Wound Measurements Length: (cm) 1.2 % Reduc Width: (cm) 4 % Reduc Depth: (cm) 0.1 Epithel Area: (cm) 3.77 Tunnel Volume: (cm) 0.377 Underm Wound Description Full Thickness Without Exposed Support Foul O Classification: Structures Slough Wound Distinct, outline attached Margin: Exudate Medium Amount: Exudate Serosanguineous Type: Exudate red, brown Color: Wound Bed Granulation Amount: Medium (34-66%) Granulation Quality: Red, Pink, Hyper-granulation Fascia Exp Necrotic Amount: Medium (34-66%) Fat Layer Necrotic Quality: Adherent Slough Tendon Exp Muscle Exp Joint Expo Bone Expos dor After Cleansing: No /Fibrino Yes Exposed Structure osed: No (Subcutaneous Tissue) Exposed: Yes osed: No osed: No sed: No ed: No tion in Area: 48.9% tion in Volume: 74.5% ialization: None ing: No ining: No Treatment Notes Wound #4 (Left, Medial Malleolus) 1. Cleanse With Soap and water 2. Periwound Care Moisturizing lotion TCA Ointment 3. Primary Dressing Applied Calcium Alginate Ag 4. Secondary  Dressing ABD Pad Dry Gauze 6. Support Layer Holiday representative) Signed: 01/17/2019 4:34:42 PM By: Mikeal Hawthorne EMT/HBOT Signed: 01/18/2019 12:04:56 PM By: Kela Millin Entered By: Mikeal Hawthorne on 01/16/2019 09:03:32 -------------------------------------------------------------------------------- Vitals Details Patient Name: Date of Service: [name], [name] 01/12/2019 11:15 AM Medical Record Number:[id number] Patient Account Number: [id number] Date of Birth/Sex: Treating RN: [birth year] (83 y.o. [name] Primary Care Danika Kluender: Haynes Hoehn Other Clinician: Referring Caresse Sedivy: Treating Keonna Raether/Extender:[name], [name], [name]: 4 Vital Signs Time Taken: 12:06 Temperature (F): 97.6 Height (in): 76 Pulse (bpm): 67  Weight (lbs): 227 Respiratory Rate (breaths/min): 18 Body Mass Index (BMI): 27.6 Blood Pressure (mmHg): 132/87 Reference Range: 80 - 120 mg / dl Electronic Signature(s) Signed: 01/18/2019 12:04:56 PM By: Kela Millin Entered By: Kela Millin on 01/12/2019 12:08:18

## 2019-01-18 NOTE — Progress Notes (Signed)
HICKS, KRIZMAN ([id number]) Visit Report for 12/12/2018 Chief Complaint Document Details Patient Name: Date of Service: [name], [name] 12/12/2018 10:30 AM Medical Record Number:[id number] Patient Account Number: [id number] Date of Birth/Sex: Treating RN: [birth year] (83 y.o. [name] Primary Care Provider: Haynes Hoehn Other Clinician: Referring Provider: Treating Provider/Extender:Malka Bocek, [name], [name]: 0 Information Obtained from: Patient Chief Complaint 12/12/2018; patient is here for review of wounds on his bilateral lower extremities Electronic Signature(s) Signed: 12/12/2018 5:57:33 PM By: Linton Ham MD Entered By: Linton Ham on 12/12/2018 13:36:14 -------------------------------------------------------------------------------- HPI Details Patient Name: Date of Service: [name] 12/12/2018 10:30 AM Medical Record Number:[id number] Patient Account Number: [id number] Date of Birth/Sex: Treating RN: 11/20/1935 (83 y.o. [name] Primary Care Provider: Haynes Hoehn Other Clinician: Referring Provider: Treating Provider/Extender:Presly Steinruck, [name], [name]: 0 History of Present Illness HPI Description: ADMISSION 12/12/2018 This is an 83 year old man who is a very complicated patient. He has apparently been followed at the wound care center at [facility] in [name] for a number of years with ulcers that have been described as secondary to chronic venous insufficiency with secondary lymphedema. His wife states that these will come and go she has been to that center multiple times. Most of the recent wounds have apparently been on the left leg. She states that at the end of September she started to see brown spots developing on the right leg which progressed and moved into necrotic areas on multiple areas of the right lower leg. Also spots on the dorsal feet. He started to develop generalized weakness  could not walk. He was admitted for 1 day in early October to [facility] but was discharged and told that he had a UTI. He was then admitted from 11/12/2018 through 11/22/2018. He was felt to have bilateral lower extremity cellulitis on the background of lymphedema and venous stasis ulceration. He was treated with broad-spectrum antibiotics. He was reviewed by Dr. Sharol Given and provided with some form of compression stocking although the patient states that the drainage from his wound stuck to these and cause damage to the skin when these were taken off. He has since been discharged to skilled facility associated with [facility]. The patient's wife is quite descriptive although unfortunately she did not actually take pictures of the wound development. She stated that they had never seen anything like this before. Then there was the deterioration with regards to his mobility. I am not sure that that is gotten any better. Past medical history; hypertension, BPH, coronary artery disease with stents, malignant tumor of the colon, abdominal aortic aneurysm followed with annual ultrasounds but I am not really sure who is following this ABIs in our clinic were 0.74 on the right 0.61 on the left Electronic Signature(s) Signed: 12/12/2018 5:57:33 PM By: Linton Ham MD Entered By: Linton Ham on 12/12/2018 13:42:17 -------------------------------------------------------------------------------- Physical Exam Details Patient Name: Date of Service: [name], [name] 12/12/2018 10:30 AM Medical Record Number:[id number] Patient Account Number: [id number] Date of Birth/Sex: Treating RN: [birth year] (83 y.o. [name] Primary Care Provider: Haynes Hoehn Other Clinician: Referring Provider: Treating Provider/Extender:Gerri Acre, [name], [name]: 0 Constitutional Sitting or standing Blood Pressure is within target range for patient.. Pulse regular and within target  range for patient.Marland Kitchen Respirations regular, non-labored and within target range.. Temperature is normal and within the target range for the patient.Marland Kitchen Appears in no distress. Eyes Conjunctivae clear. No discharge.no icterus. Respiratory work of breathing is normal. Bilateral  breath sounds are clear and equal in all lobes with no wheezes, rales or rhonchi.. Cardiovascular Heart rhythm and rate regular, without murmur or gallop. No murmurs. Popliteal and femoral pulses palpable. Pedal pulses were palpable. Mild edema. Gastrointestinal (GI) Periumbilical hernia nontender. No bruits no palpable AAA. No liver or spleen enlargement. Genitourinary (GU) No bladder distention. Integumentary (Hair, Skin) Patient has skin discoloration compatible with chronic venous hypertension hemosiderin deposition bilaterally. Neurological Reduced vibration sense to the level of the midcalf although he is not a diabetic. Psychiatric appears at normal baseline. Notes Wound exam; There are multiple wounds on the right lower extremity predominantly. Many of these are eschared angulated but not particularly painful. No attempt was made to debride any of this. There is also small spots that look like micro emboli they are not open. These included areas on his anterior lower extremity and foot. Similar areas on the left. On the left he has an area over his medial malleolus extending into the medial heel. This was also eschared. Electronic Signature(s) Signed: 12/12/2018 5:57:33 PM By: Linton Ham MD Entered By: Linton Ham on 12/12/2018 13:45:14 -------------------------------------------------------------------------------- Physician Orders Details Patient Name: Date of Service: Schenk, Dieter G. 12/12/2018 10:30 AM Medical Record Number:[id number] Patient Account Number: [id number] Date of Birth/Sex: Treating RN: [birth year] (83 y.o. [name] Primary Care Provider: Haynes Hoehn Other  Clinician: Referring Provider: Treating Provider/Extender:Teresina Bugaj, [name], [name]: 0 Verbal / Phone Orders: No Diagnosis Coding Follow-up Appointments Return Appointment in 2 weeks. Dressing Change Frequency Wound #1 Right Knee Change Dressing every other day. Wound #2 Right Lower Leg Change Dressing every other day. Wound #3 Left Knee Change Dressing every other day. Wound #4 Left,Medial Malleolus Change Dressing every other day. Skin Barriers/Peri-Wound Care Moisturizing lotion TCA Cream or Ointment Wound Cleansing May shower and wash wound with soap and water. - all wounds Primary Wound Dressing Wound #1 Right Knee Calcium Alginate with Silver Wound #2 Right Lower Leg Calcium Alginate with Silver Wound #3 Left Knee Calcium Alginate with Silver Wound #4 Left,Medial Malleolus Calcium Alginate with Silver Secondary Dressing Wound #1 Right Knee Dry Gauze ABD pad Wound #2 Right Lower Leg Dry Gauze ABD pad Wound #3 Left Knee Dry Gauze ABD pad Wound #4 Left,Medial Malleolus Dry Gauze ABD pad Edema Control Kerlix and Coban - Bilateral Off-Loading Turn and reposition every 2 hours Electronic Signature(s) Signed: 12/12/2018 5:57:33 PM By: Linton Ham MD Signed: 12/15/2018 5:16:49 PM By: Kela Millin Entered By: Kela Millin on 12/12/2018 13:03:51 -------------------------------------------------------------------------------- Problem List Details Patient Name: Date of Service: [name] 12/12/2018 10:30 AM Medical Record Number:[id number] Patient Account Number: [id number] Date of Birth/Sex: Treating RN: 1935-08-21 (83 y.o. [name] Primary Care Provider: Haynes Hoehn Other Clinician: Referring Provider: Treating Provider/Extender:Bradie Lacock, [name], [name]: 0 Active Problems ICD-10 Evaluated Encounter Code Description Active Date Today Diagnosis I87.333 Chronic venous hypertension  (idiopathic) with ulcer 12/12/2018 No Yes and inflammation of bilateral lower extremity L97.818 Non-pressure chronic ulcer of other part of right lower 12/12/2018 No Yes leg with other specified severity L97.518 Non-pressure chronic ulcer of other part of right foot 12/12/2018 No Yes with other specified severity L97.328 Non-pressure chronic ulcer of left ankle with other 12/12/2018 No Yes specified severity Inactive Problems Resolved Problems Electronic Signature(s) Signed: 12/12/2018 5:57:33 PM By: Linton Ham MD Entered By: Linton Ham on 12/12/2018 13:35:06 -------------------------------------------------------------------------------- Progress Note Details Patient Name: Date of Service: [name] 12/12/2018 10:30 AM Medical Record Number:[id number] Patient Account Number:  [id number] Date of Birth/Sex: Treating RN: 10-11-1935 (83 y.o. [name] Primary Care Provider: Haynes Hoehn Other Clinician: Referring Provider: Treating Provider/Extender:Nuri Larmer, [name], [name]: 0 Subjective Chief Complaint Information obtained from Patient 12/12/2018; patient is here for review of wounds on his bilateral lower extremities History of Present Illness (HPI) ADMISSION 12/12/2018 This is an 83 year old man who is a very complicated patient. He has apparently been followed at the wound care center at [facility] in [place] for a number of years with ulcers that have been described as secondary to chronic venous insufficiency with secondary lymphedema. His wife states that these will come and go she has been to that center multiple times. Most of the recent wounds have apparently been on the left leg. She states that at the end of September she started to see brown spots developing on the right leg which progressed and moved into necrotic areas on multiple areas of the right lower leg. Also spots on the dorsal feet. He started to develop generalized  weakness could not walk. He was admitted for 1 day in early October to [facility] but was discharged and told that he had a UTI. He was then admitted from 11/12/2018 through 11/22/2018. He was felt to have bilateral lower extremity cellulitis on the background of lymphedema and venous stasis ulceration. He was treated with broad-spectrum antibiotics. He was reviewed by Dr. Sharol Given and provided with some form of compression stocking although the patient states that the drainage from his wound stuck to these and cause damage to the skin when these were taken off. He has since been discharged to skilled facility associated with [facility]. The patient's wife is quite descriptive although unfortunately she did not actually take pictures of the wound development. She stated that they had never seen anything like this before. Then there was the deterioration with regards to his mobility. I am not sure that that is gotten any better. Past medical history; hypertension, BPH, coronary artery disease with stents, malignant tumor of the colon, abdominal aortic aneurysm followed with annual ultrasounds but I am not really sure who is following this ABIs in our clinic were 0.74 on the right 0.61 on the left Patient History Unable to Obtain Patient History due to Altered Mental Status. Information obtained from Patient. Allergies Sulfa (Sulfonamide Antibiotics) Family History No family history of Cancer, Diabetes, Heart Disease, Hereditary Spherocytosis, Hypertension, Kidney Disease, Lung Disease, Seizures, Stroke, Thyroid Problems, Tuberculosis. Social History Former smoker, Marital Status - Married, Alcohol Use - Rarely, Drug Use - No History, Caffeine Use - Daily. Medical History Eyes Denies history of Cataracts, Glaucoma, Optic Neuritis Ear/Nose/Mouth/Throat Denies history of Chronic sinus problems/congestion, Middle ear problems Hematologic/Lymphatic Denies history of Anemia, Hemophilia,  Human Immunodeficiency Virus, Lymphedema, Sickle Cell Disease Respiratory Patient has history of Chronic Obstructive Pulmonary Disease (COPD) Denies history of Aspiration, Asthma, Pneumothorax, Sleep Apnea, Tuberculosis Cardiovascular Patient has history of Coronary Artery Disease, Hypertension, Peripheral Venous Disease Denies history of Angina, Arrhythmia, Congestive Heart Failure, Deep Vein Thrombosis, Hypotension, Myocardial Infarction, Peripheral Arterial Disease, Phlebitis, Vasculitis Gastrointestinal Denies history of Cirrhosis , Colitis, Crohnoos, Hepatitis A, Hepatitis B, Hepatitis C Endocrine Denies history of Type I Diabetes, Type II Diabetes Genitourinary Denies history of End Stage Renal Disease Immunological Denies history of Lupus Erythematosus, Raynaudoos, Scleroderma Integumentary (Skin) Denies history of History of Burn Musculoskeletal Denies history of Gout, Rheumatoid Arthritis, Osteoarthritis, Osteomyelitis Neurologic Denies history of Dementia, Neuropathy, Quadriplegia, Paraplegia, Seizure Disorder Oncologic Patient has history of Received  Chemotherapy - 2015, Received Radiation - 2015 Psychiatric Denies history of Anorexia/bulimia, Confinement Anxiety Review of Systems (ROS) Constitutional Symptoms (General Health) Denies complaints or symptoms of Fatigue, Fever, Chills, Marked Weight Change. Eyes Complains or has symptoms of Glasses / Contacts. Denies complaints or symptoms of Dry Eyes, Vision Changes. Ear/Nose/Mouth/Throat Denies complaints or symptoms of Chronic sinus problems or rhinitis. Respiratory Denies complaints or symptoms of Chronic or frequent coughs, Shortness of Breath. Cardiovascular Denies complaints or symptoms of Chest pain. Gastrointestinal Denies complaints or symptoms of Frequent diarrhea, Nausea, Vomiting. Endocrine Denies complaints or symptoms of Heat/cold intolerance. Genitourinary Denies complaints or symptoms of Frequent  urination. Integumentary (Skin) Complains or has symptoms of Wounds. Musculoskeletal Denies complaints or symptoms of Muscle Pain, Muscle Weakness. Neurologic Denies complaints or symptoms of Numbness/parasthesias. Psychiatric Denies complaints or symptoms of Claustrophobia, Suicidal. Objective Constitutional Sitting or standing Blood Pressure is within target range for patient.. Pulse regular and within target range for patient.Marland Kitchen Respirations regular, non-labored and within target range.. Temperature is normal and within the target range for the patient.Marland Kitchen Appears in no distress. Vitals Time Taken: 11:11 AM, Height: 76 in, Source: Stated, Weight: 227 lbs, Source: Stated, BMI: 27.6, Temperature: 97.9 F, Pulse: 65 bpm, Respiratory Rate: 20 breaths/min, Blood Pressure: 135/58 mmHg. Eyes Conjunctivae clear. No discharge.no icterus. Respiratory work of breathing is normal. Bilateral breath sounds are clear and equal in all lobes with no wheezes, rales or rhonchi.. Cardiovascular Heart rhythm and rate regular, without murmur or gallop. No murmurs. Popliteal and femoral pulses palpable. Pedal pulses were palpable. Mild edema. Gastrointestinal (GI) Periumbilical hernia nontender. No bruits no palpable AAA. No liver or spleen enlargement. Genitourinary (GU) No bladder distention. Neurological Reduced vibration sense to the level of the midcalf although he is not a diabetic. Psychiatric appears at normal baseline. General Notes: Wound exam; ooThere are multiple wounds on the right lower extremity predominantly. Many of these are eschared angulated but not particularly painful. No attempt was made to debride any of this. There is also small spots that look like micro emboli they are not open. These included areas on his anterior lower extremity and foot. Similar areas on the left. ooOn the left he has an area over his medial malleolus extending into the medial heel. This was also  eschared. Integumentary (Hair, Skin) Patient has skin discoloration compatible with chronic venous hypertension hemosiderin deposition bilaterally. Wound #1 status is Open. Original cause of wound was Trauma. The wound is located on the Right Knee. The wound measures 2cm length x 2.5cm width x 0.1cm depth; 3.927cm^2 area and 0.393cm^3 volume. There is Fat Layer (Subcutaneous Tissue) Exposed exposed. There is no tunneling or undermining noted. There is a small amount of serosanguineous drainage noted. There is large (67-100%) red granulation within the wound bed. There is no necrotic tissue within the wound bed. Wound #2 status is Open. Original cause of wound was Gradually Appeared. The wound is located on the Right Lower Leg. The wound measures 22.5cm length x 19cm width x 0.2cm depth; 335.758cm^2 area and 67.152cm^3 volume. There is Fat Layer (Subcutaneous Tissue) Exposed exposed. There is no tunneling or undermining noted. There is a small amount of serosanguineous drainage noted. There is medium (34-66%) red granulation within the wound bed. There is a medium (34-66%) amount of necrotic tissue within the wound bed including Eschar and Adherent Slough. Wound #3 status is Open. Original cause of wound was Trauma. The wound is located on the Left Knee. The wound measures 1.3cm length x 1.4cm  width x 0.1cm depth; 1.429cm^2 area and 0.143cm^3 volume. There is Fat Layer (Subcutaneous Tissue) Exposed exposed. There is no tunneling or undermining noted. There is a small amount of serosanguineous drainage noted. There is large (67-100%) red granulation within the wound bed. Wound #4 status is Open. Original cause of wound was Gradually Appeared. The wound is located on the Left,Medial Malleolus. The wound measures 2cm length x 4.7cm width x 0.2cm depth; 7.383cm^2 area and 1.477cm^3 volume. There is Fat Layer (Subcutaneous Tissue) Exposed exposed. There is medium (34-66%) red, pink granulation within  the wound bed. There is a medium (34-66%) amount of necrotic tissue within the wound bed including Eschar and Adherent Slough. Assessment Active Problems ICD-10 Chronic venous hypertension (idiopathic) with ulcer and inflammation of bilateral lower extremity Non-pressure chronic ulcer of other part of right lower leg with other specified severity Non-pressure chronic ulcer of other part of right foot with other specified severity Non-pressure chronic ulcer of left ankle with other specified severity Plan Follow-up Appointments: Return Appointment in 2 weeks. Dressing Change Frequency: Wound #1 Right Knee: Change Dressing every other day. Wound #2 Right Lower Leg: Change Dressing every other day. Wound #3 Left Knee: Change Dressing every other day. Wound #4 Left,Medial Malleolus: Change Dressing every other day. Skin Barriers/Peri-Wound Care: Moisturizing lotion TCA Cream or Ointment Wound Cleansing: May shower and wash wound with soap and water. - all wounds Primary Wound Dressing: Wound #1 Right Knee: Calcium Alginate with Silver Wound #2 Right Lower Leg: Calcium Alginate with Silver Wound #3 Left Knee: Calcium Alginate with Silver Wound #4 Left,Medial Malleolus: Calcium Alginate with Silver Secondary Dressing: Wound #1 Right Knee: Dry Gauze ABD pad Wound #2 Right Lower Leg: Dry Gauze ABD pad Wound #3 Left Knee: Dry Gauze ABD pad Wound #4 Left,Medial Malleolus: Dry Gauze ABD pad Edema Control: Kerlix and Coban - Bilateral Off-Loading: Turn and reposition every 2 hours 1. I did not alter the primary dressing currently which is silver alginate. We put this in Curlex Coban. 2. Wrote a prescription for triamcinolone 1-4 and Cetaphil cream to put on his skin of his lower extremities with dressing changes. 3 the major question I have is the exact cause of what happened to his lower extremities in early October. Looking back on the pictures that are available to me  in Center link which included in the ER and also on Dr. Sharol Given as original consult note. 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 The major question would be why. The pattern of it seems most consistent with a microembolic phenomenon and I wondered about his abdominal aortic aneurysm. He did have an echocardiogram that was unremarkable although they did not see the aortic valve the visualized aortic root was normal. 4. I am not debating the fact that the patient has chronic venous insufficiency and lymphedema although the pattern of the wounds in the right lower extremity is not suggestive of this is a primary diagnosis 5. I will communicate with Dr. Donzetta Matters who was supposed to see him on Friday with regards to vein issues. I think he needs to be worked up for both macrovascular disease in his lower extremities and also to look at his abdominal aorta as a source of micro emboli. If this is not fruitful then I would wonder about either a vasculitis or vasculopathy this would require a biopsy. 6. I have asked the wife to take pictures if possible of any new lesions 7. I would have thought that this would have  been more painful although he does not have good vibration sense in either lower extremity although he is not a diabetic Electronic Signature(s) Signed: 12/12/2018 5:57:33 PM By: Linton Ham MD Entered By: Linton Ham on 12/12/2018 13:48:55 -------------------------------------------------------------------------------- HxROS Details Patient Name: Date of Service: [name] 12/12/2018 10:30 AM Medical Record Number:[id number] Patient Account Number: [id number] Date of Birth/Sex: Treating RN: 1935/12/03 (83 y.o. [name]) Carlene Coria Primary Care Provider: Haynes Hoehn Other Clinician: Referring Provider: Treating Provider/Extender:John Vasconcelos, [name], [name]: 0 Unable to Obtain Patient History due to Altered Mental Status Information Obtained  From Patient Constitutional Symptoms (General Health) Complaints and Symptoms: Negative for: Fatigue; Fever; Chills; Marked Weight Change Eyes Complaints and Symptoms: Positive for: Glasses / Contacts Negative for: Dry Eyes; Vision Changes Medical History: Negative for: Cataracts; Glaucoma; Optic Neuritis Ear/Nose/Mouth/Throat Complaints and Symptoms: Negative for: Chronic sinus problems or rhinitis Medical History: Negative for: Chronic sinus problems/congestion; Middle ear problems Respiratory Complaints and Symptoms: Negative for: Chronic or frequent coughs; Shortness of Breath Medical History: Positive for: Chronic Obstructive Pulmonary Disease (COPD) Negative for: Aspiration; Asthma; Pneumothorax; Sleep Apnea; Tuberculosis Cardiovascular Complaints and Symptoms: Negative for: Chest pain Medical History: Positive for: Coronary Artery Disease; Hypertension; Peripheral Venous Disease Negative for: Angina; Arrhythmia; Congestive Heart Failure; Deep Vein Thrombosis; Hypotension; Myocardial Infarction; Peripheral Arterial Disease; Phlebitis; Vasculitis Gastrointestinal Complaints and Symptoms: Negative for: Frequent diarrhea; Nausea; Vomiting Medical History: Negative for: Cirrhosis ; Colitis; Crohns; Hepatitis A; Hepatitis B; Hepatitis C Endocrine Complaints and Symptoms: Negative for: Heat/cold intolerance Medical History: Negative for: Type I Diabetes; Type II Diabetes Genitourinary Complaints and Symptoms: Negative for: Frequent urination Medical History: Negative for: End Stage Renal Disease Integumentary (Skin) Complaints and Symptoms: Positive for: Wounds Medical History: Negative for: History of Burn Musculoskeletal Complaints and Symptoms: Negative for: Muscle Pain; Muscle Weakness Medical History: Negative for: Gout; Rheumatoid Arthritis; Osteoarthritis; Osteomyelitis Neurologic Complaints and Symptoms: Negative for: Numbness/parasthesias Medical  History: Negative for: Dementia; Neuropathy; Quadriplegia; Paraplegia; Seizure Disorder Psychiatric Complaints and Symptoms: Negative for: Claustrophobia; Suicidal Medical History: Negative for: Anorexia/bulimia; Confinement Anxiety Hematologic/Lymphatic Medical History: Negative for: Anemia; Hemophilia; Human Immunodeficiency Virus; Lymphedema; Sickle Cell Disease Immunological Medical History: Negative for: Lupus Erythematosus; Raynauds; Scleroderma Oncologic Medical History: Positive for: Received Chemotherapy - 2015; Received Radiation - 2015 Immunizations Pneumococcal Vaccine: Received Pneumococcal Vaccination: No Implantable Devices None Family and Social History Cancer: No; Diabetes: No; Heart Disease: No; Hereditary Spherocytosis: No; Hypertension: No; Kidney Disease: No; Lung Disease: No; Seizures: No; Stroke: No; Thyroid Problems: No; Tuberculosis: No; Former smoker; Marital Status - Married; Alcohol Use: Rarely; Drug Use: No History; Caffeine Use: Daily; Financial Concerns: No; Food, Clothing or Shelter Needs: No; Support System Lacking: No; Transportation Concerns: No Electronic Signature(s) Signed: 12/12/2018 5:57:33 PM By: Linton Ham MD Signed: 01/18/2019 12:05:18 PM By: Carlene Coria RN Entered By: Carlene Coria on 12/12/2018 11:26:52 -------------------------------------------------------------------------------- SuperBill Details Patient Name: Date of Service: [name] 12/12/2018 Medical Record Number:[id number] Patient Account Number: [id number] Date of Birth/Sex: Treating RN: 10-10-1935 (83 y.o. 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 Facility Procedures The patient  participates with Medicare or their insurance follows the Medicare Facility Guidelines: CPT4 Code Description Modifier Quantity [id number] [place] VISIT-LEV 5 EST PT 1 Physician Procedures CPT4: Code [id number] Description: 204 - WC PHYS LEVEL 4 - NEW PT ICD-10 Diagnosis Description I87.333 Chronic venous hypertension (idiopathic) with ulcer and infl lower extremity L97.818 Non-pressure chronic ulcer of other part of right lower leg severity L97.518  Non-pressure chronic ulcer of other part of right foot with L97.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

## 2019-01-25 ENCOUNTER — Institutional Professional Consult (permissible substitution): Payer: Medicare Other | Admitting: Pulmonary Disease

## 2019-01-26 ENCOUNTER — Other Ambulatory Visit: Payer: Self-pay

## 2019-01-26 ENCOUNTER — Encounter (HOSPITAL_BASED_OUTPATIENT_CLINIC_OR_DEPARTMENT_OTHER): Payer: Medicare Other | Admitting: Internal Medicine

## 2019-01-26 DIAGNOSIS — L97818 Non-pressure chronic ulcer of other part of right lower leg with other specified severity: Secondary | ICD-10-CM | POA: Diagnosis not present

## 2019-01-26 NOTE — Progress Notes (Addendum)
[name], [name] ([id number]) Visit Report for 01/26/2019 Arrival Information Details Patient Name: Date of Service: [name], [name] 01/26/2019 10:15 AM Medical Record Number:[id number] Patient Account Number: [id number] Date of Birth/Sex: Treating RN: 03/22/1935 (83 y.o. [name] Primary Care [name]: [name] Other Clinician: Referring [name]: Treating [name]/Extender:[name] [name] in Treatment: 6 Visit Information History Since Last Visit Added or deleted any medications: No Patient Arrived: Wheel Chair Any new allergies or adverse reactions: No Arrival Time: 10:37 Had a fall or experienced change in No Accompanied [name]: family member activities of daily living that may affect Transfer Assistance: EasyPivot risk of falls: Patient Lift Signs or symptoms of abuse/neglect since last No Patient Identification Verified: Yes visito Secondary Verification Process Yes Hospitalized since last visit: No Completed: Implantable device outside of the clinic excluding No Patient Requires Transmission-Based No cellular tissue based products placed in the center Precautions: since last visit: Patient Has Alerts: No Has Dressing in Place as Prescribed: Yes Has Compression in Place as Prescribed: Yes Pain Present Now: No Electronic Signature(s) Signed: 01/26/2019 5:33:40 PM [name]: [name]: [name] on 01/26/2019 10:39:52 -------------------------------------------------------------------------------- Encounter Discharge Information Details Patient Name: Date of Service: [name] 01/26/2019 10:15 AM Medical Record Number:[id number] Patient Account Number: [id number] Date of Birth/Sex: Treating RN: [birth year] (83 y.o. [name] Primary Care Samora Jernberg: [name] Other Clinician: Referring [name]: Treating [name]:[name] [name] in Treatment: 6 Encounter Discharge  Information Items Post Procedure Vitals Discharge Condition: Stable Temperature (F): 97.8 Ambulatory Status: Wheelchair Pulse (bpm): 65 Discharge Destination: Home Respiratory Rate (breaths/min): 19 Transportation: Private Auto Blood Pressure (mmHg): 133/60 Accompanied [name]: wife Schedule Follow-up Appointment: Yes Clinical Summary of Care: Patient Declined Electronic Signature(s) Signed: 01/26/2019 5:32:06 PM [name]: [name] Entered [name]: [name] on 01/26/2019 13:27:48 -------------------------------------------------------------------------------- Lower Extremity Assessment Details Patient Name: Date of Service: [name] 01/26/2019 10:15 AM Medical Record Number:[id number] Patient Account Number: [id number] Date of Birth/Sex: Treating RN: [birth year] (83 y.o. [name] Primary Care [name]: [name] Other Clinician: Referring [name]: Treating [name]:[name] [name] in Treatment: 6 Edema Assessment Assessed: [Left: No] [Right: No] Edema: [Left: Yes] [Right: Yes] Calf Left: Right: Point of Measurement: 45 cm From Medial Instep 38 cm 36.5 cm Ankle Left: Right: Point of Measurement: 11 cm From Medial Instep 26 cm 27.5 cm Vascular Assessment Pulses: Dorsalis Pedis Palpable: [Left:Yes] [Right:Yes] Electronic Signature(s) Signed: 01/26/2019 5:33:40 PM [name]: [name]: [name] on 01/26/2019 10:47:15 -------------------------------------------------------------------------------- Multi Wound Chart Details Patient Name: Date of Service: [name] 01/26/2019 10:15 AM Medical Record Number:[id number] Patient Account Number: [id number] Date of Birth/Sex: Treating RN: December 24, 1935 (83 y.o. M) Primary Care [name]: Other Clinician: Haynes [name] Referring [name]: Treating [name]/Extender:[name] [name] in Treatment: 6 Vital Signs Height(in): 48 Pulse(bpm):  33 Weight(lbs): 1 Blood Pressure(mmHg): 133/60 Body Mass Index(BMI): 28 Temperature(F): 97.8 Respiratory 19 Rate(breaths/min): Photos: [2:No Photos] [4:No Photos] [N/A:N/A] Wound Location: [2:Right Lower Leg] [4:Left Malleolus - Medial] [N/A:N/A] Wounding Event: [2:Gradually Appeared] [4:Gradually Appeared] [N/A:N/A] Primary Etiology: [2:Venous Leg Ulcer] [4:Venous Leg Ulcer] [N/A:N/A] Comorbid History: [2:Chronic Obstructive Pulmonary Disease (COPD), Coronary Artery (COPD), Coronary Artery Disease, Hypertension, Peripheral Venous Disease, Peripheral Venous Disease, Received Chemotherapy, Received Chemotherapy, Received Radiation]  [4:Chronic Obstructive Pulmonary Disease Disease, Hypertension, Received Radiation] [N/A:N/A] Date Acquired: [2:03/12/2018] [4:03/12/2018] [N/A:N/A] [name] of Treatment: [2:6] [4:6] [N/A:N/A] Wound Status: [2:Open] [4:Open] [N/A:N/A] Clustered Wound: [2:Yes] [4:No] [N/A:N/A] Clustered Quantity: [2:3] [4:N/A] [N/A:N/A] Measurements L x W  x D 6.5x5x0.1 [4:0.2x0.4x0.1] [N/A:N/A] (cm) Area (cm) : [2:25.525] [4:0.063] [N/A:N/A] Volume (cm) : [2:2.553] [4:0.006] [N/A:N/A] % Reduction in Area: [2:92.40%] [4:99.10%] [N/A:N/A] % Reduction in Volume: 96.20% [4:99.60%] [N/A:N/A] Classification: [2:Full Thickness Without Exposed Support Structures Exposed Support Structures] [4:Full Thickness Without] [N/A:N/A] Exudate Amount: [2:Small] [4:Medium] [N/A:N/A] Exudate Type: [2:Serosanguineous] [4:Serosanguineous] [N/A:N/A] Exudate Color: [2:red, brown] [4:red, brown] [N/A:N/A] Wound Margin: [2:Distinct, outline attached Distinct, outline attached N/A] Granulation Amount: [2:Medium (34-66%)] [4:Medium (34-66%)] [N/A:N/A] Granulation Quality: [2:Red, Hyper-granulation] [4:Red, Pink] [N/A:N/A] Necrotic Amount: [2:Medium (34-66%)] [4:None Present (0%)] [N/A:N/A] Necrotic Tissue: [2:Eschar, Adherent Slough N/A] [N/A:N/A] Exposed Structures: [2:Fat Layer (Subcutaneous Fat  Layer (Subcutaneous N/A Tissue) Exposed: Yes Fascia: No Tendon: No Muscle: No Joint: No Bone: No] [4:Tissue) Exposed: Yes Fascia: No Tendon: No Muscle: No Joint: No Bone: No] Epithelialization: [2:Small (1-33%)] [4:Medium (34-66%)] [N/A:N/A] Debridement: [2:Debridement - Excisional N/A] [N/A:N/A] Pre-procedure [2:11:30] [4:N/A] [N/A:N/A] Verification/Time Out Taken: Pain Control: [2:Lidocaine 4% Topical Solution] [4:N/A] [N/A:N/A] Tissue Debrided: [2:Subcutaneous, Slough] [4:N/A] Level: [2:Skin/Subcutaneous Tissue] [4:N/A] Debridement Area (sq cm):20 [4:N/A] Instrument: [2:Curette] [4:N/A] Bleeding: [2:Moderate] [4:N/A] Hemostasis Achieved: [2:Pressure] [4:N/A] Procedural Pain: [2:0] [4:N/A] Post Procedural Pain: [2:3] [4:N/A] Debridement Treatment Procedure was tolerated [4:N/A] Response: [2:well] Post Debridement [2:6.5x5x0.1] [4:N/A] Measurements L x W x D (cm) Post Debridement [2:2.553] [4:N/A] Volume: (cm) Procedures Performed: Debridement [4:N/A] Treatment Notes Electronic Signature(s) Signed: 01/26/2019 5:01:00 PM [name]: Linton Ham MD Entered [name]: Linton Ham on 01/26/2019 12:40:09 -------------------------------------------------------------------------------- Multi-Disciplinary Care Plan Details Patient Name: Date of Service: [name] 01/26/2019 10:15 AM Medical Record Number:[id number] Patient Account Number: [id number] Date of Birth/Sex: Treating RN: 1936/01/17 (83 y.o. [name] Primary Care Merrie Epler: [name] Other Clinician: Referring Bekka Qian: Treating Agatha Duplechain/Extender:[name] [name] in Treatment: 6 Active Inactive Venous Leg Ulcer Nursing Diagnoses: Knowledge deficit related to disease process and management Potential for venous Insuffiency (use before diagnosis confirmed) Goals: Patient/caregiver will verbalize understanding of disease process and disease management Date Initiated: 12/12/2018 Date Inactivated:  01/12/2019 Target Resolution Date: 01/13/2019 Goal Status: Met Verify adequate tissue perfusion prior to therapeutic compression application Date Initiated: 12/12/2018 Target Resolution Date: 02/10/2019 Goal Status: Active Interventions: Compression as ordered Provide education on venous insufficiency Notes: Wound/Skin Impairment Nursing Diagnoses: Impaired tissue integrity Knowledge deficit related to smoking impact on wound healing Knowledge deficit related to ulceration/compromised skin integrity Goals: Ulcer/skin breakdown will have a volume reduction of 30% [name] week 4 Date Initiated: 12/12/2018 Target Resolution Date: 02/03/2019 Goal Status: Active Interventions: Provide education on ulcer and skin care Notes: Electronic Signature(s) Signed: 01/26/2019 5:34:18 PM [name]: Deon Pilling Entered [name]: Deon Pilling on 01/26/2019 10:38:38 -------------------------------------------------------------------------------- Pain Assessment Details Patient Name: Date of Service: [name] 01/26/2019 10:15 AM Medical Record Number:[id number] Patient Account Number: [id number] Date of Birth/Sex: Treating RN: 11-Jul-1935 (83 y.o. [name] Primary Care Analeah Brame: [name] Other Clinician: Referring Shabre Kreher: Treating Jeryl Umholtz/Extender:[name] [name] in Treatment: 6 Active Problems Location of Pain Severity and Description of Pain Patient Has Paino No Site Locations Pain Management and Medication Current Pain Management: Electronic Signature(s) Signed: 01/26/2019 5:33:40 PM [name]: [name]: [name] on 01/26/2019 10:40:21 -------------------------------------------------------------------------------- Patient/Caregiver Education Details Patient Name: Date of Service: [name] 12/17/2020andnbsp10:15 AM Medical Record Number:[id number] Patient Account Number: [id number] Date of Birth/Gender: Treating RN: 17-May-1935 (83  y.o. [name] Primary Care Physician: [name] Other Clinician: Referring Physician: Treating Physician/Extender:[name], [name] in Treatment: 6 Education Assessment Education Provided To: Patient Education Topics Provided Wound/Skin Impairment: Handouts: Skin Care  Do's and Dont's Methods: Explain/Verbal Responses: Reinforcements needed Electronic Signature(s) Signed: 01/26/2019 5:34:18 PM [name]: Deon Pilling Entered [name]: Deon Pilling on 01/26/2019 10:38:48 -------------------------------------------------------------------------------- Wound Assessment Details Patient Name: Date of Service: [name], [name] 01/26/2019 10:15 AM Medical Record Number:[id number] Patient Account Number: [id number] Date of Birth/Sex: Treating RN: September 20, 1935 (83 y.o. [name] Primary Care Ladye Macnaughton: [name] Other Clinician: Referring Leotha Voeltz: Treating Deshaun Schou/Extender:[name] [name] in Treatment: 6 Wound Status Wound Number: 2 Primary Venous Leg Ulcer Etiology: Etiology: Wound Location: Right Lower Leg Wound Open Wounding Event: Gradually Appeared Status: Date Acquired: 03/12/2018 Comorbid Chronic Obstructive Pulmonary Disease [name] Of Treatment: 6 History: (COPD), Coronary Artery Disease, Clustered Wound: Yes Hypertension, Peripheral Venous Disease, Received Chemotherapy, Received Radiation Photos Wound Measurements Length: (cm) 6.5 % Reduct Width: (cm) 5 % Reduct Depth: (cm) 0.1 Epitheli Clustered Quantity: 3 Tunnelin Area: (cm) 25.525 Undermi Volume: (cm) 2.553 Wound Description Classification: Full Thickness Without Exposed Support Foul Odo Structures Slough/F Wound Distinct, outline attached Margin: Exudate Small Amount: Exudate Serosanguineous Type: Exudate red, brown Color: Wound Bed Granulation Amount: Medium (34-66%) Granulation Quality: Red, Hyper-granulation Fascia E Necrotic Amount: Medium  (34-66%) Fat Laye Necrotic Quality: Eschar, Adherent Slough Tendon E Muscle E Joint Ex Bone Exp r After Cleansing: No ibrino Yes Exposed Structure xposed: No r (Subcutaneous Tissue) Exposed: Yes xposed: No xposed: No posed: No osed: No ion in Area: 92.4% ion in Volume: 96.2% alization: Small (1-33%) g: No ning: No Treatment Notes Wound #2 (Right Lower Leg) 1. Cleanse With Soap and water 2. Periwound Care Moisturizing lotion TCA Cream 3. Primary Dressing Applied Calcium Alginate Ag 4. Secondary Dressing Dry Gauze 6. Support Layer Holiday representative) Signed: 01/30/2019 6:06:17 PM [name]: Mikeal Hawthorne EMT/HBOT Signed: 02/01/2019 4:54:21 PM [name]: [name] Previous Signature: 01/26/2019 5:33:40 PM Version [name]: [name]: Mikeal Hawthorne on 01/30/2019 17:07:47 -------------------------------------------------------------------------------- Wound Assessment Details Patient Name: Date of Service: [name]. 01/26/2019 10:15 AM Medical Record Number:[id number] Patient Account Number: [id number] Date of Birth/Sex: Treating RN: 04/06/1935 (83 y.o. [name] Primary Care Aleira Deiter: [name] Other Clinician: Referring Kayloni Rocco: Treating Kaci Freel/Extender:[name] [name] in Treatment: 6 Wound Status Wound Number: 4 Primary Venous Leg Ulcer Etiology: Wound Location: Left Malleolus - Medial Wound Open Wounding Event: Gradually Appeared Status: Date Acquired: 03/12/2018 Comorbid Chronic Obstructive Pulmonary Disease [name] Of Treatment: 6 History: (COPD), Coronary Artery Disease, Clustered Wound: No Hypertension, Peripheral Venous Disease, Received Chemotherapy, Received Radiation Photos Wound Measurements Length: (cm) 0.2 % Reduc Width: (cm) 0.4 % Reduc Depth: (cm) 0.1 Epithel Area: (cm) 0.063 Tunnel Volume: (cm) 0.006 Underm Wound Description Classification: Full Thickness Without  Exposed Support Foul O Structures Slough Wound Distinct, outline attached Margin: Exudate Medium Medium Amount: Exudate Serosanguineous Type: Exudate red, brown Color: Wound Bed Granulation Amount: Medium (34-66%) Granulation Quality: Red, Pink Fascia Exp Necrotic Amount: None Present (0%) Fat Layer Tendon Exp Muscle Exp Joint Expo Bone Expos dor After Cleansing: No /Fibrino No Exposed Structure osed: No (Subcutaneous Tissue) Exposed: Yes osed: No osed: No sed: No ed: No tion in Area: 99.1% tion in Volume: 99.6% ialization: Medium (34-66%) ing: No ining: No Treatment Notes Wound #4 (Left, Medial Malleolus) 1. Cleanse With Soap and water 2. Periwound Care Moisturizing lotion TCA Cream 3. Primary Dressing Applied Calcium Alginate Ag 4. Secondary Dressing Dry Gauze 6. Support Layer Holiday representative) Signed: 01/30/2019 6:06:17 PM [name]: Mikeal Hawthorne EMT/HBOT Signed: 02/01/2019 4:54:21 PM [name]: [name] Previous Signature: 01/26/2019 5:33:40 PM  Version [name]: [name]: Mikeal Hawthorne on 01/30/2019 17:07:18 -------------------------------------------------------------------------------- Vitals Details Patient Name: Date of Service: [name], [name] 01/26/2019 10:15 AM Medical Record Number:[id number] Patient Account Number: [id number] Date of Birth/Sex: Treating RN: Apr 22, 1935 (83 y.o. [name] Primary Care Shermika Balthaser: [name] Other Clinician: Referring Antony Sian: Treating Tambria Pfannenstiel/Extender:[name] [name] in Treatment: 6 Vital Signs Time Taken: 10:35 Temperature (F): 97.8 Height (in): 76 Pulse (bpm): 65 Weight (lbs): 227 Respiratory Rate (breaths/min): 19 Body Mass Index (BMI): 27.6 Blood Pressure (mmHg): 133/60 Reference Range: 80 - 120 mg / dl Electronic Signature(s) Signed: 01/26/2019 5:33:40 PM [name]: [name]: [name] on 01/26/2019  10:40:14

## 2019-01-26 NOTE — Progress Notes (Signed)
[name], [name] ([id number]) Visit Report for 01/26/2019 Debridement Details Patient Name: Date of Service: [name], [name] 01/26/2019 10:15 AM Medical Record Number:[id number] Patient Account Number: [id number] Date of Birth/Sex: 1936/01/03 (83 y.o. M) Treating RN: Primary Care Provider: Haynes Hoehn Other Clinician: Referring Provider: Treating Provider/Extender:[name], [name], [name]: 6 Debridement Performed for Wound #2 Right Lower Leg Assessment: Performed By: Physician Ricard Dillon., MD Debridement Type: Debridement Severity of Tissue Pre Fat layer exposed Debridement: Level of Consciousness (Pre- Awake and Alert procedure): Pre-procedure Verification/Time Out Taken: Yes - 11:30 Start Time: 11:31 Pain Control: Lidocaine 4% Topical Solution Total Area Debrided (L x W): 5 (cm) x 4 (cm) = 20 (cm) Tissue and other material Viable, Non-Viable, Slough, Subcutaneous, Skin: Dermis , Fibrin/Exudate, Slough debrided: Level: Skin/Subcutaneous Tissue Debridement Description: Excisional Instrument: Curette Bleeding: Moderate Hemostasis Achieved: Pressure End Time: 11:36 Procedural Pain: 0 Post Procedural Pain: 3 Response to Treatment: Procedure was tolerated well Level of Consciousness Awake and Alert (Post-procedure): Post Debridement Measurements of Total Wound Length: (cm) 6.5 Width: (cm) 5 Depth: (cm) 0.1 Volume: (cm) 2.553 Character of Wound/Ulcer Post Improved Debridement: Severity of Tissue Post Debridement: Fat layer exposed Post Procedure Diagnosis Same as Pre-procedure Electronic Signature(s) Signed: 01/26/2019 5:01:00 PM By: [name] Ham MD Entered By: [name] on 01/26/2019 12:40:28 -------------------------------------------------------------------------------- HPI Details Patient Name: Date of Service: [name] 01/26/2019 10:15 AM Medical Record Number:[id number] Patient Account Number: [id number] Date of  Birth/Sex: Treating RN: September 07, 1935 (83 y.o. M) Primary Care Provider: Haynes Hoehn Other Clinician: Referring Provider: Treating Provider/Extender:[name], [name], [name]: 6 History of Present Illness HPI Description: ADMISSION 12/12/2018 This is an 83 year old man who is a very complicated patient. He has apparently been followed at the wound care center at [facility] in [place] for a number of years with ulcers that have been described as secondary to chronic venous insufficiency with secondary lymphedema. His wife states that these will come and go she has been to that center multiple times. Most of the recent wounds have apparently been on the left leg. She states that at the end of September she started to see brown spots developing on the right leg which progressed and moved into necrotic areas on multiple areas of the right lower leg. Also spots on the dorsal feet. He started to develop generalized weakness could not walk. He was admitted for 1 day in early October to [facility] but was discharged and told that he had a UTI. He was then admitted from 11/12/2018 through 11/22/2018. He was felt to have bilateral lower extremity cellulitis on the background of lymphedema and venous stasis ulceration. He was treated with broad-spectrum antibiotics. He was reviewed by Dr. Sharol Given and provided with some form of compression stocking although the patient states that the drainage from his wound stuck to these and cause damage to the skin when these were taken off. He has since been discharged to skilled facility associated with [facility]. The patient's wife is quite descriptive although unfortunately she did not actually take pictures of the wound development. She stated that they had never seen anything like this before. Then there was the deterioration with regards to his mobility. I am not sure that that is gotten any better. Past medical history;  hypertension, BPH, coronary artery disease with stents, malignant tumor of the colon, abdominal aortic aneurysm followed with annual ultrasounds but I am not really sure who is following this ABIs in our clinic were 0.74 on  the right 0.61 on the left 11/16; patient's appointment with Dr. Donzetta Matters of vascular surgery is not till 10/23. I did put in a secure text message about this patient. He comes in today with some multiple wound areas on the right leg looking a lot better. Most substantially the wounds are located on the right lateral lower leg. On the left there is the left medial calcaneus. The patient clearly has chronic venous insufficiency with secondary lymphedema however I wondered whether he had macrovascular disease and/or some of the damage on the right leg could be related to a vasculopathy. In any case today things look substantially better than last week. The patient is still at [facility] skilled facility 12/3; since the patient was last here 2-1/2 weeks ago he is been admitted to the hospital for procedure by Dr. Donzetta Matters. At some point he was also found to have a DVT in the right femoral vein. He is on anticoagulation. He underwent aortogram with bilateral lower extremity angiograms on 01/09/2019. This showed the aorta and iliac segments to be tortuous but no flow-limiting stenosis. Bilateral he has SFA nonlimiting stenosis although heavily calcified. He has took two-vessel runoff bilaterally which are quite large vessels. From the tone of this note I really did not think that there was felt to be any macrovascular stenosis. This leaves the initial appearance of his legs with multiple right greater than left lower extremity punched out wounds somewhat difficult to explain in my mind. I do not think this had anything to do with venous disease either reflux or clots In the meantime his legs are actually doing quite better. We have been using silver alginate Curlex and Coban. He was discharged  from [facility] and is now at home. He is actually doing quite well 12/17 the patient has a small remaining area on the left medial ankle. 3 areas on the right lateral calf that still requiring debridement. We have been using silver alginate. Electronic Signature(s) Signed: 01/26/2019 5:01:00 PM By: [name] Ham MD Entered By: [name] on 01/26/2019 12:44:57 -------------------------------------------------------------------------------- Physical Exam Details Patient Name: Date of Service: [name] 01/26/2019 10:15 AM Medical Record Number:[id number] Patient Account Number: [id number] Date of Birth/Sex: Treating RN: 1935/07/24 (83 y.o. M) Primary Care Provider: Haynes Hoehn Other Clinician: Referring Provider: Treating Provider/Extender:[name], [name], [name]: 6 Constitutional Sitting or standing Blood Pressure is within target range for patient.. Pulse regular and within target range for patient.Marland Kitchen Respirations regular, non-labored and within target range.. Temperature is normal and within the target range for the patient.Marland Kitchen Appears in no distress. Notes Wound exam; the patient leg continues to improve. He has changes of chronic venous insufficiency with lymphedema. The area on the left medial ankle looks as though it is closing. He has 3 areas on the right lateral calf to have necrotic surfaces. Very difficult to debride. Hemostasis with direct pressure. He has tightly adherent fibrinous debris over these wound Electronic Signature(s) Signed: 01/26/2019 5:01:00 PM By: [name] Ham MD Entered By: [name] on 01/26/2019 12:48:09 -------------------------------------------------------------------------------- Physician Orders Details Patient Name: Date of Service: [name] 01/26/2019 10:15 AM Medical Record Number:[id number] Patient Account Number: [id number] Date of Birth/Sex: Treating RN: 1935/08/09 (83 y.o. [name] Primary Care Provider: Haynes Hoehn Other Clinician: Referring Provider: Treating Provider/Extender:[name], [name], [name]: 6 Verbal / Phone Orders: No Diagnosis Coding ICD-10 Coding Code Description I87.333 Chronic venous hypertension (idiopathic) with ulcer and inflammation of bilateral lower extremity L97.818 Non-pressure  chronic ulcer of other part of right lower leg with other specified severity L97.518 Non-pressure chronic ulcer of other part of right foot with other specified severity L97.328 Non-pressure chronic ulcer of left ankle with other specified severity Follow-up Appointments Return Appointment in 2 weeks. Dressing Change Frequency Wound #2 Right Lower Leg Other: - change twice a week. wound center every two weeks. Wound #4 Left,Medial Malleolus Other: - change twice a week. wound center every two weeks. Skin Barriers/Peri-Wound Care Wound #4 Left,Medial Malleolus TCA Cream or Ointment - both legs - mixed with lotion in clinic. Patient to use the TCA/cetaphil lotion mix at home with dressing changes. Wound Cleansing Wound #2 Right Lower Leg May shower and wash wound with soap and water. - on days that dressing is changed Wound #4 Left,Medial Malleolus May shower and wash wound with soap and water. - on days that dressing is changed Primary Wound Dressing Wound #2 Right Lower Leg Calcium Alginate with Silver Wound #4 Left,Medial Malleolus Calcium Alginate with Silver Secondary Dressing Wound #2 Right Lower Leg Dry Gauze ABD pad Wound #4 Left,Medial Malleolus Dry Gauze ABD pad Edema Control Kerlix and Coban - Bilateral Avoid standing for long periods of time Elevate legs to the level of the heart or above for 30 minutes daily and/or when sitting, a frequency of: - throughout the day Off-Loading Turn and reposition every 2 hours [facility] [place] skilled nursing for wound care. - [facility] home [facility]  change twice a week. wound center every two weeks. Electronic Signature(s) Signed: 01/26/2019 5:01:00 PM By: [name] Ham MD Signed: 01/26/2019 5:34:18 PM By: Deon Pilling Entered By: Deon Pilling on 01/26/2019 11:37:31 -------------------------------------------------------------------------------- Problem List Details Patient Name: Date of Service: [name] 01/26/2019 10:15 AM Medical Record Number:[id number] Patient Account Number: [id number] Date of Birth/Sex: Treating RN: 12/19/1935 (83 y.o. [name] Primary Care Provider: Haynes Hoehn Other Clinician: Referring Provider: Treating Provider/Extender:[name], [name], [name]: 6 Active Problems ICD-10 Evaluated Encounter Code Description Active Date Today Diagnosis I87.333 Chronic venous hypertension (idiopathic) with ulcer 12/12/2018 No Yes and inflammation of bilateral lower extremity L97.818 Non-pressure chronic ulcer of other part of right lower 12/12/2018 No Yes leg with other specified severity L97.518 Non-pressure chronic ulcer of other part of right foot 12/12/2018 No Yes with other specified severity L97.328 Non-pressure chronic ulcer of left ankle with other 12/12/2018 No Yes specified severity Inactive Problems Resolved Problems Electronic Signature(s) Signed: 01/26/2019 5:01:00 PM By: [name] Ham MD Entered By: [name] on 01/26/2019 12:40:04 -------------------------------------------------------------------------------- Progress Note Details Patient Name: Date of Service: [name] 01/26/2019 10:15 AM Medical Record Number:[id number] Patient Account Number: [id number] Date of Birth/Sex: Treating RN: [birth year] (84 y.o. M) Primary Care Provider: Haynes Hoehn Other Clinician: Referring Provider: Treating Provider/Extender:[name], [name], [name]: 6 Subjective History of Present Illness (HPI) ADMISSION 12/12/2018 This is an  83 year old man who is a very complicated patient. He has apparently been followed at the wound care center at [facility] in [place] for a number of years with ulcers that have been described as secondary to chronic venous insufficiency with secondary lymphedema. His wife states that these will come and go she has been to that center multiple times. Most of the recent wounds have apparently been on the left leg. She states that at the end of September she started to see brown spots developing on the right leg which progressed and moved into necrotic areas on multiple areas of the right lower  leg. Also spots on the dorsal feet. He started to develop generalized weakness could not walk. He was admitted for 1 day in early October to [facility] but was discharged and told that he had a UTI. He was then admitted from 11/12/2018 through 11/22/2018. He was felt to have bilateral lower extremity cellulitis on the background of lymphedema and venous stasis ulceration. He was treated with broad-spectrum antibiotics. He was reviewed by Dr. Sharol Given and provided with some form of compression stocking although the patient states that the drainage from his wound stuck to these and cause damage to the skin when these were taken off. He has since been discharged to skilled facility associated with [facility]. The patient's wife is quite descriptive although unfortunately she did not actually take pictures of the wound development. She stated that they had never seen anything like this before. Then there was the deterioration with regards to his mobility. I am not sure that that is gotten any better. Past medical history; hypertension, BPH, coronary artery disease with stents, malignant tumor of the colon, abdominal aortic aneurysm followed with annual ultrasounds but I am not really sure who is following this ABIs in our clinic were 0.74 on the right 0.61 on the left 11/16; patient's appointment with  Dr. Donzetta Matters of vascular surgery is not till 10/23. I did put in a secure text message about this patient. He comes in today with some multiple wound areas on the right leg looking a lot better. Most substantially the wounds are located on the right lateral lower leg. On the left there is the left medial calcaneus. The patient clearly has chronic venous insufficiency with secondary lymphedema however I wondered whether he had macrovascular disease and/or some of the damage on the right leg could be related to a vasculopathy. In any case today things look substantially better than last week. The patient is still at [facility] skilled facility 12/3; since the patient was last here 2-1/2 weeks ago he is been admitted to the hospital for procedure by Dr. Donzetta Matters. At some point he was also found to have a DVT in the right femoral vein. He is on anticoagulation. He underwent aortogram with bilateral lower extremity angiograms on 01/09/2019. This showed the aorta and iliac segments to be tortuous but no flow-limiting stenosis. Bilateral he has SFA nonlimiting stenosis although heavily calcified. He has took two-vessel runoff bilaterally which are quite large vessels. From the tone of this note I really did not think that there was felt to be any macrovascular stenosis. This leaves the initial appearance of his legs with multiple right greater than left lower extremity punched out wounds somewhat difficult to explain in my mind. I do not think this had anything to do with venous disease either reflux or clots In the meantime his legs are actually doing quite better. We have been using silver alginate Curlex and Coban. He was discharged from [facility] and is now at home. He is actually doing quite well 12/17 the patient has a small remaining area on the left medial ankle. 3 areas on the right lateral calf that still requiring debridement. We have been using silver  alginate. Objective Constitutional Sitting or standing Blood Pressure is within target range for patient.. Pulse regular and within target range for patient.Marland Kitchen Respirations regular, non-labored and within target range.. Temperature is normal and within the target range for the patient.Marland Kitchen Appears in no distress. Vitals Time Taken: 10:35 AM, Height: 76 in, Weight: 227 lbs, BMI:  27.6, Temperature: 97.8 F, Pulse: 65 bpm, Respiratory Rate: 19 breaths/min, Blood Pressure: 133/60 mmHg. General Notes: Wound exam; the patient leg continues to improve. He has changes of chronic venous insufficiency with lymphedema. The area on the left medial ankle looks as though it is closing. He has 3 areas on the right lateral calf to have necrotic surfaces. Very difficult to debride. Hemostasis with direct pressure. He has tightly adherent fibrinous debris over these wound Integumentary (Hair, Skin) Wound #2 status is Open. Original cause of wound was Gradually Appeared. The wound is located on the Right Lower Leg. The wound measures 6.5cm length x 5cm width x 0.1cm depth; 25.525cm^2 area and 2.553cm^3 volume. There is Fat Layer (Subcutaneous Tissue) Exposed exposed. There is no tunneling or undermining noted. There is a small amount of serosanguineous drainage noted. The wound margin is distinct with the outline attached to the wound base. There is medium (34-66%) red, hyper - granulation within the wound bed. There is a medium (34-66%) amount of necrotic tissue within the wound bed including Eschar and Adherent Slough. Wound #4 status is Open. Original cause of wound was Gradually Appeared. The wound is located on the Left,Medial Malleolus. The wound measures 0.2cm length x 0.4cm width x 0.1cm depth; 0.063cm^2 area and 0.006cm^3 volume. There is Fat Layer (Subcutaneous Tissue) Exposed exposed. There is no tunneling or undermining noted. There is a medium amount of serosanguineous drainage noted. The wound margin  is distinct with the outline attached to the wound base. There is medium (34-66%) red, pink granulation within the wound bed. There is no necrotic tissue within the wound bed. Assessment Active Problems ICD-10 Chronic venous hypertension (idiopathic) with ulcer and inflammation of bilateral lower extremity Non-pressure chronic ulcer of other part of right lower leg with other specified severity Non-pressure chronic ulcer of other part of right foot with other specified severity Non-pressure chronic ulcer of left ankle with other specified severity Procedures Wound #2 Pre-procedure diagnosis of Wound #2 is a Venous Leg Ulcer located on the Right Lower Leg .Severity of Tissue Pre Debridement is: Fat layer exposed. There was a Excisional Skin/Subcutaneous Tissue Debridement with a total area of 20 sq cm performed by Ricard Dillon., MD. With the following instrument(s): Curette to remove Viable and Non-Viable tissue/material. Material removed includes Subcutaneous Tissue, Slough, Skin: Dermis, and Fibrin/Exudate after achieving pain control using Lidocaine 4% Topical Solution. A time out was conducted at 11:30, prior to the start of the procedure. A Moderate amount of bleeding was controlled with Pressure. The procedure was tolerated well with a pain level of 0 throughout and a pain level of 3 following the procedure. Post Debridement Measurements: 6.5cm length x 5cm width x 0.1cm depth; 2.553cm^3 volume. Character of Wound/Ulcer Post Debridement is improved. Severity of Tissue Post Debridement is: Fat layer exposed. Post procedure Diagnosis Wound #2: Same as Pre-Procedure Plan Follow-up Appointments: Return Appointment in 2 weeks. Dressing Change Frequency: Wound #2 Right Lower Leg: Other: - change twice a week. wound center every two weeks. Wound #4 Left,Medial Malleolus: Other: - change twice a week. wound center every two weeks. Skin Barriers/Peri-Wound Care: Wound #4 Left,Medial  Malleolus: TCA Cream or Ointment - both legs - mixed with lotion in clinic. Patient to use the TCA/cetaphil lotion mix at home with dressing changes. Wound Cleansing: Wound #2 Right Lower Leg: May shower and wash wound with soap and water. - on days that dressing is changed Wound #4 Left,Medial Malleolus: May shower and wash wound with soap  and water. - on days that dressing is changed Primary Wound Dressing: Wound #2 Right Lower Leg: Calcium Alginate with Silver Wound #4 Left,Medial Malleolus: Calcium Alginate with Silver Secondary Dressing: Wound #2 Right Lower Leg: Dry Gauze ABD pad Wound #4 Left,Medial Malleolus: Dry Gauze ABD pad Edema Control: Kerlix and Coban - Bilateral Avoid standing for long periods of time Elevate legs to the level of the heart or above for 30 minutes daily and/or when sitting, a frequency of: - throughout the day Off-Loading: Turn and reposition every 2 hours Home Health: [facility] skilled nursing for wound care. - [facility] home health change twice a week. wound center every two weeks. 1. Still using silver alginate as home health just gave him a supply of this. Ordinarily I might of like to change to something with better debridement properties for the wounds on the right lateral calf. 2. The area on the left medial ankle is just about closed Electronic Signature(s) Signed: 01/26/2019 5:01:00 PM By: [name] Ham MD Entered By: [name] on 01/26/2019 12:48:52 -------------------------------------------------------------------------------- SuperBill Details Patient Name: Date of Service: [name] 01/26/2019 Medical Record Number:[id number] Patient Account Number: [id number] Date of Birth/Sex: Treating RN: [birth year] (83 y.o. [name] Primary Care Provider: Haynes Hoehn Other Clinician: Referring Provider: Treating Provider/Extender:[name], [name], [name]: 6 Diagnosis Coding ICD-10  Codes Code Description I87.333 Chronic venous hypertension (idiopathic) with ulcer and inflammation of bilateral lower extremity L97.818 Non-pressure chronic ulcer of other part of right lower leg with other specified severity L97.518 Non-pressure chronic ulcer of other part of right foot with other specified severity L97.328 Non-pressure chronic ulcer of left ankle with other specified severity Facility Procedures The patient participates with Medicare or their insurance follows the Medicare Facility Guidelines: CPT4 Code Description Modifier Quantity [id number] - DEB SUBQ TISSUE 20 SQ CM/< 1 ICD-10 Diagnosis Description L97.818 Non-pressure chronic ulcer of  other part of right lower leg with other specified severity Physician Procedures CPT4 Code Description: [id number] - WC PHYS SUBQ TISS 20 SQ CM ICD-10 Diagnosis Description L97.818 Non-pressure chronic ulcer of other part of right lower leg severity Modifier: with other spec Quantity: 1 ified Electronic Signature(s) Signed: 01/26/2019 5:01:00 PM By: [name] Ham MD Entered By: [name] on 01/26/2019 12:50:25

## 2019-01-27 ENCOUNTER — Encounter: Payer: Self-pay | Admitting: Pulmonary Disease

## 2019-01-27 ENCOUNTER — Ambulatory Visit (INDEPENDENT_AMBULATORY_CARE_PROVIDER_SITE_OTHER): Payer: Medicare Other | Admitting: Pulmonary Disease

## 2019-01-27 VITALS — BP 130/78 | HR 62 | Temp 97.0°F | Ht 76.0 in | Wt 243.0 lb

## 2019-01-27 DIAGNOSIS — R0600 Dyspnea, unspecified: Secondary | ICD-10-CM

## 2019-01-27 DIAGNOSIS — R911 Solitary pulmonary nodule: Secondary | ICD-10-CM

## 2019-01-27 NOTE — Patient Instructions (Signed)
We will do a walk test to assess your oxygen levels and see if you qualify for portable concentrator Schedule pulmonary function test Schedule high-res CT in 6 months time Follow-up in 4 weeks

## 2019-01-27 NOTE — Addendum Note (Signed)
Addended by: Hildred Alamin I on: 01/27/2019 12:16 PM   Modules accepted: Orders

## 2019-01-27 NOTE — Addendum Note (Signed)
Addended by: Hildred Alamin I on: 01/27/2019 03:08 PM   Modules accepted: Orders

## 2019-01-27 NOTE — Progress Notes (Signed)
[name]    [id number]    1935-12-03  Primary Care Physician:Weeks, Georgeanna Lea, NP  Referring Physician: Aline August, MD [address],  [place] [address]  Chief complaint: Consult for lung nodule  HPI: 83 year old with significant smoking history, asbestos exposure, coronary artery disease, colon cancer, peripheral vascular disease, DVT  Hospitalized in October 2020 for lower extremity cellulitis.  CT scan at that time showed evidence of lower extremity nodular consolidation and a right upper lobe groundglass nodule suggestive of pneumonia.   He was treated with antibiotics for both conditions and referred to pulmonary for further evaluation.  Also noted to have left lower extremity DVT for which he is on anticoagulation.  Continues on supplemental oxygen and Dulera which was started during this admission Has chronic cough with white mucus, denies any fevers, chills  Pets: 3 dogs, cat.  No birds, farm animals Occupation: Retired Insurance account manager Exposures: Significant exposure to asbestos.  No mold, hot tub, Jacuzzi or down pillows or comforters Smoking history: 120-pack-year smoker.  Quit in 2005 Travel history: Originally lived in [place] and [place].  No significant recent travel Relevant family history: He was adopted and does not know of any family history  Outpatient Encounter Medications as of 01/27/2019  Medication Sig  . albuterol (VENTOLIN HFA) 108 (90 Base) MCG/ACT inhaler Inhale 2 puffs into the lungs every 6 (six) hours as needed for wheezing or shortness of breath.  Marland Kitchen amLODipine-benazepril (LOTREL) 10-40 MG per capsule Take 1 capsule by mouth daily. (Patient taking differently: Take 1 capsule by mouth at bedtime. )  . aspirin 81 MG chewable tablet Chew 81 mg by mouth every evening.  Marland Kitchen atorvastatin (LIPITOR) 20 MG tablet Take 1 tablet (20 mg total) by mouth every evening.  . Ferrous Gluconate (IRON) 240 (27 FE) MG TABS Take 240 mg by  mouth daily.   . Lycopene 10 MG CAPS Take 10 mg by mouth daily.  . metoprolol tartrate (LOPRESSOR) 100 MG tablet Take 1 tablet (100 mg total) by mouth at bedtime.  . mometasone-formoterol (DULERA) 100-5 MCG/ACT AERO Inhale 2 puffs into the lungs 2 (two) times daily.  . nitroGLYCERIN (NITROSTAT) 0.4 MG SL tablet Place 1 tablet (0.4 mg total) under the tongue every 5 (five) minutes as needed. (Patient taking differently: Place 0.4 mg under the tongue every 5 (five) minutes as needed for chest pain. )  . Vitamin D, Cholecalciferol, 1000 UNITS TABS Take 1,000 Units by mouth 2 (two) times daily.  . [DISCONTINUED] apixaban (ELIQUIS) 5 MG TABS tablet Take 2 tablets (10mg ) twice daily for 7 days, then 1 tablet (5mg ) twice daily  . [DISCONTINUED] hydrochlorothiazide (HYDRODIURIL) 25 MG tablet Take 25 mg by mouth daily.   No facility-administered encounter medications on file as of 01/27/2019.    Allergies as of 01/27/2019 - Review Complete 01/27/2019  Allergen Reaction Noted  . Sulfa antibiotics Other (See Comments) 05/23/2014    Past Medical History:  Diagnosis Date  . Arthritis   . CAD (coronary artery disease)    Abnormal stress perfusion study with apical and inferior wall ischemia. LAD had a long 99% stenosis followed by 30% stenosis, first diagonal 25% stenosis, circumflex luminal irregularities, second obtuse marginal 25% stenosis, RCA 40% followed by 40% stenosis. EF 60%. Stenting with 2 bare metal stents by Dr. Burt Knack to the LAD.  Marland Kitchen Colon cancer [facility])    Radiation, chemo, surgery 2011  . Complication of anesthesia  Pt states he coughs alot during anesthesia  . COPD (chronic obstructive pulmonary disease) ([place])   . History of tobacco use   . Hypertension     Past Surgical History:  Procedure Laterality Date  . ABDOMINAL AORTOGRAM W/LOWER EXTREMITY N/A 01/09/2019   Procedure: ABDOMINAL AORTOGRAM W/LOWER EXTREMITY;  Surgeon: Waynetta Sandy, MD;  Location: [facility];  Service: Cardiovascular;  Laterality: N/A;  . CATARACT EXTRACTION W/PHACO Left 05/29/2016   Procedure: CATARACT EXTRACTION PHACO AND INTRAOCULAR LENS PLACEMENT (IOC);  Surgeon: Baruch Goldmann, MD;  Location: [facility];  Service: Ophthalmology;  Laterality: Left;  CDE: 10.39  . CATARACT EXTRACTION W/PHACO Right 06/12/2016   Procedure: CATARACT EXTRACTION PHACO AND INTRAOCULAR LENS PLACEMENT RIGHT EYE CDE= 13.59;  Surgeon: Baruch Goldmann, MD;  Location: [facility];  Service: Ophthalmology;  Laterality: Right;  right  . CORONARY ANGIOPLASTY WITH STENT PLACEMENT  2008   x 2  . HEMICOLECTOMY  01/10/2010  . INGUINAL HERNIA REPAIR  2012  . PORTACATH PLACEMENT  2011  . portacath removal  2011  . UMBILICAL HERNIA REPAIR  01/2008   left    Family History  Adopted: Yes    Social History   Socioeconomic History  . Marital status: Married    Spouse name: Not on file  . Number of children: Not on file  . Years of education: Not on file  . Highest education level: Not on file  Occupational History  . Occupation: Retired  Tobacco Use  . Smoking status: Former Smoker    Packs/day: 0.50    Years: 57.00    Pack years: 28.50    Quit date: 2006    Years since quitting: 14.9  . Smokeless tobacco: Never Used  . Tobacco comment: Used to be a 3 pack per day smoker  Substance and Sexual Activity  . Alcohol use: No  . Drug use: No  . Sexual activity: Yes  Other Topics Concern  . Not on file  Social History Narrative   Married with 3 children and 2 grandchildren   Social Determinants of Health   Financial Resource Strain:   . Difficulty of Paying Living Expenses: Not on file  Food Insecurity:   . Worried About Charity fundraiser in the Last Year: Not on file  . Ran Out of Food in the Last Year: Not on file  Transportation Needs:   . Lack of Transportation (Medical): Not on file  . Lack of Transportation (Non-Medical): Not on file  Physical Activity:   . Days of Exercise per Week: Not on file   . Minutes of Exercise per Session: Not on file  Stress:   . Feeling of Stress : Not on file  Social Connections:   . Frequency of Communication with Friends and Family: Not on file  . Frequency of Social Gatherings with Friends and Family: Not on file  . Attends Religious Services: Not on file  . Active Member of Clubs or Organizations: Not on file  . Attends Archivist Meetings: Not on file  . Marital Status: Not on file  Intimate Partner Violence:   . Fear of Current or Ex-Partner: Not on file  . Emotionally Abused: Not on file  . Physically Abused: Not on file  . Sexually Abused: Not on file    Review of systems: Review of Systems  Constitutional: Negative for fever and chills.  HENT: Negative.   Eyes: Negative for blurred vision.  Respiratory: as per HPI  Cardiovascular: Negative  for chest pain and palpitations.  Gastrointestinal: Negative for vomiting, diarrhea, blood per rectum. Genitourinary: Negative for dysuria, urgency, frequency and hematuria.  Musculoskeletal: Negative for myalgias, back pain and joint pain.  Skin: Negative for itching and rash.  Neurological: Negative for dizziness, tremors, focal weakness, seizures and loss of consciousness.  Endo/Heme/Allergies: Negative for environmental allergies.  Psychiatric/Behavioral: Negative for depression, suicidal ideas and hallucinations.  All other systems reviewed and are negative.  Physical Exam: Blood pressure 130/78, pulse 62, temperature (!) 97 F (36.1 C), temperature source Temporal, height 6\' 4"  (1.93 m), weight 243 lb (110.2 kg), SpO2 97 %. Gen:      No acute distress HEENT:  EOMI, sclera anicteric Neck:     No masses; no thyromegaly Lungs:    Clear to auscultation bilaterally; normal respiratory effort CV:         Regular rate and rhythm; no murmurs Abd:      + bowel sounds; soft, non-tender; no palpable masses, no distension Ext:    No edema; adequate peripheral perfusion Skin:      Warm and  dry; no rash Neuro: alert and oriented x 3 Psych: normal mood and affect  Data Reviewed: Imaging: CT abdomen pelvis 04/09/2017-visualized lung bases do not show any abnormality  CT chest 11/19/2018-no PE, patchy airspace disease in the right lower lobe.  1.6 cm right upper lobe subsolid nodule, coronary, aortic atherosclerosis I have reviewed the images personally.  Labs: CBC 11/14/2018-WBC 11.7, eos 7%, absolute eosinophil count 819  Assessment:  Lung nodule CT reviewed with right upper lobe subsolid nodule.  This is in the context of a right lower lobe consolidation and I suspect it is inflammatory, infectious in nature He has already been treated with antibiotics.  Follow-up CT in 6 months to reevaluate  Suspected COPD Currently on Dulera.  We will continue for now as he has elevated peripheral eosinophils in the past Schedule PFTs  Asbestos exposure Prior CT abdomen in 2019 does not show any fibrosis in the lung base.  On his latest CT chest lung bases are obscured by consolidation Review the follow-up CT to reevaluate  Plan/Recommendations: - PFTs - CT chest in 6 months - Continue Lenoard Aden MD [facility] 01/27/2019, 11:04 AM  CC: Aline August, MD

## 2019-01-30 ENCOUNTER — Telehealth: Payer: Self-pay | Admitting: Pulmonary Disease

## 2019-01-30 MED ORDER — DULERA 100-5 MCG/ACT IN AERO: 2.0000 | INHALATION_SPRAY | Freq: Two times a day (BID) | RESPIRATORY_TRACT | 5 refills | Status: DC

## 2019-01-30 NOTE — Telephone Encounter (Signed)
Called and spoke with pt's wife. Looked at pt's OV with Dr. Vaughan Browner and saw where he did say to continue the [facility]. Stated that I would send Rx to pharmacy and she verbalized understanding. Verified pt's preferred pharmacy and sent Rx in. Nothing further needed.

## 2019-02-09 ENCOUNTER — Other Ambulatory Visit: Payer: Self-pay

## 2019-02-09 ENCOUNTER — Encounter (HOSPITAL_BASED_OUTPATIENT_CLINIC_OR_DEPARTMENT_OTHER): Payer: Medicare Other | Admitting: Internal Medicine

## 2019-02-09 DIAGNOSIS — L97818 Non-pressure chronic ulcer of other part of right lower leg with other specified severity: Secondary | ICD-10-CM | POA: Diagnosis not present

## 2019-02-09 NOTE — Progress Notes (Signed)
[name] (ZN:[id number]) Visit Report for 02/09/2019 HPI Details Patient Name: Date of Service: [name], [name] 02/09/2019 10:00 AM Medical Record Number:[id number] Patient Account Number: [id number] Date of Birth/Sex: Treating RN: [birth year] (83 y.o. M) Primary Care Provider: Haynes Hoehn Other Clinician: Referring Provider: Treating Provider/Extender:Keyundra Fant, [name], [name]: 8 History of Present Illness HPI Description: ADMISSION 12/12/2018 This is an 83 year old man who is a very complicated patient. He has apparently been followed at the wound care center at [facility] in [place] for a number of years with ulcers that have been described as secondary to chronic venous insufficiency with secondary lymphedema. His wife states that these will come and go she has been to that center multiple times. Most of the recent wounds have apparently been on the left leg. She states that at the end of September she started to see brown spots developing on the right leg which progressed and moved into necrotic areas on multiple areas of the right lower leg. Also spots on the dorsal feet. He started to develop generalized weakness could not walk. He was admitted for 1 day in early October to [facility] but was discharged and told that he had a UTI. He was then admitted from 11/12/2018 through 11/22/2018. He was felt to have bilateral lower extremity cellulitis on the background of lymphedema and venous stasis ulceration. He was treated with broad-spectrum antibiotics. He was reviewed by Dr. Sharol Given and provided with some form of compression stocking although the patient states that the drainage from his wound stuck to these and cause damage to the skin when these were taken off. He has since been discharged to skilled facility associated with [facility] The patient's wife is quite descriptive although unfortunately she did not actually take pictures of the  wound development. She stated that they had never seen anything like this before. Then there was the deterioration with regards to his mobility. I am not sure that that is gotten any better. Past medical history; hypertension, BPH, coronary artery disease with stents, malignant tumor of the colon, abdominal aortic aneurysm followed with annual ultrasounds but I am not really sure who is following this ABIs in our clinic were 0.74 on the right 0.61 on the left 11/16; patient's appointment with Dr. Donzetta Matters of vascular surgery is not till 10/23. I did put in a secure text message about this patient. He comes in today with some multiple wound areas on the right leg looking a lot better. Most substantially the wounds are located on the right lateral lower leg. On the left there is the left medial calcaneus. The patient clearly has chronic venous insufficiency with secondary lymphedema however I wondered whether he had macrovascular disease and/or some of the damage on the right leg could be related to a vasculopathy. In any case today things look substantially better than last week. The patient is still at [facility] skilled facility 12/3; since the patient was last here 2-1/2 weeks ago he is been admitted to the hospital for procedure by Dr. Donzetta Matters. At some point he was also found to have a DVT in the right femoral vein. He is on anticoagulation. He underwent aortogram with bilateral lower extremity angiograms on 01/09/2019. This showed the aorta and iliac segments to be tortuous but no flow-limiting stenosis. Bilateral he has SFA nonlimiting stenosis although heavily calcified. He has took two-vessel runoff bilaterally which are quite large vessels. From the tone of this note I really did not think that there was  felt to be any macrovascular stenosis. This leaves the initial appearance of his legs with multiple right greater than left lower extremity punched out wounds somewhat difficult to explain in my  mind. I do not think this had anything to do with venous disease either reflux or clots In the meantime his legs are actually doing quite better. We have been using silver alginate Curlex and Coban. He was discharged from [place] and is now at home. He is actually doing quite well 12/17 the patient has a small remaining area on the left medial ankle. 3 areas on the right lateral calf that still requiring debridement. We have been using silver alginate. 12/31; the patient has a small area on the left medial ankle that is still open. The areas on the lateral calf are improved now measuring 2 areas. We have been using silver alginate under compression. Electronic Signature(s) Signed: 02/09/2019 2:35:12 PM By: Linton Ham MD Entered By: Linton Ham on 02/09/2019 11:24:40 -------------------------------------------------------------------------------- Physical Exam Details Patient Name: Date of Service: [name], [name] 02/09/2019 10:00 AM Medical Record Number:[id number] Patient Account Number: [id number] Date of Birth/Sex: Treating RN: 02/04/1936 (83 y.o. M) Primary Care Provider: Haynes Hoehn Other Clinician: Referring Provider: Treating Provider/Extender:Tayden Nichelson, [name], [name]: 8 Constitutional Sitting or standing Blood Pressure is within target range for patient.. Pulse regular and within target range for patient.Marland Kitchen Respirations regular, non-labored and within target range.. Temperature is normal and within the target range for the patient.Marland Kitchen Appears in no distress. Eyes Conjunctivae clear. No discharge.no icterus. Respiratory work of breathing is normal. Cardiovascular Edema present in both extremities. This is pitting. Notes Wound exam; arrives in clinic today with a fair amount of pitting edema especially in his right leg. His wife states that this may be because she was wrapping the compression. Electronic Signature(s) Signed:  02/09/2019 2:35:12 PM By: Linton Ham MD Entered By: Linton Ham on 02/09/2019 11:28:39 -------------------------------------------------------------------------------- Physician Orders Details Patient Name: Date of Service: [name] 02/09/2019 10:00 AM Medical Record Number:[id number] Patient Account Number: [id number] Date of Birth/Sex: Treating RN: 29-Jan-1936 (83 y.o. [name] Primary Care Provider: Haynes Hoehn Other Clinician: Referring Provider: Treating Provider/Extender:Yvonne Stopher, [name], [name]: 8 Verbal / Phone Orders: No Diagnosis Coding ICD-10 Coding Code Description I87.333 Chronic venous hypertension (idiopathic) with ulcer and inflammation of bilateral lower extremity L97.818 Non-pressure chronic ulcer of other part of right lower leg with other specified severity L97.518 Non-pressure chronic ulcer of other part of right foot with other specified severity L97.328 Non-pressure chronic ulcer of left ankle with other specified severity Follow-up Appointments Return Appointment in 2 weeks. Dressing Change Frequency Wound #2 Right,Lateral Lower Leg Other: - change twice a week. wound center every two weeks. Wound #4 Left,Medial Malleolus Other: - change twice a week. wound center every two weeks. Skin Barriers/Peri-Wound Care Wound #4 Left,Medial Malleolus TCA Cream or Ointment - both legs - mixed with lotion in clinic. Patient to use the TCA/cetaphil lotion mix at home with dressing changes. Wound Cleansing Wound #2 Right,Lateral Lower Leg May shower and wash wound with soap and water. - on days that dressing is changed Wound #4 Left,Medial Malleolus May shower and wash wound with soap and water. - on days that dressing is changed Primary Wound Dressing Wound #2 Right,Lateral Lower Leg Calcium Alginate with Silver Wound #4 Left,Medial Malleolus Calcium Alginate with Silver Wound #5 Right,Anterior Lower Leg Calcium  Alginate with Silver Secondary Dressing Wound #2 Right,Lateral Lower Leg Dry  Gauze ABD pad Wound #4 Left,Medial Malleolus Dry Gauze Edema Control 3 Layer Compression System - Bilateral Avoid standing for long periods of time Elevate legs to the level of the heart or above for 30 minutes daily and/or when sitting, a frequency of: - throughout the day Support Garment 30-40 mm/Hg pressure to: - order patient [place] 4000 for both legs. Off-Loading Turn and reposition every 2 hours [facility] skilled nursing for wound care. - [facility] change twice a week. wound center every two weeks. Electronic Signature(s) Signed: 02/09/2019 2:35:12 PM By: Linton Ham MD Signed: 02/09/2019 2:55:42 PM By: Deon Pilling Entered By: Deon Pilling on 02/09/2019 11:05:13 -------------------------------------------------------------------------------- Problem List Details Patient Name: Date of Service: [name] 02/09/2019 10:00 AM Medical Record Number:[id number] Patient Account Number: [id number] Date of Birth/Sex: Treating RN: [birth year] (83 y.o. [name] Primary Care Provider: Haynes Hoehn Other Clinician: Referring Provider: Treating Provider/Extender:Zahria Ding, [name], [name]: 8 Active Problems ICD-10 Evaluated Encounter Code Description Active Date Today Diagnosis I87.333 Chronic venous hypertension (idiopathic) with ulcer 12/12/2018 No Yes and inflammation of bilateral lower extremity L97.818 Non-pressure chronic ulcer of other part of right lower 12/12/2018 No Yes leg with other specified severity L97.518 Non-pressure chronic ulcer of other part of right foot 12/12/2018 No Yes with other specified severity L97.328 Non-pressure chronic ulcer of left ankle with other 12/12/2018 No Yes specified severity Inactive Problems Resolved Problems Electronic Signature(s) Signed: 02/09/2019 2:35:12 PM By: Linton Ham MD Entered By: Linton Ham on 02/09/2019 11:22:58 -------------------------------------------------------------------------------- Progress Note Details Patient Name: Date of Service: [name] 02/09/2019 10:00 AM Medical Record Number:[id number] Patient Account Number: [id number] Date of Birth/Sex: Treating RN: Jun 19, 1935 (83 y.o. M) Primary Care Provider: Haynes Hoehn Other Clinician: Referring Provider: Treating Provider/Extender:Shela Esses, [name], [name]: 8 Subjective History of Present Illness (HPI) ADMISSION 12/12/2018 This is an 83 year old man who is a very complicated patient. He has apparently been followed at the wound care center at [facility] in [place] for a number of years with ulcers that have been described as secondary to chronic venous insufficiency with secondary lymphedema. His wife states that these will come and go she has been to that center multiple times. Most of the recent wounds have apparently been on the left leg. She states that at the end of September she started to see brown spots developing on the right leg which progressed and moved into necrotic areas on multiple areas of the right lower leg. Also spots on the dorsal feet. He started to develop generalized weakness could not walk. He was admitted for 1 day in early October to [facility] but was discharged and told that he had a UTI. He was then admitted from 11/12/2018 through 11/22/2018. He was felt to have bilateral lower extremity cellulitis on the background of lymphedema and venous stasis ulceration. He was treated with broad-spectrum antibiotics. He was reviewed by Dr. Sharol Given and provided with some form of compression stocking although the patient states that the drainage from his wound stuck to these and cause damage to the skin when these were taken off. He has since been discharged to skilled facility associated with [facility]. The  patient's wife is quite descriptive although unfortunately she did not actually take pictures of the wound development. She stated that they had never seen anything like this before. Then there was the deterioration with regards to his mobility. I am not sure that that is gotten  any better. Past medical history; hypertension, BPH, coronary artery disease with stents, malignant tumor of the colon, abdominal aortic aneurysm followed with annual ultrasounds but I am not really sure who is following this ABIs in our clinic were 0.74 on the right 0.61 on the left 11/16; patient's appointment with Dr. Donzetta Matters of vascular surgery is not till 10/23. I did put in a secure text message about this patient. He comes in today with some multiple wound areas on the right leg looking a lot better. Most substantially the wounds are located on the right lateral lower leg. On the left there is the left medial calcaneus. The patient clearly has chronic venous insufficiency with secondary lymphedema however I wondered whether he had macrovascular disease and/or some of the damage on the right leg could be related to a vasculopathy. In any case today things look substantially better than last week. The patient is still at [facility] skilled facility 12/3; since the patient was last here 2-1/2 weeks ago he is been admitted to the hospital for procedure by Dr. Donzetta Matters. At some point he was also found to have a DVT in the right femoral vein. He is on anticoagulation. He underwent aortogram with bilateral lower extremity angiograms on 01/09/2019. This showed the aorta and iliac segments to be tortuous but no flow-limiting stenosis. Bilateral he has SFA nonlimiting stenosis although heavily calcified. He has took two-vessel runoff bilaterally which are quite large vessels. From the tone of this note I really did not think that there was felt to be any macrovascular stenosis. This leaves the initial appearance of his legs with  multiple right greater than left lower extremity punched out wounds somewhat difficult to explain in my mind. I do not think this had anything to do with venous disease either reflux or clots In the meantime his legs are actually doing quite better. We have been using silver alginate Curlex and Coban. He was discharged from [place] [facility] and is now at home. He is actually doing quite well 12/17 the patient has a small remaining area on the left medial ankle. 3 areas on the right lateral calf that still requiring debridement. We have been using silver alginate. 12/31; the patient has a small area on the left medial ankle that is still open. The areas on the lateral calf are improved now measuring 2 areas. We have been using silver alginate under compression. Objective Constitutional Sitting or standing Blood Pressure is within target range for patient.. Pulse regular and within target range for patient.Marland Kitchen Respirations regular, non-labored and within target range.. Temperature is normal and within the target range for the patient.Marland Kitchen Appears in no distress. Vitals Time Taken: 10:13 AM, Height: 76 in, Weight: 227 lbs, BMI: 27.6, Temperature: 97.8 F, Pulse: 81 bpm, Respiratory Rate: 19 breaths/min, Blood Pressure: 136/73 mmHg. Eyes Conjunctivae clear. No discharge.no icterus. Respiratory work of breathing is normal. Cardiovascular Edema present in both extremities. This is pitting. General Notes: Wound exam; arrives in clinic today with a fair amount of pitting edema especially in his right leg. His wife states that this may be because she was wrapping the compression. Integumentary (Hair, Skin) Wound #2 status is Open. Original cause of wound was Gradually Appeared. The wound is located on the Right,Lateral Lower Leg. The wound measures 6.4cm length x 1cm width x 0.1cm depth; 5.027cm^2 area and 0.503cm^3 volume. There is Fat Layer (Subcutaneous Tissue) Exposed exposed. There is no  tunneling or undermining noted. There is a small amount of serosanguineous  drainage noted. The wound margin is distinct with the outline attached to the wound base. There is medium (34-66%) red, hyper - granulation within the wound bed. There is a medium (34-66%) amount of necrotic tissue within the wound bed including Adherent Slough. Wound #4 status is Open. Original cause of wound was Gradually Appeared. The wound is located on the Left,Medial Malleolus. The wound measures 0.5cm length x 0.9cm width x 0.1cm depth; 0.353cm^2 area and 0.035cm^3 volume. There is Fat Layer (Subcutaneous Tissue) Exposed exposed. There is no tunneling or undermining noted. There is a medium amount of serosanguineous drainage noted. The wound margin is distinct with the outline attached to the wound base. There is medium (34-66%) red, pink granulation within the wound bed. There is no necrotic tissue within the wound bed. Wound #5 status is Open. Original cause of wound was Gradually Appeared. The wound is located on the Right,Anterior Lower Leg. The wound measures 0.4cm length x 0.6cm width x 0.1cm depth; 0.188cm^2 area and 0.019cm^3 volume. There is Fat Layer (Subcutaneous Tissue) Exposed exposed. There is no tunneling or undermining noted. There is a small amount of serosanguineous drainage noted. The wound margin is distinct with the outline attached to the wound base. There is medium (34-66%) pink granulation within the wound bed. There is a medium (34-66%) amount of necrotic tissue within the wound bed including Adherent Slough. Assessment Active Problems ICD-10 Chronic venous hypertension (idiopathic) with ulcer and inflammation of bilateral lower extremity Non-pressure chronic ulcer of other part of right lower leg with other specified severity Non-pressure chronic ulcer of other part of right foot with other specified severity Non-pressure chronic ulcer of left ankle with other specified  severity Procedures Wound #2 Pre-procedure diagnosis of Wound #2 is a Venous Leg Ulcer located on the Right,Lateral Lower Leg . There was a Three Layer Compression Therapy Procedure by Baruch [name], RN. Post procedure Diagnosis Wound #2: Same as Pre-Procedure Wound #4 Pre-procedure diagnosis of Wound #4 is a Venous Leg Ulcer located on the Left,Medial Malleolus . There was a Three Layer Compression Therapy Procedure by Baruch [name], RN. Post procedure Diagnosis Wound #4: Same as Pre-Procedure Wound #5 Pre-procedure diagnosis of Wound #5 is a Venous Leg Ulcer located on the Right,Anterior Lower Leg . There was a Three Layer Compression Therapy Procedure by Baruch [name], RN. Post procedure Diagnosis Wound #5: Same as Pre-Procedure Plan Follow-up Appointments: Return Appointment in 2 weeks. Dressing Change Frequency: Wound #2 Right,Lateral Lower Leg: Other: - change twice a week. wound center every two weeks. Wound #4 Left,Medial Malleolus: Other: - change twice a week. wound center every two weeks. Skin Barriers/Peri-Wound Care: Wound #4 Left,Medial Malleolus: TCA Cream or Ointment - both legs - mixed with lotion in clinic. Patient to use the TCA/cetaphil lotion mix at home with dressing changes. Wound Cleansing: Wound #2 Right,Lateral Lower Leg: May shower and wash wound with soap and water. - on days that dressing is changed Wound #4 Left,Medial Malleolus: May shower and wash wound with soap and water. - on days that dressing is changed Primary Wound Dressing: Wound #2 Right,Lateral Lower Leg: Calcium Alginate with Silver Wound #4 Left,Medial Malleolus: Calcium Alginate with Silver Wound #5 Right,Anterior Lower Leg: Calcium Alginate with Silver Secondary Dressing: Wound #2 Right,Lateral Lower Leg: Dry Gauze ABD pad Wound #4 Left,Medial Malleolus: Dry Gauze Edema Control: 3 Layer Compression System - Bilateral Avoid standing for long periods of time Elevate legs  to the level of the heart or above for 30 minutes daily  and/or when sitting, a frequency of: - throughout the day Support Garment 30-40 mm/Hg pressure to: - order patient [place] 4000 for both legs. Off-Loading: Turn and reposition every 2 hours Home Health: [place] skilled nursing for wound care. - [facility] home health change twice a week. wound center every two weeks. 1. Continue with silver alginate as the primary dressing 2. 3 layer compression bilaterally which is an increase I think from [employer]) Signed: 02/09/2019 2:35:12 PM By: Linton Ham MD Entered By: Linton Ham on 02/09/2019 11:29:28 -------------------------------------------------------------------------------- SuperBill Details Patient Name: Date of Service: [name] 02/09/2019 Medical Record Number:[id number] Patient Account Number: [id number] Date of Birth/Sex: Treating RN: March 30, 1935 (83 y.o. [name] Primary Care Provider: Haynes Hoehn Other Clinician: Referring Provider: Treating Provider/Extender:Chesney Suares, [name], [name]: 8 Diagnosis Coding ICD-10 Codes Code Description I87.333 Chronic venous hypertension (idiopathic) with ulcer and inflammation of bilateral lower extremity L97.818 Non-pressure chronic ulcer of other part of right lower leg with other specified severity L97.518 Non-pressure chronic ulcer of other part of right foot with other specified severity L97.328 Non-pressure chronic ulcer of left ankle with other specified severity Facility Procedures The patient participates with Medicare or their insurance follows the Medicare Facility Guidelines: CPT4 Description Modifier Quantity Code [id number] BILATERAL: Application of multi-layer venous compression system; leg 1 (below knee), including ankle and foot. Physician Procedures CPT4: Code [id number] Description: 213 - WC PHYS LEVEL 3 - EST PT ICD-10  Diagnosis Description L97.818 Non-pressure chronic ulcer of other part of right lower l severity I87.333 Chronic venous hypertension (idiopathic) with ulcer and i lower extremity L97.328 Non-pressure  chronic ulcer of left ankle with other speci Modifier: eg with other s nflammation of fied severity Quantity: 1 pecified bilateral Electronic Signature(s) Signed: 02/09/2019 2:35:12 PM By: Linton Ham MD Entered By: Linton Ham on 02/09/2019 11:30:00

## 2019-02-13 NOTE — Progress Notes (Addendum)
BHAVESH, [name] ([id number]) Visit Report for 02/09/2019 Arrival Information Details Patient Name: Date of Service: [name], [name] 02/09/2019 10:00 AM Medical Record Number:[id number] Patient Account Number: [id number] Date of Birth/Sex: Treating RN: 05/05/1935 (84 y.o. [name] Primary Care Tiasia Weberg: Haynes Hoehn Other Clinician: Referring Iria Jamerson: Treating Stefan Karen/Extender:[name], [name], [name]: 8 Visit Information History Since Last Visit Added or deleted any medications: No Patient Arrived: Wheel Chair Any new allergies or adverse reactions: No Arrival Time: 10:12 Had a fall or experienced change in No Accompanied By: family member activities of daily living that may affect Transfer Assistance: EasyPivot risk of falls: Patient Lift Signs or symptoms of abuse/neglect since last No Patient Identification Verified: Yes visito Secondary Verification Process Yes Hospitalized since last visit: No Completed: Implantable device outside of the clinic excluding No Patient Requires Transmission-Based No cellular tissue based products placed in the center Precautions: since last visit: Patient Has Alerts: No Has Dressing in Place as Prescribed: Yes Has Compression in Place as Prescribed: Yes Pain Present Now: No Electronic Signature(s) Signed: 02/13/2019 4:32:10 PM By: Kela Millin Entered By: Kela Millin on 02/09/2019 10:13:24 -------------------------------------------------------------------------------- Compression Therapy Details Patient Name: Date of Service: [name] 02/09/2019 10:00 AM Medical Record Number:[id number] Patient Account Number: [id number] Date of Birth/Sex: Treating RN: Dec 08, 1935 (84 y.o. [name] Primary Care Kutler Vanvranken: Haynes Hoehn Other Clinician: Referring Laberta Wilbon: Treating Kaityln Kallstrom/Extender:[name], [name], [name]: 8 Compression Therapy Performed for Wound  Wound #2 Right,Lateral Lower Leg Assessment: Performed By: Clinician Baruch [name], RN Compression Type: Three Layer Post Procedure Diagnosis Same as Pre-procedure Electronic Signature(s) Signed: 02/09/2019 2:55:42 PM By: Deon Pilling Entered By: Deon Pilling on 02/09/2019 11:05:55 -------------------------------------------------------------------------------- Compression Therapy Details Patient Name: Date of Service: [name], [name] 02/09/2019 10:00 AM Medical Record Number:[id number] Patient Account Number: [id number] Date of Birth/Sex: Treating RN: [birth year] (84 y.o. [name] Primary Care Talaya Lamprecht: Haynes Hoehn Other Clinician: Referring Imanni Burdine: Treating Tanaysha Alkins/Extender:[name], [name], [name]: 8 Compression Therapy Performed for Wound Wound #5 Right,Anterior Lower Leg Assessment: Performed By: Clinician Baruch [name], RN Compression Type: Three Layer Post Procedure Diagnosis Same as Pre-procedure Electronic Signature(s) Signed: 02/09/2019 2:55:42 PM By: Deon Pilling Entered By: Deon Pilling on 02/09/2019 11:05:56 -------------------------------------------------------------------------------- Compression Therapy Details Patient Name: Date of Service: [name], [name] 02/09/2019 10:00 AM Medical Record Number:[id number] Patient Account Number: [id number] Date of Birth/Sex: Treating RN: 1935-04-26 (84 y.o. [name] Primary Care Cherissa Hook: Haynes Hoehn Other Clinician: Referring Corneluis Allston: Treating Bresha Hosack/Extender:[name], [name], [name]: 8 Compression Therapy Performed for Wound Wound #4 Left,Medial Malleolus Assessment: Performed By: Clinician Baruch [name], RN Compression Type: Three Layer Post Procedure Diagnosis Same as Pre-procedure Electronic Signature(s) Signed: 02/09/2019 2:55:42 PM By: Deon Pilling Entered By: Deon Pilling on 02/09/2019  11:05:56 -------------------------------------------------------------------------------- Encounter Discharge Information Details Patient Name: Date of Service: [name]. 02/09/2019 10:00 AM Medical Record Number:[id number] Patient Account Number: [id number] Date of Birth/Sex: Treating RN: 06/20/1935 (84 y.o. [name] Primary Care Jatziri Goffredo: Haynes Hoehn Other Clinician: Referring Laurel Smeltz: Treating Latron Ribas/Extender:[name], [name], [name]: 8 Encounter Discharge Information Items Discharge Condition: Stable Ambulatory Status: Wheelchair Discharge Destination: Home Transportation: Private Auto Accompanied By: family member Schedule Follow-up Appointment: Yes Clinical Summary of Care: Patient Declined Electronic Signature(s) Signed: 02/13/2019 4:32:10 PM By: Kela Millin Entered By: Kela Millin on 02/09/2019 11:30:23 -------------------------------------------------------------------------------- Lower Extremity Assessment Details Patient Name: Date of Service: [name], [name] 02/09/2019 10:00 AM Medical Record Number:[id number] Patient Account Number: [id number]  Date of Birth/Sex: Treating RN: [birth year] (84 y.o. [name] Primary Care Nellie Chevalier: Haynes Hoehn Other Clinician: Referring Kailia Starry: Treating Kinslee Dalpe/Extender:[name], [name], [name]: 8 Edema Assessment Assessed: [Left: No] [Right: No] Edema: [Left: Yes] [Right: Yes] Calf Left: Right: Point of Measurement: 45 cm From Medial Instep 41.5 cm 41 cm Ankle Left: Right: Point of Measurement: 11 cm From Medial Instep 26.2 cm 25.5 cm Vascular Assessment Pulses: Dorsalis Pedis Palpable: [Left:Yes] [Right:Yes] Electronic Signature(s) Signed: 02/13/2019 4:32:10 PM By: Kela Millin Entered By: Kela Millin on 02/09/2019 10:22:42 -------------------------------------------------------------------------------- Multi Wound Chart  Details Patient Name: Date of Service: [name]. 02/09/2019 10:00 AM Medical Record Number:[id number] Patient Account Number: [id number] Date of Birth/Sex: Treating RN: [birth year] (84 y.o. M) Primary Care Yoselyn Mcglade: Haynes Hoehn Other Clinician: Referring Kimyata Milich: Treating Kissa Campoy/Extender:[name], [name], [name]: 8 Vital Signs Height(in): 76 Pulse(bpm): 63 Weight(lbs): 16 Blood Pressure(mmHg): 136/73 Body Mass Index(BMI): 28 Temperature(F): 97.8 Respiratory 19 Rate(breaths/min): Photos: [2:No Photos] [4:No Photos] [5:No Photos] Wound Location: [2:Right Lower Leg - Lateral Left Malleolus - Medial] [5:Right Lower Leg - Anterior] Wounding Event: [2:Gradually Appeared] [4:Gradually Appeared] [5:Gradually Appeared] Primary Etiology: [2:Venous Leg Ulcer] [4:Venous Leg Ulcer] [5:Venous Leg Ulcer] Comorbid History: [2:Chronic Obstructive Pulmonary Disease (COPD), Coronary Artery (COPD), Coronary Artery (COPD), Coronary Artery Disease, Hypertension, Peripheral Venous Disease, Peripheral Venous Disease, Peripheral Venous Disease, Received  Chemotherapy, Received Chemotherapy, Received Chemotherapy, Received Radiation] [4:Chronic Obstructive Pulmonary Disease Disease, Hypertension, Received Radiation] [5:Chronic Obstructive Pulmonary Disease Disease, Hypertension, Received Radiation] Date Acquired: [2:03/12/2018] [4:03/12/2018] [5:02/09/2019] Weeks of Treatment: [2:8] [4:8] [5:0] Wound Status: [2:Open] [4:Open] [5:Open] Clustered Wound: [2:Yes] [4:No] [5:No] Clustered Quantity: [2:2] [4:N/A] [5:N/A] Measurements L x W x D 6.4x1x0.1 [4:0.5x0.9x0.1] [5:0.4x0.6x0.1] (cm) Area (cm) : [2:5.027] [4:0.353] [5:0.188] Volume (cm) : [2:0.503] [4:0.035] [5:0.019] % Reduction in Area: [2:98.50%] [4:95.20%] [5:N/A] % Reduction in Volume: [2:99.30%] [4:97.60%] [5:N/A] Classification: [2:Full Thickness Without Exposed Support Structures Exposed Support Structures Exposed  Support Structures] [4:Full Thickness Without] [5:Full Thickness Without] Exudate Amount: [2:Small] [4:Medium] [5:Small] Exudate Type: [2:Serosanguineous] [4:Serosanguineous] [5:Serosanguineous] Exudate Color: [2:red, brown] [4:red, brown] [5:red, brown] Wound Margin: [2:Distinct, outline attached Distinct, outline attached Distinct, outline attached] Granulation Amount: [2:Medium (34-66%)] [4:Medium (34-66%)] [5:Medium (34-66%)] Granulation Quality: [2:Red, Hyper-granulation] [4:Red, Pink] [5:Pink] Necrotic Amount: [2:Medium (34-66%)] [4:None Present (0%)] [5:Medium (34-66%)] Exposed Structures: [2:Fat Layer (Subcutaneous Fat Layer (Subcutaneous Fat Layer (Subcutaneous Tissue) Exposed: Yes Fascia: No Tendon: No Muscle: No Joint: No Bone: No] [4:Tissue) Exposed: Yes Fascia: No Tendon: No Muscle: No Joint: No Bone: No] [5:Tissue) Exposed: Yes  Fascia: No Tendon: No Muscle: No Joint: No Bone: No] Epithelialization: [2:Medium (34-66%) Compression Therapy] [4:Medium (34-66%) Compression Therapy] [5:Small (1-33%) Compression Therapy] Treatment Notes Electronic Signature(s) Signed: 02/09/2019 2:35:12 PM By: Linton Ham MD Entered By: Linton Ham on 02/09/2019 11:23:05 -------------------------------------------------------------------------------- Multi-Disciplinary Care Plan Details Patient Name: Date of Service: [name]. 02/09/2019 10:00 AM Medical Record Number:[id number] Patient Account Number: [id number] Date of Birth/Sex: Treating RN: 10/31/1935 (84 y.o. [name] Primary Care Danaysia Rader: Haynes Hoehn Other Clinician: Referring Aadhav Uhlig: Treating Travonne Schowalter/Extender:[name], [name], [name]: 8 Active Inactive Venous Leg Ulcer Nursing Diagnoses: Knowledge deficit related to disease process and management Potential for venous Insuffiency (use before diagnosis confirmed) Goals: Patient/caregiver will verbalize understanding of disease process and  disease management Date Initiated: 12/12/2018 Date Inactivated: 01/12/2019 Target Resolution Date: 01/13/2019 Goal Status: Met Verify adequate tissue perfusion prior to therapeutic compression application Date Initiated: 12/12/2018 Target Resolution Date: 02/24/2019 Goal Status: Active Interventions: Compression as ordered  Provide education on venous insufficiency Notes: Electronic Signature(s) Signed: 02/09/2019 2:55:42 PM By: Deon Pilling Entered By: Deon Pilling on 02/09/2019 10:37:40 -------------------------------------------------------------------------------- Pain Assessment Details Patient Name: Date of Service: [name], [name] 02/09/2019 10:00 AM Medical Record Number:[id number] Patient Account Number: [id number] Date of Birth/Sex: Treating RN: 05/05/1935 (84 y.o. [name] Primary Care Drevion Offord: Haynes Hoehn Other Clinician: Referring Lateria Alderman: Treating Adalae Baysinger/Extender:[name], [name], [name]: 8 Active Problems Location of Pain Severity and Description of Pain Patient Has Paino No Site Locations Pain Management and Medication Current Pain Management: Electronic Signature(s) Signed: 02/13/2019 4:32:10 PM By: Kela Millin Entered By: Kela Millin on 02/09/2019 10:14:20 -------------------------------------------------------------------------------- Patient/Caregiver Education Details Patient Name: Date of Service: [name] 12/31/2020andnbsp10:00 AM Medical Record Number:[id number] Patient Account Number: [id number] Date of Birth/Gender: Treating RN: 08-15-1935 (84 y.o. [name] Primary Care Physician: Haynes Hoehn Other Clinician: Referring Physician: Treating Physician/Extender:[name], [name], Haynes Hoehn in Treatment: 8 Education Assessment Education Provided To: Patient Education Topics Provided Venous: Handouts: Managing Venous Disease and Related Ulcers Methods: Explain/Verbal Responses:  Reinforcements needed Electronic Signature(s) Signed: 02/09/2019 2:55:42 PM By: Deon Pilling Entered By: Deon Pilling on 02/09/2019 10:37:52 -------------------------------------------------------------------------------- Wound Assessment Details Patient Name: Date of Service: [name], [name] 02/09/2019 10:00 AM Medical Record Number:[id number] Patient Account Number: [id number] Date of Birth/Sex: Treating RN: [birth year] (84 y.o. [name] Primary Care Caldonia Leap: Haynes Hoehn Other Clinician: Referring Tomer Chalmers: Treating Eren Puebla/Extender:[name], [name], [name]: 8 Wound Status Wound Number: 2 Primary Venous Leg Ulcer Etiology: Wound Location: Right Lower Leg - Lateral Wound Open Wounding Event: Gradually Appeared Status: Date Acquired: 03/12/2018 Comorbid Chronic Obstructive Pulmonary Disease Weeks Of Treatment: 8 History: (COPD), Coronary Artery Disease, Clustered Wound: Yes Hypertension, Peripheral Venous Disease, Received Chemotherapy, Received Radiation Photos Wound Measurements Length: (cm) 6.4 % Reduct Width: (cm) 1 % Reduct Depth: (cm) 0.1 Epitheli Clustered Quantity: 2 Tunnelin Area: (cm) 5.027 Undermi Volume: (cm) 0.503 Wound Description Classification: Full Thickness Without Exposed Support Foul Odo Structures Slough/F Wound Distinct, outline attached Margin: Exudate Small Amount: Exudate Serosanguineous Type: Exudate red, brown Color: Wound Bed Granulation Amount: Medium (34-66%) Granulation Quality: Red, Hyper-granulation Fascia E Necrotic Amount: Medium (34-66%) Fat Laye Necrotic Quality: Adherent Slough Tendon E Muscle E Joint Ex Bone Exp r After Cleansing: No ibrino Yes Exposed Structure xposed: No r (Subcutaneous Tissue) Exposed: Yes xposed: No xposed: No posed: No osed: No ion in Area: 98.5% ion in Volume: 99.3% alization: Medium (34-66%) g: No ning: No Treatment Notes Wound #2 (Right,  Lateral Lower Leg) 1. Cleanse With Wound Cleanser Soap and water 2. Periwound Care Moisturizing lotion TCA Cream 3. Primary Dressing Applied Calcium Alginate Ag 4. Secondary Dressing Dry Gauze 6. Support Layer Applied 3 layer compression wrap Electronic Signature(s) Signed: 02/14/2019 3:29:37 PM By: Mikeal Hawthorne EMT/HBOT Signed: 02/14/2019 5:45:44 PM By: Kela Millin Previous Signature: 02/13/2019 4:32:10 PM Version By: Kela Millin Entered By: Mikeal Hawthorne on 02/14/2019 14:35:11 -------------------------------------------------------------------------------- Wound Assessment Details Patient Name: Date of Service: [name], [name] 02/09/2019 10:00 AM Medical Record Number:[id number] Patient Account Number: [id number] Date of Birth/Sex: Treating RN: 1935-10-08 (84 y.o. [name] Primary Care Nakaya Mishkin: Haynes Hoehn Other Clinician: Referring Brennah Quraishi: Treating Harvir Patry/Extender:[name], [name], [name]: 8 Wound Status Wound Number: 4 Primary Venous Leg Ulcer Etiology: Wound Location: Left Malleolus - Medial Wound Open Wounding Event: Gradually Appeared Status: Date Acquired: 03/12/2018 Comorbid Chronic Obstructive Pulmonary Disease Weeks Of Treatment: 8 History: (COPD), Coronary Artery Disease, Clustered Wound: No Hypertension, Peripheral  Venous Disease, Received Chemotherapy, Received Radiation Photos Wound Measurements Length: (cm) 0.5 % Reduc Width: (cm) 0.9 % Reduc Depth: (cm) 0.1 Epithel Area: (cm) 0.353 Tunnel Volume: (cm) 0.035 Underm Wound Description Classification: Full Thickness Without Exposed Support Foul O Structures Slough Wound Distinct, outline attached Margin: Exudate Medium Amount: Exudate Serosanguineous Type: Exudate red, brown Color: Wound Bed Granulation Amount: Medium (34-66%) Granulation Quality: Red, Pink Fascia Necrotic Amount: None Present (0%) Fat Layer Tendon Ex Muscle Ex Joint  Exp Bone Expo dor After Cleansing: No /Fibrino No Exposed Structure Exposed: No (Subcutaneous Tissue) Exposed: Yes posed: No posed: No osed: No sed: No tion in Area: 95.2% tion in Volume: 97.6% ialization: Medium (34-66%) ing: No ining: No Treatment Notes Wound #4 (Left, Medial Malleolus) 1. Cleanse With Wound Cleanser Soap and water 2. Periwound Care Moisturizing lotion TCA Cream 3. Primary Dressing Applied Calcium Alginate Ag 4. Secondary Dressing Dry Gauze 6. Support Layer Applied 3 layer compression wrap Electronic Signature(s) Signed: 02/14/2019 3:29:37 PM By: Mikeal Hawthorne EMT/HBOT Signed: 02/14/2019 5:45:44 PM By: Kela Millin Previous Signature: 02/13/2019 4:32:10 PM Version By: Kela Millin Entered By: Mikeal Hawthorne on 02/14/2019 14:34:39 -------------------------------------------------------------------------------- Wound Assessment Details Patient Name: Date of Service: [name], [name] 02/09/2019 10:00 AM Medical Record Number:[id number] Patient Account Number: [id number] Date of Birth/Sex: Treating RN: Mar 13, 1935 (84 y.o. [name] Primary Care Merced Brougham: Haynes Hoehn Other Clinician: Referring Daney Moor: Treating Makaya Juneau/Extender:[name], [name], [name]: 8 Wound Status Wound Number: 5 Primary Venous Leg Ulcer Etiology: Wound Location: Right Lower Leg - Anterior Wound Open Wounding Event: Gradually Appeared Status: Date Acquired: 02/09/2019 Comorbid Chronic Obstructive Pulmonary Disease Weeks Of Treatment: 0 History: (COPD), Coronary Artery Disease, Clustered Wound: No Hypertension, Peripheral Venous Disease, Received Chemotherapy, Received Radiation Photos Wound Measurements Length: (cm) 0.4 % Reduct Width: (cm) 0.6 % Reduct Depth: (cm) 0.1 Epitheli Area: (cm) 0.188 Tunneli Volume: (cm) 0.019 Undermi Wound Description Full Thickness Without Exposed Support Foul Odo Classification:  Structures Slough/F Wound Distinct, outline attached Margin: Exudate Small Amount: Exudate Serosanguineous Type: Exudate red, brown Color: Wound Bed Granulation Amount: Medium (34-66%) Granulation Quality: Pink Fascia E Necrotic Amount: Medium (34-66%) Fat Laye Necrotic Quality: Adherent Slough Tendon E Muscle E Joint Ex Bone Exp r After Cleansing: No ibrino Yes Exposed Structure xposed: No r (Subcutaneous Tissue) Exposed: Yes xposed: No xposed: No posed: No osed: No ion in Area: 0% ion in Volume: 0% alization: Small (1-33%) ng: No ning: No Treatment Notes Wound #5 (Right, Anterior Lower Leg) 1. Cleanse With Wound Cleanser Soap and water 2. Periwound Care Moisturizing lotion TCA Cream 3. Primary Dressing Applied Calcium Alginate Ag 4. Secondary Dressing Dry Gauze 6. Support Layer Applied 3 layer compression wrap Electronic Signature(s) Signed: 02/14/2019 3:29:37 PM By: Mikeal Hawthorne EMT/HBOT Signed: 02/14/2019 5:45:44 PM By: Kela Millin Previous Signature: 02/13/2019 4:32:10 PM Version By: Kela Millin Entered By: Mikeal Hawthorne on 02/14/2019 14:34:18 -------------------------------------------------------------------------------- Vitals Details Patient Name: Date of Service: [name], [name] 02/09/2019 10:00 AM Medical Record Number:[id number] Patient Account Number: [id number] Date of Birth/Sex: Treating RN: 12-23-1935 (84 y.o. [name] Primary Care Lillian Tigges: Haynes Hoehn Other Clinician: Referring Slyvia Lartigue: Treating Jorje Vanatta/Extender:[name], [name], [name]: 8 Vital Signs Time Taken: 10:13 Temperature (F): 97.8 Height (in): 76 Pulse (bpm): 81 Weight (lbs): 227 Respiratory Rate (breaths/min): 19 Body Mass Index (BMI): 27.6 Blood Pressure (mmHg): 136/73 Reference Range: 80 - 120 mg / dl Electronic Signature(s) Signed: 02/13/2019 4:32:10 PM By: Kela Millin Entered By: Kela Millin on  02/09/2019  10:14:13

## 2019-02-23 ENCOUNTER — Other Ambulatory Visit: Payer: Self-pay

## 2019-02-23 ENCOUNTER — Encounter (HOSPITAL_BASED_OUTPATIENT_CLINIC_OR_DEPARTMENT_OTHER): Payer: Medicare Other | Admitting: Internal Medicine

## 2019-02-23 DIAGNOSIS — I872 Venous insufficiency (chronic) (peripheral): Secondary | ICD-10-CM | POA: Insufficient documentation

## 2019-02-23 DIAGNOSIS — I2510 Atherosclerotic heart disease of native coronary artery without angina pectoris: Secondary | ICD-10-CM | POA: Insufficient documentation

## 2019-02-23 DIAGNOSIS — Z85038 Personal history of other malignant neoplasm of large intestine: Secondary | ICD-10-CM | POA: Insufficient documentation

## 2019-02-23 DIAGNOSIS — L97518 Non-pressure chronic ulcer of other part of right foot with other specified severity: Secondary | ICD-10-CM | POA: Diagnosis not present

## 2019-02-23 DIAGNOSIS — L97328 Non-pressure chronic ulcer of left ankle with other specified severity: Secondary | ICD-10-CM | POA: Diagnosis not present

## 2019-02-23 DIAGNOSIS — Z955 Presence of coronary angioplasty implant and graft: Secondary | ICD-10-CM | POA: Diagnosis not present

## 2019-02-23 DIAGNOSIS — Z86718 Personal history of other venous thrombosis and embolism: Secondary | ICD-10-CM | POA: Diagnosis not present

## 2019-02-23 DIAGNOSIS — I10 Essential (primary) hypertension: Secondary | ICD-10-CM | POA: Diagnosis not present

## 2019-02-23 DIAGNOSIS — Z7901 Long term (current) use of anticoagulants: Secondary | ICD-10-CM | POA: Insufficient documentation

## 2019-02-23 DIAGNOSIS — I714 Abdominal aortic aneurysm, without rupture: Secondary | ICD-10-CM | POA: Diagnosis not present

## 2019-02-23 DIAGNOSIS — I890 Lymphedema, not elsewhere classified: Secondary | ICD-10-CM | POA: Insufficient documentation

## 2019-02-23 DIAGNOSIS — I87333 Chronic venous hypertension (idiopathic) with ulcer and inflammation of bilateral lower extremity: Secondary | ICD-10-CM | POA: Insufficient documentation

## 2019-02-23 DIAGNOSIS — L97819 Non-pressure chronic ulcer of other part of right lower leg with unspecified severity: Secondary | ICD-10-CM | POA: Diagnosis not present

## 2019-02-23 NOTE — Progress Notes (Addendum)
VADHIR, MCNAY ([id number]) Visit Report for 02/23/2019 Arrival Information Details Patient Name: Date of Service: DONEVAN, BILLER 02/23/2019 10:00 AM Medical Record Number:[id number] Patient Account Number: [id number] Date of Birth/Sex: Treating RN: 05-Oct-1935 (84 y.o. Ulyses Amor, [name] Primary Care Minahil Quinlivan: Haynes Hoehn Other Clinician: Referring Adelie Croswell: Treating Kayley Zeiders/Extender:[name], [name], [name]: 10 Visit Information History Since Last Visit Added or deleted any medications: No Patient Arrived: Wheel Chair Any new allergies or adverse reactions: No Arrival Time: 10:11 Had a fall or experienced change in No activities of daily living that may affect Accompanied By: spouse risk of falls: Transfer Assistance: None Signs or symptoms of abuse/neglect since last No Patient Identification Verified: Yes visito Secondary Verification Process Completed: Yes Hospitalized since last visit: No Patient Requires Transmission-Based No Implantable device outside of the clinic excluding No Precautions: cellular tissue based products placed in the center Patient Has Alerts: No since last visit: Has Dressing in Place as Prescribed: Yes Has Compression in Place as Prescribed: Yes Pain Present Now: No Electronic Signature(s) Signed: 02/23/2019 6:38:38 PM By: Baruch [name] RN, BSN Entered By: Baruch [name] on 02/23/2019 10:17:23 -------------------------------------------------------------------------------- Compression Therapy Details Patient Name: Date of Service: [name] 02/23/2019 10:00 AM Medical Record Number:[id number] Patient Account Number: [id number] Date of Birth/Sex: Treating RN: 1935/06/23 (84 y.o. [name] Primary Care Jerzey Komperda: Haynes Hoehn Other Clinician: Referring Dennys Traughber: Treating Tarena Gockley/Extender:[name], [name], [name]: 10 Compression Therapy Performed for Wound Wound #2 Right,Lateral Lower  Leg Assessment: Performed By: Clinician Levan Hurst, RN Compression Type: Three Layer Post Procedure Diagnosis Same as Pre-procedure Electronic Signature(s) Signed: 02/23/2019 6:31:26 PM By: Levan Hurst RN, BSN Entered By: Levan Hurst on 02/23/2019 10:53:55 -------------------------------------------------------------------------------- Encounter Discharge Information Details Patient Name: Date of Service: [name] 02/23/2019 10:00 AM Medical Record Number:[id number] Patient Account Number: [id number] Date of Birth/Sex: Treating RN: Jan 09, 1936 (83 y.o. [name]) Carlene Coria Primary Care Malva Diesing: Haynes Hoehn Other Clinician: Referring Caldonia Leap: Treating Liston Thum/Extender:[name], [name], [name]: 10 Encounter Discharge Information Items Discharge Condition: Stable Ambulatory Status: Wheelchair Discharge Destination: Home Transportation: Private Auto Accompanied By: wife Schedule Follow-up Appointment: Yes Clinical Summary of Care: Patient Declined Electronic Signature(s) Signed: 02/23/2019 6:24:40 PM By: Carlene Coria RN Entered By: Carlene Coria on 02/23/2019 11:31:46 -------------------------------------------------------------------------------- Lower Extremity Assessment Details Patient Name: Date of Service: BARY, LIMBACH 02/23/2019 10:00 AM Medical Record Number:[id number] Patient Account Number: [id number] Date of Birth/Sex: Treating RN: 1935-12-03 (84 y.o. [name] Primary Care Avel Ogawa: Haynes Hoehn Other Clinician: Referring Kerensa Nicklas: Treating Latayna Ritchie/Extender:[name], [name], [name]: 10 Edema Assessment Assessed: [Left: No] [Right: No] Edema: [Left: Yes] [Right: Yes] Calf Left: Right: Point of Measurement: 45 cm From Medial Instep 39 cm 37.6 cm Ankle Left: Right: Point of Measurement: 11 cm From Medial Instep 25.7 cm 25.7 cm Vascular Assessment Pulses: Dorsalis Pedis Palpable: [Left:Yes]  [Right:Yes] Electronic Signature(s) Signed: 02/23/2019 6:38:38 PM By: Baruch [name] RN, BSN Entered By: Baruch [name] on 02/23/2019 10:31:21 -------------------------------------------------------------------------------- Multi Wound Chart Details Patient Name: Date of Service: [name] 02/23/2019 10:00 AM Medical Record Number:[id number] Patient Account Number: [id number] Date of Birth/Sex: Treating RN: Sep 09, 1935 (84 y.o. M) Primary Care Azam Gervasi: Haynes Hoehn Other Clinician: Referring Arnulfo Batson: Treating Carissa Musick/Extender:[name], [name], [name]: 10 Vital Signs Height(in): 76 Pulse(bpm): 53 Weight(lbs): 227 Blood Pressure(mmHg): 123/47 Body Mass Index(BMI): 28 Temperature(F): 98.5 Respiratory 18 Rate(breaths/min): Photos: [2:No Photos] [4:No Photos] [5:No Photos] Wound Location: [2:Right Lower Leg - Lateral Left Malleolus - Medial] [5:Right  Lower Leg - Anterior] Wounding Event: [2:Gradually Appeared] [4:Gradually Appeared] [5:Gradually Appeared] Primary Etiology: [2:Venous Leg Ulcer] [4:Venous Leg Ulcer] [5:Venous Leg Ulcer] Comorbid History: [2:Chronic Obstructive Pulmonary Disease (COPD), Coronary Artery (COPD), Coronary Artery (COPD), Coronary Artery Disease, Hypertension, Peripheral Venous Disease, Peripheral Venous Disease, Peripheral Venous Disease, Received  Chemotherapy, Received Chemotherapy, Received Chemotherapy, Received Radiation] [4:Chronic Obstructive Pulmonary Disease Disease, Hypertension, Received Radiation] [5:Chronic Obstructive Pulmonary Disease Disease, Hypertension, Received Radiation] Date Acquired: [2:03/12/2018] [4:03/12/2018] [5:02/09/2019] Weeks of Treatment: [2:10] [4:10] [5:2] Wound Status: [2:Open] [4:Open] [5:Healed - Epithelialized] Clustered Wound: [2:Yes] [4:No] [5:No] Clustered Quantity: [2:2] [4:N/A] [5:N/A] Measurements L x W x D [2:5.9x0.8x0.1] [4:0.1x0.1x0.1] [5:0x0x0] (cm) Area (cm) : [2:3.707] [4:0.008]  [5:0] Volume (cm) : [2:0.371] [4:0.001] [5:0] % Reduction in Area: [2:98.90%] [4:99.90%] [5:100.00%] % Reduction in Volume: [2:99.40%] [4:99.90%] [5:100.00%] Classification: [2:Full Thickness Without Exposed Support Structures Exposed Support Structures Exposed Support Structures] [4:Full Thickness Without] [5:Full Thickness Without] Exudate Amount: [2:Medium] [4:None Present] [5:None Present] Exudate Type: [2:Serosanguineous] [4:N/A] [5:N/A] Exudate Color: [2:red, brown] [4:N/A] [5:N/A] Wound Margin: [2:Distinct, outline attached Distinct, outline attached Distinct, outline attached] Granulation Amount: [2:Small (1-33%)] [4:Small (1-33%)] [5:None Present (0%)] Granulation Quality: [2:Red, Pink] [4:Pink] [5:N/A] Necrotic Amount: [2:Large (67-100%)] [4:None Present (0%)] [5:None Present (0%)] Exposed Structures: [2:Fat Layer (Subcutaneous Fat Layer (Subcutaneous Fascia: No Tissue) Exposed: Yes Fascia: No Tendon: No Muscle: No Joint: No Bone: No] [4:Tissue) Exposed: Yes Fascia: No Tendon: No Muscle: No Joint: No Bone: No] [5:Fat Layer (Subcutaneous Tissue)  Exposed: No Tendon: No Muscle: No Joint: No Bone: No] Epithelialization: [2:Small (1-33%) Compression Therapy] [4:Large (67-100%) N/A] [5:Large (67-100%) N/A] Treatment Notes Electronic Signature(s) Signed: 02/23/2019 5:49:05 PM By: Linton Ham MD Entered By: Linton Ham on 02/23/2019 11:08:07 -------------------------------------------------------------------------------- Multi-Disciplinary Care Plan Details Patient Name: Date of Service: [name] 02/23/2019 10:00 AM Medical Record Number:[id number] Patient Account Number: [id number] Date of Birth/Sex: Treating RN: Jul 25, 1935 (84 y.o. [name] Primary Care Naiyah Klostermann: Haynes Hoehn Other Clinician: Referring Stefhanie Kachmar: Treating Jemel Ono/Extender:[name], [name], [name]: 10 Active Inactive Wound/Skin Impairment Nursing Diagnoses: Impaired  tissue integrity Knowledge deficit related to smoking impact on wound healing Knowledge deficit related to ulceration/compromised skin integrity Goals: Patient/caregiver will verbalize understanding of skin care regimen Date Initiated: 02/23/2019 Target Resolution Date: 03/24/2019 Goal Status: Active Ulcer/skin breakdown will have a volume reduction of 30% by week 4 Date Inactivated: 02/09/2019 Target Resolution Date Initiated: 12/12/2018 Date: 02/03/2019 Goal Status: Met Interventions: Provide education on ulcer and skin care Notes: Electronic Signature(s) Signed: 02/23/2019 6:31:26 PM By: Levan Hurst RN, BSN Entered By: Levan Hurst on 02/23/2019 17:48:36 -------------------------------------------------------------------------------- Pain Assessment Details Patient Name: Date of Service: [name] 02/23/2019 10:00 AM Medical Record Number:[id number] Patient Account Number: [id number] Date of Birth/Sex: Treating RN: 02/20/1935 (84 y.o. [name] Primary Care Brittinee Risk: Haynes Hoehn Other Clinician: Referring Champagne Paletta: Treating Cadarius Nevares/Extender:[name], [name], [name]: 10 Active Problems Location of Pain Severity and Description of Pain Patient Has Paino No Site Locations Rate the pain. Current Pain Level: 0 Character of Pain Describe the Pain: Tender Pain Management and Medication Current Pain Management: Electronic Signature(s) Signed: 02/23/2019 6:38:38 PM By: Baruch [name] RN, BSN Entered By: Baruch [name] on 02/23/2019 10:29:53 -------------------------------------------------------------------------------- Patient/Caregiver Education Details Patient Name: Date of Service: Aguinaldo, Rithik G. 1/14/2021andnbsp10:00 AM Medical Record Number:[id number] Patient Account Number: [id number] Date of Birth/Gender: Treating RN: [birth year] (84 y.o. [name] Primary Care Physician: Haynes Hoehn Other Clinician: Referring  Physician: Treating Physician/Extender:[name], [name], [name]: 10 Education  Assessment Education Provided To: Patient Education Topics Provided Wound/Skin Impairment: Methods: Explain/Verbal Responses: State content correctly Electronic Signature(s) Signed: 02/23/2019 6:31:26 PM By: Levan Hurst RN, BSN Entered By: Levan Hurst on 02/23/2019 10:29:55 -------------------------------------------------------------------------------- Wound Assessment Details Patient Name: Date of Service: Starn, Charels G. 02/23/2019 10:00 AM Medical Record Number:[id number] Patient Account Number: [id number] Date of Birth/Sex: Treating RN: [birth year] (84 y.o. [name] Primary Care Ellis Koffler: Haynes Hoehn Other Clinician: Referring Aysha Livecchi: Treating Bently Wyss/Extender:[name], [name], [name]: 10 Wound Status Wound Number: 2 Primary Venous Leg Ulcer Etiology: Wound Location: Right Lower Leg - Lateral Wound Open Wounding Event: Gradually Appeared Status: Date Acquired: 03/12/2018 Comorbid Chronic Obstructive Pulmonary Disease Weeks Of Treatment: 10 History: (COPD), Coronary Artery Disease, Clustered Wound: Yes Hypertension, Peripheral Venous Disease, Received Chemotherapy, Received Radiation Photos Wound Measurements Length: (cm) 5.9 % Reduct Width: (cm) 0.8 % Reduct Depth: (cm) 0.1 Epitheli Clustered Quantity: 2 Tunnelin Area: (cm) 3.707 Undermi Volume: (cm) 0.371 Wound Description Classification: Full Thickness Without Exposed Support Foul Odo Structures Slough/F Wound Distinct, outline attached Margin: Exudate Medium Amount: Exudate Serosanguineous Type: Exudate red, brown Color: Wound Bed Granulation Amount: Small (1-33%) Granulation Quality: Red, Pink Fascia E Necrotic Amount: Large (67-100%) Fat Laye Necrotic Quality: Adherent Slough Tendon E Muscle E Joint Ex Bone Exp r After Cleansing: No ibrino Yes Exposed  Structure xposed: No r (Subcutaneous Tissue) Exposed: Yes xposed: No xposed: No posed: No osed: No ion in Area: 98.9% ion in Volume: 99.4% alization: Small (1-33%) g: No ning: No Treatment Notes Wound #2 (Right, Lateral Lower Leg) 1. Cleanse With Wound Cleanser Soap and water 3. Primary Dressing Applied Calcium Alginate Ag Hydrofera Blue 6. Support Layer Applied 3 layer compression wrap Notes netting Electronic Signature(s) Signed: 02/27/2019 5:35:37 PM By: Baruch [name] RN, BSN Signed: 02/28/2019 4:37:08 PM By: Mikeal Hawthorne EMT/HBOT Previous Signature: 02/23/2019 6:38:38 PM Version By: Baruch [name] RN, BSN Entered By: Mikeal Hawthorne on 02/27/2019 16:00:18 -------------------------------------------------------------------------------- Wound Assessment Details Patient Name: Date of Service: Huyser, Meryl G. 02/23/2019 10:00 AM Medical Record Number:[id number] Patient Account Number: [id number] Date of Birth/Sex: Treating RN: 08-22-1935 (84 y.o. [name] Primary Care Celia Gibbons: Haynes Hoehn Other Clinician: Referring Consuello Lassalle: Treating Aloise Copus/Extender:[name], [name], [name]: 10 Wound Status Wound Number: 4 Primary Venous Leg Ulcer Etiology: Wound Location: Left Malleolus - Medial Wound Open Wounding Event: Gradually Appeared Status: Date Acquired: 03/12/2018 Comorbid Chronic Obstructive Pulmonary Disease Weeks Of Treatment: 10 History: (COPD), Coronary Artery Disease, Clustered Wound: No Hypertension, Peripheral Venous Disease, Received Chemotherapy, Received Radiation Photos Wound Measurements Length: (cm) 0.1 % Reduc Width: (cm) 0.1 % Reduc Depth: (cm) 0.1 Epithel Area: (cm) 0.008 Tunnel Volume: (cm) 0.001 Underm Wound Description Full Thickness Without Exposed Support Foul Od Classification: Structures Slough/ Wound Distinct, outline attached Margin: Exudate None Present Amount: Wound Bed Granulation Amount:  Small (1-33%) Granulation Quality: Pink Fascia Necrotic Amount: None Present (0%) Fat Lay Tendon Muscle Joint E Bone Ex Treatment Notes Wound #4 (Left, Medial Malleolus) 1. Cleanse With Wound Cleanser 4. Secondary Dressing Foam Border Dressing or After Cleansing: No Fibrino No Exposed Structure Exposed: No er (Subcutaneous Tissue) Exposed: Yes Exposed: No Exposed: No xposed: No posed: No tion in Area: 99.9% tion in Volume: 99.9% ialization: Large (67-100%) ing: No ining: No Electronic Signature(s) Signed: 02/27/2019 5:35:37 PM By: Baruch [name] RN, BSN Signed: 02/28/2019 4:37:08 PM By: Mikeal Hawthorne EMT/HBOT Previous Signature: 02/23/2019 6:38:38 PM Version By: Baruch [name] RN, BSN Entered By: Mikeal Hawthorne on 02/27/2019 16:03:15 -------------------------------------------------------------------------------- Wound  Assessment Details Patient Name: Date of Service: TAAJ, HURLBUT 02/23/2019 10:00 AM Medical Record Number:[id number] Patient Account Number: [id number] Date of Birth/Sex: Treating RN: 19-Sep-1935 (84 y.o. [name] Primary Care Tiwanda Threats: Haynes Hoehn Other Clinician: Referring Evan Osburn: Treating Jordy Verba/Extender:[name], [name], [name]: 10 Wound Status Wound Number: 5 Primary Venous Leg Ulcer Etiology: Wound Location: Right Lower Leg - Anterior Wound Healed - Epithelialized Wounding Event: Gradually Appeared Status: Date Acquired: 02/09/2019 Comorbid Chronic Obstructive Pulmonary Disease Weeks Of Treatment: 2 History: (COPD), Coronary Artery Disease, Clustered Wound: No Hypertension, Peripheral Venous Disease, Received Chemotherapy, Received Radiation Photos Wound Measurements Length: (cm) 0 % Reductio Width: (cm) 0 % Reductio Depth: (cm) 0 Epithelial Area: (cm) 0 Tunneling Volume: (cm) 0 Undermini Wound Description Full Thickness Without Exposed Support Foul Odor Full Thickness Without Exposed Support  Foul Od Classification: Structures Slough/ Wound Distinct, outline attached Margin: Exudate None Present Amount: Wound Bed Granulation Amount: None Present (0%) Necrotic Amount: None Present (0%) Fascia Fat Lay Tendon Muscle Joint E Bone Ex After Cleansing: No or After Cleansing: No Fibrino No Exposed Structure Exposed: No er (Subcutaneous Tissue) Exposed: No Exposed: No Exposed: No xposed: No posed: No n in Area: 100% n in Volume: 100% ization: Large (67-100%) : No ng: No Electronic Signature(s) Signed: 02/27/2019 5:35:37 PM By: Baruch [name] RN, BSN Signed: 02/28/2019 4:37:08 PM By: Mikeal Hawthorne EMT/HBOT Previous Signature: 02/23/2019 6:38:38 PM Version By: Baruch [name] RN, BSN Entered By: Mikeal Hawthorne on 02/27/2019 16:01:55 -------------------------------------------------------------------------------- Vitals Details Patient Name: Date of Service: Armstrong, Advait G. 02/23/2019 10:00 AM Medical Record Number:[id number] Patient Account Number: [id number] Date of Birth/Sex: Treating RN: June 01, 1935 (84 y.o. [name] Primary Care Fardowsa Authier: Haynes Hoehn Other Clinician: Referring Randel Hargens: Treating Brycin Kille/Extender:[name], [name], [name]: 10 Vital Signs Time Taken: 10:17 Temperature (F): 98.5 Height (in): 76 Pulse (bpm): 53 Source: Stated Respiratory Rate (breaths/min): 18 Weight (lbs): 227 Blood Pressure (mmHg): 123/47 Source: Stated Reference Range: 80 - 120 mg / dl Body Mass Index (BMI): 27.6 Electronic Signature(s) Signed: 02/23/2019 6:38:38 PM By: Baruch [name] RN, BSN Entered By: Baruch [name] on 02/23/2019 10:18:42

## 2019-02-24 ENCOUNTER — Other Ambulatory Visit: Payer: Self-pay

## 2019-02-24 DIAGNOSIS — I82409 Acute embolism and thrombosis of unspecified deep veins of unspecified lower extremity: Secondary | ICD-10-CM

## 2019-02-24 MED ORDER — APIXABAN 5 MG PO TABS: 5.0000 mg | ORAL_TABLET | Freq: Every day | ORAL | 3 refills | Status: DC

## 2019-02-24 NOTE — Progress Notes (Signed)
[name], [name] (PK:[id number]) Visit Report for 02/23/2019 HPI Details Patient Name: Date of Service: [name], [name] 02/23/2019 10:00 AM Medical Record Number:[id number] Patient Account Number: [id number] Date of Birth/Sex: Treating RN: 1935-09-14 (84 y.o. M) Primary Care Provider: Haynes Hoehn Other Clinician: Referring Provider: Treating Provider/Extender:Bita Cartwright, [name], [name]: 10 History of Present Illness HPI Description: ADMISSION 12/12/2018 This is an 84 year old man who is a very complicated patient. He has apparently been followed at the wound care center at [facility] in [place] for a number of years with ulcers that have been described as secondary to chronic venous insufficiency with secondary lymphedema. His wife states that these will come and go she has been to that center multiple times. Most of the recent wounds have apparently been on the left leg. She states that at the end of September she started to see brown spots developing on the right leg which progressed and moved into necrotic areas on multiple areas of the right lower leg. Also spots on the dorsal feet. He started to develop generalized weakness could not walk. He was admitted for 1 day in early October to [facility] but was discharged and told that he had a UTI. He was then admitted from 11/12/2018 through 11/22/2018. He was felt to have bilateral lower extremity cellulitis on the background of lymphedema and venous stasis ulceration. He was treated with broad-spectrum antibiotics. He was reviewed by Dr. Sharol Given and provided with some form of compression stocking although the patient states that the drainage from his wound stuck to these and cause damage to the skin when these were taken off. He has since been discharged to skilled facility associated with [facility]. The patient's wife is quite descriptive although unfortunately she did not actually take pictures of the  wound development. She stated that they had never seen anything like this before. Then there was the deterioration with regards to his mobility. I am not sure that that is gotten any better. Past medical history; hypertension, BPH, coronary artery disease with stents, malignant tumor of the colon, abdominal aortic aneurysm followed with annual ultrasounds but I am not really sure who is following this ABIs in our clinic were 0.74 on the right 0.61 on the left 11/16; patient's appointment with Dr. Donzetta Matters of vascular surgery is not till 10/23. I did put in a secure text message about this patient. He comes in today with some multiple wound areas on the right leg looking a lot better. Most substantially the wounds are located on the right lateral lower leg. On the left there is the left medial calcaneus. The patient clearly has chronic venous insufficiency with secondary lymphedema however I wondered whether he had macrovascular disease and/or some of the damage on the right leg could be related to a vasculopathy. In any case today things look substantially better than last week. The patient is still at [facility] skilled facility 12/3; since the patient was last here 2-1/2 weeks ago he is been admitted to the hospital for procedure by Dr. Donzetta Matters. At some point he was also found to have a DVT in the right femoral vein. He is on anticoagulation. He underwent aortogram with bilateral lower extremity angiograms on 01/09/2019. This showed the aorta and iliac segments to be tortuous but no flow-limiting stenosis. Bilateral he has SFA nonlimiting stenosis although heavily calcified. He has took two-vessel runoff bilaterally which are quite large vessels. From the tone of this note I really did not think that there was  felt to be any macrovascular stenosis. This leaves the initial appearance of his legs with multiple right greater than left lower extremity punched out wounds somewhat difficult to explain in my  mind. I do not think this had anything to do with venous disease either reflux or clots In the meantime his legs are actually doing quite better. We have been using silver alginate Curlex and Coban. He was discharged from [facility] and is now at home. He is actually doing quite well 12/17 the patient has a small remaining area on the left medial ankle. 3 areas on the right lateral calf that still requiring debridement. We have been using silver alginate. 12/31; the patient has a small area on the left medial ankle that is still open. The areas on the lateral calf are improved now measuring 2 areas. We have been using silver alginate under compression. 1/14; we have the right lateral calf that is still open. Area on the left medial ankle is almost closed. He has severe bilateral venous hypertension with brawny deposits of hemosiderin. Once again I have reviewed his history. He arrived in clinic today with large right greater than left necrotic wounds in his bilateral lower extremities. When I first saw this I felt that this was probably secondary to some form of microvascular ischemia possibly cholesterol emboli. He underwent an angiogram that did not show flow-limiting stenosis. 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 The patient tells me he is out of this Eliquis but according to my review of my records I cannot tell exactly when this was started. We are using silver alginate on the 1 remaining wound His wife reminds me that this is not the first go round with this although I do not have any information on this in particular Electronic Signature(s) Signed: 02/23/2019 5:49:05 PM By: Linton Ham MD Entered By: Linton Ham on 02/23/2019 11:15:53 -------------------------------------------------------------------------------- Physical Exam Details Patient Name: Date of Service: [name]. 02/23/2019 10:00 AM Medical Record  Number:[id number] Patient Account Number: [id number] Date of Birth/Sex: Treating RN: 03/04/1935 (84 y.o. M) Primary Care Provider: Haynes Hoehn Other Clinician: Referring Provider: Treating Provider/Extender:Stesha Neyens, [name], [name]: 10 Constitutional Sitting or standing Blood Pressure is within target range for patient.. Pulse regular and within target range for patient.Marland Kitchen Respirations regular, non-labored and within target range.. Temperature is normal and within the target range for the patient.Marland Kitchen Appears in no distress. Cardiovascular Pedal pulses palpable and strong bilaterally.. Edema is reasonably well controlled. Changes of chronic venous insufficiency.. Notes Wound exam; his edema control is better than when he was here 2 weeks ago. He has a remaining open area on the right lateral lower extremity this is clean no debridement is required and is smaller than last time. The area on the left medial I think is just about closed but still vulnerable. He has Juzo stockings that we ordered for him last time Electronic Signature(s) Signed: 02/23/2019 5:49:05 PM By: Linton Ham MD Entered By: Linton Ham on 02/23/2019 11:18:23 -------------------------------------------------------------------------------- Physician Orders Details Patient Name: Date of Service: [name]. 02/23/2019 10:00 AM Medical Record Number:[id number] Patient Account Number: [id number] Date of Birth/Sex: Treating RN: 10-07-1935 (84 y.o. [name] Primary Care Provider: Haynes Hoehn Other Clinician: Referring Provider: Treating Provider/Extender:Natosha Bou, [name], [name]: 10 Verbal / Phone Orders: No Diagnosis Coding ICD-10 Coding Code Description I87.333 Chronic venous hypertension (idiopathic) with ulcer and inflammation of  bilateral lower extremity L97.818 Non-pressure chronic ulcer of other part of right lower leg with other specified  severity L97.518 Non-pressure chronic ulcer of other part of right foot with other specified severity L97.328 Non-pressure chronic ulcer of left ankle with other specified severity Follow-up Appointments Return Appointment in 2 weeks. Dressing Change Frequency Wound #2 Right,Lateral Lower Leg Other: - change twice a week. wound center every two weeks. Wound #4 Left,Medial Malleolus Other: - change twice a week. wound center every two weeks. Skin Barriers/Peri-Wound Care TCA Cream or Ointment - both legs - mixed with lotion in clinic. Patient to use the TCA/cetaphil lotion mix at home with dressing changes. Wound Cleansing Wound #2 Right,Lateral Lower Leg May shower and wash wound with soap and water. - on days that dressing is changed Wound #4 Left,Medial Malleolus May shower and wash wound with soap and water. - on days that dressing is changed Primary Wound Dressing Wound #2 Right,Lateral Lower Leg Hydrofera Blue Wound #4 Left,Medial Malleolus Foam Secondary Dressing Wound #2 Right,Lateral Lower Leg Dry Gauze ABD pad Edema Control 3 Layer Compression System - Right Lower Extremity Avoid standing for long periods of time Elevate legs to the level of the heart or above for 30 minutes daily and/or when sitting, a frequency of: - throughout the day Support Garment 20-30 mm/Hg pressure to: - Jobst Farrow Wrap 4000 to left leg Off-Loading Turn and reposition every 2 hours [facility] skilled [facility] for wound care. - [facility] change twice a week. wound center every two weeks. Electronic Signature(s) Signed: 02/23/2019 5:49:05 PM By: Linton Ham MD Signed: 02/23/2019 6:31:26 PM By: Levan Hurst RN, BSN Entered By: Levan Hurst on 02/23/2019 10:51:42 -------------------------------------------------------------------------------- Problem List Details Patient Name: Date of Service: Hallinan, [name] 02/23/2019 10:00 AM Medical Record  Number:[id number] Patient Account Number: [id number] Date of Birth/Sex: Treating RN: April 21, 1935 (84 y.o. [name] Primary Care Provider: Haynes Hoehn Other Clinician: Referring Provider: Treating Provider/Extender:Savi Lastinger, [name], [name]: 10 Active Problems ICD-10 Evaluated Encounter Code Description Active Date Today Diagnosis I87.333 Chronic venous hypertension (idiopathic) with ulcer 12/12/2018 No Yes and inflammation of bilateral lower extremity L97.328 Non-pressure chronic ulcer of left ankle with other 12/12/2018 No Yes specified severity L97.818 Non-pressure chronic ulcer of other part of right lower 12/12/2018 No Yes leg with other specified severity Inactive Problems ICD-10 Code Description Active Date Inactive Date L97.518 Non-pressure chronic ulcer of other part of right foot with other 12/12/2018 12/12/2018 specified severity Resolved Problems Electronic Signature(s) Signed: 02/23/2019 5:49:05 PM By: Linton Ham MD Entered By: Linton Ham on 02/23/2019 11:07:58 -------------------------------------------------------------------------------- Progress Note Details Patient Name: Date of Service: [name]. 02/23/2019 10:00 AM Medical Record Number:[id number] Patient Account Number: [id number] Date of Birth/Sex: Treating RN: 03-24-1935 (84 y.o. M) Primary Care Provider: Haynes Hoehn Other Clinician: Referring Provider: Treating Provider/Extender:Sulo Janczak, [name], [name]: 10 Subjective History of Present Illness (HPI) ADMISSION 12/12/2018 This is an 84 year old man who is a very complicated patient. He has apparently been followed at the wound care center at [facility] in [place] for a number of years with ulcers that have been described as secondary to chronic venous insufficiency with secondary lymphedema. His wife states that these will come and go she has been to that center multiple times. Most of  the recent wounds have apparently been on the left leg. She states that at the end of September she started to see brown spots developing on the right leg  which progressed and moved into necrotic areas on multiple areas of the right lower leg. Also spots on the dorsal feet. He started to develop generalized weakness could not walk. He was admitted for 1 day in early October to [facility] but was discharged and told that he had a UTI. He was then admitted from 11/12/2018 through 11/22/2018. He was felt to have bilateral lower extremity cellulitis on the background of lymphedema and venous stasis ulceration. He was treated with broad-spectrum antibiotics. He was reviewed by Dr. Sharol Given and provided with some form of compression stocking although the patient states that the drainage from his wound stuck to these and cause damage to the skin when these were taken off. He has since been discharged to skilled facility associated with [facility]. The patient's wife is quite descriptive although unfortunately she did not actually take pictures of the wound development. She stated that they had never seen anything like this before. Then there was the deterioration with regards to his mobility. I am not sure that that is gotten any better. Past medical history; hypertension, BPH, coronary artery disease with stents, malignant tumor of the colon, abdominal aortic aneurysm followed with annual ultrasounds but I am not really sure who is following this ABIs in our clinic were 0.74 on the right 0.61 on the left 11/16; patient's appointment with Dr. Donzetta Matters of vascular surgery is not till 10/23. I did put in a secure text message about this patient. He comes in today with some multiple wound areas on the right leg looking a lot better. Most substantially the wounds are located on the right lateral lower leg. On the left there is the left medial calcaneus. The patient clearly has chronic venous  insufficiency with secondary lymphedema however I wondered whether he had macrovascular disease and/or some of the damage on the right leg could be related to a vasculopathy. In any case today things look substantially better than last week. The patient is still at [facility] skilled facility 12/3; since the patient was last here 2-1/2 weeks ago he is been admitted to the hospital for procedure by Dr. Donzetta Matters. At some point he was also found to have a DVT in the right femoral vein. He is on anticoagulation. He underwent aortogram with bilateral lower extremity angiograms on 01/09/2019. This showed the aorta and iliac segments to be tortuous but no flow-limiting stenosis. Bilateral he has SFA nonlimiting stenosis although heavily calcified. He has took two-vessel runoff bilaterally which are quite large vessels. From the tone of this note I really did not think that there was felt to be any macrovascular stenosis. This leaves the initial appearance of his legs with multiple right greater than left lower extremity punched out wounds somewhat difficult to explain in my mind. I do not think this had anything to do with venous disease either reflux or clots In the meantime his legs are actually doing quite better. We have been using silver alginate Curlex and Coban. He was discharged from [facility] and is now at home. He is actually doing quite well 12/17 the patient has a small remaining area on the left medial ankle. 3 areas on the right lateral calf that still requiring debridement. We have been using silver alginate. 12/31; the patient has a small area on the left medial ankle that is still open. The areas on the lateral calf are improved now measuring 2 areas. We have been using silver alginate under compression. 1/14; we have the right lateral  calf that is still open. Area on the left medial ankle is almost closed. He has severe bilateral venous hypertension with brawny deposits of  hemosiderin. Once again I have reviewed his history. He arrived in clinic today with large right greater than left necrotic wounds in his bilateral lower extremities. When I first saw this I felt that this was probably secondary to some form of microvascular ischemia possibly cholesterol emboli. He underwent an angiogram that did not show flow-limiting stenosis. 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 The patient tells me he is out of this Eliquis but according to my review of my records I cannot tell exactly when this was started. We are using silver alginate on the 1 remaining wound His wife reminds me that this is not the first go round with this although I do not have any information on this in particular Objective Constitutional Sitting or standing Blood Pressure is within target range for patient.. Pulse regular and within target range for patient.Marland Kitchen Respirations regular, non-labored and within target range.. Temperature is normal and within the target range for the patient.Marland Kitchen Appears in no distress. Vitals Time Taken: 10:17 AM, Height: 76 in, Source: Stated, Weight: 227 lbs, Source: Stated, BMI: 27.6, Temperature: 98.5 F, Pulse: 53 bpm, Respiratory Rate: 18 breaths/min, Blood Pressure: 123/47 mmHg. Cardiovascular Pedal pulses palpable and strong bilaterally.. Edema is reasonably well controlled. Changes of chronic venous insufficiency.. General Notes: Wound exam; his edema control is better than when he was here 2 weeks ago. He has a remaining open area on the right lateral lower extremity this is clean no debridement is required and is smaller than last time. The area on the left medial I think is just about closed but still vulnerable. He has Juzo stockings that we ordered for him last time Integumentary (Hair, Skin) Wound #2 status is Open. Original cause of wound was Gradually Appeared. The wound is located on the Right,Lateral Lower Leg.  The wound measures 5.9cm length x 0.8cm width x 0.1cm depth; 3.707cm^2 area and 0.371cm^3 volume. There is Fat Layer (Subcutaneous Tissue) Exposed exposed. There is no tunneling or undermining noted. There is a medium amount of serosanguineous drainage noted. The wound margin is distinct with the outline attached to the wound base. There is small (1-33%) red, pink granulation within the wound bed. There is a large (67-100%) amount of necrotic tissue within the wound bed including Adherent Slough. Wound #4 status is Open. Original cause of wound was Gradually Appeared. The wound is located on the Left,Medial Malleolus. The wound measures 0.1cm length x 0.1cm width x 0.1cm depth; 0.008cm^2 area and 0.001cm^3 volume. There is Fat Layer (Subcutaneous Tissue) Exposed exposed. There is no tunneling or undermining noted. There is a none present amount of drainage noted. The wound margin is distinct with the outline attached to the wound base. There is small (1-33%) pink granulation within the wound bed. There is no necrotic tissue within the wound bed. Wound #5 status is Healed - Epithelialized. Original cause of wound was Gradually Appeared. The wound is located on the Right,Anterior Lower Leg. The wound measures 0cm length x 0cm width x 0cm depth; 0cm^2 area and 0cm^3 volume. There is no tunneling or undermining noted. There is a none present amount of drainage noted. The wound margin is distinct with the outline attached to the wound base. There is no granulation within the wound bed. There is no necrotic tissue within the  wound bed. Assessment Active Problems ICD-10 Chronic venous hypertension (idiopathic) with ulcer and inflammation of bilateral lower extremity Non-pressure chronic ulcer of left ankle with other specified severity Non-pressure chronic ulcer of other part of right lower leg with other specified severity Procedures Wound #2 Pre-procedure diagnosis of Wound #2 is a Venous Leg  Ulcer located on the Right,Lateral Lower Leg . There was a Three Layer Compression Therapy Procedure by Levan Hurst, RN. Post procedure Diagnosis Wound #2: Same as Pre-Procedure Plan Follow-up Appointments: Return Appointment in 2 weeks. Dressing Change Frequency: Wound #2 Right,Lateral Lower Leg: Other: - change twice a week. wound center every two weeks. Wound #4 Left,Medial Malleolus: Other: - change twice a week. wound center every two weeks. Skin Barriers/Peri-Wound Care: TCA Cream or Ointment - both legs - mixed with lotion in clinic. Patient to use the TCA/cetaphil lotion mix at home with dressing changes. Wound Cleansing: Wound #2 Right,Lateral Lower Leg: May shower and wash wound with soap and water. - on days that dressing is changed Wound #4 Left,Medial Malleolus: May shower and wash wound with soap and water. - on days that dressing is changed Primary Wound Dressing: Wound #2 Right,Lateral Lower Leg: Hydrofera Blue Wound #4 Left,Medial Malleolus: Foam Secondary Dressing: Wound #2 Right,Lateral Lower Leg: Dry Gauze ABD pad Edema Control: 3 Layer Compression System - Right Lower Extremity Avoid standing for long periods of time Elevate legs to the level of the heart or above for 30 minutes daily and/or when sitting, a frequency of: - throughout the day Support Garment 20-30 mm/Hg pressure to: - Jobst Farrow Wrap 4000 to left leg Off-Loading: Turn and reposition every 2 hours Home Health: [facility] skilled [facility] for wound care. - [facility] change twice a week. wound center every two weeks. 1. I think the patient's wounds are doing quite well. We can use simply a border foam on the left medial malleolus under the patient's own stocking. 2. Change to [facility] on the right lower leg [facility] has [facility] 3. With regards to the rt leg dvt DVT this was diagnosed at the time of his acute illness. He says Dr. Jamse Mead gave him three 1  month prescriptions. I am not sure if he felt this was adequate or not. I have advised him to call Dr. Claretha Cooper office 4. I still have an uncomfortable feeling about the pathogenesis of these wounds I do not think they were secondary to arterial or venous insufficiency although he has some degree of both. His angiogram did not suggest flow-limiting stenosis although there were vessel calcifications. I continue to think this might have been secondary to some microvascular interruption the differential diagnosis of which is quite large. The necrosis and new number of wounds when he first presented was really very impressive Electronic Signature(s) Signed: 02/23/2019 5:49:05 PM By: Linton Ham MD Entered By: Linton Ham on 02/23/2019 11:20:44 -------------------------------------------------------------------------------- SuperBill Details Patient Name: Date of Service: [name] 02/23/2019 Medical Record Number:[id number] Patient Account Number: [id number] Date of Birth/Sex: Treating RN: 05-Dec-1935 (84 y.o. M) Primary Care Provider: Haynes Hoehn Other Clinician: Referring Provider: Treating Provider/Extender:Deontra Pereyra, [name], [name]: 10 Diagnosis Coding ICD-10 Codes Code Description I87.333 Chronic venous hypertension (idiopathic) with ulcer and inflammation of bilateral lower extremity L97.818 Non-pressure chronic ulcer of other part of right lower leg with other specified severity L97.328 Non-pressure chronic ulcer of left ankle with other specified severity Facility Procedures Physician Procedures CPT4: Code [id number] Description: 213 - WC  PHYS LEVEL 3 - EST PT ICD-10 Diagnosis Description L97.818 Non-pressure chronic ulcer of other part of right lower l severity L97.328 Non-pressure chronic ulcer of left ankle with other speci I87.333 Chronic venous hypertension  (idiopathic) with ulcer and i lower extremity Modifier: eg with other s fied severity  nflammation of Quantity: 1 pecified bilateral Electronic Signature(s) Signed: 02/23/2019 6:31:26 PM By: Levan Hurst RN, BSN Signed: 02/24/2019 6:20:19 PM By: Linton Ham MD Previous Signature: 02/23/2019 5:49:05 PM Version By: Linton Ham MD Entered By: Levan Hurst on 02/23/2019 17:48:53

## 2019-02-25 ENCOUNTER — Other Ambulatory Visit: Payer: Self-pay | Admitting: Vascular Surgery

## 2019-02-25 DIAGNOSIS — I82409 Acute embolism and thrombosis of unspecified deep veins of unspecified lower extremity: Secondary | ICD-10-CM

## 2019-03-07 ENCOUNTER — Other Ambulatory Visit: Payer: Self-pay

## 2019-03-07 ENCOUNTER — Encounter (HOSPITAL_BASED_OUTPATIENT_CLINIC_OR_DEPARTMENT_OTHER): Payer: Medicare Other | Attending: Internal Medicine | Admitting: Internal Medicine

## 2019-03-07 DIAGNOSIS — I87333 Chronic venous hypertension (idiopathic) with ulcer and inflammation of bilateral lower extremity: Secondary | ICD-10-CM | POA: Diagnosis not present

## 2019-03-08 NOTE — Progress Notes (Signed)
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 bilatera ith other specified severity Quantity: 1 l Electronic Signature(s) Signed: 03/07/2019 5:32:11 PM By: Linton Ham MD Entered By: Linton Ham on 03/07/2019 16:11:44

## 2019-03-09 ENCOUNTER — Encounter (HOSPITAL_BASED_OUTPATIENT_CLINIC_OR_DEPARTMENT_OTHER): Payer: Medicare Other | Admitting: Internal Medicine

## 2019-03-10 ENCOUNTER — Encounter (HOSPITAL_BASED_OUTPATIENT_CLINIC_OR_DEPARTMENT_OTHER): Payer: Medicare Other | Admitting: Internal Medicine

## 2019-03-13 NOTE — Progress Notes (Signed)
HADRIAN, YARBROUGH ([id number]) Visit Report for 03/07/2019 Arrival Information Details Patient Name: Date of Service: LOGEN, HEINTZELMAN 03/07/2019 2:30 PM Medical Record Number:[id number] Patient Account Number: [id number] Date of Birth/Sex: Treating RN: 04-May-1935 (84 y.o. [name] Primary Care Rifka Ramey: Haynes Hoehn Other Clinician: Referring Amani Marseille: Treating Jerry Clyne/Extender:[name], [name], [name]: 12 Visit Information History Since Last Visit Added or deleted any medications: No Patient Arrived: Wheel Chair Any new allergies or adverse reactions: No Arrival Time: 15:06 Had a fall or experienced change in No activities of daily living that may affect Accompanied By: wife risk of falls: Transfer Assistance: Manual Signs or symptoms of abuse/neglect since last No Patient Identification Verified: Yes visito Secondary Verification Process Completed: Yes Hospitalized since last visit: No Patient Requires Transmission-Based No Implantable device outside of the clinic excluding No Precautions: cellular tissue based products placed in the center Patient Has Alerts: No since last visit: Has Dressing in Place as Prescribed: Yes Has Compression in Place as Prescribed: Yes Pain Present Now: No Electronic Signature(s) Signed: 03/13/2019 6:39:44 PM By: Levan Hurst RN, BSN Entered By: Levan Hurst on 03/07/2019 15:06:53 -------------------------------------------------------------------------------- Compression Therapy Details Patient Name: Date of Service: [name] 03/07/2019 2:30 PM Medical Record Number:[id number] Patient Account Number: [id number] Date of Birth/Sex: Treating RN: 10-Oct-1935 (83 y.o. [name]) Carlene Coria Primary Care Chanson Teems: Haynes Hoehn Other Clinician: Referring Hadleigh Felber: Treating Laurenashley Viar/Extender:[name], [name], [name]: 12 Compression Therapy Performed for Wound Wound #2 Right,Lateral Lower  Leg Assessment: Performed By: Jake Church, RN Compression Type: Three Layer Post Procedure Diagnosis Same as Pre-procedure Electronic Signature(s) Signed: 03/08/2019 7:19:07 AM By: Carlene Coria RN Entered By: Carlene Coria on 03/07/2019 15:55:02 -------------------------------------------------------------------------------- Encounter Discharge Information Details Patient Name: Date of Service: [name] 03/07/2019 2:30 PM Medical Record Number:[id number] Patient Account Number: [id number] Date of Birth/Sex: Treating RN: [birth year] (84 y.o. [name] Primary Care Ahyan Kreeger: Haynes Hoehn Other Clinician: Referring Sissi Padia: Treating Darienne Belleau/Extender:[name], [name], [name]: 12 Encounter Discharge Information Items Discharge Condition: Stable Ambulatory Status: Walker Discharge Destination: Home Transportation: Private Auto Accompanied By: wife Schedule Follow-up Appointment: Yes Clinical Summary of Care: Patient Declined Electronic Signature(s) Signed: 03/09/2019 7:44:20 AM By: Kela Millin Entered By: Kela Millin on 03/07/2019 16:23:08 -------------------------------------------------------------------------------- Multi Wound Chart Details Patient Name: Date of Service: [name] 03/07/2019 2:30 PM Medical Record Number:[id number] Patient Account Number: [id number] Date of Birth/Sex: Treating RN: 24-Apr-1935 (84 y.o. M) Primary Care Julitza Rickles: Haynes Hoehn Other Clinician: Referring Jami Bogdanski: Treating Analy Bassford/Extender:[name], [name], [name]: 12 Vital Signs Height(in): 76 Pulse(bpm): 72 Weight(lbs): 71 Blood Pressure(mmHg): 136/57 Body Mass Index(BMI): 28 Temperature(F): 97.8 Respiratory Respiratory 18 Rate(breaths/min): Photos: [2:No Photos] [4:No Photos] [N/A:N/A] Wound Location: [2:Right Lower Leg - Lateral Left Malleolus - Medial] [N/A:N/A] Wounding Event: [2:Gradually  Appeared] [4:Gradually Appeared] [N/A:N/A] Primary Etiology: [2:Venous Leg Ulcer] [4:Venous Leg Ulcer] [N/A:N/A] Comorbid History: [2:Chronic Obstructive Pulmonary Disease (COPD), Coronary Artery (COPD), Coronary Artery Disease, Hypertension, Peripheral Venous Disease, Peripheral Venous Disease, Received Chemotherapy, Received Chemotherapy, Received Radiation]  [4:Chronic Obstructive Pulmonary Disease Disease, Hypertension, Received Radiation] [N/A:N/A] Date Acquired: [2:03/12/2018] [4:03/12/2018] [N/A:N/A] Weeks of Treatment: [2:12] [4:12] [N/A:N/A] Wound Status: [2:Open] [4:Open] [N/A:N/A] Clustered Wound: [2:Yes] [4:No] [N/A:N/A] Clustered Quantity: [2:2] [4:N/A] [N/A:N/A] Measurements L x W x D 4.8x0.9x0.1 [4:1.2x4.5x0.1] [N/A:N/A] (cm) Area (cm) : [2:3.393] [4:4.241] [N/A:N/A] Volume (cm) : [2:0.339] [4:0.424] [N/A:N/A] % Reduction in Area: [2:99.00%] [4:42.60%] [N/A:N/A] % Reduction in Volume: 99.50% [4:71.30%] [N/A:N/A] Classification: [2:Full Thickness Without Exposed Support Structures Exposed  Support Structures] [4:Full Thickness Without] [N/A:N/A] Exudate Amount: [2:Medium] [4:Medium] [N/A:N/A] Exudate Type: [2:Serosanguineous] [4:Serosanguineous] [N/A:N/A] Exudate Color: [2:red, brown] [4:red, brown] [N/A:N/A] Wound Margin: [2:Distinct, outline attached Distinct, outline attached N/A] Granulation Amount: [2:Large (67-100%)] [4:Medium (34-66%)] [N/A:N/A] Granulation Quality: [2:Red] [4:Pink, Hyper-granulation] [N/A:N/A] Necrotic Amount: [2:Small (1-33%)] [4:Medium (34-66%)] [N/A:N/A] Exposed Structures: [2:Fat Layer (Subcutaneous Fat Layer (Subcutaneous N/A Tissue) Exposed: Yes Fascia: No Tendon: No Muscle: No Joint: No Bone: No] [4:Tissue) Exposed: Yes Fascia: No Tendon: No Muscle: No Joint: No Bone: No] Epithelialization: [2:Small (1-33%)] [4:Small (1-33%) N/A] [N/A:N/A N/A] Treatment Notes Electronic Signature(s) Signed: 03/07/2019 5:32:11 PM By: Linton Ham MD Entered By:  Linton Ham on 03/07/2019 16:04:14 -------------------------------------------------------------------------------- Multi-Disciplinary Care Plan Details Patient Name: Date of Service: [name] 03/07/2019 2:30 PM Medical Record Number:[id number] Patient Account Number: [id number] Date of Birth/Sex: Treating RN: 1935-08-14 (83 y.o. [name]) Carlene Coria Primary Care Ceferino Lang: Haynes Hoehn Other Clinician: Referring Carlie Corpus: Treating Kaula Klenke/Extender:[name], [name], [name]: 12 Active Inactive Wound/Skin Impairment Nursing Diagnoses: Impaired tissue integrity Knowledge deficit related to smoking impact on wound healing Knowledge deficit related to ulceration/compromised skin integrity Goals: Patient/caregiver will verbalize understanding of skin care regimen Date Initiated: 02/23/2019 Target Resolution Date: 03/24/2019 Goal Status: Active Ulcer/skin breakdown will have a volume reduction of 30% by week 4 Target Resolution Date Initiated: 12/12/2018 Date Inactivated: 02/09/2019 Date: 02/03/2019 Goal Status: Met Interventions: Provide education on ulcer and skin care Notes: Electronic Signature(s) Signed: 03/08/2019 7:19:07 AM By: Carlene Coria RN Entered By: Carlene Coria on 03/07/2019 14:04:17 -------------------------------------------------------------------------------- Pain Assessment Details Patient Name: Date of Service: ZIARE, CRYDER 03/07/2019 2:30 PM Medical Record Number:[id number] Patient Account Number: [id number] Date of Birth/Sex: Treating RN: 1935-04-08 (84 y.o. [name] Primary Care Shalandria Elsbernd: Haynes Hoehn Other Clinician: Referring Kiauna Zywicki: Treating Mckensie Scotti/Extender:[name], [name], [name]: 12 Active Problems Location of Pain Severity and Description of Pain Patient Has Paino No Site Locations Pain Management and Medication Current Pain Management: Electronic Signature(s) Signed: 03/13/2019  6:39:44 PM By: Levan Hurst RN, BSN Entered By: Levan Hurst on 03/07/2019 15:11:33 -------------------------------------------------------------------------------- Patient/Caregiver Education Details Patient Name: Date of Service: [name] 1/26/2021andnbsp2:30 PM Medical Record Number:[id number] Patient Account Number: [id number] Date of Birth/Gender: Treating RN: 30-Jul-1935 (83 y.o. [name]) Carlene Coria Primary Care Physician: Haynes Hoehn Other Clinician: Referring Physician: Treating Physician/Extender:[name], [name], Haynes Hoehn in Treatment: 12 Education Assessment Education Provided To: Patient Education Topics Provided Wound/Skin Impairment: Methods: Explain/Verbal Responses: State content correctly Electronic Signature(s) Signed: 03/08/2019 7:19:07 AM By: Carlene Coria RN Entered By: Carlene Coria on 03/07/2019 14:04:33 -------------------------------------------------------------------------------- Wound Assessment Details Patient Name: Date of Service: MARSEL, GAIL 03/07/2019 2:30 PM Medical Record Number:[id number] Patient Account Number: [id number] Date of Birth/Sex: Treating RN: 1936-02-04 (84 y.o. M) Primary Care Analei Whinery: Haynes Hoehn Other Clinician: Referring Earlena Werst: Treating Valari Taylor/Extender:[name], [name], [name]: 12 Wound Status Wound Number: 2 Primary Venous Leg Ulcer Etiology: Wound Location: Right Lower Leg - Lateral Wound Open Wounding Event: Gradually Appeared Status: Date Acquired: 03/12/2018 Comorbid Chronic Obstructive Pulmonary Disease Weeks Of Treatment: 12 History: (COPD), Coronary Artery Disease, Clustered Wound: Yes Hypertension, Peripheral Venous Disease, Received Chemotherapy, Received Radiation Photos Wound Measurements Length: (cm) 4.8 % Reduct Width: (cm) 0.9 % Reduct Depth: (cm) 0.1 Epitheli Clustered Quantity: 2 Tunnelin Area: (cm) 3.393 Undermi Volume: (cm) 0.339 Wound  Description Classification: Full Thickness Without Exposed Support Structures Wound Distinct, outline attached Margin: Exudate Medium Amount: Exudate Serosanguineous Type: Exudate red, brown Color: Wound Bed Granulation Amount: Large (67-100%) Granulation  Quality: Red Necrotic Amount: Small (1-33%) Necrotic Quality: Adherent Slough Foul Odor After Cleansing: No Slough/Fibrino Yes Exposed Structure Fascia Exposed: No Fat Layer (Subcutaneous Tissue) Exposed: Yes Tendon Exposed: No Muscle Exposed: No Joint Exposed: No Bone Exposed: No ion in Area: 99% ion in Volume: 99.5% alization: Small (1-33%) g: No ning: No Treatment Notes Wound #2 (Right, Lateral Lower Leg) 1. Cleanse With Wound Cleanser Soap and water 2. Periwound Care TCA Cream 3. Primary Dressing Applied Foam Hydrofera Blue 4. Secondary Dressing ABD Pad 6. Support Layer Applied 3 layer compression wrap Notes hydrofera blue/abd/3 layer to right leg and foam to left medial malleolar Electronic Signature(s) Signed: 03/08/2019 4:33:34 PM By: Mikeal Hawthorne EMT/HBOT Entered By: Mikeal Hawthorne on 03/08/2019 15:39:37 -------------------------------------------------------------------------------- Wound Assessment Details Patient Name: Date of Service: [name] 03/07/2019 2:30 PM Medical Record Number:[id number] Patient Account Number: [id number] Date of Birth/Sex: Treating RN: Jun 24, 1935 (84 y.o. M) Primary Care Darielys Giglia: Haynes Hoehn Other Clinician: Referring Shalonda Sachse: Treating Ayn Domangue/Extender:[name], [name], [name]: 12 Wound Status Wound Number: 4 Primary Venous Leg Ulcer Etiology: Wound Location: Left Malleolus - Medial Wound Open Wounding Event: Gradually Appeared Status: Date Acquired: 03/12/2018 Comorbid Chronic Obstructive Pulmonary Disease Weeks Of Treatment: 12 History: (COPD), Coronary Artery Disease, Clustered Wound: No Hypertension, Peripheral Venous  Disease, Received Chemotherapy, Received Radiation Photos Wound Measurements Length: (cm) 1.2 % Re Width: (cm) 4.5 % Re Depth: (cm) 0.1 Epit Area: (cm) 4.241 Tun Volume: (cm) 0.424 Wound Description Full Thickness Without Exposed Support Classification: Structures Wound Distinct, outline attached Margin: Exudate Medium Amount: Exudate Serosanguineous Type: Exudate red, brown Color: Wound Bed Granulation Amount: Medium (34-66%) Granulation Quality: Pink, Hyper-granulation Necrotic Amount: Medium (34-66%) Necrotic Quality: Adherent Slough Foul Odor After Cleansing: No Slough/Fibrino Yes Exposed Structure Fascia Exposed: No Fat Layer (Subcutaneous Tissue) Exposed: Yes Tendon Exposed: No Muscle Exposed: No Joint Exposed: No Bone Exposed: No duction in Area: 42.6% duction in Volume: 71.3% helialization: Small (1-33%) neling: No Treatment Notes Wound #4 (Left, Medial Malleolus) 1. Cleanse With Wound Cleanser Soap and water 2. Periwound Care TCA Cream 3. Primary Dressing Applied Foam Hydrofera Blue 4. Secondary Dressing ABD Pad 6. Support Layer Applied 3 layer compression wrap Notes hydrofera blue/abd/3 layer to right leg and foam to left medial malleolar Electronic Signature(s) Signed: 03/08/2019 4:33:34 PM By: Mikeal Hawthorne EMT/HBOT Entered By: Mikeal Hawthorne on 03/08/2019 15:38:46 -------------------------------------------------------------------------------- Vitals Details Patient Name: Date of Service: [name] 03/07/2019 2:30 PM Medical Record Number:[id number] Patient Account Number: [id number] Date of Birth/Sex: Treating RN: 1935-05-02 (84 y.o. [name] Primary Care Vale Mousseau: Haynes Hoehn Other Clinician: Referring Lerline Valdivia: Treating Gussie Towson/Extender:[name], [name], [name]: 12 Vital Signs Time Taken: 15:10 Temperature (F): 97.8 Height (in): 76 Pulse (bpm): 72 Weight (lbs): 227 Respiratory Rate  (breaths/min): 18 Body Mass Index (BMI): 27.6 Blood Pressure (mmHg): 136/57 Reference Range: 80 - 120 mg / dl Electronic Signature(s) Signed: 03/13/2019 6:39:44 PM By: Levan Hurst RN, BSN Entered By: Levan Hurst on 03/07/2019 15:11:24

## 2019-03-21 ENCOUNTER — Other Ambulatory Visit: Payer: Self-pay

## 2019-03-21 ENCOUNTER — Encounter (HOSPITAL_BASED_OUTPATIENT_CLINIC_OR_DEPARTMENT_OTHER): Payer: Medicare Other | Attending: Internal Medicine

## 2019-03-21 ENCOUNTER — Other Ambulatory Visit (HOSPITAL_COMMUNITY)
Admission: RE | Admit: 2019-03-21 | Discharge: 2019-03-21 | Disposition: A | Discharge status: Home / Self Care | Payer: Medicare Other | Source: Ambulatory Visit | Attending: Pulmonary Disease | Admitting: Pulmonary Disease

## 2019-03-21 DIAGNOSIS — I890 Lymphedema, not elsewhere classified: Secondary | ICD-10-CM | POA: Insufficient documentation

## 2019-03-21 DIAGNOSIS — L97329 Non-pressure chronic ulcer of left ankle with unspecified severity: Secondary | ICD-10-CM | POA: Diagnosis not present

## 2019-03-21 DIAGNOSIS — I872 Venous insufficiency (chronic) (peripheral): Secondary | ICD-10-CM | POA: Diagnosis not present

## 2019-03-21 DIAGNOSIS — L97519 Non-pressure chronic ulcer of other part of right foot with unspecified severity: Secondary | ICD-10-CM | POA: Diagnosis not present

## 2019-03-21 DIAGNOSIS — J449 Chronic obstructive pulmonary disease, unspecified: Secondary | ICD-10-CM | POA: Diagnosis not present

## 2019-03-21 DIAGNOSIS — I714 Abdominal aortic aneurysm, without rupture: Secondary | ICD-10-CM | POA: Diagnosis not present

## 2019-03-21 DIAGNOSIS — I2510 Atherosclerotic heart disease of native coronary artery without angina pectoris: Secondary | ICD-10-CM | POA: Insufficient documentation

## 2019-03-21 DIAGNOSIS — R21 Rash and other nonspecific skin eruption: Secondary | ICD-10-CM | POA: Diagnosis not present

## 2019-03-21 DIAGNOSIS — I87333 Chronic venous hypertension (idiopathic) with ulcer and inflammation of bilateral lower extremity: Secondary | ICD-10-CM | POA: Insufficient documentation

## 2019-03-21 DIAGNOSIS — Z86718 Personal history of other venous thrombosis and embolism: Secondary | ICD-10-CM | POA: Diagnosis not present

## 2019-03-21 DIAGNOSIS — Z7901 Long term (current) use of anticoagulants: Secondary | ICD-10-CM | POA: Insufficient documentation

## 2019-03-21 DIAGNOSIS — Z20822 Contact with and (suspected) exposure to covid-19: Secondary | ICD-10-CM | POA: Diagnosis present

## 2019-03-21 DIAGNOSIS — L97819 Non-pressure chronic ulcer of other part of right lower leg with unspecified severity: Secondary | ICD-10-CM | POA: Insufficient documentation

## 2019-03-21 DIAGNOSIS — N400 Enlarged prostate without lower urinary tract symptoms: Secondary | ICD-10-CM | POA: Insufficient documentation

## 2019-03-21 DIAGNOSIS — Z85038 Personal history of other malignant neoplasm of large intestine: Secondary | ICD-10-CM | POA: Insufficient documentation

## 2019-03-21 DIAGNOSIS — Z955 Presence of coronary angioplasty implant and graft: Secondary | ICD-10-CM | POA: Diagnosis not present

## 2019-03-21 LAB — SARS CORONAVIRUS 2 (TAT 6-24 HRS): SARS Coronavirus 2: NEGATIVE

## 2019-03-21 NOTE — Progress Notes (Signed)
[name], [name] ([id number]) Visit Report for 03/21/2019 SuperBill Details Patient Name: Date of Service: [name], [name] 03/21/2019 Medical Record Number:[id number] Patient Account Number: [id number] Date of Birth/Sex: Treating RN: 07/18/1935 (83 y.o. 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

## 2019-03-21 NOTE — Progress Notes (Signed)
[name], [name] ([id number]) Visit Report for 03/21/2019 Arrival Information Details Patient Name: Date of Service: [name], [name] 03/21/2019 10:30 AM Medical Record Number:[id number] Patient Account Number: [id number] Date of Birth/Sex: Treating RN: Oct 18, 1935 (84 y.o. [name] Primary Care Song Myre: Haynes Hoehn Other Clinician: Referring Ishia Tenorio: Treating Cadance Raus/Extender:[name], [name], [name]: 14 Visit Information History Since Last Visit Added or deleted any medications: No Patient Arrived: Wheel Chair Any new allergies or adverse reactions: No Arrival Time: 12:00 Had a fall or experienced change in No activities of daily living that may affect Accompanied By: wife risk of falls: Transfer Assistance: None Signs or symptoms of abuse/neglect since last No Patient Identification Verified: Yes visito Secondary Verification Process Yes Hospitalized since last visit: No Completed: Implantable device outside of the clinic excluding No Patient Requires Transmission-Based No cellular tissue based products placed in the center Precautions: since last visit: Patient Has Alerts: No Has Dressing in Place as Prescribed: Yes Has Compression in Place as Prescribed: Yes Pain Present Now: No Electronic Signature(s) Signed: 03/21/2019 5:30:42 PM By: Levan Hurst RN, BSN Entered By: Levan Hurst on 03/21/2019 13:47:58 -------------------------------------------------------------------------------- Compression Therapy Details Patient Name: Date of Service: [name] 03/21/2019 10:30 AM Medical Record Number:[id number] Patient Account Number: [id number] Date of Birth/Sex: Treating RN: Dec 29, 1935 (84 y.o. [name] Primary Care Codylee Patil: Haynes Hoehn Other Clinician: Referring Efrata Brunner: Treating Tishara Pizano/Extender:[name], [name], [name]: 14 Compression Therapy Performed for Wound Wound #2 Right,Lateral Lower  Leg Assessment: Performed By: Clinician Levan Hurst, RN Compression Type: Three Hydrologist) Signed: 03/21/2019 5:30:42 PM By: Levan Hurst RN, BSN Entered By: Levan Hurst on 03/21/2019 13:49:37 -------------------------------------------------------------------------------- Encounter Discharge Information Details Patient Name: Date of Service: [name]. 03/21/2019 10:30 AM Medical Record Number:[id number] Patient Account Number: [id number] Date of Birth/Sex: Treating RN: Feb 10, 1935 (84 y.o. [name] Primary Care Quina Wilbourne: Haynes Hoehn Other Clinician: Referring Sarina Robleto: Treating Koleen Celia/Extender:[name], [name], [name]: 14 Encounter Discharge Information Items Discharge Condition: Stable Ambulatory Status: Wheelchair Discharge Destination: Home Transportation: Private Auto Accompanied By: wife Schedule Follow-up Appointment: Yes Clinical Summary of Care: Patient Declined Electronic Signature(s) Signed: 03/21/2019 5:30:42 PM By: Levan Hurst RN, BSN Entered By: Levan Hurst on 03/21/2019 13:51:21 -------------------------------------------------------------------------------- Wound Assessment Details Patient Name: Date of Service: [name]. 03/21/2019 10:30 AM Medical Record Number:[id number] Patient Account Number: [id number] Date of Birth/Sex: Treating RN: 1935/03/03 (84 y.o. [name] Primary Care Asako Saliba: Haynes Hoehn Other Clinician: Referring Franciszek Platten: Treating Donnis Pecha/Extender:[name], [name], [name]: 14 Wound Status Wound Number: 2 Primary Venous Leg Ulcer Etiology: Wound Location: Right Lower Leg - Lateral Wound Open Wounding Event: Gradually Appeared Status: Date Acquired: 03/12/2018 Comorbid Chronic Obstructive Pulmonary Disease Weeks Of Treatment: 14 History: (COPD), Coronary Artery Disease, Clustered Wound: Yes Hypertension, Peripheral Venous  Disease, Received Chemotherapy, Received Radiation Wound Measurements Length: (cm) 4.8 % Reduct Width: (cm) 0.9 % Reduct Depth: (cm) 0.1 Epitheli Clustered Quantity: 2 Tunnelin Area: (cm) 3.393 Undermi Volume: (cm) 0.339 Wound Description Full Thickness Without Exposed Support Foul Od Classification: Structures Slough/ Wound Distinct, outline attached Margin: Exudate Medium Amount: Exudate Serosanguineous Type: Exudate red, brown Color: Wound Bed Granulation Amount: Large (67-100%) Granulation Quality: Red Fascia Necrotic Amount: Small (1-33%) Fat Lay Necrotic Quality: Adherent Slough Tendon Muscle Joint E Bone Ex or After Cleansing: No Fibrino Yes Exposed Structure Exposed: No er (Subcutaneous Tissue) Exposed: Yes Exposed: No Exposed: No xposed: No posed: No ion in Area: 99% ion in Volume: 99.5%  alization: Small (1-33%) g: No ning: No Treatment Notes Wound #2 (Right, Lateral Lower Leg) 1. Cleanse With Soap and water 3. Primary Dressing Applied Hydrofera Blue 4. Secondary Dressing Dry Gauze 6. Support Layer Applied 3 layer compression Water quality scientist) Signed: 03/21/2019 5:30:42 PM By: Levan Hurst RN, BSN Entered By: Levan Hurst on 03/21/2019 13:48:45 -------------------------------------------------------------------------------- Wound Assessment Details Patient Name: Date of Service: [name], [name] 03/21/2019 10:30 AM Medical Record Number:[id number] Patient Account Number: [id number] Date of Birth/Sex: Treating RN: Nov 17, 1935 (84 y.o. [name] Primary Care Amarianna Abplanalp: Haynes Hoehn Other Clinician: Referring Seiya Silsby: Treating Marcha Licklider/Extender:[name], [name], [name]: 14 Wound Status Wound Number: 4 Primary Venous Leg Ulcer Etiology: Wound Location: Left Malleolus - Medial Wound Open Wounding Event: Gradually Appeared Status: Date Acquired: 03/12/2018 Comorbid Chronic Obstructive Pulmonary  Disease Weeks Of Treatment: 14 History: (COPD), Coronary Artery Disease, Clustered Wound: No Hypertension, Peripheral Venous Disease, Received Chemotherapy, Received Radiation Wound Measurements Length: (cm) 1.2 % Reduct Width: (cm) 4.5 % Reduct Depth: (cm) 0.1 Epitheli Area: (cm) 4.241 Tunneli Volume: (cm) 0.424 Undermi Wound Description Full Thickness Without Exposed Support Foul Od Classification: Structures Slough/ Wound Distinct, outline attached Margin: Exudate Medium Amount: Exudate Serosanguineous Type: Exudate red, brown Color: Wound Bed Granulation Amount: Medium (34-66%) Granulation Quality: Pink, Hyper-granulation Fascia E Necrotic Amount: Medium (34-66%) Fat Laye Necrotic Quality: Adherent Slough Tendon E Muscle E Joint Ex Bone Exp or After Cleansing: No Fibrino Yes Exposed Structure xposed: No r (Subcutaneous Tissue) Exposed: Yes xposed: No xposed: No posed: No osed: No ion in Area: 42.6% ion in Volume: 71.3% alization: Small (1-33%) ng: No ning: No Treatment Notes Wound #4 (Left, Medial Malleolus) 1. Cleanse With Wound Cleanser 4. Secondary Dressing Foam Electronic Signature(s) Signed: 03/21/2019 5:30:42 PM By: Levan Hurst RN, BSN Entered By: Levan Hurst on 03/21/2019 13:48:51 -------------------------------------------------------------------------------- [place] Details Patient Name: Date of Service: Madore, Rashee G. 03/21/2019 10:30 AM Medical Record Number:[id number] Patient Account Number: [id number] Date of Birth/Sex: Treating RN: [birth year] (84 y.o. [name] Primary Care Venise Ellingwood: Haynes Hoehn Other Clinician: Referring Krystian Ferrentino: Treating Jacon Whetzel/Extender:[name], [name], [name]: 14 Vital Signs Time Taken: 12:02 Temperature (F): 97.6 Height (in): 76 Pulse (bpm): 68 Weight (lbs): 227 Respiratory Rate (breaths/min): 18 Body Mass Index (BMI): 27.6 Blood Pressure (mmHg):  142/60 Reference Range: 80 - 120 mg / dl Electronic Signature(s) Signed: 03/21/2019 5:30:42 PM By: Levan Hurst RN, BSN Entered By: Levan Hurst on 03/21/2019 13:48:12

## 2019-03-22 NOTE — Progress Notes (Signed)
@Patient  ID: [name], male    DOB: 09/17/1935, 84 y.o.   MRN: [id number]  Chief Complaint  Patient presents with  . Follow-up    F/U after PFT. States his breathing has been ok since last visit.     Referring provider: Renne Crigler, NP  HPI:  84 year old male former smoker initially referred to our office on 01/27/2019.  This is status post a hospitalization October/2020 for lower extremity cellulitis, lower leg DVT, abnormal CT scan that showed a consolidation as well as a nodule.  PMH: Hypertension, dyslipidemia, CAD, arthritis, venous stasis, leg wounds, history of DVT (October/2020) Smoker/ Smoking History: Former smoker.  Quit 2006.  171-pack-year smoking history. Maintenance: Dulera 200 Pt of: Dr. Vaughan Browner  Pets: 3 dogs, cat.  No birds, farm animals Occupation: Retired Insurance account manager Exposures: Significant exposure to asbestos.  No mold, hot tub, Jacuzzi or down pillows or comforters Smoking history: 120-pack-year smoker.  Quit in 2005 Travel history: Originally lived in [place] and [place].  No significant recent travel Relevant family history: He was adopted and does not know of any family history  03/23/2019  - Visit   84 year old male former smoker followed in our office for suspected COPD, asbestos exposure and DVT. Patient was hospitalized in October/2020 for lower extremity cellulitis. Abnormal CT scan after was suggestive of pneumonia. Patient was also noted to have a left lower extremity DVT and patient was on anticoagulation for this. Patient with history of elevated eosinophil counts. Currently maintained on Dulera 200. Plan was to repeat CT chest in 6 months to follow nodule.  Patient completing pulmonary function test with our office today:  03/23/2019-pulmonary function test-FVC 2.92 (60% predicted), postbronchodilator ratio 63, postbronchodilator FEV1 1.96 (56% predicted), no bronchodilator response, DLCO 18.68 (66% predicted)  Overall patient feels  that he has been doing well since last being seen.  He continues to use his Dulera.  He continues to work with wound care regarding his lower extremity wounds.  They are down to 1 wound that they are maintaining.  He has upcoming follow-up with vascular who has been managing his Eliquis.  Vascular's office visit is in March/2021 with Dr. Donzetta Matters.   Patient does continue to have a baseline cough.  He does have congestion.  Unfortunately does not know what color his mucus is as he swallows it.  Questionaires / Pulmonary Flowsheets:   MMRC: mMRC Dyspnea Scale mMRC Score  03/23/2019 0    Tests:   Imaging: CT abdomen pelvis 04/09/2017-visualized lung bases do not show any abnormality  CT chest 11/19/2018-no PE, patchy airspace disease in the right lower lobe.  1.6 cm right upper lobe subsolid nodule, coronary, aortic atherosclerosis I have reviewed the images personally.  Labs: CBC 11/14/2018-WBC 11.7, eos 7%, absolute eosinophil count 819  03/23/2019-pulmonary function test-FVC 2.92 (60% predicted), postbronchodilator ratio 63, postbronchodilator FEV1 1.96 (56% predicted), no bronchodilator response, DLCO 18.68 (66% predicted)  FENO:  No results found for: NITRICOXIDE  PFT: PFT Results Latest Ref Rng & Units 03/23/2019  FVC-Pre L 2.92  FVC-Predicted Pre % 60  FVC-Post L 3.09  FVC-Predicted Post % 63  Pre FEV1/FVC % % 69  Post FEV1/FCV % % 63  FEV1-Pre L 2.01  FEV1-Predicted Pre % 57  FEV1-Post L 1.96  DLCO UNC% % 66  DLCO COR %Predicted % 92  TLC L 6.32  TLC % Predicted % 78  RV % Predicted % 113    WALK:  SIX MIN WALK 01/27/2019  Supplimental Oxygen  during Test? (L/min) Yes  O2 Flow Rate 2  Type Pulse  Tech Comments: Pt. walked at a slow pace, dropped to 88 and was given 2L pulse and recovered. ER    Imaging: No results found.  Lab Results:  CBC    Component Value Date/Time   WBC 12.6 (H) 11/20/2018 0753   RBC 3.83 (L) 11/20/2018 0753   HGB 12.2 (L) 01/09/2019 1133    HCT 36.0 (L) 01/09/2019 1133   PLT 552 (H) 11/20/2018 0753   MCV 93.5 11/20/2018 0753   MCH 29.8 11/20/2018 0753   MCHC 31.8 11/20/2018 0753   RDW 13.1 11/20/2018 0753   LYMPHSABS 0.5 (L) 11/20/2018 0753   MONOABS 0.5 11/20/2018 0753   EOSABS 0.0 11/20/2018 0753   BASOSABS 0.0 11/20/2018 0753    BMET    Component Value Date/Time   NA 139 01/09/2019 1133   K 4.3 01/09/2019 1133   CL 100 01/09/2019 1133   CO2 31 11/20/2018 0753   GLUCOSE 97 01/09/2019 1133   BUN 16 01/09/2019 1133   CREATININE 0.80 01/09/2019 1133   CREATININE 0.69 06/09/2012 0946   CALCIUM 8.5 (L) 11/20/2018 0753   GFRNONAA >60 11/20/2018 0753   GFRNONAA >89 06/09/2012 0946   GFRAA >60 11/20/2018 0753   GFRAA >89 06/09/2012 0946    BNP No results found for: BNP  ProBNP No results found for: PROBNP  Specialty Problems      Pulmonary Problems   COPD mixed type ([place])    03/23/2019-pulmonary function test-FVC 2.92 (60% predicted), postbronchodilator ratio 63, postbronchodilator FEV1 1.96 (56% predicted), no bronchodilator response, DLCO 18.68 (66% predicted)      Dyspnea   Lung nodule      Allergies  Allergen Reactions  . Sulfa Antibiotics Other (See Comments)    Didn't feel right    Immunization History  Administered Date(s) Administered  . DTaP 07/12/2009  . Influenza Whole 11/09/2008  . Influenza, High Dose Seasonal PF 01/22/2016, 11/12/2016, 12/25/2017, 10/11/2018  . Influenza,inj,quad, With Preservative 10/11/2018  . Pneumococcal Conjugate-13 11/10/2007, 05/25/2016  . Pneumococcal Polysaccharide-23 05/25/2017  . Tdap 05/13/2009  . Zoster 05/13/2009    Past Medical History:  Diagnosis Date  . Arthritis   . CAD (coronary artery disease)    Abnormal stress perfusion study with apical and inferior wall ischemia. LAD had a long 99% stenosis followed by 30% stenosis, first diagonal 25% stenosis, circumflex luminal irregularities, second obtuse marginal 25% stenosis, RCA 40% followed  by 40% stenosis. EF 60%. Stenting with 2 bare metal stents by Dr. Burt Knack to the LAD.  Marland Kitchen Colon cancer [facility])    Radiation, chemo, surgery 2011  . Complication of anesthesia    Pt states he coughs alot during anesthesia  . COPD (chronic obstructive pulmonary disease) ([place])   . History of tobacco use   . Hypertension     Tobacco History: Social History   Tobacco Use  Smoking Status Former Smoker  . Packs/day: 3.00  . Years: 57.00  . Pack years: 171.00  . Quit date: 2006  . Years since quitting: 15.1  Smokeless Tobacco Never Used  Tobacco Comment   quit smoking 2006   Counseling given: Yes Comment: quit smoking 2006  Continue to not smoke  Outpatient Encounter Medications as of 03/23/2019  Medication Sig  . acetaminophen (TYLENOL) 325 MG tablet Take 650 mg by mouth every 6 (six) hours as needed.  Marland Kitchen albuterol (VENTOLIN HFA) 108 (90 Base) MCG/ACT inhaler Inhale 2 puffs into the lungs every  6 (six) hours as needed for wheezing or shortness of breath.  Marland Kitchen amLODipine-benazepril (LOTREL) 10-40 MG per capsule Take 1 capsule by mouth daily. (Patient taking differently: Take 1 capsule by mouth at bedtime. )  . aspirin 81 MG chewable tablet Chew 81 mg by mouth every evening.  Marland Kitchen atorvastatin (LIPITOR) 20 MG tablet Take 1 tablet (20 mg total) by mouth every evening.  Marland Kitchen ELIQUIS 5 MG TABS tablet TAKE 2 TABLETS BY MOUTH TWICE DAILY FOR 7 DAYS, THEN 1 TABLET TWICE DAILY  . Ferrous Gluconate (IRON) 240 (27 FE) MG TABS Take 240 mg by mouth daily.   Marland Kitchen guaifenesin (ROBITUSSIN) 100 MG/5ML syrup Take 200 mg by mouth 3 (three) times daily as needed for cough.  . Influenza Vac High-Dose Quad (FLUZONE HIGH-DOSE QUADRIVALENT) 0.7 ML SUSY Fluzone High-Dose Quad 2020-21 (PF) 240 mcg/0.7 mL IM syringe  PHARMACIST ADMINISTERED IMMUNIZATION ADMINISTERED AT TIME OF DISPENSING  . Lycopene 10 MG CAPS Take 10 mg by mouth daily.  . metoprolol tartrate (LOPRESSOR) 100 MG tablet Take 1 tablet (100 mg total) by mouth at  bedtime.  . mometasone-formoterol (DULERA) 100-5 MCG/ACT AERO Inhale 2 puffs into the lungs 2 (two) times daily.  . nitroGLYCERIN (NITROSTAT) 0.4 MG SL tablet Place 1 tablet (0.4 mg total) under the tongue every 5 (five) minutes as needed. (Patient taking differently: Place 0.4 mg under the tongue every 5 (five) minutes as needed for chest pain. )  . Vitamin D, Cholecalciferol, 1000 UNITS TABS Take 1,000 Units by mouth 2 (two) times daily.  . Budeson-Glycopyrrol-Formoterol (BREZTRI AEROSPHERE) 160-9-4.8 MCG/ACT AERO Inhale 2 puffs into the lungs 2 (two) times daily.   No facility-administered encounter medications on file as of 03/23/2019.     Review of Systems  Review of Systems  Constitutional: Positive for fatigue. Negative for activity change, chills, fever and unexpected weight change.  HENT: Negative for postnasal drip, rhinorrhea, sinus pressure, sinus pain and sore throat.   Eyes: Negative.   Respiratory: Positive for cough and shortness of breath. Negative for wheezing.   Cardiovascular: Negative for chest pain, palpitations and leg swelling.  Gastrointestinal: Negative for constipation, diarrhea, nausea and vomiting.  Endocrine: Negative.   Genitourinary: Negative.   Musculoskeletal: Negative.   Skin:       Working with wound care, lower extremity wound  Neurological: Negative for dizziness and headaches.  Psychiatric/Behavioral: Negative.  Negative for dysphoric mood. The patient is not nervous/anxious.   All other systems reviewed and are negative.    Physical Exam  BP 124/72 (BP Location: Right Arm, Patient Position: Sitting, Cuff Size: Normal)   Pulse 64   Temp (!) 97 F (36.1 C) (Temporal)   Ht 6\' 3"  (1.905 m)   Wt 256 lb (116.1 kg)   SpO2 93%   BMI 32.00 kg/m   Wt Readings from Last 5 Encounters:  03/23/19 256 lb (116.1 kg)  01/27/19 243 lb (110.2 kg)  01/09/19 240 lb (108.9 kg)  01/02/19 246 lb (111.6 kg)  11/16/18 246 lb 14.6 oz (112 kg)    BMI  Readings from Last 5 Encounters:  03/23/19 32.00 kg/m  01/27/19 29.58 kg/m  01/09/19 29.21 kg/m  01/02/19 29.94 kg/m  11/16/18 30.06 kg/m     Physical Exam Vitals reviewed.  Constitutional:      General: He is not in acute distress.    Appearance: Normal appearance. He is obese.  HENT:     Head: Normocephalic and atraumatic.     Right Ear: Hearing, tympanic membrane,  ear canal and external ear normal.     Left Ear: Hearing, tympanic membrane, ear canal and external ear normal. There is no impacted cerumen.     Nose: No mucosal edema.     Right Turbinates: Not enlarged.     Left Turbinates: Not enlarged.     Mouth/Throat:     Mouth: Mucous membranes are dry.     Pharynx: Oropharynx is clear. No oropharyngeal exudate.  Eyes:     Pupils: Pupils are equal, round, and reactive to light.  Cardiovascular:     Rate and Rhythm: Normal rate and regular rhythm.     Pulses: Normal pulses.     Heart sounds: Normal heart sounds. No murmur.  Pulmonary:     Effort: Pulmonary effort is normal.     Breath sounds: No decreased breath sounds, wheezing or rales.     Comments: Diminished breath sounds throughout exam Musculoskeletal:     Cervical back: Normal range of motion.     Right lower leg: No edema.     Left lower leg: No edema.  Lymphadenopathy:     Cervical: No cervical adenopathy.  Skin:    Capillary Refill: Capillary refill takes less than 2 seconds.     Comments: Currently working with wound care regarding lower extremity wounds  Neurological:     General: No focal deficit present.     Mental Status: He is alert and oriented to person, place, and time.     Motor: No weakness.     Coordination: Coordination normal.     Gait: Gait abnormal.  Psychiatric:        Mood and Affect: Mood normal.        Behavior: Behavior normal. Behavior is cooperative.        Thought Content: Thought content normal.        Judgment: Judgment normal.       Assessment & Plan:   DVT  (deep venous thrombosis) (HCC) Plan: Continue to follow-up with vascular Continue Eliquis   COPD mixed type ([place]) Reviewed PFTs with patient and wife today Significant smoking history Elevated eosinophil counts in the past  Likely this is more asthma COPD overlap syndrome.  Plan: Trial of Breztri inhaler today 2-week follow-up with the clinical pharmacy team to check status of inhaler trial Hold Dulera 200 at this point Slowly work on increasing your overall physical activity Follow-up in 2 months   Venous stasis ulcer ([place]) Plan: Continue to work with wound therapy Continue work with vein vascular  Abnormal findings on diagnostic imaging of lung Plan: Complete planned high-resolution CT chest in June/2021 as ordered by Dr. Vaughan Browner  Dyspnea Plan: Trial of Breztri today   Former smoker Plan: Complete high-resolution CT chest is ordered in June/2021 Continue to not smoke  Lung nodule Plan: Complete high-resolution CT chest as ordered in June/2021    Return in about 2 months (around 05/21/2019), or if symptoms worsen or fail to improve, for Follow up with Dr. Vaughan Browner, Follow up with Wyn Quaker FNP-C.   Lauraine Rinne, NP 03/23/2019   This appointment required 62 minutes of patient care (this includes precharting, chart review, review of results, face-to-face care, etc.).

## 2019-03-23 ENCOUNTER — Ambulatory Visit (INDEPENDENT_AMBULATORY_CARE_PROVIDER_SITE_OTHER): Payer: Medicare Other | Admitting: Pulmonary Disease

## 2019-03-23 ENCOUNTER — Other Ambulatory Visit: Payer: Self-pay

## 2019-03-23 ENCOUNTER — Ambulatory Visit: Payer: Medicare Other | Admitting: Pulmonary Disease

## 2019-03-23 ENCOUNTER — Encounter: Payer: Self-pay | Admitting: Pulmonary Disease

## 2019-03-23 VITALS — BP 124/72 | HR 64 | Temp 97.0°F | Ht 75.0 in | Wt 256.0 lb

## 2019-03-23 DIAGNOSIS — R918 Other nonspecific abnormal finding of lung field: Secondary | ICD-10-CM | POA: Diagnosis not present

## 2019-03-23 DIAGNOSIS — I83002 Varicose veins of unspecified lower extremity with ulcer of calf: Secondary | ICD-10-CM

## 2019-03-23 DIAGNOSIS — I824Y2 Acute embolism and thrombosis of unspecified deep veins of left proximal lower extremity: Secondary | ICD-10-CM

## 2019-03-23 DIAGNOSIS — L97202 Non-pressure chronic ulcer of unspecified calf with fat layer exposed: Secondary | ICD-10-CM

## 2019-03-23 DIAGNOSIS — R0600 Dyspnea, unspecified: Secondary | ICD-10-CM

## 2019-03-23 DIAGNOSIS — I82409 Acute embolism and thrombosis of unspecified deep veins of unspecified lower extremity: Secondary | ICD-10-CM | POA: Insufficient documentation

## 2019-03-23 DIAGNOSIS — Z87891 Personal history of nicotine dependence: Secondary | ICD-10-CM | POA: Insufficient documentation

## 2019-03-23 DIAGNOSIS — R911 Solitary pulmonary nodule: Secondary | ICD-10-CM

## 2019-03-23 DIAGNOSIS — J449 Chronic obstructive pulmonary disease, unspecified: Secondary | ICD-10-CM

## 2019-03-23 LAB — PULMONARY FUNCTION TEST
DL/VA % pred: 92 %
DL/VA: 3.5 ml/min/mmHg/L
DLCO unc % pred: 66 %
DLCO unc: 18.68 ml/min/mmHg
FEF 25-75 Post: 1.07 L/sec
FEF 25-75 Pre: 1.22 L/sec
FEF2575-%Change-Post: -12 %
FEF2575-%Pred-Post: 44 %
FEF2575-%Pred-Pre: 50 %
FEV1-%Change-Post: -2 %
FEV1-%Pred-Post: 56 %
FEV1-%Pred-Pre: 57 %
FEV1-Post: 1.96 L
FEV1-Pre: 2.01 L
FEV1FVC-%Change-Post: -7 %
FEV1FVC-%Pred-Pre: 96 %
FEV6-%Change-Post: 5 %
FEV6-%Pred-Post: 65 %
FEV6-%Pred-Pre: 62 %
FEV6-Post: 3 L
FEV6-Pre: 2.84 L
FEV6FVC-%Change-Post: -1 %
FEV6FVC-%Pred-Post: 103 %
FEV6FVC-%Pred-Pre: 105 %
FVC-%Change-Post: 5 %
FVC-%Pred-Post: 63 %
FVC-%Pred-Pre: 60 %
FVC-Post: 3.09 L
FVC-Pre: 2.92 L
Post FEV1/FVC ratio: 63 %
Post FEV6/FVC ratio: 97 %
Pre FEV1/FVC ratio: 69 %
Pre FEV6/FVC Ratio: 99 %
RV % pred: 113 %
RV: 3.35 L
TLC % pred: 78 %
TLC: 6.32 L

## 2019-03-23 MED ORDER — BREZTRI AEROSPHERE 160-9-4.8 MCG/ACT IN AERO: 2.0000 | INHALATION_SPRAY | Freq: Two times a day (BID) | RESPIRATORY_TRACT | 0 refills | Status: DC

## 2019-03-23 NOTE — Assessment & Plan Note (Signed)
Plan: Complete high-resolution CT chest is ordered in June/2021 Continue to not smoke

## 2019-03-23 NOTE — Patient Instructions (Addendum)
You were seen today by Lauraine Rinne, NP  for:   1. COPD mixed type ([place]) 2. Dyspnea, unspecified type  Trial of Breztri >>> 2 puffs in the morning right when you wake up, rinse out your mouth after use, 12 hours later 2 puffs, rinse after use >>> Take this daily, no matter what >>> This is not a rescue inhaler   Follow-up with our office in 2 weeks with a clinical pharmacy team visit to see how you are doing on this inhaler  Do not take your [facility] when taking this inhaler  If you are having issues using this inhaler please contact our office and we can discuss transitioning back to [facility] if needed  Note your daily symptoms > remember "red flags" for COPD:   >>>Increase in cough >>>increase in sputum production >>>increase in shortness of breath or activity  intolerance.   If you notice these symptoms, please call the office to be seen.    Please present to our office in 2 weeks for an appointment with the clinical pharmacy team for:  Marland Kitchen Medication Management  . Medication reconciliation  . Medication Access  . Inhaler teaching    3. Abnormal findings on diagnostic imaging of lung 4. Lung nodule  Complete follow-up with high-resolution CT chest as ordered in June/2021  5. Former smoker  Continue to not smoke  6. Venous stasis ulcer of calf with fat layer exposed with varicose veins, unspecified laterality ([place]) 7. Acute deep vein thrombosis (DVT) of proximal vein of left lower extremity (HCC)  Continue Eliquis  Continue to follow-up with wound care  Continue follow-up with vein and vascular in March/2021  Follow Up:    Return in about 2 months (around 05/21/2019), or if symptoms worsen or fail to improve, for Follow up with Dr. Vaughan Browner, Follow up with Wyn Quaker FNP-C.  Please present to our office in 2 weeks for an appointment with the clinical pharmacy team for:  Marland Kitchen Medication Management  . Medication reconciliation  . Medication Access  . Inhaler teaching     Please do your part to reduce the spread of COVID-19:      Reduce your risk of any infection  and COVID19 by using the similar precautions used for avoiding the common cold or flu:  Marland Kitchen Wash your hands often with soap and warm water for at least 20 seconds.  If soap and water are not readily available, use an alcohol-based hand sanitizer with at least 60% alcohol.  . If coughing or sneezing, cover your mouth and nose by coughing or sneezing into the elbow areas of your shirt or coat, into a tissue or into your sleeve (not your hands). [name] A MASK when in public  . Avoid shaking hands with others and consider head nods or verbal greetings only. . Avoid touching your eyes, nose, or mouth with unwashed hands.  . Avoid close contact with people who are sick. . Avoid places or events with large numbers of people in one location, like concerts or sporting events. . If you have some symptoms but not all symptoms, continue to monitor at home and seek medical attention if your symptoms worsen. . If you are having a medical emergency, call 911.   [place] / e-Visit: [email or web address]         [facility]: [facility]: [phone]  [facility]: [phone]     It is flu season:   >>> Best ways to protect herself from the flu: Receive the yearly flu vaccine, practice good hand hygiene washing with soap and also using hand sanitizer when available, eat a nutritious meals, get adequate rest, hydrate appropriately   Please contact the office if your symptoms worsen or you have concerns that you are not improving.   Thank you for choosing [facility] for your healthcare, and for allowing [place] to partner with you on your healthcare journey. I am thankful to be able to provide care to you today.   Wyn Quaker  FNP-C

## 2019-03-23 NOTE — Progress Notes (Signed)
Full PFT performed today. °

## 2019-03-23 NOTE — Assessment & Plan Note (Addendum)
Reviewed PFTs with patient and wife today Significant smoking history Elevated eosinophil counts in the past  Likely this is more asthma COPD overlap syndrome.  Plan: Trial of Breztri inhaler today 2-week follow-up with the clinical pharmacy team to check status of inhaler trial Hold Dulera 200 at this point Slowly work on increasing your overall physical activity Follow-up in 2 months

## 2019-03-23 NOTE — Assessment & Plan Note (Signed)
Plan: Continue to follow-up with vascular Continue Eliquis

## 2019-03-23 NOTE — Assessment & Plan Note (Signed)
Plan: Complete planned high-resolution CT chest in June/2021 as ordered by Dr. Vaughan Browner

## 2019-03-23 NOTE — Assessment & Plan Note (Signed)
Plan: Complete high-resolution CT chest as ordered in June/2021

## 2019-03-23 NOTE — Assessment & Plan Note (Signed)
Plan: Continue to work with wound therapy Continue work with vein vascular

## 2019-03-23 NOTE — Assessment & Plan Note (Signed)
Plan: Trial of Breztri today

## 2019-03-30 ENCOUNTER — Other Ambulatory Visit: Payer: Self-pay | Admitting: Vascular Surgery

## 2019-03-30 DIAGNOSIS — I82409 Acute embolism and thrombosis of unspecified deep veins of unspecified lower extremity: Secondary | ICD-10-CM

## 2019-04-02 ENCOUNTER — Ambulatory Visit: Payer: Medicare Other | Attending: Internal Medicine

## 2019-04-02 DIAGNOSIS — Z23 Encounter for immunization: Secondary | ICD-10-CM | POA: Insufficient documentation

## 2019-04-02 NOTE — Progress Notes (Signed)
   Covid-19 Vaccination Clinic  Name:  [name]    MRN: [id number] DOB: 11-01-1935  04/02/2019  [name] was observed post Covid-19 immunization for 15 minutes without incidence. He was provided with Vaccine Information Sheet and instruction to access the V-Safe system.   [name] was instructed to call 911 with any severe reactions post vaccine: Marland Kitchen Difficulty breathing  . Swelling of your face and throat  . A fast heartbeat  . A bad rash all over your body  . Dizziness and weakness    Immunizations Administered    Name Date Dose VIS Date Route   Pfizer COVID-19 Vaccine 04/02/2019 10:17 AM 0.3 mL 01/20/2019 Intramuscular   Manufacturer: [place]   Lot: [id number]   [place]: [id number]

## 2019-04-04 ENCOUNTER — Other Ambulatory Visit: Payer: Self-pay

## 2019-04-04 ENCOUNTER — Encounter (HOSPITAL_BASED_OUTPATIENT_CLINIC_OR_DEPARTMENT_OTHER): Payer: Medicare Other | Admitting: Internal Medicine

## 2019-04-04 DIAGNOSIS — I87333 Chronic venous hypertension (idiopathic) with ulcer and inflammation of bilateral lower extremity: Secondary | ICD-10-CM | POA: Diagnosis not present
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
## 2019-04-06 NOTE — Progress Notes (Signed)
Subjective:  Patient referred for inhaler optimization and last seen by [name] Quaker, FNP, on 03/23/2019. Past medical history includes HTN, CAD, AAA, DVT (11/2018), COPD, coloncancer, arthritis, and HLD. He has leg wounds that are still being treated. At prior appt with [name], patient had a baseline cough and congestion; patient's inhaler was escalated from [facility] to [place].   [name], CPhT, ran test claims to determine a 30-day supply of Breztri, Trelegy, Ruthe Mannan, Spiriva Respimat, Stiolto Respimat, Flovent, Symibcort will each cost $50 via his prescription drug insurance. For 90-day supply of Trelegy, his copay will be $150.   Patient presents with his wife (permission given) today for initial appt with pharmacy team. Patient and wife have multiple questions regarding insurance, rinsing out mouth after inhalers, and administration of inhalers. Patient's wife states she has to help him press down to use Breztri. He confirms he has been inhaling 2 puffs twice daily of Breztri. He uses oxygen at night.   Respiratory Medications Current: Breztri, albuterol inhaler prn  Tried in past: Dulera (escalated to [place]) Patient reports no known adherence challenges  Objective: Allergies  Allergen Reactions  . Sulfa Antibiotics Other (See Comments)    Didn't feel right    Outpatient Encounter Medications as of 04/10/2019  Medication Sig  . acetaminophen (TYLENOL) 325 MG tablet Take 650 mg by mouth every 6 (six) hours as needed.  Marland Kitchen albuterol (VENTOLIN HFA) 108 (90 Base) MCG/ACT inhaler Inhale 2 puffs into the lungs every 6 (six) hours as needed for wheezing or shortness of breath.  Marland Kitchen amLODipine-benazepril (LOTREL) 10-40 MG per capsule Take 1 capsule by mouth daily. (Patient taking differently: Take 1 capsule by mouth at bedtime. )  . aspirin 81 MG chewable tablet Chew 81 mg by mouth every evening.  Marland Kitchen atorvastatin (LIPITOR) 20 MG tablet Take 1 tablet (20 mg total) by mouth every evening.  .  Budeson-Glycopyrrol-Formoterol (BREZTRI AEROSPHERE) 160-9-4.8 MCG/ACT AERO Inhale 2 puffs into the lungs 2 (two) times daily.  Marland Kitchen ELIQUIS 5 MG TABS tablet TAKE 2 TABLETS BY MOUTH TWICE DAILY FOR 7 DAYS, THEN 1 TABLET TWICE DAILY  . Ferrous Gluconate (IRON) 240 (27 FE) MG TABS Take 240 mg by mouth daily.   Marland Kitchen guaifenesin (ROBITUSSIN) 100 MG/5ML syrup Take 200 mg by mouth 3 (three) times daily as needed for cough.  . Influenza Vac High-Dose Quad (FLUZONE HIGH-DOSE QUADRIVALENT) 0.7 ML SUSY Fluzone High-Dose Quad 2020-21 (PF) 240 mcg/0.7 mL IM syringe  PHARMACIST ADMINISTERED IMMUNIZATION ADMINISTERED AT TIME OF DISPENSING  . Lycopene 10 MG CAPS Take 10 mg by mouth daily.  . metoprolol tartrate (LOPRESSOR) 100 MG tablet Take 1 tablet (100 mg total) by mouth at bedtime.  . mometasone-formoterol (DULERA) 100-5 MCG/ACT AERO Inhale 2 puffs into the lungs 2 (two) times daily.  . nitroGLYCERIN (NITROSTAT) 0.4 MG SL tablet Place 1 tablet (0.4 mg total) under the tongue every 5 (five) minutes as needed. (Patient taking differently: Place 0.4 mg under the tongue every 5 (five) minutes as needed for chest pain. )  . Vitamin D, Cholecalciferol, 1000 UNITS TABS Take 1,000 Units by mouth 2 (two) times daily.   No facility-administered encounter medications on file as of 04/10/2019.     Immunization History  Administered Date(s) Administered  . DTaP 07/12/2009  . Influenza Whole 11/09/2008  . Influenza, High Dose Seasonal PF 01/22/2016, 11/12/2016, 12/25/2017, 10/11/2018  . Influenza,inj,quad, With Preservative 10/11/2018  . PFIZER SARS-COV-2 Vaccination 04/02/2019  . Pneumococcal Conjugate-13 11/10/2007, 05/25/2016  . Pneumococcal Polysaccharide-23  05/25/2017  . Tdap 05/13/2009  . Zoster 05/13/2009     PFTs PFT Results Latest Ref Rng & Units 03/23/2019  FVC-Pre L 2.92  FVC-Predicted Pre % 60  FVC-Post L 3.09  FVC-Predicted Post % 63  Pre FEV1/FVC % % 69  Post FEV1/FCV % % 63  FEV1-Pre L 2.01    FEV1-Predicted Pre % 57  FEV1-Post L 1.96  DLCO UNC% % 66  DLCO COR %Predicted % 92  TLC L 6.32  TLC % Predicted % 78  RV % Predicted % 113     Chest X-ray (03/23/2019) FVC 2.92 (60% predicted), postbronchodilator ratio 63, postbronchodilator FEV1 1.96 (56% predicted), no bronchodilator response, DLCO 18.68 (66% predicted)  Eosinophils (11/14/2018) Prior elevated blood eosinophil count that was 819  PIFR  Inspiratory flow measured using the In-check DIAL G16 and was in range of 30-90 for use of Medium DPI device (Ellipta). Patient scored 80.  Inspiratory flow measured using the In-check DIAL G16 and was in range of 30-90 for use of Low DPI device (HFA). Patient scored 60 upon retraining (was initially taking too deep of a breath).  Assessment and Plan  1. Inhaler Optimization a. PIFR i. Inspiratory flow measured using the In-check DIAL G16 and was in range of 30-90 for use of Medium DPI device (Ellipta). Patient scored 80. ii. Inspiratory flow measured using the In-check DIAL G16 and was in range of 30-90 for use of Low DPI device (HFA). Patient scored 60. b. Optimal inhaler for patient would be Trelegy Ellipta 200 mcg considering patient has COPD, having difficulties with coordinating dose using HFA inhaler, and PIFR score. i. Patient was counseled on the purpose, proper use, and adverse effects of Trelegy Ellipta inhaler.  Instructed patient to rinse mouth with water after using in order to prevent fungal infection.  Patient verbalized understanding. ii. Reviewed appropriate use of maintenance vs rescue inhalers.  Stressed importance of using maintenance inhaler daily and rescue inhaler only as needed.  Patient verbalized understanding. iii. Demonstrated proper inhaler technique using Ellipa demo inhaler.  Patient able to demonstrate proper inhaler technique using teach back method. Patient was given sample in office today (Lot number: [id number], Expiration Date: 08/08/2020). iv. Will  contact patient in 1 week. If he finds Ellipta device easier to use will send in rx for 90-day supply to mail order pharmacy.   2. Medication Reconciliation a. A drug regimen assessment was performed, including review of allergies, interactions, disease-state management, dosing and immunization history. Medications were reviewed with the patient, including name, instructions, indication, goals of therapy, potential side effects, importance of adherence, and safe use. i. Metoprolol tartrate 100 mg daily - Educated patient that based on pharmacology, this formulation of metoprolol will work over a time span 12 hours and is typically dosed twice daily. Metoprolol succinate is the extended release version and will work over a time span of 24 hours. Advised patient to discuss this information with prescriber to determine appropriate dosage formulation. Patient verbalized understanding.  b. Drug interaction(s):  i. Patient reports using OTC NSAIDs (naproxen (Alleve)) for pain. Patient is currently using apixaban (Eliquis) for prior DVT. Counseled patient thoroughly on increased bleeding risk when using Eliquis with NSAIDs. Advised patient to solely use Tylenol for pain. Patient verbalized understanding.   3. Immunizations a. Patient is up to date with all vaccinations.  This appointment required 45 minutes of patient care (this includes precharting, chart review, review of results, face-to-face care, etc.).  Thank you for involving pharmacy to assist  in providing this patient's care.   Drexel Iha, PharmD PGY2 Ambulatory Care Pharmacy Resident

## 2019-04-10 ENCOUNTER — Ambulatory Visit (INDEPENDENT_AMBULATORY_CARE_PROVIDER_SITE_OTHER): Payer: Medicare Other | Admitting: Pharmacist

## 2019-04-10 ENCOUNTER — Other Ambulatory Visit: Payer: Self-pay

## 2019-04-10 DIAGNOSIS — J449 Chronic obstructive pulmonary disease, unspecified: Secondary | ICD-10-CM | POA: Diagnosis not present

## 2019-04-10 MED ORDER — TRELEGY ELLIPTA 200-62.5-25 MCG/INH IN AEPB: 1.0000 | INHALATION_SPRAY | Freq: Every day | RESPIRATORY_TRACT | 0 refills | Status: DC

## 2019-04-10 NOTE — Patient Instructions (Addendum)
It was a pleasure seeing you in clinic today Mr. Bland!  Today the plan is... 1. Take 2 puffs of Breztri inhaler tonight then STOP Breztri. START using Trelegy Ellipta one puff daily tomorrow (04/11/19). Please remember to rinse your mouth out with each of these inhalers!  2. Do not take Alleve/Naproxen/Motrin/Ibuprofen with apixaban. It will increase your likelihood of bleeding. Please take Tylenol. 3. You are taking metoprolol tartrate 100 mg daily. This medication works over 12 hours therefore it only works half of the day. You may want to consider talking to your provide to switch you to metoprolol succinate since this formulation works over 24 hours.   Please call the PharmD clinic at [phone] if you have any questions that you would like to speak with a pharmacist about Stanton Kidney, Museum/gallery conservator).

## 2019-04-11 ENCOUNTER — Other Ambulatory Visit: Payer: Self-pay | Admitting: *Deleted

## 2019-04-11 DIAGNOSIS — I82409 Acute embolism and thrombosis of unspecified deep veins of unspecified lower extremity: Secondary | ICD-10-CM

## 2019-04-13 ENCOUNTER — Ambulatory Visit: Payer: Medicare Other | Admitting: Vascular Surgery

## 2019-04-13 ENCOUNTER — Encounter (HOSPITAL_COMMUNITY): Payer: Medicare Other

## 2019-04-14 ENCOUNTER — Ambulatory Visit (HOSPITAL_COMMUNITY)
Admission: RE | Admit: 2019-04-14 | Discharge: 2019-04-14 | Disposition: A | Discharge status: Home / Self Care | Payer: Medicare Other | Source: Ambulatory Visit | Attending: Surgery | Admitting: Surgery

## 2019-04-14 ENCOUNTER — Encounter: Payer: Self-pay | Admitting: Vascular Surgery

## 2019-04-14 ENCOUNTER — Other Ambulatory Visit: Payer: Self-pay

## 2019-04-14 ENCOUNTER — Ambulatory Visit (INDEPENDENT_AMBULATORY_CARE_PROVIDER_SITE_OTHER): Payer: Medicare Other | Admitting: Vascular Surgery

## 2019-04-14 VITALS — BP 128/72 | HR 68 | Temp 97.1°F | Resp 20 | Ht 75.0 in | Wt 256.0 lb

## 2019-04-14 DIAGNOSIS — S81809A Unspecified open wound, unspecified lower leg, initial encounter: Secondary | ICD-10-CM | POA: Diagnosis not present

## 2019-04-14 DIAGNOSIS — I82409 Acute embolism and thrombosis of unspecified deep veins of unspecified lower extremity: Secondary | ICD-10-CM | POA: Diagnosis present

## 2019-04-14 NOTE — Progress Notes (Signed)
Patient ID: [name], male   DOB: [birth year], 84 y.o.   MRN: [id number]  Reason for Consult: Follow-up   Referred by Renne Crigler, NP  Subjective:     HPI:  [name] is a 84 y.o. male history of bilateral lower extremity lymphedema with venous stasis ulceration.  He is being followed at the wound care center.  He is undergone angiography there is no intervention to be undertaken even though his ABIs were moderate range.  His previous history of colon cancer did undergo radiation for that has been considered a source of his lymphedema.  Also has abnormal stress test with poor perfusion of the apical and inferior wall ischemia.  Recently had DVT negative for PE has been placed on anticoagulation.  Now returns for venous reflux testing.  Past Medical History:  Diagnosis Date  . Arthritis   . CAD (coronary artery disease)    Abnormal stress perfusion study with apical and inferior wall ischemia. LAD had a long 99% stenosis followed by 30% stenosis, first diagonal 25% stenosis, circumflex luminal irregularities, second obtuse marginal 25% stenosis, RCA 40% followed by 40% stenosis. EF 60%. Stenting with 2 bare metal stents by Dr. Burt Knack to the LAD.  Marland Kitchen Colon cancer [facility])    Radiation, chemo, surgery 2011  . Complication of anesthesia    Pt states he coughs alot during anesthesia  . COPD (chronic obstructive pulmonary disease) ([place])   . History of tobacco use   . Hypertension    Family History  Adopted: Yes   Past Surgical History:  Procedure Laterality Date  . ABDOMINAL AORTOGRAM W/LOWER EXTREMITY N/A 01/09/2019   Procedure: ABDOMINAL AORTOGRAM W/LOWER EXTREMITY;  Surgeon: Waynetta Sandy, MD;  Location: [facility];  Service: Cardiovascular;  Laterality: N/A;  . CATARACT EXTRACTION W/PHACO Left 05/29/2016   Procedure: CATARACT EXTRACTION PHACO AND INTRAOCULAR LENS PLACEMENT (IOC);  Surgeon: Baruch Goldmann, MD;  Location: [facility];  Service: Ophthalmology;   Laterality: Left;  CDE: 10.39  . CATARACT EXTRACTION W/PHACO Right 06/12/2016   Procedure: CATARACT EXTRACTION PHACO AND INTRAOCULAR LENS PLACEMENT RIGHT EYE CDE= 13.59;  Surgeon: Baruch Goldmann, MD;  Location: [facility];  Service: Ophthalmology;  Laterality: Right;  right  . CORONARY ANGIOPLASTY WITH STENT PLACEMENT  2008   x 2  . HEMICOLECTOMY  01/10/2010  . INGUINAL HERNIA REPAIR  2012  . PORTACATH PLACEMENT  2011  . portacath removal  2011  . UMBILICAL HERNIA REPAIR  01/2008   left    Short Social History:  Social History   Tobacco Use  . Smoking status: Former Smoker    Packs/day: 3.00    Years: 57.00    Pack years: 171.00    Quit date: 2006    Years since quitting: 15.1  . Smokeless tobacco: Never Used  . Tobacco comment: quit smoking 2006  Substance Use Topics  . Alcohol use: No    Allergies  Allergen Reactions  . Sulfa Antibiotics Other (See Comments)    Nausea    Current Outpatient Medications  Medication Sig Dispense Refill  . acetaminophen (TYLENOL) 325 MG tablet Take 650 mg by mouth every 6 (six) hours as needed.    Marland Kitchen albuterol (VENTOLIN HFA) 108 (90 Base) MCG/ACT inhaler Inhale 2 puffs into the lungs every 6 (six) hours as needed for wheezing or shortness of breath. 6.7 g 0  . ALBUTEROL IN albuterol sulf 90 mcg/actuation breath activated powder inhaler,sensor  Inhale 2 puffs every 6 hours by  inhalation route.    Marland Kitchen amLODipine-benazepril (LOTREL) 10-40 MG per capsule Take 1 capsule by mouth daily. (Patient taking differently: Take 1 capsule by mouth at bedtime. ) 90 capsule 1  . aspirin 81 MG chewable tablet Chew 81 mg by mouth every evening.    Marland Kitchen atorvastatin (LIPITOR) 20 MG tablet Take 1 tablet (20 mg total) by mouth every evening.    . benazepril (LOTENSIN) 40 MG tablet benazepril 40 mg tablet  Take 1 tablet every day by oral route.    . Calcium 150 MG TABS Take 1 tablet by mouth daily.    Marland Kitchen ELIQUIS 5 MG TABS tablet TAKE 2 TABLETS BY MOUTH TWICE DAILY FOR 7  DAYS, THEN 1 TABLET TWICE DAILY 30 tablet 0  . Ferrous Gluconate (IRON) 240 (27 FE) MG TABS Take 240 mg by mouth daily.     . Fluticasone-Umeclidin-Vilant (TRELEGY ELLIPTA) 200-62.5-25 MCG/INH AEPB Inhale 1 puff into the lungs daily. (SAMPLE, Lot number [id number], Expiration Date 6/31/2022) 28 each 0  . guaifenesin (ROBITUSSIN) 100 MG/5ML syrup Take 200 mg by mouth 3 (three) times daily as needed for cough.    . Influenza Vac High-Dose Quad (FLUZONE HIGH-DOSE QUADRIVALENT) 0.7 ML SUSY Fluzone High-Dose Quad 2020-21 (PF) 240 mcg/0.7 mL IM syringe  PHARMACIST ADMINISTERED IMMUNIZATION ADMINISTERED AT TIME OF DISPENSING    . Lycopene 10 MG CAPS Take 10 mg by mouth daily.    . Magnesium 100 MG CAPS Take 1 capsule by mouth daily.    . metoprolol tartrate (LOPRESSOR) 100 MG tablet Take 1 tablet (100 mg total) by mouth at bedtime.    . mometasone-formoterol (DULERA) 100-5 MCG/ACT AERO Dulera 100 mcg-5 mcg/actuation HFA aerosol inhaler  INHALE 2 PUFFS BY MOUTH TWICE DAILY    . nitroGLYCERIN (NITROSTAT) 0.4 MG SL tablet Place 1 tablet (0.4 mg total) under the tongue every 5 (five) minutes as needed. (Patient taking differently: Place 0.4 mg under the tongue every 5 (five) minutes as needed for chest pain. ) 25 tablet 3  . tamsulosin (FLOMAX) 0.4 MG CAPS capsule Take 0.4 mg by mouth daily.    . Vitamin D, Cholecalciferol, 1000 UNITS TABS Take 1,000 Units by mouth 2 (two) times daily.    . Zinc Sulfate (ZINC 15 PO) Take 1 tablet by mouth daily.     No current facility-administered medications for this visit.    Review of Systems  Constitutional:  Constitutional negative. HENT: HENT negative.  Eyes: Eyes negative.  Cardiovascular: Positive for leg swelling.  GI: Gastrointestinal negative.  Musculoskeletal: Positive for gait problem and joint pain.  Skin: Positive for wound.  Hematologic: Hematologic/lymphatic negative.  Psychiatric: Psychiatric negative.        Objective:  Objective   Vitals:    04/14/19 1422  BP: 128/72  Pulse: 68  Resp: 20  Temp: (!) 97.1 F (36.2 C)  SpO2: 93%  Weight: 256 lb (116.1 kg)  Height: 6\' 3"  (1.905 m)   Body mass index is 32 kg/m.  Physical Exam HENT:     Head: Normocephalic.     Nose:     Comments: Mask in place Eyes:     Pupils: Pupils are equal, round, and reactive to light.  Cardiovascular:     Rate and Rhythm: Normal rate.  Pulmonary:     Effort: Pulmonary effort is normal.  Abdominal:     General: Abdomen is flat.     Palpations: Abdomen is soft.  Musculoskeletal:        General: Swelling present.  Normal range of motion.     Cervical back: Normal range of motion.     Comments: Compression on left lower extremity  Neurological:     General: No focal deficit present.     Mental Status: He is alert.  Psychiatric:        Mood and Affect: Mood normal.        Behavior: Behavior normal.        Thought Content: Thought content normal.        Judgment: Judgment normal.     Data: Independently interpreted her lower extremity venous reflux studies.  There is no evidence of superficial venous reflux in the right greater saphenous vein or her small saphenous vein.  No evidence of superficial venous reflux in the left either.     Assessment/Plan:     84 year old male with bilateral lower extremity swelling persistent left lower extremity wound.  This is likely multifactorial.  There is unfortunately no intervention to be undertaken on his superficial venous system or on his arterial system with previous angiogram.  He will continue with the wound center.  I discussed that after they get the wound healed the prevention will be the best medicine.  He can follow-up with me on an as-needed basis.     Waynetta Sandy MD [facility]

## 2019-04-17 ENCOUNTER — Telehealth: Payer: Self-pay | Admitting: Pharmacist

## 2019-04-17 ENCOUNTER — Telehealth: Payer: Self-pay | Admitting: Pulmonary Disease

## 2019-04-17 DIAGNOSIS — J449 Chronic obstructive pulmonary disease, unspecified: Secondary | ICD-10-CM

## 2019-04-17 MED ORDER — TRELEGY ELLIPTA 200-62.5-25 MCG/INH IN AEPB: 1.0000 | INHALATION_SPRAY | Freq: Every day | RESPIRATORY_TRACT | 3 refills | Status: DC

## 2019-04-17 NOTE — Telephone Encounter (Signed)
Returned patient's call on 04/17/2019 at 1:00 PM   Patient confirms he is tolerating Trelegy Ellipta and would prefer Trelegy > Breztri. Will send in rx to [facility]. Patient expressed appreciation. Advised patient to contact pharmacy team if she has any further questions/concerns. Patient verbalized understanding.  Thank you for involving pharmacy to assist in providing this patient's care.   Drexel Iha, PharmD PGY2 Ambulatory Care Pharmacy Resident

## 2019-04-17 NOTE — Telephone Encounter (Signed)
As always thank you for your help and communication!  [name]

## 2019-04-17 NOTE — Telephone Encounter (Signed)
Called patient on 04/17/2019 at 11:11 AM and left HIPAA-compliant VM with instructions to call [facility] back   Plan to discuss if patient is tolerating Trelegy Ellipta better than Breztri or not. Will send in rx for whichever inhaler he prefers.   Thank you for involving pharmacy to assist in providing this patient's care.   Drexel Iha, PharmD PGY2 Ambulatory Care Pharmacy Resident

## 2019-04-17 NOTE — Addendum Note (Signed)
Addended by: Ellwood Handler on: 04/17/2019 01:05 PM   Modules accepted: Orders

## 2019-04-18 ENCOUNTER — Other Ambulatory Visit: Payer: Self-pay

## 2019-04-18 ENCOUNTER — Encounter (HOSPITAL_BASED_OUTPATIENT_CLINIC_OR_DEPARTMENT_OTHER): Payer: Medicare Other | Attending: Internal Medicine | Admitting: Internal Medicine

## 2019-04-18 DIAGNOSIS — Z955 Presence of coronary angioplasty implant and graft: Secondary | ICD-10-CM | POA: Insufficient documentation

## 2019-04-18 DIAGNOSIS — L97328 Non-pressure chronic ulcer of left ankle with other specified severity: Secondary | ICD-10-CM | POA: Insufficient documentation

## 2019-04-18 DIAGNOSIS — Z85038 Personal history of other malignant neoplasm of large intestine: Secondary | ICD-10-CM | POA: Insufficient documentation

## 2019-04-18 DIAGNOSIS — I2510 Atherosclerotic heart disease of native coronary artery without angina pectoris: Secondary | ICD-10-CM | POA: Insufficient documentation

## 2019-04-18 DIAGNOSIS — I87333 Chronic venous hypertension (idiopathic) with ulcer and inflammation of bilateral lower extremity: Secondary | ICD-10-CM | POA: Insufficient documentation

## 2019-04-18 DIAGNOSIS — Z86718 Personal history of other venous thrombosis and embolism: Secondary | ICD-10-CM | POA: Insufficient documentation

## 2019-04-18 DIAGNOSIS — I714 Abdominal aortic aneurysm, without rupture: Secondary | ICD-10-CM | POA: Insufficient documentation

## 2019-04-18 DIAGNOSIS — I10 Essential (primary) hypertension: Secondary | ICD-10-CM | POA: Insufficient documentation

## 2019-04-18 DIAGNOSIS — L97818 Non-pressure chronic ulcer of other part of right lower leg with other specified severity: Secondary | ICD-10-CM | POA: Insufficient documentation
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
## 2019-04-19 NOTE — Progress Notes (Addendum)
[name] ([id number]) Visit Report for 04/18/2019 Arrival Information Details Patient Name: Date of Service: [name], [name] 04/18/2019 10:00 AM Medical Record Number:[id number] Patient Account Number: [id number] Date of Birth/Sex: Treating RN: 1935/08/10 (84 y.o. [name], [name] Primary Care Nimra Puccinelli: Haynes Hoehn Other Clinician: Referring Hadyn Blanck: Treating Anna Livers/Extender:[name], [name], [name]: 18 Visit Information History Since Last Visit Added or deleted any medications: Yes Patient Arrived: Wheel Chair Any new allergies or adverse reactions: No Arrival Time: 10:33 Had a fall or experienced change in No activities of daily living that may affect Accompanied By: wife risk of falls: Transfer Assistance: None Signs or symptoms of abuse/neglect since last No Patient Identification Verified: Yes visito Secondary Verification Process Completed: Yes Hospitalized since last visit: No Patient Requires Transmission-Based No Implantable device outside of the clinic excluding No Precautions: cellular tissue based products placed in the center Patient Has Alerts: No since last visit: Has Dressing in Place as Prescribed: Yes Has Compression in Place as Prescribed: Yes Pain Present Now: No Electronic Signature(s) Signed: 04/18/2019 6:17:27 PM By: Baruch [name] RN, BSN Entered By: Baruch [name] on 04/18/2019 10:34:44 -------------------------------------------------------------------------------- Compression Therapy Details Patient Name: Date of Service: [name] 04/18/2019 10:00 AM Medical Record Number:[id number] Patient Account Number: [id number] Date of Birth/Sex: Treating RN: 1935-06-14 (83 y.o. [name]) Carlene Coria Primary Care Jaidee Stipe: Haynes Hoehn Other Clinician: Referring Maguadalupe Lata: Treating Kennet Mccort/Extender:[name], [name], [name]: 18 Compression Therapy Performed for Wound Wound #4 Left,Medial  Malleolus Assessment: Performed By: Jake Church, RN Compression Type: Three Layer Post Procedure Diagnosis Same as Pre-procedure Electronic Signature(s) Signed: 04/19/2019 7:20:02 AM By: Carlene Coria RN Entered By: Carlene Coria on 04/18/2019 11:34:12 -------------------------------------------------------------------------------- Encounter Discharge Information Details Patient Name: Date of Service: [name]. 04/18/2019 10:00 AM Medical Record Number:[id number] Patient Account Number: [id number] Date of Birth/Sex: Treating RN: 08-Jan-1936 (84 y.o. [name] Primary Care Janilah Hojnacki: Haynes Hoehn Other Clinician: Referring Draylon Mercadel: Treating Mylie Mccurley/Extender:[name], [name], [name]: 45 Encounter Discharge Information Items Discharge Condition: Stable Ambulatory Status: Walker Discharge Destination: Home Transportation: Private Auto Accompanied By: wife Schedule Follow-up Appointment: Yes Clinical Summary of Care: Patient Declined Electronic Signature(s) Signed: 04/18/2019 5:46:17 PM By: Kela Millin Entered By: Kela Millin on 04/18/2019 11:53:48 -------------------------------------------------------------------------------- Lower Extremity Assessment Details Patient Name: Date of Service: [name]. 04/18/2019 10:00 AM Medical Record Number:[id number] Patient Account Number: [id number] Date of Birth/Sex: Treating RN: 08/15/1935 (84 y.o. [name] Primary Care Glorie Dowlen: Haynes Hoehn Other Clinician: Referring Kendyll Huettner: Treating Chavela Justiniano/Extender:[name], [name], [name]: 18 Edema Assessment Assessed: [Left: No] [Right: No] Edema: [Left: Ye] [Right: s] Calf Left: Right: Point of Measurement: 45 cm From Medial Instep 42 cm cm Ankle Left: Right: Point of Measurement: 11 cm From Medial Instep 30.5 cm cm Vascular Assessment Pulses: Dorsalis Pedis Palpable: [Left:Yes] Electronic  Signature(s) Signed: 04/18/2019 6:17:27 PM By: Baruch [name] RN, BSN Entered By: Baruch [name] on 04/18/2019 10:40:32 -------------------------------------------------------------------------------- Multi Wound Chart Details Patient Name: Date of Service: [name] 04/18/2019 10:00 AM Medical Record Number:[id number] Patient Account Number: [id number] Date of Birth/Sex: Treating RN: 1935-09-16 (83 y.o. [name]) Carlene Coria Primary Care Andretta Ergle: Haynes Hoehn Other Clinician: Referring Cecille Mcclusky: Treating Kamala Kolton/Extender:[name], [name], [name]: 18 Vital Signs Height(in): 76 Pulse(bpm): 53 Weight(lbs): 227 Blood Pressure(mmHg): 128/60 Body Mass Index(BMI): 28 Temperature(F): 97.6 Respiratory 18 Rate(breaths/min): Photos: [4:No Photos] [N/A:N/A] Wound Location: [4:Left Malleolus - Medial] [N/A:N/A] Wounding Event: [4:Gradually Appeared] [N/A:N/A] Primary Etiology: [4:Venous Leg Ulcer] [N/A:N/A] Comorbid  History: [4:Chronic Obstructive Pulmonary Disease (COPD), Coronary Artery Disease, Hypertension, Peripheral Venous Disease, Received Chemotherapy, Received Radiation] [N/A:N/A] Date Acquired: [4:03/12/2018] [N/A:N/A] Weeks of Treatment: [4:18] [N/A:N/A] Wound Status: [4:Open] [N/A:N/A] Measurements L x W x D 2.1x5.8x0.1 [N/A:N/A] (cm) Area (cm) : [4:9.566] [N/A:N/A] Volume (cm) : [4:0.957] [N/A:N/A] % Reduction in Area: [4:-29.60%] [N/A:N/A] % Reduction in Volume: [4:35.20%] [N/A:N/A] Classification: [4:Full Thickness Without Exposed Support Structures] [N/A:N/A] Exudate Amount: [4:Medium] [N/A:N/A] Exudate Type: [4:Serous] [N/A:N/A] Exudate Color: [4:amber] [N/A:N/A] Wound Margin: [4:Indistinct, nonvisible] [N/A:N/A] Granulation Amount: [4:Large (67-100%)] [N/A:N/A] Granulation Quality: [4:Pink] [N/A:N/A] Necrotic Amount: [4:Small (1-33%)] [N/A:N/A] Exposed Structures: [4:Fat Layer (Subcutaneous N/A Tissue) Exposed: Yes Fascia: No Tendon: No  Muscle: No Joint: No Bone: No Medium (34-66%)] [N/A:N/A] Treatment Notes Electronic Signature(s) Signed: 04/18/2019 5:48:31 PM By: Linton Ham MD Signed: 04/19/2019 7:20:02 AM By: Carlene Coria RN Entered By: Linton Ham on 04/18/2019 11:29:26 -------------------------------------------------------------------------------- Multi-Disciplinary Care Plan Details Patient Name: Date of Service: [name]. 04/18/2019 10:00 AM Medical Record Number:[id number] Patient Account Number: [id number] Date of Birth/Sex: Treating RN: 1935-03-26 (83 y.o. [name]) Carlene Coria Primary Care Melayna Robarts: Haynes Hoehn Other Clinician: Referring Varian Innes: Treating Caleel Kiner/Extender:[name], [name], [name]: 18 Active Inactive Wound/Skin Impairment Nursing Diagnoses: Impaired tissue integrity Knowledge deficit related to smoking impact on wound healing Knowledge deficit related to ulceration/compromised skin integrity Goals: Patient/caregiver will verbalize understanding of skin care regimen Date Initiated: 02/23/2019 Target Resolution Date: 04/21/2019 Goal Status: Active Ulcer/skin breakdown will have a volume reduction of 30% by week 4 Target Resolution Date Initiated: 12/12/2018 Date Inactivated: 02/09/2019 Date: 02/03/2019 Goal Status: Met Interventions: Provide education on ulcer and skin care Notes: Electronic Signature(s) Signed: 04/19/2019 7:20:02 AM By: Carlene Coria RN Entered By: Carlene Coria on 04/18/2019 10:52:56 -------------------------------------------------------------------------------- Pain Assessment Details Patient Name: Date of Service: [name], [name] 04/18/2019 10:00 AM Medical Record Number:[id number] Patient Account Number: [id number] Date of Birth/Sex: Treating RN: 03-13-1935 (84 y.o. [name] Primary Care Treniece Holsclaw: Haynes Hoehn Other Clinician: Referring Sarely Stracener: Treating Zeriyah Wain/Extender:[name], [name], [name]:  18 Active Problems Location of Pain Severity and Description of Pain Patient Has Paino No Site Locations Rate the pain. Current Pain Level: 0 Pain Management and Medication Current Pain Management: Electronic Signature(s) Signed: 04/18/2019 6:17:27 PM By: Baruch [name] RN, BSN Entered By: Baruch [name] on 04/18/2019 10:36:12 -------------------------------------------------------------------------------- Patient/Caregiver Education Details Patient Name: Date of Service: [name], [name] 3/9/2021andnbsp10:00 AM Medical Record Number:[id number] Patient Account Number: [id number] Date of Birth/Gender: Treating RN: 1935/07/01 (83 y.o. [name]) Carlene Coria Primary Care Physician: Haynes Hoehn Other Clinician: Referring Physician: Treating Physician/Extender:[name], [name], Haynes Hoehn in Treatment: 31 Education Assessment Education Provided To: Patient Education Topics Provided Wound/Skin Impairment: Methods: Explain/Verbal Responses: State content correctly Electronic Signature(s) Signed: 04/19/2019 7:20:02 AM By: Carlene Coria RN Entered By: Carlene Coria on 04/18/2019 10:53:21 -------------------------------------------------------------------------------- Wound Assessment Details Patient Name: Date of Service: [name] 04/18/2019 10:00 AM Medical Record Number:[id number] Patient Account Number: [id number] Date of Birth/Sex: Treating RN: [birth year] (83 y.o. [name]) Carlene Coria Primary Care Mahlia Fernando: Haynes Hoehn Other Clinician: Referring Jaque Dacy: Treating Cesario Weidinger/Extender:[name], [name], [name]: 18 Wound Status Wound Number: 4 Primary Venous Leg Ulcer Etiology: Wound Location: Left Malleolus - Medial Wound Open Wounding Event: Gradually Appeared Status: Date Acquired: 03/12/2018 Comorbid Chronic Obstructive Pulmonary Disease Weeks Of Treatment: 18 History: (COPD), Coronary Artery Disease, Clustered Wound: No Hypertension, Peripheral  Venous Disease, Received Chemotherapy, Received Radiation Photos Wound Measurements Length: (cm) 2.1 % Reduct Width: (cm) 5.8 % Reduct Depth: (cm)  0.1 Epitheli Area: (cm) 9.566 Tunneli Volume: (cm) 0.957 Undermi Wound Description Classification: Full Thickness Without Exposed Support Foul Odo Structures Slough/F Wound Indistinct, nonvisible Margin: Exudate Medium Amount: Exudate Serous Type: Exudate amber Color: Wound Bed Granulation Amount: Large (67-100%) Granulation Quality: Pink Fascia E Necrotic Amount: Small (1-33%) Fat Laye Necrotic Quality: Adherent Slough Tendon E Muscle E Joint Ex Bone Exp r After Cleansing: No ibrino Yes Exposed Structure xposed: No r (Subcutaneous Tissue) Exposed: Yes xposed: No xposed: No posed: No osed: No ion in Area: -29.6% ion in Volume: 35.2% alization: Medium (34-66%) ng: No ning: No Treatment Notes Wound #4 (Left, Medial Malleolus) 1. Cleanse With Wound Cleanser 2. Periwound Care TCA Cream 3. Primary Dressing Applied Calcium Alginate Ag 4. Secondary Dressing Foam 6. Support Layer Applied 3 layer compression wrap Notes stockinette Electronic Signature(s) Signed: 04/19/2019 2:27:25 PM By: Mikeal Hawthorne EMT/HBOT Signed: 04/19/2019 5:35:23 PM By: Carlene Coria RN Previous Signature: 04/18/2019 6:17:27 PM Version By: Baruch [name] RN, BSN Entered By: Mikeal Hawthorne on 04/19/2019 13:52:43 -------------------------------------------------------------------------------- Vitals Details Patient Name: Date of Service: [name], [name] 04/18/2019 10:00 AM Medical Record Number:[id number] Patient Account Number: [id number] Date of Birth/Sex: Treating RN: [birth year] (84 y.o. [name] Primary Care Dioselina Brumbaugh: Haynes Hoehn Other Clinician: Referring Jessalynn Mccowan: Treating Vaiden Adames/Extender:[name], [name], [name]: 18 Vital Signs Time Taken: 10:35 Temperature (F): 97.6 Height (in): 76 Pulse  (bpm): 53 Source: Stated Respiratory Rate (breaths/min): 18 Weight (lbs): 227 Blood Pressure (mmHg): 128/60 Source: Stated Reference Range: 80 - 120 mg / dl Body Mass Index (BMI): 27.6 Electronic Signature(s) Signed: 04/18/2019 6:17:27 PM By: Baruch [name] RN, BSN Entered By: Baruch [name] on 04/18/2019 10:36:03

## 2019-04-20 NOTE — Telephone Encounter (Signed)
Mary, please advise if you have spoken with pt. Thanks.

## 2019-04-24 ENCOUNTER — Telehealth: Payer: Self-pay | Admitting: Pulmonary Disease

## 2019-04-24 DIAGNOSIS — J449 Chronic obstructive pulmonary disease, unspecified: Secondary | ICD-10-CM

## 2019-04-24 NOTE — Telephone Encounter (Signed)
Spoke with [place] and she is requesting we call [facility] to verify prescription for a 90 day supply. I advised her that I left a sample up front for her to pick up. I spoke with a customer service rep and she stated the Rx's had been maileld on 04/18/2019. I called [name] to make her aware. She understood and will call us if they do not receive Rx. Nothing further is needed.

## 2019-04-25 ENCOUNTER — Other Ambulatory Visit: Payer: Self-pay

## 2019-04-25 ENCOUNTER — Encounter (HOSPITAL_BASED_OUTPATIENT_CLINIC_OR_DEPARTMENT_OTHER): Payer: Medicare Other | Admitting: Internal Medicine

## 2019-04-25 DIAGNOSIS — L97328 Non-pressure chronic ulcer of left ankle with other specified severity: Secondary | ICD-10-CM | POA: Diagnosis not present

## 2019-04-25 DIAGNOSIS — Z86718 Personal history of other venous thrombosis and embolism: Secondary | ICD-10-CM | POA: Diagnosis not present

## 2019-04-25 DIAGNOSIS — L97818 Non-pressure chronic ulcer of other part of right lower leg with other specified severity: Secondary | ICD-10-CM | POA: Diagnosis not present

## 2019-04-25 DIAGNOSIS — Z85038 Personal history of other malignant neoplasm of large intestine: Secondary | ICD-10-CM | POA: Diagnosis not present

## 2019-04-25 DIAGNOSIS — I714 Abdominal aortic aneurysm, without rupture: Secondary | ICD-10-CM | POA: Diagnosis not present

## 2019-04-25 DIAGNOSIS — I2510 Atherosclerotic heart disease of native coronary artery without angina pectoris: Secondary | ICD-10-CM | POA: Diagnosis not present

## 2019-04-25 DIAGNOSIS — Z955 Presence of coronary angioplasty implant and graft: Secondary | ICD-10-CM | POA: Diagnosis not present

## 2019-04-25 DIAGNOSIS — I10 Essential (primary) hypertension: Secondary | ICD-10-CM | POA: Diagnosis not present

## 2019-04-25 DIAGNOSIS — I87333 Chronic venous hypertension (idiopathic) with ulcer and inflammation of bilateral lower extremity: Secondary | ICD-10-CM | POA: Diagnosis present

## 2019-04-26 ENCOUNTER — Ambulatory Visit: Payer: Medicare Other | Attending: Internal Medicine

## 2019-04-26 DIAGNOSIS — Z23 Encounter for immunization: Secondary | ICD-10-CM
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
## 2019-04-26 NOTE — Progress Notes (Signed)
   [facility]  Name:  [name]    MRN: [id number] DOB: [birth year]  04/26/2019  [name] was observed post Covid-19 immunization for 15 minutes without incident. He was provided with Vaccine Information Sheet and instruction to access the V-Safe system.   [name] was instructed to call 911 with any severe reactions post vaccine: Marland Kitchen Difficulty breathing  . Swelling of face and throat  . A fast heartbeat  . A bad rash all over body  . Dizziness and weakness   Immunizations Administered    Name Date Dose VIS Date Route   Pfizer COVID-19 Vaccine 04/26/2019  9:32 AM 0.3 mL 01/20/2019 Intramuscular   Manufacturer: [place]   Lot: [id number]   [place]: [id number]

## 2019-05-02 ENCOUNTER — Other Ambulatory Visit: Payer: Self-pay

## 2019-05-02 ENCOUNTER — Encounter (HOSPITAL_BASED_OUTPATIENT_CLINIC_OR_DEPARTMENT_OTHER): Payer: Medicare Other | Admitting: Internal Medicine

## 2019-05-02 DIAGNOSIS — I87333 Chronic venous hypertension (idiopathic) with ulcer and inflammation of bilateral lower extremity: Secondary | ICD-10-CM | POA: Diagnosis not present

## 2019-05-05 ENCOUNTER — Other Ambulatory Visit: Payer: Self-pay | Admitting: Vascular Surgery

## 2019-05-05 DIAGNOSIS — I82409 Acute embolism and thrombosis of unspecified deep veins of unspecified lower extremity: Secondary | ICD-10-CM

## 2019-05-09 ENCOUNTER — Other Ambulatory Visit: Payer: Self-pay

## 2019-05-09 ENCOUNTER — Encounter (HOSPITAL_BASED_OUTPATIENT_CLINIC_OR_DEPARTMENT_OTHER): Payer: Medicare Other | Admitting: Internal Medicine

## 2019-05-09 DIAGNOSIS — I87333 Chronic venous hypertension (idiopathic) with ulcer and inflammation of bilateral lower extremity: Secondary | ICD-10-CM | POA: Diagnosis not present

## 2019-05-15 NOTE — Progress Notes (Signed)
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 Regino Bellow office to make sure that there wasn't something else that would dictate a longer duration of treatment #4-I have been clear with the patient that I felt we didn't have an exact diagnosis here. 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 [name]) Dolores Lory, Morey Hummingbird Primary Care Provider: Haynes Hoehn Other Clinician: Referring Provider: Treating Provider/Extender:Sneijder Bernards, [name], [name]: 21 Diagnosis Coding ICD-10 Codes Code Description I87.333 Chronic venous hypertension (idiopathic) with ulcer and inflammation of bilateral lower extremity L97.818  Non-pressure chronic ulcer of other part of right lower leg with other specified severity L97.328 Non-pressure chronic ulcer of left ankle with other specified severity Facility Procedures The patient participates with Medicare or their insurance follows the Medicare Facility Guidelines: CPT4 Code Description Modifier Quantity [id number] - WOUND CARE VISIT-LEV 2 EST PT 1 Physician Procedures CPT4: Description Modifier Quantity Code [id number] - WC PHYS LEVEL 3 - EST PT 1 ICD-10 Diagnosis Description I87.333 Chronic venous hypertension (idiopathic) with ulcer and inflammation of bilateral lower extremity L97.818 Non-pressure chronic ulcer  of other part of right lower leg with other specified severity L97.328 Non-pressure chronic ulcer of left ankle with other specified severity Electronic Signature(s) Signed: 05/15/2019 7:44:34 AM By: Linton Ham MD Previous Signature: 05/10/2019 5:40:01 PM Version By: Carlene Coria RN Entered By: Linton Ham on 05/11/2019 07:39:14

## 2019-05-22 ENCOUNTER — Ambulatory Visit (INDEPENDENT_AMBULATORY_CARE_PROVIDER_SITE_OTHER): Payer: Medicare Other | Admitting: Pulmonary Disease

## 2019-05-22 ENCOUNTER — Encounter: Payer: Self-pay | Admitting: Pulmonary Disease

## 2019-05-22 ENCOUNTER — Other Ambulatory Visit: Payer: Self-pay

## 2019-05-22 VITALS — BP 112/60 | HR 52 | Temp 97.6°F | Ht 76.0 in | Wt 255.8 lb

## 2019-05-22 DIAGNOSIS — R911 Solitary pulmonary nodule: Secondary | ICD-10-CM

## 2019-05-22 DIAGNOSIS — R918 Other nonspecific abnormal finding of lung field: Secondary | ICD-10-CM

## 2019-05-22 DIAGNOSIS — Z87891 Personal history of nicotine dependence: Secondary | ICD-10-CM

## 2019-05-22 DIAGNOSIS — L97202 Non-pressure chronic ulcer of unspecified calf with fat layer exposed: Secondary | ICD-10-CM

## 2019-05-22 DIAGNOSIS — I83002 Varicose veins of unspecified lower extremity with ulcer of calf: Secondary | ICD-10-CM | POA: Diagnosis not present

## 2019-05-22 DIAGNOSIS — J449 Chronic obstructive pulmonary disease, unspecified: Secondary | ICD-10-CM

## 2019-05-22 NOTE — Patient Instructions (Addendum)
You were seen today by Lauraine Rinne, NP  for:   Nice seeing you in office today.  Continue taking the Trelegy Ellipta.  Please read the information below if you start to notice worsening symptoms please contact our office.  We will see you back in about 3 to 4 months.  Sooner if you need.  Please contact me if you are traveling out of town so we can provide you with a course of antibiotics and prednisone just in case you become symptomatic.  Please work on increasing your overall physical activity daily.[name] seeing you,  Take care and stay safe,  [name]  1. COPD mixed type (HCC)  Trelegy Ellipta 200 >>> 1 puff daily in the morning >>>rinse mouth out after use  >>> This inhaler contains 3 medications that help manage her respiratory status, contact our office if you cannot afford this medication or unable to remain on this medication  Only use your albuterol as a rescue medication to be used if you can't catch your breath by resting or doing a relaxed purse lip breathing pattern.  - The less you use it, the better it will work when you need it. - Ok to use up to 2 puffs  every 4 hours if you must but call for immediate appointment if use goes up over your usual need - Don't leave home without it !!  (think of it like the spare tire for your car)   Note your daily symptoms > remember "red flags" for COPD:   >>>Increase in cough >>>increase in sputum production >>>increase in shortness of breath or activity  intolerance.   If you notice these symptoms, please call the office to be seen.     2. Former smoker  Continue to not smoke  Follow Up:    Return in about 4 months (around 09/21/2019), or if symptoms worsen or fail to improve, for Follow up with Dr. Vaughan Browner, Follow up with Wyn Quaker FNP-C.   Please do your part to reduce the spread of COVID-19:      Reduce your risk of any infection  and COVID19 by using the similar precautions used for avoiding the common cold or  flu:  Marland Kitchen Wash your hands often with soap and warm water for at least 20 seconds.  If soap and water are not readily available, use an alcohol-based hand sanitizer with at least 60% alcohol.  . If coughing or sneezing, cover your mouth and nose by coughing or sneezing into the elbow areas of your shirt or coat, into a tissue or into your sleeve (not your hands). [name] A MASK when in public  . Avoid shaking hands with others and consider head nods or verbal greetings only. . Avoid touching your eyes, nose, or mouth with unwashed hands.  . Avoid close contact with people who are sick. . Avoid places or events with large numbers of people in one location, like concerts or sporting events. . If you have some symptoms but not all symptoms, continue to monitor at home and seek medical attention if your symptoms worsen. . If you are having a medical emergency, call 911.   [place] / e-Visit: [email or web address]         [facility]: [facility]: [id number]                   [facility]: [phone]  It is flu season:   >>> Best ways to protect herself from the flu: Receive the yearly flu vaccine, practice good hand hygiene washing with soap and also using hand sanitizer when available, eat a nutritious meals, get adequate rest, hydrate appropriately   Please contact the office if your symptoms worsen or you have concerns that you are not improving.   Thank you for choosing [facility] for your healthcare, and for allowing [place] to partner with you on your healthcare journey. I am thankful to be able to provide care to you today.   Wyn Quaker FNP-C

## 2019-05-22 NOTE — Assessment & Plan Note (Signed)
Reviewed PFTs with patient and wife today Significant smoking history Elevated eosinophil counts in the past  Likely this is more asthma COPD overlap syndrome.  Plan: Continue taking Trelegy Ellipta 200 Slowly work on increasing your overall physical activity Follow-up in 4 months

## 2019-05-22 NOTE — Assessment & Plan Note (Signed)
Plan: Continue planned high-resolution CT chest in June/2021 as ordered by Dr. Vaughan Browner

## 2019-05-22 NOTE — Progress Notes (Signed)
@Patient  ID: [name], male    DOB: 10-29-1935, 84 y.o.   MRN: [id number]  Chief Complaint  Patient presents with  . Follow-up    Patient was given trial of Breztri but was switched to Trelegy and has been been doing good with that. Patient has a cough with clear phelgm. Patient states that he feels good overall.    Referring provider: Renne Crigler, NP  HPI:  84 year old male former smoker initially referred to our office on 01/27/2019.  This is status post a hospitalization October/2020 for lower extremity cellulitis, lower leg DVT, abnormal CT scan that showed a consolidation as well as a nodule.  PMH: Hypertension, dyslipidemia, CAD, arthritis, venous stasis, leg wounds, history of DVT (October/2020) Smoker/ Smoking History: Former smoker.  Quit 2006.  171-pack-year smoking history. Maintenance: Trelegy Ellipta 200 Pt of: Dr. Vaughan Browner  Pets: 3 dogs, cat.  No birds, farm animals Occupation: Retired Insurance account manager Exposures: Significant exposure to asbestos.  No mold, hot tub, Jacuzzi or down pillows or comforters Smoking history: 120-pack-year smoker.  Quit in 2005 Travel history: Originally lived in [place] and [place].  No significant recent travel Relevant family history: He was adopted and does not know of any family history   05/22/2019  - Visit   84 year old male former smoker followed in our office for asthma COPD overlap syndrome.  Patient has been doing quite well since last being seen 2 months ago.  He is currently maintained on Trelegy Ellipta 200.  He feels that this is helped him with his cough.  He denies any worsening shortness of breath.  He denies any acute or worsening symptoms such as increased sputum production, wheezing, discolored mucus.  Patient reports that he does do daily exercise every day.  He is working on improving his overall physical activity.  He is walking with a walker.  Patient presents today with his spouse who is interested in  additional information regarding COPD as well as what symptoms to watch the patient for as well as to discuss how this potentially could progress and how to best manage this.  We will discuss this today.  Questionaires / Pulmonary Flowsheets:   MMRC: mMRC Dyspnea Scale mMRC Score  05/22/2019 1  05/22/2019 2  03/23/2019 0    Tests:   Imaging: CT abdomen pelvis 04/09/2017-visualized lung bases do not show any abnormality  CT chest 11/19/2018-no PE, patchy airspace disease in the right lower lobe.  1.6 cm right upper lobe subsolid nodule, coronary, aortic atherosclerosis I have reviewed the images personally.  Labs: CBC 11/14/2018-WBC 11.7, eos 7%, absolute eosinophil count 819  03/23/2019-pulmonary function test-FVC 2.92 (60% predicted), postbronchodilator ratio 63, postbronchodilator FEV1 1.96 (56% predicted), no bronchodilator response, DLCO 18.68 (66% predicted)  FENO:  No results found for: NITRICOXIDE  PFT: PFT Results Latest Ref Rng & Units 03/23/2019  FVC-Pre L 2.92  FVC-Predicted Pre % 60  FVC-Post L 3.09  FVC-Predicted Post % 63  Pre FEV1/FVC % % 69  Post FEV1/FCV % % 63  FEV1-Pre L 2.01  FEV1-Predicted Pre % 57  FEV1-Post L 1.96  DLCO UNC% % 66  DLCO COR %Predicted % 92  TLC L 6.32  TLC % Predicted % 78  RV % Predicted % 113    WALK:  SIX MIN WALK 01/27/2019  Supplimental Oxygen during Test? (L/min) Yes  O2 Flow Rate 2  Type Pulse  Tech Comments: Pt. walked at a slow pace, dropped to 88 and was given 2L pulse  and recovered. ER    Imaging: No results found.  Lab Results:  CBC    Component Value Date/Time   WBC 12.6 (H) 11/20/2018 0753   RBC 3.83 (L) 11/20/2018 0753   HGB 12.2 (L) 01/09/2019 1133   HCT 36.0 (L) 01/09/2019 1133   PLT 552 (H) 11/20/2018 0753   MCV 93.5 11/20/2018 0753   MCH 29.8 11/20/2018 0753   MCHC 31.8 11/20/2018 0753   RDW 13.1 11/20/2018 0753   LYMPHSABS 0.5 (L) 11/20/2018 0753   MONOABS 0.5 11/20/2018 0753   EOSABS 0.0  11/20/2018 0753   BASOSABS 0.0 11/20/2018 0753    BMET    Component Value Date/Time   NA 139 01/09/2019 1133   K 4.3 01/09/2019 1133   CL 100 01/09/2019 1133   CO2 31 11/20/2018 0753   GLUCOSE 97 01/09/2019 1133   BUN 16 01/09/2019 1133   CREATININE 0.80 01/09/2019 1133   CREATININE 0.69 06/09/2012 0946   CALCIUM 8.5 (L) 11/20/2018 0753   GFRNONAA >60 11/20/2018 0753   GFRNONAA >89 06/09/2012 0946   GFRAA >60 11/20/2018 0753   GFRAA >89 06/09/2012 0946    BNP No results found for: BNP  ProBNP No results found for: PROBNP  Specialty Problems      Pulmonary Problems   COPD mixed type ([place])    03/23/2019-pulmonary function test-FVC 2.92 (60% predicted), postbronchodilator ratio 63, postbronchodilator FEV1 1.96 (56% predicted), no bronchodilator response, DLCO 18.68 (66% predicted)      Dyspnea   Lung nodule      Allergies  Allergen Reactions  . Sulfa Antibiotics Other (See Comments)    Nausea    Immunization History  Administered Date(s) Administered  . DTaP 07/12/2009  . Influenza Whole 11/09/2008  . Influenza, High Dose Seasonal PF 01/22/2016, 11/12/2016, 12/25/2017, 10/11/2018  . Influenza,inj,quad, With Preservative 10/11/2018  . PFIZER SARS-COV-2 Vaccination 04/02/2019, 04/26/2019  . Pneumococcal Conjugate-13 11/10/2007, 05/25/2016  . Pneumococcal Polysaccharide-23 05/25/2017  . Tdap 05/13/2009  . Zoster 05/13/2009    Past Medical History:  Diagnosis Date  . Arthritis   . CAD (coronary artery disease)    Abnormal stress perfusion study with apical and inferior wall ischemia. LAD had a long 99% stenosis followed by 30% stenosis, first diagonal 25% stenosis, circumflex luminal irregularities, second obtuse marginal 25% stenosis, RCA 40% followed by 40% stenosis. EF 60%. Stenting with 2 bare metal stents by Dr. Burt Knack to the LAD.  Marland Kitchen Colon cancer [facility])    Radiation, chemo, surgery 2011  . Complication of anesthesia    Pt states he coughs alot during  anesthesia  . COPD (chronic obstructive pulmonary disease) ([place])   . History of tobacco use   . Hypertension     Tobacco History: Social History   Tobacco Use  Smoking Status Former Smoker  . Packs/day: 3.00  . Years: 57.00  . Pack years: 171.00  . Quit date: 2006  . Years since quitting: 15.2  Smokeless Tobacco Never Used  Tobacco Comment   quit smoking 2006   Counseling given: Yes Comment: quit smoking 2006   Continue to not smoke  Outpatient Encounter Medications as of 05/22/2019  Medication Sig  . acetaminophen (TYLENOL) 325 MG tablet Take 650 mg by mouth every 6 (six) hours as needed.  Marland Kitchen albuterol (VENTOLIN HFA) 108 (90 Base) MCG/ACT inhaler Inhale 2 puffs into the lungs every 6 (six) hours as needed for wheezing or shortness of breath.  . ALBUTEROL IN albuterol sulf 90 mcg/actuation breath activated powder inhaler,sensor  Inhale 2 puffs every 6 hours by inhalation route.  Marland Kitchen amLODipine-benazepril (LOTREL) 10-40 MG per capsule Take 1 capsule by mouth daily. (Patient taking differently: Take 1 capsule by mouth at bedtime. )  . aspirin 81 MG chewable tablet Chew 81 mg by mouth every evening.  Marland Kitchen atorvastatin (LIPITOR) 20 MG tablet Take 1 tablet (20 mg total) by mouth every evening.  . benazepril (LOTENSIN) 40 MG tablet benazepril 40 mg tablet  Take 1 tablet every day by oral route.  . Calcium 150 MG TABS Take 1 tablet by mouth daily.  . Ferrous Gluconate (IRON) 240 (27 FE) MG TABS Take 240 mg by mouth daily.   . Fluticasone-Umeclidin-Vilant (TRELEGY ELLIPTA) 200-62.5-25 MCG/INH AEPB Inhale 1 puff into the lungs daily. Please remember to wash mouth out afterwards!  Marland Kitchen guaifenesin (ROBITUSSIN) 100 MG/5ML syrup Take 200 mg by mouth 3 (three) times daily as needed for cough.  . Influenza Vac High-Dose Quad (FLUZONE HIGH-DOSE QUADRIVALENT) 0.7 ML SUSY Fluzone High-Dose Quad 2020-21 (PF) 240 mcg/0.7 mL IM syringe  PHARMACIST ADMINISTERED IMMUNIZATION ADMINISTERED AT TIME OF  DISPENSING  . Lycopene 10 MG CAPS Take 10 mg by mouth daily.  . Magnesium 100 MG CAPS Take 1 capsule by mouth daily.  . metoprolol tartrate (LOPRESSOR) 100 MG tablet Take 1 tablet (100 mg total) by mouth at bedtime.  . nitroGLYCERIN (NITROSTAT) 0.4 MG SL tablet Place 1 tablet (0.4 mg total) under the tongue every 5 (five) minutes as needed. (Patient taking differently: Place 0.4 mg under the tongue every 5 (five) minutes as needed for chest pain. )  . tamsulosin (FLOMAX) 0.4 MG CAPS capsule Take 0.4 mg by mouth daily.  . Vitamin D, Cholecalciferol, 1000 UNITS TABS Take 1,000 Units by mouth 2 (two) times daily.  . Zinc Sulfate (ZINC 15 PO) Take 1 tablet by mouth daily.  . [DISCONTINUED] ELIQUIS 5 MG TABS tablet TAKE 2 TABLETS BY MOUTH TWICE DAILY FOR 7 DAYS THEN TAKE 1 TABLET BY MOUTH TWICE DAILY  . [DISCONTINUED] mometasone-formoterol (DULERA) 100-5 MCG/ACT AERO Dulera 100 mcg-5 mcg/actuation HFA aerosol inhaler  INHALE 2 PUFFS BY MOUTH TWICE DAILY   No facility-administered encounter medications on file as of 05/22/2019.     Review of Systems  Review of Systems  Constitutional: Negative for activity change, chills, fatigue, fever and unexpected weight change.  HENT: Negative for postnasal drip, rhinorrhea, sinus pressure, sinus pain and sore throat.   Eyes: Negative.   Respiratory: Negative for cough, shortness of breath and wheezing.   Cardiovascular: Negative for chest pain and palpitations.  Gastrointestinal: Negative for constipation, diarrhea, nausea and vomiting.  Endocrine: Negative.   Genitourinary: Negative.   Musculoskeletal: Negative.   Skin: Negative.   Neurological: Negative for dizziness and headaches.  Psychiatric/Behavioral: Negative.  Negative for dysphoric mood. The patient is not nervous/anxious.   All other systems reviewed and are negative.    Physical Exam  BP 112/60 (BP Location: Right Arm, Patient Position: Sitting, Cuff Size: Large)   Pulse (!) 52   Temp  97.6 F (36.4 C) (Temporal)   Ht 6\' 4"  (1.93 m)   Wt 255 lb 12.8 oz (116 kg)   SpO2 94%   BMI 31.14 kg/m   Wt Readings from Last 5 Encounters:  05/22/19 255 lb 12.8 oz (116 kg)  04/14/19 256 lb (116.1 kg)  03/23/19 256 lb (116.1 kg)  01/27/19 243 lb (110.2 kg)  01/09/19 240 lb (108.9 kg)    BMI Readings from Last 5 Encounters:  05/22/19  31.14 kg/m  04/14/19 32.00 kg/m  03/23/19 32.00 kg/m  01/27/19 29.58 kg/m  01/09/19 29.21 kg/m     Physical Exam Vitals and nursing note reviewed.  Constitutional:      General: He is not in acute distress.    Appearance: Normal appearance. He is obese.  HENT:     Head: Normocephalic and atraumatic.     Right Ear: Hearing, tympanic membrane, ear canal and external ear normal.     Left Ear: Hearing, tympanic membrane, ear canal and external ear normal.     Nose: Nose normal. No mucosal edema.     Right Turbinates: Not enlarged.     Left Turbinates: Not enlarged.     Mouth/Throat:     Mouth: Mucous membranes are dry.     Pharynx: Oropharynx is clear. No oropharyngeal exudate.  Eyes:     Pupils: Pupils are equal, round, and reactive to light.  Cardiovascular:     Rate and Rhythm: Normal rate and regular rhythm.     Pulses: Normal pulses.     Heart sounds: Normal heart sounds. No murmur.  Pulmonary:     Effort: Pulmonary effort is normal.     Breath sounds: No decreased breath sounds, wheezing or rales.     Comments: Diminished breath sounds in bases Abdominal:     General: Bowel sounds are normal. There is no distension.     Palpations: Abdomen is soft.     Tenderness: There is no abdominal tenderness.  Musculoskeletal:     Cervical back: Normal range of motion.     Right lower leg: Edema present.     Left lower leg: Edema present.     Comments: Wraps applied to both lower extremities, managed by home health and spouse, improved from last office visit  Lymphadenopathy:     Cervical: No cervical adenopathy.  Skin:     General: Skin is warm and dry.     Capillary Refill: Capillary refill takes less than 2 seconds.     Findings: No erythema or rash.  Neurological:     General: No focal deficit present.     Mental Status: He is alert and oriented to person, place, and time.     Motor: Weakness (In wheelchair) present.     Coordination: Coordination normal.     Gait: Gait abnormal (Walks with walker).  Psychiatric:        Mood and Affect: Mood normal.        Behavior: Behavior normal. Behavior is cooperative.        Thought Content: Thought content normal.        Judgment: Judgment normal.       Assessment & Plan:   COPD mixed type ([place]) Reviewed PFTs with patient and wife today Significant smoking history Elevated eosinophil counts in the past  Likely this is more asthma COPD overlap syndrome.  Plan: Continue taking Trelegy Ellipta 200 Slowly work on increasing your overall physical activity Follow-up in 4 months   Abnormal findings on diagnostic imaging of lung Plan: Continue planned high-resolution CT chest in June/2021 as ordered by Dr. Vaughan Browner  Former smoker Plan: Continue to not smoke Complete high-resolution CT chest as planned by Dr. Vaughan Browner in June/2021  Venous stasis ulcer ([place]) Bilateral lower extremities with wraps applied Seems improved from last office visit  Plan: Continue to work with wound therapy Continue to work with the vein and vascular   Lung nodule Complete high-resolution CT chest as ordered in June/2021  Return in about 4 months (around 09/21/2019), or if symptoms worsen or fail to improve, for Follow up with Dr. Vaughan Browner, Follow up with Wyn Quaker FNP-C.   Lauraine Rinne, NP 05/22/2019   This appointment required 34 minutes of patient care (this includes precharting, chart review, review of results, face-to-face care, etc.).

## 2019-05-22 NOTE — Assessment & Plan Note (Signed)
Plan: Continue to not smoke Complete high-resolution CT chest as planned by Dr. Vaughan Browner in June/2021

## 2019-05-22 NOTE — Assessment & Plan Note (Signed)
Complete high-resolution CT chest as ordered in June/2021

## 2019-05-22 NOTE — Assessment & Plan Note (Signed)
Bilateral lower extremities with wraps applied Seems improved from last office visit  Plan: Continue to work with wound therapy Continue to work with the vein and vascular

## 2019-05-23 ENCOUNTER — Encounter (HOSPITAL_BASED_OUTPATIENT_CLINIC_OR_DEPARTMENT_OTHER): Payer: Medicare Other | Attending: Internal Medicine | Admitting: Internal Medicine

## 2019-05-23 DIAGNOSIS — I2510 Atherosclerotic heart disease of native coronary artery without angina pectoris: Secondary | ICD-10-CM | POA: Diagnosis not present

## 2019-05-23 DIAGNOSIS — Z955 Presence of coronary angioplasty implant and graft: Secondary | ICD-10-CM | POA: Insufficient documentation

## 2019-05-23 DIAGNOSIS — Z86718 Personal history of other venous thrombosis and embolism: Secondary | ICD-10-CM | POA: Diagnosis not present

## 2019-05-23 DIAGNOSIS — Z7901 Long term (current) use of anticoagulants: Secondary | ICD-10-CM | POA: Diagnosis not present

## 2019-05-23 DIAGNOSIS — I890 Lymphedema, not elsewhere classified: Secondary | ICD-10-CM | POA: Insufficient documentation

## 2019-05-23 DIAGNOSIS — I87333 Chronic venous hypertension (idiopathic) with ulcer and inflammation of bilateral lower extremity: Secondary | ICD-10-CM | POA: Insufficient documentation

## 2019-05-23 DIAGNOSIS — I10 Essential (primary) hypertension: Secondary | ICD-10-CM | POA: Diagnosis not present

## 2019-05-23 DIAGNOSIS — L97328 Non-pressure chronic ulcer of left ankle with other specified severity: Secondary | ICD-10-CM | POA: Diagnosis not present

## 2019-05-23 DIAGNOSIS — L97818 Non-pressure chronic ulcer of other part of right lower leg with other specified severity: Secondary | ICD-10-CM | POA: Diagnosis present

## 2019-05-23 NOTE — Progress Notes (Signed)
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 within the target range for the patient.Marland Kitchen Appears in no distress. Vitals Time Taken: 11:14 AM, Height: 76 in, Weight: 227 lbs, BMI: 27.6, Temperature: 97.6 F, Pulse: 53 bpm, Respiratory Rate: 18 breaths/min, Blood Pressure: 136/56 mmHg. General Notes: Wound exam; there is no open wounds. He has a changes of chronic bilateral venous insufficiency. His pedal pulses are palpable edema control is good Integumentary (Hair, Skin) There is no particular rash other than the usual changes of chronic venous insufficiency in his bilateral lower legs. 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 Skin Barriers/Peri-Wound Care: Moisturizing lotion - both legs twice a day. Edema Control: Avoid standing for long periods of  time Elevate legs to the level of the heart or above for 30 minutes daily and/or when sitting, a frequency of: - throughout the day. Exercise regularly Support Garment 30-40 mm/Hg pressure to: - jobst farrow 4000 compression stockings on in the am off in the pm. 1. The patient does not have an open wound. 2. I am not really sure what caused the rash that the patient's wife is able to show me on her camera. I think it was probably some form of contact dermatitis. At first I thought this was a sunburn however they reminded me that he did not take the stockings off.  They are not going to use the Dial soap. That makes sense. They are continuing in the [place] wraps which we got for him the last time. This appears to be controlling his swelling secondary to the chronic venous insufficiency and lymphedema Electronic Signature(s) Signed: 05/23/2019 5:51:00 PM By: Linton Ham MD Entered By: Linton Ham on 05/23/2019 13:01:22 -------------------------------------------------------------------------------- SuperBill Details Patient Name: Date of Service: [name] 05/23/2019 Medical Record Number:[id number] Patient Account Number: [id number] Date of Birth/Sex: Treating RN: 01-05-1936 (84 y.o. [name] Primary Care Provider: Haynes [name] Other Clinician: Referring Provider: Treating Provider/Extender:Temiloluwa Recchia, [name], [name] in Treatment: 23 Diagnosis Coding ICD-10 Codes Code Description I87.333 Chronic venous hypertension (idiopathic) with ulcer and inflammation of bilateral lower extremity L97.818 Non-pressure chronic ulcer of other part of right lower leg with other specified severity L97.328 Non-pressure chronic ulcer of left ankle with other specified severity Facility Procedures The patient participates with Medicare or their insurance follows the Medicare Facility Guidelines: CPT4 Code Description Modifier Quantity [id number] [place] VISIT-LEV 3 EST PT  1 Physician Procedures CPT4: Description Modifier Quantity Code [id number] - WC PHYS LEVEL 2 - EST PT 1 ICD-10 Diagnosis Description I87.333 Chronic venous hypertension (idiopathic) with ulcer and inflammation of bilateral lower extremity L97.818 Non-pressure chronic ulcer  of other part of right lower leg with other specified severity L97.328 Non-pressure chronic ulcer of left ankle with other specified severity Electronic Signature(s) Signed: 05/23/2019 5:51:00 PM By: Linton Ham MD Entered By: Linton Ham on 05/23/2019 13:01:47

## 2019-05-24 NOTE — Progress Notes (Signed)
SOUL, COMPANION ([id number]) Visit Report for 05/02/2019 SuperBill Details Patient Name: Date of Service: KONA, BARTOK 05/02/2019 Medical Record Number:[id number] Patient Account Number: [id number] Date of Birth/Sex: Treating RN: [birth year] (84 y.o. [name] Primary Care Provider: Haynes Hoehn Other Clinician: Referring Provider: Treating Provider/Extender:Nazier Neyhart, [name], [name]: 20 Diagnosis Coding ICD-10 Codes Code Description I87.333 Chronic venous hypertension (idiopathic) with ulcer and inflammation of bilateral lower extremity L97.818 Non-pressure chronic ulcer of other part of right lower leg with other specified severity L97.328 Non-pressure chronic ulcer of left ankle with other specified severity Facility Procedures The patient participates with Medicare or their insurance follows the Medicare Facility Guidelines CPT4 Code Description Modifier Quantity [id number] (Facility Use Only) [phone] - [place] 1 Electronic Signature(s) Signed: 05/02/2019 5:53:09 PM By: Linton Ham MD Signed: 05/24/2019 9:02:47 AM By: Levan Hurst RN, BSN Entered By: Levan Hurst on 05/02/2019 15:27:18

## 2019-05-24 NOTE — Progress Notes (Signed)
[name], [name] ([id number]) Visit Report for 05/09/2019 Arrival Information Details Patient Name: Date of Service: [name], [name] 05/09/2019 12:30 PM Medical Record Number:[id number] Patient Account Number: [id number] Date of Birth/Sex: Treating RN: 1935-11-28 (83 y.o. [name]) Carlene Coria Primary Care Azalynn Maxim: Haynes Hoehn Other Clinician: Referring Zeplin Aleshire: Treating Kayln Garceau/Extender:[name], [name], [name]: 21 Visit Information History Since Last Visit Added or deleted any medications: No Patient Arrived: Wheel Chair Any new allergies or adverse reactions: No Arrival Time: 13:02 Had a fall or experienced change in No activities of daily living that may affect Accompanied By: wife risk of falls: Transfer Assistance: None Signs or symptoms of abuse/neglect since last No Patient Identification Verified: Yes visito Secondary Verification Process Completed: Yes Hospitalized since last visit: No Patient Requires Transmission-Based No Implantable device outside of the clinic excluding No Precautions: cellular tissue based products placed in the center Patient Has Alerts: No since last visit: Has Dressing in Place as Prescribed: Yes Pain Present Now: No Electronic Signature(s) Signed: 05/24/2019 9:21:08 AM By: Sandre Kitty Entered By: Sandre Kitty on 05/09/2019 13:02:58 -------------------------------------------------------------------------------- Clinic Level of Care Assessment Details Patient Name: Date of Service: [name] 05/09/2019 12:30 PM Medical Record Number:[id number] Patient Account Number: [id number] Date of Birth/Sex: Treating RN: 11/19/1935 (83 y.o. [name]) Carlene Coria Primary Care Dewey Neukam: Haynes Hoehn Other Clinician: Referring Shavawn Stobaugh: Treating Angline Schweigert/Extender:[name], [name], [name]: 21 Clinic Level of Care Assessment Items TOOL 4 Quantity Score X - Use when only an EandM is performed on FOLLOW-UP  visit 1 0 ASSESSMENTS - Nursing Assessment / Reassessment X - Reassessment of Co-morbidities (includes updates in patient status) 1 10 X - Reassessment of Adherence to Treatment Plan 1 5 ASSESSMENTS - Wound and Skin Assessment / Reassessment X - Simple Wound Assessment / Reassessment - one wound 1 5 []  - Complex Wound Assessment / Reassessment - multiple wounds 0 []  - Dermatologic / Skin Assessment (not related to wound area) 0 ASSESSMENTS - Focused Assessment []  - Circumferential Edema Measurements - multi extremities 0 []  - Nutritional Assessment / Counseling / Intervention 0 []  - Lower Extremity Assessment (monofilament, tuning fork, pulses) 0 []  - Peripheral Arterial Disease Assessment (using hand held doppler) 0 ASSESSMENTS - Ostomy and/or Continence Assessment and Care []  - Incontinence Assessment and Management 0 []  - Ostomy Care Assessment and Management (repouching, etc.) 0 PROCESS - Coordination of Care X - Simple Patient / Family Education for ongoing care 1 15 []  - Complex (extensive) Patient / Family Education for ongoing care 0 X - Staff obtains Consents, Records, Test Results / Process Orders 1 10 []  - Staff telephones HHA, Nursing Homes / Clarify orders / etc 0 []  - Routine Transfer to another Facility (non-emergent condition) 0 []  - Routine Hospital Admission (non-emergent condition) 0 []  - New Admissions / Biomedical engineer / Ordering NPWT, Apligraf, etc. 0 []  - Emergency Hospital Admission (emergent condition) 0 X - Simple Discharge Coordination 1 10 []  - Complex (extensive) Discharge Coordination 0 PROCESS - Special Needs []  - Pediatric / Minor Patient Management 0 []  - Isolation Patient Management 0 []  - Hearing / Language / Visual special needs 0 []  - Assessment of Community assistance (transportation, D/C planning, etc.) 0 []  - Additional assistance / Altered mentation 0 []  - Support Surface(s) Assessment (bed, cushion, seat, etc.) 0 INTERVENTIONS -  Wound Cleansing / Measurement X - Simple Wound Cleansing - one wound 1 5 []  - Complex Wound Cleansing - multiple wounds 0 X - Wound Imaging (photographs -  any number of wounds) 1 5 []  - Wound Tracing (instead of photographs) 0 X - Simple Wound Measurement - one wound 1 5 []  - Complex Wound Measurement - multiple wounds 0 INTERVENTIONS - Wound Dressings []  - Small Wound Dressing one or multiple wounds 0 []  - Medium Wound Dressing one or multiple wounds 0 []  - Large Wound Dressing one or multiple wounds 0 []  - Application of Medications - topical 0 []  - Application of Medications - injection 0 INTERVENTIONS - Miscellaneous []  - External ear exam 0 []  - Specimen Collection (cultures, biopsies, blood, body fluids, etc.) 0 []  - Specimen(s) / Culture(s) sent or taken to Lab for analysis 0 []  - Patient Transfer (multiple staff / Civil Service fast streamer / Similar devices) 0 []  - Simple Staple / Suture removal (25 or less) 0 []  - Complex Staple / Suture removal (26 or more) 0 []  - Hypo / Hyperglycemic Management (close monitor of Blood Glucose) 0 []  - Ankle / Brachial Index (ABI) - do not check if billed separately 0 X - Vital Signs 1 5 Has the patient been seen at the hospital within the last three years: Yes Total Score: 75 Level Of Care: New/Established - Level 2 Electronic Signature(s) Signed: 05/10/2019 5:40:01 PM By: Carlene Coria RN Entered By: Carlene Coria on 05/09/2019 13:55:09 -------------------------------------------------------------------------------- Encounter Discharge Information Details Patient Name: Date of Service: [name] 05/09/2019 12:30 PM Medical Record Number:[id number] Patient Account Number: [id number] Date of Birth/Sex: Treating RN: [birth year] (84 y.o. [name] Primary Care Raigan Baria: Haynes Hoehn Other Clinician: Referring Kaari Zeigler: Treating Jeneen Doutt/Extender:[name], [name], [name]: 21 Encounter Discharge Information  Items Discharge Condition: Stable Ambulatory Status: Wheelchair Discharge Destination: Home Transportation: Private Auto Accompanied By: wife Schedule Follow-up Appointment: Yes Clinical Summary of Care: Patient Declined Electronic Signature(s) Signed: 05/15/2019 4:55:48 PM By: Kela Millin Entered By: Kela Millin on 05/09/2019 15:54:43 -------------------------------------------------------------------------------- Lower Extremity Assessment Details Patient Name: Date of Service: [name], [name] 05/09/2019 12:30 PM Medical Record Number:[id number] Patient Account Number: [id number] Date of Birth/Sex: Treating RN: Jul 08, 1935 (84 y.o. [name] Primary Care Gabbie Marzo: Haynes Hoehn Other Clinician: Referring Takiesha Mcdevitt: Treating Safiyya Stokes/Extender:[name], [name], [name]: 21 Edema Assessment Assessed: [Left: No] [Right: No] Edema: [Left: Ye] [Right: s] Calf Left: Right: Point of Measurement: 45 cm From Medial Instep 38.5 cm cm Ankle Left: Right: Point of Measurement: 11 cm From Medial Instep 25.5 cm cm Vascular Assessment Pulses: Dorsalis Pedis Palpable: [Left:Yes] Electronic Signature(s) Signed: 05/10/2019 6:50:57 PM By: Baruch [name] RN, BSN Entered By: Baruch [name] on 05/09/2019 13:27:39 -------------------------------------------------------------------------------- Multi Wound Chart Details Patient Name: Date of Service: [name] 05/09/2019 12:30 PM Medical Record Number:[id number] Patient Account Number: [id number] Date of Birth/Sex: Treating RN: 1935/06/19 (83 y.o. [name]) Carlene Coria Primary Care Izick Gasbarro: Haynes Hoehn Other Clinician: Referring Suleyma Wafer: Treating Avaiyah Strubel/Extender:[name], [name], [name]: 21 Vital Signs Height(in): 76 Pulse(bpm): 12 Weight(lbs): 227 Blood Pressure(mmHg): 140/69 Body Mass Index(BMI): 28 Temperature(F): 97.7 Respiratory 18 Rate(breaths/min): Photos: [4:No  Photos] [N/A:N/A] Wound Location: [4:Left, Medial Malleolus] [N/A:N/A] Wounding Event: [4:Gradually Appeared] [N/A:N/A] Primary Etiology: [4:Venous Leg Ulcer] [N/A:N/A] Comorbid History: [4:Chronic Obstructive Pulmonary Disease (COPD), Coronary Artery Disease, Hypertension, Peripheral Venous Disease, Received Chemotherapy, Received Radiation] [N/A:N/A] Date Acquired: [4:03/12/2018] [N/A:N/A] Weeks of Treatment: [4:21] [N/A:N/A] Wound Status: [4:Healed - Epithelialized] [N/A:N/A] Measurements L x W x D 0x0x0 [N/A:N/A] (cm) Area (cm) : [4:0] [N/A:N/A] Volume (cm) : [4:0] [N/A:N/A] % Reduction in Area: [4:100.00%] [N/A:N/A] % Reduction in Volume: 100.00% [N/A:N/A] Classification: [  4:Full Thickness Without Exposed Support Structures] [N/A:N/A] Exudate Amount: [4:None Present] [N/A:N/A] Wound Margin: [4:Indistinct, nonvisible] [N/A:N/A] Granulation Amount: [4:None Present (0%)] [N/A:N/A] Necrotic Amount: [4:None Present (0%)] [N/A:N/A] Exposed Structures: [4:Fascia: No Fat Layer (Subcutaneous Tissue) Exposed: No Tendon: No Muscle: No Joint: No Bone: No Large (67-100%)] [N/A:N/A N/A] Treatment Notes Electronic Signature(s) Signed: 05/12/2019 4:56:09 PM By: Carlene Coria RN Signed: 05/15/2019 7:44:34 AM By: Linton Ham MD Entered By: Linton Ham on 05/11/2019 07:23:54 -------------------------------------------------------------------------------- Nunda Details Patient Name: Date of Service: [name] 05/09/2019 12:30 PM Medical Record Number:[id number] Patient Account Number: [id number] Date of Birth/Sex: Treating RN: 1935-11-28 (83 y.o. [name]) Carlene Coria Primary Care Darshay Deupree: Haynes Hoehn Other Clinician: Referring Tsugio Elison: Treating Kashius Dominic/Extender:[name], [name], [name]: 21 Active Inactive Electronic Signature(s) Signed: 05/10/2019 5:40:01 PM By: Carlene Coria RN Entered By: Carlene Coria on 05/09/2019  13:53:55 -------------------------------------------------------------------------------- Pain Assessment Details Patient Name: Date of Service: [name], [name] 05/09/2019 12:30 PM Medical Record Number:[id number] Patient Account Number: [id number] Date of Birth/Sex: Treating RN: 05-31-1935 (83 y.o. [name]) Carlene Coria Primary Care Precilla Purnell: Haynes Hoehn Other Clinician: Referring Havier Deeb: Treating Klyde Banka/Extender:[name], [name], [name]: 21 Active Problems Location of Pain Severity and Description of Pain Patient Has Paino No Site Locations Pain Management and Medication Current Pain Management: Electronic Signature(s) Signed: 05/10/2019 5:40:01 PM By: Carlene Coria RN Signed: 05/24/2019 9:21:08 AM By: Sandre Kitty Entered By: Sandre Kitty on 05/09/2019 13:05:22 -------------------------------------------------------------------------------- Patient/Caregiver Education Details Patient Name: Date of Service: [name] 3/30/2021andnbsp12:30 PM Medical Record Number:[id number] Patient Account Number: [id number] Date of Birth/Gender: Treating RN: [birth year] (83 y.o. [name]) Carlene Coria Primary Care Physician: Haynes Hoehn Other Clinician: Referring Physician: Treating Physician/Extender:[name], [name], Haynes Hoehn in Treatment: 21 Education Assessment Education Provided To: Patient Education Topics Provided Wound/Skin Impairment: Methods: Explain/Verbal Responses: State content correctly Electronic Signature(s) Signed: 05/10/2019 5:40:01 PM By: Carlene Coria RN Entered By: Carlene Coria on 05/09/2019 13:54:08 -------------------------------------------------------------------------------- Wound Assessment Details Patient Name: Date of Service: [name], [name] 05/09/2019 12:30 PM Medical Record Number:[id number] Patient Account Number: [id number] Date of Birth/Sex: Treating RN: [birth year] (83 y.o. [name]) Carlene Coria Primary Care Laneah Luft: Haynes Hoehn Other Clinician: Referring Keleigh Kazee: Treating Jamacia Jester/Extender:[name], [name], [name]: 21 Wound Status Wound Number: 4 Primary Venous Leg Ulcer Etiology: Wound Location: Left, Medial Malleolus Wound Healed - Epithelialized Wounding Event: Gradually Appeared Status: Date Acquired: 03/12/2018 Comorbid Chronic Obstructive Pulmonary Disease Weeks Of Treatment: 21 History: (COPD), Coronary Artery Disease, Clustered Wound: No Hypertension, Peripheral Venous Disease, Received Chemotherapy, Received Radiation Photos Photo Uploaded By: Mikeal Hawthorne on 05/12/2019 11:49:22 Wound Measurements Length: (cm) 0 % Reduc Width: (cm) 0 % Reduc Depth: (cm) 0 Epithel Area: (cm) 0 Tunnel Volume: (cm) 0 Underm Wound Description Classification: Full Thickness Without Exposed Support Foul Od Structures Slough/ Wound Indistinct, nonvisible Margin: Exudate None Present Amount: Wound Bed Granulation Amount: None Present (0%) Necrotic Amount: None Present (0%) Fascia Fat Lay Tendon Muscle Joint E Bone Ex Electronic Signature(s) Signed: 05/10/2019 5:40:01 PM By: Carlene Coria RN Entered By: Carlene Coria on 03/30/2 or After Cleansing: No Fibrino No Exposed Structure Exposed: No er (Subcutaneous Tissue) Exposed: No Exposed: No Exposed: No xposed: No posed: No 021 14:02:44 tion in Area: 100% tion in Volume: 100% ialization: Large (67-100%) ing: No ining: No -------------------------------------------------------------------------------- Vitals Details Patient Name: Date of Service: [name], [name] 05/09/2019 12:30 PM Medical Record Number:[id number] Patient Account Number: [id number] Date of Birth/Sex: Treating RN: [birth year] (83 y.o. [name]) Carlene Coria Primary Care  Ida Milbrath: Haynes Hoehn Other Clinician: Referring Attie Nawabi: Treating Tamula Morrical/Extender:[name], [name], [name]: 21 Vital Signs Time Taken: 13:03 Temperature  (F): 97.7 Height (in): 76 Pulse (bpm): 57 Weight (lbs): 227 Respiratory Rate (breaths/min): 18 Body Mass Index (BMI): 27.6 Blood Pressure (mmHg): 140/69 Reference Range: 80 - 120 mg / dl Electronic Signature(s) Signed: 05/24/2019 9:21:08 AM By: Sandre Kitty Entered By: Sandre Kitty on 05/09/2019 13:05:17

## 2019-05-24 NOTE — Progress Notes (Addendum)
[name] ([id number]) Visit Report for 05/23/2019 Arrival Information Details Patient Name: Date of Service: [name], [name]. 05/23/2019 10:30 A M Medical Record Number: [id number] Patient Account Number: [id number] Date of Birth/Sex: Treating RN: [birth year] (84 y.o. [name], [name] Primary Care Lizbeth Feijoo: Adelfa Koh Other Clinician: Referring Henrine Hayter: Treating Deserea Bordley/Extender: Berniece Pap Weeks in Treatment: 23 Visit Information History Since Last Visit Added or deleted any medications: No Patient Arrived: Wheel Chair Any new allergies or adverse reactions: No Arrival Time: 11:08 Had a fall or experienced change in No Accompanied By: wife activities of daily living that may affect Transfer Assistance: None risk of falls: Patient Identification Verified: Yes Signs or symptoms of abuse/neglect since last visito No Secondary Verification Process Completed: Yes Hospitalized since last visit: No Patient Requires Transmission-Based Precautions: No Implantable device outside of the clinic excluding No Patient Has Alerts: No cellular tissue based products placed in the center since last visit: Has Dressing in Place as Prescribed: Yes Pain Present Now: No Electronic Signature(s) Signed: 05/24/2019 9:19:04 AM By: Sandre Kitty Entered By: Sandre Kitty on 05/23/2019 11:14:32 -------------------------------------------------------------------------------- Clinic Level of Care Assessment Details Patient Name: Date of Service: [name], [name]. 05/23/2019 10:30 A M Medical Record Number: [id number] Patient Account Number: [id number] Date of Birth/Sex: Treating RN: 1935/07/11 (84 y.o. [name] Primary Care Xandrea Clarey: Adelfa Koh Other Clinician: Referring Jeanett Antonopoulos: Treating Draven Natter/Extender: Berniece Pap Weeks in Treatment: 23 Clinic Level of Care Assessment Items TOOL 4 Quantity Score X- 1 0 Use when only an EandM is  performed on FOLLOW-UP visit ASSESSMENTS - Nursing Assessment / Reassessment X- 1 10 Reassessment of Co-morbidities (includes updates in patient status) X- 1 5 Reassessment of Adherence to Treatment Plan ASSESSMENTS - Wound and Skin A ssessment / Reassessment []  - 0 Simple Wound Assessment / Reassessment - one wound []  - 0 Complex Wound Assessment / Reassessment - multiple wounds X- 1 10 Dermatologic / Skin Assessment (not related to wound area) ASSESSMENTS - Focused Assessment X- 2 5 Circumferential Edema Measurements - multi extremities X- 1 10 Nutritional Assessment / Counseling / Intervention []  - 0 Lower Extremity Assessment (monofilament, tuning fork, pulses) []  - 0 Peripheral Arterial Disease Assessment (using hand held doppler) ASSESSMENTS - Ostomy and/or Continence Assessment and Care []  - 0 Incontinence Assessment and Management []  - 0 Ostomy Care Assessment and Management (repouching, etc.) PROCESS - Coordination of Care X - Simple Patient / Family Education for ongoing care 1 15 []  - 0 Complex (extensive) Patient / Family Education for ongoing care X- 1 10 Staff obtains Consents, Records, T Results / Process Orders est []  - 0 Staff telephones HHA, Nursing Homes / Clarify orders / etc []  - 0 Routine Transfer to another Facility (non-emergent condition) []  - 0 Routine Hospital Admission (non-emergent condition) []  - 0 New Admissions / Biomedical engineer / Ordering NPWT Apligraf, etc. , []  - 0 Emergency Hospital Admission (emergent condition) X- 1 10 Simple Discharge Coordination []  - 0 Complex (extensive) Discharge Coordination PROCESS - Special Needs []  - 0 Pediatric / Minor Patient Management []  - 0 Isolation Patient Management []  - 0 Hearing / Language / Visual special needs []  - 0 Assessment of Community assistance (transportation, D/C planning, etc.) []  - 0 Additional assistance / Altered mentation []  - 0 Support Surface(s) Assessment  (bed, cushion, seat, etc.) INTERVENTIONS - Wound Cleansing / Measurement []  - 0 Simple Wound Cleansing - one wound []  - 0 Complex Wound Cleansing -  multiple wounds []  - 0 Wound Imaging (photographs - any number of wounds) []  - 0 Wound Tracing (instead of photographs) []  - 0 Simple Wound Measurement - one wound []  - 0 Complex Wound Measurement - multiple wounds INTERVENTIONS - Wound Dressings []  - 0 Small Wound Dressing one or multiple wounds []  - 0 Medium Wound Dressing one or multiple wounds []  - 0 Large Wound Dressing one or multiple wounds []  - 0 Application of Medications - topical []  - 0 Application of Medications - injection INTERVENTIONS - Miscellaneous []  - 0 External ear exam []  - 0 Specimen Collection (cultures, biopsies, blood, body fluids, etc.) []  - 0 Specimen(s) / Culture(s) sent or taken to Lab for analysis []  - 0 Patient Transfer (multiple staff / Civil Service fast streamer / Similar devices) []  - 0 Simple Staple / Suture removal (25 or less) []  - 0 Complex Staple / Suture removal (26 or more) []  - 0 Hypo / Hyperglycemic Management (close monitor of Blood Glucose) []  - 0 Ankle / Brachial Index (ABI) - do not check if billed separately X- 1 5 Vital Signs Has the patient been seen at the hospital within the last three years: Yes Total Score: 85 Level Of Care: New/Established - Level 3 Electronic Signature(s) Signed: 05/23/2019 6:03:05 PM By: Deon Pilling Entered By: Deon Pilling on 05/23/2019 11:42:13 -------------------------------------------------------------------------------- Encounter Discharge Information Details Patient Name: Date of Service: [name], [name]. 05/23/2019 10:30 A M Medical Record Number: [id number] Patient Account Number: [id number] Date of Birth/Sex: Treating RN: 27-May-1935 (84 y.o. [name] Primary Care Emmajane Altamura: Adelfa Koh Other Clinician: Referring Chadric Kimberley: Treating Emireth Cockerham/Extender: Camillo Flaming in  Treatment: 23 Encounter Discharge Information Items Discharge Condition: Stable Ambulatory Status: Wheelchair Discharge Destination: Home Transportation: Private Auto Accompanied By: wife Schedule Follow-up Appointment: No Clinical Summary of Care: Electronic Signature(s) Signed: 05/23/2019 6:03:05 PM By: Deon Pilling Entered By: Deon Pilling on 05/23/2019 11:42:49 -------------------------------------------------------------------------------- Lower Extremity Assessment Details Patient Name: Date of Service: [name], [name]. 05/23/2019 10:30 A M Medical Record Number: [id number] Patient Account Number: [id number] Date of Birth/Sex: Treating RN: 1935-10-09 (84 y.o. [name] Primary Care Nikkolas Coomes: Adelfa Koh Other Clinician: Referring Tanda Morrissey: Treating Shanetha Bradham/Extender: Berniece Pap Weeks in Treatment: 23 Edema Assessment Assessed: [Left: No] [Right: No] Edema: [Left: Ye] [Right: s] Calf Left: Right: Point of Measurement: 45 cm From Medial Instep 38.5 cm 36.2 cm Ankle Left: Right: Point of Measurement: 11 cm From Medial Instep 27 cm 25.4 cm Vascular Assessment Pulses: Dorsalis Pedis Palpable: [Left:Yes] [Right:Yes] Electronic Signature(s) Signed: 05/24/2019 5:43:43 PM By: Levan Hurst RN, BSN Entered By: Levan Hurst on 05/23/2019 11:30:34 -------------------------------------------------------------------------------- [place] Details Patient Name: Date of Service: [name], [name]. 05/23/2019 10:30 A M Medical Record Number: [id number] Patient Account Number: [id number] Date of Birth/Sex: Treating RN: January 07, 1936 (84 y.o. [name] Primary Care Lavel Rieman: Adelfa Koh Other Clinician: Referring Thaer Miyoshi: Treating Bryten Maher/Extender: Cheree Ditto, Michelle Nasuti Weeks in Treatment: 78 Active Inactive Electronic Signature(s) Signed: 06/21/2019 2:26:07 PM By: Levan Hurst RN, BSN Signed: 07/03/2019 1:31:05 PM  By: Deon Pilling Previous Signature: 05/23/2019 6:03:05 PM Version By: Deon Pilling Entered By: Levan Hurst on 06/09/2019 10:21:05 -------------------------------------------------------------------------------- Pain Assessment Details Patient Name: Date of Service: [name], [name]. 05/23/2019 10:30 A M Medical Record Number: [id number] Patient Account Number: [id number] Date of Birth/Sex: Treating RN: 09-21-1935 (84 y.o. [name] Primary Care Ketan Renz: Adelfa Koh Other Clinician: Referring Sarrinah Gardin: Treating Iyanna Drummer/Extender:  Cheree Ditto, SUSA N Weeks in Treatment: 23 Active Problems Location of Pain Severity and Description of Pain Patient Has Paino No Site Locations Pain Management and Medication Current Pain Management: Electronic Signature(s) Signed: 05/23/2019 6:03:05 PM By: Deon Pilling Signed: 05/24/2019 9:19:04 AM By: Sandre Kitty Entered By: Sandre Kitty on 05/23/2019 11:14:54 -------------------------------------------------------------------------------- Patient/Caregiver Education Details Patient Name: Date of Service: [name], [name]. 4/13/2021andnbsp10:30 A M Medical Record Number: [id number] Patient Account Number: [id number] Date of Birth/Gender: Treating RN: [birth year] (84 y.o. [name] Primary Care Physician: Adelfa Koh Other Clinician: Referring Physician: Treating Physician/Extender: Camillo Flaming in Treatment: 61 Education Assessment Education Provided To: Patient and Caregiver Education Topics Provided Pain: Handouts: A Guide to Pain Control Methods: Explain/Verbal Responses: Reinforcements needed Electronic Signature(s) Signed: 05/23/2019 6:03:05 PM By: Deon Pilling Entered By: Deon Pilling on 05/23/2019 10:45:28 -------------------------------------------------------------------------------- Vitals Details Patient Name: Date of Service: [name], [name]. 05/23/2019 10:30 A  M Medical Record Number: [id number] Patient Account Number: [id number] Date of Birth/Sex: Treating RN: Mar 15, 1935 (84 y.o. [name] Primary Care Manon Banbury: Adelfa Koh Other Clinician: Referring Simar Pothier: Treating Zakye Baby/Extender: Berniece Pap Weeks in Treatment: 23 Vital Signs Time Taken: 11:14 Temperature (F): 97.6 Height (in): 76 Pulse (bpm): 53 Weight (lbs): 227 Respiratory Rate (breaths/min): 18 Body Mass Index (BMI): 27.6 Blood Pressure (mmHg): 136/56 Reference Range: 80 - 120 mg / dl Electronic Signature(s) Signed: 05/24/2019 9:19:04 AM By: Sandre Kitty Entered By: Sandre Kitty on 05/23/2019 11:14:48

## 2019-05-24 NOTE — Progress Notes (Signed)
[name], [name] ([id number]) Visit Report for 04/04/2019 Arrival Information Details Patient Name: Date of Service: [name], [name] 04/04/2019 10:00 AM Medical Record Number:[id number] Patient Account Number: [id number] Date of Birth/Sex: Treating RN: 08-17-1935 (83 y.o. [name]) Carlene Coria Primary Care Vernisha Bacote: Haynes Hoehn Other Clinician: Referring Johann Santone: Treating Burney Calzadilla/Extender:[name], [name], [name]: 16 Visit Information History Since Last Visit Added or deleted any medications: No Patient Arrived: Wheel Chair Any new allergies or adverse reactions: No Arrival Time: 10:18 Had a fall or experienced change in No activities of daily living that may affect Accompanied By: wife risk of falls: Transfer Assistance: None Signs or symptoms of abuse/neglect since last No Patient Identification Verified: Yes visito Secondary Verification Process Completed: Yes Hospitalized since last visit: No Patient Requires Transmission-Based No Implantable device outside of the clinic excluding No Precautions: cellular tissue based products placed in the center Patient Has Alerts: No since last visit: Has Dressing in Place as Prescribed: Yes Pain Present Now: Yes Electronic Signature(s) Signed: 05/24/2019 9:23:26 AM By: Sandre Kitty Entered By: Sandre Kitty on 04/04/2019 10:19:11 -------------------------------------------------------------------------------- Clinic Level of Care Assessment Details Patient Name: Date of Service: [name], [name] 04/04/2019 10:00 AM Medical Record Number:[id number] Patient Account Number: [id number] Date of Birth/Sex: Treating RN: 08-07-1935 (83 y.o. [name]) Carlene Coria Primary Care Merwyn Hodapp: Haynes Hoehn Other Clinician: Referring Moselle Rister: Treating Casey Maxfield/Extender:[name], [name], [name]: 16 Clinic Level of Care Assessment Items TOOL 4 Quantity Score X - Use when only an EandM is performed on FOLLOW-UP  visit 1 0 ASSESSMENTS - Nursing Assessment / Reassessment X - Reassessment of Co-morbidities (includes updates in patient status) 1 10 X - Reassessment of Adherence to Treatment Plan 1 5 ASSESSMENTS - Wound and Skin Assessment / Reassessment X - Simple Wound Assessment / Reassessment - one wound 1 5 []  - Complex Wound Assessment / Reassessment - multiple wounds 0 []  - Dermatologic / Skin Assessment (not related to wound area) 0 ASSESSMENTS - Focused Assessment []  - Circumferential Edema Measurements - multi extremities 0 []  - Nutritional Assessment / Counseling / Intervention 0 []  - Lower Extremity Assessment (monofilament, tuning fork, pulses) 0 []  - Peripheral Arterial Disease Assessment (using hand held doppler) 0 ASSESSMENTS - Ostomy and/or Continence Assessment and Care []  - Incontinence Assessment and Management 0 []  - Ostomy Care Assessment and Management (repouching, etc.) 0 PROCESS - Coordination of Care X - Simple Patient / Family Education for ongoing care 1 15 []  - Complex (extensive) Patient / Family Education for ongoing care 0 X - Staff obtains Programmer, systems, Records, Test Results / Process Orders 1 10 []  - Staff telephones HHA, Nursing Homes / Clarify orders / etc 0 []  - Routine Transfer to another Facility (non-emergent condition) 0 []  - Routine Hospital Admission (non-emergent condition) 0 []  - New Admissions / Biomedical engineer / Ordering NPWT, Apligraf, etc. 0 []  - Emergency Hospital Admission (emergent condition) 0 X - Simple Discharge Coordination 1 10 []  - Complex (extensive) Discharge Coordination 0 PROCESS - Special Needs []  - Pediatric / Minor Patient Management 0 []  - Isolation Patient Management 0 []  - Hearing / Language / Visual special needs 0 []  - Assessment of Community assistance (transportation, D/C planning, etc.) 0 []  - Additional assistance / Altered mentation 0 []  - Support Surface(s) Assessment (bed, cushion, seat, etc.) 0 INTERVENTIONS -  Wound Cleansing / Measurement X - Simple Wound Cleansing - one wound 1 5 []  - Complex Wound Cleansing - multiple wounds 0 X - Wound Imaging (photographs -  any number of wounds) 1 5 []  - Wound Tracing (instead of photographs) 0 X - Simple Wound Measurement - one wound 1 5 []  - Complex Wound Measurement - multiple wounds 0 INTERVENTIONS - Wound Dressings X - Small Wound Dressing one or multiple wounds 1 10 []  - Medium Wound Dressing one or multiple wounds 0 []  - Large Wound Dressing one or multiple wounds 0 X - Application of Medications - topical 1 5 []  - Application of Medications - injection 0 INTERVENTIONS - Miscellaneous []  - External ear exam 0 []  - Specimen Collection (cultures, biopsies, blood, body fluids, etc.) 0 []  - Specimen(s) / Culture(s) sent or taken to Lab for analysis 0 []  - Patient Transfer (multiple staff / Civil Service fast streamer / Similar devices) 0 []  - Simple Staple / Suture removal (25 or less) 0 []  - Complex Staple / Suture removal (26 or more) 0 []  - Hypo / Hyperglycemic Management (close monitor of Blood Glucose) 0 []  - Ankle / Brachial Index (ABI) - do not check if billed separately 0 X - Vital Signs 1 5 Has the patient been seen at the hospital within the last three years: Yes Total Score: 90 Level Of Care: New/Established - Level 3 Electronic Signature(s) Signed: 04/04/2019 6:06:02 PM By: Carlene Coria RN Entered By: Carlene Coria on 04/04/2019 11:29:38 -------------------------------------------------------------------------------- Encounter Discharge Information Details Patient Name: Date of Service: [name] Boozer. 04/04/2019 10:00 AM Medical Record Number:[id number] Patient Account Number: [id number] Date of Birth/Sex: Treating RN: 1935-06-18 (84 y.o. [name] Primary Care Zaveon Gillen: Haynes Hoehn Other Clinician: Referring Rodrigues Urbanek: Treating Nesanel Aguila/Extender:[name], [name], [name]: 16 Encounter Discharge Information  Items Discharge Condition: Stable Ambulatory Status: Wheelchair Discharge Destination: Home Transportation: Private Auto Accompanied By: wife Schedule Follow-up Appointment: Yes Clinical Summary of Care: Patient Declined Electronic Signature(s) Signed: 04/04/2019 6:05:13 PM By: Kela Millin Entered By: Kela Millin on 04/04/2019 12:17:26 -------------------------------------------------------------------------------- Lower Extremity Assessment Details Patient Name: Date of Service: [name], [name] 04/04/2019 10:00 AM Medical Record Number:[id number] Patient Account Number: [id number] Date of Birth/Sex: Treating RN: [birth year] (84 y.o. [name] Primary Care Jamea Robicheaux: Haynes Hoehn Other Clinician: Referring Emmalin Jaquess: Treating Giovanne Nickolson/Extender:[name], [name], [name]: 16 Edema Assessment Assessed: [Left: No] [Right: No] Edema: [Left: Yes] [Right: Yes] Calf Left: Right: Point of Measurement: 45 cm From Medial Instep 43 cm 43.3 cm Ankle Left: Right: Point of Measurement: 11 cm From Medial Instep 30 cm 25 cm Vascular Assessment Pulses: Dorsalis Pedis Palpable: [Left:Yes] [Right:Yes] Electronic Signature(s) Signed: 04/06/2019 8:56:01 AM By: Levan Hurst RN, BSN Entered By: Levan Hurst on 04/04/2019 10:42:01 -------------------------------------------------------------------------------- Multi Wound Chart Details Patient Name: Date of Service: [name] Boozer 04/04/2019 10:00 AM Medical Record Number:[id number] Patient Account Number: [id number] Date of Birth/Sex: Treating RN: 1935/06/29 (83 y.o. [name]) Carlene Coria Primary Care Nicolasa Milbrath: Haynes Hoehn Other Clinician: Referring Trisa Cranor: Treating Habeeb Puertas/Extender:[name], [name], [name]: 16 Vital Signs Height(in): 76 Pulse(bpm): 46 Weight(lbs): 67 Blood Pressure(mmHg): 139/65 Body Mass Index(BMI): 28 Temperature(F): 97.7 Respiratory  18 Rate(breaths/min): Photos: [2:No Photos] [4:No Photos] [N/A:N/A] Wound Location: [2:Right, Lateral Lower Leg] [4:Left Malleolus - Medial] [N/A:N/A] Wounding Event: [2:Gradually Appeared] [4:Gradually Appeared] [N/A:N/A] Primary Etiology: [2:Venous Leg Ulcer] [4:Venous Leg Ulcer] [N/A:N/A] Comorbid History: [2:Chronic Obstructive Pulmonary Disease (COPD), Coronary Artery (COPD), Coronary Artery Disease, Hypertension, Peripheral Venous Disease, Peripheral Venous Disease, Received Chemotherapy, Received Chemotherapy, Received Radiation]  [4:Chronic Obstructive Pulmonary Disease Disease, Hypertension, Received Radiation] [N/A:N/A] Date Acquired: [2:03/12/2018] [4:03/12/2018] [N/A:N/A] Weeks of Treatment: [2:16] [4:16] [N/A:N/A] Wound Status: [  2:Healed - Epithelialized] [4:Open] [N/A:N/A] Clustered Wound: [2:Yes] [4:No] [N/A:N/A] Clustered Quantity: [2:2] [4:N/A] [N/A:N/A] Measurements L x W x D 0x0x0 [4:0.6x1x0.1] [N/A:N/A] (cm) Area (cm) : [2:0] [4:0.471] [N/A:N/A] Volume (cm) : [2:0] [4:0.047] [N/A:N/A] % Reduction in Area: [2:100.00%] [4:93.60%] [N/A:N/A] % Reduction in Volume: 100.00% [4:96.80%] [N/A:N/A] Classification: [2:Full Thickness Without Exposed Support Structures Exposed Support Structures] [4:Full Thickness Without] [N/A:N/A] Exudate Amount: [2:None Present] [4:Medium] [N/A:N/A] Exudate Type: [2:N/A] [4:Serosanguineous] [N/A:N/A] Exudate Color: [2:N/A] [4:red, brown] [N/A:N/A] Wound Margin: [2:Distinct, outline attached Distinct, outline attached N/A] Granulation Amount: [2:None Present (0%)] [4:Small (1-33%)] [N/A:N/A] Granulation Quality: [2:N/A] [4:Pink] [N/A:N/A] Necrotic Amount: [2:None Present (0%)] [4:Large (67-100%)] [N/A:N/A] Exposed Structures: [2:Fascia: No Fat Layer (Subcutaneous Tissue) Exposed: No Tendon: No Muscle: No Joint: No Bone: No Large (67-100%)] [4:Fat Layer (Subcutaneous Tissue) Exposed: Yes Fascia: No Tendon: No Muscle: No Joint: No Bone: No Small  (1-33%)] [N/A:N/A N/A] Treatment Notes Electronic Signature(s) Signed: 04/04/2019 6:00:34 PM By: Linton Ham MD Signed: 04/04/2019 6:06:02 PM By: Carlene Coria RN Entered By: Linton Ham on 04/04/2019 11:26:38 -------------------------------------------------------------------------------- [place] Details Patient Name: Date of Service: [name] Boozer. 04/04/2019 10:00 AM Medical Record Number:[id number] Patient Account Number: [id number] Date of Birth/Sex: Treating RN: [birth year] (83 y.o. [name]) Dolores Lory, [name] Hummingbird Primary Care Spirit Wernli: Haynes Hoehn Other Clinician: Referring Leeasia Secrist: Treating Obaloluwa Delatte/Extender:[name], [name], [name]: 16 Active Inactive Wound/Skin Impairment Nursing Diagnoses: Impaired tissue integrity Knowledge deficit related to smoking impact on wound healing Knowledge deficit related to ulceration/compromised skin integrity Goals: Patient/caregiver will verbalize understanding of skin care regimen Date Initiated: 02/23/2019 Target Resolution Date: 04/21/2019 Goal Status: Active Ulcer/skin breakdown will have a volume reduction of 30% by week 4 Target Resolution Date Initiated: 12/12/2018 Date Inactivated: 02/09/2019 Date: 02/03/2019 Goal Status: Met Interventions: Provide education on ulcer and skin care Notes: Electronic Signature(s) Signed: 04/04/2019 6:06:02 PM By: Carlene Coria RN Entered By: Carlene Coria on 04/04/2019 10:47:42 -------------------------------------------------------------------------------- Pain Assessment Details Patient Name: Date of Service: [name], [name] 04/04/2019 10:00 AM Medical Record Number:[id number] Patient Account Number: [id number] Date of Birth/Sex: Treating RN: 1935-11-04 (83 y.o. [name]) Carlene Coria Primary Care Lasaro Primm: Haynes Hoehn Other Clinician: Referring Anett Ranker: Treating Poppi Scantling/Extender:[name], [name], [name]: 16 Active Problems Location of  Pain Severity and Description of Pain Patient Has Paino Yes Site Locations Rate the pain. Current Pain Level: 3 Pain Management and Medication Current Pain Management: Electronic Signature(s) Signed: 04/04/2019 6:06:02 PM By: Carlene Coria RN Signed: 05/24/2019 9:23:26 AM By: Sandre Kitty Entered By: Sandre Kitty on 04/04/2019 10:21:38 -------------------------------------------------------------------------------- Patient/Caregiver Education Details Patient Name: Date of Service: [name], [name] 2/23/2021andnbsp10:00 AM Medical Record Number:[id number] Patient Account Number: [id number] Date of Birth/Gender: August 28, 1935 (83 y.o. M) Treating RN: Carlene Coria Primary Care Physician: Haynes Hoehn Other Clinician: Referring Physician: Treating Physician/Extender:[name], [name], Haynes Hoehn in Treatment: 16 Education Assessment Education Provided To: Patient Education Topics Provided Wound/Skin Impairment: Methods: Explain/Verbal Responses: State content correctly Electronic Signature(s) Signed: 04/04/2019 6:06:02 PM By: Carlene Coria RN Entered By: Carlene Coria on 04/04/2019 10:47:57 -------------------------------------------------------------------------------- Wound Assessment Details Patient Name: Date of Service: [name], [name] 04/04/2019 10:00 AM Medical Record Number:[id number] Patient Account Number: [id number] Date of Birth/Sex: Treating RN: [birth year] (84 y.o. [name] Primary Care Trayveon Beckford: Haynes Hoehn Other Clinician: Referring Yoneko Talerico: Treating Tamberly Pomplun/Extender:[name], [name], [name]: 16 Wound Status Wound Number: 2 Primary Venous Leg Ulcer Etiology: Wound Location: Right Lower Leg - Lateral Wound Healed - Epithelialized Wounding Event: Gradually Appeared Status: Date Acquired: 03/12/2018 Comorbid Chronic Obstructive  Pulmonary Disease Weeks Of Treatment: 16 History: (COPD), Coronary Artery Disease, Clustered  Wound: Yes Hypertension, Peripheral Venous Disease, Received Chemotherapy, Received Radiation Photos Wound Measurements Length: (cm) 0 % Reducti Width: (cm) 0 % Reducti Depth: (cm) 0 Epithel Clustered Quantity: 2 Tunneli Area: (cm) 0 Underm Volume: (cm) 0 Wound Description Full Thickness Without Exposed Support Classification: Structures Wound Distinct, outline attached Margin: Exudate None Present Amount: Wound Bed Granulation Amount: None Present (0%) Necrotic Amount: None Present (0%) Foul Odor After Cleansing: No Slough/Fibrino No Exposed Structure Fascia Exposed: No Fat Layer (Subcutaneous Tissue) Exposed: No Tendon Exposed: No Muscle Exposed: No Joint Exposed: No Bone Exposed: No on in Area: 100% on in Volume: 100% ialization: Large (67-100%) ng: No ining: No Electronic Signature(s) Signed: 04/04/2019 5:16:59 PM By: Mikeal Hawthorne EMT/HBOT Signed: 04/06/2019 8:56:01 AM By: Levan Hurst RN, BSN Entered By: Mikeal Hawthorne on 04/04/2019 14:24:49 -------------------------------------------------------------------------------- Wound Assessment Details Patient Name: Date of Service: [name] Boozer 04/04/2019 10:00 AM Medical Record Number:[id number] Patient Account Number: [id number] Date of Birth/Sex: Treating RN: Dec 05, 1935 (83 y.o. [name]) Carlene Coria Primary Care Eldor Conaway: Haynes Hoehn Other Clinician: Referring Nalaysia Manganiello: Treating Keena Heesch/Extender:[name], [name], [name]: 16 Wound Status Wound Number: 4 Primary Venous Leg Ulcer Etiology: Wound Location: Left Malleolus - Medial Wound Open Wounding Event: Gradually Appeared Status: Date Acquired: 03/12/2018 Comorbid Chronic Obstructive Pulmonary Disease Weeks Of Treatment: 16 History: (COPD), Coronary Artery Disease, Clustered Wound: No Hypertension, Peripheral Venous Disease, Received Chemotherapy, Received Radiation Photos Wound Measurements Length: (cm) 0.6 % Reduc Width:  (cm) 1 % Reduc Depth: (cm) 0.1 Epithel Area: (cm) 0.471 Tunnel Volume: (cm) 0.047 Underm Wound Description Classification: Full Thickness Without Exposed Support Foul O Structures Slough Wound Distinct, outline attached Margin: Exudate Medium Amount: Exudate Serosanguineous Type: Exudate red, brown Color: Wound Bed Granulation Amount: Small (1-33%) Granulation Quality: Pink Fascia Necrotic Amount: Large (67-100%) Fat Lay Necrotic Quality: Adherent Slough Tendon Muscle Joint E Bone Ex dor After Cleansing: No /Fibrino Yes Exposed Structure Exposed: No er (Subcutaneous Tissue) Exposed: Yes Exposed: No Exposed: No xposed: No posed: No tion in Area: 93.6% tion in Volume: 96.8% ialization: Small (1-33%) ing: No ining: No Electronic Signature(s) Signed: 04/04/2019 5:16:59 PM By: Mikeal Hawthorne EMT/HBOT Signed: 04/04/2019 6:06:02 PM By: Carlene Coria RN Entered By: Mikeal Hawthorne on 04/04/2019 14:24:22 -------------------------------------------------------------------------------- Vitals Details Patient Name: Date of Service: [name] Boozer. 04/04/2019 10:00 AM Medical Record Number:[id number] Patient Account Number: [id number] Date of Birth/Sex: Treating RN: 1935/04/21 (83 y.o. [name]) Dolores Lory, [place] Primary Care Caylin Nass: Haynes Hoehn Other Clinician: Referring Rozella Servello: Treating Glendy Barsanti/Extender:[name], [name], [name]: 16 Vital Signs Time Taken: 10:21 Temperature (F): 97.7 Height (in): 76 Pulse (bpm): 56 Weight (lbs): 227 Respiratory Rate (breaths/min): 18 Body Mass Index (BMI): 27.6 Blood Pressure (mmHg): 139/65 Reference Range: 80 - 120 mg / dl Electronic Signature(s) Signed: 05/24/2019 9:23:26 AM By: Sandre Kitty Entered By: Sandre Kitty on 04/04/2019 10:21:34

## 2019-05-24 NOTE — Progress Notes (Signed)
[name], [name] ([id number]) Visit Report for 05/02/2019 Arrival Information Details Patient Name: Date of Service: [name], [name] 05/02/2019 1:45 PM Medical Record Number:[id number] Patient Account Number: [id number] Date of Birth/Sex: Treating RN: [name][birth year] (84 y.o. [name] Primary Care [name]: [name] Other Clinician: Referring [name]: Treating [name]/Extender:[name]: 20 Visit Information History Since Last Visit Added or deleted any medications: No Patient Arrived: Wheel Chair Any new allergies or adverse reactions: No Arrival Time: 14:24 Had a fall or experienced change in No activities of daily living that [name] affect Accompanied By: wife risk of falls: Transfer Assistance: None Signs or symptoms of abuse/neglect since last No Patient Identification Verified: Yes visito Secondary Verification Process Yes Hospitalized since last visit: No Completed: Implantable device outside of the clinic excluding No Patient Requires Transmission-Based No cellular tissue based products placed in the center Precautions: since last visit: Patient Has Alerts: No Has Dressing in Place as Prescribed: Yes Has Compression in Place as Prescribed: Yes Pain Present Now: No Electronic Signature(s) Signed: 05/24/2019 9:02:47 AM By: [name] Hurst RN, BSN Entered By: [name] on 05/02/2019 15:24:13 -------------------------------------------------------------------------------- Compression Therapy Details Patient Name: Date of Service: [name] 05/02/2019 1:45 PM Medical Record Number:[id number] Patient Account Number: [id number] Date of Birth/Sex: Treating RN: [birth year][name] (84 y.o. [name] Primary Care Melaney Tellefsen: [name] Other Clinician: Referring [name]: Treating [name]/Extender:[name]: 20 Compression Therapy Performed for Wound Wound #4 Left,Medial  Malleolus Assessment: Performed By: Clinician [name] Hurst, RN Compression Type: Four Layer Electronic Signature(s) Signed: 05/24/2019 9:02:47 AM By: [name] Hurst RN, BSN Entered By: [name] on 05/02/2019 15:25:09 -------------------------------------------------------------------------------- Encounter Discharge Information Details Patient Name: Date of Service: [name] 05/02/2019 1:45 PM Medical Record Number:[id number] Patient Account Number: [id number] Date of Birth/Sex: Treating RN: [name] (84 y.o. [name] Primary Care Maxine Fredman: [name] Other Clinician: Referring [name]: Treating [name]/Extender:[name]: 20 Encounter Discharge Information Items Discharge Condition: Stable Ambulatory Status: Wheelchair Discharge Destination: Home Transportation: Private Auto Accompanied By: wife Schedule Follow-up Appointment: Yes Clinical Summary of Care: Patient Declined Electronic Signature(s) Signed: 05/24/2019 9:02:47 AM By: [name] Hurst RN, BSN Entered By: [name] on 05/02/2019 15:27:09 -------------------------------------------------------------------------------- Patient/Caregiver Education Details Patient Name: Date of Service: [name] 3/23/2021andnbsp1:45 PM Medical Record Number:[id number] Patient Account Number: [id number] Date of Birth/Gender: Treating RN: [name] (84 y.o. [name] Primary Care Physician: [name] Other Clinician: Referring Physician: Treating Physician/Extender:[name], [name], [name] in [name]: 20 Education Assessment Education Provided To: Patient Education Topics Provided Wound/Skin Impairment: Methods: Explain/Verbal Responses: State content correctly Electronic Signature(s) Signed: 05/24/2019 9:02:47 AM By: [name] Hurst RN, BSN Entered By: [name] on 05/02/2019  15:26:57 -------------------------------------------------------------------------------- Wound Assessment Details Patient Name: Date of Service: [name] 05/02/2019 1:45 PM Medical Record Number:[id number] Patient Account Number: [id number] Date of Birth/Sex: Treating RN: [birth year] (84 y.o. [name] Primary Care Madinah Quarry: [name] Other Clinician: Referring Dametrius Sanjuan: Treating Ambri Miltner/Extender:[name]: 20 Wound Status Wound Number: 4 Primary Venous Leg Ulcer Etiology: Wound Location: Left Malleolus - Medial Wound Open Wounding Event: Gradually Appeared Status: Date Acquired: 03/12/2018 Comorbid Chronic Obstructive Pulmonary Disease Weeks Of [name]: 20 History: (COPD), Coronary Artery Disease, Clustered Wound: No Hypertension, Peripheral Venous Disease, Received Chemotherapy, Received Radiation Wound Measurements Length: (cm) 0.9 % Reduct Width: (cm) 4.2 % Reduct Depth: (cm) 0.1 Epitheli Area: (cm) 2.969 Tunneli Volume: (cm) 0.297 Undermi Wound Description Classification: Full  Thickness Without Exposed Support Foul Od Structures Slough/ Wound Indistinct, nonvisible Margin: Exudate Medium Amount: Exudate Serous Type: Exudate amber Color: Wound Bed Granulation Amount: Large (67-100%) Granulation Quality: Pink, Hyper-granulation Fascia Necrotic Amount: Small (1-33%) Fat Lay Necrotic Quality: Adherent Slough Tendon Muscle Joint E Bone Exposed or After Cleansing: No Fibrino Yes Exposed Structure Exposed: No er (Subcutaneous Tissue) Exposed: Yes Exposed: No Exposed: No xposed: No : No ion in Area: 59.8% ion in Volume: 79.9% alization: Medium (34-66%) ng: No ning: No Electronic Signature(s) Signed: 05/24/2019 9:02:47 AM By: [name] Hurst RN, BSN Entered By: [name] on 05/02/2019 15:24:44 -------------------------------------------------------------------------------- [place]  Details Patient Name: Date of Service: [name] 05/02/2019 1:45 PM Medical Record Number:[id number] Patient Account Number: [id number] Date of Birth/Sex: Treating RN: [birth year] [name] (84 y.o. [name] Primary Care Sharmila Wrobleski: [name] Other Clinician: Referring Kilo Eshelman: Treating Jaquita Bessire/Extender:[name]: 20 Vital Signs Time Taken: 14:26 Temperature (F): 98.1 Height (in): 76 Pulse (bpm): 72 Weight (lbs): 227 Respiratory Rate (breaths/min): 18 Body Mass Index (BMI): 27.6 Blood Pressure (mmHg): 131/67 Reference Range: 80 - 120 mg / dl Electronic Signature(s) Signed: 05/24/2019 9:02:47 AM By: [name] Hurst RN, BSN Entered By: [name] on 05/02/2019 15:24:27

## 2019-05-24 NOTE — Progress Notes (Signed)
[name], [name] ([id number]) Visit Report for 04/25/2019 Arrival Information Details Patient Name: Date of Service: [name] 04/25/2019 10:00 AM Medical Record Number:[id number] Patient Account Number: [id number] Date of Birth/Sex: Treating RN: 1935/09/10 (84 y.o. [name]) [name] Primary Care [name]: [name] Other Clinician: Referring [name]: Treating [name]/Extender:[name], [name] Weeks in [name]: 19 Visit Information History Since Last Visit Added or deleted any medications: No Patient Arrived: Wheel Chair Any new allergies or adverse reactions: No Arrival Time: 10:35 Had a fall or experienced change in No activities of daily living that may affect Accompanied By: wife risk of falls: Transfer Assistance: None Signs or symptoms of abuse/neglect since last No Patient Identification Verified: Yes visito Secondary Verification Process Completed: Yes Hospitalized since last visit: No Patient Requires Transmission-Based No Implantable device outside of the clinic excluding No Precautions: cellular tissue based products placed in the center Patient Has Alerts: No since last visit: Has Dressing in Place as Prescribed: Yes Pain Present Now: No Electronic Signature(s) Signed: 05/24/2019 9:21:08 AM By: [name] Entered By: [name] on 04/25/2019 10:35:33 -------------------------------------------------------------------------------- Compression Therapy Details Patient Name: Date of Service: [name] 04/25/2019 10:00 AM Medical Record Number:[id number] Patient Account Number: [id number] Date of Birth/Sex: Treating RN: 02/02/1936 (83 y.o. [name]) [name] Primary Care Ej Pinson: [name] Other Clinician: Referring [name]: Treating Martika Egler/Extender:[name], [name] Weeks in [name]: 19 Compression Therapy Performed for Wound Wound #4 Left,Medial Malleolus Assessment: Performed By: [name] Church,  RN Compression Type: Four Layer Post Procedure Diagnosis Same as Pre-procedure Electronic Signature(s) Signed: 04/26/2019 8:57:53 AM By: [name] Coria RN Entered By: [name] on 04/25/2019 11:18:55 -------------------------------------------------------------------------------- Encounter Discharge Information Details Patient Name: Date of Service: [name]. 04/25/2019 10:00 AM Medical Record Number:[id number] Patient Account Number: [id number] Date of Birth/Sex: Treating RN: 1935/12/26 (84 y.o. [name] Primary Care Samuel Mcpeek: [name] Other Clinician: Referring [name]: Treating [name]/Extender:[name], [name] Weeks in [name]: 63 Encounter Discharge Information Items Discharge Condition: Stable Ambulatory Status: Wheelchair Discharge Destination: Home Transportation: Private Auto Accompanied By: wife Schedule Follow-up Appointment: Yes Clinical Summary of Care: Patient Declined Electronic Signature(s) Signed: 04/25/2019 5:07:08 PM By: [name] Entered By: [name] on 04/25/2019 11:45:35 -------------------------------------------------------------------------------- Lower Extremity Assessment Details Patient Name: Date of Service: [name], [name] 04/25/2019 10:00 AM Medical Record Number:[id number] Patient Account Number: [id number] Date of Birth/Sex: Treating RN: 07-11-1935 (84 y.o. [name] Primary Care [name]: [name] Other Clinician: Referring Caeleigh Prohaska: Treating [name]:[name], [name] Weeks in [name]: 19 Edema Assessment Assessed: [Left: No] [Right: No] Edema: [Left: Ye] [Right: s] Calf Left: Right: Point of Measurement: 45 cm From Medial Instep 39.5 cm cm Ankle Left: Right: Point of Measurement: 11 cm From Medial Instep 28 cm cm Vascular Assessment Pulses: Dorsalis Pedis Palpable: [Left:Yes] Electronic Signature(s) Signed: 05/01/2019 5:18:21 PM By: [name] Entered By: [name] on 04/25/2019 10:51:18 -------------------------------------------------------------------------------- Multi Wound Chart Details Patient Name: Date of Service: [name] 04/25/2019 10:00 AM Medical Record Number:[id number] Patient Account Number: [id number] Date of Birth/Sex: Treating RN: December 30, 1935 (83 y.o. [name]) [name] Primary Care [name]: [name] Other Clinician: Referring [name]: Treating [name]/Extender:[name], [name] Weeks in [name]: 19 Vital Signs Height(in): 76 Pulse(bpm): 52 Weight(lbs): 227 Blood Pressure(mmHg): 148/78 Body Mass Index(BMI): 28 Temperature(F): 97.7 Respiratory 18 Rate(breaths/min): Photos: [4:No Photos] [N/A:N/A] Wound Location: [4:Left Malleolus - Medial] [N/A:N/A] Wounding Event: [4:Gradually Appeared] [N/A:N/A] Primary Etiology: [4:Venous Leg Ulcer] [N/A:N/A] Comorbid History: [4:Chronic Obstructive Pulmonary Disease (COPD), Coronary Artery Disease,  Hypertension, Peripheral Venous Disease, Received Chemotherapy, Received Radiation] [N/A:N/A] Date Acquired: [4:03/12/2018] [N/A:N/A] Weeks of [name]: [4:19] [N/A:N/A] Wound Status: [4:Open] [N/A:N/A] Measurements L x W x D 2x5x0.1 [N/A:N/A] (cm) Area (cm) : [4:7.854] [N/A:N/A] Volume (cm) : [4:0.785] [N/A:N/A] % Reduction in Area: [4:-6.40%] [N/A:N/A] % Reduction in Volume: [4:46.90%] [N/A:N/A] Classification: [4:Full Thickness Without Exposed Support Structures] [N/A:N/A] Exudate Amount: [4:Medium] [N/A:N/A] Exudate Type: [4:Serous] [N/A:N/A] Exudate Color: [4:amber] [N/A:N/A] Wound Margin: [4:Indistinct, nonvisible] [N/A:N/A] Granulation Amount: [4:Large (67-100%)] [N/A:N/A] Granulation Quality: [4:Pink] [N/A:N/A] Necrotic Amount: [4:Small (1-33%)] [N/A:N/A] Exposed Structures: [4:Fat Layer (Subcutaneous N/A Tissue) Exposed: Yes Fascia: No Tendon: No Muscle: No Joint: No Bone: No] Epithelialization: [4:Medium (34-66%)  Compression Therapy] [N/A:N/A N/A] [name] Notes Wound #4 (Left, Medial Malleolus) 1. Cleanse With Wound Cleanser Soap and water 2. Periwound Care Moisturizing lotion TCA Cream 3. Primary Dressing Applied Calcium Alginate Ag 4. Secondary Dressing ABD Pad Foam 6. Support Layer Applied 4 layer compression wrap Notes Environmental health practitioner) Signed: 04/25/2019 5:29:05 PM By: Linton Ham MD Signed: 04/26/2019 8:57:53 AM By: [name] Coria RN Entered By: Linton Ham on 04/25/2019 11:53:19 -------------------------------------------------------------------------------- [place] Details Patient Name: Date of Service: [name]. 04/25/2019 10:00 AM Medical Record Number:[id number] Patient Account Number: [id number] Date of Birth/Sex: Treating RN: 1935/09/01 (83 y.o. [name]) [name] Primary Care Genine Beckett: [name] Other Clinician: Referring Rhyan Wolters: Treating Anelly Samarin/Extender:[name], [name] Weeks in [name]: 19 Active Inactive Wound/Skin Impairment Nursing Diagnoses: Impaired tissue integrity Knowledge deficit related to smoking impact on wound healing Knowledge deficit related to ulceration/compromised skin integrity Goals: Patient/caregiver will verbalize understanding of skin care regimen Date Initiated: 02/23/2019 Target Resolution Date: 04/21/2019 Goal Status: Active Ulcer/skin breakdown will have a volume reduction of 30% by week 4 Target Resolution Date Initiated: 12/12/2018 Date Inactivated: 02/09/2019 Date: 02/03/2019 Goal Status: Met Interventions: Provide education on ulcer and skin care Notes: Electronic Signature(s) Signed: 04/26/2019 8:57:53 AM By: [name] Coria RN Entered By: [name] on 04/25/2019 10:13:53 -------------------------------------------------------------------------------- Pain Assessment Details Patient Name: Date of Service: [name], [name] 04/25/2019 10:00 AM Medical Record  Number:[id number] Patient Account Number: [id number] Date of Birth/Sex: Treating RN: December 30, 1935 (83 y.o. [name]) [name] Primary Care Yuuki Skeens: [name] Other Clinician: Referring Kimisha Eunice: Treating Friend Dorfman/Extender:[name], [name] Weeks in [name]: 19 Active Problems Location of Pain Severity and Description of Pain Patient Has Paino No Site Locations Pain Management and Medication Current Pain Management: Electronic Signature(s) Signed: 04/26/2019 8:57:53 AM By: [name] Coria RN Signed: 05/24/2019 9:21:08 AM By: [name] Entered By: [name] on 04/25/2019 10:35:43 -------------------------------------------------------------------------------- Patient/Caregiver Education Details Patient Name: Date of Service: [name], [name] 3/16/2021andnbsp10:00 AM Medical Record Number:[id number] Patient Account Number: [id number] Date of Birth/Gender: Treating RN: 1935-04-07 (83 y.o. [name] Primary Care Physician: [name] Other Clinician: Referring Physician: Treating Physician/Extender:[name], [name] Weeks in [name]: 5 Education Assessment Education Provided To: Patient Education Topics Provided Wound/Skin Impairment: Methods: Explain/Verbal Responses: State content correctly Electronic Signature(s) Signed: 04/26/2019 8:57:53 AM By: [name] Coria RN Entered By: [name] on 04/25/2019 10:14:41 -------------------------------------------------------------------------------- Wound Assessment Details Patient Name: Date of Service: [name], [name] 04/25/2019 10:00 AM Medical Record Number:[id number] Patient Account Number: [id number] Date of Birth/Sex: Treating RN: October 09, 1935 (83 y.o. [name]) [name] Primary Care Yanky Vanderburg: [name] Other Clinician: Referring Sharne Linders: Treating Sharise Lippy/Extender:[name], [name] Weeks in [name]: 19 Wound Status Wound Number: 4 Primary Venous Leg Ulcer Etiology: Wound  Location: Left Malleolus - Medial Wound Open Wounding Event: Gradually Appeared Status: Date Acquired: 03/12/2018 Comorbid Chronic Obstructive  Pulmonary Disease Weeks Of [name]: 19 History: (COPD), Coronary Artery Disease, Clustered Wound: No Hypertension, Peripheral Venous Disease, Received Chemotherapy, Received Radiation Photos Photo Uploaded By: Mikeal Hawthorne on 04/26/2019 14:12:38 Wound Measurements Length: (cm) 2 % Reduc Width: (cm) 5 % Reduc Depth: (cm) 0.1 Epithel Area: (cm) 7.854 Tunnel Volume: (cm) 0.785 Underm Wound Description Full Thickness Without Exposed Support Foul Od Classification: Structures Slough/ Wound Indistinct, nonvisible Margin: Exudate Medium Amount: Exudate Serous Type: Exudate amber Color: Wound Bed Granulation Amount: Large (67-100%) Granulation Quality: Pink Fascia Necrotic Amount: Small (1-33%) Fat Lay Necrotic Quality: Adherent Slough Tendon Muscle Expo Joint Expos Bone Expose or After Cleansing: No Fibrino Yes Exposed Structure Exposed: No er (Subcutaneous Tissue) Exposed: Yes Exposed: No sed: No ed: No d: No tion in Area: -6.4% tion in Volume: 46.9% ialization: Medium (34-66%) ing: No ining: No Electronic Signature(s) Signed: 04/26/2019 8:57:53 AM By: [name] Coria RN Signed: 05/01/2019 5:18:21 PM By: [name] Hurst RN, [name] Entered By: [name] on 04/25/2019 10:50:44 -------------------------------------------------------------------------------- [place] Details Patient Name: Date of Service: [name], [name] 04/25/2019 10:00 AM Medical Record Number:[id number] Patient Account Number: [id number] Date of Birth/Sex: Treating RN: 1935/03/24 (83 y.o. [name]) Dolores Lory, [place] Primary Care Ahlam Piscitelli: [name] Other Clinician: Referring Genevieve Arbaugh: Treating Mihail Prettyman/Extender:[name], [name] Weeks in [name]: 19 Vital Signs Time Taken: 10:36 Temperature (F): 97.7 Height (in): 76 Pulse (bpm): 52 Weight  (lbs): 227 Respiratory Rate (breaths/min): 18 Body Mass Index (BMI): 27.6 Blood Pressure (mmHg): 148/78 Reference Range: 80 - 120 mg / dl Electronic Signature(s) Signed: 05/24/2019 9:21:08 AM By: [name] Entered By: [name] on 04/25/2019 10:37:51

## 2019-06-16 ENCOUNTER — Telehealth: Payer: Self-pay | Admitting: Cardiology

## 2019-06-16 NOTE — Telephone Encounter (Signed)
New message   Patient's wife states that she will be coming with patient to Dr. Rosezella [place] office for appt the patient is hearing impaired.

## 2019-06-16 NOTE — Telephone Encounter (Signed)
Noted in appointment notes-thanks!

## 2019-06-20 DIAGNOSIS — Z7189 Other specified counseling: Secondary | ICD-10-CM | POA: Insufficient documentation

## 2019-06-20 NOTE — Progress Notes (Signed)
Cardiology Office Note   Date:  06/21/2019   ID:  [name], DOB 06/06/1935, MRN [id number]  PCP:  Renne Crigler, NP  Cardiologist:   No primary care provider on file.   Chief Complaint  Patient presents with  . Extremity Weakness      History of Present Illness: [name] is a 84 y.o. male who presents for follow up of CAD.  Since I last saw him he was in the hospital in November.  I reviewed these records.  He had sudden leg weakness.  He had an extensive work-up but there was no clear neurologic cause.  He did have nonhealing leg ulcers.  He was thought to have a COPD flare.  He ended up going to rehab afterwards.  He has been seen in the wound clinic.  He actually had angiography of his lower extremities and demonstrated no flow-limiting stenosis.  He has been getting back to ambulating with a walker and canes.  Of note during all of this there were no acute cardiovascular complications.  I did see an echocardiogram which demonstrated his EF to be 50 to 55% with no significant abnormalities including no significant pulmonary hypertension.  He has had no chest pressure, neck or arm discomfort.  Denies any shortness of breath, PND or orthopnea.  He has had no weight gain but he has had occasional edema.   Past Medical History:  Diagnosis Date  . Arthritis   . CAD (coronary artery disease)    Abnormal stress perfusion study with apical and inferior wall ischemia. LAD had a long 99% stenosis followed by 30% stenosis, first diagonal 25% stenosis, circumflex luminal irregularities, second obtuse marginal 25% stenosis, RCA 40% followed by 40% stenosis. EF 60%. Stenting with 2 bare metal stents by Dr. Burt Knack to the LAD.  Marland Kitchen Colon cancer [facility])    Radiation, chemo, surgery 2011  . Complication of anesthesia    Pt states he coughs alot during anesthesia  . COPD (chronic obstructive pulmonary disease) ([place])   . History of tobacco use   . Hypertension     Past Surgical History:   Procedure Laterality Date  . ABDOMINAL AORTOGRAM W/LOWER EXTREMITY N/A 01/09/2019   Procedure: ABDOMINAL AORTOGRAM W/LOWER EXTREMITY;  Surgeon: Waynetta Sandy, MD;  Location: [place] [facility];  Service: Cardiovascular;  Laterality: N/A;  . CATARACT EXTRACTION W/PHACO Left 05/29/2016   Procedure: CATARACT EXTRACTION PHACO AND INTRAOCULAR LENS PLACEMENT (IOC);  Surgeon: Baruch Goldmann, MD;  Location: [facility];  Service: Ophthalmology;  Laterality: Left;  CDE: 10.39  . CATARACT EXTRACTION W/PHACO Right 06/12/2016   Procedure: CATARACT EXTRACTION PHACO AND INTRAOCULAR LENS PLACEMENT RIGHT EYE CDE= 13.59;  Surgeon: Baruch Goldmann, MD;  Location: [facility];  Service: Ophthalmology;  Laterality: Right;  right  . CORONARY ANGIOPLASTY WITH STENT PLACEMENT  2008   x 2  . HEMICOLECTOMY  01/10/2010  . INGUINAL HERNIA REPAIR  2012  . PORTACATH PLACEMENT  2011  . portacath removal  2011  . UMBILICAL HERNIA REPAIR  01/2008   left     Current Outpatient Medications  Medication Sig Dispense Refill  . acetaminophen (TYLENOL) 325 MG tablet Take 650 mg by mouth every 6 (six) hours as needed.    Marland Kitchen albuterol (VENTOLIN HFA) 108 (90 Base) MCG/ACT inhaler Inhale 2 puffs into the lungs every 6 (six) hours as needed for wheezing or shortness of breath. 6.7 g 0  . ALBUTEROL IN albuterol sulf 90 mcg/actuation breath activated powder  inhaler,sensor  Inhale 2 puffs every 6 hours by inhalation route.    Marland Kitchen amLODipine (NORVASC) 10 MG tablet Take 10 mg by mouth daily.    Marland Kitchen aspirin 81 MG chewable tablet Chew 81 mg by mouth every evening.    Marland Kitchen atorvastatin (LIPITOR) 20 MG tablet Take 1 tablet (20 mg total) by mouth every evening.    . benazepril (LOTENSIN) 40 MG tablet benazepril 40 mg tablet  Take 1 tablet every day by oral route.    . Calcium 150 MG TABS Take 1 tablet by mouth daily.    . Ferrous Gluconate (IRON) 240 (27 FE) MG TABS Take 240 mg by mouth daily.     . Fluticasone-Umeclidin-Vilant (TRELEGY ELLIPTA)  200-62.5-25 MCG/INH AEPB Inhale 1 puff into the lungs daily. Please remember to wash mouth out afterwards! 180 each 3  . guaifenesin (ROBITUSSIN) 100 MG/5ML syrup Take 200 mg by mouth 3 (three) times daily as needed for cough.    . Lycopene 10 MG CAPS Take 25 mg by mouth daily.     . Magnesium 100 MG CAPS Take 1 capsule by mouth daily.    . metoprolol tartrate (LOPRESSOR) 100 MG tablet Take 1 tablet (100 mg total) by mouth at bedtime.    . nitroGLYCERIN (NITROSTAT) 0.4 MG SL tablet Place 1 tablet (0.4 mg total) under the tongue every 5 (five) minutes as needed. (Patient taking differently: Place 0.4 mg under the tongue every 5 (five) minutes as needed for chest pain. ) 25 tablet 3  . Vitamin D, Cholecalciferol, 1000 UNITS TABS Take 1,000 Units by mouth daily.     . Zinc Sulfate (ZINC 15 PO) Take 1 tablet by mouth daily.    . furosemide (LASIX) 40 MG tablet Take 1 tablet (40 mg total) by mouth as directed. Please take (1) tablet in morning for two days  Hold other tablets for as needed use 30 tablet 0  . Influenza Vac High-Dose Quad (FLUZONE HIGH-DOSE QUADRIVALENT) 0.7 ML SUSY Fluzone High-Dose Quad 2020-21 (PF) 240 mcg/0.7 mL IM syringe  PHARMACIST ADMINISTERED IMMUNIZATION ADMINISTERED AT TIME OF DISPENSING    . tamsulosin (FLOMAX) 0.4 MG CAPS capsule Take 0.4 mg by mouth daily.     No current facility-administered medications for this visit.    Allergies:   Sulfa antibiotics    ROS:  Please see the history of present illness.   Otherwise, review of systems are positive for none.   All other systems are reviewed and negative.    PHYSICAL EXAM: VS:  BP 102/62   Pulse (!) 56   Ht 6\' 3"  (1.905 m)   Wt 263 lb (119.3 kg)   BMI 32.87 kg/m  , BMI Body mass index is 32.87 kg/m. GEN:  No distress NECK:  No jugular venous distention at 90 degrees, waveform within normal limits, carotid upstroke brisk and symmetric, no bruits, no thyromegaly LYMPHATICS:  No cervical adenopathy LUNGS:  Clear to  auscultation bilaterally BACK:  No CVA tenderness CHEST:  Unremarkable HEART:  S1 and S2 within normal limits, no S3, no S4, no clicks, no rubs, no murmurs ABD:  Positive bowel sounds normal in frequency in pitch, no bruits, no rebound, no guarding, unable to assess midline mass or bruit with the patient seated. EXT:  2 plus pulses throughout, moderate edema, no cyanosis no clubbing SKIN:  No rashes no nodules NEURO:  Cranial nerves II through XII grossly intact, motor grossly intact throughout PSYCH:  Cognitively intact, oriented to person place and time  EKG:  EKG is ordered today. The ekg ordered today demonstrates sinus bradycardia, rate 56, right bundle branch block, left anterior fascicular block   Recent Labs: 11/13/2018: TSH 1.205 11/14/2018: Magnesium 1.8 11/16/2018: ALT 64 11/20/2018: Platelets 552 01/09/2019: BUN 16; Creatinine, Ser 0.80; Hemoglobin 12.2; Potassium 4.3; Sodium 139    Lipid Panel    Component Value Date/Time   CHOL 113 06/09/2012 0946   TRIG 104 06/09/2012 0946   HDL 44 06/09/2012 0946   CHOLHDL 5.3 11/24/2007 0438   VLDL 15 11/24/2007 0438   LDLCALC 48 06/09/2012 0946      Wt Readings from Last 3 Encounters:  06/21/19 263 lb (119.3 kg)  05/22/19 255 lb 12.8 oz (116 kg)  04/14/19 256 lb (116.1 kg)      Other studies Reviewed: Additional studies/ records that were reviewed today include: Hospital records. Review of the above records demonstrates:  Please see elsewhere in the note.     ASSESSMENT AND PLAN:  CAD (coronary artery disease) - He has had no anginal symptoms.  He had stress testing way back in 2015 but at this point I would have no indication for further testing but would pursue continued risk reduction.   DYSLIPIDEMIA -  Lipids by his primary care physician were excellent.  LDL was 57 in February.  Triglycerides 124 and HDL 53.  No change in therapy.   HYPERTENSION, UNSPECIFIED -  The blood pressure is well controlled.  No  change in therapy.  OBESITY - He is not eating properly and he and I talked about this at length.  ABNORMAL EKG - He does have bifascicular block.  However, it is clear that his events were not syncope and has not had syncope.  Therefore, I do not think further testing is indicated.  He will let me know if he does not ever have episodes it could be presyncope or syncope.  AAA - This was 3.1 cm last year.  We can defer follow-up again till next year as he has had so many recent studies.   EDEMA -   He does have some mild increased edema.  We talked about salt restriction.  I am going to give him Lasix 40 mg for 2 days.  COVID EDUCATION - He has had his vaccines.  Current medicines are reviewed at length with the patient today.  The patient does not have concerns regarding medicines.  The following changes have been made: As above  Labs/ tests ordered today include:   Orders Placed This Encounter  Procedures  . EKG 12-Lead     Disposition:   FU with me in 6 months.   Signed, Minus Breeding, MD  06/21/2019 12:00 PM    [place]

## 2019-06-21 ENCOUNTER — Telehealth: Payer: Self-pay | Admitting: Cardiology

## 2019-06-21 ENCOUNTER — Other Ambulatory Visit: Payer: Self-pay

## 2019-06-21 ENCOUNTER — Encounter: Payer: Self-pay | Admitting: Cardiology

## 2019-06-21 ENCOUNTER — Ambulatory Visit (INDEPENDENT_AMBULATORY_CARE_PROVIDER_SITE_OTHER): Payer: Medicare Other | Admitting: Cardiology

## 2019-06-21 VITALS — BP 102/62 | HR 56 | Ht 75.0 in | Wt 263.0 lb

## 2019-06-21 DIAGNOSIS — I10 Essential (primary) hypertension: Secondary | ICD-10-CM | POA: Diagnosis not present

## 2019-06-21 DIAGNOSIS — I714 Abdominal aortic aneurysm, without rupture, unspecified: Secondary | ICD-10-CM

## 2019-06-21 DIAGNOSIS — E785 Hyperlipidemia, unspecified: Secondary | ICD-10-CM

## 2019-06-21 DIAGNOSIS — R9431 Abnormal electrocardiogram [ECG] [EKG]: Secondary | ICD-10-CM

## 2019-06-21 DIAGNOSIS — I2510 Atherosclerotic heart disease of native coronary artery without angina pectoris: Secondary | ICD-10-CM

## 2019-06-21 DIAGNOSIS — Z7189 Other specified counseling: Secondary | ICD-10-CM

## 2019-06-21 MED ORDER — FUROSEMIDE 40 MG PO TABS
40.0000 mg | ORAL_TABLET | ORAL | 0 refills | Status: DC
Start: 2019-06-21 — End: 2020-04-10

## 2019-06-21 NOTE — Telephone Encounter (Signed)
Called and reviewed medications with patient's wife.  Documented per her report.  A corrected listed will be mailed to their home address.

## 2019-06-21 NOTE — Patient Instructions (Signed)
Medication Instructions:  Please take Furosemide 40 mg for the next 2 days only.   Continue all other medications as listed.  *If you need a refill on your cardiac medications before your next appointment, please call your pharmacy*  Follow-Up: At [facility], you and your health needs are our priority.  As part of our continuing mission to provide you with exceptional heart care, we have created designated Provider Care Teams.  These Care Teams include your primary Cardiologist (physician) and Advanced Practice Providers (APPs -  Physician Assistants and Nurse Practitioners) who all work together to provide you with the care you need, when you need it.  We recommend signing up for the patient portal called "MyChart".  Sign up information is provided on this After Visit Summary.  MyChart is used to connect with patients for Virtual Visits (Telemedicine).  Patients are able to view lab/test results, encounter notes, upcoming appointments, etc.  Non-urgent messages can be sent to your provider as well.   To learn more about what you can do with MyChart, go to [email or web address].    Your next appointment:   6 month(s)  The format for your next appointment:   In Person  Provider:   Minus Breeding, MD   Thank you for choosing [facility]!!

## 2019-06-21 NOTE — Telephone Encounter (Signed)
Spoke with patient and spouse. Patient seen in [place] this morning and was told to call back with updated medication list. Lycopene is actually 600mg  and included in the multivitamin. Patient has also been taking HCTZ 25mg  daily. Patient wants to know if he should continue the HCTZ while taking the furosemide for the next 2 days. Will route to Dr. Percival Spanish and primary nurse for [facility] location for review.

## 2019-06-21 NOTE — Telephone Encounter (Signed)
Patient's wife is calling to provide an accurate list of medications that the patient is taking. She states the list of medications provided during appointment today is not correct. Please call.

## 2019-07-20 ENCOUNTER — Other Ambulatory Visit: Payer: Self-pay

## 2019-07-20 ENCOUNTER — Ambulatory Visit
Admission: RE | Admit: 2019-07-20 | Discharge: 2019-07-20 | Disposition: A | Discharge status: Home / Self Care | Payer: Medicare Other | Source: Ambulatory Visit | Attending: Pulmonary Disease | Admitting: Pulmonary Disease

## 2019-07-20 DIAGNOSIS — R911 Solitary pulmonary nodule: Secondary | ICD-10-CM

## 2019-07-26 ENCOUNTER — Other Ambulatory Visit: Payer: Self-pay | Admitting: Pulmonary Disease

## 2019-07-26 DIAGNOSIS — R918 Other nonspecific abnormal finding of lung field: Secondary | ICD-10-CM

## 2019-07-26 DIAGNOSIS — R911 Solitary pulmonary nodule: Secondary | ICD-10-CM

## 2019-08-01 ENCOUNTER — Telehealth: Payer: Self-pay | Admitting: Pulmonary Disease

## 2019-08-01 NOTE — Telephone Encounter (Signed)
Lm for pt

## 2019-08-01 NOTE — Telephone Encounter (Signed)
Pt has been scheduled with Dr. Vaughan Browner on 08/11/2019 at 12:00.  Pt's spouse, Priscailla (DPR) is aware and voiced her understanding. Nothing further is needed.

## 2019-08-01 NOTE — Telephone Encounter (Signed)
[name] received staff message from Dr. Vaughan Browner stating:           I can see him as add on last patient of the day on 7/2 before lunch. I have 1/2 day that day.  If patient is unable to complete the visit on 7/2 then can schedule with me as previously planned and I can follow-up with Dr. Vaughan Browner.  [name]

## 2019-08-01 NOTE — Telephone Encounter (Signed)
08/01/2019  Can we please contact the patient and get him scheduled for an appointment within the next week with me to discuss potentially an upcoming procedure with Dr. Valeta Harms.  This is a patient of Dr. Vaughan Browner.  We will review the PET scan as well as discussed procedure.   If no availability please let me know and we can double book to get the patient in.  Patient can be scheduled with either me or Dr. Vaughan Browner sometime over the next 7 days.  We will route to [facility] for follow-up and patient contact.  We will route to Dr. Vaughan Browner and Dr. Valeta Harms as Juluis Rainier.  Wyn Quaker, FNP

## 2019-08-01 NOTE — Telephone Encounter (Signed)
Great thanks Garner Nash, DO [facility] 08/01/2019 10:09 AM

## 2019-08-01 NOTE — Telephone Encounter (Signed)
Pt's wife returning a phone call. Pt's wife can be reached at [phone].

## 2019-08-08 ENCOUNTER — Ambulatory Visit (HOSPITAL_COMMUNITY)
Admission: RE | Admit: 2019-08-08 | Discharge: 2019-08-08 | Disposition: A | Discharge status: Home / Self Care | Payer: Medicare Other | Source: Ambulatory Visit | Attending: Pulmonary Disease | Admitting: Pulmonary Disease

## 2019-08-08 ENCOUNTER — Other Ambulatory Visit: Payer: Self-pay

## 2019-08-08 ENCOUNTER — Telehealth: Payer: Self-pay | Admitting: Pulmonary Disease

## 2019-08-08 DIAGNOSIS — I2510 Atherosclerotic heart disease of native coronary artery without angina pectoris: Secondary | ICD-10-CM | POA: Insufficient documentation

## 2019-08-08 DIAGNOSIS — I714 Abdominal aortic aneurysm, without rupture: Secondary | ICD-10-CM | POA: Diagnosis not present

## 2019-08-08 DIAGNOSIS — R911 Solitary pulmonary nodule: Secondary | ICD-10-CM

## 2019-08-08 DIAGNOSIS — R918 Other nonspecific abnormal finding of lung field: Secondary | ICD-10-CM | POA: Insufficient documentation

## 2019-08-08 DIAGNOSIS — N201 Calculus of ureter: Secondary | ICD-10-CM | POA: Insufficient documentation

## 2019-08-08 LAB — GLUCOSE, CAPILLARY: Glucose-Capillary: 100 mg/dL — ABNORMAL HIGH (ref 70–99)

## 2019-08-08 MED ORDER — FLUDEOXYGLUCOSE F - 18 (FDG) INJECTION
13.7700 | Freq: Once | INTRAVENOUS | Status: AC | PRN
  Administered 2019-08-08: 13.77 via INTRAVENOUS

## 2019-08-08 NOTE — Telephone Encounter (Signed)
Patient has an apt with Dr. Vaughan Browner on 7/2, needs to keep apt. Will review PET scan with him then

## 2019-08-08 NOTE — Telephone Encounter (Signed)
Call report from PET scan 08/08/2019, see comments 1,2, and 5 per radiologist.  IMPRESSION: 1. Low level uptake within the RIGHT upper lobe pulmonary nodule but less than mediastinal blood pool activity. Findings remain concerning for indolent neoplasm based on morphologic characteristics. Continued discussion in multi disciplinary thoracic oncologic setting is suggested. 2. Skin thickening in the LEFT axilla with mild FDG uptake. This is likely amenable to direct clinical inspection. 3. No signs of hypermetabolic lymph nodes in the chest or abdomen. 4. Distal RIGHT ureteral calculi 1 of these may have been present as far back as 2019 5. 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 6. Mild aneurysmal dilation of the abdominal aorta Recommend followup by ultrasound in 3 years. This recommendation follows ACR consensus guidelines: White Paper of the ACR Incidental Findings Committee II on Vascular Findings. [facility]iol 2013; [phone]

## 2019-08-10 ENCOUNTER — Telehealth: Payer: Self-pay | Admitting: Pulmonary Disease

## 2019-08-10 NOTE — Telephone Encounter (Signed)
States that they have a hard time relaying info to children after appts. Please advise.

## 2019-08-10 NOTE — Telephone Encounter (Signed)
Called and spoke with Patient and wife. They have read PET and CT results on my chart, but do not understand. They are requesting copy of test,  plain explanation, and treatment options at [facility], so they can give it to there children, so everyone understands. Patient will be at [facility] at 1200, 08/11/19 with Dr Vaughan Browner.  Will route message to Dr Vaughan Browner as Juluis Rainier

## 2019-08-11 ENCOUNTER — Ambulatory Visit (INDEPENDENT_AMBULATORY_CARE_PROVIDER_SITE_OTHER): Payer: Medicare Other | Admitting: Pulmonary Disease

## 2019-08-11 ENCOUNTER — Encounter (HOSPITAL_COMMUNITY): Payer: Self-pay | Admitting: Pulmonary Disease

## 2019-08-11 ENCOUNTER — Encounter: Payer: Self-pay | Admitting: Pulmonary Disease

## 2019-08-11 ENCOUNTER — Telehealth: Payer: Self-pay | Admitting: Pulmonary Disease

## 2019-08-11 ENCOUNTER — Other Ambulatory Visit: Payer: Self-pay

## 2019-08-11 VITALS — BP 132/70 | HR 55 | Temp 98.7°F | Ht 74.0 in | Wt 266.8 lb

## 2019-08-11 DIAGNOSIS — R2232 Localized swelling, mass and lump, left upper limb: Secondary | ICD-10-CM

## 2019-08-11 DIAGNOSIS — R911 Solitary pulmonary nodule: Secondary | ICD-10-CM | POA: Diagnosis not present

## 2019-08-11 DIAGNOSIS — I2510 Atherosclerotic heart disease of native coronary artery without angina pectoris: Secondary | ICD-10-CM

## 2019-08-11 NOTE — Progress Notes (Signed)
I called Mr. [name], [name]'s wife, Mr. [name] asked me to speak to [name]. Mr. [name] has not had chest pain or shortness of breath. Patient is scheduled for Covid test and is aware that he will quarantine after test.

## 2019-08-11 NOTE — Telephone Encounter (Signed)
Dr Vaughan Browner has ordered a bronchoscopy to be performed by Dr Valeta Harms next week.  Super D Xray has been placed by Dr Vaughan Browner on Dr Juline Patch desk.  Message routed to [facility] Pulm Procedure pool

## 2019-08-11 NOTE — Patient Instructions (Addendum)
We will schedule you for bronchoscopy next week with Dr. Valeta Harms for biopsy of the lung We will refer you to dermatology for evaluation of lesion in your left armpit Follow-up in 1 to 2 months.

## 2019-08-11 NOTE — H&P (View-Only) (Signed)
[name]    [id number]    10/05/1935  Primary Care Physician:Weeks, Georgeanna Lea, NP  Referring Physician: Renne Crigler, [place] [address]  Chief complaint: Follow up for lung nodule  HPI: 84 year old with significant smoking history, asbestos exposure, coronary artery disease, colon cancer, peripheral vascular disease  Hospitalized in October 2020 for lower extremity cellulitis.  CT scan at that time showed evidence of lower extremity nodular consolidation and a right upper lobe groundglass nodule suggestive of pneumonia.   He was treated with antibiotics for both conditions and referred to pulmonary for further evaluation.    Continues on [place] which was started during this admission.  He has weaned himself off supplemental oxygen Has chronic cough with white mucus, denies any fevers, chills  Pets: 3 dogs, cat.  No birds, farm animals Occupation: Retired Insurance account manager Exposures: Significant exposure to asbestos.  No mold, hot tub, Jacuzzi or down pillows or comforters Smoking history: 120-pack-year smoker.  Quit in 2005 Travel history: Originally lived in [place] and [place].  No significant recent travel Relevant family history: He was adopted and does not know of any family history  Interim history: He is here for follow-up of lung nodule which had grown slightly on CT scan Not PET avid but morphology suggestive of low-grade adenocarcinoma.  Outpatient Encounter Medications as of 08/11/2019  Medication Sig   albuterol (VENTOLIN HFA) 108 (90 Base) MCG/ACT inhaler Inhale 2 puffs into the lungs every 6 (six) hours as needed for wheezing or shortness of breath.   amLODipine-benazepril (LOTREL) 10-40 MG capsule Take 1 capsule by mouth daily.   aspirin 81 MG chewable tablet Chew 81 mg by mouth every evening.   atorvastatin (LIPITOR) 20 MG tablet Take 1 tablet (20 mg total) by mouth every evening.   Cholecalciferol (VITAMIN D3) 50 MCG  (2000 UT) TABS Take 50 mcg by mouth 2 (two) times daily. 1 tablet by mouth twice a day   ferrous sulfate 325 (65 FE) MG tablet Take 325 mg by mouth daily with breakfast.   Fluticasone-Umeclidin-Vilant (TRELEGY ELLIPTA) 200-62.5-25 MCG/INH AEPB Inhale 1 puff into the lungs daily. Please remember to wash mouth out afterwards!   furosemide (LASIX) 40 MG tablet Take 1 tablet (40 mg total) by mouth as directed. Please take (1) tablet in morning for two days  Hold other tablets for as needed use   hydrochlorothiazide (HYDRODIURIL) 25 MG tablet Take 25 mg by mouth daily.   Magnesium 250 MG TABS Take 1 tablet by mouth.   metoprolol tartrate (LOPRESSOR) 100 MG tablet Take 1 tablet (100 mg total) by mouth at bedtime.   Multiple Vitamins-Minerals (MENS MULTIPLE VITAMIN/LYCOPENE PO) Take 1 tablet by mouth daily.   naproxen sodium (ALEVE) 220 MG tablet Take 220 mg by mouth as needed.   nitroGLYCERIN (NITROSTAT) 0.4 MG SL tablet Place 1 tablet (0.4 mg total) under the tongue every 5 (five) minutes as needed. (Patient taking differently: Place 0.4 mg under the tongue every 5 (five) minutes as needed for chest pain. )   zinc gluconate 50 MG tablet Take 50 mg by mouth daily.   No facility-administered encounter medications on file as of 08/11/2019.   Physical Exam: Blood pressure 132/70, pulse (!) 55, temperature 98.7 F (37.1 C), temperature source Oral, height 6\' 2"  (1.88 m), weight 266 lb 12.8 oz (121 kg), SpO2 91 %. Gen:      No acute distress HEENT:  EOMI, sclera  anicteric Neck:     No masses; no thyromegaly Lungs:    Clear to auscultation bilaterally; normal respiratory effort CV:         Regular rate and rhythm; no murmurs Abd:      + bowel sounds; soft, non-tender; no palpable masses, no distension Ext:    No edema; adequate peripheral perfusion Skin:      Warm and dry; no rash Neuro: alert and oriented x 3 Psych: normal mood and affect  Data Reviewed: Imaging: CT abdomen pelvis  04/09/2017-visualized lung bases do not show any abnormality  CT chest 11/19/2018-no PE, patchy airspace disease in the right lower lobe.  1.6 cm right upper lobe subsolid nodule, coronary, aortic atherosclerosis  CT chest 07/21/2019-subsolid 2.3 cm right upper lobe nodule minimally increased in size, dilated pulmonary artery  PET scan 08/08/2019-low-level uptake in the right upper lobe pulmonary nodule, skin thickening in the left axilla with mild uptake, mild aneurysmal dilatation of abdominal aorta.  PFTs: 03/23/2019 FVC 3.09 [63%], FEV1 1.96 [56%], F/F 63, TLC 6.32 [78%], DLCO 11.68 [6 6%] Moderate obstruction with minimal restriction and diffusion defect  Labs: CBC 11/14/2018-WBC 11.7, eos 7%, absolute eosinophil count 819  Assessment:  Lung nodule CT reviewed with right upper lobe subsolid nodule.  Has minimally grown on repeat imaging.  Although FDG uptake is low morphology suggestive of low-grade adenocarcinoma.  Recommend biopsy with navigational bronchoscopy.  Discussed risk/benefit, possible complications of procedure in detail with patient and his wife today.  He is at increased risk of complication given his age but after informed decision making he has agreed to proceed with the biopsy.  This will be scheduled within the next week or 2  COPD Continue Dulera.  He will benefit from inhaled corticosteroid as he has elevated peripheral eosinophils  Asbestos exposure No evidence of ILD on CT.  Continue monitoring.  Abnormal PET uptake in left axilla On direct visualization of his axilla he has a dark skin lesion there which may be malignancy.  Suspect this is a separate issue from his lung nodule Refer him to dermatology for evaluation.  Abdominal aorta aneurysm Follow-up imaging in 3 months.  Plan/Recommendations: - Schedule bronchoscope with navigational biopsy - Dermatology referral  Marshell Garfinkel MD [facility] 08/11/2019, 12:42 PM  CC: Renne Crigler, NP

## 2019-08-11 NOTE — Progress Notes (Signed)
[name]    [id number]    [birth year]  Primary Care Physician:Weeks, Georgeanna Lea, NP  Referring Physician: Renne Crigler, [place] [address]  Chief complaint: Follow up for lung nodule  HPI: 84 year old with significant smoking history, asbestos exposure, coronary artery disease, colon cancer, peripheral vascular disease  Hospitalized in October 2020 for lower extremity cellulitis.  CT scan at that time showed evidence of lower extremity nodular consolidation and a right upper lobe groundglass nodule suggestive of pneumonia.   He was treated with antibiotics for both conditions and referred to pulmonary for further evaluation.    Continues on [place] which was started during this admission.  He has weaned himself off supplemental oxygen Has chronic cough with white mucus, denies any fevers, chills  Pets: 3 dogs, cat.  No birds, farm animals Occupation: Retired Insurance account manager Exposures: Significant exposure to asbestos.  No mold, hot tub, Jacuzzi or down pillows or comforters Smoking history: 120-pack-year smoker.  Quit in 2005 Travel history: Originally lived in [place] and [place].  No significant recent travel Relevant family history: He was adopted and does not know of any family history  Interim history: He is here for follow-up of lung nodule which had grown slightly on CT scan Not PET avid but morphology suggestive of low-grade adenocarcinoma.  Outpatient Encounter Medications as of 08/11/2019  Medication Sig  . albuterol (VENTOLIN HFA) 108 (90 Base) MCG/ACT inhaler Inhale 2 puffs into the lungs every 6 (six) hours as needed for wheezing or shortness of breath.  Marland Kitchen amLODipine-benazepril (LOTREL) 10-40 MG capsule Take 1 capsule by mouth daily.  Marland Kitchen aspirin 81 MG chewable tablet Chew 81 mg by mouth every evening.  Marland Kitchen atorvastatin (LIPITOR) 20 MG tablet Take 1 tablet (20 mg total) by mouth every evening.  . Cholecalciferol (VITAMIN D3) 50 MCG  (2000 UT) TABS Take 50 mcg by mouth 2 (two) times daily. 1 tablet by mouth twice a day  . ferrous sulfate 325 (65 FE) MG tablet Take 325 mg by mouth daily with breakfast.  . Fluticasone-Umeclidin-Vilant (TRELEGY ELLIPTA) 200-62.5-25 MCG/INH AEPB Inhale 1 puff into the lungs daily. Please remember to wash mouth out afterwards!  . furosemide (LASIX) 40 MG tablet Take 1 tablet (40 mg total) by mouth as directed. Please take (1) tablet in morning for two days  Hold other tablets for as needed use  . hydrochlorothiazide (HYDRODIURIL) 25 MG tablet Take 25 mg by mouth daily.  . Magnesium 250 MG TABS Take 1 tablet by mouth.  . metoprolol tartrate (LOPRESSOR) 100 MG tablet Take 1 tablet (100 mg total) by mouth at bedtime.  . Multiple Vitamins-Minerals (MENS MULTIPLE VITAMIN/LYCOPENE PO) Take 1 tablet by mouth daily.  . naproxen sodium (ALEVE) 220 MG tablet Take 220 mg by mouth as needed.  . nitroGLYCERIN (NITROSTAT) 0.4 MG SL tablet Place 1 tablet (0.4 mg total) under the tongue every 5 (five) minutes as needed. (Patient taking differently: Place 0.4 mg under the tongue every 5 (five) minutes as needed for chest pain. )  . zinc gluconate 50 MG tablet Take 50 mg by mouth daily.   No facility-administered encounter medications on file as of 08/11/2019.   Physical Exam: Blood pressure 132/70, pulse (!) 55, temperature 98.7 F (37.1 C), temperature source Oral, height 6\' 2"  (1.88 m), weight 266 lb 12.8 oz (121 kg), SpO2 91 %. Gen:      No acute distress HEENT:  EOMI, sclera  anicteric Neck:     No masses; no thyromegaly Lungs:    Clear to auscultation bilaterally; normal respiratory effort CV:         Regular rate and rhythm; no murmurs Abd:      + bowel sounds; soft, non-tender; no palpable masses, no distension Ext:    No edema; adequate peripheral perfusion Skin:      Warm and dry; no rash Neuro: alert and oriented x 3 Psych: normal mood and affect  Data Reviewed: Imaging: CT abdomen pelvis  04/09/2017-visualized lung bases do not show any abnormality  CT chest 11/19/2018-no PE, patchy airspace disease in the right lower lobe.  1.6 cm right upper lobe subsolid nodule, coronary, aortic atherosclerosis  CT chest 07/21/2019-subsolid 2.3 cm right upper lobe nodule minimally increased in size, dilated pulmonary artery  PET scan 08/08/2019-low-level uptake in the right upper lobe pulmonary nodule, skin thickening in the left axilla with mild uptake, mild aneurysmal dilatation of abdominal aorta.  PFTs: 03/23/2019 FVC 3.09 [63%], FEV1 1.96 [56%], F/F 63, TLC 6.32 [78%], DLCO 11.68 [6 6%] Moderate obstruction with minimal restriction and diffusion defect  Labs: CBC 11/14/2018-WBC 11.7, eos 7%, absolute eosinophil count 819  Assessment:  Lung nodule CT reviewed with right upper lobe subsolid nodule.  Has minimally grown on repeat imaging.  Although FDG uptake is low morphology suggestive of low-grade adenocarcinoma.  Recommend biopsy with navigational bronchoscopy.  Discussed risk/benefit, possible complications of procedure in detail with patient and his wife today.  He is at increased risk of complication given his age but after informed decision making he has agreed to proceed with the biopsy.  This will be scheduled within the next week or 2  COPD Continue Dulera.  He will benefit from inhaled corticosteroid as he has elevated peripheral eosinophils  Asbestos exposure No evidence of ILD on CT.  Continue monitoring.  Abnormal PET uptake in left axilla On direct visualization of his axilla he has a dark skin lesion there which may be malignancy.  Suspect this is a separate issue from his lung nodule Refer him to dermatology for evaluation.  Abdominal aorta aneurysm Follow-up imaging in 3 months.  Plan/Recommendations: - Schedule bronchoscope with navigational biopsy - Dermatology referral  Marshell Garfinkel MD [facility] 08/11/2019, 12:42 PM  CC: Renne Crigler, NP

## 2019-08-12 ENCOUNTER — Other Ambulatory Visit (HOSPITAL_COMMUNITY)
Admission: RE | Admit: 2019-08-12 | Discharge: 2019-08-12 | Disposition: A | Discharge status: Home / Self Care | Payer: Medicare Other | Source: Ambulatory Visit | Attending: Pulmonary Disease | Admitting: Pulmonary Disease

## 2019-08-12 DIAGNOSIS — Z01812 Encounter for preprocedural laboratory examination: Secondary | ICD-10-CM | POA: Diagnosis present

## 2019-08-12 DIAGNOSIS — Z20822 Contact with and (suspected) exposure to covid-19: Secondary | ICD-10-CM | POA: Diagnosis not present

## 2019-08-12 LAB — SARS CORONAVIRUS 2 (TAT 6-24 HRS): SARS Coronavirus 2: NEGATIVE

## 2019-08-15 ENCOUNTER — Ambulatory Visit (HOSPITAL_COMMUNITY): Payer: Medicare Other | Admitting: Registered Nurse

## 2019-08-15 ENCOUNTER — Other Ambulatory Visit: Payer: Self-pay

## 2019-08-15 ENCOUNTER — Ambulatory Visit (HOSPITAL_COMMUNITY): Payer: Medicare Other

## 2019-08-15 ENCOUNTER — Ambulatory Visit (HOSPITAL_COMMUNITY)
Admission: RE | Admit: 2019-08-15 | Discharge: 2019-08-15 | Disposition: A | Discharge status: Home / Self Care | Payer: Medicare Other | Attending: Pulmonary Disease | Admitting: Pulmonary Disease

## 2019-08-15 ENCOUNTER — Encounter (HOSPITAL_COMMUNITY)
Admission: RE | Disposition: A | Discharge status: Home / Self Care | Payer: Self-pay | Source: Home / Self Care | Attending: Pulmonary Disease

## 2019-08-15 ENCOUNTER — Telehealth: Payer: Self-pay | Admitting: Pulmonary Disease

## 2019-08-15 DIAGNOSIS — Z79899 Other long term (current) drug therapy: Secondary | ICD-10-CM | POA: Diagnosis not present

## 2019-08-15 DIAGNOSIS — Z77090 Contact with and (suspected) exposure to asbestos: Secondary | ICD-10-CM | POA: Insufficient documentation

## 2019-08-15 DIAGNOSIS — R911 Solitary pulmonary nodule: Secondary | ICD-10-CM

## 2019-08-15 DIAGNOSIS — I739 Peripheral vascular disease, unspecified: Secondary | ICD-10-CM | POA: Diagnosis not present

## 2019-08-15 DIAGNOSIS — I2510 Atherosclerotic heart disease of native coronary artery without angina pectoris: Secondary | ICD-10-CM | POA: Insufficient documentation

## 2019-08-15 DIAGNOSIS — I714 Abdominal aortic aneurysm, without rupture: Secondary | ICD-10-CM | POA: Insufficient documentation

## 2019-08-15 DIAGNOSIS — Z955 Presence of coronary angioplasty implant and graft: Secondary | ICD-10-CM | POA: Diagnosis not present

## 2019-08-15 DIAGNOSIS — Z85038 Personal history of other malignant neoplasm of large intestine: Secondary | ICD-10-CM | POA: Diagnosis not present

## 2019-08-15 DIAGNOSIS — Z7951 Long term (current) use of inhaled steroids: Secondary | ICD-10-CM | POA: Diagnosis not present

## 2019-08-15 DIAGNOSIS — Z7982 Long term (current) use of aspirin: Secondary | ICD-10-CM | POA: Diagnosis not present

## 2019-08-15 DIAGNOSIS — Z87891 Personal history of nicotine dependence: Secondary | ICD-10-CM | POA: Diagnosis not present

## 2019-08-15 DIAGNOSIS — J449 Chronic obstructive pulmonary disease, unspecified: Secondary | ICD-10-CM | POA: Insufficient documentation

## 2019-08-15 DIAGNOSIS — Z9889 Other specified postprocedural states: Secondary | ICD-10-CM

## 2019-08-15 DIAGNOSIS — R918 Other nonspecific abnormal finding of lung field: Secondary | ICD-10-CM | POA: Diagnosis not present

## 2019-08-15 DIAGNOSIS — I10 Essential (primary) hypertension: Secondary | ICD-10-CM | POA: Insufficient documentation

## 2019-08-15 HISTORY — PX: BRONCHIAL NEEDLE ASPIRATION BIOPSY: SHX5106

## 2019-08-15 HISTORY — DX: Pneumonia, unspecified organism: J18.9

## 2019-08-15 HISTORY — DX: Cardiac arrhythmia, unspecified: I49.9

## 2019-08-15 HISTORY — DX: Unspecified hearing loss, unspecified ear: H91.90

## 2019-08-15 HISTORY — PX: BRONCHIAL WASHINGS: SHX5105

## 2019-08-15 HISTORY — PX: VIDEO BRONCHOSCOPY WITH ENDOBRONCHIAL NAVIGATION: SHX6175

## 2019-08-15 HISTORY — DX: Dyspnea, unspecified: R06.00

## 2019-08-15 HISTORY — PX: BRONCHIAL BRUSHINGS: SHX5108

## 2019-08-15 HISTORY — DX: Personal history of urinary calculi: Z87.442

## 2019-08-15 HISTORY — PX: BRONCHIAL BIOPSY: SHX5109

## 2019-08-15 LAB — CBC
HCT: 40.3 % (ref 39.0–52.0)
Hemoglobin: 13.1 g/dL (ref 13.0–17.0)
MCH: 30.8 pg (ref 26.0–34.0)
MCHC: 32.5 g/dL (ref 30.0–36.0)
MCV: 94.6 fL (ref 80.0–100.0)
Platelets: 295 10*3/uL (ref 150–400)
RBC: 4.26 MIL/uL (ref 4.22–5.81)
RDW: 13.2 % (ref 11.5–15.5)
WBC: 6.9 10*3/uL (ref 4.0–10.5)
nRBC: 0 % (ref 0.0–0.2)

## 2019-08-15 LAB — COMPREHENSIVE METABOLIC PANEL
ALT: 17 U/L (ref 0–44)
AST: 20 U/L (ref 15–41)
Albumin: 3.7 g/dL (ref 3.5–5.0)
Alkaline Phosphatase: 63 U/L (ref 38–126)
Anion gap: 10 (ref 5–15)
BUN: 20 mg/dL (ref 8–23)
CO2: 27 mmol/L (ref 22–32)
Calcium: 9.1 mg/dL (ref 8.9–10.3)
Chloride: 101 mmol/L (ref 98–111)
Creatinine, Ser: 0.8 mg/dL (ref 0.61–1.24)
GFR calc Af Amer: >60 mL/min (ref >60)
GFR calc non Af Amer: >60 mL/min (ref >60)
Glucose, Bld: 99 mg/dL (ref 70–99)
Potassium: 3.8 mmol/L (ref 3.5–5.1)
Sodium: 138 mmol/L (ref 135–145)
Total Bilirubin: 0.7 mg/dL (ref 0.3–1.2)
Total Protein: 6.6 g/dL (ref 6.5–8.1)

## 2019-08-15 LAB — APTT: aPTT: 32 seconds (ref 24–36)

## 2019-08-15 LAB — PROTIME-INR
INR: 1 (ref 0.8–1.2)
Prothrombin Time: 13.1 seconds (ref 11.4–15.2)

## 2019-08-15 SURGERY — VIDEO BRONCHOSCOPY WITH ENDOBRONCHIAL NAVIGATION
Anesthesia: General

## 2019-08-15 MED ORDER — FENTANYL CITRATE (PF) 250 MCG/5ML IJ SOLN
INTRAMUSCULAR | Status: DC | PRN
  Administered 2019-08-15: 50 ug via INTRAVENOUS

## 2019-08-15 MED ORDER — ROCURONIUM BROMIDE 10 MG/ML (PF) SYRINGE
PREFILLED_SYRINGE | INTRAVENOUS | Status: DC | PRN
  Administered 2019-08-15: 20 mg via INTRAVENOUS
  Administered 2019-08-15: 40 mg via INTRAVENOUS

## 2019-08-15 MED ORDER — SUGAMMADEX SODIUM 200 MG/2ML IV SOLN
INTRAVENOUS | Status: DC | PRN
  Administered 2019-08-15 (×2): 100 mg via INTRAVENOUS

## 2019-08-15 MED ORDER — DEXAMETHASONE SODIUM PHOSPHATE 10 MG/ML IJ SOLN
INTRAMUSCULAR | Status: DC | PRN
  Administered 2019-08-15: 5 mg via INTRAVENOUS

## 2019-08-15 MED ORDER — PROPOFOL 10 MG/ML IV BOLUS
INTRAVENOUS | Status: DC | PRN
  Administered 2019-08-15: 150 mg via INTRAVENOUS

## 2019-08-15 MED ORDER — ONDANSETRON HCL 4 MG/2ML IJ SOLN
INTRAMUSCULAR | Status: DC | PRN
  Administered 2019-08-15: 4 mg via INTRAVENOUS

## 2019-08-15 MED ORDER — EPHEDRINE SULFATE-NACL 50-0.9 MG/10ML-% IV SOSY
PREFILLED_SYRINGE | INTRAVENOUS | Status: DC | PRN
  Administered 2019-08-15: 5 mg via INTRAVENOUS

## 2019-08-15 MED ORDER — ACETAMINOPHEN 500 MG PO TABS: 1000.0000 mg | ORAL_TABLET | Freq: Once | ORAL | Status: DC

## 2019-08-15 MED ORDER — LACTATED RINGERS IV SOLN: INTRAVENOUS | Status: DC | PRN

## 2019-08-15 MED ORDER — SUCCINYLCHOLINE CHLORIDE 200 MG/10ML IV SOSY
PREFILLED_SYRINGE | INTRAVENOUS | Status: DC | PRN
  Administered 2019-08-15: 100 mg via INTRAVENOUS

## 2019-08-15 MED ORDER — LIDOCAINE 2% (20 MG/ML) 5 ML SYRINGE
INTRAMUSCULAR | Status: DC | PRN
  Administered 2019-08-15: 60 mg via INTRAVENOUS

## 2019-08-15 SURGICAL SUPPLY — 45 items
ADAPTER BRONCH F/PENTAX (ADAPTER) ×4 IMPLANT
ADAPTER VALVE BIOPSY EBUS (MISCELLANEOUS) IMPLANT
ADPTR VALVE BIOPSY EBUS (MISCELLANEOUS)
BRUSH CYTOL CELLEBRITY 1.5X140 (MISCELLANEOUS) ×4 IMPLANT
BRUSH SUPERTRAX BIOPSY (INSTRUMENTS) IMPLANT
BRUSH SUPERTRAX NDL-TIP CYTO (INSTRUMENTS) ×4 IMPLANT
CANISTER SUCT 3000ML PPV (MISCELLANEOUS) ×4 IMPLANT
CHANNEL WORK EXTEND EDGE 180 (KITS) IMPLANT
CHANNEL WORK EXTEND EDGE 45 (KITS) IMPLANT
CHANNEL WORK EXTEND EDGE 90 (KITS) IMPLANT
CONT SPEC 4OZ CLIKSEAL STRL BL (MISCELLANEOUS) ×4 IMPLANT
COVER BACK TABLE 60X90IN (DRAPES) ×4 IMPLANT
FILTER STRAW FLUID ASPIR (MISCELLANEOUS) IMPLANT
FORCEPS BIOP SUPERTRX PREMAR (INSTRUMENTS) ×4 IMPLANT
GAUZE SPONGE 4X4 12PLY STRL (GAUZE/BANDAGES/DRESSINGS) ×4 IMPLANT
GLOVE SURG SS PI 7.5 STRL IVOR (GLOVE) ×8 IMPLANT
GOWN STRL REUS W/ TWL LRG LVL3 (GOWN DISPOSABLE) ×4 IMPLANT
GOWN STRL REUS W/TWL LRG LVL3 (GOWN DISPOSABLE) ×8
KIT CLEAN ENDO COMPLIANCE (KITS) ×4 IMPLANT
KIT LOCATABLE GUIDE (CANNULA) IMPLANT
KIT MARKER FIDUCIAL DELIVERY (KITS) IMPLANT
KIT PROCEDURE EDGE 180 (KITS) IMPLANT
KIT PROCEDURE EDGE 45 (KITS) IMPLANT
KIT PROCEDURE EDGE 90 (KITS) IMPLANT
KIT TURNOVER KIT B (KITS) ×4 IMPLANT
MARKER SKIN DUAL TIP RULER LAB (MISCELLANEOUS) ×4 IMPLANT
NEEDLE SUPERTRX PREMARK BIOPSY (NEEDLE) ×4 IMPLANT
NS IRRIG 1000ML POUR BTL (IV SOLUTION) ×4 IMPLANT
OIL SILICONE PENTAX (PARTS (SERVICE/REPAIRS)) ×4 IMPLANT
PAD ARMBOARD 7.5X6 YLW CONV (MISCELLANEOUS) ×8 IMPLANT
PATCHES PATIENT (LABEL) ×12 IMPLANT
SOL ANTI FOG 6CC (MISCELLANEOUS) ×2 IMPLANT
SOLUTION ANTI FOG 6CC (MISCELLANEOUS) ×2
SYR 20CC LL (SYRINGE) ×4 IMPLANT
SYR 20ML ECCENTRIC (SYRINGE) ×4 IMPLANT
SYR 50ML SLIP (SYRINGE) ×4 IMPLANT
TOWEL OR 17X24 6PK STRL BLUE (TOWEL DISPOSABLE) ×4 IMPLANT
TRAP SPECIMEN MUCOUS 40CC (MISCELLANEOUS) IMPLANT
TUBE CONNECTING 20'X1/4 (TUBING) ×1
TUBE CONNECTING 20X1/4 (TUBING) ×3 IMPLANT
UNDERPAD 30X30 (UNDERPADS AND DIAPERS) ×4 IMPLANT
VALVE BIOPSY  SINGLE USE (MISCELLANEOUS) ×4
VALVE BIOPSY SINGLE USE (MISCELLANEOUS) ×2 IMPLANT
VALVE SUCTION BRONCHIO DISP (MISCELLANEOUS) ×4 IMPLANT
WATER STERILE IRR 1000ML POUR (IV SOLUTION) ×4 IMPLANT

## 2019-08-15 NOTE — Telephone Encounter (Signed)
Dr Vaughan Browner placed CD on Dr Juline Patch desk /7//2/21.  Patient procedure is today 08/15/19 with Dr Valeta Harms.

## 2019-08-15 NOTE — Anesthesia Procedure Notes (Signed)
Procedure Name: Intubation Date/Time: 08/15/2019 11:22 AM Performed by: Trinna Post., CRNA Pre-anesthesia Checklist: Patient identified, Emergency Drugs available, Suction available, Patient being monitored and Timeout performed Patient Re-evaluated:Patient Re-evaluated prior to induction Oxygen Delivery Method: Circle system utilized Preoxygenation: Pre-oxygenation with 100% oxygen Induction Type: IV induction and Rapid sequence Ventilation: Mask ventilation without difficulty Laryngoscope Size: Mac and 4 Grade View: Grade II Tube type: Oral Tube size: 8.5 mm Number of attempts: 1 Airway Equipment and Method: Stylet Placement Confirmation: ETT inserted through vocal cords under direct vision,  positive ETCO2 and breath sounds checked- equal and bilateral Secured at: 22 cm Tube secured with: Tape Dental Injury: Teeth and Oropharynx as per pre-operative assessment

## 2019-08-15 NOTE — Telephone Encounter (Signed)
Patient is scheduled for procedure with Dr. Valeta Harms. Nothing further needed at this time.

## 2019-08-15 NOTE — Anesthesia Preprocedure Evaluation (Signed)
Anesthesia Evaluation  Patient identified by MRN, date of birth, ID band Patient awake    Reviewed: Allergy & Precautions, NPO status , Patient's Chart, lab work & pertinent test results, reviewed documented beta blocker date and time   Airway Mallampati: II  TM Distance: >3 FB Neck ROM: Full    Dental  (+) Dental Advisory Given   Pulmonary COPD, former smoker,    breath sounds clear to auscultation       Cardiovascular hypertension, Pt. on medications and Pt. on home beta blockers + CAD and + Cardiac Stents  + dysrhythmias  Rhythm:Regular Rate:Normal     Neuro/Psych negative neurological ROS     GI/Hepatic negative GI ROS, Neg liver ROS,   Endo/Other  negative endocrine ROS  Renal/GU negative Renal ROS     Musculoskeletal  (+) Arthritis ,   Abdominal   Peds  Hematology negative hematology ROS (+)   Anesthesia Other Findings   Reproductive/Obstetrics                             Anesthesia Physical Anesthesia Plan  ASA: III  Anesthesia Plan: General   Post-op Pain Management:    Induction: Intravenous  PONV Risk Score and Plan: 2 and Dexamethasone, Ondansetron and Treatment may vary due to age or medical condition  Airway Management Planned: Oral ETT  Additional Equipment: None  Intra-op Plan:   Post-operative Plan: Extubation in OR  Informed Consent: I have reviewed the patients History and Physical, chart, labs and discussed the procedure including the risks, benefits and alternatives for the proposed anesthesia with the patient or authorized representative who has indicated his/her understanding and acceptance.     Dental advisory given  Plan Discussed with: CRNA  Anesthesia Plan Comments:         Anesthesia Quick Evaluation

## 2019-08-15 NOTE — Interval H&P Note (Signed)
History and Physical Interval Note:  08/15/2019 10:57 AM  [name]  has presented today for surgery, with the diagnosis of lung nodule.  The various methods of treatment have been discussed with the patient and family. After consideration of risks, benefits and other options for treatment, the patient has consented to  Procedure(s): [place] (N/A) as a surgical intervention.  The patient's history has been reviewed, patient examined, no change in status, stable for surgery.  I have reviewed the patient's chart and labs.  Questions were answered to the patient's satisfaction.    Patient seen in pre-op all questions answer. Discussed risks benefit and alternatives. No barriers to proceed. Discussed risks of bleeding pneumothorax and death.   [place]

## 2019-08-15 NOTE — Op Note (Signed)
Navigational Bronchoscopy Procedure Note  [name]  [id number]  Jun 20, 1935  Date:08/15/19  Time:12:32 PM   Provider Performing:Christianne Zacher L Ajeet Casasola   Procedure: Video Bronchoscopy with Electromagnetic Navigation - Guided Biopsies  Indication(s)  right upper lobe lung nodule  Consent Risks of the procedure as well as the alternatives and risks of each were explained to the patient and/or caregiver.  Consent for the procedure was obtained.  Anesthesia General Anesthesia  Time Out Verified patient identification, verified procedure, site/side was marked, verified correct patient position, special equipment/implants available, medications/allergies/relevant history reviewed, required imaging and test results available.  Sterile Technique Usual hand hygiene, masks, gowns, and gloves were used  Procedure Description Prior to the date of the procedure a high-resolution CT scan of the chest was performed. Utilizing super Dimension software a virtual tracheobronchial tree was generated to allow the creation of distinct navigation pathways to the patient's parenchymal abnormalities. After being taken to the operating room general anesthesia was initiated and the patient was orally intubated. The video fiberoptic bronchoscope was introduced via the endotracheal tube and a general inspection was performed which showed normal right and left lung anatomy with no evidence of endobronchial lesion. The extendable working channel and locator guide were introduced into the bronchoscope. The distinct navigation pathways prepared prior to this procedure were then utilized to navigate to within 0.8 cm of patient's lesion(s) identified on CT scan.  A full fluoroscopic sweep was completed from RAO 25 to LAO 25 with inspiratory breath-hold APL 25 mmHg. The extendable working channel was secured into place and the locator guide was withdrawn. Under fluoroscopic guidance transbronchial needle brushings,  transbronchial Wang needle biopsies, and transbronchial forceps biopsies were performed to be sent for cytology and pathology. A bronchioalveolar lavage was performed in the right upper lobe and sent for cytology. At the end of the procedure a general airway inspection was performed and there was no evidence of active bleeding. The bronchoscope was removed.   Complications/Tolerance None; patient tolerated the procedure well. Chest X-ray is ordered to confirm no post-procedural complication.  EBL Minimal  Specimen(s) 1. Transbronchial needle brushings from right upper lobe 2. Transbronchial Wang needle biopsies from right upper lobe 3. Transbronchial forceps biopsies from right upper lobe 4. Bronchoalveolar lavage from right upper lobe  Of note, fiducial markers could not be placed as the fiducial delivery catheter is on back order and we do not have any in the facility.  Garner Nash, DO [facility] 08/15/2019 12:35 PM

## 2019-08-15 NOTE — Transfer of Care (Signed)
Immediate Anesthesia Transfer of Care Note  Patient: [name]  Procedure(s) Performed: VIDEO BRONCHOSCOPY WITH ENDOBRONCHIAL NAVIGATION (N/A ) BRONCHIAL NEEDLE ASPIRATION BIOPSIES BRONCHIAL BIOPSIES BRONCHIAL BRUSHINGS BRONCHIAL WASHINGS  Patient Location: PACU  Anesthesia Type:General  Level of Consciousness: awake, alert  and oriented  Airway & Oxygen Therapy: Patient Spontanous Breathing and Patient connected to face mask oxygen  Post-op Assessment: Report given to RN and Post -op Vital signs reviewed and stable  Post vital signs: Reviewed and stable  Last Vitals:  Vitals Value Taken Time  BP 126/68 08/15/19 1227  Temp    Pulse 70 08/15/19 1228  Resp 12 08/15/19 1228  SpO2 96 % 08/15/19 1228  Vitals shown include unvalidated device data.  Last Pain:  Vitals:   08/15/19 1019  TempSrc:   PainSc: 0-No pain         Complications: No complications documented.

## 2019-08-15 NOTE — Anesthesia Postprocedure Evaluation (Signed)
Anesthesia Post Note  Patient: [name]  Procedure(s) Performed: VIDEO BRONCHOSCOPY WITH ENDOBRONCHIAL NAVIGATION (N/A ) BRONCHIAL NEEDLE ASPIRATION BIOPSIES BRONCHIAL BIOPSIES BRONCHIAL BRUSHINGS BRONCHIAL WASHINGS     Patient location during evaluation: PACU Anesthesia Type: General Level of consciousness: awake and alert Pain management: pain level controlled Vital Signs Assessment: post-procedure vital signs reviewed and stable Respiratory status: spontaneous breathing, nonlabored ventilation, respiratory function stable and patient connected to nasal cannula oxygen Cardiovascular status: blood pressure returned to baseline and stable Postop Assessment: no apparent nausea or vomiting Anesthetic complications: no   No complications documented.  Last Vitals:  Vitals:   08/15/19 1257 08/15/19 1312  BP: 129/68 129/65  Pulse: 67 64  Resp: 12 12  Temp:  36.6 C  SpO2: 94% 95%    Last Pain:  Vitals:   08/15/19 1312  TempSrc:   PainSc: 0-No pain                 Tiajuana Amass

## 2019-08-16 LAB — SURGICAL PATHOLOGY

## 2019-08-16 LAB — CYTOLOGY - NON PAP

## 2019-08-17 ENCOUNTER — Encounter (HOSPITAL_COMMUNITY): Payer: Self-pay | Admitting: Pulmonary Disease

## 2019-08-18 ENCOUNTER — Ambulatory Visit: Payer: Medicare Other | Admitting: Pulmonary Disease

## 2019-09-14 ENCOUNTER — Other Ambulatory Visit: Payer: Self-pay

## 2019-09-14 ENCOUNTER — Ambulatory Visit (INDEPENDENT_AMBULATORY_CARE_PROVIDER_SITE_OTHER): Payer: Medicare Other | Admitting: Dermatology

## 2019-09-14 ENCOUNTER — Encounter: Payer: Self-pay | Admitting: Dermatology

## 2019-09-14 DIAGNOSIS — L821 Other seborrheic keratosis: Secondary | ICD-10-CM | POA: Diagnosis not present

## 2019-09-14 DIAGNOSIS — D229 Melanocytic nevi, unspecified: Secondary | ICD-10-CM

## 2019-09-14 DIAGNOSIS — I2510 Atherosclerotic heart disease of native coronary artery without angina pectoris: Secondary | ICD-10-CM

## 2019-09-14 DIAGNOSIS — D225 Melanocytic nevi of trunk: Secondary | ICD-10-CM

## 2019-09-14 DIAGNOSIS — D485 Neoplasm of uncertain behavior of skin: Secondary | ICD-10-CM | POA: Diagnosis not present

## 2019-09-14 NOTE — Patient Instructions (Addendum)
Biopsy, Surgery (Curettage) & Surgery (Excision) Aftercare Instructions  1. Okay to remove bandage in 24 hours  2. Wash area with soap and water  3. Apply Vaseline to area twice daily until healed (Not Neosporin)  4. Okay to cover with a Band-Aid to decrease the chance of infection or prevent irritation from clothing; also it's okay to uncover lesion at home.  5. Suture instructions: return to our office in 7-10 or 10-14 days for a nurse visit for suture removal. Variable healing with sutures, if pain or itching occurs call our office. It's okay to shower or bathe 24 hours after sutures are given.  6. The following risks may occur after a biopsy, curettage or excision: bleeding, scarring, discoloration, recurrence, infection (redness, yellow drainage, pain or swelling).  7. For questions, concerns and results call our office at [place] before 4pm & Friday before 3pm. Biopsy results will be available in 1 week.  First visit for [name] date of birth Jul 29, 1935.  His wife [name] accompanied him.  The concern was the question of spot on the lung found with a PET scan and the clinicians noticing a dark spot on the left arm.  This 2 cm heaped up keratotic dark brown nodule is a seborrheic keratosis; dermoscopy is quite typical.  Additionally [name] has 3 dozen similar smaller benign keratoses which were pointed out to his wife on his back and side of his abdomen.  I told the family that I am virtually positive that this is a benign growth and not a melanoma and not a source of any issue with the PET scan.  I did offer to obtain confirmatory biopsy but mutually we decided this was not currently necessary.  He also has a 4 cm thick crust on the top of his scalp which he hides from his family by frequently wearing a hat.  This may be a nonmole skin cancer and he permitted me to take 2 biopsies.  The results will be visible to the family on my chart in 2 to 3 days and I encouraged them to call my  office the next working day to discuss the result.  Home care is really minimal they can use a little Neosporin or Vaseline on the spot then after a day the bandage is optional.  If there is any problem with it my cell number is [phone].

## 2019-09-21 ENCOUNTER — Ambulatory Visit: Payer: Medicare Other | Admitting: Pulmonary Disease

## 2019-10-09 ENCOUNTER — Encounter: Payer: Self-pay | Admitting: Pulmonary Disease

## 2019-10-09 ENCOUNTER — Ambulatory Visit (INDEPENDENT_AMBULATORY_CARE_PROVIDER_SITE_OTHER): Payer: Medicare Other | Admitting: Pulmonary Disease

## 2019-10-09 ENCOUNTER — Other Ambulatory Visit: Payer: Self-pay

## 2019-10-09 VITALS — BP 120/50 | HR 58 | Temp 98.4°F | Ht 75.0 in | Wt 267.0 lb

## 2019-10-09 DIAGNOSIS — J449 Chronic obstructive pulmonary disease, unspecified: Secondary | ICD-10-CM

## 2019-10-09 DIAGNOSIS — I2510 Atherosclerotic heart disease of native coronary artery without angina pectoris: Secondary | ICD-10-CM

## 2019-10-09 DIAGNOSIS — R911 Solitary pulmonary nodule: Secondary | ICD-10-CM | POA: Diagnosis not present

## 2019-10-09 NOTE — Progress Notes (Signed)
[name]    [id number]    1936/01/17  Primary Care Physician:Weeks, Georgeanna Lea, NP  Referring Physician: Renne Crigler, [place] [address]  Chief complaint: Follow up for lung nodule  HPI: 84 year old with significant smoking history, asbestos exposure, coronary artery disease, colon cancer, peripheral vascular disease  Hospitalized in October 2020 for lower extremity cellulitis.  CT scan at that time showed evidence of lower extremity nodular consolidation and a right upper lobe groundglass nodule suggestive of pneumonia.   He was treated with antibiotics for both conditions and referred to pulmonary for further evaluation.    Continues on [place] which was started during this admission.  He has weaned himself off supplemental oxygen Has chronic cough with white mucus, denies any fevers, chills  Pets: 3 dogs, cat.  No birds, farm animals Occupation: Retired Insurance account manager Exposures: Significant exposure to asbestos.  No mold, hot tub, Jacuzzi or down pillows or comforters Smoking history: 120-pack-year smoker.  Quit in 2005 Travel history: Originally lived in [place] and [place].  No significant recent travel Relevant family history: He was adopted and does not know of any family history  Interim history: Follow-up imaging of lung nodule which had grown slightly on CT scan Not PET avid but morphology suggestive of low-grade adenocarcinoma.  Underwent navigational biopsy which showed benign lung tissue He was also seen by dermatology for left axillary lesion with activity on PET scan.  This was felt to be benign.  He underwent a scalp biopsy for a skin lesion which did not show any malignancy.  Outpatient Encounter Medications as of 10/09/2019  Medication Sig  . albuterol (VENTOLIN HFA) 108 (90 Base) MCG/ACT inhaler Inhale 2 puffs into the lungs every 6 (six) hours as needed for wheezing or shortness of breath.  Marland Kitchen amLODipine-benazepril  (LOTREL) 10-40 MG capsule Take 1 capsule by mouth daily.  Marland Kitchen aspirin 81 MG chewable tablet Chew 81 mg by mouth every evening.  Marland Kitchen atorvastatin (LIPITOR) 20 MG tablet Take 1 tablet (20 mg total) by mouth every evening.  . Cholecalciferol (VITAMIN D3) 50 MCG (2000 UT) TABS Take 2,000 Units by mouth 2 (two) times daily.   . Emollient (CERAVE) CREA Apply 1 application topically 2 (two) times daily.  . ferrous sulfate 325 (65 FE) MG tablet Take 325 mg by mouth daily with breakfast.  . Fluticasone-Umeclidin-Vilant (TRELEGY ELLIPTA) 200-62.5-25 MCG/INH AEPB Inhale 1 puff into the lungs daily. Please remember to wash mouth out afterwards!  . hydrochlorothiazide (HYDRODIURIL) 25 MG tablet Take 25 mg by mouth daily.  . Magnesium 250 MG TABS Take 250 mg by mouth daily.   . metoprolol tartrate (LOPRESSOR) 100 MG tablet Take 1 tablet (100 mg total) by mouth at bedtime.  . Multiple Vitamins-Minerals (MENS MULTIPLE VITAMIN/LYCOPENE PO) Take 1 tablet by mouth daily.  . naproxen sodium (ALEVE) 220 MG tablet Take 220 mg by mouth daily as needed (pain).   . nitroGLYCERIN (NITROSTAT) 0.4 MG SL tablet Place 1 tablet (0.4 mg total) under the tongue every 5 (five) minutes as needed.  Marland Kitchen OVER THE COUNTER MEDICATION Take 2 tablets by mouth daily. Fish, flax and borage oil  . furosemide (LASIX) 40 MG tablet Take 1 tablet (40 mg total) by mouth as directed. Please take (1) tablet in morning for two days  Hold other tablets for as needed use  . zinc gluconate 50 MG tablet Take 50 mg by mouth daily.   No  facility-administered encounter medications on file as of 10/09/2019.   Physical Exam: Blood pressure (!) 120/50, pulse (!) 58, temperature 98.4 F (36.9 C), temperature source Temporal, height 6\' 3"  (1.905 m), weight 267 lb (121.1 kg), SpO2 90 %. Gen:      No acute distress HEENT:  EOMI, sclera anicteric Neck:     No masses; no thyromegaly Lungs:    Clear to auscultation bilaterally; normal respiratory effort CV:          Regular rate and rhythm; no murmurs Abd:      + bowel sounds; soft, non-tender; no palpable masses, no distension Ext:    No edema; adequate peripheral perfusion Skin:      Warm and dry; no rash Neuro: alert and oriented x 3 Psych: normal mood and affect  Data Reviewed: Imaging: CT abdomen pelvis 04/09/2017-visualized lung bases do not show any abnormality  CT chest 11/19/2018-no PE, patchy airspace disease in the right lower lobe.  1.6 cm right upper lobe subsolid nodule, coronary, aortic atherosclerosis  CT chest 07/21/2019-subsolid 2.3 cm right upper lobe nodule minimally increased in size, dilated pulmonary artery  PET scan 08/08/2019-low-level uptake in the right upper lobe pulmonary nodule, skin thickening in the left axilla with mild uptake, mild aneurysmal dilatation of abdominal aorta.  PFTs: 03/23/2019 FVC 3.09 [63%], FEV1 1.96 [56%], F/F 63, TLC 6.32 [78%], DLCO 11.68 [6 6%] Moderate obstruction with minimal restriction and diffusion defect  Labs: CBC 11/14/2018-WBC 11.7, eos 7%, absolute eosinophil count 819  Pathology: Navigational lung biopsy 08/15/2019-negative Path and cytology Skin biopsy 09/14/2019-dermal scar  Assessment:  Lung nodule Negative pathology on navigational biopsy Results discussed in detail with patient We will continue monitoring Order CT chest for 6 months follow-up  COPD Continue Trelegy.  Continue triple therapy with ICS as he has elevated peripheral eosinophils.  Asbestos exposure No evidence of ILD on CT.  Continue monitoring.  Abnormal PET uptake in left axilla No malignancy on dermatology evaluation.  Plan/Recommendations: -CT in December 2021  Marshell Garfinkel MD [facility] 10/09/2019, 11:56 AM  CC: Renne Crigler, NP

## 2019-10-09 NOTE — Patient Instructions (Signed)
I have reviewed all your path results from lung biopsy and skin biopsy. Thankfully they all look okay with no evidence of malignancy  We will get a CT chest without contrast follow-up in December 2021 Follow-up in clinic after scan.

## 2019-10-29 ENCOUNTER — Encounter: Payer: Self-pay | Admitting: Dermatology

## 2019-10-29 NOTE — Progress Notes (Signed)
   New Patient   Subjective  [name] is a 84 y.o. male who presents for the following: New Patient (Initial Visit) (Pt stated--under arm large mole---for many years).  Growth Location: Under left arm Duration: Uncertain Quality: Stable Associated Signs/Symptoms: Modifying Factors:  Severity:  Timing: Context: Noticed by clinicians to found lung nodule on PET scan   The following portions of the chart were reviewed this encounter and updated as appropriate: Tobacco  Allergies  Meds  Problems  Med Hx  Surg Hx  Fam Hx      Objective  Well appearing patient in no apparent distress; mood and affect are within normal limits.  All skin waist up examined.   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  Skin / nail biopsy Type of biopsy: tangential   Informed consent: discussed and consent obtained   Timeout: patient name, date of birth, surgical site, and procedure verified   Procedure prep:  Patient was prepped and draped in usual sterile fashion Prep type:  Chlorhexidine Anesthesia: the lesion was anesthetized in a standard fashion   Anesthetic:  1% lidocaine w/ epinephrine 1-100,000 local infiltration Instrument used: flexible razor blade   Hemostasis achieved with: ferric subsulfate   Outcome: patient tolerated procedure well   Post-procedure details: wound care instructions given    Specimen 1 - Surgical pathology Differential Diagnosis: BCC SCC Check Margins: No  Seborrheic keratosis Left Breast  Leave if stable.  Nevus Mid Back  No biopsy currently indicated. First visit for [name] date of birth 1935-06-11.  His wife [name] accompanied him.  The concern was the question of spot on the lung found with a PET scan and the clinicians noticing a dark spot on the left arm.  This 2 cm heaped up keratotic dark brown nodule is a seborrheic keratosis; dermoscopy is quite typical.  Additionally [name] has 3 dozen similar  smaller benign keratoses which were pointed out to his wife on his back and side of his abdomen.  I told the family that I am virtually positive that this is a benign growth and not a melanoma and not a source of any issue with the PET scan.  I did offer to obtain confirmatory biopsy but mutually we decided this was not currently necessary.  He also has a 4 cm thick crust on the top of his scalp which he hides from his family by frequently wearing a hat.  This may be a nonmole skin cancer and he permitted me to take 2 biopsies.  The results will be visible to the family on my chart in 2 to 3 days and I encouraged them to call my office the next working day to discuss the result.  Home care is really minimal they can use a little Neosporin or Vaseline on the spot then after a day the bandage is optional.  If there is any problem with it my cell number is [phone].

## 2019-11-21 ENCOUNTER — Encounter (HOSPITAL_BASED_OUTPATIENT_CLINIC_OR_DEPARTMENT_OTHER): Payer: Medicare Other | Attending: Internal Medicine | Admitting: Internal Medicine

## 2019-11-21 DIAGNOSIS — L97321 Non-pressure chronic ulcer of left ankle limited to breakdown of skin: Secondary | ICD-10-CM | POA: Insufficient documentation

## 2019-11-21 DIAGNOSIS — L97521 Non-pressure chronic ulcer of other part of left foot limited to breakdown of skin: Secondary | ICD-10-CM | POA: Insufficient documentation

## 2019-11-21 DIAGNOSIS — I890 Lymphedema, not elsewhere classified: Secondary | ICD-10-CM | POA: Insufficient documentation
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
## 2019-11-21 NOTE — Progress Notes (Signed)
[name], [name] ([id number]) Visit Report for 11/21/2019 Abuse/Suicide Risk Screen Details Patient Name: Date of Service: [name], [name] 11/21/2019 10:30 A M Medical Record Number: [id number] Patient Account Number: [id number] Date of Birth/Sex: Treating RN: Oct 09, 1935 (84 y.o. [name] Primary Care Prabhjot Maddux: Adelfa Koh Other Clinician: Referring Merita Hawks: Treating Ruthie Berch/Extender: Berniece Pap Weeks in Treatment: 0 Abuse/Suicide Risk Screen Items Answer ABUSE RISK SCREEN: Has anyone close to you tried to hurt or harm you recentlyo No Do you feel uncomfortable with anyone in your familyo No Has anyone forced you do things that you didnt want to doo No Electronic Signature(s) Signed: 11/21/2019 4:37:34 PM By: Baruch [name] RN, BSN Entered By: Baruch [name] on 11/21/2019 10:58:11 -------------------------------------------------------------------------------- Activities of Daily Living Details Patient Name: Date of Service: [name], [name] 11/21/2019 10:30 A M Medical Record Number: [id number] Patient Account Number: [id number] Date of Birth/Sex: Treating RN: 07-25-1935 (84 y.o. [name] Primary Care Messi Twedt: Adelfa Koh Other Clinician: Referring Aneira Cavitt: Treating Jenae Tomasello/Extender: Berniece Pap Weeks in Treatment: 0 Activities of Daily Living Items Answer Activities of Daily Living (Please select one for each item) Drive Automobile Not Able T Medications ake Completely Able Use T elephone Completely Able Care for Appearance Completely Able Use T oilet Completely Able Bath / Shower Need Assistance Dress Self Completely Able Feed Self Completely Able Walk Need Assistance Get In / Out Bed Need Assistance Housework Need Assistance Prepare Meals Completely [place]le for Self Need Assistance Electronic Signature(s) Signed: 11/21/2019 4:37:34 PM By: Baruch [name] RN,  BSN Entered By: Baruch [name] on 11/21/2019 10:59:00 -------------------------------------------------------------------------------- Education Screening Details Patient Name: Date of Service: [name], [name] 11/21/2019 10:30 A M Medical Record Number: [id number] Patient Account Number: [id number] Date of Birth/Sex: Treating RN: Jan 09, 1936 (84 y.o. [name] Primary Care Jazae Gandolfi: Adelfa Koh Other Clinician: Referring Armanda Forand: Treating Suanne Minahan/Extender: Camillo Flaming in Treatment: 0 Primary Learner Assessed: Patient Learning Preferences/Education Level/Primary Language Learning Preference: Explanation, Demonstration, Printed Material Highest Education Level: High School Preferred Language: English Cognitive Barrier Language Barrier: No Translator Needed: No Memory Deficit: No Emotional Barrier: No Cultural/Religious Beliefs Affecting Medical Care: No Physical Barrier Impaired Vision: Yes Glasses Impaired Hearing: Yes hard of hearing Decreased Hand dexterity: No Knowledge/Comprehension Knowledge Level: High Comprehension Level: High Ability to understand written instructions: High Ability to understand verbal instructions: High Motivation Anxiety Level: Calm Cooperation: Cooperative Education Importance: Acknowledges Need Interest in Health Problems: Asks Questions Perception: Coherent Willingness to Engage in Self-Management High Activities: Readiness to Engage in Self-Management High Activities: Electronic Signature(s) Signed: 11/21/2019 4:37:34 PM By: Baruch [name] RN, BSN Entered By: Baruch [name] on 11/21/2019 10:59:41 -------------------------------------------------------------------------------- Fall Risk Assessment Details Patient Name: Date of Service: [name], [name] 11/21/2019 10:30 A M Medical Record Number: [id number] Patient Account Number: [id number] Date of Birth/Sex: Treating RN: 1935-07-07 (84 y.o. [name] Primary Care An Schnabel: Adelfa Koh Other Clinician: Referring Daily Doe: Treating Tashera Montalvo/Extender: Berniece Pap Weeks in Treatment: 0 Fall Risk Assessment Items Have you had 2 or more falls in the last 12 monthso 0 No Have you had any fall that resulted in injury in the last 12 monthso 0 No FALLS RISK SCREEN History of falling - immediate or within 3 months 0 No Secondary diagnosis (Do you have 2 or more medical diagnoseso) 0 No Ambulatory aid None/bed rest/wheelchair/nurse 0 No Crutches/cane/walker 15 Yes Furniture 0 No Intravenous  therapy Access/Saline/Heparin Lock 0 No Gait/Transferring Normal/ bed rest/ wheelchair 0 No Weak (short steps with or without shuffle, stooped but able to lift head while walking, may seek 10 Yes support from furniture) Impaired (short steps with shuffle, may have difficulty arising from chair, head down, impaired 0 No balance) Mental Status Oriented to own ability 0 Yes Electronic Signature(s) Signed: 11/21/2019 4:37:34 PM By: Baruch [name] RN, BSN Entered By: Baruch [name] on 11/21/2019 10:59:59 -------------------------------------------------------------------------------- Foot Assessment Details Patient Name: Date of Service: [name], [name] 11/21/2019 10:30 A M Medical Record Number: [id number] Patient Account Number: [id number] Date of Birth/Sex: Treating RN: 1936-01-21 (84 y.o. [name] Primary Care Tynia Wiers: Adelfa Koh Other Clinician: Referring Reha Martinovich: Treating Brennan Litzinger/Extender: Berniece Pap Weeks in Treatment: 0 Foot Assessment Items Site Locations + = Sensation present, - = Sensation absent, C = Callus, U = Ulcer R = Redness, W = Warmth, M = Maceration, PU = Pre-ulcerative lesion F = Fissure, S = Swelling, D = Dryness Assessment Right: Left: Other Deformity: No No Prior Foot Ulcer: No No Prior Amputation: No No Charcot Joint: No No Ambulatory Status:  Ambulatory With Help Assistance Device: Walker Gait: Steady Electronic Signature(s) Signed: 11/21/2019 4:37:34 PM By: Baruch [name] RN, BSN Entered By: Baruch [name] on 11/21/2019 11:04:41 -------------------------------------------------------------------------------- Nutrition Risk Screening Details Patient Name: Date of Service: [name], [name] 11/21/2019 10:30 A M Medical Record Number: [id number] Patient Account Number: [id number] Date of Birth/Sex: Treating RN: 01/29/1936 (84 y.o. [name] Primary Care Yesli Vanderhoff: Adelfa Koh Other Clinician: Referring Dmitriy Gair: Treating Elora Wolter/Extender: Cheree Ditto, SUSA N Weeks in Treatment: 0 Height (in): 75 Weight (lbs): 270 Body Mass Index (BMI): 33.7 Nutrition Risk Screening Items Score Screening NUTRITION RISK SCREEN: I have an illness or condition that made me change the kind and/or amount of food I eat 0 No I eat fewer than two meals per day 0 No I eat few fruits and vegetables, or milk products 0 No I have three or more drinks of beer, liquor or wine almost every day 0 No I have tooth or mouth problems that make it hard for me to eat 0 No I don't always have enough money to buy the food I need 0 No I eat alone most of the time 0 No I take three or more different prescribed or over-the-counter drugs a day 1 Yes Without wanting to, I have lost or gained 10 pounds in the last six months 0 No I am not always physically able to shop, cook and/or feed myself 0 No Nutrition Protocols Good Risk Protocol 0 No interventions needed Moderate Risk Protocol High Risk Proctocol Risk Level: Good Risk Score: 1 Electronic Signature(s) Signed: 11/21/2019 4:37:34 PM By: Baruch [name] RN, BSN Entered By: Baruch [name] on 11/21/2019 11:00:29

## 2019-11-23 NOTE — Progress Notes (Signed)
[name], [name] ([id number]) Visit Report for 11/21/2019 Allergy List Details Patient Name: Date of Service: [name], [name] 11/21/2019 10:30 A M Medical Record Number: [id number] Patient Account Number: [id number] Date of Birth/Sex: Treating RN: 03/03/1935 (84 y.o. [name] Primary Care Domnique Vantine: Adelfa Koh Other Clinician: Referring Austine Kelsay: Treating Cailan Antonucci/Extender: Cheree Ditto, SUSA N Weeks in Treatment: 0 Allergies Active Allergies Sulfa (Sulfonamide Antibiotics) Allergy Notes Electronic Signature(s) Signed: 11/21/2019 4:37:34 PM By: Baruch [name] RN, BSN Entered By: Baruch [name] on 11/21/2019 10:52:59 -------------------------------------------------------------------------------- Arrival Information Details Patient Name: Date of Service: [name], [name] 11/21/2019 10:30 A M Medical Record Number: [id number] Patient Account Number: [id number] Date of Birth/Sex: Treating RN: 10-16-1935 (84 y.o. [name] Primary Care Johannah Rozas: Adelfa Koh Other Clinician: Referring Austin Herd: Treating Aven Cegielski/Extender: Camillo Flaming in Treatment: 0 Visit Information Patient Arrived: Wheel Chair Arrival Time: 10:49 Accompanied By: spouse Transfer Assistance: None Patient Identification Verified: Yes Secondary Verification Process Completed: Yes Patient Requires Transmission-Based Precautions: No Patient Has Alerts: No History Since Last Visit Has Dressing in Place as Prescribed: Yes Has Compression in Place as Prescribed: Yes Electronic Signature(s) Signed: 11/21/2019 4:37:34 PM By: Baruch [name] RN, BSN Entered By: Baruch [name] on 11/21/2019 10:51:56 -------------------------------------------------------------------------------- Clinic Level of Care Assessment Details Patient Name: Date of Service: [name], [name] 11/21/2019 10:30 A M Medical Record Number: [id number] Patient Account Number: [id number] Date of  Birth/Sex: Treating RN: January 09, 1936 (84 y.o. [name]) Carlene Coria Primary Care Tevita Gomer: Adelfa Koh Other Clinician: Referring Ariadna Setter: Treating Spirit Wernli/Extender: Cheree Ditto, SUSA N Weeks in Treatment: 0 Clinic Level of Care Assessment Items TOOL 1 Quantity Score X- 1 0 Use when EandM and Procedure is performed on INITIAL visit ASSESSMENTS - Nursing Assessment / Reassessment X- 1 20 General Physical Exam (combine w/ comprehensive assessment (listed just below) when performed on new pt. evals) X- 1 25 Comprehensive Assessment (HX, ROS, Risk Assessments, Wounds Hx, etc.) ASSESSMENTS - Wound and Skin Assessment / Reassessment []  - 0 Dermatologic / Skin Assessment (not related to wound area) ASSESSMENTS - Ostomy and/or Continence Assessment and Care []  - 0 Incontinence Assessment and Management []  - 0 Ostomy Care Assessment and Management (repouching, etc.) PROCESS - Coordination of Care []  - 0 Simple Patient / Family Education for ongoing care X- 1 20 Complex (extensive) Patient / Family Education for ongoing care X- 1 10 Staff obtains Programmer, systems, Records, T Results / Process Orders est []  - 0 Staff telephones HHA, Nursing Homes / Clarify orders / etc []  - 0 Routine Transfer to another Facility (non-emergent condition) []  - 0 Routine Hospital Admission (non-emergent condition) X- 1 15 New Admissions / Biomedical engineer / Ordering NPWT Apligraf, etc. , []  - 0 Emergency Hospital Admission (emergent condition) PROCESS - Special Needs []  - 0 Pediatric / Minor Patient Management []  - 0 Isolation Patient Management []  - 0 Hearing / Language / Visual special needs []  - 0 Assessment of Community assistance (transportation, D/C planning, etc.) []  - 0 Additional assistance / Altered mentation []  - 0 Support Surface(s) Assessment (bed, cushion, seat, etc.) INTERVENTIONS - Miscellaneous []  - 0 External ear exam []  - 0 Patient Transfer (multiple staff / Librarian, academic / Similar devices) []  - 0 Simple Staple / Suture removal (25 or less) []  - 0 Complex Staple / Suture removal (26 or more) []  - 0 Hypo/Hyperglycemic Management (do not check if billed separately) X- 1 15 Ankle / Brachial Index (ABI) - do not  check if billed separately Has the patient been seen at the hospital within the last three years: Yes Total Score: 105 Level Of Care: New/Established - Level 3 Electronic Signature(s) Signed: 11/21/2019 5:49:00 PM By: Carlene Coria RN Entered By: Carlene Coria on 11/21/2019 11:46:41 -------------------------------------------------------------------------------- Compression Therapy Details Patient Name: Date of Service: [name], [name] 11/21/2019 10:30 A M Medical Record Number: [id number] Patient Account Number: [id number] Date of Birth/Sex: Treating RN: [birth year] (84 y.o. [name]) Carlene Coria Primary Care Jori Frerichs: Adelfa Koh Other Clinician: Referring Gerarda Conklin: Treating Tanetta Fuhriman/Extender: Cheree Ditto, SUSA N Weeks in Treatment: 0 Compression Therapy Performed for Wound Assessment: Wound #6 Left,Medial Ankle Performed By: Clinician Carlene Coria, RN Compression Type: Four Layer Post Procedure Diagnosis Same as Pre-procedure Electronic Signature(s) Signed: 11/21/2019 5:49:00 PM By: Carlene Coria RN Entered By: Carlene Coria on 11/21/2019 11:47:26 -------------------------------------------------------------------------------- Compression Therapy Details Patient Name: Date of Service: [name], [name] 11/21/2019 10:30 A M Medical Record Number: [id number] Patient Account Number: [id number] Date of Birth/Sex: Treating RN: 03-11-1935 (84 y.o. [name]) Carlene Coria Primary Care Davi Rotan: Adelfa Koh Other Clinician: Referring Shaunessy Dobratz: Treating Reiana Poteet/Extender: Cheree Ditto, SUSA N Weeks in Treatment: 0 Compression Therapy Performed for Wound Assessment: Wound #7 Left,Medial Foot Performed By: Clinician Carlene Coria,  RN Compression Type: Four Layer Post Procedure Diagnosis Same as Pre-procedure Electronic Signature(s) Signed: 11/21/2019 5:49:00 PM By: Carlene Coria RN Entered By: Carlene Coria on 11/21/2019 11:47:27 -------------------------------------------------------------------------------- Encounter Discharge Information Details Patient Name: Date of Service: [name], [name] 11/21/2019 10:30 A M Medical Record Number: [id number] Patient Account Number: [id number] Date of Birth/Sex: Treating RN: 02-02-1936 (84 y.o. [name] Primary Care Habeeb Puertas: Adelfa Koh Other Clinician: Referring Dacari Beckstrand: Treating Almer Bushey/Extender: Berniece Pap Weeks in Treatment: 0 Encounter Discharge Information Items Post Procedure Vitals Discharge Condition: Stable Temperature (F): 97.8 Ambulatory Status: Wheelchair Pulse (bpm): 76 Discharge Destination: Home Respiratory Rate (breaths/min): 18 Transportation: Private Auto Blood Pressure (mmHg): 126/63 Accompanied By: wife Schedule Follow-up Appointment: Yes Clinical Summary of Care: Patient Declined Electronic Signature(s) Signed: 11/23/2019 2:03:56 PM By: Levan Hurst RN, BSN Entered By: Levan Hurst on 11/21/2019 11:53:58 -------------------------------------------------------------------------------- Lower Extremity Assessment Details Patient Name: Date of Service: [name], [name] 11/21/2019 10:30 A M Medical Record Number: [id number] Patient Account Number: [id number] Date of Birth/Sex: Treating RN: August 19, 1935 (84 y.o. [name] Primary Care Blade Scheff: Adelfa Koh Other Clinician: Referring Zylie Mumaw: Treating Percival Glasheen/Extender: Berniece Pap Weeks in Treatment: 0 Edema Assessment Assessed: [Left: No] [Right: No] Edema: [Left: Ye] [Right: s] Calf Left: Right: Point of Measurement: From Medial Instep 43.5 cm Ankle Left: Right: Point of Measurement: From Medial Instep 30.3 cm Vascular  Assessment Pulses: Dorsalis Pedis Palpable: [Left:Yes] Blood Pressure: Brachial: [Left:126] Dorsalis Pedis: 128 Ankle: Posterior Tibial: 110 Ankle Brachial Index: [Left:1.02] Electronic Signature(s) Signed: 11/21/2019 4:37:34 PM By: Baruch [name] RN, BSN Entered By: Baruch [name] on 11/21/2019 11:21:37 -------------------------------------------------------------------------------- Multi Wound Chart Details Patient Name: Date of Service: [name], [name] 11/21/2019 10:30 A M Medical Record Number: [id number] Patient Account Number: [id number] Date of Birth/Sex: Treating RN: [birth year] (84 y.o. [name]) Carlene Coria Primary Care Miguel Christiana: Adelfa Koh Other Clinician: Referring Kelse Ploch: Treating Franceska Strahm/Extender: Cheree Ditto, SUSA N Weeks in Treatment: 0 Vital Signs Height(in): 75 Pulse(bpm): 76 Weight(lbs): 270 Blood Pressure(mmHg): 126/63 Body Mass Index(BMI): 34 Temperature(F): 97.8 Respiratory Rate(breaths/min): 18 Photos: [6:No Photos Left, Medial Ankle] [7:No Photos Left, Medial Foot] [N/A:N/A N/A] Wound Location: [6:Blister] [7:Blister] [N/A:N/A] Wounding Event: [6:Lymphedema] [  7:Lymphedema] [N/A:N/A] Primary Etiology: [6:Cataracts, Chronic Obstructive] [7:Cataracts, Chronic Obstructive] [N/A:N/A] Comorbid History: [6:Pulmonary Disease (COPD), Coronary Pulmonary Disease (COPD), Coronary Artery Disease, Hypertension, Peripheral Venous Disease, Osteoarthritis, Received Chemotherapy, Received Radiation Chemotherapy, Received Radiation 10/19/2019]  [7:Artery Disease, Hypertension, Peripheral Venous Disease, Osteoarthritis, Received 10/19/2019] [N/A:N/A] Date Acquired: [6:0] [7:0] [N/A:N/A] Weeks of Treatment: [6:Open] [7:Open] [N/A:N/A] Wound Status: [6:0.3x0.6x0.1] [7:2.3x0.4x0.1] [N/A:N/A] Measurements L x W x D (cm) [6:0.141] [7:0.723] [N/A:N/A] A (cm) : rea [6:0.014] [7:0.072] [N/A:N/A] Volume (cm) : [6:Full Thickness Without Exposed] [7:Full Thickness  Without Exposed] [N/A:N/A] Classification: [6:Support Structures Small] [7:Support Structures Small] [N/A:N/A] Exudate A mount: [6:Serous] [7:Serous] [N/A:N/A] Exudate Type: [6:amber] [7:amber] [N/A:N/A] Exudate Color: [6:Flat and Intact] [7:Flat and Intact] [N/A:N/A] Wound Margin: [6:Large (67-100%)] [7:Large (67-100%)] [N/A:N/A] Granulation A mount: [6:Pink] [7:Pink] [N/A:N/A] Granulation Quality: [6:None Present (0%)] [7:None Present (0%)] [N/A:N/A] Necrotic A mount: [6:Fat Layer (Subcutaneous Tissue): Yes Fat Layer (Subcutaneous Tissue): Yes N/A] Exposed Structures: [6:Fascia: No Tendon: No Muscle: No Joint: No Bone: No Medium (34-66%)] [7:Fascia: No Tendon: No Muscle: No Joint: No Bone: No Small (1-33%)] [N/A:N/A] Epithelialization: [6:Debridement - Selective/Open Wound Debridement - Selective/Open Wound N/A] Debridement: Pre-procedure Verification/Time Out 11:39 [7:11:39] [N/A:N/A] Taken: [6:Lidocaine 5% topical ointment] [7:Lidocaine 5% topical ointment] [N/A:N/A] Pain Control: [6:Slough] [7:Slough] [N/A:N/A] Tissue Debrided: [6:Skin/Epidermis] [7:Skin/Epidermis] [N/A:N/A] Level: [6:0.18] [7:0.92] [N/A:N/A] Debridement A (sq cm): [6:rea Curette] [7:Curette] [N/A:N/A] Instrument: [6:Moderate] [7:Moderate] [N/A:N/A] Bleeding: [6:Pressure] [7:Pressure] [N/A:N/A] Hemostasis A chieved: [6:0] [7:0] [N/A:N/A] Procedural Pain: [6:0] [7:0] [N/A:N/A] Post Procedural Pain: [6:Procedure was tolerated well] [7:Procedure was tolerated well] [N/A:N/A] Debridement Treatment Response: [6:0.3x0.6x0.1] [7:2.3x0.4x1] [N/A:N/A] Post Debridement Measurements L x W x D (cm) [6:0.014] [7:0.723] [N/A:N/A] Post Debridement Volume: (cm) [6:Compression Therapy] [7:Compression Therapy] [N/A:N/A] Procedures Performed: [6:Debridement] [7:Debridement] Treatment Notes Wound #6 (Left, Medial Ankle) 1. Cleanse With Wound Cleanser 3. Primary Dressing Applied Collegen AG 4. Secondary Dressing ABD Pad Dry  Gauze 6. Support Layer Applied 4 layer compression wrap Notes netting Wound #7 (Left, Medial Foot) 1. Cleanse With Wound Cleanser 3. Primary Dressing Applied Collegen [employer] 4. Secondary Dressing ABD Pad Dry Gauze 6. Support Layer Applied 4 layer compression wrap Notes netting Electronic Signature(s) Signed: 11/21/2019 5:41:11 PM By: Linton Ham MD Signed: 11/21/2019 5:49:00 PM By: Carlene Coria RN Entered By: Linton Ham on 11/21/2019 12:42:14 -------------------------------------------------------------------------------- Multi-Disciplinary Care Plan Details Patient Name: Date of Service: [name], [name] 11/21/2019 10:30 A M Medical Record Number: [id number] Patient Account Number: [id number] Date of Birth/Sex: Treating RN: October 12, 1935 (84 y.o. [name]) Carlene Coria Primary Care Shaquana Buel: Adelfa Koh Other Clinician: Referring Teleah Villamar: Treating Terrica Duecker/Extender: Cheree Ditto, SUSA N Weeks in Treatment: 0 Active Inactive Wound/Skin Impairment Nursing Diagnoses: Knowledge deficit related to ulceration/compromised skin integrity Goals: Patient/caregiver will verbalize understanding of skin care regimen Date Initiated: 11/21/2019 Target Resolution Date: 12/22/2019 Goal Status: Active Ulcer/skin breakdown will have a volume reduction of 30% by week 4 Date Initiated: 11/21/2019 Target Resolution Date: 12/22/2019 Goal Status: Active Interventions: Assess patient/caregiver ability to obtain necessary supplies Assess patient/caregiver ability to perform ulcer/skin care regimen upon admission and as needed Assess ulceration(s) every visit Notes: Electronic Signature(s) Signed: 11/21/2019 5:49:00 PM By: Carlene Coria RN Entered By: Carlene Coria on 11/21/2019 11:38:16 -------------------------------------------------------------------------------- Pain Assessment Details Patient Name: Date of Service: [name], [name] 11/21/2019 10:30 A M Medical Record Number:  [id number] Patient Account Number: [id number] Date of Birth/Sex: Treating RN: [birth year] (84 y.o. [name] Primary Care Tierany Appleby: Other Clinician: Adelfa Koh Referring Shanita Kanan: Treating Keoni Risinger/Extender: Cheree Ditto, SUSA N Weeks in  Treatment: 0 Active Problems Location of Pain Severity and Description of Pain Patient Has Paino No Site Locations Rate the pain. Current Pain Level: 0 Pain Management and Medication Current Pain Management: Electronic Signature(s) Signed: 11/21/2019 4:37:34 PM By: Baruch [name] RN, BSN Entered By: Baruch [name] on 11/21/2019 11:12:19 -------------------------------------------------------------------------------- Patient/Caregiver Education Details Patient Name: Date of Service: [name], [name] 10/12/2021andnbsp10:30 A M Medical Record Number: [id number] Patient Account Number: [id number] Date of Birth/Gender: Treating RN: April 23, 1935 (84 y.o. [name]) Carlene Coria Primary Care Physician: Adelfa Koh Other Clinician: Referring Physician: Treating Physician/Extender: Camillo Flaming in Treatment: 0 Education Assessment Education Provided To: Patient Education Topics Provided Wound/Skin Impairment: Methods: Explain/Verbal Responses: State content correctly Electronic Signature(s) Signed: 11/21/2019 5:49:00 PM By: Carlene Coria RN Entered By: Carlene Coria on 11/21/2019 11:38:26 -------------------------------------------------------------------------------- Wound Assessment Details Patient Name: Date of Service: [name], Kerin G. 11/21/2019 10:30 A M Medical Record Number: [id number] Patient Account Number: [id number] Date of Birth/Sex: Treating RN: 1935/07/14 (84 y.o. [name] Primary Care Margerie Fraiser: Adelfa Koh Other Clinician: Referring Yuridiana Formanek: Treating Joshus Rogan/Extender: Berniece Pap Weeks in Treatment: 0 Wound Status Wound Number: 6 Primary  Lymphedema Etiology: Wound Location: Left, Medial Ankle Wound Open Wounding Event: Blister Status: Date Acquired: 10/19/2019 Comorbid Cataracts, Chronic Obstructive Pulmonary Disease (COPD), Weeks Of Treatment: 0 History: Coronary Artery Disease, Hypertension, Peripheral Venous Clustered Wound: No Disease, Osteoarthritis, Received Chemotherapy, Received Radiation Wound Measurements Length: (cm) 0.3 Width: (cm) 0.6 Depth: (cm) 0.1 Area: (cm) 0.141 Volume: (cm) 0.014 % Reduction in Area: % Reduction in Volume: Epithelialization: Medium (34-66%) Tunneling: No Undermining: No Wound Description Classification: Full Thickness Without Exposed Support Structures Wound Margin: Flat and Intact Exudate Amount: Small Exudate Type: Serous Exudate Color: amber Foul Odor After Cleansing: No Slough/Fibrino No Wound Bed Granulation Amount: Large (67-100%) Exposed Structure Granulation Quality: Pink Fascia Exposed: No Necrotic Amount: None Present (0%) Fat Layer (Subcutaneous Tissue) Exposed: Yes Tendon Exposed: No Muscle Exposed: No Joint Exposed: No Bone Exposed: No Treatment Notes Wound #6 (Left, Medial Ankle) 1. Cleanse With Wound Cleanser 3. Primary Dressing Applied Collegen [employer] 4. Secondary Dressing ABD Pad Dry Gauze 6. Support Layer Applied 4 layer compression wrap Notes netting Electronic Signature(s) Signed: 11/21/2019 4:37:34 PM By: Baruch [name] RN, BSN Entered By: Baruch [name] on 11/21/2019 11:11:00 -------------------------------------------------------------------------------- Wound Assessment Details Patient Name: Date of Service: [name], Ringo G. 11/21/2019 10:30 A M Medical Record Number: [id number] Patient Account Number: [id number] Date of Birth/Sex: Treating RN: 11-Mar-1935 (84 y.o. [name] Primary Care Xochitl Egle: Adelfa Koh Other Clinician: Referring Kensleigh Gates: Treating Azaya Goedde/Extender: Berniece Pap Weeks in  Treatment: 0 Wound Status Wound Number: 7 Primary Lymphedema Etiology: Wound Location: Left, Medial Foot Wound Open Wounding Event: Blister Status: Date Acquired: 10/19/2019 Comorbid Cataracts, Chronic Obstructive Pulmonary Disease (COPD), Weeks Of Treatment: 0 History: Coronary Artery Disease, Hypertension, Peripheral Venous Clustered Wound: No Disease, Osteoarthritis, Received Chemotherapy, Received Radiation Wound Measurements Length: (cm) 2.3 Width: (cm) 0.4 Depth: (cm) 0.1 Area: (cm) 0.723 Volume: (cm) 0.072 % Reduction in Area: % Reduction in Volume: Epithelialization: Small (1-33%) Tunneling: No Undermining: No Wound Description Classification: Full Thickness Without Exposed Support Structures Wound Margin: Flat and Intact Exudate Amount: Small Exudate Type: Serous Exudate Color: amber Foul Odor After Cleansing: No Slough/Fibrino No Wound Bed Granulation Amount: Large (67-100%) Exposed Structure Granulation Quality: Pink Fascia Exposed: No Necrotic Amount: None Present (0%) Fat Layer (Subcutaneous Tissue) Exposed: Yes Tendon Exposed: No Muscle Exposed: No Joint  Exposed: No Bone Exposed: No Treatment Notes Wound #7 (Left, Medial Foot) 1. Cleanse With Wound Cleanser 3. Primary Dressing Applied Colle[employer] 4. Secondary Dressing ABD Pad Dry Gauze 6. Support Layer Applied 4 layer compression wrap Notes netting Electronic Signature(s) Signed: 11/21/2019 4:37:34 PM By: Baruch [name] RN, BSN Entered By: Baruch [name] on 11/21/2019 11:12:06 -------------------------------------------------------------------------------- Coward Details Patient Name: Date of Service: [name], Dinero G. 11/21/2019 10:30 A M Medical Record Number: [id number] Patient Account Number: [id number] Date of Birth/Sex: Treating RN: 1935/06/02 (84 y.o. [name] Primary Care Shakayla Hickox: Adelfa Koh Other Clinician: Referring Lamario Mani: Treating Nomie Buchberger/Extender: Berniece Pap Weeks in Treatment: 0 Vital Signs Time Taken: 10:52 Temperature (F): 97.8 Height (in): 75 Pulse (bpm): 76 Source: Stated Respiratory Rate (breaths/min): 18 Weight (lbs): 270 Blood Pressure (mmHg): 126/63 Source: Stated Reference Range: 80 - 120 mg / dl Body Mass Index (BMI): 33.7 Electronic Signature(s) Signed: 11/21/2019 4:37:34 PM By: Baruch [name] RN, BSN Entered By: Baruch [name] on 11/21/2019 10:52:49

## 2019-11-28 ENCOUNTER — Encounter (HOSPITAL_BASED_OUTPATIENT_CLINIC_OR_DEPARTMENT_OTHER): Payer: Medicare Other | Admitting: Internal Medicine

## 2019-11-28 ENCOUNTER — Other Ambulatory Visit: Payer: Self-pay

## 2019-11-28 DIAGNOSIS — L97321 Non-pressure chronic ulcer of left ankle limited to breakdown of skin: Secondary | ICD-10-CM | POA: Diagnosis not present

## 2019-11-28 NOTE — Progress Notes (Signed)
[name], [name] ([id number]) Visit Report for 11/28/2019 Arrival Information Details Patient Name: Date of Service: [name], [name] 11/28/2019 10:30 A M Medical Record Number: [id number] Patient Account Number: [id number] Date of Birth/Sex: Treating RN: 18-Jul-1935 (84 y.o. [name] Primary Care Drago Hammonds: Adelfa Koh Other Clinician: Referring Ada Holness: Treating Johnson Arizola/Extender: Berniece Pap Weeks in Treatment: 1 Visit Information History Since Last Visit Added or deleted any medications: No Patient Arrived: Wheel Chair Any new allergies or adverse reactions: No Arrival Time: 10:30 Had a fall or experienced change in No Accompanied By: wife activities of daily living that may affect Transfer Assistance: None risk of falls: Patient Identification Verified: Yes Signs or symptoms of abuse/neglect since last visito No Secondary Verification Process Completed: Yes Hospitalized since last visit: No Patient Requires Transmission-Based Precautions: No Implantable device outside of the clinic excluding No Patient Has Alerts: No cellular tissue based products placed in the center since last visit: Has Dressing in Place as Prescribed: Yes Has Compression in Place as Prescribed: Yes Pain Present Now: No Electronic Signature(s) Signed: 11/28/2019 11:49:38 AM By: Kela Millin Entered By: Kela Millin on 11/28/2019 10:32:03 -------------------------------------------------------------------------------- Compression Therapy Details Patient Name: Date of Service: [name], [name] 11/28/2019 10:30 A M Medical Record Number: [id number] Patient Account Number: [id number] Date of Birth/Sex: Treating RN: [birth year] (84 y.o. [name] Primary Care Mackenzy Grumbine: Adelfa Koh Other Clinician: Referring British Moyd: Treating Maybelline Kolarik/Extender: Berniece Pap Weeks in Treatment: 1 Compression Therapy Performed for Wound Assessment: Wound #6  Left,Medial Ankle Performed By: Clinician Kela Millin, RN Compression Type: Four Layer Pre Treatment ABI: 1 Post Procedure Diagnosis Same as Pre-procedure Electronic Signature(s) Signed: 11/28/2019 5:04:07 PM By: Deon Pilling Entered By: Deon Pilling on 11/28/2019 10:50:35 -------------------------------------------------------------------------------- Compression Therapy Details Patient Name: Date of Service: [name], [name] 11/28/2019 10:30 A M Medical Record Number: [id number] Patient Account Number: [id number] Date of Birth/Sex: Treating RN: 1936/01/20 (84 y.o. [name] Primary Care Genae Strine: Adelfa Koh Other Clinician: Referring Lenoria Narine: Treating Toree Edling/Extender: Berniece Pap Weeks in Treatment: 1 Compression Therapy Performed for Wound Assessment: Wound #7 Left,Medial Foot Performed By: Clinician Kela Millin, RN Compression Type: Four Layer Pre Treatment ABI: 1 Post Procedure Diagnosis Same as Pre-procedure Electronic Signature(s) Signed: 11/28/2019 5:04:07 PM By: Deon Pilling Entered By: Deon Pilling on 11/28/2019 10:50:35 -------------------------------------------------------------------------------- Encounter Discharge Information Details Patient Name: Date of Service: [name], [name] 11/28/2019 10:30 A M Medical Record Number: [id number] Patient Account Number: [id number] Date of Birth/Sex: Treating RN: [birth year] (84 y.o. [name] Primary Care Mariamawit Depaoli: Adelfa Koh Other Clinician: Referring Alisandra Son: Treating Khylei Wilms/Extender: Berniece Pap Weeks in Treatment: 1 Encounter Discharge Information Items Post Procedure Vitals Discharge Condition: Stable Temperature (F): 97.5 Ambulatory Status: Wheelchair Pulse (bpm): 59 Discharge Destination: Home Respiratory Rate (breaths/min): 18 Transportation: Private Auto Blood Pressure (mmHg): 130/65 Accompanied By: wife Schedule Follow-up  Appointment: Yes Clinical Summary of Care: Patient Declined Electronic Signature(s) Signed: 11/28/2019 11:49:38 AM By: Kela Millin Entered By: Kela Millin on 11/28/2019 11:46:13 -------------------------------------------------------------------------------- Lower Extremity Assessment Details Patient Name: Date of Service: [name], [name] 11/28/2019 10:30 A M Medical Record Number: [id number] Patient Account Number: [id number] Date of Birth/Sex: Treating RN: 1935/11/08 (84 y.o. [name] Primary Care Avaline Stillson: Adelfa Koh Other Clinician: Referring Erina Hamme: Treating Orine Goga/Extender: Cheree Ditto, SUSA [name] Weeks in Treatment: 1 Edema Assessment Assessed: [Left: No] [Right: No] Edema: [Left: Ye] [Right: s] Calf Left: Right:  Point of Measurement: From Medial Instep 40 cm Ankle Left: Right: Point of Measurement: From Medial Instep 27 cm Vascular Assessment Pulses: Dorsalis Pedis Palpable: [Left:Yes] Electronic Signature(s) Signed: 11/28/2019 11:49:38 AM By: Kela Millin Entered By: Kela Millin on 11/28/2019 10:37:23 -------------------------------------------------------------------------------- Multi Wound Chart Details Patient Name: Date of Service: [name], [name] 11/28/2019 10:30 A M Medical Record Number: [id number] Patient Account Number: [id number] Date of Birth/Sex: Treating RN: [birth year] (84 y.o. [name], [name] Primary Care Emmaly Leech: Adelfa Koh Other Clinician: Referring Iridiana Fonner: Treating Serafino Burciaga/Extender: Cheree Ditto, SUSA [name] Weeks in Treatment: 1 Vital Signs Height(in): 75 Pulse(bpm): 73 Weight(lbs): 270 Blood Pressure(mmHg): 130/65 Body Mass Index(BMI): 34 Temperature(F): 97.5 Respiratory Rate(breaths/min): 18 Photos: [6:No Photos Left, Medial Ankle] [7:No Photos Left, Medial Foot] [[name]/A:[name]/A [name]/A] Wound Location: [6:Blister] [7:Blister] [[name]/A:[name]/A] Wounding Event: [6:Lymphedema]  [7:Lymphedema] [[name]/A:[name]/A] Primary Etiology: [6:Cataracts, Chronic Obstructive] [7:Cataracts, Chronic Obstructive] [[name]/A:[name]/A] Comorbid History: [6:Pulmonary Disease (COPD), Coronary Pulmonary Disease (COPD), Coronary Artery Disease, Hypertension, Peripheral Venous Disease, Osteoarthritis, Received Chemotherapy, Received Radiation Chemotherapy, Received Radiation 10/19/2019]  [7:Artery Disease, Hypertension, Peripheral Venous Disease, Osteoarthritis, Received 10/19/2019] [[name]/A:[name]/A] Date Acquired: [6:1] [7:1] [[name]/A:[name]/A] Weeks of Treatment: [6:Open] [7:Open] [[name]/A:[name]/A] Wound Status: [6:0.9x0.9x0.1] [7:2.5x0.5x0.1] [[name]/A:[name]/A] Measurements L x W x D (cm) [6:0.636] [7:0.982] [[name]/A:[name]/A] A (cm) : rea [6:0.064] [7:0.098] [[name]/A:[name]/A] Volume (cm) : [6:-351.10%] [7:-35.80%] [[name]/A:[name]/A] % Reduction in Area: [6:-357.10%] [7:-36.10%] [[name]/A:[name]/A] % Reduction in Volume: [6:Full Thickness Without Exposed] [7:Full Thickness Without Exposed] [[name]/A:[name]/A] Classification: [6:Support Structures Small] [7:Support Structures Small] [[name]/A:[name]/A] Exudate Amount: [6:Serous] [7:Serous] [[name]/A:[name]/A] Exudate Type: [6:amber] [7:amber] [[name]/A:[name]/A] Exudate Color: [6:Flat and Intact] [7:Flat and Intact] [[name]/A:[name]/A] Wound Margin: [6:Medium (34-66%)] [7:Medium (34-66%)] [[name]/A:[name]/A] Granulation Amount: [6:Pink] [7:Pink] [[name]/A:[name]/A] Granulation Quality: [6:Medium (34-66%)] [7:Medium (34-66%)] [[name]/A:[name]/A] Necrotic Amount: [6:Fat Layer (Subcutaneous Tissue): Yes Fat Layer (Subcutaneous Tissue): Yes [name]/A] Exposed Structures: [6:Fascia: No Tendon: No Muscle: No Joint: No Bone: No Medium (34-66%)] [7:Fascia: No Tendon: No Muscle: No Joint: No Bone: No Small (1-33%)] [[name]/A:[name]/A] Epithelialization: [6:Debridement - Excisional] [7:Debridement - Excisional] [[name]/A:[name]/A] Debridement: Pre-procedure Verification/Time Out 10:48 [7:10:48] [[name]/A:[name]/A] Taken: [6:Lidocaine 4% Topical Solution] [7:Lidocaine 4% Topical Solution] [[name]/A:[name]/A] Pain Control: [6:Subcutaneous, Slough]  [7:Subcutaneous, Slough] [[name]/A:[name]/A] Tissue Debrided: [6:Skin/Subcutaneous Tissue] [7:Skin/Subcutaneous Tissue] [[name]/A:[name]/A] Level: [6:0.81] [7:1.25] [[name]/A:[name]/A] Debridement A (sq cm): [6:rea Curette] [7:Curette] [[name]/A:[name]/A] Instrument: [6:Minimum] [7:Minimum] [[name]/A:[name]/A] Bleeding: [6:Pressure] [7:Pressure] [[name]/A:[name]/A] Hemostasis A chieved: [6:0] [7:0] [[name]/A:[name]/A] Procedural Pain: [6:0] [7:0] [[name]/A:[name]/A] Post Procedural Pain: [6:Procedure was tolerated well] [7:Procedure was tolerated well] [[name]/A:[name]/A] Debridement Treatment Response: [6:0.9x0.9x0.1] [7:2.5x1.5x0.1] [[name]/A:[name]/A] Post Debridement Measurements L x W x D (cm) [6:0.064] [7:0.295] [[name]/A:[name]/A] Post Debridement Volume: (cm) [6:Compression Therapy] [7:Compression [place] [[name]/A:[name]/A] Procedures Performed: [6:Debridement] [7:Debridement] Treatment Notes Wound #6 (Left, Medial Ankle) 1. Cleanse With Wound Cleanser Soap and water 2. Periwound Care Moisturizing lotion 3. Primary Dressing Applied Iodoflex 4. Secondary Dressing ABD Pad Dry Gauze 6. Support Layer Applied 4 layer compression wrap Notes netting Wound #7 (Left, Medial Foot) 1. Cleanse With Wound Cleanser Soap and water 2. Periwound Care Moisturizing lotion 3. Primary Dressing Applied Iodoflex 4. Secondary Dressing ABD Pad Dry Gauze 6. Support Layer Applied 4 layer compression wrap Notes netting Electronic Signature(s) Signed: 11/28/2019 4:22:08 PM By: Linton Ham MD Signed: 11/28/2019 5:04:07 PM By: Deon Pilling Entered By: Linton Ham on 11/28/2019 11:55:59 -------------------------------------------------------------------------------- Multi-Disciplinary Care Plan Details Patient Name: Date of Service: [name], [name] 11/28/2019 10:30 A M Medical Record Number: [id number] Patient Account Number: [id number] Date of Birth/Sex: Treating RN: 1935/10/20 (84 y.o. [name] Primary Care Kalief Kattner: Adelfa Koh Other Clinician: Referring  Amadi Frady: Treating  Lilyauna Miedema/Extender: Cheree Ditto, SUSA [name] Weeks in Treatment: 1 Active Inactive Wound/Skin Impairment Nursing Diagnoses: Knowledge deficit related to ulceration/compromised skin integrity Goals: Patient/caregiver will verbalize understanding of skin care regimen Date Initiated: 11/21/2019 Target Resolution Date: 12/22/2019 Goal Status: Active Ulcer/skin breakdown will have a volume reduction of 30% by week 4 Date Initiated: 11/21/2019 Target Resolution Date: 12/22/2019 Goal Status: Active Interventions: Assess patient/caregiver ability to obtain necessary supplies Assess patient/caregiver ability to perform ulcer/skin care regimen upon admission and as needed Assess ulceration(s) every visit Notes: Electronic Signature(s) Signed: 11/28/2019 5:04:07 PM By: Deon Pilling Entered By: Deon Pilling on 11/28/2019 10:42:45 -------------------------------------------------------------------------------- Pain Assessment Details Patient Name: Date of Service: [name], Ranny G. 11/28/2019 10:30 A M Medical Record Number: [id number] Patient Account Number: [id number] Date of Birth/Sex: Treating RN: [birth year] (84 y.o. [name] Primary Care Keilani Terrance: Adelfa Koh Other Clinician: Referring Vincenzina Jagoda: Treating Kileen Lange/Extender: Berniece Pap Weeks in Treatment: 1 Active Problems Location of Pain Severity and Description of Pain Patient Has Paino No Site Locations Pain Management and Medication Current Pain Management: Electronic Signature(s) Signed: 11/28/2019 11:49:38 AM By: Kela Millin Entered By: Kela Millin on 11/28/2019 10:33:07 -------------------------------------------------------------------------------- Patient/Caregiver Education Details Patient Name: Date of Service: [name], Mykael G. 10/19/2021andnbsp10:30 A M Medical Record Number: [id number] Patient Account Number: [id number] Date of Birth/Gender: Treating  RN: [birth year] (84 y.o. [name] Primary Care Physician: Adelfa Koh Other Clinician: Referring Physician: Treating Physician/Extender: Camillo Flaming in Treatment: 1 Education Assessment Education Provided To: Patient Education Topics Provided Wound/Skin Impairment: Handouts: Skin Care Do's and Dont's Methods: Explain/Verbal Responses: Reinforcements needed Electronic Signature(s) Signed: 11/28/2019 5:04:07 PM By: Deon Pilling Entered By: Deon Pilling on 11/28/2019 10:42:55 -------------------------------------------------------------------------------- Wound Assessment Details Patient Name: Date of Service: [name], Isack G. 11/28/2019 10:30 A M Medical Record Number: [id number] Patient Account Number: [id number] Date of Birth/Sex: Treating RN: [birth year] (84 y.o. [name] Primary Care Ridley Schewe: Adelfa Koh Other Clinician: Referring Siddalee Vanderheiden: Treating Jodye Scali/Extender: Berniece Pap Weeks in Treatment: 1 Wound Status Wound Number: 6 Primary Lymphedema Etiology: Wound Location: Left, Medial Ankle Wound Open Wounding Event: Blister Status: Date Acquired: 10/19/2019 Comorbid Cataracts, Chronic Obstructive Pulmonary Disease (COPD), Weeks Of Treatment: 1 History: Coronary Artery Disease, Hypertension, Peripheral Venous Clustered Wound: No Disease, Osteoarthritis, Received Chemotherapy, Received Radiation Wound Measurements Length: (cm) 0.9 Width: (cm) 0.9 Depth: (cm) 0.1 Area: (cm) 0.636 Volume: (cm) 0.064 % Reduction in Area: -351.1% % Reduction in Volume: -357.1% Epithelialization: Medium (34-66%) Tunneling: No Undermining: No Wound Description Classification: Full Thickness Without Exposed Support Structures Wound Margin: Flat and Intact Exudate Amount: Small Exudate Type: Serous Exudate Color: amber Foul Odor After Cleansing: No Slough/Fibrino Yes Wound Bed Granulation Amount: Medium  (34-66%) Exposed Structure Granulation Quality: Pink Fascia Exposed: No Necrotic Amount: Medium (34-66%) Fat Layer (Subcutaneous Tissue) Exposed: Yes Necrotic Quality: Adherent Slough Tendon Exposed: No Muscle Exposed: No Joint Exposed: No Bone Exposed: No Treatment Notes Wound #6 (Left, Medial Ankle) 1. Cleanse With Wound Cleanser Soap and water 2. Periwound Care Moisturizing lotion 3. Primary Dressing Applied Iodoflex 4. Secondary Dressing ABD Pad Dry Gauze 6. Support Layer Applied 4 layer compression wrap Notes netting Electronic Signature(s) Signed: 11/28/2019 11:49:38 AM By: Kela Millin Entered By: Kela Millin on 11/28/2019 10:40:45 -------------------------------------------------------------------------------- Wound Assessment Details Patient Name: Date of Service: [name], Ryler G. 11/28/2019 10:30 A M Medical Record Number: [id number] Patient Account Number: [id number] Date of Birth/Sex: Treating RN: 1935-04-02 (  84 y.o. [name] Primary Care Addalyne Vandehei: Adelfa Koh Other Clinician: Referring Ranata Laughery: Treating Kelby Adell/Extender: Berniece Pap Weeks in Treatment: 1 Wound Status Wound Number: 7 Primary Lymphedema Etiology: Wound Location: Left, Medial Foot Wound Open Wounding Event: Blister Status: Date Acquired: 10/19/2019 Comorbid Cataracts, Chronic Obstructive Pulmonary Disease (COPD), Weeks Of Treatment: 1 History: Coronary Artery Disease, Hypertension, Peripheral Venous Clustered Wound: No Disease, Osteoarthritis, Received Chemotherapy, Received Radiation Wound Measurements Length: (cm) 2.5 Width: (cm) 0.5 Depth: (cm) 0.1 Area: (cm) 0.982 Volume: (cm) 0.098 % Reduction in Area: -35.8% % Reduction in Volume: -36.1% Epithelialization: Small (1-33%) Tunneling: No Undermining: No Wound Description Classification: Full Thickness Without Exposed Support Structures Wound Margin: Flat and Intact Exudate  Amount: Small Exudate Type: Serous Exudate Color: amber Foul Odor After Cleansing: No Slough/Fibrino Yes Wound Bed Granulation Amount: Medium (34-66%) Exposed Structure Granulation Quality: Pink Fascia Exposed: No Necrotic Amount: Medium (34-66%) Fat Layer (Subcutaneous Tissue) Exposed: Yes Necrotic Quality: Adherent Slough Tendon Exposed: No Muscle Exposed: No Joint Exposed: No Bone Exposed: No Treatment Notes Wound #7 (Left, Medial Foot) 1. Cleanse With Wound Cleanser Soap and water 2. Periwound Care Moisturizing lotion 3. Primary Dressing Applied Iodoflex 4. Secondary Dressing ABD Pad Dry Gauze 6. Support Layer Applied 4 layer compression wrap Notes netting Electronic Signature(s) Signed: 11/28/2019 11:49:38 AM By: Kela Millin Entered By: Kela Millin on 11/28/2019 10:41:30 -------------------------------------------------------------------------------- Vitals Details Patient Name: Date of Service: [name], Enmanuel G. 11/28/2019 10:30 A M Medical Record Number: [id number] Patient Account Number: [id number] Date of Birth/Sex: Treating RN: [birth year] (84 y.o. [name] Primary Care Jamesha Ellsworth: Other Clinician: Adelfa Koh Referring Pearla Mckinny: Treating Daivd Fredericksen/Extender: Berniece Pap Weeks in Treatment: 1 Vital Signs Time Taken: 10:30 Temperature (F): 97.5 Height (in): 75 Pulse (bpm): 59 Weight (lbs): 270 Respiratory Rate (breaths/min): 18 Body Mass Index (BMI): 33.7 Blood Pressure (mmHg): 130/65 Reference Range: 80 - 120 mg / dl Electronic Signature(s) Signed: 11/28/2019 11:49:38 AM By: Kela Millin Entered By: Kela Millin on 11/28/2019 10:33:01
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
## 2019-12-05 ENCOUNTER — Other Ambulatory Visit: Payer: Self-pay

## 2019-12-05 ENCOUNTER — Encounter (HOSPITAL_BASED_OUTPATIENT_CLINIC_OR_DEPARTMENT_OTHER): Payer: Medicare Other | Admitting: Internal Medicine

## 2019-12-05 DIAGNOSIS — L97321 Non-pressure chronic ulcer of left ankle limited to breakdown of skin: Secondary | ICD-10-CM | POA: Diagnosis not present

## 2019-12-05 NOTE — Progress Notes (Signed)
WILHO, SHARPLEY ([id number]) Visit Report for 12/05/2019 SuperBill Details Patient Name: Date of Service: [name], [name] 12/05/2019 Medical Record Number: [id number] Patient Account Number: [id number] Date of Birth/Sex: Treating RN: 1935-03-04 (84 y.o. [name] Primary Care Provider: Adelfa Koh Other Clinician: Referring Provider: Treating Provider/Extender: [name] Weeks in Treatment: 2 Diagnosis Coding ICD-10 Codes Code Description I89.0 Lymphedema, not elsewhere classified I87.322 Chronic venous hypertension (idiopathic) with inflammation of left lower extremity L97.321 Non-pressure chronic ulcer of left ankle limited to breakdown of skin L97.521 Non-pressure chronic ulcer of other part of left foot limited to breakdown of skin Facility Procedures CPT4 Code Description Modifier Quantity [phone] (Facility Use Only) [phone] - APPLY MULTLAY COMPRS LWR LT LEG 1 Electronic Signature(s) Signed: 12/05/2019 4:47:47 PM By: Linton Ham MD Signed: 12/05/2019 5:15:49 PM By: Levan Hurst RN, BSN Entered By: Levan Hurst on 12/05/2019 11:54:33

## 2019-12-07 NOTE — Progress Notes (Signed)
OAKLAN, PERSONS ([id number]) Visit Report for 12/05/2019 Arrival Information Details Patient Name: Date of Service: [name], [name] 12/05/2019 10:15 A M Medical Record Number: [id number] Patient Account Number: [id number] Date of Birth/Sex: Treating [name]: 07/08/1935 (84 y.o. [name], [name] Primary Care [name]: [name] Other Clinician: Referring [name]: Treating Laresha Bacorn/Extender: [name] Weeks in Treatment: 2 Visit Information History Since Last Visit Added or deleted any medications: No Patient Arrived: Wheel Chair Any new allergies or adverse reactions: No Arrival Time: 10:06 Had a fall or experienced change in No Accompanied By: wife activities of daily living that may affect Transfer Assistance: None risk of falls: Patient Identification Verified: Yes Signs or symptoms of abuse/neglect since last visito No Secondary Verification Process Completed: Yes Hospitalized since last visit: No Patient Requires Transmission-Based Precautions: No Implantable device outside of the clinic excluding No Patient Has Alerts: No cellular tissue based products placed in the center since last visit: Has Dressing in Place as Prescribed: Yes Pain Present Now: No Electronic Signature(s) Signed: 12/05/2019 3:39:47 PM By: [name] Entered By: [name] on 12/05/2019 10:07:25 -------------------------------------------------------------------------------- Compression Therapy Details Patient Name: Date of Service: [name], [name] 12/05/2019 10:15 A M Medical Record Number: [id number] Patient Account Number: [id number] Date of Birth/Sex: Treating [name]: 06-02-1935 (84 y.o. [name] Primary Care Vincenzina Jagoda: [name] Other Clinician: Referring Stephane Junkins: Treating [name]/Extender: [name] Weeks in Treatment: 2 Compression Therapy Performed for Wound Assessment: Wound #6 Left,Medial Ankle Performed By: Clinician [name] Hurst, [name] Compression Type: Four Layer Electronic Signature(s) Signed: 12/05/2019 5:15:49 PM By: [name], BSN Entered By: [name] on 12/05/2019 11:52:28 -------------------------------------------------------------------------------- Compression Therapy Details Patient Name: Date of Service: [name], [name] 12/05/2019 10:15 A M Medical Record Number: [id number] Patient Account Number: [id number] Date of Birth/Sex: Treating [name]: [birth year] (84 y.o. [name] Primary Care Boss Danielsen: Other Clinician: Adelfa [name] Referring [name]: Treating [name]/Extender: [name] Weeks in Treatment: 2 Compression Therapy Performed for Wound Assessment: Wound #7 Left,Medial Foot Performed By: Clinician [name] Hurst, [name] Compression Type: Four Layer Electronic Signature(s) Signed: 12/05/2019 5:15:49 PM By: [name], BSN Entered By: [name] on 12/05/2019 11:52:29 -------------------------------------------------------------------------------- Encounter Discharge Information Details Patient Name: Date of Service: [name], [name] 12/05/2019 10:15 A M Medical Record Number: [id number] Patient Account Number: [id number] Date of Birth/Sex: Treating [name]: 1935-07-22 (84 y.o. [name] Primary Care [name]: [name] Other Clinician: Referring [name]: Treating [name]/Extender: [name] in Treatment: 2 Encounter Discharge Information Items Discharge Condition: Stable Ambulatory Status: Wheelchair Discharge Destination: Home Transportation: Private Auto Accompanied By: wife Schedule Follow-up Appointment: Yes Clinical Summary of Care: Patient Declined Electronic Signature(s) Signed: 12/05/2019 5:15:49 PM By: [name], BSN Entered By: [name] on 12/05/2019 11:54:24 -------------------------------------------------------------------------------- Wound Assessment  Details Patient Name: Date of Service: [name], [name] 12/05/2019 10:15 A M Medical Record Number: [id number] Patient Account Number: [id number] Date of Birth/Sex: Treating [name]: 1935/05/23 (84 y.o. [name] Primary Care [name]: [name] Other Clinician: Referring [name]: Treating [name]/Extender: [name] Weeks in Treatment: 2 Wound Status Wound Number: 6 Primary Etiology: Lymphedema Wound Location: Left, Medial Ankle Wound Status: Open Wounding Event: Blister Date Acquired: 10/19/2019 Weeks Of Treatment: 2 Clustered Wound: No Wound Measurements Length: (cm) 0.9 Width: (cm) 0.9 Depth: (cm) 0.1 Area: (cm) 0.636 Volume: (cm) 0.064 Wound Description Classification: Full Thickness Without Exposed Support  Structur es % Reduction in Area: -351.1% % Reduction in Volume: -357.1% Treatment Notes Wound #6 (Left, Medial Ankle) 1. Cleanse With Soap and water 2. Periwound Care Moisturizing lotion 3. Primary Dressing Applied Iodoflex 4. Secondary Dressing ABD Pad Dry Gauze 6. Support Layer Applied 4 layer compression wrap Electronic Signature(s) Signed: 12/05/2019 3:39:47 PM By: [name] Signed: 12/07/2019 5:24:43 PM By: [name] Entered By: [name] on 12/05/2019 10:08:38 -------------------------------------------------------------------------------- Wound Assessment Details Patient Name: Date of Service: [name], [name] 12/05/2019 10:15 A M Medical Record Number: [id number] Patient Account Number: [id number] Date of Birth/Sex: Treating [name]: 07-12-1935 (84 y.o. [name] Primary Care [name]: [name] Other Clinician: Referring [name]: Treating [name]/Extender: [name] Weeks in Treatment: 2 Wound Status Wound Number: 7 Primary Etiology: Lymphedema Wound Location: Left, Medial Foot Wound Status: Open Wounding Event: Blister Date Acquired: 10/19/2019 Weeks Of  Treatment: 2 Clustered Wound: No Wound Measurements Length: (cm) 2.5 Width: (cm) 0.5 Depth: (cm) 0.1 Area: (cm) 0.982 Volume: (cm) 0.098 % Reduction in Area: -35.8% % Reduction in Volume: -36.1% Wound Description Classification: Full Thickness Without Exposed Support Structur es Treatment Notes Wound #7 (Left, Medial Foot) 1. Cleanse With Soap and water 2. Periwound Care Moisturizing lotion 3. Primary Dressing Applied Iodoflex 4. Secondary Dressing ABD Pad Dry Gauze 6. Support Layer Applied 4 layer compression wrap Electronic Signature(s) Signed: 12/05/2019 3:39:47 PM By: [name] Signed: 12/07/2019 5:24:43 PM By: [name] Entered By: [name] on 12/05/2019 10:08:39 -------------------------------------------------------------------------------- Vitals Details Patient Name: Date of Service: [name], Shawnn G. 12/05/2019 10:15 A M Medical Record Number: [id number] Patient Account Number: [id number] Date of Birth/Sex: Treating [name]: Feb 27, 1935 (84 y.o. [name] Primary Care Michaelina Blandino: [name] Other Clinician: Referring Lovell Roe: Treating Jacarri Gesner/Extender: [name] Weeks in Treatment: 2 Vital Signs Time Taken: 10:07 Temperature (F): 97.9 Height (in): 75 Pulse (bpm): 60 Weight (lbs): 270 Respiratory Rate (breaths/min): 18 Body Mass Index (BMI): 33.7 Blood Pressure (mmHg): 136/67 Reference Range: 80 - 120 mg / dl Electronic Signature(s) Signed: 12/05/2019 3:39:47 PM By: [name] Entered By: [name] on 12/05/2019 50:09:38

## 2019-12-12 ENCOUNTER — Encounter (HOSPITAL_BASED_OUTPATIENT_CLINIC_OR_DEPARTMENT_OTHER): Payer: Medicare Other | Attending: Internal Medicine | Admitting: Internal Medicine

## 2019-12-12 ENCOUNTER — Other Ambulatory Visit: Payer: Self-pay

## 2019-12-12 DIAGNOSIS — I82411 Acute embolism and thrombosis of right femoral vein: Secondary | ICD-10-CM | POA: Diagnosis not present

## 2019-12-12 DIAGNOSIS — I10 Essential (primary) hypertension: Secondary | ICD-10-CM | POA: Insufficient documentation

## 2019-12-12 DIAGNOSIS — J449 Chronic obstructive pulmonary disease, unspecified: Secondary | ICD-10-CM | POA: Insufficient documentation

## 2019-12-12 DIAGNOSIS — I2510 Atherosclerotic heart disease of native coronary artery without angina pectoris: Secondary | ICD-10-CM | POA: Insufficient documentation

## 2019-12-12 DIAGNOSIS — L97521 Non-pressure chronic ulcer of other part of left foot limited to breakdown of skin: Secondary | ICD-10-CM | POA: Insufficient documentation

## 2019-12-12 DIAGNOSIS — Z955 Presence of coronary angioplasty implant and graft: Secondary | ICD-10-CM | POA: Insufficient documentation

## 2019-12-12 DIAGNOSIS — I714 Abdominal aortic aneurysm, without rupture: Secondary | ICD-10-CM | POA: Diagnosis not present

## 2019-12-12 DIAGNOSIS — Z85038 Personal history of other malignant neoplasm of large intestine: Secondary | ICD-10-CM | POA: Insufficient documentation

## 2019-12-12 DIAGNOSIS — Z86718 Personal history of other venous thrombosis and embolism: Secondary | ICD-10-CM | POA: Diagnosis not present

## 2019-12-12 DIAGNOSIS — I890 Lymphedema, not elsewhere classified: Secondary | ICD-10-CM | POA: Insufficient documentation

## 2019-12-12 DIAGNOSIS — Z7901 Long term (current) use of anticoagulants: Secondary | ICD-10-CM | POA: Insufficient documentation

## 2019-12-12 DIAGNOSIS — N400 Enlarged prostate without lower urinary tract symptoms: Secondary | ICD-10-CM | POA: Insufficient documentation

## 2019-12-12 DIAGNOSIS — L97321 Non-pressure chronic ulcer of left ankle limited to breakdown of skin: Secondary | ICD-10-CM | POA: Insufficient documentation
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
## 2019-12-13 NOTE — Progress Notes (Signed)
[name], [name] ([id number]) Visit Report for 12/12/2019 Arrival Information Details Patient Name: Date of Service: [name], [name] 12/12/2019 12:30 PM Medical Record Number: [id number] Patient Account Number: [id number] Date of Birth/Sex: Treating RN: 07-15-1935 (84 y.o. [name], Meta.Reding Primary Care Virdie Penning: Adelfa Koh Other Clinician: Referring Bay Wayson: Treating Swayze Pries/Extender: Berniece Pap Weeks in Treatment: 3 Visit Information History Since Last Visit Added or deleted any medications: No Patient Arrived: Wheel Chair Any new allergies or adverse reactions: No Arrival Time: 13:00 Had a fall or experienced change in No Accompanied By: wife activities of daily living that may affect Transfer Assistance: None risk of falls: Patient Identification Verified: Yes Signs or symptoms of abuse/neglect since last visito No Secondary Verification Process Completed: Yes Hospitalized since last visit: No Patient Requires Transmission-Based Precautions: No Implantable device outside of the clinic excluding No Patient Has Alerts: No cellular tissue based products placed in the center since last visit: Has Dressing in Place as Prescribed: Yes Has Compression in Place as Prescribed: Yes Pain Present Now: No Electronic Signature(s) Signed: 12/12/2019 4:26:11 PM By: Deon Pilling Entered By: Deon Pilling on 12/12/2019 13:10:41 -------------------------------------------------------------------------------- Compression Therapy Details Patient Name: Date of Service: [name], [name] 12/12/2019 12:30 PM Medical Record Number: [id number] Patient Account Number: [id number] Date of Birth/Sex: Treating RN: May 30, 1935 (84 y.o. [name]) Carlene Coria Primary Care Kaidin Boehle: Adelfa Koh Other Clinician: Referring Pamela Intrieri: Treating Siddhanth Denk/Extender: Cheree Ditto, SUSA [name] Weeks in Treatment: 3 Compression Therapy Performed for Wound Assessment: Wound #6 Left,Medial  Ankle Performed By: Clinician Carlene Coria, RN Compression Type: Four Layer Post Procedure Diagnosis Same as Pre-procedure Electronic Signature(s) Signed: 12/13/2019 9:08:43 AM By: Carlene Coria RN Entered By: Carlene Coria on 12/12/2019 13:34:08 -------------------------------------------------------------------------------- Compression Therapy Details Patient Name: Date of Service: [name], [name] 12/12/2019 12:30 PM Medical Record Number: [id number] Patient Account Number: [id number] Date of Birth/Sex: Treating RN: [birth year] (84 y.o. [name]) Carlene Coria Primary Care Lennis Korb: Adelfa Koh Other Clinician: Referring Antonia Culbertson: Treating Maelee Hoot/Extender: Cheree Ditto, SUSA [name] Weeks in Treatment: 3 Compression Therapy Performed for Wound Assessment: Wound #7 Left,Medial Foot Performed By: Clinician Carlene Coria, RN Compression Type: Four Layer Post Procedure Diagnosis Same as Pre-procedure Electronic Signature(s) Signed: 12/13/2019 9:08:43 AM By: Carlene Coria RN Entered By: Carlene Coria on 12/12/2019 13:34:39 -------------------------------------------------------------------------------- Encounter Discharge Information Details Patient Name: Date of Service: [name], [name] 12/12/2019 12:30 PM Medical Record Number: [id number] Patient Account Number: [id number] Date of Birth/Sex: Treating RN: 18-Dec-1935 (84 y.o. [name] Primary Care Karalyn Kadel: Adelfa Koh Other Clinician: Referring Emlyn Maves: Treating Teena Mangus/Extender: Berniece Pap Weeks in Treatment: 3 Encounter Discharge Information Items Post Procedure Vitals Discharge Condition: Stable Temperature (F): 97.9 Ambulatory Status: Wheelchair Pulse (bpm): 67 Discharge Destination: Home Respiratory Rate (breaths/min): 18 Transportation: Private Auto Blood Pressure (mmHg): 149/71 Accompanied By: wife Schedule Follow-up Appointment: Yes Clinical Summary of Care: Electronic  Signature(s) Signed: 12/12/2019 4:26:11 PM By: Deon Pilling Entered By: Deon Pilling on 12/12/2019 14:09:52 -------------------------------------------------------------------------------- Lower Extremity Assessment Details Patient Name: Date of Service: [name], [name] 12/12/2019 12:30 PM Medical Record Number: [id number] Patient Account Number: [id number] Date of Birth/Sex: Treating RN: 10-Oct-1935 (84 y.o. [name] Primary Care Krisann Mckenna: Adelfa Koh Other Clinician: Referring Akina Maish: Treating Tishawna Larouche/Extender: Cheree Ditto, SUSA [name] Weeks in Treatment: 3 Edema Assessment Assessed: [Left: Yes] [Right: No] Edema: [Left: Ye] [Right: s] Calf Left: Right: Point of Measurement: From Medial Instep 39 cm Ankle Left: Right: Point of  Measurement: From Medial Instep 25 cm Electronic Signature(s) Signed: 12/12/2019 4:26:11 PM By: Deon Pilling Entered By: Deon Pilling on 12/12/2019 13:11:28 -------------------------------------------------------------------------------- Multi Wound Chart Details Patient Name: Date of Service: [name], [name] 12/12/2019 12:30 PM Medical Record Number: [id number] Patient Account Number: [id number] Date of Birth/Sex: Treating RN: 1935/09/25 (84 y.o. [name]) Carlene Coria Primary Care Adalyn Pennock: Adelfa Koh Other Clinician: Referring Mark Benecke: Treating Zamarion Longest/Extender: Cheree Ditto, SUSA [name] Weeks in Treatment: 3 Vital Signs Height(in): 75 Pulse(bpm): 72 Weight(lbs): 270 Blood Pressure(mmHg): 149/71 Body Mass Index(BMI): 34 Temperature(F): 97.9 Respiratory Rate(breaths/min): 18 Photos: [6:No Photos Left, Medial Ankle] [7:No Photos Left, Medial Foot] [[name]/A:[name]/A [name]/A] Wound Location: [6:Blister] [7:Blister] [[name]/A:[name]/A] Wounding Event: [6:Lymphedema] [7:Lymphedema] [[name]/A:[name]/A] Primary Etiology: [6:Cataracts, Chronic Obstructive] [7:Cataracts, Chronic Obstructive] [[name]/A:[name]/A] Comorbid History: [6:Pulmonary Disease (COPD), Coronary  Pulmonary Disease (COPD), Coronary Artery Disease, Hypertension, Peripheral Venous Disease, Osteoarthritis, Received Chemotherapy, Received Radiation Chemotherapy, Received Radiation 10/19/2019]  [7:Artery Disease, Hypertension, Peripheral Venous Disease, Osteoarthritis, Received 10/19/2019] [[name]/A:[name]/A] Date Acquired: [6:3] [7:3] [[name]/A:[name]/A] Weeks of Treatment: [6:Open] [7:Open] [[name]/A:[name]/A] Wound Status: [6:0.9x0.4x0.1] [7:2x1.5x0.1] [[name]/A:[name]/A] Measurements L x W x D (cm) [6:0.283] [7:2.356] [[name]/A:[name]/A] A (cm) : rea [6:0.028] [7:0.236] [[name]/A:[name]/A] Volume (cm) : [6:-100.70%] [7:-225.90%] [[name]/A:[name]/A] % Reduction in Area: [6:-100.00%] [7:-227.80%] [[name]/A:[name]/A] % Reduction in Volume: [6:Full Thickness Without Exposed] [7:Full Thickness Without Exposed] [[name]/A:[name]/A] Classification: [6:Support Structures Medium] [7:Support Structures Medium] [[name]/A:[name]/A] Exudate Amount: [6:Serosanguineous] [7:Serosanguineous] [[name]/A:[name]/A] Exudate Type: [6:red, brown] [7:red, brown] [[name]/A:[name]/A] Exudate Color: [6:Distinct, outline attached] [7:Distinct, outline attached] [[name]/A:[name]/A] Wound Margin: [6:Large (67-100%)] [7:Large (67-100%)] [[name]/A:[name]/A] Granulation Amount: [6:Red, Pink, Pale] [7:Pink, Pale] [[name]/A:[name]/A] Granulation Quality: [6:None Present (0%)] [7:None Present (0%)] [[name]/A:[name]/A] Necrotic Amount: [6:Fat Layer (Subcutaneous Tissue): Yes Fat Layer (Subcutaneous Tissue): Yes [name]/A] Exposed Structures: [6:Fascia: No Tendon: No Muscle: No Joint: No Bone: No Large (67-100%)] [7:Fascia: No Tendon: No Muscle: No Joint: No Bone: No Medium (34-66%)] [[name]/A:[name]/A] Epithelialization: [6:Debridement - Selective/Open Wound Debridement - Selective/Open Wound [name]/A] Debridement: [6:13:26] [7:13:26] [[name]/A:[name]/A] Pre-procedure Verification/Time Out Taken: [6:Lidocaine 5% topical ointment] [7:Lidocaine 5% topical ointment] [[name]/A:[name]/A] Pain Control: [6:Slough] [7:Slough] [[name]/A:[name]/A] Tissue Debrided: [6:Skin/Epidermis] [7:Skin/Epidermis] [[name]/A:[name]/A] Level: [6:0.36] [7:3]  [[name]/A:[name]/A] Debridement A (sq cm): [6:rea Curette] [7:Curette] [[name]/A:[name]/A] Instrument: [6:Minimum] [7:Minimum] [[name]/A:[name]/A] Bleeding: [6:Pressure] [7:Pressure] [[name]/A:[name]/A] Hemostasis A chieved: [6:0] [7:0] [[name]/A:[name]/A] Procedural Pain: [6:0] [7:0] [[name]/A:[name]/A] Post Procedural Pain: [6:Procedure was tolerated well] [7:Procedure was tolerated well] [[name]/A:[name]/A] Debridement Treatment Response: [6:0.9x0.4x0.1] [7:2x1.5x0.1] [[name]/A:[name]/A] Post Debridement Measurements L x W x D (cm) [6:0.028] [7:0.236] [[name]/A:[name]/A] Post Debridement Volume: (cm) [6:Compression Therapy] [7:Compression Therapy] [[name]/A:[name]/A] Procedures Performed: [6:Debridement] [7:Debridement] Treatment Notes Electronic Signature(s) Signed: 12/12/2019 4:57:00 PM By: Linton Ham MD Signed: 12/13/2019 9:08:43 AM By: Carlene Coria RN Entered By: Linton Ham on 12/12/2019 13:46:44 -------------------------------------------------------------------------------- Multi-Disciplinary Care Plan Details Patient Name: Date of Service: [name], [name] 12/12/2019 12:30 PM Medical Record Number: [id number] Patient Account Number: [id number] Date of Birth/Sex: Treating RN: 10-28-1935 (84 y.o. [name]) Carlene Coria Primary Care Eliska Hamil: Adelfa Koh Other Clinician: Referring Sybrina Laning: Treating Enrique Manganaro/Extender: Cheree Ditto, SUSA [name] Weeks in Treatment: 3 Active Inactive Wound/Skin Impairment Nursing Diagnoses: Knowledge deficit related to ulceration/compromised skin integrity Goals: Patient/caregiver will verbalize understanding of skin care regimen Date Initiated: 11/21/2019 Target Resolution Date: 12/22/2019 Goal Status: Active Ulcer/skin breakdown will have a volume reduction of 30% by week 4 Date Initiated: 11/21/2019 Target Resolution Date: 12/22/2019 Goal Status: Active Interventions: Assess patient/caregiver ability to obtain necessary supplies Assess patient/caregiver ability to perform ulcer/skin care regimen upon admission and as  needed Assess ulceration(s) every visit Notes: Electronic Signature(s) Signed:  12/13/2019 9:08:43 AM By: Carlene Coria RN Entered By: Carlene Coria on 12/12/2019 13:04:52 -------------------------------------------------------------------------------- Pain Assessment Details Patient Name: Date of Service: [name], [name] 12/12/2019 12:30 PM Medical Record Number: [id number] Patient Account Number: [id number] Date of Birth/Sex: Treating RN: 1936/01/16 (85 y.o. [name] Primary Care Areal Cochrane: Adelfa Koh Other Clinician: Referring Samanthajo Payano: Treating Harjot Dibello/Extender: Berniece Pap Weeks in Treatment: 3 Active Problems Location of Pain Severity and Description of Pain Patient Has Paino No Site Locations Rate the pain. Current Pain Level: 0 Pain Management and Medication Current Pain Management: Medication: No Cold Application: No Rest: No Massage: No Activity: No T.E.[name].S.: No Heat Application: No Leg drop or elevation: No Is the Current Pain Management Adequate: Adequate How does your wound impact your activities of daily livingo Sleep: No Bathing: No Appetite: No Relationship With Others: No Bladder Continence: No Emotions: No Bowel Continence: No Work: No Toileting: No Drive: No Dressing: No Hobbies: No Electronic Signature(s) Signed: 12/12/2019 4:26:11 PM By: Deon Pilling Entered By: Deon Pilling on 12/12/2019 13:11:13 -------------------------------------------------------------------------------- Patient/Caregiver Education Details Patient Name: Date of Service: [name], [name] 11/2/2021andnbsp12:30 PM Medical Record Number: [id number] Patient Account Number: [id number] Date of Birth/Gender: Treating RN: 1935-03-14 (84 y.o. [name]) Carlene Coria Primary Care Physician: Adelfa Koh Other Clinician: Referring Physician: Treating Physician/Extender: Camillo Flaming in Treatment: 3 Education Assessment Education  Provided To: Patient Education Topics Provided Wound/Skin Impairment: Methods: Explain/Verbal Responses: State content correctly Electronic Signature(s) Signed: 12/13/2019 9:08:43 AM By: Carlene Coria RN Entered By: Carlene Coria on 12/12/2019 13:05:24 -------------------------------------------------------------------------------- Wound Assessment Details Patient Name: Date of Service: [name], [name] 12/12/2019 12:30 PM Medical Record Number: [id number] Patient Account Number: [id number] Date of Birth/Sex: Treating RN: [birth year] (84 y.o. [name] Primary Care Tayjon Halladay: Adelfa Koh Other Clinician: Referring Farrie Sann: Treating Kassidi Elza/Extender: Berniece Pap Weeks in Treatment: 3 Wound Status Wound Number: 6 Primary Lymphedema Etiology: Wound Location: Left, Medial Ankle Wound Open Wounding Event: Blister Status: Date Acquired: 10/19/2019 Comorbid Cataracts, Chronic Obstructive Pulmonary Disease (COPD), Weeks Of Treatment: 3 History: Coronary Artery Disease, Hypertension, Peripheral Venous Clustered Wound: No Disease, Osteoarthritis, Received Chemotherapy, Received Radiation Wound Measurements Length: (cm) 0.9 Width: (cm) 0.4 Depth: (cm) 0.1 Area: (cm) 0.283 Volume: (cm) 0.028 % Reduction in Area: -100.7% % Reduction in Volume: -100% Epithelialization: Large (67-100%) Tunneling: No Undermining: No Wound Description Classification: Full Thickness Without Exposed Support Structures Wound Margin: Distinct, outline attached Exudate Amount: Medium Exudate Type: Serosanguineous Exudate Color: red, brown Foul Odor After Cleansing: No Slough/Fibrino No Wound Bed Granulation Amount: Large (67-100%) Exposed Structure Granulation Quality: Red, Pink, Pale Fascia Exposed: No Necrotic Amount: None Present (0%) Fat Layer (Subcutaneous Tissue) Exposed: Yes Tendon Exposed: No Muscle Exposed: No Joint Exposed: No Bone Exposed: No Treatment  Notes Wound #6 (Left, Medial Ankle) 1. Cleanse With Wound Cleanser Soap and water 2. Periwound Care Moisturizing lotion 3. Primary Dressing Applied Iodoflex 4. Secondary Dressing Dry Gauze 6. Support Layer Applied 4 layer compression wrap Notes netting. Electronic Signature(s) Signed: 12/12/2019 4:26:11 PM By: Deon Pilling Entered By: Deon Pilling on 12/12/2019 13:09:40 -------------------------------------------------------------------------------- Wound Assessment Details Patient Name: Date of Service: [name], [name] 12/12/2019 12:30 PM Medical Record Number: [id number] Patient Account Number: [id number] Date of Birth/Sex: Treating RN: 1935/04/13 (84 y.o. [name] Primary Care Carolie Mcilrath: Adelfa Koh Other Clinician: Referring Lamichael Youkhana: Treating Trenese Haft/Extender: Cheree Ditto, SUSA [name] Weeks in Treatment: 3 Wound Status Wound Number: 7 Primary  Lymphedema Etiology: Wound Location: Left, Medial Foot Wound Open Wounding Event: Blister Status: Date Acquired: 10/19/2019 Comorbid Cataracts, Chronic Obstructive Pulmonary Disease (COPD), Weeks Of Treatment: 3 History: Coronary Artery Disease, Hypertension, Peripheral Venous Clustered Wound: No Disease, Osteoarthritis, Received Chemotherapy, Received Radiation Wound Measurements Length: (cm) 2 Width: (cm) 1.5 Depth: (cm) 0.1 Area: (cm) 2.356 Volume: (cm) 0.236 % Reduction in Area: -225.9% % Reduction in Volume: -227.8% Epithelialization: Medium (34-66%) Tunneling: No Undermining: No Wound Description Classification: Full Thickness Without Exposed Support Structures Wound Margin: Distinct, outline attached Exudate Amount: Medium Exudate Type: Serosanguineous Exudate Color: red, brown Foul Odor After Cleansing: No Slough/Fibrino No Wound Bed Granulation Amount: Large (67-100%) Exposed Structure Granulation Quality: Pink, Pale Fascia Exposed: No Necrotic Amount: None Present (0%) Fat Layer  (Subcutaneous Tissue) Exposed: Yes Tendon Exposed: No Muscle Exposed: No Joint Exposed: No Bone Exposed: No Treatment Notes Wound #7 (Left, Medial Foot) 1. Cleanse With Wound Cleanser Soap and water 2. Periwound Care Moisturizing lotion 3. Primary Dressing Applied Iodoflex 4. Secondary Dressing Dry Gauze 6. Support Layer Applied 4 layer compression wrap Notes netting. Electronic Signature(s) Signed: 12/12/2019 4:26:11 PM By: Deon Pilling Entered By: Deon Pilling on 12/12/2019 13:10:14 -------------------------------------------------------------------------------- Vitals Details Patient Name: Date of Service: [name], Tynell G. 12/12/2019 12:30 PM Medical Record Number: [id number] Patient Account Number: [id number] Date of Birth/Sex: Treating RN: [birth year] (84 y.o. [name] Primary Care Izola Teague: Adelfa Koh Other Clinician: Referring Larna Capelle: Treating Zeinab Rodwell/Extender: Berniece Pap Weeks in Treatment: 3 Vital Signs Time Taken: 13:00 Temperature (F): 97.9 Height (in): 75 Pulse (bpm): 67 Weight (lbs): 270 Respiratory Rate (breaths/min): 18 Body Mass Index (BMI): 33.7 Blood Pressure (mmHg): 149/71 Reference Range: 80 - 120 mg / dl Electronic Signature(s) Signed: 12/12/2019 4:26:11 PM By: Deon Pilling Entered By: Deon Pilling on 12/12/2019 13:11:01

## 2019-12-19 ENCOUNTER — Encounter (HOSPITAL_BASED_OUTPATIENT_CLINIC_OR_DEPARTMENT_OTHER): Payer: Medicare Other | Admitting: Internal Medicine

## 2019-12-19 ENCOUNTER — Other Ambulatory Visit: Payer: Self-pay

## 2019-12-19 DIAGNOSIS — L97521 Non-pressure chronic ulcer of other part of left foot limited to breakdown of skin: Secondary | ICD-10-CM | POA: Diagnosis not present

## 2019-12-19 NOTE — Progress Notes (Signed)
KRISTOFFER, BALA ([id number]) Visit Report for 12/19/2019 Arrival Information Details Patient Name: Date of Service: [name], Gates G. 12/19/2019 11:15 A M Medical Record Number: [id number] Patient Account Number: [id number] Date of Birth/Sex: Treating RN: 04/16/1935 (84 y.o. [name] Primary Care Tishie Altmann: Adelfa Koh Other Clinician: Referring Kevin Mario: Treating Shaine Newmark/Extender: Berniece Pap Weeks in Treatment: 4 Visit Information History Since Last Visit Added or deleted any medications: No Patient Arrived: Wheel Chair Any new allergies or adverse reactions: No Arrival Time: 11:12 Had a fall or experienced change in No Accompanied By: wife activities of daily living that may affect Transfer Assistance: None risk of falls: Patient Identification Verified: Yes Signs or symptoms of abuse/neglect since No Secondary Verification Process Completed: Yes last visito Patient Requires Transmission-Based Precautions: No Hospitalized since last visit: No Patient Has Alerts: No Implantable device outside of the clinic No excluding cellular tissue based products placed in the center since last visit: Has Dressing in Place as Prescribed: Yes Has Compression in Place as Prescribed: Yes Has Footwear/Offloading in Place as Yes Prescribed: Left: Surgical Shoe with Pressure Relief Insole Pain Present Now: No Electronic Signature(s) Signed: 12/19/2019 5:43:21 PM By: Deon Pilling Entered By: Deon Pilling on 12/19/2019 11:40:43 -------------------------------------------------------------------------------- Compression Therapy Details Patient Name: Date of Service: [name], Keno G. 12/19/2019 11:15 A M Medical Record Number: [id number] Patient Account Number: [id number] Date of Birth/Sex: Treating RN: 10/19/1935 (84 y.o. [name] Primary Care Crystal Ellwood: Adelfa Koh Other Clinician: Referring Lou Irigoyen: Treating Jonesha Tsuchiya/Extender: Berniece Pap Weeks in Treatment: 4 Compression Therapy Performed for Wound Assessment: Wound #6 Left,Medial Ankle Performed By: Clinician Deon Pilling, RN Compression Type: Four Layer Pre Treatment ABI: 1 Electronic Signature(s) Signed: 12/19/2019 5:43:21 PM By: Deon Pilling Entered By: Deon Pilling on 12/19/2019 11:41:47 -------------------------------------------------------------------------------- Compression Therapy Details Patient Name: Date of Service: [name], Chadrick G. 12/19/2019 11:15 A M Medical Record Number: [id number] Patient Account Number: [id number] Date of Birth/Sex: Treating RN: Nov 29, 1935 (84 y.o. [name] Primary Care Keaja Reaume: Adelfa Koh Other Clinician: Referring Savas Elvin: Treating Doron Shake/Extender: Berniece Pap Weeks in Treatment: 4 Compression Therapy Performed for Wound Assessment: Wound #7 Left,Medial Foot Performed By: Clinician Deon Pilling, RN Compression Type: Four Layer Pre Treatment ABI: 1 Electronic Signature(s) Signed: 12/19/2019 5:43:21 PM By: Deon Pilling Entered By: Deon Pilling on 12/19/2019 11:41:47 -------------------------------------------------------------------------------- Encounter Discharge Information Details Patient Name: Date of Service: [name], Maxamillion G. 12/19/2019 11:15 A M Medical Record Number: [id number] Patient Account Number: [id number] Date of Birth/Sex: Treating RN: 08/12/1935 (84 y.o. [name] Primary Care Allex Lapoint: Adelfa Koh Other Clinician: Referring Krissie Merrick: Treating Mylie Mccurley/Extender: Camillo Flaming in Treatment: 4 Encounter Discharge Information Items Discharge Condition: Stable Ambulatory Status: Wheelchair Discharge Destination: Home Transportation: Private Auto Accompanied By: self Schedule Follow-up Appointment: Yes Clinical Summary of Care: Electronic Signature(s) Signed: 12/19/2019 5:43:21 PM By: Deon Pilling Entered By: Deon Pilling on  12/19/2019 11:42:36 -------------------------------------------------------------------------------- Patient/Caregiver Education Details Patient Name: Date of Service: [name], Roald G. 11/9/2021andnbsp11:15 A M Medical Record Number: [id number] Patient Account Number: [id number] Date of Birth/Gender: Treating RN: [birth year] (84 y.o. [name] Primary Care Physician: Adelfa Koh Other Clinician: Referring Physician: Treating Physician/Extender: Camillo Flaming in Treatment: 4 Education Assessment Education Provided To: Patient Education Topics Provided Wound/Skin Impairment: Handouts: Skin Care Do's and Dont's Methods: Explain/Verbal Responses: Reinforcements needed Electronic Signature(s) Signed: 12/19/2019 5:43:21 PM By: Deon Pilling Entered By: Rolin Barry  Bobbi on 12/19/2019 11:42:28 -------------------------------------------------------------------------------- Wound Assessment Details Patient Name: Date of Service: [name], Davyon G. 12/19/2019 11:15 A M Medical Record Number: [id number] Patient Account Number: [id number] Date of Birth/Sex: Treating RN: [birth year] (84 y.o. [name] Primary Care Danh Bayus: Adelfa Koh Other Clinician: Referring Lea Baine: Treating Vienne Corcoran/Extender: Berniece Pap Weeks in Treatment: 4 Wound Status Wound Number: 6 Primary Etiology: Lymphedema Wound Location: Left, Medial Ankle Wound Status: Open Wounding Event: Blister Date Acquired: 10/19/2019 Weeks Of Treatment: 4 Clustered Wound: No Wound Measurements Length: (cm) 0.9 Width: (cm) 0.4 Depth: (cm) 0.1 Area: (cm) 0.283 Volume: (cm) 0.028 % Reduction in Area: -100.7% % Reduction in Volume: -100% Wound Description Classification: Full Thickness Without Exposed Support Structu res Treatment Notes Wound #6 (Left, Medial Ankle) 1. Cleanse With Wound Cleanser Soap and water 2. Periwound Care Moisturizing lotion 3. Primary Dressing  Applied Iodoflex 4. Secondary Dressing Dry Gauze 6. Support Layer Applied 4 layer compression wrap Notes netting. Electronic Signature(s) Signed: 12/19/2019 5:43:21 PM By: Deon Pilling Entered By: Deon Pilling on 12/19/2019 11:41:24 -------------------------------------------------------------------------------- Wound Assessment Details Patient Name: Date of Service: [name], Marquay G. 12/19/2019 11:15 A M Medical Record Number: [id number] Patient Account Number: [id number] Date of Birth/Sex: Treating RN: April 03, 1935 (84 y.o. [name] Primary Care Jaiveer Panas: Adelfa Koh Other Clinician: Referring Savilla Turbyfill: Treating Paulmichael Schreck/Extender: Berniece Pap Weeks in Treatment: 4 Wound Status Wound Number: 7 Primary Etiology: Lymphedema Wound Location: Left, Medial Foot Wound Status: Open Wounding Event: Blister Date Acquired: 10/19/2019 Weeks Of Treatment: 4 Clustered Wound: No Wound Measurements Length: (cm) 2 Width: (cm) 1.5 Depth: (cm) 0.1 Area: (cm) 2.356 Volume: (cm) 0.236 % Reduction in Area: -225.9% % Reduction in Volume: -227.8% Wound Description Classification: Full Thickness Without Exposed Support Structu res Treatment Notes Wound #7 (Left, Medial Foot) 1. Cleanse With Wound Cleanser Soap and water 2. Periwound Care Moisturizing lotion 3. Primary Dressing Applied Iodoflex 4. Secondary Dressing Dry Gauze 6. Support Layer Applied 4 layer compression wrap Notes netting. Electronic Signature(s) Signed: 12/19/2019 5:43:21 PM By: Deon Pilling Entered By: Deon Pilling on 12/19/2019 11:41:24 -------------------------------------------------------------------------------- Vitals Details Patient Name: Date of Service: [name], Taeveon G. 12/19/2019 11:15 A M Medical Record Number: [id number] Patient Account Number: [id number] Date of Birth/Sex: Treating RN: [birth year] (84 y.o. [name] Primary Care Sheli Dorin: Adelfa Koh Other  Clinician: Referring Parris Signer: Treating Isao Seltzer/Extender: Berniece Pap Weeks in Treatment: 4 Vital Signs Time Taken: 11:13 Temperature (F): 98.1 Height (in): 75 Pulse (bpm): 67 Weight (lbs): 270 Respiratory Rate (breaths/min): 20 Body Mass Index (BMI): 33.7 Blood Pressure (mmHg): 138/78 Reference Range: 80 - 120 mg / dl Electronic Signature(s) Signed: 12/19/2019 5:43:21 PM By: Deon Pilling Entered By: Deon Pilling on 12/19/2019 11:41:03

## 2019-12-20 NOTE — Progress Notes (Signed)
LONDYN, WOTTON ([id number]) Visit Report for 12/19/2019 SuperBill Details Patient Name: Date of Service: [name], [place] G. 12/19/2019 Medical Record Number: [id number] Patient Account Number: [id number] Date of Birth/Sex: Treating RN: [birth year] (84 y.o. 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

## 2019-12-26 ENCOUNTER — Encounter (HOSPITAL_BASED_OUTPATIENT_CLINIC_OR_DEPARTMENT_OTHER): Payer: Medicare Other | Admitting: Internal Medicine

## 2019-12-26 ENCOUNTER — Other Ambulatory Visit: Payer: Self-pay

## 2019-12-26 DIAGNOSIS — L97521 Non-pressure chronic ulcer of other part of left foot limited to breakdown of skin: Secondary | ICD-10-CM | POA: Diagnosis not present

## 2019-12-26 NOTE — Progress Notes (Signed)
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 In any event always felt that we didn't exactly explain this man's presentation. I have no doubt he has lymphedema chronic venous disease but the pattern is bilateral extensive wounds really in my mind was not compatible with this. Nevertheless his wounds are now healed 4/13; we discharge this patient 2 weeks ago. He has a history of chronic venous insufficiency and lymphedema with severe bilateral necrotic wounds that were felt secondary to cellulitis in his lower extremities. It took a long period of time to get all of this to close. His wife called urgently last week to report a rash on his anterior lower extremities bilaterally. We are only able to get him in today. His wife showed me pictures on the phone. Apparently he had been sitting in the sun for perhaps 2 hours but he had his compression stockings on. He developed a superficial erythematous rash with what look like macules on the right leg more superiorly. This  was not painful. His wife states that she had been using a different type of soap on his lower extremities [Dial}. Wonders if this could have been some form of contact dermatitis. The rash is faded and his legs look back to normal. READMISSION 11/21/2019 This is a patient we had for several months  at the end of 2020 into the beginning of this year. He had bilateral wounds on his lower legs in the setting of chronic venous insufficiency and lymphedema. We discharged him with Wallie Char wraps stockings that he is using religiously. According to his wife everything was fine until the beginning of September he developed 2 blisters on the left medial ankle area. These open into wounds. He saw his primary doctor a culture was done of the area that showed heavy growth of methicillin sensitive staph aureus. He has completed doxycycline. They came in with simply the wraps on no additional dressings. Past medical history includes chronic venous insufficiency with lymphedema DVT of the right femoral vein I think this is remote, COPD, coronary artery disease, history of colon CA treated with surgery and radiation and skin cancer ABI in our clinic was 1.02 on the left 10/19; patient has 2 wounds on his left medial heel/ankle. These may have been infectious in etiology. He does have chronic venous insufficiency with lymphedema. We use silver collagen under compression, change this today to Iodoflex His wife brings in some lab work today from 9/29. This showed a normal comprehensive metabolic panel other than a slightly elevated BUN at 23. White count at 8.74 hemoglobin at 13.4 platelet count normal at 310 11/2; the wound areas has morphed into when he left medial heel and ankle. Most of this is fully epithelialized. Surface debris. He has good edema control. He wears a Farrow wrap on the right leg he has 1 for the left leg 11/16; left medial ankle and heel are both healed. Good edema control. He has a Farrow wrap  for the right leg and one in waiting for the left leg that he can start using now Objective Constitutional Sitting or standing Blood Pressure is within target range for patient.. Pulse regular and within target range for patient.Marland Kitchen Respirations regular, non-labored and within target range.. Temperature is normal and within the target range for the patient.Marland Kitchen Appears in no distress. Vitals Time Taken: 10:35 AM, Height: 75 in, Weight: 270 lbs, BMI: 33.7, Temperature: 98.7 F, Pulse: 56 bpm, Respiratory Rate: 18 breaths/min, Blood Pressure: 135/75 mmHg. Cardiovascular Pedal pulses on the left are palpable. General Notes: Wound exam; left medial ankle foot are both clean. Everything is epithelialized. We have decent edema control. No surrounding infection Integumentary (Hair, Skin) Wound #6 status is Open. Original cause of wound was Blister. The wound is located on the Left,Medial Ankle. The wound measures 0cm length x 0cm width x 0cm depth; 0cm^2 area and 0cm^3 volume. Wound #7 status is Open. Original cause of wound was Blister. The wound is located on the Left,Medial Foot. The wound measures 0cm length x 0cm width x 0cm depth; 0cm^2 area and 0cm^3 volume. 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 The patient can  be discharged from the wound care center. 2. He has support  stockings which he should wear to 30 to 40 mm of pressure 3. Counseled to lubricate his skin nightly Electronic Signature(s) Signed: 12/26/2019 4:54:15 PM By: Linton Ham MD Entered By: Linton Ham on 12/26/2019 12:30:03 -------------------------------------------------------------------------------- SuperBill Details Patient Name: Date of Service: [name], [name] 12/26/2019 Medical Record Number: [id number] Patient Account Number: [id number] Date of Birth/Sex: Treating RN: 1935-11-17 (84 y.o. [name]) Carlene Coria Primary Care Provider: Adelfa Koh Other Clinician: Referring Provider: Treating Provider/Extender: Cheree Ditto, Michelle Nasuti Weeks in Treatment: 5 Diagnosis Coding ICD-10 Codes Code Description I89.0 Lymphedema, not elsewhere classified I87.322 Chronic venous hypertension (idiopathic) with inflammation of left lower extremity L97.321 Non-pressure chronic ulcer of left ankle limited to breakdown of skin L97.521 Non-pressure chronic ulcer of other part of left foot limited to breakdown of skin Facility Procedures CPT4 Code: [id number] Description: [phone] - WOUND CARE VISIT-LEV 2 EST PT Modifier: Quantity: 1 Physician Procedures : CPT4 Code Description Modifier [id number] - WC PHYS LEVEL 2 - EST PT ICD-10 Diagnosis Description I89.0 Lymphedema, not elsewhere classified I87.322 Chronic venous hypertension (idiopathic) with inflammation of left lower extremity L97.321  Non-pressure chronic ulcer of left ankle limited to breakdown of skin L97.521 Non-pressure chronic ulcer of other 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

## 2019-12-27 NOTE — Progress Notes (Signed)
[name] ([id number]) Visit Report for 12/26/2019 Arrival Information Details Patient Name: Date of Service: [name], [name] 12/26/2019 10:30 A M Medical Record Number: [id number] Patient Account Number: [id number] Date of Birth/Sex: Treating RN: 07-22-1935 (84 y.o. [name], Meta.Reding Primary Care Stina Gane: Adelfa Koh Other Clinician: Referring Tecumseh Yeagley: Treating Carrianne Hyun/Extender: Berniece Pap Weeks in Treatment: 5 Visit Information History Since Last Visit Added or deleted any medications: No Patient Arrived: Wheel Chair Any new allergies or adverse reactions: No Arrival Time: 10:35 Had a fall or experienced change in No Accompanied By: daughter activities of daily living that may affect Transfer Assistance: None risk of falls: Patient Identification Verified: Yes Signs or symptoms of abuse/neglect since last visito No Secondary Verification Process Completed: Yes Hospitalized since last visit: No Patient Requires Transmission-Based Precautions: No Implantable device outside of the clinic excluding No Patient Has Alerts: No cellular tissue based products placed in the center since last visit: Has Dressing in Place as Prescribed: Yes Has Compression in Place as Prescribed: Yes Pain Present Now: No Electronic Signature(s) Signed: 12/26/2019 12:08:35 PM By: Deon Pilling Entered By: Deon Pilling on 12/26/2019 10:41:26 -------------------------------------------------------------------------------- Clinic Level of Care Assessment Details Patient Name: Date of Service: [name], [name] 12/26/2019 10:30 A M Medical Record Number: [id number] Patient Account Number: [id number] Date of Birth/Sex: Treating RN: 26-Dec-1935 (84 y.o. [name]) Carlene Coria Primary Care Mekesha Solomon: Adelfa Koh Other Clinician: Referring Oprah Camarena: Treating Lenaya Pietsch/Extender: Cheree Ditto, Michelle Nasuti Weeks in Treatment: 5 Clinic Level of Care Assessment Items TOOL 4 Quantity  Score X- 1 0 Use when only an EandM is performed on FOLLOW-UP visit ASSESSMENTS - Nursing Assessment / Reassessment X- 1 10 Reassessment of Co-morbidities (includes updates in patient status) X- 1 5 Reassessment of Adherence to Treatment Plan ASSESSMENTS - Wound and Skin A ssessment / Reassessment X - Simple Wound Assessment / Reassessment - one wound 1 5 []  - 0 Complex Wound Assessment / Reassessment - multiple wounds []  - 0 Dermatologic / Skin Assessment (not related to wound area) ASSESSMENTS - Focused Assessment []  - 0 Circumferential Edema Measurements - multi extremities []  - 0 Nutritional Assessment / Counseling / Intervention []  - 0 Lower Extremity Assessment (monofilament, tuning fork, pulses) []  - 0 Peripheral Arterial Disease Assessment (using hand held doppler) ASSESSMENTS - Ostomy and/or Continence Assessment and Care []  - 0 Incontinence Assessment and Management []  - 0 Ostomy Care Assessment and Management (repouching, etc.) PROCESS - Coordination of Care X - Simple Patient / Family Education for ongoing care 1 15 []  - 0 Complex (extensive) Patient / Family Education for ongoing care []  - 0 Staff obtains Programmer, systems, Records, T Results / Process Orders est []  - 0 Staff telephones HHA, Nursing Homes / Clarify orders / etc []  - 0 Routine Transfer to another Facility (non-emergent condition) []  - 0 Routine Hospital Admission (non-emergent condition) []  - 0 New Admissions / Biomedical engineer / Ordering NPWT Apligraf, etc. , []  - 0 Emergency Hospital Admission (emergent condition) X- 1 10 Simple Discharge Coordination []  - 0 Complex (extensive) Discharge Coordination PROCESS - Special Needs []  - 0 Pediatric / Minor Patient Management []  - 0 Isolation Patient Management []  - 0 Hearing / Language / Visual special needs []  - 0 Assessment of Community assistance (transportation, D/C planning, etc.) []  - 0 Additional assistance / Altered  mentation []  - 0 Support Surface(s) Assessment (bed, cushion, seat, etc.) INTERVENTIONS - Wound Cleansing / Measurement X - Simple Wound Cleansing - one  wound 1 5 []  - 0 Complex Wound Cleansing - multiple wounds X- 1 5 Wound Imaging (photographs - any number of wounds) []  - 0 Wound Tracing (instead of photographs) X- 1 5 Simple Wound Measurement - one wound []  - 0 Complex Wound Measurement - multiple wounds INTERVENTIONS - Wound Dressings []  - 0 Small Wound Dressing one or multiple wounds []  - 0 Medium Wound Dressing one or multiple wounds []  - 0 Large Wound Dressing one or multiple wounds []  - 0 Application of Medications - topical []  - 0 Application of Medications - injection INTERVENTIONS - Miscellaneous []  - 0 External ear exam []  - 0 Specimen Collection (cultures, biopsies, blood, body fluids, etc.) []  - 0 Specimen(s) / Culture(s) sent or taken to Lab for analysis []  - 0 Patient Transfer (multiple staff / Civil Service fast streamer / Similar devices) []  - 0 Simple Staple / Suture removal (25 or less) []  - 0 Complex Staple / Suture removal (26 or more) []  - 0 Hypo / Hyperglycemic Management (close monitor of Blood Glucose) []  - 0 Ankle / Brachial Index (ABI) - do not check if billed separately X- 1 5 Vital Signs Has the patient been seen at the hospital within the last three years: Yes Total Score: 65 Level Of Care: New/Established - Level 2 Electronic Signature(s) Signed: 12/26/2019 5:00:30 PM By: Carlene Coria RN Entered By: Carlene Coria on 12/26/2019 11:26:22 -------------------------------------------------------------------------------- Encounter Discharge Information Details Patient Name: Date of Service: [name], [name] 12/26/2019 10:30 A M Medical Record Number: [id number] Patient Account Number: [id number] Date of Birth/Sex: Treating RN: 1935-04-30 (84 y.o. [name] Primary Care Darrius Montano: Adelfa Koh Other Clinician: Referring Evadene Wardrip: Treating  Sheriece Jefcoat/Extender: Camillo Flaming in Treatment: 5 Encounter Discharge Information Items Discharge Condition: Stable Ambulatory Status: Wheelchair Discharge Destination: Home Transportation: Private Auto Accompanied By: daughter Schedule Follow-up Appointment: No Clinical Summary of Care: Patient Declined Electronic Signature(s) Signed: 12/27/2019 5:17:00 PM By: Levan Hurst RN, BSN Entered By: Levan Hurst on 12/26/2019 11:43:50 -------------------------------------------------------------------------------- Lower Extremity Assessment Details Patient Name: Date of Service: [name], [name] 12/26/2019 10:30 A M Medical Record Number: [id number] Patient Account Number: [id number] Date of Birth/Sex: Treating RN: November 03, 1935 (84 y.o. [name] Primary Care Glendal Cassaday: Adelfa Koh Other Clinician: Referring Thurza Kwiecinski: Treating Basya Casavant/Extender: Cheree Ditto, SUSA N Weeks in Treatment: 5 Edema Assessment Assessed: [Left: Yes] [Right: No] Edema: [Left: Ye] [Right: s] Calf Left: Right: Point of Measurement: From Medial Instep 40 cm Ankle Left: Right: Point of Measurement: From Medial Instep 25.5 cm Vascular Assessment Pulses: Dorsalis Pedis Palpable: [Left:Yes] Electronic Signature(s) Signed: 12/26/2019 12:08:35 PM By: Deon Pilling Entered By: Deon Pilling on 12/26/2019 10:41:57 -------------------------------------------------------------------------------- Multi Wound Chart Details Patient Name: Date of Service: [name], Trevionne G. 12/26/2019 10:30 A M Medical Record Number: [id number] Patient Account Number: [id number] Date of Birth/Sex: Treating RN: 19-Apr-1935 (84 y.o. [name]) Carlene Coria Primary Care Garth Diffley: Adelfa Koh Other Clinician: Referring Donni Oglesby: Treating Jacqueleen Pulver/Extender: Cheree Ditto, SUSA N Weeks in Treatment: 5 Vital Signs Height(in): 75 Pulse(bpm): 56 Weight(lbs): 270 Blood Pressure(mmHg): 135/75 Body  Mass Index(BMI): 34 Temperature(F): 98.7 Respiratory Rate(breaths/min): 18 Photos: [6:No Photos Left, Medial Ankle] [7:No Photos Left, Medial Foot] [N/A:N/A N/A] Wound Location: [6:Blister] [7:Blister] [N/A:N/A] Wounding Event: [6:Lymphedema] [7:Lymphedema] [N/A:N/A] Primary Etiology: [6:10/19/2019] [7:10/19/2019] [N/A:N/A] Date Acquired: [6:5] [7:5] [N/A:N/A] Weeks of Treatment: [6:Open] [7:Open] [N/A:N/A] Wound Status: [6:0x0x0] [7:0x0x0] [N/A:N/A] Measurements L x W x D (cm) [6:0] [7:0] [N/A:N/A] A (cm) : rea [6:0] [7:0] [  N/A:N/A] Volume (cm) : [6:100.00%] [7:100.00%] [N/A:N/A] % Reduction in A rea: [6:100.00%] [7:100.00%] [N/A:N/A] % Reduction in Volume: [6:Full Thickness Without Exposed] [7:Full Thickness Without Exposed] [N/A:N/A] Classification: [6:Support Structures] [7:Support Structures] Treatment Notes Electronic Signature(s) Signed: 12/26/2019 4:54:15 PM By: Linton Ham MD Signed: 12/26/2019 5:00:30 PM By: Carlene Coria RN Entered By: Linton Ham on 12/26/2019 12:27:55 -------------------------------------------------------------------------------- Multi-Disciplinary Care Plan Details Patient Name: Date of Service: [name], [name] 12/26/2019 10:30 A M Medical Record Number: [id number] Patient Account Number: [id number] Date of Birth/Sex: Treating RN: December 07, 1935 (84 y.o. [name] Primary Care Hawley Michel: Adelfa Koh Other Clinician: Referring Daleisa Halperin: Treating Tyreanna Bisesi/Extender: Cheree Ditto, SUSA N Weeks in Treatment: 5 Active Inactive Electronic Signature(s) Signed: 12/26/2019 5:00:30 PM By: Carlene Coria RN Entered By: Carlene Coria on 12/26/2019 11:25:06 -------------------------------------------------------------------------------- Pain Assessment Details Patient Name: Date of Service: [name], [name] 12/26/2019 10:30 A M Medical Record Number: [id number] Patient Account Number: [id number] Date of Birth/Sex: Treating RN: 18-May-1935  (84 y.o. [name] Primary Care Taijon Vink: Adelfa Koh Other Clinician: Referring Nickayla Mcinnis: Treating Hadia Minier/Extender: Berniece Pap Weeks in Treatment: 5 Active Problems Location of Pain Severity and Description of Pain Patient Has Paino No Site Locations Rate the pain. Current Pain Level: 0 Pain Management and Medication Current Pain Management: Medication: No Cold Application: No Rest: No Massage: No Activity: No T.E.N.S.: No Heat Application: No Leg drop or elevation: No Is the Current Pain Management Adequate: Adequate How does your wound impact your activities of daily livingo Sleep: No Bathing: No Appetite: No Relationship With Others: No Bladder Continence: No Emotions: No Bowel Continence: No Work: No Toileting: No Drive: No Dressing: No Hobbies: No Electronic Signature(s) Signed: 12/26/2019 12:08:35 PM By: Deon Pilling Entered By: Deon Pilling on 12/26/2019 10:41:48 -------------------------------------------------------------------------------- Patient/Caregiver Education Details Patient Name: Date of Service: [name], [name] 11/16/2021andnbsp10:30 A M Medical Record Number: [id number] Patient Account Number: [id number] Date of Birth/Gender: Treating RN: 24-Jun-1935 (84 y.o. [name]) Carlene Coria Primary Care Physician: Adelfa Koh Other Clinician: Referring Physician: Treating Physician/Extender: Camillo Flaming in Treatment: 5 Education Assessment Education Provided To: Patient Education Topics Provided Wound/Skin Impairment: Methods: Explain/Verbal Responses: State content correctly Electronic Signature(s) Signed: 12/26/2019 5:00:30 PM By: Carlene Coria RN Entered By: Carlene Coria on 12/26/2019 10:34:11 -------------------------------------------------------------------------------- Wound Assessment Details Patient Name: Date of Service: [name], [name] 12/26/2019 10:30 A M Medical Record  Number: [id number] Patient Account Number: [id number] Date of Birth/Sex: Treating RN: 1935-02-28 (84 y.o. [name] Primary Care Oneita Allmon: Adelfa Koh Other Clinician: Referring Siraj Dermody: Treating Marlies Ligman/Extender: Cheree Ditto, Michelle Nasuti Weeks in Treatment: 5 Wound Status Wound Number: 6 Primary Etiology: Lymphedema Wound Location: Left, Medial Ankle Wound Status: Open Wounding Event: Blister Date Acquired: 10/19/2019 Weeks Of Treatment: 5 Clustered Wound: No Wound Measurements Length: (cm) Width: (cm) Depth: (cm) Area: (cm) Volume: (cm) 0 % Reduction in Area: 100% 0 % Reduction in Volume: 100% 0 0 0 Wound Description Classification: Full Thickness Without Exposed Support Structur es Electronic Signature(s) Signed: 12/26/2019 12:08:35 PM By: Deon Pilling Entered By: Deon Pilling on 12/26/2019 10:42:06 -------------------------------------------------------------------------------- Wound Assessment Details Patient Name: Date of Service: [name], [name] 12/26/2019 10:30 A M Medical Record Number: [id number] Patient Account Number: [id number] Date of Birth/Sex: Treating RN: 05/19/1935 (84 y.o. [name] Primary Care Exavior Kimmons: Adelfa Koh Other Clinician: Referring Zaul Hubers: Treating Shelise Maron/Extender: Cheree Ditto, SUSA N Weeks in Treatment: 5 Wound Status Wound Number: 7 Primary  Etiology: Lymphedema Wound Location: Left, Medial Foot Wound Status: Open Wounding Event: Blister Date Acquired: 10/19/2019 Weeks Of Treatment: 5 Clustered Wound: No Wound Measurements Length: (cm) Width: (cm) Depth: (cm) Area: (cm) Volume: (cm) 0 % Reduction in Area: 100% 0 % Reduction in Volume: 100% 0 0 0 Wound Description Classification: Full Thickness Without Exposed Support Structur es Electronic Signature(s) Signed: 12/26/2019 12:08:35 PM By: Deon Pilling Entered By: Deon Pilling on 12/26/2019  10:42:06 -------------------------------------------------------------------------------- Vitals Details Patient Name: Date of Service: [name], [name] 12/26/2019 10:30 A M Medical Record Number: [id number] Patient Account Number: [id number] Date of Birth/Sex: Treating RN: March 13, 1935 (84 y.o. [name] Primary Care Sharada Albornoz: Adelfa Koh Other Clinician: Referring Maurice Fotheringham: Treating Georjean Toya/Extender: Berniece Pap Weeks in Treatment: 5 Vital Signs Time Taken: 10:35 Temperature (F): 98.7 Height (in): 75 Pulse (bpm): 56 Weight (lbs): 270 Respiratory Rate (breaths/min): 18 Body Mass Index (BMI): 33.7 Blood Pressure (mmHg): 135/75 Reference Range: 80 - 120 mg / dl Electronic Signature(s) Signed: 12/26/2019 12:08:35 PM By: Deon Pilling Entered By: Deon Pilling on 12/26/2019 10:41:40

## 2020-01-23 ENCOUNTER — Other Ambulatory Visit: Payer: Self-pay

## 2020-01-23 ENCOUNTER — Ambulatory Visit
Admission: RE | Admit: 2020-01-23 | Discharge: 2020-01-23 | Disposition: A | Discharge status: Home / Self Care | Payer: Medicare Other | Source: Ambulatory Visit | Attending: Pulmonary Disease | Admitting: Pulmonary Disease

## 2020-01-23 DIAGNOSIS — R911 Solitary pulmonary nodule: Secondary | ICD-10-CM

## 2020-01-26 ENCOUNTER — Telehealth: Payer: Self-pay | Admitting: Pulmonary Disease

## 2020-01-26 DIAGNOSIS — J449 Chronic obstructive pulmonary disease, unspecified: Secondary | ICD-10-CM

## 2020-01-26 MED ORDER — TRELEGY ELLIPTA 200-62.5-25 MCG/INH IN AEPB: 1.0000 | INHALATION_SPRAY | Freq: Every day | RESPIRATORY_TRACT | 0 refills | Status: DC

## 2020-01-26 NOTE — Telephone Encounter (Signed)
Spoke with the pt  He states that he ordered his trelegy 200 mg through mail order and it is not due to arrive until next wk  He requested 1 sample so he does not run out  I have placed this up front  Nothing further needed

## 2020-01-29 ENCOUNTER — Ambulatory Visit (INDEPENDENT_AMBULATORY_CARE_PROVIDER_SITE_OTHER): Payer: Medicare Other | Admitting: Pulmonary Disease

## 2020-01-29 ENCOUNTER — Encounter: Payer: Self-pay | Admitting: Pulmonary Disease

## 2020-01-29 ENCOUNTER — Other Ambulatory Visit: Payer: Self-pay

## 2020-01-29 VITALS — BP 132/66 | HR 52 | Temp 97.2°F | Ht 75.0 in | Wt 268.2 lb

## 2020-01-29 DIAGNOSIS — R911 Solitary pulmonary nodule: Secondary | ICD-10-CM | POA: Diagnosis not present

## 2020-01-29 DIAGNOSIS — J449 Chronic obstructive pulmonary disease, unspecified: Secondary | ICD-10-CM

## 2020-01-29 DIAGNOSIS — I2510 Atherosclerotic heart disease of native coronary artery without angina pectoris: Secondary | ICD-10-CM

## 2020-01-29 NOTE — Progress Notes (Signed)
[name]    [id number]    Jan 28, 1936  Primary Care Physician:Weeks, Georgeanna Lea, NP  Referring Physician: Renne Crigler, [place] [address]  Chief complaint: Follow up for lung nodule  HPI: 84 year old with significant smoking history, asbestos exposure, coronary artery disease, colon cancer, peripheral vascular disease  Hospitalized in October 2020 for lower extremity cellulitis.  CT scan at that time showed evidence of lower extremity nodular consolidation and a right upper lobe groundglass nodule suggestive of pneumonia.   He was treated with antibiotics for both conditions and referred to pulmonary for further evaluation.    Continues on [place] which was started during this admission.  He has weaned himself off supplemental oxygen Has chronic cough with white mucus, denies any fevers, chills  Pets: 3 dogs, cat.  No birds, farm animals Occupation: Retired Insurance account manager Exposures: Significant exposure to asbestos.  No mold, hot tub, Jacuzzi or down pillows or comforters Smoking history: 120-pack-year smoker.  Quit in 2005 Travel history: Originally lived in [place] and [place].  No significant recent travel Relevant family history: He was adopted and does not know of any family history  Interim history: Follow-up imaging of lung nodule which had grown slightly on CT scan Not PET avid but morphology suggestive of low-grade adenocarcinoma.  Underwent navigational biopsy which showed benign lung tissue He was also seen by dermatology for left axillary lesion with activity on PET scan.  This was felt to be benign.  He underwent a scalp biopsy for a skin lesion which did not show any malignancy.  Outpatient Encounter Medications as of 01/29/2020  Medication Sig  . albuterol (VENTOLIN HFA) 108 (90 Base) MCG/ACT inhaler Inhale 2 puffs into the lungs every 6 (six) hours as needed for wheezing or shortness of breath.  Marland Kitchen amLODipine-benazepril  (LOTREL) 10-40 MG capsule Take 1 capsule by mouth daily.  Marland Kitchen aspirin 81 MG chewable tablet Chew 81 mg by mouth every evening.  Marland Kitchen atorvastatin (LIPITOR) 20 MG tablet Take 1 tablet (20 mg total) by mouth every evening.  . Cholecalciferol (VITAMIN D3) 50 MCG (2000 UT) TABS Take 2,000 Units by mouth 2 (two) times daily.   . Emollient (CERAVE) CREA Apply 1 application topically 2 (two) times daily.  . ferrous sulfate 325 (65 FE) MG tablet Take 325 mg by mouth daily with breakfast.  . Fluticasone-Umeclidin-Vilant (TRELEGY ELLIPTA) 200-62.5-25 MCG/INH AEPB Inhale 1 puff into the lungs daily. Please remember to wash mouth out afterwards!  . hydrochlorothiazide (HYDRODIURIL) 25 MG tablet Take 25 mg by mouth daily.  . Magnesium 250 MG TABS Take 250 mg by mouth daily.   . metoprolol tartrate (LOPRESSOR) 100 MG tablet Take 1 tablet (100 mg total) by mouth at bedtime.  . Multiple Vitamins-Minerals (MENS MULTIPLE VITAMIN/LYCOPENE PO) Take 1 tablet by mouth daily.  . naproxen sodium (ALEVE) 220 MG tablet Take 220 mg by mouth daily as needed (pain).   . nitroGLYCERIN (NITROSTAT) 0.4 MG SL tablet Place 1 tablet (0.4 mg total) under the tongue every 5 (five) minutes as needed.  Marland Kitchen OVER THE COUNTER MEDICATION Take 2 tablets by mouth daily. Fish, flax and borage oil  . [DISCONTINUED] Fluticasone-Umeclidin-Vilant (TRELEGY ELLIPTA) 200-62.5-25 MCG/INH AEPB Inhale 1 puff into the lungs daily.  . Cyanocobalamin (VITAMIN B12 PO) Use as directed 1 tablet in the mouth or throat daily.  . furosemide (LASIX) 40 MG tablet Take 1 tablet (40 mg total) by mouth as directed.  Please take (1) tablet in morning for two days  Hold other tablets for as needed use  . zinc gluconate 50 MG tablet Take 50 mg by mouth daily.   No facility-administered encounter medications on file as of 01/29/2020.   Physical Exam: Blood pressure (!) 120/50, pulse (!) 58, temperature 98.4 F (36.9 C), temperature source Temporal, height 6\' 3"  (1.905 m),  weight 267 lb (121.1 kg), SpO2 90 %. Gen:      No acute distress HEENT:  EOMI, sclera anicteric Neck:     No masses; no thyromegaly Lungs:    Clear to auscultation bilaterally; normal respiratory effort CV:         Regular rate and rhythm; no murmurs Abd:      + bowel sounds; soft, non-tender; no palpable masses, no distension Ext:    No edema; adequate peripheral perfusion Skin:      Warm and dry; no rash Neuro: alert and oriented x 3 Psych: normal mood and affect  Data Reviewed: Imaging: CT abdomen pelvis 04/09/2017-visualized lung bases do not show any abnormality  CT chest 11/19/2018-no PE, patchy airspace disease in the right lower lobe.  1.6 cm right upper lobe subsolid nodule, coronary, aortic atherosclerosis  CT chest 07/21/2019-subsolid 2.3 cm right upper lobe nodule minimally increased in size, dilated pulmonary artery  PET scan 08/08/2019-low-level uptake in the right upper lobe pulmonary nodule, skin thickening in the left axilla with mild uptake, mild aneurysmal dilatation of abdominal aorta.  CT chest 01/23/2020-stable right upper lobe nodule.  New 7.5 mm right upper lobe pulmonary nodule.  Stable emphysema. I have reviewed the images personally.  PFTs: 03/23/2019 FVC 3.09 [63%], FEV1 1.96 [56%], F/F 63, TLC 6.32 [78%], DLCO 11.68 [6 6%] Moderate obstruction with minimal restriction and diffusion defect  Labs: CBC 11/14/2018-WBC 11.7, eos 7%, absolute eosinophil count 819  Pathology: Navigational lung biopsy 08/15/2019-negative Path and cytology Skin biopsy 09/14/2019-dermal scar  Assessment:  Lung nodule Negative pathology on navigational biopsy Results discussed in detail with patient  Last CT scan shows stable right upper lobe nodule of interest with new 7 mm nodule Follow-up CT ordered in 6 months.  COPD Continue Trelegy.  Continue triple therapy with ICS as he has elevated peripheral eosinophils.  Asbestos exposure No evidence of ILD on CT.  Continue  monitoring.  Abnormal PET uptake in left axilla No malignancy on dermatology evaluation.  Health maintenance Pneumovax 05/25/2017 Up-to-date with COVID-19 booster and flu vaccine  Plan/Recommendations: - CT chest without contrast in 6 months.  Marshell Garfinkel MD [facility] 01/29/2020, 11:41 AM  CC: Renne Crigler, NP

## 2020-01-29 NOTE — Patient Instructions (Signed)
Will order of follow-up CT chest without contrast in 6 months for lung nodules Continue the Trelegy inhaler  Follow-up in clinic in 6 months after CT scan.

## 2020-04-09 NOTE — Progress Notes (Signed)
Cardiology Office Note   Date:  04/10/2020   ID:  TABITHA TUPPER, DOB [birth year], MRN [id number]  PCP:  Renne Crigler, NP  Cardiologist:   No primary care provider on file.   Chief Complaint  Patient presents with  . Shortness of Breath      History of Present Illness: [name] is a 85 y.o. male who presents for follow up of CAD.  He was in the hospital in November.  He had sudden leg weakness.  He had an extensive work-up but there was no clear neurologic cause.  He did have nonhealing leg ulcers.  He was thought to have a COPD flare.  He ended up going to rehab afterwards.  He has been seen in the wound clinic.  He actually had angiography of his lower extremities and demonstrated no flow-limiting stenosis.  An echocardiogram demonstrated his EF to be 50 to 55% with no significant abnormalities including no significant pulmonary hypertension.    He has done well since I saw him last year he has no nonhealing wounds for the first time in a long time. hE gets around slowly with a walker at home.  He denies any new symptoms such as chest pressure, neck or arm discomfort.  He has had no new palpitations, presyncope or syncope.  He has had no PND or orthopnea.  He uses O2 at night.  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  He thinks his breathing is the same or perhaps very slightly better.   Past Medical History:  Diagnosis Date  . Arthritis    knees, hips  . CAD (coronary artery disease)    Abnormal stress perfusion study with apical and inferior wall ischemia. LAD had a long 99% stenosis followed by 30% stenosis, first diagonal 25% stenosis, circumflex luminal irregularities, second obtuse marginal 25% stenosis, RCA 40% followed by 40% stenosis. EF 60%. Stenting with 2 bare metal stents by Dr. Burt Knack to the LAD.  Marland Kitchen Colon cancer [facility])    Radiation, chemo,  surgery 2011  . Complication of anesthesia    Pt states he coughs alot during anesthesia  . COPD (chronic obstructive pulmonary disease) ([place])   . Dyspnea    when walking, knee pain   . Dysrhythmia   . History of kidney stones    passed x1  . History of tobacco use   . HOH (hard of hearing)   . Hypertension   . Pneumonia    many times as a child    Past Surgical History:  Procedure Laterality Date  . ABDOMINAL AORTOGRAM W/LOWER EXTREMITY N/A 01/09/2019   Procedure: ABDOMINAL AORTOGRAM W/LOWER EXTREMITY;  Surgeon: Waynetta Sandy, MD;  Location: [place] [facility];  Service: Cardiovascular;  Laterality: N/A;  . BRONCHIAL BIOPSY  08/15/2019   Procedure: BRONCHIAL BIOPSIES;  Surgeon: Garner Nash, DO;  Location: [place] [facility];  Service: Pulmonary;;  . BRONCHIAL BRUSHINGS  08/15/2019   Procedure: BRONCHIAL BRUSHINGS;  Surgeon: Garner Nash, DO;  Location: [place];  Service: Pulmonary;;  . BRONCHIAL NEEDLE ASPIRATION BIOPSY  08/15/2019   Procedure: BRONCHIAL NEEDLE ASPIRATION BIOPSIES;  Surgeon: Garner Nash, DO;  Location: [place];  Service: Pulmonary;;  . BRONCHIAL WASHINGS  08/15/2019   Procedure: BRONCHIAL WASHINGS;  Surgeon: Garner Nash, DO;  Location: [place];  Service: Pulmonary;;  . CATARACT EXTRACTION W/PHACO Left  05/29/2016   Procedure: CATARACT EXTRACTION PHACO AND INTRAOCULAR LENS PLACEMENT (IOC);  Surgeon: Baruch Goldmann, MD;  Location: [facility];  Service: Ophthalmology;  Laterality: Left;  CDE: 10.39  . CATARACT EXTRACTION W/PHACO Right 06/12/2016   Procedure: CATARACT EXTRACTION PHACO AND INTRAOCULAR LENS PLACEMENT RIGHT EYE CDE= 13.59;  Surgeon: Baruch Goldmann, MD;  Location: [facility];  Service: Ophthalmology;  Laterality: Right;  right  . CORONARY ANGIOPLASTY WITH STENT PLACEMENT  2008   x 2  . HEMICOLECTOMY  01/10/2010  . INGUINAL HERNIA REPAIR  2012  . PORTACATH PLACEMENT  2011  . portacath removal  2011  . UMBILICAL HERNIA REPAIR  01/2008    left  . VIDEO BRONCHOSCOPY WITH ENDOBRONCHIAL NAVIGATION N/A 08/15/2019   Procedure: VIDEO BRONCHOSCOPY WITH ENDOBRONCHIAL NAVIGATION;  Surgeon: Garner Nash, DO;  Location: [place];  Service: Pulmonary;  Laterality: N/A;     Current Outpatient Medications  Medication Sig Dispense Refill  . acetaminophen (TYLENOL) 500 MG tablet Take 1,000 mg by mouth every 6 (six) hours as needed.    Marland Kitchen albuterol (VENTOLIN HFA) 108 (90 Base) MCG/ACT inhaler Inhale 2 puffs into the lungs every 6 (six) hours as needed for wheezing or shortness of breath. 6.7 g 0  . amLODipine-benazepril (LOTREL) 10-40 MG capsule Take 1 capsule by mouth daily.    Marland Kitchen aspirin 81 MG chewable tablet Chew 81 mg by mouth every evening.    Marland Kitchen atorvastatin (LIPITOR) 20 MG tablet Take 1 tablet (20 mg total) by mouth every evening.    . Cholecalciferol (VITAMIN D3) 50 MCG (2000 UT) TABS Take 2,000 Units by mouth 2 (two) times daily.     . Cyanocobalamin (VITAMIN B12 PO) Use as directed 1 tablet in the mouth or throat daily.    . Emollient (CERAVE) CREA Apply 1 application topically 2 (two) times daily.    . ferrous sulfate 325 (65 FE) MG tablet Take 325 mg by mouth daily with breakfast.    . Fluticasone-Umeclidin-Vilant (TRELEGY ELLIPTA) 200-62.5-25 MCG/INH AEPB Inhale 1 puff into the lungs daily. Please remember to wash mouth out afterwards! 180 each 3  . hydrochlorothiazide (HYDRODIURIL) 25 MG tablet Take 25 mg by mouth daily.    . Magnesium 250 MG TABS Take 250 mg by mouth daily.     . metoprolol tartrate (LOPRESSOR) 100 MG tablet Take 1 tablet (100 mg total) by mouth at bedtime.    . Multiple Vitamins-Minerals (MENS MULTIPLE VITAMIN/LYCOPENE PO) Take 1 tablet by mouth daily.    . nitroGLYCERIN (NITROSTAT) 0.4 MG SL tablet Place 1 tablet (0.4 mg total) under the tongue every 5 (five) minutes as needed. 25 tablet 3  . zinc gluconate 50 MG tablet Take 50 mg by mouth daily.    Marland Kitchen OVER THE COUNTER MEDICATION Take 2 tablets by mouth  daily. Fish, flax and borage oil (Patient not taking: Reported on 04/10/2020)     No current facility-administered medications for this visit.    Allergies:   Other and Sulfa antibiotics    ROS:  Please see the history of present illness.   Otherwise, review of systems are positive for NONE.   All other systems are reviewed and negative.    PHYSICAL EXAM: VS:  BP 110/68   Pulse 72   Ht 6\' 3"  (1.905 m)   Wt 265 lb (120.2 kg)   BMI 33.12 kg/m  , BMI Body mass index is 33.12 kg/m. GEN:  No distress NECK:  No jugular venous distention at 90 degrees, waveform  within normal limits, carotid upstroke brisk and symmetric, no bruits, no thyromegaly LYMPHATICS:  No cervical adenopathy LUNGS:  Clear to auscultation bilaterally BACK:  No CVA tenderness CHEST:  Unremarkable HEART:  S1 and S2 within normal limits, no S3, no S4, no clicks, no rubs, no murmurs ABD:  Positive bowel sounds normal in frequency in pitch, no bruits, no rebound, no guarding, unable to assess midline mass or bruit with the patient seated. EXT:  2 plus pulses throughout, moderate edema, no cyanosis no clubbing SKIN:  No rashes no nodules NEURO:  Cranial nerves II through XII grossly intact, motor grossly intact throughout PSYCH:  Cognitively intact, oriented to person place and time   EKG:  EKG is not ordered today. The ekg ordered 06/21/2019 demonstrates sinus bradycardia, rate 56, right bundle branch block, left anterior fascicular block   Recent Labs: 08/15/2019: ALT 17; BUN 20; Creatinine, Ser 0.80; Hemoglobin 13.1; Platelets 295; Potassium 3.8; Sodium 138    Lipid Panel    Component Value Date/Time   CHOL 113 06/09/2012 0946   TRIG 104 06/09/2012 0946   HDL 44 06/09/2012 0946   CHOLHDL 5.3 11/24/2007 0438   VLDL 15 11/24/2007 0438   LDLCALC 48 06/09/2012 0946      Wt Readings from Last 3 Encounters:  04/10/20 265 lb (120.2 kg)  01/29/20 268 lb 3.2 oz (121.7 kg)  10/09/19 267 lb (121.1 kg)       Other studies Reviewed: Additional studies/ records that were reviewed today include: Labs, pulmonary clinic notes, vascular surgery notes Review of the above records demonstrates:  Please see elsewhere in the note.     ASSESSMENT AND PLAN:  CAD (coronary artery disease) - He has had no anginal symptoms.  He had stress testing back in 2015.  No change in therapy.  DYSLIPIDEMIA -  Lipids were previously okay but he is about to get review labs and I be happy to see these.   HYPERTENSION, UNSPECIFIED -  The blood pressure is well controlled.  No change in therapy.   OBESITY - We talked quite a while about weight loss with diet.  He likes second helpings.   ABNORMAL EKG - He has had no syncope.  No change in therapy.  AAA - This was 3.1 cm two years ago.  I will check this again next year.  He was previously seen by vascular surgery but is no longer followed by them.  I reviewed the most recent notes.  EDEMA -   This is improved much.  The previous.  No change in therapy.   Current medicines are reviewed at length with the patient today.  The patient does not have concerns regarding medicines.  The following changes have been made: None  Labs/ tests ordered today include: None  Orders Placed This Encounter  Procedures  . VAS [place] AAA DUPLEX     Disposition:   FU with me in 12 months.   Signed, Minus Breeding, MD  04/10/2020 3:50 PM    [facility]

## 2020-04-10 ENCOUNTER — Other Ambulatory Visit: Payer: Self-pay

## 2020-04-10 ENCOUNTER — Ambulatory Visit (INDEPENDENT_AMBULATORY_CARE_PROVIDER_SITE_OTHER): Payer: Medicare Other | Admitting: Cardiology

## 2020-04-10 ENCOUNTER — Encounter: Payer: Self-pay | Admitting: Cardiology

## 2020-04-10 VITALS — BP 110/68 | HR 72 | Ht 75.0 in | Wt 265.0 lb

## 2020-04-10 DIAGNOSIS — I714 Abdominal aortic aneurysm, without rupture, unspecified: Secondary | ICD-10-CM

## 2020-04-10 DIAGNOSIS — I10 Essential (primary) hypertension: Secondary | ICD-10-CM

## 2020-04-10 DIAGNOSIS — R9431 Abnormal electrocardiogram [ECG] [EKG]: Secondary | ICD-10-CM

## 2020-04-10 DIAGNOSIS — I2510 Atherosclerotic heart disease of native coronary artery without angina pectoris: Secondary | ICD-10-CM | POA: Diagnosis not present

## 2020-04-10 NOTE — Patient Instructions (Signed)
Medication Instructions:  The current medical regimen is effective;  continue present plan and medications.  *If you need a refill on your cardiac medications before your next appointment, please call your pharmacy*  Testing/Procedures: Your physician has requested that you have an abdominal aorta duplex on February 2023. During this test, an ultrasound is used to evaluate the aorta. Allow 30 minutes for this exam. Do not eat after midnight the day before and avoid carbonated beverages You will be contacted to be scheduled for this testing when it is closer to time.  Follow-Up: At [facility], you and your health needs are our priority.  As part of our continuing mission to provide you with exceptional heart care, we have created designated Provider Care Teams.  These Care Teams include your primary Cardiologist (physician) and Advanced Practice Providers (APPs -  Physician Assistants and Nurse Practitioners) who all work together to provide you with the care you need, when you need it.  We recommend signing up for the patient portal called "MyChart".  Sign up information is provided on this After Visit Summary.  MyChart is used to connect with patients for Virtual Visits (Telemedicine).  Patients are able to view lab/test results, encounter notes, upcoming appointments, etc.  Non-urgent messages can be sent to your provider as well.   To learn more about what you can do with MyChart, go to [email or web address].    Your next appointment:   12 month(s)  The format for your next appointment:   In Person  Provider:   Minus Breeding, MD   Thank you for choosing [facility]!!

## 2020-07-11 ENCOUNTER — Telehealth: Payer: Self-pay | Admitting: Pulmonary Disease

## 2020-07-11 ENCOUNTER — Ambulatory Visit
Admission: RE | Admit: 2020-07-11 | Discharge: 2020-07-11 | Disposition: A | Discharge status: Home / Self Care | Payer: Medicare Other | Source: Ambulatory Visit | Attending: Pulmonary Disease | Admitting: Pulmonary Disease

## 2020-07-11 ENCOUNTER — Other Ambulatory Visit: Payer: Self-pay

## 2020-07-11 DIAGNOSIS — R911 Solitary pulmonary nodule: Secondary | ICD-10-CM

## 2020-07-11 DIAGNOSIS — R918 Other nonspecific abnormal finding of lung field: Secondary | ICD-10-CM

## 2020-07-11 NOTE — Telephone Encounter (Signed)
Noted by triage.  

## 2020-07-11 NOTE — Telephone Encounter (Signed)
I called and spoke with Linus Orn at [facility].  They did the call report for the CT scan that was done today.  Will forward to PM to make him aware.

## 2020-07-11 NOTE — Telephone Encounter (Signed)
I called and discussed with patient and his wife.  CT shows increase in size of right upper lobe groundglass nodule with solid component  Please order PET scan.  He already had a navigational biopsy of that area by Dr. Valeta Harms last year which did not show any malignancy but I suspect he has an indolent slow-growing malignancy, likely adenocarcinoma I will also ask his case to be discussed at the multidisciplinary tumor board  Marshell Garfinkel MD [facility] 07/11/2020, 2:28 PM

## 2020-07-12 NOTE — Telephone Encounter (Signed)
PET order placed.

## 2020-07-12 NOTE — Addendum Note (Signed)
Addended by: Elton Sin on: 07/12/2020 02:56 PM   Modules accepted: Orders

## 2020-07-16 NOTE — Telephone Encounter (Signed)
PET is scheduled [place] 07/29/20 at 0900.

## 2020-07-18 ENCOUNTER — Encounter: Payer: Self-pay | Admitting: Pulmonary Disease

## 2020-07-18 ENCOUNTER — Other Ambulatory Visit: Payer: Self-pay | Admitting: Acute Care

## 2020-07-18 DIAGNOSIS — R911 Solitary pulmonary nodule: Secondary | ICD-10-CM

## 2020-07-18 NOTE — Telephone Encounter (Signed)
I have placed a recall in the chart to schedule appt with BI after the CT scan has been done in December.

## 2020-07-18 NOTE — Telephone Encounter (Signed)
Sarah, NP stated it was decided PET scan was not necessary at this time and CT without contrast in 6 months (01/2021) was best. I called and spoke with Patient's Wife Lannette Donath (DPR).  Recommendations were given.  Understanding stated.  PET cancelled. Judson Roch, NP ordered CT chest 01/2021.  Nothing further at this time.

## 2020-07-19 NOTE — Progress Notes (Signed)
Case was reviewed at [facility] on 6/9  [place] recommending continued follow up. overall, radiology doesn't think it really changed much. Repeat CT in 6 months   Patient informed and PET scan cancelled. Follow up 6 month CT ordered  Marshell Garfinkel MD [facility] 07/19/2020, 3:30 PM

## 2020-07-26 ENCOUNTER — Other Ambulatory Visit (HOSPITAL_COMMUNITY): Payer: Medicare Other

## 2020-07-29 ENCOUNTER — Other Ambulatory Visit (HOSPITAL_COMMUNITY): Payer: Medicare Other

## 2020-08-05 ENCOUNTER — Other Ambulatory Visit: Payer: Self-pay | Admitting: Pulmonary Disease

## 2020-08-05 DIAGNOSIS — J449 Chronic obstructive pulmonary disease, unspecified: Secondary | ICD-10-CM

## 2020-08-12 IMAGING — CT CT ANGIO CHEST
2 of 7 series · 18 of 46 positions shown · IV contrast (APPLIED)
Comparison: 04/09/2017 and previous

CLINICAL DATA: Dyspnea

EXAM:
CT ANGIOGRAPHY CHEST WITH CONTRAST
TECHNIQUE: Multidetector CT imaging of the chest was performed using the
standard protocol during bolus administration of intravenous
contrast. Multiplanar CT image reconstructions and MIPs were
obtained to evaluate the vascular anatomy.
CONTRAST:  75mL OMNIPAQUE IOHEXOL 350 MG/ML SOLN

[Series 7: thins · axial · 0.79mm/px · z∈[-96,+174]mm · 15 of 436 slices shown]
[im 25/436  lung]
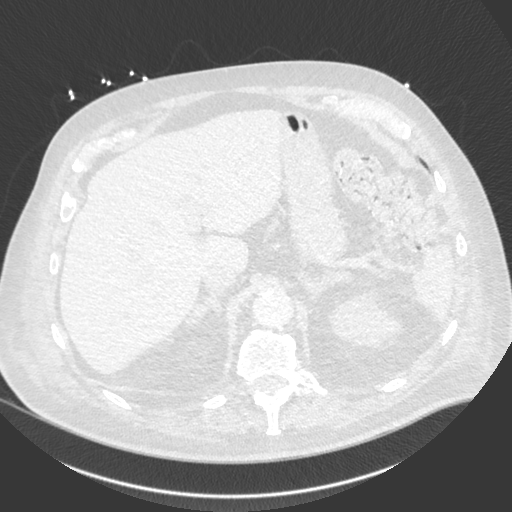
[im 49/436  soft-tissue]
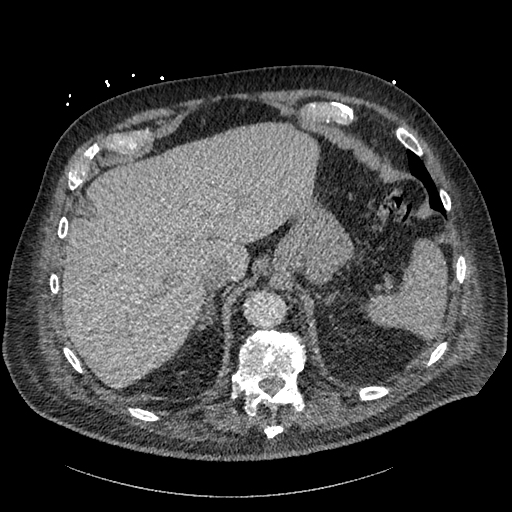
[im 73/436  lung]
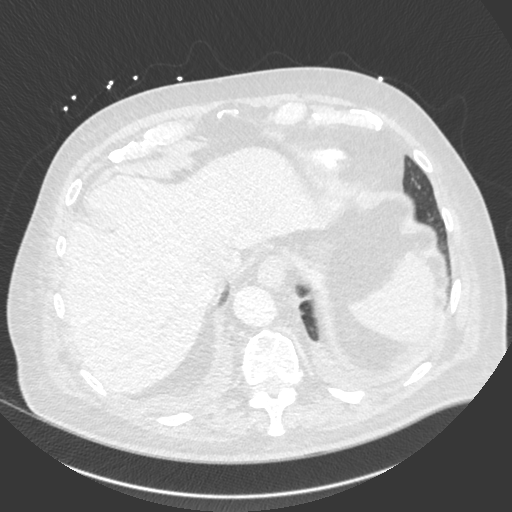
[im 97/436  soft-tissue]
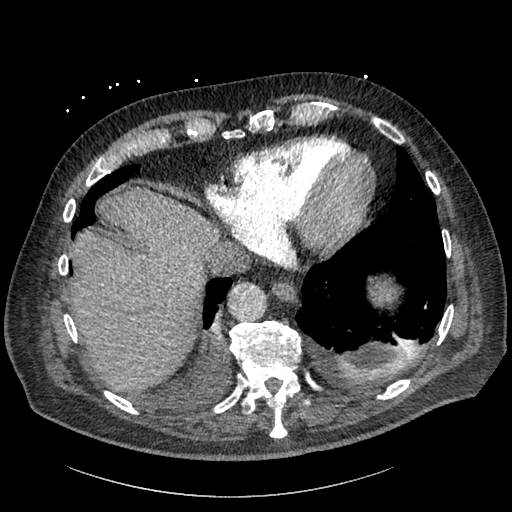
[im 146/436  lung]
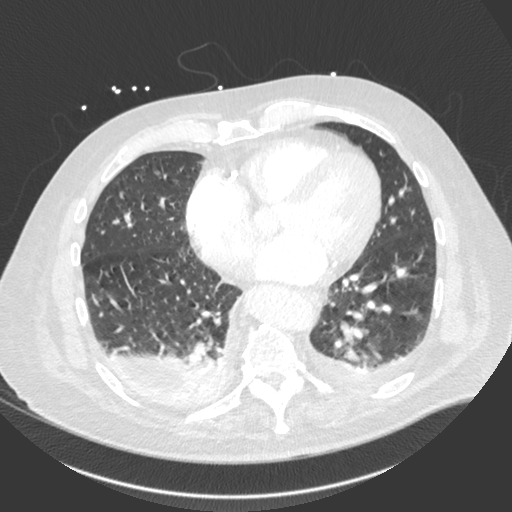
[im 170/436  soft-tissue]
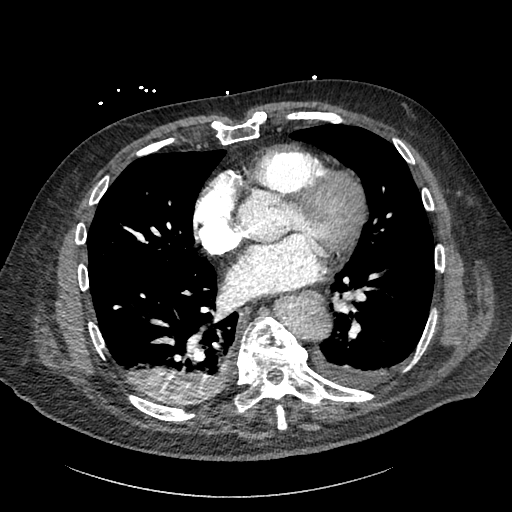
[im 194/436  lung]
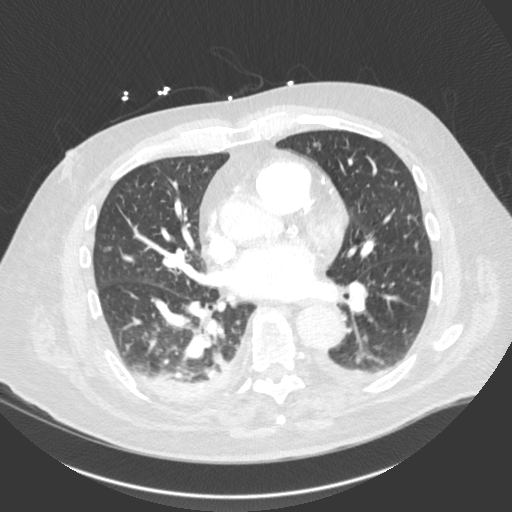
[im 218/436  soft-tissue]
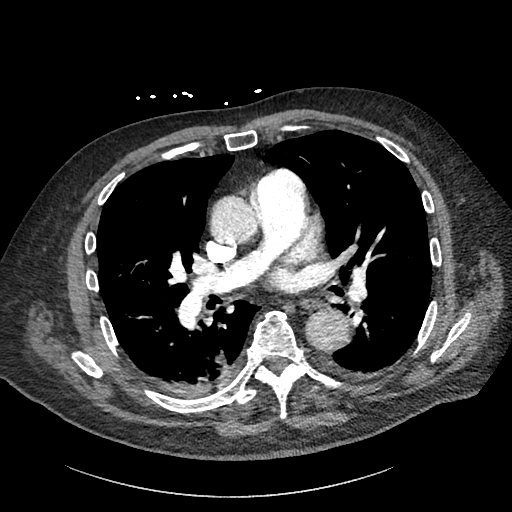
[im 242/436  lung]
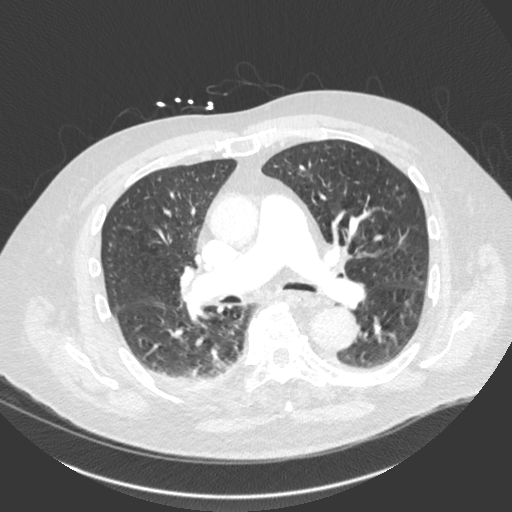
[im 266/436  soft-tissue]
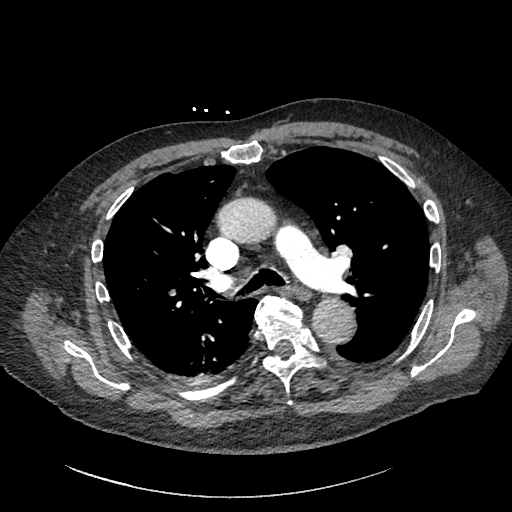
[im 291/436  lung]
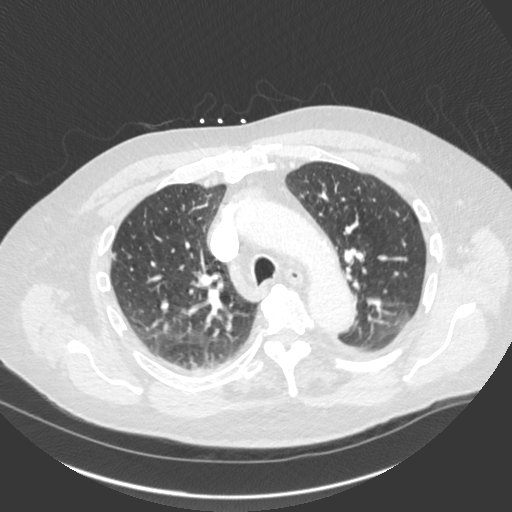
[im 339/436  soft-tissue]
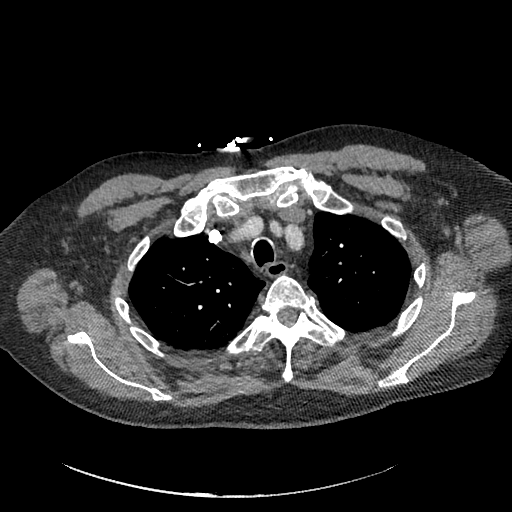
[im 363/436  lung]
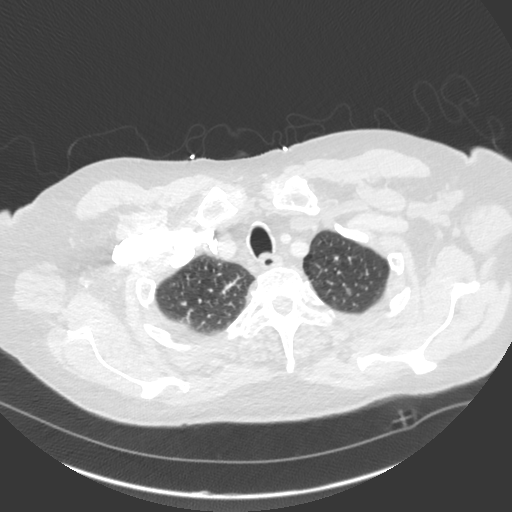
[im 387/436  soft-tissue]
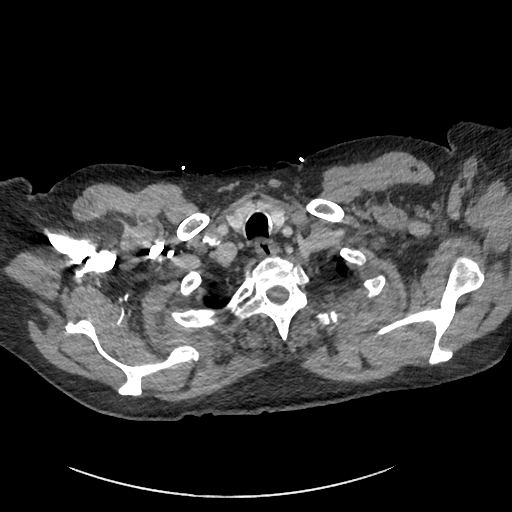
[im 411/436  lung]
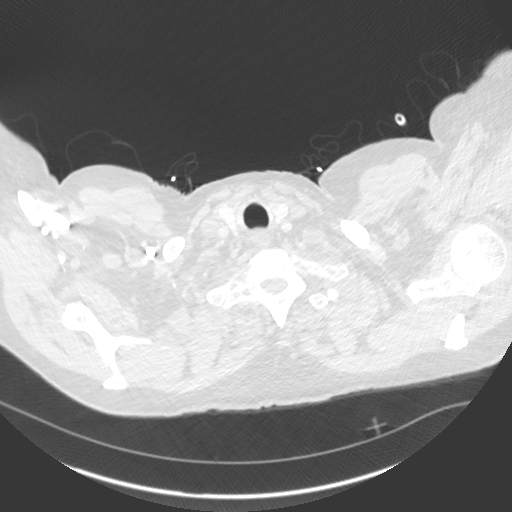

[Series 8: cor · coronal · 0.63mm/px · 3 of 162 slices shown]
[im 41/162  soft-tissue]
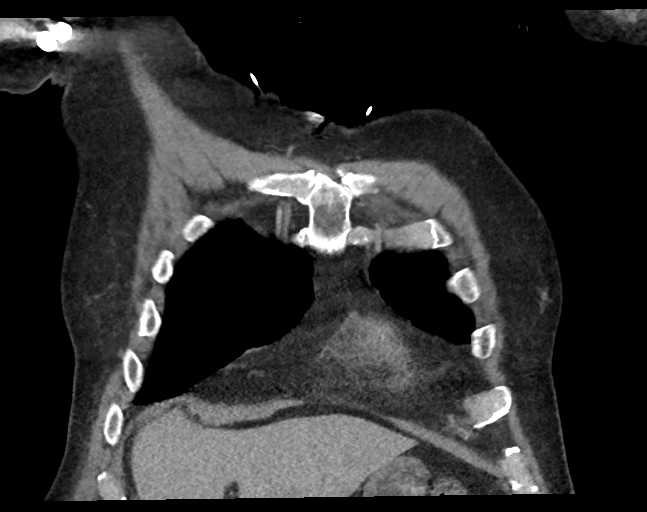
[im 81/162  soft-tissue]
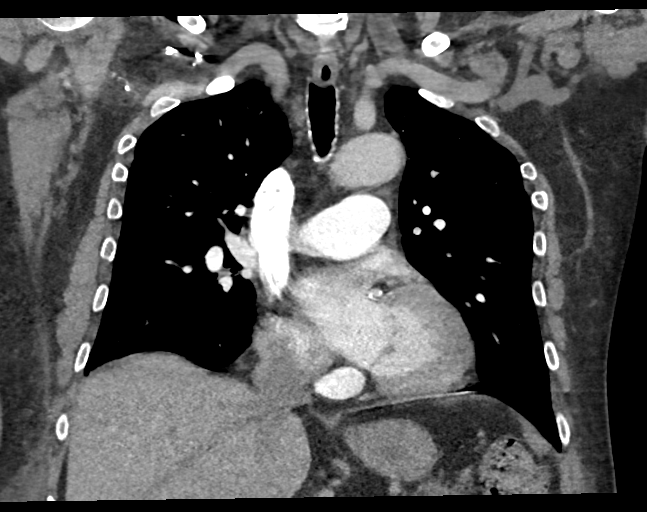
[im 121/162  soft-tissue]
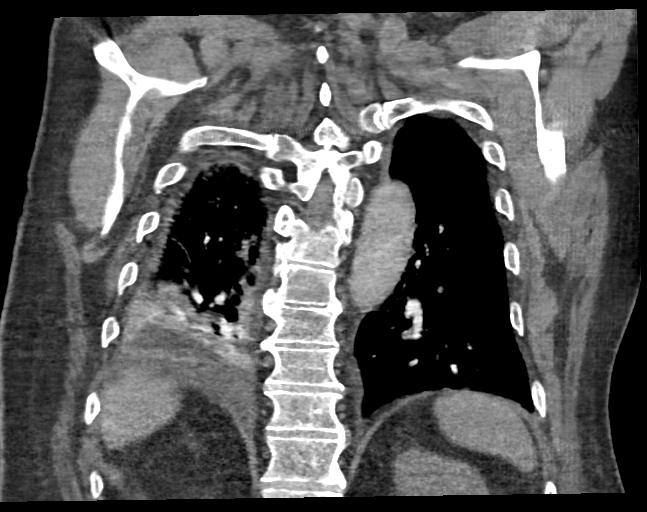

[18 of 46 positions shown; findings below may reference images not displayed]

FINDINGS: Cardiovascular: Heart size normal. No pericardial effusion. Dilated
central pulmonary arteries. Satisfactory opacification of pulmonary
arteries noted, and there is no evidence of pulmonary emboli.
Moderate coronary calcifications. Adequate contrast opacification of
the thoracic aorta with no evidence of dissection, aneurysm, or
stenosis. There is classic 3-vessel brachiocephalic arch anatomy
without proximal stenosis. Scattered atheromatous plaque in the
arch.

Mediastinum/Nodes: No hilar or mediastinal adenopathy.

Lungs/Pleura: Small bilateral pleural effusions. Dependent
atelectasis/consolidation posteriorly in lower lobes, right greater
than left. 1.6 cm sub-solid nodule in the anterior right upper lobe
image 57/6, new since 10/18/2009. Patchy somewhat nodular airspace
opacities laterally in the right lower lobe image 82-83/6, and
posteriorly.

Upper Abdomen: No acute findings.

Musculoskeletal: Anterior vertebral bridging osteophytes at multiple
levels in the mid and lower thoracic spine. No fracture or worrisome
bone lesion.

Review of the MIP images confirms the above findings.
IMPRESSION: 1. Negative for acute PE or thoracic aortic dissection.
2. Patchy airspace disease in the right lower lobe.
3. 1.6 cm right upper lobe subsolid nodule, possibly
infectious/inflammatory. Initial follow-up with CT at 6-12 months is
recommended to confirm resolution. If persistent, repeat CT is
recommended every 2 years until 5 years of stability has been
established. This recommendation follows the consensus statement:
Guidelines for Management of Incidental Pulmonary Nodules Detected
on CT Images:From the [HOSPITAL] 8118; published online
before print (10.1148/radiol.6715141464).
4. Coronary and Aortic Atherosclerosis (377B0-P91.1).

## 2021-01-23 ENCOUNTER — Other Ambulatory Visit: Payer: Medicare Other

## 2021-02-17 ENCOUNTER — Other Ambulatory Visit: Payer: Medicare Other

## 2021-02-17 ENCOUNTER — Other Ambulatory Visit: Payer: Self-pay

## 2021-02-17 ENCOUNTER — Ambulatory Visit
Admission: RE | Admit: 2021-02-17 | Discharge: 2021-02-17 | Disposition: A | Discharge status: Home / Self Care | Payer: Medicare Other | Source: Ambulatory Visit | Attending: Acute Care | Admitting: Acute Care

## 2021-02-17 DIAGNOSIS — R911 Solitary pulmonary nodule: Secondary | ICD-10-CM

## 2021-04-06 NOTE — Progress Notes (Signed)
Cardiology Office Note   Date:  04/09/2021   ID:  [name], DOB 31-May-1935, MRN [id number]  PCP:  Renne Crigler, NP  Cardiologist:   None   Chief Complaint  Patient presents with   Edema      History of Present Illness: [name] is a 86 y.o. male who presents for follow up of CAD.  He was in the hospital in November.  He had sudden leg weakness.  He had an extensive work-up but there was no clear neurologic cause.  He did have nonhealing leg ulcers.  He was thought to have a COPD flare.  He ended up going to rehab afterwards.  He has been seen in the wound clinic.  He actually had angiography of his lower extremities and demonstrated no flow-limiting stenosis.  An echocardiogram demonstrated his EF to be 50 to 55% with no significant abnormalities including no significant pulmonary hypertension.    Since he was last seen he has had no new acute problems.  He gets around in his house slowly and uses a walker.  He has a lift chair.  He has chronic O2 at night.  The patient denies any new symptoms such as chest discomfort, neck or arm discomfort. There has been no new shortness of breath, PND or orthopnea. There have been no reported palpitations, presyncope or syncope.    Past Medical History:  Diagnosis Date   Arthritis    knees, hips   CAD (coronary artery disease)    Abnormal stress perfusion study with apical and inferior wall ischemia. LAD had a long 99% stenosis followed by 30% stenosis, first diagonal 25% stenosis, circumflex luminal irregularities, second obtuse marginal 25% stenosis, RCA 40% followed by 40% stenosis. EF 60%. Stenting with 2 bare metal stents by Dr. Burt Knack to the LAD.   Colon cancer [facility])    Radiation, chemo, surgery 7001   Complication of anesthesia    Pt states he coughs alot during anesthesia   COPD (chronic obstructive pulmonary disease) (HCC)    Dyspnea    when walking, knee pain    Dysrhythmia    History of kidney stones    passed x1    History of tobacco use    HOH (hard of hearing)    Hypertension    Pneumonia    many times as a child    Past Surgical History:  Procedure Laterality Date   ABDOMINAL AORTOGRAM W/LOWER EXTREMITY N/A 01/09/2019   Procedure: ABDOMINAL AORTOGRAM W/LOWER EXTREMITY;  Surgeon: Waynetta Sandy, MD;  Location: [facility];  Service: Cardiovascular;  Laterality: N/A;   BRONCHIAL BIOPSY  08/15/2019   Procedure: BRONCHIAL BIOPSIES;  Surgeon: Garner Nash, DO;  Location: [facility];  Service: Pulmonary;;   BRONCHIAL BRUSHINGS  08/15/2019   Procedure: BRONCHIAL BRUSHINGS;  Surgeon: Garner Nash, DO;  Location: [place];  Service: Pulmonary;;   BRONCHIAL NEEDLE ASPIRATION BIOPSY  08/15/2019   Procedure: BRONCHIAL NEEDLE ASPIRATION BIOPSIES;  Surgeon: Garner Nash, DO;  Location: [place];  Service: Pulmonary;;   BRONCHIAL WASHINGS  08/15/2019   Procedure: BRONCHIAL WASHINGS;  Surgeon: Garner Nash, DO;  Location: [place];  Service: Pulmonary;;   CATARACT EXTRACTION W/PHACO Left 05/29/2016   Procedure: CATARACT EXTRACTION PHACO AND INTRAOCULAR LENS PLACEMENT (IOC);  Surgeon: Baruch Goldmann, MD;  Location: [facility];  Service: Ophthalmology;  Laterality: Left;  CDE: 10.39   CATARACT EXTRACTION W/PHACO Right 06/12/2016   Procedure: CATARACT EXTRACTION PHACO AND  INTRAOCULAR LENS PLACEMENT RIGHT EYE CDE= 13.59;  Surgeon: Baruch Goldmann, MD;  Location: [facility];  Service: Ophthalmology;  Laterality: Right;  right   CORONARY ANGIOPLASTY WITH STENT PLACEMENT  2008   x 2   HEMICOLECTOMY  01/10/2010   INGUINAL HERNIA REPAIR  2012   PORTACATH PLACEMENT  2011   portacath removal  9562   UMBILICAL HERNIA REPAIR  01/2008   left   VIDEO BRONCHOSCOPY WITH ENDOBRONCHIAL NAVIGATION N/A 08/15/2019   Procedure: VIDEO BRONCHOSCOPY WITH ENDOBRONCHIAL NAVIGATION;  Surgeon: Garner Nash, DO;  Location: [place];  Service: Pulmonary;  Laterality: N/A;     Current Outpatient  Medications  Medication Sig Dispense Refill   acetaminophen (TYLENOL) 500 MG tablet Take 1,000 mg by mouth every 6 (six) hours as needed.     albuterol (VENTOLIN HFA) 108 (90 Base) MCG/ACT inhaler Inhale 2 puffs into the lungs every 6 (six) hours as needed for wheezing or shortness of breath. 6.7 g 0   amLODipine-benazepril (LOTREL) 10-40 MG capsule Take 1 capsule by mouth daily.     aspirin 81 MG chewable tablet Chew 81 mg by mouth every evening.     atorvastatin (LIPITOR) 20 MG tablet Take 1 tablet (20 mg total) by mouth every evening.     Cholecalciferol (VITAMIN D3) 50 MCG (2000 UT) TABS Take 2,000 Units by mouth 2 (two) times daily.      Cyanocobalamin (VITAMIN B12 PO) Use as directed 1 tablet in the mouth or throat daily.     Emollient (CERAVE) CREA Apply 1 application topically 2 (two) times daily.     ferrous sulfate 325 (65 FE) MG tablet Take 325 mg by mouth daily with breakfast.     hydrochlorothiazide (HYDRODIURIL) 25 MG tablet Take 25 mg by mouth daily.     Magnesium 250 MG TABS Take 250 mg by mouth daily.      metoprolol tartrate (LOPRESSOR) 100 MG tablet Take 1 tablet (100 mg total) by mouth at bedtime.     Multiple Vitamins-Minerals (MENS MULTIPLE VITAMIN/LYCOPENE PO) Take 1 tablet by mouth daily.     nitroGLYCERIN (NITROSTAT) 0.4 MG SL tablet Place 1 tablet (0.4 mg total) under the tongue every 5 (five) minutes as needed. 25 tablet 3   OVER THE COUNTER MEDICATION Take 2 tablets by mouth daily. Fish, flax and borage oil     TRELEGY ELLIPTA 200-62.5-25 MCG/INH AEPB USE 1 INHALATION DAILY (PLEASE REMEMBER TO WASH MOUTH OUT AFTERWARDS) 360 each 1   Turmeric (QC TUMERIC COMPLEX PO) Take by mouth 2 (two) times daily.     zinc gluconate 50 MG tablet Take 50 mg by mouth daily.     No current facility-administered medications for this visit.    Allergies:   Other and Sulfa antibiotics    ROS:  Please see the history of present illness.   Otherwise, review of systems are positive for  none.   All other systems are reviewed and negative.    PHYSICAL EXAM: VS:  BP 140/66    Pulse (!) 58    Ht 6\' 2"  (1.88 m)    Wt 271 lb (122.9 kg)    BMI 34.79 kg/m  , BMI Body mass index is 34.79 kg/m. GEN:  No distress NECK:  No jugular venous distention at 90 degrees, waveform within normal limits, carotid upstroke brisk and symmetric, no bruits, no thyromegaly LYMPHATICS:  No cervical adenopathy LUNGS:  Clear to auscultation bilaterally BACK:  No CVA tenderness CHEST:  Unremarkable HEART:  S1 and S2 within normal limits, no S3, no S4, no clicks, no rubs, no murmurs ABD:  Positive bowel sounds normal in frequency in pitch, no bruits, no rebound, no guarding, unable to assess midline mass or bruit with the patient seated. EXT:  2 plus pulses throughout, moderate edema, no cyanosis no clubbing SKIN:  No rashes no nodules NEURO:  Cranial nerves II through XII grossly intact, motor grossly intact throughout PSYCH:  Cognitively intact, oriented to person place and time    EKG:  EKG is  ordered today. The ekg ordered today demonstrates sinus bradycardia, rate 58, right bundle branch block, left anterior fascicular block   Recent Labs: No results found for requested labs within last 8760 hours.    Lipid Panel    Component Value Date/Time   CHOL 113 06/09/2012 0946   TRIG 104 06/09/2012 0946   HDL 44 06/09/2012 0946   CHOLHDL 5.3 11/24/2007 0438   VLDL 15 11/24/2007 0438   LDLCALC 48 06/09/2012 0946      Wt Readings from Last 3 Encounters:  04/09/21 271 lb (122.9 kg)  04/10/20 265 lb (120.2 kg)  01/29/20 268 lb 3.2 oz (121.7 kg)      Other studies Reviewed: Additional studies/ records that were reviewed today include: Labs Review of the above records demonstrates:  Please see elsewhere in the note.     ASSESSMENT AND PLAN:  CAD (coronary artery disease) - The patient has no new sypmtoms.  No further cardiovascular testing is indicated.  We will continue with  aggressive risk reduction and meds as listed.  DYSLIPIDEMIA -  LDL was LDL was 41 with an HDL of 43.  No change in therapy.  HYPERTENSION, UNSPECIFIED -  The blood pressure is at target.  No change in therapy.   OBESITY - We have talked about this at length previously.  He understands my diet suggestions.    ABNORMAL EKG - He has had no presyncope or syncope and understands his conduction abnormalities.   AAA - This was 3.1 cm .  He is getting follow-up tomorrow.  EDEMA -   This is much improved.  He is having his legs followed by his spouse.   Current medicines are reviewed at length with the patient today.  The patient does not have concerns regarding medicines.  The following changes have been made: None  Labs/ tests ordered today include:  None  Orders Placed This Encounter  Procedures   EKG 12-Lead     Disposition:   FU with me in 12 months.   Signed, Minus Breeding, MD  04/09/2021 2:21 PM    [facility]

## 2021-04-09 ENCOUNTER — Other Ambulatory Visit: Payer: Self-pay

## 2021-04-09 ENCOUNTER — Ambulatory Visit (INDEPENDENT_AMBULATORY_CARE_PROVIDER_SITE_OTHER): Payer: Medicare Other | Admitting: Cardiology

## 2021-04-09 ENCOUNTER — Encounter: Payer: Self-pay | Admitting: Cardiology

## 2021-04-09 VITALS — BP 140/66 | HR 58 | Ht 74.0 in | Wt 271.0 lb

## 2021-04-09 DIAGNOSIS — I2510 Atherosclerotic heart disease of native coronary artery without angina pectoris: Secondary | ICD-10-CM

## 2021-04-09 DIAGNOSIS — E785 Hyperlipidemia, unspecified: Secondary | ICD-10-CM

## 2021-04-09 DIAGNOSIS — I10 Essential (primary) hypertension: Secondary | ICD-10-CM

## 2021-04-09 NOTE — Patient Instructions (Signed)
Medication Instructions:  The current medical regimen is effective;  continue present plan and medications.  *If you need a refill on your cardiac medications before your next appointment, please call your pharmacy*  Follow-Up: At [facility], you and your health needs are our priority.  As part of our continuing mission to provide you with exceptional heart care, we have created designated Provider Care Teams.  These Care Teams include your primary Cardiologist (physician) and Advanced Practice Providers (APPs -  Physician Assistants and Nurse Practitioners) who all work together to provide you with the care you need, when you need it.  We recommend signing up for the patient portal called "MyChart".  Sign up information is provided on this After Visit Summary.  MyChart is used to connect with patients for Virtual Visits (Telemedicine).  Patients are able to view lab/test results, encounter notes, upcoming appointments, etc.  Non-urgent messages can be sent to your provider as well.   To learn more about what you can do with MyChart, go to [email or web address].    Your next appointment:   1 year(s)  The format for your next appointment:   In Person  Provider:   James Hochrein, MD   Thank you for choosing [facility]!!    

## 2021-04-10 ENCOUNTER — Ambulatory Visit (HOSPITAL_COMMUNITY)
Admission: RE | Admit: 2021-04-10 | Discharge: 2021-04-10 | Disposition: A | Discharge status: Home / Self Care | Payer: Medicare Other | Source: Ambulatory Visit | Attending: Cardiology | Admitting: Cardiology

## 2021-04-10 DIAGNOSIS — I7143 Infrarenal abdominal aortic aneurysm, without rupture: Secondary | ICD-10-CM | POA: Diagnosis not present

## 2021-04-10 DIAGNOSIS — I714 Abdominal aortic aneurysm, without rupture, unspecified: Secondary | ICD-10-CM | POA: Insufficient documentation

## 2021-07-22 ENCOUNTER — Other Ambulatory Visit: Payer: Self-pay | Admitting: Pulmonary Disease

## 2021-07-22 DIAGNOSIS — J449 Chronic obstructive pulmonary disease, unspecified: Secondary | ICD-10-CM

## 2021-08-25 ENCOUNTER — Telehealth: Payer: Self-pay | Admitting: Pulmonary Disease

## 2021-08-25 MED ORDER — TRELEGY ELLIPTA 200-62.5-25 MCG/ACT IN AEPB: 1.0000 | INHALATION_SPRAY | Freq: Every day | RESPIRATORY_TRACT | 1 refills | Status: DC

## 2021-08-25 MED ORDER — TRELEGY ELLIPTA 200-62.5-25 MCG/ACT IN AEPB: 1.0000 | INHALATION_SPRAY | Freq: Every day | RESPIRATORY_TRACT | 0 refills | Status: DC

## 2021-08-25 NOTE — Telephone Encounter (Signed)
Patient called back and scheduled first available 08/29/2021 with Roxan Diesel, NP. Patient states it is an emergency and needs a sample as he only has 1 day left. Please advise- call back number is [phone].

## 2021-08-25 NOTE — Telephone Encounter (Signed)
Called and spoke with patient. He is aware that I will leave a sample of Trelegy for him at the front desk and send in the RX for him. He verbalized understanding.   Nothing further needed at time of call.

## 2021-08-25 NOTE — Telephone Encounter (Signed)
Called patient but call went straight to VM. Patient needs an OV scheduled before any refills or samples will be given. Last seen in 01/2020.

## 2021-08-29 ENCOUNTER — Ambulatory Visit (INDEPENDENT_AMBULATORY_CARE_PROVIDER_SITE_OTHER): Payer: Medicare Other | Admitting: Nurse Practitioner

## 2021-08-29 ENCOUNTER — Encounter: Payer: Self-pay | Admitting: Nurse Practitioner

## 2021-08-29 VITALS — BP 110/60 | HR 60 | Temp 98.2°F | Ht 74.0 in | Wt 274.4 lb

## 2021-08-29 DIAGNOSIS — R911 Solitary pulmonary nodule: Secondary | ICD-10-CM

## 2021-08-29 DIAGNOSIS — J449 Chronic obstructive pulmonary disease, unspecified: Secondary | ICD-10-CM

## 2021-08-29 MED ORDER — ALBUTEROL SULFATE HFA 108 (90 BASE) MCG/ACT IN AERS: 2.0000 | INHALATION_SPRAY | Freq: Four times a day (QID) | RESPIRATORY_TRACT | 2 refills | Status: DC | PRN

## 2021-08-29 MED ORDER — TRELEGY ELLIPTA 200-62.5-25 MCG/ACT IN AEPB: 1.0000 | INHALATION_SPRAY | Freq: Every day | RESPIRATORY_TRACT | 6 refills | Status: DC

## 2021-08-29 NOTE — Assessment & Plan Note (Signed)
Compensated on current regimen with no recent exacerbations.  We will continue him on triple therapy regimen with Trelegy, refill sent today.  New Rx sent for him to have as needed albuterol; although he has not required it in quite some time.  Patient Instructions  Continue Trelegy 1 puff daily. Brush tongue and rinse mouth afterwards  Continue Albuterol inhaler 2 puffs or 3 mL neb every 6 hours as needed for shortness of breath or wheezing. Notify if symptoms persist despite rescue inhaler/neb use.   CT chest ordered today for follow up.  Follow up in 6 months with Dr. Vaughan Browner. If symptoms do not improve or worsen, please contact office for sooner follow up or seek emergency care.

## 2021-08-29 NOTE — Patient Instructions (Addendum)
Continue Trelegy 1 puff daily. Brush tongue and rinse mouth afterwards  Continue Albuterol inhaler 2 puffs or 3 mL neb every 6 hours as needed for shortness of breath or wheezing. Notify if symptoms persist despite rescue inhaler/neb use.   CT chest ordered today for follow up.  Follow up in 6 months with Dr. Vaughan Browner. If symptoms do not improve or worsen, please contact office for sooner follow up or seek emergency care.

## 2021-08-29 NOTE — Progress Notes (Signed)
$'@Patient'C$  ID: [name], male    DOB: 06-19-1935, 86 y.o.   MRN: [id number]  Chief Complaint  Patient presents with   Follow-up    Referring provider: Renne Crigler, NP  HPI: 86 year old male, former smoker followed for COPD mixed type and lung nodules.  He also has a history of significant exposure to asbestos.  Previous lung imaging showed a slow-growing nodule.  PET scan did not show any evidence of hypermetabolism but morphology suggestive of low-grade adenocarcinoma.  He underwent navigational bronchoscopy with biopsy which showed benign lung tissue.  He is a patient of Dr. Matilde Bash and last seen in office 01/29/2020.  Past medical history significant for hypertension, CAD, AAA without rupture, history of DVT, arthritis, history of colon cancer, HLD  TEST/EVENTS:  03/23/2019 PFTs: FVC 60, FEV1 57, ratio 63, TLC 78, DLCOcor 66.  Moderately severe obstructive airway disease.  No BD 08/08/2019 PET scan: Low-level uptake in the right upper lobe pulmonary nodule but less than mediastinal blood pool activity.  Skin thickening in the left axilla with mild FDG uptake.  No signs of hypermetabolic lymph nodes in the chest or abdomen.  Mild aneurysmal dilation of the abdominal aorta.  Incidental findings of spinal degeneration. 08/15/2019 bronchoscopy with cytology negative for malignant cells 07/11/2020: CT chest without contrast: Atherosclerosis.  Tortuosity of the aorta is similar to prior study in the descending segment.  Central pulmonary vasculature normal caliber.  No LAD.  Subsolid nodule in the right upper lobe measuring 1.6 x 1.5 cm with a solid component measuring 11 x 5 mm.  These are within 1 mm of its size on the previous study.  However compared to June 2021, solid component has increased in size.  Small spiculated area that was present on the previous exam is no longer present.  There is branching area of nodularity in the left upper lobe measuring 6 x 5 mm.  Airways are patent aside from  some mild bronchial wall thickening and some secretions adjacent to the left upper lobe nodule.  Subpleural reticulation and some groundglass at the right lung base without change.  Pulmonary emphysema as before. 02/17/2021 CT chest without contrast: Atherosclerosis.  No LAD.  The irregular subsolid pulmonary nodule in the right upper lobe shows no significant change.  Measuring 18 x 14 mm with central solid component measuring 9 x 6 mm.  No new or enlarging pulmonary nodules or masses are identified.  Low-grade adenocarcinoma cannot be ruled out.  Emphysematous changes again noted and unchanged.  Recommend repeat CT in 6 months.  01/29/2020: OV with Dr. Vaughan Browner.  Previous negative pathology and navigational biopsy.  Follow-up CT in 6 months for 7 mm new nodule in the right upper lobe.  Continued on Trelegy for COPD mixed type.  No evidence of ILD on CT related to asbestos exposure.  08/29/2021: Today - follow up Patient presents today with wife for overdue follow-up and medication refill.  He has been feeling well from a pulmonary standpoint since we saw him last.  He has not had any exacerbations requiring prednisone or antibiotics.  He does get winded with certain activities but he is more so limited by weakness in his lower legs and pain, which is his baseline.  He does use a wheelchair to help him get around.  He is on Trelegy, which he uses daily.  He never requires albuterol.  Does not member the last time he used it.  Previous CT scan was overall stable.  He  still has concern for low-grade adenocarcinoma but previous bronchoscopy was with benign lung tissue.  He denies any hemoptysis, weight loss, anorexia, fevers, night sweats, orthopnea, PND.   Allergies  Allergen Reactions   Other     General anesthesia - cough    Sulfa Antibiotics     Unknown reaction - we had listed nausea     Immunization History  Administered Date(s) Administered   DTaP 07/12/2009   Influenza Whole 11/09/2008    Influenza, High Dose Seasonal PF 01/22/2016, 11/12/2016, 12/25/2017, 10/11/2018, 11/07/2019   Influenza,inj,quad, With Preservative 10/11/2018   PFIZER(Purple Top)SARS-COV-2 Vaccination 04/02/2019, 04/26/2019   Pneumococcal Conjugate-13 11/10/2007, 05/25/2016   Pneumococcal Polysaccharide-23 05/25/2017   Tdap 05/13/2009   Zoster, Live 05/13/2009    Past Medical History:  Diagnosis Date   Arthritis    knees, hips   CAD (coronary artery disease)    Abnormal stress perfusion study with apical and inferior wall ischemia. LAD had a long 99% stenosis followed by 30% stenosis, first diagonal 25% stenosis, circumflex luminal irregularities, second obtuse marginal 25% stenosis, RCA 40% followed by 40% stenosis. EF 60%. Stenting with 2 bare metal stents by Dr. Burt Knack to the LAD.   Colon cancer [facility])    Radiation, chemo, surgery 7628   Complication of anesthesia    Pt states he coughs alot during anesthesia   COPD (chronic obstructive pulmonary disease) (HCC)    Dyspnea    when walking, knee pain    Dysrhythmia    History of kidney stones    passed x1   History of tobacco use    HOH (hard of hearing)    Hypertension    Pneumonia    many times as a child    Tobacco History: Social History   Tobacco Use  Smoking Status Former   Packs/day: 3.00   Years: 57.00   Total pack years: 171.00   Types: Cigarettes   Quit date: 2006   Years since quitting: 17.5   Passive exposure: Past  Smokeless Tobacco Never  Tobacco Comments   quit smoking 2006   Counseling given: Not Answered Tobacco comments: quit smoking 2006   Outpatient Medications Prior to Visit  Medication Sig Dispense Refill   acetaminophen (TYLENOL) 500 MG tablet Take 1,000 mg by mouth every 6 (six) hours as needed.     amLODipine-benazepril (LOTREL) 10-40 MG capsule Take 1 capsule by mouth daily.     aspirin 81 MG chewable tablet Chew 81 mg by mouth every evening.     atorvastatin (LIPITOR) 20 MG tablet Take 1 tablet (20  mg total) by mouth every evening.     Cholecalciferol (VITAMIN D3) 50 MCG (2000 UT) TABS Take 2,000 Units by mouth 2 (two) times daily.      Cinnamon 500 MG TABS Take 500 mg by mouth 2 (two) times daily.     Cyanocobalamin (VITAMIN B12 PO) Use as directed 1 tablet in the mouth or throat daily.     Emollient (CERAVE) CREA Apply 1 application topically 2 (two) times daily.     ferrous sulfate 325 (65 FE) MG tablet Take 325 mg by mouth daily with breakfast.     hydrochlorothiazide (HYDRODIURIL) 25 MG tablet Take 25 mg by mouth daily.     Magnesium 250 MG TABS Take 250 mg by mouth daily.      metoprolol tartrate (LOPRESSOR) 100 MG tablet Take 1 tablet (100 mg total) by mouth at bedtime.     Multiple Vitamins-Minerals (MENS MULTIPLE VITAMIN/LYCOPENE PO) Take  1 tablet by mouth daily.     nitroGLYCERIN (NITROSTAT) 0.4 MG SL tablet Place 1 tablet (0.4 mg total) under the tongue every 5 (five) minutes as needed. 25 tablet 3   OVER THE COUNTER MEDICATION Take 2 tablets by mouth daily. Fish, flax and borage oil     Turmeric (QC TUMERIC COMPLEX PO) Take by mouth 2 (two) times daily.     zinc gluconate 50 MG tablet Take 50 mg by mouth daily.     albuterol (VENTOLIN HFA) 108 (90 Base) MCG/ACT inhaler Inhale 2 puffs into the lungs every 6 (six) hours as needed for wheezing or shortness of breath. 6.7 g 0   Fluticasone-Umeclidin-Vilant (TRELEGY ELLIPTA) 200-62.5-25 MCG/ACT AEPB Inhale 1 puff into the lungs daily. 1 each 0   Fluticasone-Umeclidin-Vilant (TRELEGY ELLIPTA) 200-62.5-25 MCG/ACT AEPB Inhale 1 puff into the lungs daily. 180 each 1   No facility-administered medications prior to visit.     Review of Systems:   Constitutional: No weight loss or gain, night sweats, fevers, chills, fatigue, or lassitude. HEENT: No headaches, difficulty swallowing, tooth/dental problems, or sore throat. No sneezing, itching, ear ache, nasal congestion, or post nasal drip CV:  +swelling in lower extremities (unchanged  from baseline; improved over the past few months). No chest pain, orthopnea, PND, anasarca, dizziness, palpitations, syncope Resp: +shortness of breath with exertion (baseline). No excess mucus or change in color of mucus. No productive or non-productive. No hemoptysis. No wheezing.  No chest wall deformity GI:  No heartburn, indigestion, abdominal pain, nausea, vomiting, diarrhea, change in bowel habits, loss of appetite, bloody stools.  Skin: No rash, lesions, ulcerations MSK:  No joint pain or swelling.  No decreased range of motion.  No back pain. Neuro: No dizziness or lightheadedness.  Psych: No depression or anxiety. Mood stable.     Physical Exam:  BP 110/60 (BP Location: Left Arm, Cuff Size: Normal)   Pulse 60   Temp 98.2 F (36.8 C)   Ht '6\' 2"'$  (1.88 m)   Wt 274 lb 6.4 oz (124.5 kg)   SpO2 93%   BMI 35.23 kg/m   GEN: Pleasant, interactive, chronically-ill appearing; obese; in no acute distress. HEENT:  Normocephalic and atraumatic. PERRLA. Sclera white. Nasal turbinates pink, moist and patent bilaterally. No rhinorrhea present. Oropharynx pink and moist, without exudate or edema. No lesions, ulcerations, or postnasal drip.  NECK:  Supple w/ fair ROM. No JVD present. Normal carotid impulses w/o bruits. Thyroid symmetrical with no goiter or nodules palpated. No lymphadenopathy.   CV: RRR, no m/r/g, no peripheral edema. Pulses intact, +2 bilaterally. No cyanosis, pallor or clubbing. PULMONARY:  Unlabored, regular breathing. Diminished bases bilaterally A&P w/o wheezes/rales/rhonchi. No accessory muscle use. No dullness to percussion. GI: BS present and normoactive. Soft, non-tender to palpation. No organomegaly or masses detected. No CVA tenderness. MSK: No erythema, warmth or tenderness. Cap refil <2 sec all extrem. No deformities or joint swelling noted.  Neuro: A/Ox3. No focal deficits noted.   Skin: Warm, no lesions or rashe Psych: Normal affect and behavior. Judgement and  thought content appropriate.     Lab Results:  CBC    Component Value Date/Time   WBC 6.9 08/15/2019 1101   RBC 4.26 08/15/2019 1101   HGB 13.1 08/15/2019 1101   HCT 40.3 08/15/2019 1101   PLT 295 08/15/2019 1101   MCV 94.6 08/15/2019 1101   MCH 30.8 08/15/2019 1101   MCHC 32.5 08/15/2019 1101   RDW 13.2 08/15/2019 1101   LYMPHSABS  0.5 (L) 11/20/2018 0753   MONOABS 0.5 11/20/2018 0753   EOSABS 0.0 11/20/2018 0753   BASOSABS 0.0 11/20/2018 0753    BMET    Component Value Date/Time   NA 138 08/15/2019 1101   K 3.8 08/15/2019 1101   CL 101 08/15/2019 1101   CO2 27 08/15/2019 1101   GLUCOSE 99 08/15/2019 1101   BUN 20 08/15/2019 1101   CREATININE 0.80 08/15/2019 1101   CREATININE 0.69 06/09/2012 0946   CALCIUM 9.1 08/15/2019 1101   GFRNONAA >60 08/15/2019 1101   GFRNONAA >89 06/09/2012 0946   GFRAA >60 08/15/2019 1101   GFRAA >89 06/09/2012 0946    BNP No results found for: "BNP"   Imaging:  No results found.       Latest Ref Rng & Units 03/23/2019    9:06 AM  PFT Results  FVC-Pre L 2.92   FVC-Predicted Pre % 60   FVC-Post L 3.09   FVC-Predicted Post % 63   Pre FEV1/FVC % % 69   Post FEV1/FCV % % 63   FEV1-Pre L 2.01   FEV1-Predicted Pre % 57   FEV1-Post L 1.96   DLCO uncorrected ml/min/mmHg 18.68   DLCO UNC% % 66   DLVA Predicted % 92   TLC L 6.32   TLC % Predicted % 78   RV % Predicted % 113     No results found for: "NITRICOXIDE"      Assessment & Plan:   COPD mixed type (HCC) Compensated on current regimen with no recent exacerbations.  We will continue him on triple therapy regimen with Trelegy, refill sent today.  New Rx sent for him to have as needed albuterol; although he has not required it in quite some time.  Patient Instructions  Continue Trelegy 1 puff daily. Brush tongue and rinse mouth afterwards  Continue Albuterol inhaler 2 puffs or 3 mL neb every 6 hours as needed for shortness of breath or wheezing. Notify if symptoms  persist despite rescue inhaler/neb use.   CT chest ordered today for follow up.  Follow up in 6 months with Dr. Vaughan Browner. If symptoms do not improve or worsen, please contact office for sooner follow up or seek emergency care.     Lung nodule Slow-growing right upper lobe nodule, previous CT scan was relatively stable when compared to prior.  Bronchoscopy from 2021 was negative for malignant cells.  We will continue to monitor with repeat CT this month, for 17-monthfollow-up.   I spent 28 minutes of dedicated to the care of this patient on the date of this encounter to include pre-visit review of records, face-to-face time with the patient discussing conditions above, post visit ordering of testing, clinical documentation with the electronic health record, making appropriate referrals as documented, and communicating necessary findings to members of the patients care team.  KClayton Bibles NP 08/29/2021  Pt aware and understands NP's role.

## 2021-08-29 NOTE — Assessment & Plan Note (Signed)
Slow-growing right upper lobe nodule, previous CT scan was relatively stable when compared to prior.  Bronchoscopy from 2021 was negative for malignant cells.  We will continue to monitor with repeat CT this month, for 53-monthfollow-up.

## 2021-09-01 ENCOUNTER — Telehealth: Payer: Self-pay | Admitting: Nurse Practitioner

## 2021-09-01 NOTE — Telephone Encounter (Signed)
Called patient to confirm medication was on hand. And that patient did not need a new order sent in just just. Refills on hand if needed. Nothing further needed

## 2021-09-08 ENCOUNTER — Encounter (HOSPITAL_BASED_OUTPATIENT_CLINIC_OR_DEPARTMENT_OTHER): Payer: Medicare Other | Attending: General Surgery | Admitting: General Surgery

## 2021-09-08 DIAGNOSIS — D692 Other nonthrombocytopenic purpura: Secondary | ICD-10-CM | POA: Insufficient documentation

## 2021-09-08 DIAGNOSIS — I82411 Acute embolism and thrombosis of right femoral vein: Secondary | ICD-10-CM | POA: Insufficient documentation

## 2021-09-08 DIAGNOSIS — L97812 Non-pressure chronic ulcer of other part of right lower leg with fat layer exposed: Secondary | ICD-10-CM | POA: Insufficient documentation

## 2021-09-08 DIAGNOSIS — I890 Lymphedema, not elsewhere classified: Secondary | ICD-10-CM | POA: Diagnosis not present

## 2021-09-08 DIAGNOSIS — I872 Venous insufficiency (chronic) (peripheral): Secondary | ICD-10-CM | POA: Insufficient documentation

## 2021-09-08 DIAGNOSIS — E43 Unspecified severe protein-calorie malnutrition: Secondary | ICD-10-CM | POA: Insufficient documentation

## 2021-09-08 NOTE — Progress Notes (Signed)
CHIBUIKE, FLEEK ([id number]) Visit Report for 09/08/2021 Abuse Risk Screen Details Patient Name: Date of Service: [name], Leotha G. 09/08/2021 9:00 A M Medical Record Number: [id number] Patient Account Number: [id number] Date of Birth/Sex: Treating RN: 1935/03/24 (86 y.o. [name] Primary Care Faviola Klare: Adelfa Koh Other Clinician: Referring Lillar Bianca: Treating Danyle Boening/Extender: Jadene Pierini, SUSA N Weeks in Treatment: 0 Abuse Risk Screen Items Answer ABUSE RISK SCREEN: Has anyone close to you tried to hurt or harm you recentlyo No Do you feel uncomfortable with anyone in your familyo No Has anyone forced you do things that you didnt want to doo No Electronic Signature(s) Signed: 09/08/2021 12:04:04 PM By: Blanche East RN Signed: 09/08/2021 4:54:44 PM By: Baruch [name] RN, BSN Entered By: Blanche East on 09/08/2021 11:42:55 -------------------------------------------------------------------------------- Activities of Daily Living Details Patient Name: Date of Service: [name], Glennis G. 09/08/2021 9:00 A M Medical Record Number: [id number] Patient Account Number: [id number] Date of Birth/Sex: Treating RN: 1936/01/04 (86 y.o. [name] Primary Care Brittane Grudzinski: Adelfa Koh Other Clinician: Referring Jontue Crumpacker: Treating Careena Degraffenreid/Extender: Jadene Pierini, SUSA N Weeks in Treatment: 0 Activities of Daily Living Items Answer Activities of Daily Living (Please select one for each item) Drive Automobile Need Assistance T Medications ake Completely Able Use T elephone Completely Able Care for Appearance Completely Able Use T oilet Completely Able Bath / Shower Completely Able Dress Self Completely Able Feed Self Completely Able Walk Need Assistance Get In / Out Bed Need Assistance Housework Need Assistance Prepare Meals Need Assistance Handle Money Need Assistance Shop for Self Need Assistance Electronic Signature(s) Signed: 09/08/2021 12:04:04  PM By: Blanche East RN Signed: 09/08/2021 4:54:44 PM By: Baruch [name] RN, BSN Entered By: Blanche East on 09/08/2021 11:42:58 -------------------------------------------------------------------------------- Education Screening Details Patient Name: Date of Service: [name], Carold G. 09/08/2021 9:00 A M Medical Record Number: [id number] Patient Account Number: [id number] Date of Birth/Sex: Treating RN: 20-Aug-1935 (86 y.o. [name] Primary Care Ermal Haberer: Adelfa Koh Other Clinician: Referring Keyoni Lapinski: Treating Karlene Southard/Extender: Elam Dutch Weeks in Treatment: 0 Learning Preferences/Education Level/Primary Language Learning Preference: Explanation Highest Education Level: College or Above Preferred Language: English Cognitive Barrier Language Barrier: No Translator Needed: No Memory Deficit: No Emotional Barrier: No Cultural/Religious Beliefs Affecting Medical Care: No Physical Barrier Impaired Vision: Yes Glasses Impaired Hearing: Yes Decreased Hand dexterity: Yes Knowledge/Comprehension Knowledge Level: High Comprehension Level: High Ability to understand written instructions: High Ability to understand verbal instructions: High Motivation Anxiety Level: Calm Cooperation: Cooperative Education Importance: Acknowledges Need Interest in Health Problems: Asks Questions Perception: Coherent Willingness to Engage in Self-Management High Activities: Readiness to Engage in Self-Management High Activities: Electronic Signature(s) Signed: 09/08/2021 12:04:04 PM By: Blanche East RN Signed: 09/08/2021 4:54:44 PM By: Baruch [name] RN, BSN Entered By: Blanche East on 09/08/2021 11:43:04 -------------------------------------------------------------------------------- Fall Risk Assessment Details Patient Name: Date of Service: [name], Finis G. 09/08/2021 9:00 A M Medical Record Number: [id number] Patient Account Number: [id number] Date of  Birth/Sex: Treating RN: September 14, 1935 (86 y.o. [name] Primary Care Gracianna Vink: Adelfa Koh Other Clinician: Referring Dantavious Snowball: Treating Khandi Kernes/Extender: Jadene Pierini, SUSA N Weeks in Treatment: 0 Fall Risk Assessment Items Have you had 2 or more falls in the last 12 monthso 0 No Have you had any fall that resulted in injury in the last 12 monthso 0 No FALLS RISK SCREEN History of falling - immediate or within 3 months 0 No Secondary diagnosis (Do you have 2 or more medical diagnoseso)  0 No Ambulatory aid None/bed rest/wheelchair/nurse 0 No Crutches/cane/walker 15 Yes Furniture 0 No Intravenous therapy Access/Saline/Heparin Lock 0 No Gait/Transferring Normal/ bed rest/ wheelchair 0 No Weak (short steps with or without shuffle, stooped but able to lift head while walking, may seek 0 No support from furniture) Impaired (short steps with shuffle, may have difficulty arising from chair, head down, impaired 20 Yes balance) Mental Status Oriented to own ability 0 Yes Electronic Signature(s) Signed: 09/08/2021 12:04:04 PM By: Blanche East RN Signed: 09/08/2021 4:54:44 PM By: Baruch [name] RN, BSN Entered By: Blanche East on 09/08/2021 11:43:10 -------------------------------------------------------------------------------- Foot Assessment Details Patient Name: Date of Service: [name], Kaipo G. 09/08/2021 9:00 A M Medical Record Number: [id number] Patient Account Number: [id number] Date of Birth/Sex: Treating RN: March 01, 1935 (86 y.o. [name] Primary Care Kieryn Burtis: Adelfa Koh Other Clinician: Referring Koleton Duchemin: Treating Sahara Fujimoto/Extender: Jadene Pierini, SUSA N Weeks in Treatment: 0 Foot Assessment Items Site Locations + = Sensation present, - = Sensation absent, C = Callus, U = Ulcer R = Redness, W = Warmth, M = Maceration, PU = Pre-ulcerative lesion F = Fissure, S = Swelling, D = Dryness Assessment Right: Left: Other Deformity: No  No Prior Foot Ulcer: No No Prior Amputation: No No Charcot Joint: No No Ambulatory Status: Ambulatory With Help Assistance Device: Wheelchair Gait: Buyer, retail Signature(s) Signed: 09/08/2021 12:04:04 PM By: Blanche East RN Signed: 09/08/2021 4:54:44 PM By: Baruch [name] RN, BSN Entered By: Blanche East on 09/08/2021 09:33:41 -------------------------------------------------------------------------------- Nutrition Risk Screening Details Patient Name: Date of Service: [name], Jarion G. 09/08/2021 9:00 A M Medical Record Number: [id number] Patient Account Number: [id number] Date of Birth/Sex: Treating RN: 1935/03/18 (86 y.o. [name] Primary Care Silvia Hightower: Adelfa Koh Other Clinician: Referring Preeti Winegardner: Treating Aalyah Mansouri/Extender: Jadene Pierini, SUSA N Weeks in Treatment: 0 Height (in): 74 Weight (lbs): 274.4 Body Mass Index (BMI): 35.2 Nutrition Risk Screening Items Score Screening NUTRITION RISK SCREEN: I have an illness or condition that made me change the kind and/or amount of food I eat 0 No I eat fewer than two meals per day 0 No I eat few fruits and vegetables, or milk products 2 Yes I have three or more drinks of beer, liquor or wine almost every day 0 No I have tooth or mouth problems that make it hard for me to eat 0 No I don't always have enough money to buy the food I need 0 No I eat alone most of the time 0 No I take three or more different prescribed or over-the-counter drugs a day 1 Yes Without wanting to, I have lost or gained 10 pounds in the last six months 0 No I am not always physically able to shop, cook and/or feed myself 2 Yes Nutrition Protocols Good Risk Protocol Moderate Risk Protocol 0 Provide education on nutrition High Risk Proctocol Risk Level: Moderate Risk Score: 5 Electronic Signature(s) Signed: 09/08/2021 12:04:04 PM By: Blanche East RN Signed: 09/08/2021 4:54:44 PM By: Baruch [name] RN, BSN Entered By:  Blanche East on 09/08/2021 11:43:18
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
## 2021-09-08 NOTE — Progress Notes (Signed)
[name], [name] ([id number]) Visit Report for 09/08/2021 Allergy List Details Patient Name: Date of Service: [name], [name] 09/08/2021 9:00 A M Medical Record Number: [id number] Patient Account Number: [id number] Date of Birth/Sex: Treating RN: 1935/12/28 (86 y.o. [name] Primary Care [name]: Adelfa Koh Other Clinician: Referring Ori Kreiter: Treating Timber Marshman/Extender: [name], [name] Weeks in Treatment: 0 Allergies Active Allergies Sulfa (Sulfonamide Antibiotics) Allergy Notes Electronic Signature(s) Signed: 09/08/2021 12:04:04 PM By: Blanche East RN Entered By: Blanche East on 09/08/2021 11:42:39 -------------------------------------------------------------------------------- Arrival Information Details Patient Name: Date of Service: [name], [name] 09/08/2021 9:00 A M Medical Record Number: [id number] Patient Account Number: [id number] Date of Birth/Sex: Treating RN: 07-01-1935 (86 y.o. [name] Primary Care [name]: Adelfa Koh Other Clinician: Referring Etienne Mowers: Treating Hershell Brandl/Extender: Elam Dutch Weeks in Treatment: 0 Visit Information Patient Arrived: Wheel Chair Arrival Time: 09:08 Accompanied By: spouse Transfer Assistance: Other Patient Identification Verified: Yes Secondary Verification Process Completed: Yes Patient Requires Transmission-Based Precautions: No Patient Has Alerts: No History Since Last Visit All ordered tests and consults were completed: Yes Added or deleted any medications: Yes Any new allergies or adverse reactions: No Had a fall or experienced change in activities of daily living that may affect risk of falls: No Signs or symptoms of abuse/neglect since last visito No Hospitalized since last visit: No Implantable device outside of the clinic excluding cellular tissue based products placed in the center since last visit: No Electronic Signature(s) Signed: 09/08/2021 12:04:04 PM  By: Blanche East RN Entered By: Blanche East on 09/08/2021 11:42:27 -------------------------------------------------------------------------------- Clinic Level of Care Assessment Details Patient Name: Date of Service: [name], [name] 09/08/2021 9:00 A M Medical Record Number: [id number] Patient Account Number: [id number] Date of Birth/Sex: Treating RN: [birth year] (86 y.o. [name] Primary Care Jayion Schneck: Adelfa Koh Other Clinician: Referring [name]: Treating [name]: [name], [name] Weeks in Treatment: 0 Clinic Level of Care Assessment Items TOOL 1 Quantity Score X- 1 0 Use when EandM and Procedure is performed on INITIAL visit ASSESSMENTS - Nursing Assessment / Reassessment X- 1 20 General Physical Exam (combine w/ comprehensive assessment (listed just below) when performed on new pt. evals) X- 1 25 Comprehensive Assessment (HX, ROS, Risk Assessments, Wounds Hx, etc.) ASSESSMENTS - Wound and Skin Assessment / Reassessment '[]'$  - 0 Dermatologic / Skin Assessment (not related to wound area) ASSESSMENTS - Ostomy and/or Continence Assessment and Care '[]'$  - 0 Incontinence Assessment and Management '[]'$  - 0 Ostomy Care Assessment and Management (repouching, etc.) PROCESS - Coordination of Care X - Simple Patient / Family Education for ongoing care 1 15 '[]'$  - 0 Complex (extensive) Patient / Family Education for ongoing care X- 1 10 Staff obtains Programmer, systems, Records, T Results / Process Orders est '[]'$  - 0 Staff telephones HHA, Nursing Homes / Clarify orders / etc '[]'$  - 0 Routine Transfer to another Facility (non-emergent condition) '[]'$  - 0 Routine Hospital Admission (non-emergent condition) X- 1 15 New Admissions / Biomedical engineer / Ordering NPWT Apligraf, etc. , '[]'$  - 0 Emergency Hospital Admission (emergent condition) PROCESS - Special Needs '[]'$  - 0 Pediatric / Minor Patient Management '[]'$  - 0 Isolation Patient Management '[]'$  -  0 Hearing / Language / Visual special needs '[]'$  - 0 Assessment of Community assistance (transportation, D/C planning, etc.) '[]'$  - 0 Additional assistance / Altered mentation '[]'$  - 0 Support Surface(s) Assessment (bed, cushion, seat, etc.) INTERVENTIONS - Miscellaneous '[]'$  - 0 External ear exam '[]'$  -  0 Patient Transfer (multiple staff / Civil Service fast streamer / Similar devices) '[]'$  - 0 Simple Staple / Suture removal (25 or less) '[]'$  - 0 Complex Staple / Suture removal (26 or more) '[]'$  - 0 Hypo/Hyperglycemic Management (do not check if billed separately) X- 1 15 Ankle / Brachial Index (ABI) - do not check if billed separately Has the patient been seen at the hospital within the last three years: Yes Total Score: 100 Level Of Care: New/Established - Level 3 Electronic Signature(s) Signed: 09/08/2021 12:04:04 PM By: Blanche East RN Signed: 09/08/2021 12:04:04 PM By: Blanche East RN Entered By: Blanche East on 09/08/2021 11:46:35 -------------------------------------------------------------------------------- Compression Therapy Details Patient Name: Date of Service: [name], [name] 09/08/2021 9:00 A M Medical Record Number: [id number] Patient Account Number: [id number] Date of Birth/Sex: Treating RN: 04-25-1935 (86 y.o. [name] Primary Care [name]: Adelfa Koh Other Clinician: Referring Enola Siebers: Treating Kien Mirsky/Extender: [name], [name] Weeks in Treatment: 0 Compression Therapy Performed for Wound Assessment: Wound #8 Right,Lateral Lower Leg Performed By: Clinician Baruch [name], RN Compression Type: Four Layer Pre Treatment ABI: 1 Post Procedure Diagnosis Same as Pre-procedure Electronic Signature(s) Signed: 09/08/2021 12:04:04 PM By: Blanche East RN Entered By: Blanche East on 09/08/2021 10:30:36 -------------------------------------------------------------------------------- Encounter Discharge Information Details Patient Name: Date of Service: SA  [name], [name] 09/08/2021 9:00 A M Medical Record Number: [id number] Patient Account Number: [id number] Date of Birth/Sex: Treating RN: [birth year] (86 y.o. [name] Primary Care Kiesha Ensey: Adelfa Koh Other Clinician: Referring Nyasia Baxley: Treating Desaray Marschner/Extender: [name], [name] Weeks in Treatment: 0 Encounter Discharge Information Items Post Procedure Vitals Discharge Condition: Stable Temperature (F): 98.3 Ambulatory Status: Wheelchair Pulse (bpm): 70 Discharge Destination: Home Respiratory Rate (breaths/min): 18 Transportation: Private Auto Blood Pressure (mmHg): 135/66 Accompanied By: spouse Schedule Follow-up Appointment: Yes Clinical Summary of Care: Electronic Signature(s) Signed: 09/08/2021 12:04:04 PM By: Blanche East RN Entered By: Blanche East on 09/08/2021 10:32:24 -------------------------------------------------------------------------------- Lower Extremity Assessment Details Patient Name: Date of Service: [name], Niquan G. 09/08/2021 9:00 A M Medical Record Number: [id number] Patient Account Number: [id number] Date of Birth/Sex: Treating RN: 03/06/1935 (86 y.o. [name] Primary Care Asuncion Tapscott: Adelfa Koh Other Clinician: Referring Corbin Hott: Treating Averey Trompeter/Extender: [name], [name] Weeks in Treatment: 0 Edema Assessment Assessed: [Left: No] [Right: No] Edema: [Left: Ye] [Right: s] Calf Left: Right: Point of Measurement: From Medial Instep 39 cm Ankle Left: Right: Point of Measurement: From Medial Instep 25.3 cm Vascular Assessment Pulses: Dorsalis Pedis Palpable: [Right:Yes] Blood Pressure: Brachial: [Right:120] Dorsalis Pedis: 120 Ankle: Posterior Tibial: 88 Ankle Brachial Index: [Right:1.00] Electronic Signature(s) Signed: 09/08/2021 12:04:04 PM By: Blanche East RN Signed: 09/08/2021 4:54:44 PM By: Baruch [name] RN, BSN Entered By: Blanche East on 09/08/2021  09:41:51 -------------------------------------------------------------------------------- Multi Wound Chart Details Patient Name: Date of Service: [name], Tristain G. 09/08/2021 9:00 A M Medical Record Number: [id number] Patient Account Number: [id number] Date of Birth/Sex: Treating RN: 05/29/1935 (86 y.o. [name] Primary Care Matraca Hunkins: Adelfa Koh Other Clinician: Referring Senon Nixon: Treating Reyanne Hussar/Extender: [name], [name] Weeks in Treatment: 0 Vital Signs Height(in): 74 Pulse(bpm): 4 Weight(lbs): 274.4 Blood Pressure(mmHg): 135/66 Body Mass Index(BMI): 35.2 Temperature(F): 98.3 Respiratory Rate(breaths/min): 18 Photos: [8:No Photos Right, Lateral Lower Leg] [[name]/A:[name]/A [name]/A] Wound Location: [8:Gradually Appeared] [[name]/A:[name]/A] Wounding Event: [8:Venous Leg Ulcer] [[name]/A:[name]/A] Primary Etiology: [8:Cataracts, Chronic Obstructive] [[name]/A:[name]/A] Comorbid History: [8:Pulmonary Disease (COPD), Coronary Artery Disease, Deep Vein Thrombosis, Hypertension, Peripheral Venous Disease, Osteoarthritis, Received Chemotherapy, Received Radiation 09/01/2021] [[name]/A:[name]/A]  Date Acquired: [8:0] [[name]/A:[name]/A] Weeks of Treatment: [8:Open] [[name]/A:[name]/A] Wound Status: [8:No] [[name]/A:[name]/A] Wound Recurrence: [8:Yes] [[name]/A:[name]/A] Clustered Wound: [8:4] [[name]/A:[name]/A] Clustered Quantity: [8:9.6x2x0.1] [[name]/A:[name]/A] Measurements L x W x D (cm) [8:15.08] [[name]/A:[name]/A] A (cm) : rea [8:1.508] [[name]/A:[name]/A] Volume (cm) : [8:Full Thickness Without Exposed] [[name]/A:[name]/A] Classification: [8:Support Structures Medium] [[name]/A:[name]/A] Exudate A mount: [8:Serous] [[name]/A:[name]/A] Exudate Type: [8:amber] [[name]/A:[name]/A] Exudate Color: [8:Flat and Intact] [[name]/A:[name]/A] Wound Margin: [8:Small (1-33%)] [[name]/A:[name]/A] Granulation A mount: [8:Pink] [[name]/A:[name]/A] Granulation Quality: [8:Large (67-100%)] [[name]/A:[name]/A] Necrotic A mount: [8:Fat Layer (Subcutaneous Tissue): Yes [name]/A] Exposed Structures: [8:Fascia: No Tendon: No Muscle: No Joint: No Bone: No Small (1-33%)]  [[name]/A:[name]/A] Epithelialization: [8:Debridement - Selective/Open Wound [name]/A] Debridement: Pre-procedure Verification/Time Out 09:50 [[name]/A:[name]/A] Taken: [8:Lidocaine 4% Topical Solution] [[name]/A:[name]/A] Pain Control: [8:Slough] [[name]/A:[name]/A] Tissue Debrided: [8:Non-Viable Tissue] [[name]/A:[name]/A] Level: [8:13.2] [[name]/A:[name]/A] Debridement A (sq cm): [8:rea Curette] [[name]/A:[name]/A] Instrument: [8:Minimum] [[name]/A:[name]/A] Bleeding: [8:Pressure] [[name]/A:[name]/A] Hemostasis A chieved: [8:0] [[name]/A:[name]/A] Procedural Pain: [8:0] [[name]/A:[name]/A] Post Procedural Pain: [8:Procedure was tolerated well] [[name]/A:[name]/A] Debridement Treatment Response: [8:9.6x2x0.1] [[name]/A:[name]/A] Post Debridement Measurements L x W x D (cm) [8:1.508] [[name]/A:[name]/A] Post Debridement Volume: (cm) [8:Debridement] [[name]/A:[name]/A] Treatment Notes Electronic Signature(s) Signed: 09/08/2021 9:54:49 AM By: [name] MD FACS Signed: 09/08/2021 4:54:44 PM By: Baruch [name] RN, BSN Entered By: [name] on 09/08/2021 09:54:49 -------------------------------------------------------------------------------- Multi-Disciplinary Care Plan Details Patient Name: Date of Service: [name], Daanish G. 09/08/2021 9:00 A M Medical Record Number: [id number] Patient Account Number: [id number] Date of Birth/Sex: Treating RN: 1935/11/04 (86 y.o. [name] Primary Care Kenadee Gates: Adelfa Koh Other Clinician: Referring Davontay Watlington: Treating Catriona Dillenbeck/Extender: [name], [name] Weeks in Treatment: 0 Active Inactive Venous Leg Ulcer Nursing Diagnoses: Actual venous Insuffiency (use after diagnosis is confirmed) Goals: Patient will maintain optimal edema control Date Initiated: 09/08/2021 Target Resolution Date: 10/09/2021 Goal Status: Active Interventions: Assess peripheral edema status every visit. Compression as ordered Treatment Activities: Therapeutic compression applied : 09/08/2021 Notes: Wound/Skin Impairment Nursing Diagnoses: Impaired tissue integrity Goals: Patient  will have a decrease in wound volume by X% from date: (specify in notes) Date Initiated: 09/08/2021 Target Resolution Date: 10/09/2021 Goal Status: Active Interventions: Assess patient/caregiver ability to perform ulcer/skin care regimen upon admission and as needed Treatment Activities: Skin care regimen initiated : 09/08/2021 Topical wound management initiated : 09/08/2021 Notes: Electronic Signature(s) Signed: 09/08/2021 12:04:04 PM By: Blanche East RN Signed: 09/08/2021 4:54:44 PM By: Baruch [name] RN, BSN Entered By: Blanche East on 09/08/2021 10:25:04 -------------------------------------------------------------------------------- Pain Assessment Details Patient Name: Date of Service: [name], Dondre G. 09/08/2021 9:00 A M Medical Record Number: [id number] Patient Account Number: [id number] Date of Birth/Sex: Treating RN: [birth year] (86 y.o. [name] Primary Care Durrel Mcnee: Adelfa Koh Other Clinician: Referring Kennedey Digilio: Treating Dakarri Kessinger/Extender: [name], [name] Weeks in Treatment: 0 Active Problems Location of Pain Severity and Description of Pain Patient Has Paino Yes Site Locations Pain Location: Generalized Pain Rate the pain. Current Pain Level: 4 Pain Management and Medication Current Pain Management: Electronic Signature(s) Signed: 09/08/2021 12:04:04 PM By: Blanche East RN Signed: 09/08/2021 4:54:44 PM By: Baruch [name] RN, BSN Entered By: Blanche East on 09/08/2021 11:43:46 -------------------------------------------------------------------------------- Patient/Caregiver Education Details Patient Name: Date of Service: [name], Kirby G. 7/31/2023andnbsp9:00 [place] Record Number: [id number] Patient Account Number: [id number] Date of Birth/Gender: Treating RN: 1935-04-02 (86 y.o. [name] Primary Care Physician: Adelfa Koh Other Clinician: Referring Physician: Treating Physician/Extender: Martina Sinner in Treatment: 0 Education Assessment Education Provided To: Patient Education Topics Provided Venous:  Methods: Explain/Verbal Responses: Reinforcements needed, State content correctly Wound/Skin Impairment: Methods: Explain/Verbal Responses: Reinforcements needed, State content correctly Electronic Signature(s) Signed: 09/08/2021 12:04:04 PM By: Blanche East RN Entered By: Blanche East on 09/08/2021 10:25:25 -------------------------------------------------------------------------------- Wound Assessment Details Patient Name: Date of Service: [name], Tejay G. 09/08/2021 9:00 A M Medical Record Number: [id number] Patient Account Number: [id number] Date of Birth/Sex: Treating RN: 01/30/1936 (86 y.o. [name] Primary Care Germany Dodgen: Adelfa Koh Other Clinician: Referring Darek Eifler: Treating Arsalan Brisbin/Extender: [name], [name] Weeks in Treatment: 0 Wound Status Wound Number: 8 Primary Venous Leg Ulcer Etiology: Wound Location: Right, Lateral Lower Leg Wound Open Wounding Event: Gradually Appeared Status: Date Acquired: 09/01/2021 Comorbid Cataracts, Chronic Obstructive Pulmonary Disease (COPD), Weeks Of Treatment: 0 History: Coronary Artery Disease, Deep Vein Thrombosis, Hypertension, Clustered Wound: Yes Peripheral Venous Disease, Osteoarthritis, Received Chemotherapy, Received Radiation Wound Measurements Length: (cm) 9.6 Width: (cm) 2 Depth: (cm) 0.1 Clustered Quantity: 4 Area: (cm) 15.08 Volume: (cm) 1.508 Wound Description Classification: Full Thickness Without Exposed Support Stru Wound Margin: Flat and Intact Exudate Amount: Medium Exudate Type: Serous Exudate Color: amber Foul Odor After Cleansing: Slough/Fibrino % Reduction in Area: % Reduction in Volume: Epithelialization: Small (1-33%) Tunneling: No Undermining: No ctures No Yes Wound Bed Granulation Amount: Small (1-33%) Exposed Structure Granulation  Quality: Pink Fascia Exposed: No Necrotic Amount: Large (67-100%) Fat Layer (Subcutaneous Tissue) Exposed: Yes Necrotic Quality: Adherent Slough Tendon Exposed: No Muscle Exposed: No Joint Exposed: No Bone Exposed: No Treatment Notes Wound #8 (Lower Leg) Wound Laterality: Right, Lateral Cleanser Soap and Water Discharge Instruction: May shower and wash wound with dial antibacterial soap and water prior to dressing change. Peri-Wound Care Sween Lotion (Moisturizing lotion) Discharge Instruction: Apply moisturizing lotion as directed Topical Primary Dressing IODOFLEX 0.9% Cadexomer Iodine Pad 4x6 cm Discharge Instruction: Apply to wound bed as instructed Secondary Dressing ABD Pad, 8x10 Discharge Instruction: Apply over primary dressing as directed. Secured With Compression Wrap FourPress (4 layer compression wrap) Discharge Instruction: Apply four layer compression as directed. May also use Miliken CoFlex 2 layer compression system as alternative. Compression Stockings Add-Ons Electronic Signature(s) Signed: 09/08/2021 4:54:44 PM By: Baruch [name] RN, BSN Entered By: Baruch [name] on 09/08/2021 09:26:53 -------------------------------------------------------------------------------- Vitals Details Patient Name: Date of Service: [name], Jayko G. 09/08/2021 9:00 A M Medical Record Number: [id number] Patient Account Number: [id number] Date of Birth/Sex: Treating RN: 10/27/1935 (86 y.o. [name] Primary Care Jaskarn Schweer: Adelfa Koh Other Clinician: Referring Genna Casimir: Treating Zira Helinski/Extender: [name], [name] Weeks in Treatment: 0 Vital Signs Time Taken: 09:14 Temperature (F): 98.3 Height (in): 74 Pulse (bpm): 70 Source: Stated Respiratory Rate (breaths/min): 18 Weight (lbs): 274.4 Blood Pressure (mmHg): 135/66 Source: Stated Reference Range: 80 - 120 mg / dl Body Mass Index (BMI): 35.2 Electronic Signature(s) Signed: 09/08/2021  12:04:04 PM By: Blanche East RN Entered By: Blanche East on 09/08/2021 11:42:30

## 2021-09-15 ENCOUNTER — Encounter (HOSPITAL_BASED_OUTPATIENT_CLINIC_OR_DEPARTMENT_OTHER): Payer: Medicare Other | Attending: General Surgery | Admitting: General Surgery

## 2021-09-15 DIAGNOSIS — E43 Unspecified severe protein-calorie malnutrition: Secondary | ICD-10-CM | POA: Insufficient documentation

## 2021-09-15 DIAGNOSIS — L97812 Non-pressure chronic ulcer of other part of right lower leg with fat layer exposed: Secondary | ICD-10-CM | POA: Diagnosis present

## 2021-09-15 DIAGNOSIS — D692 Other nonthrombocytopenic purpura: Secondary | ICD-10-CM | POA: Diagnosis not present

## 2021-09-15 DIAGNOSIS — Z85038 Personal history of other malignant neoplasm of large intestine: Secondary | ICD-10-CM | POA: Diagnosis not present

## 2021-09-15 DIAGNOSIS — I714 Abdominal aortic aneurysm, without rupture, unspecified: Secondary | ICD-10-CM | POA: Diagnosis not present

## 2021-09-15 DIAGNOSIS — I2510 Atherosclerotic heart disease of native coronary artery without angina pectoris: Secondary | ICD-10-CM | POA: Diagnosis not present

## 2021-09-15 DIAGNOSIS — I10 Essential (primary) hypertension: Secondary | ICD-10-CM | POA: Diagnosis not present

## 2021-09-15 DIAGNOSIS — I872 Venous insufficiency (chronic) (peripheral): Secondary | ICD-10-CM | POA: Diagnosis not present

## 2021-09-15 DIAGNOSIS — I83009 Varicose veins of unspecified lower extremity with ulcer of unspecified site: Secondary | ICD-10-CM | POA: Diagnosis not present

## 2021-09-15 DIAGNOSIS — Z955 Presence of coronary angioplasty implant and graft: Secondary | ICD-10-CM | POA: Insufficient documentation

## 2021-09-15 DIAGNOSIS — I878 Other specified disorders of veins: Secondary | ICD-10-CM | POA: Insufficient documentation
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
## 2021-09-15 NOTE — Progress Notes (Signed)
[name], [name] ([id number]) Visit Report for 09/15/2021 Arrival Information Details Patient Name: Date of Service: [name], [name] 09/15/2021 7:30 A M Medical Record Number: [id number] Patient Account Number: [id number] Date of Birth/Sex: Treating RN: 12/08/1935 (86 y.o. [name] Primary Care Shantaya Bluestone: Haynes Hoehn Other Clinician: Referring Lucrezia Dehne: Treating Dayyan Krist/Extender: Mellody Drown in Treatment: 1 Visit Information History Since Last Visit All ordered tests and consults were completed: Yes Patient Arrived: Wheel Chair Added or deleted any medications: No Arrival Time: 07:44 Any new allergies or adverse reactions: No Accompanied By: wife Had a fall or experienced change in No Transfer Assistance: Manual activities of daily living that may affect Patient Requires Transmission-Based Precautions: No risk of falls: Patient Has Alerts: No Signs or symptoms of abuse/neglect since last visito No Hospitalized since last visit: No Implantable device outside of the clinic excluding No cellular tissue based products placed in the center since last visit: Has Dressing in Place as Prescribed: Yes Has Compression in Place as Prescribed: Yes Pain Present Now: No Electronic Signature(s) Signed: 09/15/2021 10:52:10 AM By: Blanche East RN Entered By: Blanche East on 09/15/2021 07:46:16 -------------------------------------------------------------------------------- Encounter Discharge Information Details Patient Name: Date of Service: [name], [name] 09/15/2021 7:30 A M Medical Record Number: [id number] Patient Account Number: [id number] Date of Birth/Sex: Treating RN: 02/06/1936 (86 y.o. [name] Primary Care Bianey Tesoro: Haynes Hoehn Other Clinician: Referring Jamiel Goncalves: Treating Jasemine Nawaz/Extender: Mellody Drown in Treatment: 1 Encounter Discharge Information Items Post Procedure Vitals Discharge Condition:  Stable Temperature (F): 98.0 Ambulatory Status: Wheelchair Pulse (bpm): 52 Discharge Destination: Home Respiratory Rate (breaths/min): 18 Transportation: Private Auto Blood Pressure (mmHg): 121/65 Accompanied By: spouse Schedule Follow-up Appointment: Yes Clinical Summary of Care: Electronic Signature(s) Signed: 09/15/2021 10:52:10 AM By: Blanche East RN Entered By: Blanche East on 09/15/2021 08:38:26 -------------------------------------------------------------------------------- Lower Extremity Assessment Details Patient Name: Date of Service: [name], [name] 09/15/2021 7:30 A M Medical Record Number: [id number] Patient Account Number: [id number] Date of Birth/Sex: Treating RN: August 01, 1935 (86 y.o. [name] Primary Care Roma Bierlein: Haynes Hoehn Other Clinician: Referring Shree Espey: Treating Marques Ericson/Extender: Mellody Drown in Treatment: 1 Edema Assessment Assessed: [name]: No] [Right: No] Edema: [Left: Ye] [Right: s] Calf Left: Right: Point of Measurement: From Medial Instep 38 cm Ankle Left: Right: Point of Measurement: From Medial Instep 24.5 cm Vascular Assessment Pulses: Dorsalis Pedis Palpable: [Right:Yes] Electronic Signature(s) Signed: 09/15/2021 10:52:10 AM By: Blanche East RN Signed: 09/15/2021 5:07:27 PM By: Baruch [name] RN, BSN Entered By: Blanche East on 09/15/2021 07:55:53 -------------------------------------------------------------------------------- Multi Wound Chart Details Patient Name: Date of Service: [name], [name] 09/15/2021 7:30 A M Medical Record Number: [id number] Patient Account Number: [id number] Date of Birth/Sex: Treating RN: Sep 25, 1935 (86 y.o. [name] Primary Care [name]: Haynes Hoehn Other Clinician: Referring [name]: Treating [name]/Extender: Mellody Drown in Treatment: 1 Vital Signs Height(in): 71 Pulse(bpm): 58 Weight(lbs): 274.4 Blood Pressure(mmHg):  121/65 Body Mass Index(BMI): 35.2 Temperature(F): 98.0 Respiratory Rate(breaths/min): 18 Photos: [8:Right, Lateral Lower Leg] [N/A:N/A N/A] Wound Location: [8:Gradually Appeared] [N/A:N/A] Wounding Event: [8:Venous Leg Ulcer] [N/A:N/A] Primary Etiology: [8:Cataracts, Chronic Obstructive] [N/A:N/A] Comorbid History: [8:Pulmonary Disease (COPD), Coronary Artery Disease, Deep Vein Thrombosis, Hypertension, Peripheral Venous Disease, Osteoarthritis, Received Chemotherapy, Received Radiation 09/01/2021] [N/A:N/A] Date Acquired: [8:1] [N/A:N/A] Weeks of Treatment: [8:Open] [N/A:N/A] Wound Status: [8:No] [N/A:N/A] Wound Recurrence: [8:Yes] [N/A:N/A] Clustered Wound: [8:4] [N/A:N/A] Clustered Quantity: [8:6.8x0.8x0.1] [N/A:N/A] Measurements L x W x D (cm) [8:4.273] [N/A:N/A] A (cm) : rea [8:0.427] [  N/A:N/A] Volume (cm) : [8:71.70%] [N/A:N/A] % Reduction in Area: [8:71.70%] [N/A:N/A] % Reduction in Volume: [8:Full Thickness Without Exposed] [N/A:N/A] Classification: [8:Support Structures Medium] [N/A:N/A] Exudate Amount: [8:Serous] [N/A:N/A] Exudate Type: [8:amber] [N/A:N/A] Exudate Color: [8:Flat and Intact] [N/A:N/A] Wound Margin: [8:Small (1-33%)] [N/A:N/A] Granulation Amount: [8:Pink] [N/A:N/A] Granulation Quality: [8:Large (67-100%)] [N/A:N/A] Necrotic Amount: [8:Fat Layer (Subcutaneous Tissue): Yes N/A] Exposed Structures: [8:Fascia: No Tendon: No Muscle: No Joint: No Bone: No Small (1-33%)] [N/A:N/A] Treatment Notes Electronic Signature(s) Signed: 09/15/2021 8:19:32 AM By: [name] MD FACS Signed: 09/15/2021 5:07:27 PM By: Baruch [name] RN, BSN Entered By: [name] on 09/15/2021 08:19:32 -------------------------------------------------------------------------------- Multi-Disciplinary Care Plan Details Patient Name: Date of Service: [name], [name] 09/15/2021 7:30 A M Medical Record Number: [id number] Patient Account Number: [id number] Date of  Birth/Sex: Treating RN: Nov 22, 1935 (86 y.o. [name] Primary Care Harlan Vinal: Haynes Hoehn Other Clinician: Referring Marlee Armenteros: Treating [name]/Extender: Mellody Drown in Treatment: 1 Active Inactive Venous Leg Ulcer Nursing Diagnoses: Actual venous Insuffiency (use after diagnosis is confirmed) Goals: Patient will maintain optimal edema control Date Initiated: 09/08/2021 Target Resolution Date: 10/09/2021 Goal Status: Active Interventions: Assess peripheral edema status every visit. Compression as ordered Treatment Activities: Therapeutic compression applied : 09/08/2021 Notes: Wound/Skin Impairment Nursing Diagnoses: Impaired tissue integrity Goals: Patient will have a decrease in wound volume by X% from date: (specify in notes) Date Initiated: 09/08/2021 Target Resolution Date: 10/09/2021 Goal Status: Active Interventions: Assess patient/caregiver ability to perform ulcer/skin care regimen upon admission and as needed Treatment Activities: Skin care regimen initiated : 09/08/2021 Topical wound management initiated : 09/08/2021 Notes: Electronic Signature(s) Signed: 09/15/2021 10:52:10 AM By: Blanche East RN Signed: 09/15/2021 5:07:27 PM By: Baruch [name] RN, BSN Entered By: Blanche East on 09/15/2021 08:03:26 -------------------------------------------------------------------------------- Pain Assessment Details Patient Name: Date of Service: [name], [name] 09/15/2021 7:30 A M Medical Record Number: [id number] Patient Account Number: [id number] Date of Birth/Sex: Treating RN: January 31, 1936 (85 y.o. [name] Primary Care Byrd Terrero: Haynes Hoehn Other Clinician: Referring Elzia Hott: Treating Nuha Degner/Extender: Mellody Drown in Treatment: 1 Active Problems Location of Pain Severity and Description of Pain Patient Has Paino No Site Locations Pain Management and Medication Current Pain Management: Electronic  Signature(s) Signed: 09/15/2021 10:52:10 AM By: Blanche East RN Signed: 09/15/2021 5:07:27 PM By: Baruch [name] RN, BSN Entered By: Blanche East on 09/15/2021 07:50:09 -------------------------------------------------------------------------------- Patient/Caregiver Education Details Patient Name: Date of Service: [name], Jabron G. 8/7/2023andnbsp7:30 A M Medical Record Number: [id number] Patient Account Number: [id number] Date of Birth/Gender: Treating RN: 1935-05-01 (86 y.o. [name] Primary Care Physician: Haynes Hoehn Other Clinician: Referring Physician: Treating Physician/Extender: Mellody Drown in Treatment: 1 Education Assessment Education Provided To: Patient Education Topics Provided Safety: Methods: Explain/Verbal Responses: Reinforcements needed, State content correctly Electronic Signature(s) Signed: 09/15/2021 10:52:10 AM By: Blanche East RN Entered By: Blanche East on 09/15/2021 08:04:25 -------------------------------------------------------------------------------- Wound Assessment Details Patient Name: Date of Service: [name], [name] 09/15/2021 7:30 A M Medical Record Number: [id number] Patient Account Number: [id number] Date of Birth/Sex: Treating RN: Jan 17, 1936 (86 y.o. [name] Primary Care Janziel Hockett: Haynes Hoehn Other Clinician: Referring Laquida Cotrell: Treating Toma Arts/Extender: Mellody Drown in Treatment: 1 Wound Status Wound Number: 8 Primary Venous Leg Ulcer Etiology: Wound Location: Right, Lateral Lower Leg Wound Open Wounding Event: Gradually Appeared Status: Date Acquired: 09/01/2021 Comorbid Cataracts, Chronic Obstructive Pulmonary Disease (COPD), Weeks Of Treatment: 1 History: Coronary Artery Disease, Deep Vein Thrombosis, Hypertension, Clustered Wound: Yes Peripheral Venous  Disease, Osteoarthritis, Received Chemotherapy, Received Radiation Photos Wound Measurements Length:  (cm) 6.8 Width: (cm) 0.8 Depth: (cm) 0.1 Clustered Quantity: 4 Area: (cm) 4.273 Volume: (cm) 0.427 % Reduction in Area: 71.7% % Reduction in Volume: 71.7% Epithelialization: Small (1-33%) Tunneling: No Undermining: No Wound Description Classification: Full Thickness Without Exposed Support Structures Wound Margin: Flat and Intact Exudate Amount: Medium Exudate Type: Serous Exudate Color: amber Foul Odor After Cleansing: No Slough/Fibrino Yes Wound Bed Granulation Amount: Small (1-33%) Exposed Structure Granulation Quality: Pink Fascia Exposed: No Necrotic Amount: Large (67-100%) Fat Layer (Subcutaneous Tissue) Exposed: Yes Necrotic Quality: Adherent Slough Tendon Exposed: No Muscle Exposed: No Joint Exposed: No Bone Exposed: No Treatment Notes Wound #8 (Lower Leg) Wound Laterality: Right, Lateral Cleanser Soap and Water Discharge Instruction: May shower and wash wound with dial antibacterial soap and water prior to dressing change. Peri-Wound Care Sween Lotion (Moisturizing lotion) Discharge Instruction: Apply moisturizing lotion as directed Topical Primary Dressing IODOFLEX 0.9% Cadexomer Iodine Pad 4x6 cm Discharge Instruction: Apply to wound bed as instructed Secondary Dressing ABD Pad, 8x10 Discharge Instruction: Apply over primary dressing as directed. Secured With Compression Wrap FourPress (4 layer compression wrap) Discharge Instruction: Apply four layer compression as directed. May also use Miliken CoFlex 2 layer compression system as alternative. Compression Stockings Add-Ons Electronic Signature(s) Signed: 09/15/2021 10:52:10 AM By: Blanche East RN Signed: 09/15/2021 5:07:27 PM By: Baruch [name] RN, BSN Entered By: Blanche East on 09/15/2021 08:03:19 -------------------------------------------------------------------------------- Vitals Details Patient Name: Date of Service: [name], Antwaun G. 09/15/2021 7:30 A M Medical Record Number:  [id number] Patient Account Number: [id number] Date of Birth/Sex: Treating RN: 13-Jun-1935 (86 y.o. [name] Primary Care Brianna Esson: Haynes Hoehn Other Clinician: Referring Raigan Baria: Treating Kayslee Furey/Extender: Mellody Drown in Treatment: 1 Vital Signs Time Taken: 07:49 Temperature (F): 98.0 Height (in): 74 Pulse (bpm): 52 Weight (lbs): 274.4 Respiratory Rate (breaths/min): 18 Body Mass Index (BMI): 35.2 Blood Pressure (mmHg): 121/65 Reference Range: 80 - 120 mg / dl Electronic Signature(s) Signed: 09/15/2021 10:52:10 AM By: Blanche East RN Entered By: Blanche East on 09/15/2021 07:49:59

## 2021-09-22 ENCOUNTER — Encounter (HOSPITAL_BASED_OUTPATIENT_CLINIC_OR_DEPARTMENT_OTHER): Payer: Medicare Other | Admitting: General Surgery

## 2021-09-22 DIAGNOSIS — L97812 Non-pressure chronic ulcer of other part of right lower leg with fat layer exposed: Secondary | ICD-10-CM | POA: Diagnosis not present
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
## 2021-09-26 ENCOUNTER — Ambulatory Visit
Admission: RE | Admit: 2021-09-26 | Discharge: 2021-09-26 | Disposition: A | Discharge status: Home / Self Care | Payer: Medicare Other | Source: Ambulatory Visit | Attending: Nurse Practitioner | Admitting: Nurse Practitioner

## 2021-09-26 DIAGNOSIS — R911 Solitary pulmonary nodule: Secondary | ICD-10-CM

## 2021-09-26 NOTE — Progress Notes (Signed)
[name], [name] ([id number]) Visit Report for 09/22/2021 Arrival Information Details Patient Name: Date of Service: [name], [name] 09/22/2021 10:30 A M Medical Record Number: [id number] Patient Account Number: [id number] Date of Birth/Sex: Treating RN: 1935-09-02 (86 y.o. [name] Primary Care Daichi Moris: Haynes Hoehn Other Clinician: Referring Yenny Kosa: Treating Josh Nicolosi/Extender: Mellody Drown in Treatment: 2 Visit Information History Since Last Visit All ordered tests and consults were completed: No Patient Arrived: Gilford Rile Added or deleted any medications: No Arrival Time: 10:59 Any new allergies or adverse reactions: No Accompanied By: spouse Had a fall or experienced change in No Transfer Assistance: None activities of daily living that may affect Patient Identification Verified: Yes risk of falls: Secondary Verification Process Completed: Yes Signs or symptoms of abuse/neglect since last visito No Patient Requires Transmission-Based Precautions: No Hospitalized since last visit: No Patient Has Alerts: No Implantable device outside of the clinic excluding No cellular tissue based products placed in the center since last visit: Has Dressing in Place as Prescribed: Yes Has Compression in Place as Prescribed: Yes Pain Present Now: No Electronic Signature(s) Signed: 09/22/2021 4:52:29 PM By: Baruch [name] RN, BSN Entered By: Baruch [name] on 09/22/2021 11:01:27 -------------------------------------------------------------------------------- Compression Therapy Details Patient Name: Date of Service: [name], [name] 09/22/2021 10:30 A M Medical Record Number: [id number] Patient Account Number: [id number] Date of Birth/Sex: Treating RN: Apr 22, 1935 (86 y.o. [name] Primary Care Malini Flemings: Haynes Hoehn Other Clinician: Referring Princessa Lesmeister: Treating Sherrol Vicars/Extender: Mellody Drown in Treatment: 2 Compression  Therapy Performed for Wound Assessment: Wound #8 Right,Lateral Lower Leg Performed By: Clinician Baruch [name], RN Compression Type: Four Layer Post Procedure Diagnosis Same as Pre-procedure Electronic Signature(s) Signed: 09/22/2021 4:52:29 PM By: Baruch [name] RN, BSN Entered By: Baruch [name] on 09/22/2021 11:29:35 -------------------------------------------------------------------------------- Encounter Discharge Information Details Patient Name: Date of Service: [name], [name] 09/22/2021 10:30 A M Medical Record Number: [id number] Patient Account Number: [id number] Date of Birth/Sex: Treating RN: 02/22/1935 (86 y.o. [name] Primary Care Luise Yamamoto: Haynes Hoehn Other Clinician: Referring Ester Mabe: Treating Jaxsyn Azam/Extender: Mellody Drown in Treatment: 2 Encounter Discharge Information Items Discharge Condition: Stable Ambulatory Status: Wheelchair Discharge Destination: Home Transportation: Private Auto Accompanied By: spouse Schedule Follow-up Appointment: Yes Clinical Summary of Care: Patient Declined Electronic Signature(s) Signed: 09/22/2021 4:52:29 PM By: Baruch [name] RN, BSN Entered By: Baruch [name] on 09/22/2021 11:52:35 -------------------------------------------------------------------------------- Lower Extremity Assessment Details Patient Name: Date of Service: [name], [name] 09/22/2021 10:30 A M Medical Record Number: [id number] Patient Account Number: [id number] Date of Birth/Sex: Treating RN: May 06, 1935 (86 y.o. [name] Primary Care Antania Hoefling: Haynes Hoehn Other Clinician: Referring Meggen Spaziani: Treating Tranae Laramie/Extender: Mellody Drown in Treatment: 2 Edema Assessment Assessed: [name]: No] [Right: No] Edema: [Left: Ye] [Right: s] Calf Left: Right: Point of Measurement: From Medial Instep 36 cm Ankle Left: Right: Point of Measurement: From Medial Instep 24.3 cm Vascular  Assessment Pulses: Dorsalis Pedis Palpable: [Right:Yes] Electronic Signature(s) Signed: 09/22/2021 4:52:29 PM By: Baruch [name] RN, BSN Entered By: Baruch [name] on 09/22/2021 11:09:38 -------------------------------------------------------------------------------- Multi Wound Chart Details Patient Name: Date of Service: [name], [name] 09/22/2021 10:30 A M Medical Record Number: [id number] Patient Account Number: [id number] Date of Birth/Sex: Treating RN: 06/09/1935 (86 y.o. [name] Primary Care Arieana Somoza: Haynes Hoehn Other Clinician: Referring Emillie Chasen: Treating Braidan Ricciardi/Extender: Mellody Drown in Treatment: 2 Vital Signs Height(in): 74 Pulse(bpm): 52 Weight(lbs): 274.4 Blood Pressure(mmHg): 131/67 Body Mass Index(BMI): 35.2 Temperature(F): 97.8 Respiratory  Rate(breaths/min): 18 Photos: [N/A:N/A] Right, Lateral Lower Leg N/A N/A Wound Location: Gradually Appeared N/A N/A Wounding Event: Venous Leg Ulcer N/A N/A Primary Etiology: Cataracts, Chronic Obstructive N/A N/A Comorbid History: Pulmonary Disease (COPD), Coronary Artery Disease, Deep Vein Thrombosis, Hypertension, Peripheral Venous Disease, Osteoarthritis, Received Chemotherapy, Received Radiation 09/01/2021 N/A N/A Date Acquired: 2 N/A N/A Weeks of Treatment: Open N/A N/A Wound Status: No N/A N/A Wound Recurrence: Yes N/A N/A Clustered Wound: 4 N/A N/A Clustered Quantity: 2x0.4x0.1 N/A N/A Measurements L x W x D (cm) 0.628 N/A N/A A (cm) : rea 0.063 N/A N/A Volume (cm) : 95.80% N/A N/A % Reduction in Area: 95.80% N/A N/A % Reduction in Volume: Full Thickness Without Exposed N/A N/A Classification: Support Structures Medium N/A N/A Exudate Amount: Serosanguineous N/A N/A Exudate Type: red, brown N/A N/A Exudate Color: Flat and Intact N/A N/A Wound Margin: Large (67-100%) N/A N/A Granulation Amount: Red, Pink N/A N/A Granulation Quality: None  Present (0%) N/A N/A Necrotic Amount: Fat Layer (Subcutaneous Tissue): Yes N/A N/A Exposed Structures: Fascia: No Tendon: No Muscle: No Joint: No Bone: No Medium (34-66%) N/A N/A Epithelialization: Compression Therapy N/A N/A Procedures Performed: Treatment Notes Electronic Signature(s) Signed: 09/22/2021 11:40:21 AM By: [name] MD FACS Signed: 09/22/2021 4:52:29 PM By: Baruch [name] RN, BSN Entered By: [name] on 09/22/2021 11:40:21 -------------------------------------------------------------------------------- Multi-Disciplinary Care Plan Details Patient Name: Date of Service: [name], Abdulkareem G. 09/22/2021 10:30 A M Medical Record Number: [id number] Patient Account Number: [id number] Date of Birth/Sex: Treating RN: [birth year] (86 y.o. [name] Primary Care Rainn Bullinger: Haynes Hoehn Other Clinician: Referring Jodel Mayhall: Treating Jatoya Armbrister/Extender: Mellody Drown in Treatment: 2 Multidisciplinary Care Plan reviewed with physician Active Inactive Venous Leg Ulcer Nursing Diagnoses: Actual venous Insuffiency (use after diagnosis is confirmed) Goals: Patient will maintain optimal edema control Date Initiated: 09/08/2021 Target Resolution Date: 10/09/2021 Goal Status: Active Interventions: Assess peripheral edema status every visit. Compression as ordered Treatment Activities: Therapeutic compression applied : 09/08/2021 Notes: Wound/Skin Impairment Nursing Diagnoses: Impaired tissue integrity Goals: Patient will have a decrease in wound volume by X% from date: (specify in notes) Date Initiated: 09/08/2021 Target Resolution Date: 10/09/2021 Goal Status: Active Interventions: Assess patient/caregiver ability to perform ulcer/skin care regimen upon admission and as needed Treatment Activities: Skin care regimen initiated : 09/08/2021 Topical wound management initiated : 09/08/2021 Notes: Electronic Signature(s) Signed:  09/22/2021 4:52:29 PM By: Baruch [name] RN, BSN Entered By: Baruch [name] on 09/22/2021 11:16:38 -------------------------------------------------------------------------------- Pain Assessment Details Patient Name: Date of Service: [name], Lyonel G. 09/22/2021 10:30 A M Medical Record Number: [id number] Patient Account Number: [id number] Date of Birth/Sex: Treating RN: [birth year] (86 y.o. [name] Primary Care Marykatherine Sherwood: Haynes Hoehn Other Clinician: Referring Mery Guadalupe: Treating Tawania Daponte/Extender: Mellody Drown in Treatment: 2 Active Problems Location of Pain Severity and Description of Pain Patient Has Paino Yes Site Locations Pain Location: Generalized Pain With Dressing Change: No Duration of the Pain. Constant / Intermittento Intermittent Rate the pain. Current Pain Level: 2 Character of Pain Describe the Pain: Aching Pain Management and Medication Current Pain Management: Medication: Yes Is the Current Pain Management Adequate: Adequate How does your wound impact your activities of daily livingo Sleep: No Bathing: No Appetite: No Relationship With Others: No Bladder Continence: No Emotions: No Bowel Continence: No Work: No Toileting: No Drive: No Dressing: No Hobbies: No Notes reports chronic knee and back pain Electronic Signature(s) Signed: 09/22/2021 4:52:29 PM By: Baruch [name] RN, BSN Entered By: Baruch [name] on  09/22/2021 11:03:11 -------------------------------------------------------------------------------- Patient/Caregiver Education Details Patient Name: Date of Service: [name], Kevork G. 8/14/2023andnbsp10:30 A M Medical Record Number: [id number] Patient Account Number: [id number] Date of Birth/Gender: Treating RN: [birth year] (86 y.o. [name] Primary Care Physician: Haynes Hoehn Other Clinician: Referring Physician: Treating Physician/Extender: Mellody Drown in Treatment:  2 Education Assessment Education Provided To: Patient Education Topics Provided Venous: Methods: Explain/Verbal Responses: Reinforcements needed, State content correctly Wound/Skin Impairment: Methods: Explain/Verbal Responses: Reinforcements needed, State content correctly Electronic Signature(s) Signed: 09/22/2021 4:52:29 PM By: Baruch [name] RN, BSN Entered By: Baruch [name] on 09/22/2021 11:16:57 -------------------------------------------------------------------------------- Wound Assessment Details Patient Name: Date of Service: [name], Aikam G. 09/22/2021 10:30 A M Medical Record Number: [id number] Patient Account Number: [id number] Date of Birth/Sex: Treating RN: 02-11-1935 (86 y.o. [name] Primary Care Hank Walling: Haynes Hoehn Other Clinician: Referring Pattijo Juste: Treating Blossom Crume/Extender: Mellody Drown in Treatment: 2 Wound Status Wound Number: 8 Primary Venous Leg Ulcer Etiology: Wound Location: Right, Lateral Lower Leg Wound Open Wounding Event: Gradually Appeared Status: Date Acquired: 09/01/2021 Comorbid Cataracts, Chronic Obstructive Pulmonary Disease (COPD), Weeks Of Treatment: 2 History: Coronary Artery Disease, Deep Vein Thrombosis, Hypertension, Clustered Wound: Yes Peripheral Venous Disease, Osteoarthritis, Received Chemotherapy, Received Radiation Photos Wound Measurements Length: (cm) 2 Width: (cm) 0.4 Depth: (cm) 0.1 Clustered Quantity: 4 Area: (cm) 0. Volume: (cm) 0. % Reduction in Area: 95.8% % Reduction in Volume: 95.8% Epithelialization: Medium (34-66%) Tunneling: No 628 Undermining: No 063 Wound Description Classification: Full Thickness Without Exposed Support Structures Wound Margin: Flat and Intact Exudate Amount: Medium Exudate Type: Serosanguineous Exudate Color: red, brown Foul Odor After Cleansing: No Slough/Fibrino No Wound Bed Granulation Amount: Large (67-100%) Exposed  Structure Granulation Quality: Red, Pink Fascia Exposed: No Necrotic Amount: None Present (0%) Fat Layer (Subcutaneous Tissue) Exposed: Yes Tendon Exposed: No Muscle Exposed: No Joint Exposed: No Bone Exposed: No Treatment Notes Wound #8 (Lower Leg) Wound Laterality: Right, Lateral Cleanser Soap and Water Discharge Instruction: May shower and wash wound with dial antibacterial soap and water prior to dressing change. Peri-Wound Care Sween Lotion (Moisturizing lotion) Discharge Instruction: Apply moisturizing lotion as directed Topical Primary Dressing KerraCel Ag Gelling Fiber Dressing, 2x2 in (silver alginate) Discharge Instruction: Apply silver alginate to wound bed as instructed Secondary Dressing Woven Gauze Sponge, Non-Sterile 4x4 in Discharge Instruction: Apply over primary dressing as directed. Secured With Compression Wrap FourPress (4 layer compression wrap) Discharge Instruction: Apply four layer compression as directed. May also use Miliken CoFlex 2 layer compression system as alternative. Compression Stockings Add-Ons Electronic Signature(s) Signed: 09/22/2021 4:52:29 PM By: Baruch [name] RN, BSN Entered By: Baruch [name] on 09/22/2021 11:14:54 -------------------------------------------------------------------------------- Vitals Details Patient Name: Date of Service: [name], Cowan G. 09/22/2021 10:30 A M Medical Record Number: [id number] Patient Account Number: [id number] Date of Birth/Sex: Treating RN: Jan 20, 1936 (86 y.o. [name] Primary Care Shyteria Lewis: Haynes Hoehn Other Clinician: Referring Amorina Doerr: Treating Imre Vecchione/Extender: Mellody Drown in Treatment: 2 Vital Signs Time Taken: 11:01 Temperature (F): 97.8 Height (in): 74 Pulse (bpm): 52 Weight (lbs): 274.4 Respiratory Rate (breaths/min): 18 Body Mass Index (BMI): 35.2 Blood Pressure (mmHg): 131/67 Reference Range: 80 - 120 mg / dl Electronic  Signature(s) Signed: 09/22/2021 4:52:29 PM By: Baruch [name] RN, BSN Entered By: Baruch [name] on 09/22/2021 11:02:19

## 2021-09-29 ENCOUNTER — Encounter (HOSPITAL_BASED_OUTPATIENT_CLINIC_OR_DEPARTMENT_OTHER): Payer: Medicare Other | Admitting: General Surgery

## 2021-09-29 DIAGNOSIS — L97812 Non-pressure chronic ulcer of other part of right lower leg with fat layer exposed: Secondary | ICD-10-CM | POA: Diagnosis not present
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
## 2021-09-29 NOTE — Progress Notes (Signed)
[name], [name] ([id number]) Visit Report for 09/29/2021 Arrival Information Details Patient Name: Date of Service: [name], [name] 09/29/2021 10:30 A M Medical Record Number: [id number] Patient Account Number: [id number] Date of Birth/Sex: Treating RN: 11/24/1935 (86 y.o. [name] Primary Care [name]: [name] Other Clinician: Referring [name]: Treating [name]/Extender: [name] in Treatment: 3 Visit Information History Since Last Visit All ordered tests and consults were completed: Yes Patient Arrived: Wheel Chair Added or deleted any medications: No Arrival Time: 10:26 Any new allergies or adverse reactions: No Accompanied By: wife Had a fall or experienced change in No Transfer Assistance: EasyPivot Patient Lift activities of daily living that may affect Patient Identification Verified: Yes risk of falls: Secondary Verification Process Completed: Yes Signs or symptoms of abuse/neglect since last visito No Patient Requires Transmission-Based Precautions: No Hospitalized since last visit: No Patient Has Alerts: No Implantable device outside of the clinic excluding No cellular tissue based products placed in the center since last visit: Has Dressing in Place as Prescribed: Yes Pain Present Now: No Electronic Signature(s) Signed: 09/29/2021 5:22:00 PM By: [name] East RN Entered By: [name] on 09/29/2021 10:29:38 -------------------------------------------------------------------------------- Compression Therapy Details Patient Name: Date of Service: [name], [name] 09/29/2021 10:30 A M Medical Record Number: [id number] Patient Account Number: [id number] Date of Birth/Sex: Treating RN: [birth year] (85 y.o. [name] Primary Care Aqil Goetting: [name] Other Clinician: Referring [name]: Treating [name] in Treatment: 3 Compression Therapy Performed for Wound Assessment:  Wound #8 Right,Lateral Lower Leg Performed By: Clinician [name] East, RN Compression Type: Four Layer Post Procedure Diagnosis Same as Pre-procedure Electronic Signature(s) Signed: 09/29/2021 5:22:00 PM By: [name] East RN Entered By: [name] on 09/29/2021 10:46:54 -------------------------------------------------------------------------------- Encounter Discharge Information Details Patient Name: Date of Service: [name], [name] 09/29/2021 10:30 A M Medical Record Number: [id number] Patient Account Number: [id number] Date of Birth/Sex: Treating RN: [birth year] (86 y.o. [name] Primary Care [name]: [name] Other Clinician: Referring Kaion Tisdale: Treating Teyonna Plaisted/Extender: [name] in Treatment: 3 Encounter Discharge Information Items Post Procedure Vitals Discharge Condition: Stable Temperature (F): 98.4 Ambulatory Status: Wheelchair Pulse (bpm): 50 Discharge Destination: Home Respiratory Rate (breaths/min): 18 Transportation: Private Auto Blood Pressure (mmHg): 127/60 Accompanied By: wife Schedule Follow-up Appointment: Yes Clinical Summary of Care: Electronic Signature(s) Signed: 09/29/2021 5:22:00 PM By: [name] East RN Entered By: [name] on 09/29/2021 11:00:44 -------------------------------------------------------------------------------- Lower Extremity Assessment Details Patient Name: Date of Service: [name], [name] 09/29/2021 10:30 A M Medical Record Number: [id number] Patient Account Number: [id number] Date of Birth/Sex: Treating RN: 03/18/1935 (86 y.o. [name] Primary Care [name]: [name] Other Clinician: Referring Brodee Mauritz: Treating Tymere Depuy/Extender: [name] in Treatment: 3 Edema Assessment Assessed: [name]: No] [Right: No] Edema: [Left: Ye] [Right: s] Calf Left: Right: Point of Measurement: From Medial Instep 36.5 cm Ankle Left: Right: Point of  Measurement: From Medial Instep 24 cm Vascular Assessment Pulses: Dorsalis Pedis Palpable: [Right:Yes] Electronic Signature(s) Signed: 09/29/2021 5:22:00 PM By: [name] East RN Entered By: [name] on 09/29/2021 10:36:05 -------------------------------------------------------------------------------- Multi Wound Chart Details Patient Name: Date of Service: [name], [name] 09/29/2021 10:30 A M Medical Record Number: [id number] Patient Account Number: [id number] Date of Birth/Sex: Treating RN: 1935-08-15 (86 y.o. [name] Primary Care [name]: [name] Other Clinician: Referring Shawnese Magner: Treating Savera Donson/Extender: [name] in Treatment: 3 Vital Signs Height(in): 74 Pulse(bpm): 50 Weight(lbs): 274.4 Blood Pressure(mmHg): 127/60  Body Mass Index(BMI): 35.2 Temperature(F): 98.4 Respiratory Rate(breaths/min): 18 Photos: [N/A:N/A] Right, Lateral Lower Leg N/A N/A Wound Location: Gradually Appeared N/A N/A Wounding Event: Venous Leg Ulcer N/A N/A Primary Etiology: Cataracts, Chronic Obstructive N/A N/A Comorbid History: Pulmonary Disease (COPD), Coronary Artery Disease, Deep Vein Thrombosis, Hypertension, Peripheral Venous Disease, Osteoarthritis, Received Chemotherapy, Received Radiation 09/01/2021 N/A N/A Date Acquired: 3 N/A N/A Weeks of Treatment: Open N/A N/A Wound Status: No N/A N/A Wound Recurrence: Yes N/A N/A Clustered Wound: 4 N/A N/A Clustered Quantity: 1x0.4x0.1 N/A N/A Measurements L x W x D (cm) 0.314 N/A N/A A (cm) : rea 0.031 N/A N/A Volume (cm) : 97.90% N/A N/A % Reduction in A rea: 97.90% N/A N/A % Reduction in Volume: Full Thickness Without Exposed N/A N/A Classification: Support Structures Medium N/A N/A Exudate A mount: Serosanguineous N/A N/A Exudate Type: red, brown N/A N/A Exudate Color: Flat and Intact N/A N/A Wound Margin: Large (67-100%) N/A N/A Granulation A  mount: Red, Pink N/A N/A Granulation Quality: None Present (0%) N/A N/A Necrotic A mount: Fat Layer (Subcutaneous Tissue): Yes N/A N/A Exposed Structures: Fascia: No Tendon: No Muscle: No Joint: No Bone: No Medium (34-66%) N/A N/A Epithelialization: Debridement - Selective/Open Wound N/A N/A Debridement: Pre-procedure Verification/Time Out 10:45 N/A N/A Taken: Lidocaine 5% topical ointment N/A N/A Pain Control: Necrotic/Eschar N/A N/A Tissue Debrided: Non-Viable Tissue N/A N/A Level: 0.4 N/A N/A Debridement A (sq cm): rea Curette N/A N/A Instrument: Minimum N/A N/A Bleeding: Pressure N/A N/A Hemostasis A chieved: 0 N/A N/A Procedural Pain: 0 N/A N/A Post Procedural Pain: Procedure was tolerated well N/A N/A Debridement Treatment Response: 1x0.4x0.1 N/A N/A Post Debridement Measurements L x W x D (cm) 0.031 N/A N/A Post Debridement Volume: (cm) Compression Therapy N/A N/A Procedures Performed: Debridement Treatment Notes Electronic Signature(s) Signed: 09/29/2021 10:47:01 AM By: [name] MD FACS Signed: 09/29/2021 5:48:43 PM By: Baruch [name] RN, BSN Entered By: [name] on 09/29/2021 10:47:00 -------------------------------------------------------------------------------- Multi-Disciplinary Care Plan Details Patient Name: Date of Service: [name], [name] 09/29/2021 10:30 A M Medical Record Number: [id number] Patient Account Number: [id number] Date of Birth/Sex: Treating RN: [birth year] (86 y.o. [name] Primary Care Patrica Mendell: [name] Other Clinician: Referring Alekzander Cardell: Treating Curren Mohrmann/Extender: [name] in Treatment: 3 Multidisciplinary Care Plan reviewed with physician Active Inactive Venous Leg Ulcer Nursing Diagnoses: Actual venous Insuffiency (use after diagnosis is confirmed) Goals: Patient will maintain optimal edema control Date Initiated: 09/08/2021 Target Resolution Date:  10/27/2021 Goal Status: Active Interventions: Assess peripheral edema status every visit. Compression as ordered Treatment Activities: Therapeutic compression applied : 09/08/2021 Notes: Electronic Signature(s) Signed: 09/29/2021 5:22:00 PM By: [name] East RN Entered By: [name] on 09/29/2021 10:41:09 -------------------------------------------------------------------------------- Pain Assessment Details Patient Name: Date of Service: [name], [name] 09/29/2021 10:30 A M Medical Record Number: [id number] Patient Account Number: [id number] Date of Birth/Sex: Treating RN: Aug 12, 1935 (86 y.o. [name] Primary Care Kathleene Bergemann: [name] Other Clinician: Referring Lindsie Simar: Treating Ashari Llewellyn/Extender: [name] in Treatment: 3 Active Problems Location of Pain Severity and Description of Pain Patient Has Paino No Site Locations Pain Management and Medication Current Pain Management: Electronic Signature(s) Signed: 09/29/2021 5:22:00 PM By: [name] East RN Entered By: [name] on 09/29/2021 10:31:14 -------------------------------------------------------------------------------- Patient/Caregiver Education Details Patient Name: Date of Service: [name], Kamori G. 8/21/2023andnbsp10:30 A M Medical Record Number: [id number] Patient Account Number: [id number] Date of Birth/Gender: Treating RN: 09-13-1935 (86 y.o. [name] Primary Care Physician: [name] Other  Clinician: Referring Physician: Treating Physician/Extender: [name] in Treatment: 3 Education Assessment Education Provided To: Patient Education Topics Provided Wound/Skin Impairment: Methods: Explain/Verbal Responses: Reinforcements needed, State content correctly Electronic Signature(s) Signed: 09/29/2021 5:22:00 PM By: [name] East RN Entered By: [name] on 09/29/2021  10:41:34 -------------------------------------------------------------------------------- Wound Assessment Details Patient Name: Date of Service: [name], [name] 09/29/2021 10:30 A M Medical Record Number: [id number] Patient Account Number: [id number] Date of Birth/Sex: Treating RN: [birth year] (86 y.o. [name] Primary Care Jozlin Bently: [name] Other Clinician: Referring Ota Ebersole: Treating Celines Femia/Extender: [name] in Treatment: 3 Wound Status Wound Number: 8 Primary Venous Leg Ulcer Etiology: Wound Location: Right, Lateral Lower Leg Wound Open Wounding Event: Gradually Appeared Status: Date Acquired: 09/01/2021 Comorbid Cataracts, Chronic Obstructive Pulmonary Disease (COPD), Weeks Of Treatment: 3 History: Coronary Artery Disease, Deep Vein Thrombosis, Hypertension, Clustered Wound: Yes Peripheral Venous Disease, Osteoarthritis, Received Chemotherapy, Received Radiation Photos Wound Measurements Length: (cm) 1 Width: (cm) 0.4 Depth: (cm) 0.1 Clustered Quantity: 4 Area: (cm) 0.314 Volume: (cm) 0.031 % Reduction in Area: 97.9% % Reduction in Volume: 97.9% Epithelialization: Medium (34-66%) Tunneling: No Undermining: No Wound Description Classification: Full Thickness Without Exposed Support Structures Wound Margin: Flat and Intact Exudate Amount: Medium Exudate Type: Serosanguineous Exudate Color: red, brown Foul Odor After Cleansing: No Slough/Fibrino No Wound Bed Granulation Amount: Large (67-100%) Exposed Structure Granulation Quality: Red, Pink Fascia Exposed: No Necrotic Amount: None Present (0%) Fat Layer (Subcutaneous Tissue) Exposed: Yes Tendon Exposed: No Muscle Exposed: No Joint Exposed: No Bone Exposed: No Treatment Notes Wound #8 (Lower Leg) Wound Laterality: Right, Lateral Cleanser Soap and Water Discharge Instruction: May shower and wash wound with dial antibacterial soap and water prior to dressing  change. Peri-Wound Care Sween Lotion (Moisturizing lotion) Discharge Instruction: Apply moisturizing lotion as directed Topical Primary Dressing KerraCel Ag Gelling Fiber Dressing, 2x2 in (silver alginate) Discharge Instruction: Apply silver alginate to wound bed as instructed Secondary Dressing Woven Gauze Sponge, Non-Sterile 4x4 in Discharge Instruction: Apply over primary dressing as directed. Secured With Compression Wrap FourPress (4 layer compression wrap) Discharge Instruction: Apply four layer compression as directed. May also use Miliken CoFlex 2 layer compression system as alternative. Compression Stockings Add-Ons Electronic Signature(s) Signed: 09/29/2021 5:22:00 PM By: [name] East RN Entered By: [name] on 09/29/2021 10:39:40 -------------------------------------------------------------------------------- Vitals Details Patient Name: Date of Service: [name], [name] 09/29/2021 10:30 A M Medical Record Number: [id number] Patient Account Number: [id number] Date of Birth/Sex: Treating RN: 09/09/1935 (86 y.o. [name] Primary Care Onalee Steinbach: [name] Other Clinician: Referring [place] Behlke: Treating Trong Gosling/Extender: [name] in Treatment: 3 Vital Signs Time Taken: 10:29 Temperature (F): 98.4 Height (in): 74 Pulse (bpm): 50 Weight (lbs): 274.4 Respiratory Rate (breaths/min): 18 Body Mass Index (BMI): 35.2 Blood Pressure (mmHg): 127/60 Reference Range: 80 - 120 mg / dl Electronic Signature(s) Signed: 09/29/2021 5:22:00 PM By: [name] East RN Entered By: [name] on 09/29/2021 10:31:07

## 2021-09-30 NOTE — Progress Notes (Signed)
Please notify patient that his CT chest shows that the right upper lobe nodule is stable. We will continue to monitor this and repeat his CT chest in 6 months. Thanks.

## 2021-10-06 ENCOUNTER — Encounter (HOSPITAL_BASED_OUTPATIENT_CLINIC_OR_DEPARTMENT_OTHER): Payer: Medicare Other | Admitting: General Surgery

## 2021-10-06 DIAGNOSIS — L97812 Non-pressure chronic ulcer of other part of right lower leg with fat layer exposed: Secondary | ICD-10-CM | POA: Diagnosis not present

## 2021-10-07 NOTE — Progress Notes (Signed)
JORAH, HUA ([id number]) Visit Report for 10/06/2021 Arrival Information Details Patient Name: Date of Service: [name], [name] 10/06/2021 10:30 A M Medical Record Number: [id number] Patient Account Number: [id number] Date of Birth/Sex: Treating RN: 1935-04-29 (86 y.o. Ulyses Amor, [name] Primary Care Afton Mikelson: Haynes Hoehn Other Clinician: Referring Mayla Biddy: Treating Denym Rahimi/Extender: Mellody Drown in Treatment: 4 Visit Information History Since Last Visit Added or deleted any medications: No Patient Arrived: Wheel Chair Any new allergies or adverse reactions: No Arrival Time: 10:37 Had a fall or experienced change in No Accompanied By: spouse activities of daily living that may affect Transfer Assistance: None risk of falls: Patient Identification Verified: Yes Signs or symptoms of abuse/neglect since last visito No Secondary Verification Process Completed: Yes Hospitalized since last visit: No Patient Requires Transmission-Based Precautions: No Implantable device outside of the clinic excluding No Patient Has Alerts: No cellular tissue based products placed in the center since last visit: Has Dressing in Place as Prescribed: Yes Has Compression in Place as Prescribed: Yes Pain Present Now: No Electronic Signature(s) Signed: 10/07/2021 6:08:33 PM By: Baruch [name] RN, BSN Entered By: Baruch [name] on 10/06/2021 10:41:48 -------------------------------------------------------------------------------- Clinic Level of Care Assessment Details Patient Name: Date of Service: [name], [name] 10/06/2021 10:30 A M Medical Record Number: [id number] Patient Account Number: [id number] Date of Birth/Sex: Treating RN: 1935-07-09 (86 y.o. [name] Primary Care Cheetara Hoge: Haynes Hoehn Other Clinician: Referring Garyn Arlotta: Treating Axil Copeman/Extender: Mellody Drown in Treatment: 4 Clinic Level of Care Assessment Items TOOL 4  Quantity Score '[]'$  - 0 Use when only an EandM is performed on FOLLOW-UP visit ASSESSMENTS - Nursing Assessment / Reassessment X- 1 10 Reassessment of Co-morbidities (includes updates in patient status) X- 1 5 Reassessment of Adherence to Treatment Plan ASSESSMENTS - Wound and Skin A ssessment / Reassessment X - Simple Wound Assessment / Reassessment - one wound 1 5 '[]'$  - 0 Complex Wound Assessment / Reassessment - multiple wounds '[]'$  - 0 Dermatologic / Skin Assessment (not related to wound area) ASSESSMENTS - Focused Assessment X- 1 5 Circumferential Edema Measurements - multi extremities '[]'$  - 0 Nutritional Assessment / Counseling / Intervention X- 1 5 Lower Extremity Assessment (monofilament, tuning fork, pulses) '[]'$  - 0 Peripheral Arterial Disease Assessment (using hand held doppler) ASSESSMENTS - Ostomy and/or Continence Assessment and Care '[]'$  - 0 Incontinence Assessment and Management '[]'$  - 0 Ostomy Care Assessment and Management (repouching, etc.) PROCESS - Coordination of Care X - Simple Patient / Family Education for ongoing care 1 15 '[]'$  - 0 Complex (extensive) Patient / Family Education for ongoing care X- 1 10 Staff obtains Programmer, systems, Records, T Results / Process Orders est '[]'$  - 0 Staff telephones HHA, Nursing Homes / Clarify orders / etc '[]'$  - 0 Routine Transfer to another Facility (non-emergent condition) '[]'$  - 0 Routine Hospital Admission (non-emergent condition) '[]'$  - 0 New Admissions / Biomedical engineer / Ordering NPWT Apligraf, etc. , '[]'$  - 0 Emergency Hospital Admission (emergent condition) X- 1 10 Simple Discharge Coordination '[]'$  - 0 Complex (extensive) Discharge Coordination PROCESS - Special Needs '[]'$  - 0 Pediatric / Minor Patient Management '[]'$  - 0 Isolation Patient Management '[]'$  - 0 Hearing / Language / Visual special needs '[]'$  - 0 Assessment of Community assistance (transportation, D/C planning, etc.) '[]'$  - 0 Additional assistance / Altered  mentation '[]'$  - 0 Support Surface(s) Assessment (bed, cushion, seat, etc.) INTERVENTIONS - Wound Cleansing / Measurement X - Simple Wound Cleansing - one wound 1  5 '[]'$  - 0 Complex Wound Cleansing - multiple wounds X- 1 5 Wound Imaging (photographs - any number of wounds) '[]'$  - 0 Wound Tracing (instead of photographs) '[]'$  - 0 Simple Wound Measurement - one wound '[]'$  - 0 Complex Wound Measurement - multiple wounds INTERVENTIONS - Wound Dressings '[]'$  - 0 Small Wound Dressing one or multiple wounds '[]'$  - 0 Medium Wound Dressing one or multiple wounds '[]'$  - 0 Large Wound Dressing one or multiple wounds '[]'$  - 0 Application of Medications - topical '[]'$  - 0 Application of Medications - injection INTERVENTIONS - Miscellaneous '[]'$  - 0 External ear exam '[]'$  - 0 Specimen Collection (cultures, biopsies, blood, body fluids, etc.) '[]'$  - 0 Specimen(s) / Culture(s) sent or taken to Lab for analysis '[]'$  - 0 Patient Transfer (multiple staff / Civil Service fast streamer / Similar devices) '[]'$  - 0 Simple Staple / Suture removal (25 or less) '[]'$  - 0 Complex Staple / Suture removal (26 or more) '[]'$  - 0 Hypo / Hyperglycemic Management (close monitor of Blood Glucose) '[]'$  - 0 Ankle / Brachial Index (ABI) - do not check if billed separately X- 1 5 Vital Signs Has the patient been seen at the hospital within the last three years: Yes Total Score: 80 Level Of Care: New/Established - Level 3 Electronic Signature(s) Signed: 10/07/2021 6:08:33 PM By: Baruch [name] RN, BSN Entered By: Baruch [name] on 10/06/2021 11:07:05 -------------------------------------------------------------------------------- Encounter Discharge Information Details Patient Name: Date of Service: [name], [name] 10/06/2021 10:30 A M Medical Record Number: [id number] Patient Account Number: [id number] Date of Birth/Sex: Treating RN: 1935/02/15 (86 y.o. [name] Primary Care Yaiden Yang: Haynes Hoehn Other Clinician: Referring  Geroge Gilliam: Treating Emmaline Wahba/Extender: Mellody Drown in Treatment: 4 Encounter Discharge Information Items Discharge Condition: Stable Ambulatory Status: Wheelchair Discharge Destination: Home Transportation: Private Auto Accompanied By: spouse Schedule Follow-up Appointment: Yes Clinical Summary of Care: Patient Declined Electronic Signature(s) Signed: 10/07/2021 6:08:33 PM By: Baruch [name] RN, BSN Entered By: Baruch [name] on 10/06/2021 11:08:11 -------------------------------------------------------------------------------- Lower Extremity Assessment Details Patient Name: Date of Service: [name], Jasier G. 10/06/2021 10:30 A M Medical Record Number: [id number] Patient Account Number: [id number] Date of Birth/Sex: Treating RN: 08-05-1935 (86 y.o. [name] Primary Care Ignacia Gentzler: Haynes Hoehn Other Clinician: Referring Nassir Neidert: Treating Tandrea Kommer/Extender: Mellody Drown in Treatment: 4 Edema Assessment Assessed: [name]: No] Patrice Paradise: No] Edema: [Left: Ye] [Right: s] Calf Left: Right: Point of Measurement: From Medial Instep 36.5 cm Ankle Left: Right: Point of Measurement: From Medial Instep 24 cm Vascular Assessment Pulses: Dorsalis Pedis Palpable: [Right:Yes] Electronic Signature(s) Signed: 10/07/2021 6:08:33 PM By: Baruch [name] RN, BSN Entered By: Baruch [name] on 10/06/2021 10:49:26 -------------------------------------------------------------------------------- Multi Wound Chart Details Patient Name: Date of Service: [name], Reynaldo G. 10/06/2021 10:30 A M Medical Record Number: [id number] Patient Account Number: [id number] Date of Birth/Sex: Treating RN: 11/05/1935 (86 y.o. [name] Primary Care Jabier Deese: Haynes Hoehn Other Clinician: Referring Kalei Meda: Treating Denorris Reust/Extender: Mellody Drown in Treatment: 4 Vital Signs Height(in): 32 Pulse(bpm): 43 Weight(lbs):  274.4 Blood Pressure(mmHg): 118/67 Body Mass Index(BMI): 35.2 Temperature(F): 97.9 Respiratory Rate(breaths/min): 18 Photos: [N/A:N/A] Right, Lateral Lower Leg N/A N/A Wound Location: Gradually Appeared N/A N/A Wounding Event: Venous Leg Ulcer N/A N/A Primary Etiology: Cataracts, Chronic Obstructive N/A N/A Comorbid History: Pulmonary Disease (COPD), Coronary Artery Disease, Deep Vein Thrombosis, Hypertension, Peripheral Venous Disease, Osteoarthritis, Received Chemotherapy, Received Radiation 09/01/2021 N/A N/A Date Acquired: 4 N/A N/A Weeks of Treatment: Healed - Epithelialized N/A N/A Wound  Status: No N/A N/A Wound Recurrence: Yes N/A N/A Clustered Wound: 4 N/A N/A Clustered Quantity: 0x0x0 N/A N/A Measurements L x W x D (cm) 0 N/A N/A A (cm) : rea 0 N/A N/A Volume (cm) : 100.00% N/A N/A % Reduction in Area: 100.00% N/A N/A % Reduction in Volume: Full Thickness Without Exposed N/A N/A Classification: Support Structures None Present N/A N/A Exudate Amount: None Present (0%) N/A N/A Granulation Amount: None Present (0%) N/A N/A Necrotic Amount: Fascia: No N/A N/A Exposed Structures: Fat Layer (Subcutaneous Tissue): No Tendon: No Muscle: No Joint: No Bone: No Large (67-100%) N/A N/A Epithelialization: Treatment Notes Electronic Signature(s) Signed: 10/06/2021 11:01:34 AM By: [name] MD FACS Signed: 10/07/2021 6:08:33 PM By: Baruch [name] RN, BSN Entered By: [name] on 10/06/2021 11:01:34 -------------------------------------------------------------------------------- Multi-Disciplinary Care Plan Details Patient Name: Date of Service: [name], Demon G. 10/06/2021 10:30 A M Medical Record Number: [id number] Patient Account Number: [id number] Date of Birth/Sex: Treating RN: August 13, 1935 (86 y.o. [name] Primary Care Sinclair Alligood: Haynes Hoehn Other Clinician: Referring Haylie Mccutcheon: Treating Camani Sesay/Extender: Mellody Drown in Treatment: 4 Multidisciplinary Care Plan reviewed with physician Active Inactive Electronic Signature(s) Signed: 10/07/2021 6:08:33 PM By: Baruch [name] RN, BSN Entered By: Baruch [name] on 10/06/2021 10:56:26 -------------------------------------------------------------------------------- Pain Assessment Details Patient Name: Date of Service: [name], Melissa G. 10/06/2021 10:30 A M Medical Record Number: [id number] Patient Account Number: [id number] Date of Birth/Sex: Treating RN: Jun 11, 1935 (86 y.o. [name] Primary Care Darrelle Wiberg: Haynes Hoehn Other Clinician: Referring Daysi Boggan: Treating Kleo Dungee/Extender: Mellody Drown in Treatment: 4 Active Problems Location of Pain Severity and Description of Pain Patient Has Paino No Site Locations Rate the pain. Current Pain Level: 0 Pain Management and Medication Current Pain Management: Electronic Signature(s) Signed: 10/07/2021 6:08:33 PM By: Baruch [name] RN, BSN Entered By: Baruch [name] on 10/06/2021 10:45:57 -------------------------------------------------------------------------------- Patient/Caregiver Education Details Patient Name: Date of Service: [name], Tom G. 8/28/2023andnbsp10:30 A M Medical Record Number: [id number] Patient Account Number: [id number] Date of Birth/Gender: Treating RN: 11/19/1935 (86 y.o. [name] Primary Care Physician: Haynes Hoehn Other Clinician: Referring Physician: Treating Physician/Extender: Mellody Drown in Treatment: 4 Education Assessment Education Provided To: Patient Education Topics Provided Venous: Methods: Explain/Verbal Responses: Reinforcements needed, State content correctly Electronic Signature(s) Signed: 10/07/2021 6:08:33 PM By: Baruch [name] RN, BSN Entered By: Baruch [name] on 10/06/2021  10:56:57 -------------------------------------------------------------------------------- Wound Assessment Details Patient Name: Date of Service: [name], Keilyn G. 10/06/2021 10:30 A M Medical Record Number: [id number] Patient Account Number: [id number] Date of Birth/Sex: Treating RN: 1935/04/16 (86 y.o. [name] Primary Care Loran Fleet: Haynes Hoehn Other Clinician: Referring Roger Fasnacht: Treating Suraya Vidrine/Extender: Mellody Drown in Treatment: 4 Wound Status Wound Number: [id number] Primary Venous Leg Ulcer Etiology: Wound Location: Right, Lateral Lower Leg Wound Healed - Epithelialized Wounding Event: Gradually Appeared Status: Date Acquired: 09/01/2021 Comorbid Cataracts, Chronic Obstructive Pulmonary Disease (COPD), Weeks Of Treatment: 4 History: Coronary Artery Disease, Deep Vein Thrombosis, Hypertension, Clustered Wound: Yes Peripheral Venous Disease, Osteoarthritis, Received Chemotherapy, Received Radiation Photos Wound Measurements Length: (cm) Width: (cm) Depth: (cm) Clustered Quantity: Area: (cm) Volume: (cm) 0 % Reduction in Area: 100% 0 % Reduction in Volume: 100% 0 Epithelialization: Large (67-100%) 4 Tunneling: No 0 Undermining: No 0 Wound Description Classification: Full Thickness Without Exposed Support Structures Exudate Amount: None Present Foul Odor After Cleansing: No Slough/Fibrino No Wound Bed Granulation Amount: None Present (0%) Exposed Structure Necrotic Amount: None Present (0%) Fascia Exposed: No  Fat Layer (Subcutaneous Tissue) Exposed: No Tendon Exposed: No Muscle Exposed: No Joint Exposed: No Bone Exposed: No Electronic Signature(s) Signed: 10/07/2021 6:08:33 PM By: Baruch [name] RN, BSN Entered By: Baruch [name] on 10/06/2021 10:54:48 -------------------------------------------------------------------------------- [place] Details Patient Name: Date of Service: [name], Brit G. 10/06/2021 10:30 A M Medical  Record Number: [id number] Patient Account Number: [id number] Date of Birth/Sex: Treating RN: [birth year] (86 y.o. [name] Primary Care Nyazia Canevari: Haynes Hoehn Other Clinician: Referring Donyale Falcon: Treating Giuliana Handyside/Extender: Mellody Drown in Treatment: 4 Vital Signs Time Taken: 10:43 Temperature (F): 97.9 Height (in): 74 Pulse (bpm): 57 Weight (lbs): 274.4 Respiratory Rate (breaths/min): 18 Body Mass Index (BMI): 35.2 Blood Pressure (mmHg): 118/67 Reference Range: 80 - 120 mg / dl Electronic Signature(s) Signed: 10/07/2021 6:08:33 PM By: Baruch [name] RN, BSN Entered By: Baruch [name] on 10/06/2021 10:45:49

## 2021-10-07 NOTE — Progress Notes (Signed)
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 has a superficial open area in the left medial ankle/calcaneus. I think this is secondary to chronic venous stasis dermatitis. He has nothing open on the right leg. They have been using his Farrow wrap on the left leg and still compression on the right. We will transfer him into his own external compression garments on the right leg as well. We talked about elevating his legs when he is sitting. 3/9; the patient has a superficial open area on the left medial ankle however it is expanded this week. He does not have a good edema control. They have been using a Farrow wrap on the right leg we allowed them to use a Farrow wrap on the left leg last week. The edema control in the left leg is not very good. 3/16; the only thing left here is the superficial irritated area on the left medial calcaneus. This came about I think because of transitioning him to [facility] to his compression garments on  the left. He is using a compression garment on the right. We still do not have wonderful edema control in this area 3/30; patient's area on the left medial calcaneus is closed and epithelialized. Still looks somewhat irritated perhaps chronic venous insufficiency. The patient has his Farrow wraps bilaterally. this was a very complicated patient who has a history of chronic venous insufficiency with lymphedema and wounds related to this. He was admitted to hospital with what was felt to be cellulitis perhaps with necrotic damage to his lower extremities bilaterally. On arrival to the clinic he had bilateral necrotic wounds which were fairly extensive in size and number. I really felt he probably had  an alternative explanation for these either microvascular disease related to peripheral emboli or some other disease or perhaps macrovascular disease. I had him seen by Dr. Donzetta Matters. He was worked up with I believe DVT rule out studies which paradoxically did show a DVTin the right femoral vein. He was put on anticoagulants which she is now finished. He asks whether he needs to continue these. I really didn't have a good answer for him I think not as he appears to been on this for 5 months now unless there is something else that I don't know. The patient also had an angiogram which showed some degree of arterial disease but no significant stenosis. He did not have an arterial procedure In any event always felt that we didn't exactly explain this man's presentation. I have no doubt he has lymphedema chronic venous disease but the pattern is bilateral extensive wounds really in my mind was not compatible with this. Nevertheless his wounds are now healed 4/13; we discharge this patient 2 weeks ago. He has a history of chronic venous insufficiency and lymphedema with severe bilateral necrotic wounds that were felt secondary to cellulitis in his lower extremities. It took a long period of time to get all of this to  close. His wife called urgently last week to report a rash on his anterior lower extremities bilaterally. We are only able to get him in today. His wife showed me pictures on the phone. Apparently he had been sitting in the sun for perhaps 2 hours but he had his compression stockings on. He developed a superficial erythematous rash with what look like macules on the right leg more superiorly. This was not painful. His wife states that she had been using a different type of soap on his lower extremities [Dial}. Wonders if this could have been some form of contact dermatitis. The rash is faded and his legs look back to normal. READMISSION 11/21/2019 This is a patient we had for several months at the end of 2020 into the beginning of this year. He had bilateral wounds on his lower legs in the setting of chronic venous insufficiency and lymphedema. We discharged him with [place] Char wraps stockings that he is using religiously. According to his wife everything was fine until the beginning of September he developed 2 blisters on the left medial ankle area. These open into wounds. He saw his primary doctor a culture was done of the area that showed heavy growth of methicillin sensitive staph aureus. He has completed doxycycline. They came in with simply the wraps on no additional dressings. Past medical history includes chronic venous insufficiency with lymphedema DVT of the right femoral vein I think this is remote, COPD, coronary artery disease, history of colon CA treated with surgery and radiation and skin cancer ABI in our clinic was 1.02 on the left 10/19; patient has 2 wounds on his left medial heel/ankle. These may have been infectious in etiology. He does have chronic venous insufficiency with lymphedema. We use silver collagen under compression, change this today to Iodoflex His wife brings in some lab work today from 9/29. This showed a normal comprehensive metabolic panel other than a slightly  elevated BUN at 23. White count at 8.74 hemoglobin at 13.4 platelet count normal at 310 11/2; the wound areas has morphed into when he left medial heel and ankle. Most of this is fully epithelialized. Surface debris. He has good edema control. He wears a Farrow wrap on the right leg he has 1 for  the left leg 11/16; left medial ankle and heel are both healed. Good edema control. He has a Farrow wrap for the right leg and one in waiting for the left leg that he can start using now READMISSION 09/08/2021 The patient returns to clinic today after just short of 2 years. He has been wearing his Farrow compression stockings religiously. About 10 days ago, his wife was washing his legs after removing his stockings and noticed a wound on his right lateral lower extremity. He thinks perhaps he snagged it on his wheelchair when being weighed at the pulmonologist office. They have been applying Neosporin and silver alginate that was left over from his previous admission. He denies any fevers or chills. He does not have any pain. The wounds are geographic and typical venous ulcers in appearance. There is slough on all of the wound surfaces. No erythema, induration, or purulent drainage. 09/15/2021: All of the wounds are smaller today and quite a bit cleaner. There is just a light layer of slough/biofilm and a bit of eschar on the surfaces. Periwound skin is in good condition. Edema control is excellent. We are using Iodoflex and 4-layer compression. 09/22/2021: He is down to 2 small wounds that are very superficial and quite clean. No slough accumulation on either site. Edema control is excellent. 09/29/2021: There is just 1 wound remaining. It is superficial and has a light layer of eschar on the surface. Good edema control. 10/06/2021: The wound is healed. Edema control is excellent. Patient History Unable to Obtain Patient History due to Altered Mental Status. Information obtained from Patient. Family  History No family history of Cancer, Diabetes, Heart Disease, Hereditary Spherocytosis, Hypertension, Kidney Disease, Lung Disease, Seizures, Stroke, Thyroid Problems, Tuberculosis. Social History Former smoker, Marital Status - Married, Alcohol Use - Rarely, Drug Use - No History, Caffeine Use - Daily. Medical History Eyes Patient has history of Cataracts - bil removed Denies history of Glaucoma, Optic Neuritis Ear/Nose/Mouth/Throat Denies history of Chronic sinus problems/congestion, Middle ear problems Hematologic/Lymphatic Denies history of Anemia, Hemophilia, Human Immunodeficiency Virus, Lymphedema, Sickle Cell Disease Respiratory Patient has history of Chronic Obstructive Pulmonary Disease (COPD) Denies history of Aspiration, Asthma, Pneumothorax, Sleep Apnea, Tuberculosis Cardiovascular Patient has history of Coronary Artery Disease, Deep Vein Thrombosis, Hypertension, Peripheral Venous Disease Denies history of Angina, Arrhythmia, Congestive Heart Failure, Hypotension, Myocardial Infarction, Peripheral Arterial Disease, Phlebitis, Vasculitis Gastrointestinal Denies history of Cirrhosis , Colitis, Crohnoos, Hepatitis A, Hepatitis B, Hepatitis C Endocrine Denies history of Type I Diabetes, Type II Diabetes Genitourinary Denies history of End Stage Renal Disease Immunological Denies history of Lupus Erythematosus, Raynaudoos, Scleroderma Integumentary (Skin) Denies history of History of Burn Musculoskeletal Patient has history of Osteoarthritis Denies history of Gout, Rheumatoid Arthritis, Osteomyelitis Neurologic Denies history of Dementia, Neuropathy, Quadriplegia, Paraplegia, Seizure Disorder Oncologic Patient has history of Received Chemotherapy - 2015, Received Radiation - 2015 Psychiatric Denies history of Anorexia/bulimia, Confinement Anxiety Hospitalization/Surgery History - colon resection. - umbilical hernia repair. Medical A Surgical History  Notes nd Genitourinary enlarged prostate Integumentary (Skin) petechial rash Oncologic hx colon CA Objective Constitutional Slightly bradycardic, asymptomatic.Marland Kitchen No acute distress.. Vitals Time Taken: 10:43 AM, Height: 74 in, Weight: 274.4 lbs, BMI: 35.2, Temperature: 97.9 F, Pulse: 57 bpm, Respiratory Rate: 18 breaths/min, Blood Pressure: 118/67 mmHg. Respiratory Normal work of breathing on room air.. General Notes: 10/06/2021: The wound is healed. Edema control is excellent. Integumentary (Hair, Skin) Wound #8 status is Healed - Epithelialized. Original cause of wound was Gradually Appeared. The date acquired was:  09/01/2021. The wound has been in treatment 4 weeks. The wound is located on the Right,Lateral Lower Leg. The wound measures 0cm length x 0cm width x 0cm depth; 0cm^2 area and 0cm^3 volume. There is no tunneling or undermining noted. There is a none present amount of drainage noted. There is no granulation within the wound bed. There is no necrotic tissue within the wound bed. Assessment Active Problems ICD-10 Non-pressure chronic ulcer of other part of right lower leg with fat layer exposed Atherosclerotic heart disease of native coronary artery without angina pectoris Essential (primary) hypertension Varicose veins of unspecified lower extremity with ulcer of unspecified site Other specified disorders of veins Other nonthrombocytopenic purpura Unspecified severe protein-calorie malnutrition Venous insufficiency (chronic) (peripheral) Plan Discharge From [facility] Services: Discharge from [facility]ing/ Shower/ Hygiene: May shower and wash wound with soap and water. Edema Control - Lymphedema / SCD / Other: Elevate legs to the level of the heart or above for 30 minutes daily and/or when sitting, a frequency of: - throughout the day Exercise regularly Compression stocking or Garment 20-30 mm/Hg pressure to: - juxtalites both legs daily 10/06/2021: The wound is  healed. Edema control is excellent. He has juxta lite stockings and was instructed to wear them religiously. He should also assiduously elevate his legs is much as possible during the day and at night. We will discharge him from the wound care center and see him on an as-needed basis. Electronic Signature(s) Signed: 10/06/2021 11:04:47 AM By: [name] MD FACS Entered By: [name] on 10/06/2021 11:04:46 -------------------------------------------------------------------------------- HxROS Details Patient Name: Date of Service: [name], [name] 10/06/2021 10:30 A M Medical Record Number: [id number] Patient Account Number: [id number] Date of Birth/Sex: Treating RN: [birth year] (86 y.o. [name] Primary Care Provider: Haynes Hoehn Other Clinician: Referring Provider: Treating Provider/Extender: Mellody Drown in Treatment: 4 Unable to Obtain Patient History due to Altered Mental Status Information Obtained From Patient Eyes Medical History: Positive for: Cataracts - bil removed Negative for: Glaucoma; Optic Neuritis Ear/Nose/Mouth/Throat Medical History: Negative for: Chronic sinus problems/congestion; Middle ear problems Hematologic/Lymphatic Medical History: Negative for: Anemia; Hemophilia; Human Immunodeficiency Virus; Lymphedema; Sickle Cell Disease Respiratory Medical History: Positive for: Chronic Obstructive Pulmonary Disease (COPD) Negative for: Aspiration; Asthma; Pneumothorax; Sleep Apnea; Tuberculosis Cardiovascular Medical History: Positive for: Coronary Artery Disease; Deep Vein Thrombosis; Hypertension; Peripheral Venous Disease Negative for: Angina; Arrhythmia; Congestive Heart Failure; Hypotension; Myocardial Infarction; Peripheral Arterial Disease; Phlebitis; Vasculitis Gastrointestinal Medical History: Negative for: Cirrhosis ; Colitis; Crohns; Hepatitis A; Hepatitis B; Hepatitis C Endocrine Medical  History: Negative for: Type I Diabetes; Type II Diabetes Genitourinary Medical History: Negative for: End Stage Renal Disease Past Medical History Notes: enlarged prostate Immunological Medical History: Negative for: Lupus Erythematosus; Raynauds; Scleroderma Integumentary (Skin) Medical History: Negative for: History of Burn Past Medical History Notes: petechial rash Musculoskeletal Medical History: Positive for: Osteoarthritis Negative for: Gout; Rheumatoid Arthritis; Osteomyelitis Neurologic Medical History: Negative for: Dementia; Neuropathy; Quadriplegia; Paraplegia; Seizure Disorder Oncologic Medical History: Positive for: Received Chemotherapy - 2015; Received Radiation - 2015 Past Medical History Notes: hx colon CA Psychiatric Medical History: Negative for: Anorexia/bulimia; Confinement Anxiety HBO Extended History Items Eyes: Cataracts Immunizations Pneumococcal Vaccine: Received Pneumococcal Vaccination: Yes Received Pneumococcal Vaccination On or After 60th Birthday: Yes Implantable Devices None Hospitalization / Surgery History Type of Hospitalization/Surgery colon resection umbilical hernia repair Family and Social History Cancer: No; Diabetes: No; Heart Disease: No; Hereditary Spherocytosis: No; Hypertension: No; Kidney Disease: No; Lung Disease: No; Seizures: No;  Stroke: No; Thyroid Problems: No; Tuberculosis: No; Former smoker; Marital Status - Married; Alcohol Use: Rarely; Drug Use: No History; Caffeine Use: Daily; Financial Concerns: No; Food, Clothing or Shelter Needs: No; Support System Lacking: No; Transportation Concerns: No Electronic Signature(s) Signed: 10/06/2021 11:11:40 AM By: [name] MD FACS Signed: 10/07/2021 6:08:33 PM By: Baruch [name] RN, BSN Entered By: [name] on 10/06/2021 11:03:44 -------------------------------------------------------------------------------- SuperBill Details Patient Name: Date of  Service: [name], [name] 10/06/2021 Medical Record Number: [id number] Patient Account Number: [id number] Date of Birth/Sex: Treating RN: [birth year] (86 y.o. [name] Primary Care Provider: Haynes Hoehn Other Clinician: Referring Provider: Treating Provider/Extender: Mellody Drown in Treatment: 4 Diagnosis Coding ICD-10 Codes Code Description [phone] Non-pressure chronic ulcer of other part of right lower leg with fat layer exposed I25.10 Atherosclerotic heart disease of native coronary artery without angina pectoris I10 Essential (primary) hypertension I83.009 Varicose veins of unspecified lower extremity with ulcer of unspecified site I87.8 Other specified disorders of veins D69.2 Other nonthrombocytopenic purpura E43 Unspecified severe protein-calorie malnutrition I87.2 Venous insufficiency (chronic) (peripheral) Facility Procedures CPT4 Code: [id number] Description: 99213 - WOUND CARE VISIT-LEV 3 EST PT Modifier: Quantity: 1 Physician Procedures : CPT4 Code Description Modifier [id number] - WC PHYS LEVEL 3 - EST PT ICD-10 Diagnosis Description W80.881 Non-pressure chronic ulcer of other part of right lower leg with fat layer exposed I83.009 Varicose veins of unspecified lower extremity with  ulcer of unspecified site I87.2 Venous insufficiency (chronic) (peripheral) I87.8 Other specified disorders of veins Quantity: 1 Electronic Signature(s) Signed: 10/06/2021 11:11:40 AM By: [name] MD FACS Signed: 10/07/2021 6:08:33 PM By: Baruch [name] RN, BSN Previous Signature: 10/06/2021 11:05:05 AM Version By: [name] MD FACS Entered By: Baruch [name] on 10/06/2021 11:07:16

## 2021-10-14 ENCOUNTER — Encounter (HOSPITAL_BASED_OUTPATIENT_CLINIC_OR_DEPARTMENT_OTHER): Payer: Medicare Other | Admitting: General Surgery

## 2021-11-06 ENCOUNTER — Encounter (HOSPITAL_BASED_OUTPATIENT_CLINIC_OR_DEPARTMENT_OTHER): Payer: Medicare Other | Attending: General Surgery | Admitting: General Surgery

## 2021-11-06 DIAGNOSIS — M1990 Unspecified osteoarthritis, unspecified site: Secondary | ICD-10-CM | POA: Insufficient documentation

## 2021-11-06 DIAGNOSIS — I82411 Acute embolism and thrombosis of right femoral vein: Secondary | ICD-10-CM | POA: Diagnosis not present

## 2021-11-06 DIAGNOSIS — Z85828 Personal history of other malignant neoplasm of skin: Secondary | ICD-10-CM | POA: Diagnosis not present

## 2021-11-06 DIAGNOSIS — I2510 Atherosclerotic heart disease of native coronary artery without angina pectoris: Secondary | ICD-10-CM | POA: Insufficient documentation

## 2021-11-06 DIAGNOSIS — J449 Chronic obstructive pulmonary disease, unspecified: Secondary | ICD-10-CM | POA: Diagnosis not present

## 2021-11-06 DIAGNOSIS — L97511 Non-pressure chronic ulcer of other part of right foot limited to breakdown of skin: Secondary | ICD-10-CM | POA: Insufficient documentation

## 2021-11-06 DIAGNOSIS — Z923 Personal history of irradiation: Secondary | ICD-10-CM | POA: Diagnosis not present

## 2021-11-06 DIAGNOSIS — I872 Venous insufficiency (chronic) (peripheral): Secondary | ICD-10-CM | POA: Insufficient documentation

## 2021-11-06 DIAGNOSIS — I890 Lymphedema, not elsewhere classified: Secondary | ICD-10-CM | POA: Insufficient documentation

## 2021-11-06 DIAGNOSIS — Z86718 Personal history of other venous thrombosis and embolism: Secondary | ICD-10-CM | POA: Diagnosis not present

## 2021-11-06 NOTE — Progress Notes (Signed)
[name], [name] ([id number]) Visit Report for 11/06/2021 Allergy List Details Patient Name: Date of Service: [name], [name] 11/06/2021 9:00 A M Medical Record Number: [id number] Patient Account Number: [id number] Date of Birth/Sex: Treating RN: 1935-10-10 (86 y.o. [name] Primary Care Nomie Buchberger: Haynes Hoehn Other Clinician: Referring Nakari Bracknell: Treating Rica Heather/Extender: Mellody Drown in Treatment: 0 Allergies Active Allergies Sulfa (Sulfonamide Antibiotics) Allergy Notes Electronic Signature(s) Signed: 11/06/2021 5:24:26 PM By: Baruch [name] RN, BSN Entered By: Baruch [name] on 11/06/2021 09:44:31 -------------------------------------------------------------------------------- Arrival Information Details Patient Name: Date of Service: [name], [name] 11/06/2021 9:00 A M Medical Record Number: [id number] Patient Account Number: [id number] Date of Birth/Sex: Treating RN: 05/15/1935 (86 y.o. [name] Primary Care Orlandria Kissner: Haynes Hoehn Other Clinician: Referring Cyani Kallstrom: Treating Angelene Rome/Extender: Mellody Drown in Treatment: 0 Visit Information Patient Arrived: Wheel Chair Arrival Time: 09:42 Accompanied By: spouse Transfer Assistance: None Patient Identification Verified: Yes Secondary Verification Process Completed: Yes Patient Requires Transmission-Based Precautions: No Patient Has Alerts: Yes Patient Alerts: R ABI = 1.0 History Since Last Visit Added or deleted any medications: No Any new allergies or adverse reactions: No Had a fall or experienced change in activities of daily living that may affect risk of falls: No Signs or symptoms of abuse/neglect since last visito No Hospitalized since last visit: No Implantable device outside of the clinic excluding cellular tissue based products placed in the center since last visit: No Has Dressing in Place as Prescribed: Yes Has Compression in Place as  Prescribed: Yes Electronic Signature(s) Signed: 11/06/2021 5:24:26 PM By: Baruch [name] RN, BSN Entered By: Baruch [name] on 11/06/2021 10:00:50 -------------------------------------------------------------------------------- Clinic Level of Care Assessment Details Patient Name: Date of Service: [name], [name] 11/06/2021 9:00 A M Medical Record Number: [id number] Patient Account Number: [id number] Date of Birth/Sex: Treating RN: [birth year] (86 y.o. [name] Primary Care Bertrice Leder: Haynes Hoehn Other Clinician: Referring Hershall Benkert: Treating Rebekka Lobello/Extender: Mellody Drown in Treatment: 0 Clinic Level of Care Assessment Items TOOL 2 Quantity Score '[]'$  - 0 Use when only an EandM is performed on the INITIAL visit ASSESSMENTS - Nursing Assessment / Reassessment X- 1 20 General Physical Exam (combine w/ comprehensive assessment (listed just below) when performed on new pt. evals) X- 1 25 Comprehensive Assessment (HX, ROS, Risk Assessments, Wounds Hx, etc.) ASSESSMENTS - Wound and Skin A ssessment / Reassessment X - Simple Wound Assessment / Reassessment - one wound 1 5 '[]'$  - 0 Complex Wound Assessment / Reassessment - multiple wounds X- 1 10 Dermatologic / Skin Assessment (not related to wound area) ASSESSMENTS - Ostomy and/or Continence Assessment and Care '[]'$  - 0 Incontinence Assessment and Management '[]'$  - 0 Ostomy Care Assessment and Management (repouching, etc.) PROCESS - Coordination of Care X - Simple Patient / Family Education for ongoing care 1 15 '[]'$  - 0 Complex (extensive) Patient / Family Education for ongoing care X- 1 10 Staff obtains Programmer, systems, Records, T Results / Process Orders est '[]'$  - 0 Staff telephones HHA, Nursing Homes / Clarify orders / etc '[]'$  - 0 Routine Transfer to another Facility (non-emergent condition) '[]'$  - 0 Routine Hospital Admission (non-emergent condition) X- 1 15 New Admissions / Biomedical engineer /  Ordering NPWT Apligraf, etc. , '[]'$  - 0 Emergency Hospital Admission (emergent condition) X- 1 10 Simple Discharge Coordination '[]'$  - 0 Complex (extensive) Discharge Coordination PROCESS - Special Needs '[]'$  - 0 Pediatric / Minor Patient Management '[]'$  - 0 Isolation Patient Management '[]'$  -  0 Hearing / Language / Visual special needs '[]'$  - 0 Assessment of Community assistance (transportation, D/C planning, etc.) '[]'$  - 0 Additional assistance / Altered mentation '[]'$  - 0 Support Surface(s) Assessment (bed, cushion, seat, etc.) INTERVENTIONS - Wound Cleansing / Measurement X- 1 5 Wound Imaging (photographs - any number of wounds) '[]'$  - 0 Wound Tracing (instead of photographs) X- 1 5 Simple Wound Measurement - one wound '[]'$  - 0 Complex Wound Measurement - multiple wounds X- 1 5 Simple Wound Cleansing - one wound '[]'$  - 0 Complex Wound Cleansing - multiple wounds INTERVENTIONS - Wound Dressings X - Small Wound Dressing one or multiple wounds 1 10 '[]'$  - 0 Medium Wound Dressing one or multiple wounds '[]'$  - 0 Large Wound Dressing one or multiple wounds '[]'$  - 0 Application of Medications - injection INTERVENTIONS - Miscellaneous '[]'$  - 0 External ear exam '[]'$  - 0 Specimen Collection (cultures, biopsies, blood, body fluids, etc.) '[]'$  - 0 Specimen(s) / Culture(s) sent or taken to Lab for analysis '[]'$  - 0 Patient Transfer (multiple staff / Civil Service fast streamer / Similar devices) '[]'$  - 0 Simple Staple / Suture removal (25 or less) '[]'$  - 0 Complex Staple / Suture removal (26 or more) '[]'$  - 0 Hypo / Hyperglycemic Management (close monitor of Blood Glucose) '[]'$  - 0 Ankle / Brachial Index (ABI) - do not check if billed separately Has the patient been seen at the hospital within the last three years: Yes Total Score: 135 Level Of Care: New/Established - Level 4 Electronic Signature(s) Signed: 11/06/2021 5:24:26 PM By: Baruch [name] RN, BSN Entered By: Baruch [name] on 11/06/2021  10:32:22 -------------------------------------------------------------------------------- Encounter Discharge Information Details Patient Name: Date of Service: [name], [name] 11/06/2021 9:00 A M Medical Record Number: [id number] Patient Account Number: [id number] Date of Birth/Sex: Treating RN: Mar 12, 1935 (86 y.o. [name] Primary Care Emilianna Barlowe: Haynes Hoehn Other Clinician: Referring Lenix Kidd: Treating Dayshaun Whobrey/Extender: Mellody Drown in Treatment: 0 Encounter Discharge Information Items Discharge Condition: Stable Ambulatory Status: Wheelchair Discharge Destination: Home Transportation: Private Auto Accompanied By: spouse Schedule Follow-up Appointment: Yes Clinical Summary of Care: Patient Declined Electronic Signature(s) Signed: 11/06/2021 5:24:26 PM By: Baruch [name] RN, BSN Entered By: Baruch [name] on 11/06/2021 10:32:57 -------------------------------------------------------------------------------- Lower Extremity Assessment Details Patient Name: Date of Service: [name], [name] 11/06/2021 9:00 A M Medical Record Number: [id number] Patient Account Number: [id number] Date of Birth/Sex: Treating RN: Sep 19, 1935 (86 y.o. [name] Primary Care Laynie Espy: Haynes Hoehn Other Clinician: Referring Lylah Lantis: Treating Tammye Kahler/Extender: Mellody Drown in Treatment: 0 Edema Assessment Assessed: [name]: No] [Right: No] Edema: [Left: Ye] [Right: s] Calf Left: Right: Point of Measurement: From Medial Instep 39 cm Ankle Left: Right: Point of Measurement: From Medial Instep 25 cm Vascular Assessment Pulses: Dorsalis Pedis Palpable: [Right:Yes] Electronic Signature(s) Signed: 11/06/2021 5:24:26 PM By: Baruch [name] RN, BSN Entered By: Baruch [name] on 11/06/2021 09:52:43 -------------------------------------------------------------------------------- Multi Wound Chart Details Patient Name: Date of  Service: [name], [name] 11/06/2021 9:00 A M Medical Record Number: [id number] Patient Account Number: [id number] Date of Birth/Sex: Treating RN: [birth year] (86 y.o. M) Primary Care Preethi Scantlebury: Haynes Hoehn Other Clinician: Referring Kiarrah Rausch: Treating Santoria Chason/Extender: Mellody Drown in Treatment: 0 Vital Signs Height(in): 74 Pulse(bpm): 39 Weight(lbs): 270 Blood Pressure(mmHg): 147/80 Body Mass Index(BMI): 34.7 Temperature(F): 97.5 Respiratory Rate(breaths/min): 20 Photos: [N/A:N/A] Right T - Web between 1st and 2nd N/A oe N/A Wound Location: Blister N/A N/A Wounding Event: Fungal N/A N/A Primary Etiology: Lymphedema N/A N/A  Secondary Etiology: Cataracts, Chronic Obstructive N/A N/A Comorbid History: Pulmonary Disease (COPD), Coronary Artery Disease, Deep Vein Thrombosis, Hypertension, Peripheral Venous Disease, Osteoarthritis, Received Chemotherapy, Received Radiation 10/31/2021 N/A N/A Date Acquired: 0 N/A N/A Weeks of Treatment: Open N/A N/A Wound Status: No N/A N/A Wound Recurrence: 0.8x1.9x0.1 N/A N/A Measurements L x W x D (cm) 1.194 N/A N/A A (cm) : rea 0.119 N/A N/A Volume (cm) : Partial Thickness N/A N/A Classification: Medium N/A N/A Exudate A mount: Serous N/A N/A Exudate Type: amber N/A N/A Exudate Color: Indistinct, nonvisible N/A N/A Wound Margin: Small (1-33%) N/A N/A Granulation A mount: Pink N/A N/A Granulation Quality: None Present (0%) N/A N/A Necrotic A mount: Fascia: No N/A N/A Exposed Structures: Fat Layer (Subcutaneous Tissue): No Tendon: No Muscle: No Joint: No Bone: No Limited to Skin Breakdown Large (67-100%) N/A N/A Epithelialization: Treatment Notes Electronic Signature(s) Signed: 11/06/2021 10:19:57 AM By: [name] MD FACS Entered By: [name] on 11/06/2021 10:19:57 -------------------------------------------------------------------------------- Multi-Disciplinary Care  Plan Details Patient Name: Date of Service: [name], [name] 11/06/2021 9:00 A M Medical Record Number: [id number] Patient Account Number: [id number] Date of Birth/Sex: Treating RN: 18-Jul-1935 (86 y.o. [name] Primary Care Lourene Hoston: Haynes Hoehn Other Clinician: Referring Stefanie Hodgens: Treating Christia Coaxum/Extender: Mellody Drown in Treatment: Gentry reviewed with physician Active Inactive Wound/Skin Impairment Nursing Diagnoses: Impaired tissue integrity Knowledge deficit related to ulceration/compromised skin integrity Goals: Patient/caregiver will verbalize understanding of skin care regimen Date Initiated: 11/06/2021 Target Resolution Date: 12/04/2021 Goal Status: Active Ulcer/skin breakdown will have a volume reduction of 30% by week 4 Date Initiated: 11/06/2021 Target Resolution Date: 12/04/2021 Goal Status: Active Interventions: Assess patient/caregiver ability to obtain necessary supplies Assess patient/caregiver ability to perform ulcer/skin care regimen upon admission and as needed Assess ulceration(s) every visit Provide education on ulcer and skin care Treatment Activities: Skin care regimen initiated : 11/06/2021 Topical wound management initiated : 11/06/2021 Notes: Electronic Signature(s) Signed: 11/06/2021 5:24:26 PM By: Baruch [name] RN, BSN Entered By: Baruch [name] on 11/06/2021 10:31:14 -------------------------------------------------------------------------------- Pain Assessment Details Patient Name: Date of Service: [name], [name] 11/06/2021 9:00 A M Medical Record Number: [id number] Patient Account Number: [id number] Date of Birth/Sex: Treating RN: February 22, 1935 (86 y.o. [name] Primary Care Klint Lezcano: Haynes Hoehn Other Clinician: Referring Mylena Sedberry: Treating Sophiarose Eades/Extender: Mellody Drown in Treatment: 0 Active Problems Location of Pain Severity and Description  of Pain Patient Has Paino No Site Locations Rate the pain. Current Pain Level: 0 Pain Management and Medication Current Pain Management: Electronic Signature(s) Signed: 11/06/2021 5:24:26 PM By: Baruch [name] RN, BSN Entered By: Baruch [name] on 11/06/2021 10:00:10 -------------------------------------------------------------------------------- Patient/Caregiver Education Details Patient Name: Date of Service: [name], [name] 9/28/2023andnbsp9:00 A M Medical Record Number: [id number] Patient Account Number: [id number] Date of Birth/Gender: Treating RN: 02-Mar-1935 (86 y.o. [name] Primary Care Physician: Haynes Hoehn Other Clinician: Referring Physician: Treating Physician/Extender: Mellody Drown in Treatment: 0 Education Assessment Education Provided To: Patient Education Topics Provided Wound/Skin Impairment: Methods: Explain/Verbal Responses: Reinforcements needed, State content correctly Electronic Signature(s) Signed: 11/06/2021 5:24:26 PM By: Baruch [name] RN, BSN Entered By: Baruch [name] on 11/06/2021 10:31:24 -------------------------------------------------------------------------------- Wound Assessment Details Patient Name: Date of Service: [name], [name] 11/06/2021 9:00 A M Medical Record Number: [id number] Patient Account Number: [id number] Date of Birth/Sex: Treating RN: Jan 31, 1936 (86 y.o. [name] Primary Care Adria Costley: Haynes Hoehn Other Clinician: Referring Morning Halberg: Treating Merlon Alcorta/Extender: Mellody Drown in Treatment:  0 Wound Status Wound Number: 9 Primary Fungal Etiology: Wound Location: Right T - Web between 1st and 2nd oe Secondary Lymphedema Wounding Event: Blister Etiology: Date Acquired: 10/31/2021 Wound Open Weeks Of Treatment: 0 Status: Clustered Wound: No Comorbid Cataracts, Chronic Obstructive Pulmonary Disease (COPD), History: Coronary Artery Disease, Deep  Vein Thrombosis, Hypertension, Peripheral Venous Disease, Osteoarthritis, Received Chemotherapy, Received Radiation Photos Wound Measurements Length: (cm) 0.8 Width: (cm) 1.9 Depth: (cm) 0.1 Area: (cm) 1.194 Volume: (cm) 0.119 % Reduction in Area: % Reduction in Volume: Epithelialization: Large (67-100%) Tunneling: No Undermining: No Wound Description Classification: Partial Thickness Wound Margin: Indistinct, nonvisible Exudate Amount: Medium Exudate Type: Serous Exudate Color: amber Foul Odor After Cleansing: No Slough/Fibrino No Wound Bed Granulation Amount: Small (1-33%) Exposed Structure Granulation Quality: Pink Fascia Exposed: No Necrotic Amount: None Present (0%) Fat Layer (Subcutaneous Tissue) Exposed: No Tendon Exposed: No Muscle Exposed: No Joint Exposed: No Bone Exposed: No Limited to Skin Breakdown Treatment Notes Wound #9 (Toe - Web between 1st and 2nd) Wound Laterality: Right Cleanser Peri-Wound Care Topical Nystop Nystatin Powder, 15 (g) bottle Discharge Instruction: antifungal powder to toes Primary Dressing Medline Woven Gauze Sponges 4x4 (in/in) Discharge Instruction: between the toes Secondary Dressing Secured With Compression Wrap Compression Stockings Add-Ons Electronic Signature(s) Signed: 11/06/2021 5:24:26 PM By: Baruch [name] RN, BSN Entered By: Baruch [name] on 11/06/2021 10:10:27 -------------------------------------------------------------------------------- Vitals Details Patient Name: Date of Service: [name], [name] G. 11/06/2021 9:00 A M Medical Record Number: [id number] Patient Account Number: [id number] Date of Birth/Sex: Treating RN: August 16, 1935 (86 y.o. [name] Primary Care Oretha Weismann: Haynes Hoehn Other Clinician: Referring Aisea Bouldin: Treating Endre Coutts/Extender: Mellody Drown in Treatment: 0 Vital Signs Time Taken: 09:43 Temperature (F): 97.5 Height (in): 74 Pulse (bpm):  74 Source: Stated Respiratory Rate (breaths/min): 20 Weight (lbs): 270 Blood Pressure (mmHg): 147/80 Body Mass Index (BMI): 34.7 Reference Range: 80 - 120 mg / dl Electronic Signature(s) Signed: 11/06/2021 5:24:26 PM By: Baruch [name] RN, BSN Entered By: Baruch [name] on 11/06/2021 09:44:25

## 2021-11-06 NOTE — Progress Notes (Signed)
[name], [name] ([id number]) Visit Report for 11/06/2021 Abuse Risk Screen Details Patient Name: Date of Service: [name], [name] 11/06/2021 9:00 A M Medical Record Number: [id number] Patient Account Number: [id number] Date of Birth/Sex: Treating RN: [birth year] (86 y.o. [name] Primary Care Keddrick Wyne: Haynes Hoehn Other Clinician: Referring Keedan Sample: Treating Radley Teston/Extender: Mellody Drown in Treatment: 0 Abuse Risk Screen Items Answer ABUSE RISK SCREEN: Has anyone close to you tried to hurt or harm you recentlyo No Do you feel uncomfortable with anyone in your familyo No Has anyone forced you do things that you didnt want to doo No Electronic Signature(s) Signed: 11/06/2021 5:24:26 PM By: Baruch [name] RN, BSN Entered By: Baruch [name] on 11/06/2021 09:47:54 -------------------------------------------------------------------------------- Activities of Daily Living Details Patient Name: Date of Service: [name], [name] 11/06/2021 9:00 A M Medical Record Number: [id number] Patient Account Number: [id number] Date of Birth/Sex: Treating RN: January 04, 1936 (86 y.o. [name] Primary Care Donie Moulton: Haynes Hoehn Other Clinician: Referring Hernan Turnage: Treating Vicki Chaffin/Extender: Mellody Drown in Treatment: 0 Activities of Daily Living Items Answer Activities of Daily Living (Please select one for each item) Drive Automobile Not Able T Medications ake Need Assistance Use T elephone Completely Able Care for Appearance Completely Able Use T oilet Completely Able Bath / Shower Completely Able Dress Self Completely Able Feed Self Completely Able Walk Not Able Get In / Out Bed Completely Able Housework Need Assistance Prepare Meals Need Assistance Handle Money Completely Able Shop for Self Need Assistance Electronic Signature(s) Signed: 11/06/2021 5:24:26 PM By: Baruch [name] RN, BSN Entered By: Baruch [name] on  11/06/2021 09:48:34 -------------------------------------------------------------------------------- Education Screening Details Patient Name: Date of Service: [name], [name] 11/06/2021 9:00 A M Medical Record Number: [id number] Patient Account Number: [id number] Date of Birth/Sex: Treating RN: Aug 24, 1935 (86 y.o. [name] Primary Care Hridhaan Yohn: Haynes Hoehn Other Clinician: Referring Shahd Occhipinti: Treating Kambra Beachem/Extender: Mellody Drown in Treatment: 0 Primary Learner Assessed: Patient Learning Preferences/Education Level/Primary Language Learning Preference: Explanation, Demonstration, Printed Material Highest Education Level: College or Above Preferred Language: English Cognitive Barrier Language Barrier: No Translator Needed: No Memory Deficit: No Emotional Barrier: No Cultural/Religious Beliefs Affecting Medical Care: No Physical Barrier Impaired Vision: Yes Glasses Impaired Hearing: No Decreased Hand dexterity: No Knowledge/Comprehension Knowledge Level: High Comprehension Level: High Ability to understand written instructions: High Ability to understand verbal instructions: High Motivation Anxiety Level: Calm Cooperation: Cooperative Education Importance: Acknowledges Need Interest in Health Problems: Asks Questions Perception: Coherent Willingness to Engage in Self-Management High Activities: Readiness to Engage in Self-Management High Activities: Electronic Signature(s) Signed: 11/06/2021 5:24:26 PM By: Baruch [name] RN, BSN Entered By: Baruch [name] on 11/06/2021 09:49:04 -------------------------------------------------------------------------------- Fall Risk Assessment Details Patient Name: Date of Service: [name], [name] 11/06/2021 9:00 A M Medical Record Number: [id number] Patient Account Number: [id number] Date of Birth/Sex: Treating RN: [birth year] (86 y.o. [name] Primary Care Samyra Limb: Haynes Hoehn Other Clinician: Referring Najae Filsaime: Treating Torin Modica/Extender: Mellody Drown in Treatment: 0 Fall Risk Assessment Items Have you had 2 or more falls in the last 12 monthso 0 No Have you had any fall that resulted in injury in the last 12 monthso 0 No FALLS RISK SCREEN History of falling - immediate or within 3 months 0 No Secondary diagnosis (Do you have 2 or more medical diagnoseso) 0 No Ambulatory aid None/bed rest/wheelchair/nurse 0 Yes Crutches/cane/walker 0 No Furniture 0 No Intravenous therapy Access/Saline/Heparin Lock 0 No Gait/Transferring Normal/ bed rest/ wheelchair  0 Yes Weak (short steps with or without shuffle, stooped but able to lift head while walking, may seek 0 No support from furniture) Impaired (short steps with shuffle, may have difficulty arising from chair, head down, impaired 0 No balance) Mental Status Oriented to own ability 0 Yes Electronic Signature(s) Signed: 11/06/2021 5:24:26 PM By: Baruch [name] RN, BSN Entered By: Baruch [name] on 11/06/2021 09:49:18 -------------------------------------------------------------------------------- Foot Assessment Details Patient Name: Date of Service: [name], [name] 11/06/2021 9:00 A M Medical Record Number: [id number] Patient Account Number: [id number] Date of Birth/Sex: Treating RN: [birth year] (86 y.o. [name] Primary Care Miesha Bachmann: Haynes Hoehn Other Clinician: Referring Becca Bayne: Treating Stefannie Defeo/Extender: Mellody Drown in Treatment: 0 Foot Assessment Items Site Locations + = Sensation present, - = Sensation absent, C = Callus, U = Ulcer R = Redness, W = Warmth, M = Maceration, PU = Pre-ulcerative lesion F = Fissure, S = Swelling, D = Dryness Assessment Right: Left: Other Deformity: No No Prior Foot Ulcer: No No Prior Amputation: No No Charcot Joint: No No Ambulatory Status: Ambulatory With Help Assistance Device: Walker Gait:  Administrator, arts) Signed: 11/06/2021 5:24:26 PM By: Baruch [name] RN, BSN Entered By: Baruch [name] on 11/06/2021 09:50:09 -------------------------------------------------------------------------------- Nutrition Risk Screening Details Patient Name: Date of Service: [name], [name] 11/06/2021 9:00 A M Medical Record Number: [id number] Patient Account Number: [id number] Date of Birth/Sex: Treating RN: [birth year] (86 y.o. [name] Primary Care Zane Samson: Haynes Hoehn Other Clinician: Referring Khloei Spiker: Treating Yonael Tulloch/Extender: Mellody Drown in Treatment: 0 Height (in): 74 Weight (lbs): 270 Body Mass Index (BMI): 34.7 Nutrition Risk Screening Items Score Screening NUTRITION RISK SCREEN: I have an illness or condition that made me change the kind and/or amount of food I eat 0 No I eat fewer than two meals per day 0 No I eat few fruits and vegetables, or milk products 0 No I have three or more drinks of beer, liquor or wine almost every day 0 No I have tooth or mouth problems that make it hard for me to eat 0 No I don't always have enough money to buy the food I need 0 No I eat alone most of the time 0 No I take three or more different prescribed or over-the-counter drugs a day 1 Yes Without wanting to, I have lost or gained 10 pounds in the last six months 0 No I am not always physically able to shop, cook and/or feed myself 0 No Nutrition Protocols Good Risk Protocol 0 No interventions needed Moderate Risk Protocol High Risk Proctocol Risk Level: Good Risk Score: 1 Electronic Signature(s) Signed: 11/06/2021 5:24:26 PM By: Baruch [name] RN, BSN Entered By: Baruch [name] on 11/06/2021 09:49:30
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
## 2021-11-13 ENCOUNTER — Encounter (HOSPITAL_BASED_OUTPATIENT_CLINIC_OR_DEPARTMENT_OTHER): Payer: Medicare Other | Attending: General Surgery | Admitting: General Surgery

## 2021-11-13 DIAGNOSIS — J449 Chronic obstructive pulmonary disease, unspecified: Secondary | ICD-10-CM | POA: Insufficient documentation

## 2021-11-13 DIAGNOSIS — I10 Essential (primary) hypertension: Secondary | ICD-10-CM | POA: Insufficient documentation

## 2021-11-13 DIAGNOSIS — L97511 Non-pressure chronic ulcer of other part of right foot limited to breakdown of skin: Secondary | ICD-10-CM | POA: Diagnosis present

## 2021-11-13 DIAGNOSIS — I2510 Atherosclerotic heart disease of native coronary artery without angina pectoris: Secondary | ICD-10-CM | POA: Diagnosis not present

## 2021-11-13 DIAGNOSIS — I890 Lymphedema, not elsewhere classified: Secondary | ICD-10-CM | POA: Diagnosis not present

## 2021-11-13 DIAGNOSIS — I872 Venous insufficiency (chronic) (peripheral): Secondary | ICD-10-CM | POA: Diagnosis not present

## 2021-11-13 NOTE — Progress Notes (Signed)
AQUIL, DUHE ([id number]) Visit Report for 11/13/2021 SuperBill Details Patient Name: Date of Service: [name], [name] 11/13/2021 Medical Record Number: [id number] Patient Account Number: [id number] Date of Birth/Sex: Treating RN: 18-May-1935 (86 y.o. Marcheta Grammes Primary Care Provider: Haynes Hoehn Other Clinician: Referring Provider: Treating Provider/Extender: Mellody Drown in Treatment: 1 Diagnosis Coding ICD-10 Codes Code Description I10 Essential (primary) hypertension I25.10 Atherosclerotic heart disease of native coronary artery without angina pectoris J44.9 Chronic obstructive pulmonary disease, unspecified I87.2 Venous insufficiency (chronic) (peripheral) L97.511 Non-pressure chronic ulcer of other part of right foot limited to breakdown of skin Facility Procedures CPT4 Code Description Modifier Quantity [id number] - WOUND CARE VISIT-LEV 3 EST PT 1 Electronic Signature(s) Signed: 11/13/2021 4:16:38 PM By: [name] MD FACS Signed: 11/13/2021 4:56:32 PM By: Lorrin Jackson Entered By: Lorrin Jackson on 11/13/2021 14:25:57

## 2021-11-13 NOTE — Progress Notes (Signed)
[name], [name] ([id number]) Visit Report for 11/13/2021 Arrival Information Details Patient Name: Date of Service: [name], [name] 11/13/2021 2:00 PM Medical Record Number: [id number] Patient Account Number: [id number] Date of Birth/Sex: Treating RN: Jan 05, 1936 (86 y.o. Marcheta Grammes Primary Care Janaisha Tolsma: Haynes Hoehn Other Clinician: Referring Darline Faith: Treating Wilburta Milbourn/Extender: Mellody Drown in Treatment: 1 Visit Information History Since Last Visit Added or deleted any medications: No Patient Arrived: Wheel Chair Any new allergies or adverse reactions: No Arrival Time: 14:13 Had a fall or experienced change in No Transfer Assistance: None activities of daily living that may affect Patient Identification Verified: Yes risk of falls: Secondary Verification Process Completed: Yes Signs or symptoms of abuse/neglect since last visito No Patient Requires Transmission-Based Precautions: No Hospitalized since last visit: No Patient Has Alerts: Yes Implantable device outside of the clinic excluding No Patient Alerts: R ABI = 1.0 cellular tissue based products placed in the center since last visit: Pain Present Now: No Electronic Signature(s) Signed: 11/13/2021 4:56:32 PM By: Lorrin Jackson Entered By: Lorrin Jackson on 11/13/2021 14:14:34 -------------------------------------------------------------------------------- Clinic Level of Care Assessment Details Patient Name: Date of Service: [name], [name] 11/13/2021 2:00 PM Medical Record Number: [id number] Patient Account Number: [id number] Date of Birth/Sex: Treating RN: 23-Nov-1935 (86 y.o. Marcheta Grammes Primary Care Cordell Coke: Haynes Hoehn Other Clinician: Referring Alichia Alridge: Treating Sharaya Boruff/Extender: Mellody Drown in Treatment: 1 Clinic Level of Care Assessment Items TOOL 4 Quantity Score X- 1 0 Use when only an EandM is performed on FOLLOW-UP visit ASSESSMENTS - Nursing  Assessment / Reassessment X- 1 10 Reassessment of Co-morbidities (includes updates in patient status) X- 1 5 Reassessment of Adherence to Treatment Plan ASSESSMENTS - Wound and Skin A ssessment / Reassessment X - Simple Wound Assessment / Reassessment - one wound 1 5 '[]'$  - 0 Complex Wound Assessment / Reassessment - multiple wounds '[]'$  - 0 Dermatologic / Skin Assessment (not related to wound area) ASSESSMENTS - Focused Assessment '[]'$  - 0 Circumferential Edema Measurements - multi extremities '[]'$  - 0 Nutritional Assessment / Counseling / Intervention '[]'$  - 0 Lower Extremity Assessment (monofilament, tuning fork, pulses) '[]'$  - 0 Peripheral Arterial Disease Assessment (using hand held doppler) ASSESSMENTS - Ostomy and/or Continence Assessment and Care '[]'$  - 0 Incontinence Assessment and Management '[]'$  - 0 Ostomy Care Assessment and Management (repouching, etc.) PROCESS - Coordination of Care '[]'$  - 0 Simple Patient / Family Education for ongoing care X- 1 20 Complex (extensive) Patient / Family Education for ongoing care X- 1 10 Staff obtains Programmer, systems, Records, T Results / Process Orders est '[]'$  - 0 Staff telephones HHA, Nursing Homes / Clarify orders / etc '[]'$  - 0 Routine Transfer to another Facility (non-emergent condition) '[]'$  - 0 Routine Hospital Admission (non-emergent condition) '[]'$  - 0 New Admissions / Biomedical engineer / Ordering NPWT Apligraf, etc. , '[]'$  - 0 Emergency Hospital Admission (emergent condition) '[]'$  - 0 Simple Discharge Coordination '[]'$  - 0 Complex (extensive) Discharge Coordination PROCESS - Special Needs '[]'$  - 0 Pediatric / Minor Patient Management '[]'$  - 0 Isolation Patient Management '[]'$  - 0 Hearing / Language / Visual special needs '[]'$  - 0 Assessment of Community assistance (transportation, D/C planning, etc.) '[]'$  - 0 Additional assistance / Altered mentation '[]'$  - 0 Support Surface(s) Assessment (bed, cushion, seat, etc.) INTERVENTIONS - Wound  Cleansing / Measurement X - Simple Wound Cleansing - one wound 1 5 '[]'$  - 0 Complex Wound Cleansing - multiple wounds X- 1 5 Wound Imaging (photographs -  any number of wounds) '[]'$  - 0 Wound Tracing (instead of photographs) X- 1 5 Simple Wound Measurement - one wound '[]'$  - 0 Complex Wound Measurement - multiple wounds INTERVENTIONS - Wound Dressings X - Small Wound Dressing one or multiple wounds 1 10 '[]'$  - 0 Medium Wound Dressing one or multiple wounds '[]'$  - 0 Large Wound Dressing one or multiple wounds '[]'$  - 0 Application of Medications - topical '[]'$  - 0 Application of Medications - injection INTERVENTIONS - Miscellaneous '[]'$  - 0 External ear exam '[]'$  - 0 Specimen Collection (cultures, biopsies, blood, body fluids, etc.) '[]'$  - 0 Specimen(s) / Culture(s) sent or taken to Lab for analysis '[]'$  - 0 Patient Transfer (multiple staff / Civil Service fast streamer / Similar devices) '[]'$  - 0 Simple Staple / Suture removal (25 or less) '[]'$  - 0 Complex Staple / Suture removal (26 or more) '[]'$  - 0 Hypo / Hyperglycemic Management (close monitor of Blood Glucose) '[]'$  - 0 Ankle / Brachial Index (ABI) - do not check if billed separately X- 1 5 Vital Signs Has the patient been seen at the hospital within the last three years: Yes Total Score: 80 Level Of Care: New/Established - Level 3 Electronic Signature(s) Signed: 11/13/2021 4:56:32 PM By: Lorrin Jackson Entered By: Lorrin Jackson on 11/13/2021 14:25:50 -------------------------------------------------------------------------------- Encounter Discharge Information Details Patient Name: Date of Service: [name], [name] 11/13/2021 2:00 PM Medical Record Number: [id number] Patient Account Number: [id number] Date of Birth/Sex: Treating RN: 11-11-1935 (86 y.o. Marcheta Grammes Primary Care Camry Robello: Haynes Hoehn Other Clinician: Referring Keshana Klemz: Treating Sahan Pen/Extender: Mellody Drown in Treatment: 1 Encounter Discharge Information  Items Discharge Condition: Stable Ambulatory Status: Wheelchair Discharge Destination: Home Transportation: Private Auto Accompanied By: wife Schedule Follow-up Appointment: Yes Clinical Summary of Care: Provided on 11/13/2021 Form Type Recipient Paper Patient Patient Electronic Signature(s) Signed: 11/13/2021 4:56:32 PM By: Lorrin Jackson Entered By: Lorrin Jackson on 11/13/2021 14:25:18 -------------------------------------------------------------------------------- Patient/Caregiver Education Details Patient Name: Date of Service: [name], [name] 10/5/2023andnbsp2:00 PM Medical Record Number: [id number] Patient Account Number: [id number] Date of Birth/Gender: Treating RN: [birth year] (86 y.o. Marcheta Grammes Primary Care Physician: Haynes Hoehn Other Clinician: Referring Physician: Treating Physician/Extender: Mellody Drown in Treatment: 1 Education Assessment Education Provided To: Patient and Caregiver Education Topics Provided Wound/Skin Impairment: Methods: Explain/Verbal, Printed Responses: State content correctly Electronic Signature(s) Signed: 11/13/2021 4:56:32 PM By: Lorrin Jackson Entered By: Lorrin Jackson on 11/13/2021 14:24:56 -------------------------------------------------------------------------------- Wound Assessment Details Patient Name: Date of Service: [name], [name] 11/13/2021 2:00 PM Medical Record Number: [id number] Patient Account Number: [id number] Date of Birth/Sex: Treating RN: 1935/06/02 (86 y.o. 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 between 1st and 2nd oe Secondary Lymphedema Wounding Event: Blister Etiology: Date Acquired: 10/31/2021 Wound Open Weeks Of Treatment: 1 Status: Clustered Wound:  No Comorbid Cataracts, Chronic Obstructive Pulmonary Disease (COPD), History: Coronary Artery Disease, Deep Vein Thrombosis, Hypertension, Peripheral Venous Disease, Osteoarthritis, Received Chemotherapy, Received Radiation Photos Wound Measurements Length: (cm) 1 Width: (cm) 1 Depth: (cm) 0.1 Area: (cm) 0.785 Volume: (cm) 0.079 % Reduction in Area: 34.3% % Reduction in Volume: 33.6% Epithelialization: Small (1-33%) Tunneling: No Undermining: No Wound Description Classification: Partial Thickness Wound Margin: Distinct, outline attached Exudate Amount: Medium Exudate Type: Serous Exudate Color: amber Foul Odor After Cleansing: No Slough/Fibrino No Wound Bed Granulation Amount: Large (  67-100%) Exposed Structure Granulation Quality: Red, Pink Fascia Exposed: No Necrotic Amount: Small (1-33%) Fat Layer (Subcutaneous Tissue) Exposed: No Necrotic Quality: Adherent Slough Tendon Exposed: No Muscle Exposed: No Joint Exposed: No Bone Exposed: No Limited to Skin Breakdown Periwound Skin Texture Texture Color No Abnormalities Noted: No No Abnormalities Noted: No Callus: No Atrophie Blanche: No Crepitus: No Cyanosis: No Excoriation: No Ecchymosis: No Induration: No Erythema: Yes Rash: No Erythema Location: Circumferential Scarring: No Hemosiderin Staining: No Mottled: No Moisture Pallor: No No Abnormalities Noted: No Rubor: No Dry / Scaly: No Maceration: No Temperature / Pain Temperature: No Abnormality Electronic Signature(s) Signed: 11/13/2021 4:56:32 PM By: Lorrin Jackson Entered By: Lorrin Jackson on 11/13/2021 14:21:45 -------------------------------------------------------------------------------- Vitals Details Patient Name: Date of Service: [name], Giovonnie G. 11/13/2021 2:00 PM Medical Record Number: [id number] Patient Account Number: [id number] Date of Birth/Sex: Treating RN: Jun 19, 1935 (86 y.o. Marcheta Grammes Primary Care Ronelle Michie: Haynes Hoehn Other Clinician: Referring Niala Stcharles: Treating Hady Niemczyk/Extender: Mellody Drown in Treatment: 1 Vital Signs Time Taken: 14:14 Temperature (F): 97.4 Height (in): 74 Pulse (bpm): 57 Weight (lbs): 270 Respiratory Rate (breaths/min): 18 Body Mass Index (BMI): 34.7 Blood Pressure (mmHg): 128/65 Reference Range: 80 - 120 mg / dl Electronic Signature(s) Signed: 11/13/2021 4:56:32 PM By: Lorrin Jackson Entered By: Lorrin Jackson on 11/13/2021 14:15:04

## 2021-11-20 ENCOUNTER — Encounter (HOSPITAL_BASED_OUTPATIENT_CLINIC_OR_DEPARTMENT_OTHER): Payer: Medicare Other | Admitting: General Surgery

## 2021-11-20 DIAGNOSIS — I872 Venous insufficiency (chronic) (peripheral): Secondary | ICD-10-CM | POA: Diagnosis not present

## 2021-11-20 NOTE — Progress Notes (Signed)
[place], [place] ([id number]) [id number] Page 1 of 6 Visit Report for 11/20/2021 Arrival Information Details Patient Name: Date of Service: [name], [name] 11/20/2021 2:45 PM Medical Record Number: [id number] Patient Account Number: [id number] Date of Birth/Sex: Treating RN: September 10, 1935 (86 y.o. [name] Primary Care Haylea Schlichting: Haynes Hoehn Other Clinician: Referring Chidera Thivierge: Treating Halbert Jesson/Extender: Mellody Drown in Treatment: 2 Visit Information History Since Last Visit Added or deleted any medications: No Patient Arrived: Wheel Chair Any new allergies or adverse reactions: No Arrival Time: 15:03 Had a fall or experienced change in No Accompanied By: spouse activities of daily living that may affect Transfer Assistance: Manual risk of falls: Patient Identification Verified: Yes Signs or symptoms of abuse/neglect since last visito No Secondary Verification Process Completed: Yes Hospitalized since last visit: No Patient Requires Transmission-Based Precautions: No Implantable device outside of the clinic excluding No Patient Has Alerts: Yes cellular tissue based products placed in the center Patient Alerts: R ABI = 1.0 since last visit: Has Dressing in Place as Prescribed: Yes Has Compression in Place as Prescribed: Yes Pain Present Now: No Electronic Signature(s) Signed: 11/20/2021 5:52:03 PM By: Baruch [name] RN, BSN Entered By: Baruch [name] on 11/20/2021 15:12:22 -------------------------------------------------------------------------------- Lower Extremity Assessment Details Patient Name: Date of Service: [name], [name] 11/20/2021 2:45 PM Medical Record Number: [id number] Patient Account Number: [id number] Date of Birth/Sex: Treating RN: 15-Jul-1935 (86 y.o. [name] Primary Care Tymon Nemetz: Haynes Hoehn Other Clinician: Referring Suliman Termini: Treating Quill Grinder/Extender: Mellody Drown in Treatment: 2 Edema Assessment Assessed: [name]: No] [Right: No] Edema: [Left: Ye] [Right: s] Calf Left: Right: Point of Measurement: From Medial Instep 39 cm Ankle Left: Right: Point of Measurement: From Medial Instep 25 cm Vascular Assessment Pulses: Dorsalis Pedis Palpable: [Right:No [id number] Page 2 of 6] Electronic Signature(s) Signed: 11/20/2021 5:52:03 PM By: Baruch [name] RN, BSN Entered By: Baruch [name] on 11/20/2021 15:19:30 -------------------------------------------------------------------------------- Multi Wound Chart Details Patient Name: Date of Service: [name], [name] 11/20/2021 2:45 PM Medical Record Number: [id number] Patient Account Number: [id number] Date of Birth/Sex: Treating RN: 1935/06/23 (86 y.o. M) Primary Care Eero Dini: Haynes Hoehn Other Clinician: Referring Bernadene Garside: Treating Liya Strollo/Extender: Mellody Drown in Treatment: 2 Vital Signs Height(in): 74 Pulse(bpm): 43 Weight(lbs): 270 Blood Pressure(mmHg): 121/58 Body Mass Index(BMI): 34.7 Temperature(F): 98.5 Respiratory Rate(breaths/min): 18 [9:Photos:] [N/A:N/A] Right, Dorsal Foot N/A N/A Wound Location: Blister N/A N/A Wounding Event: Fungal N/A N/A Primary Etiology: Lymphedema N/A N/A Secondary Etiology: Cataracts, Chronic Obstructive N/A N/A Comorbid History: Pulmonary Disease (COPD), Coronary Artery Disease, Deep Vein Thrombosis, Hypertension, Peripheral Venous Disease, Osteoarthritis, Received Chemotherapy, Received Radiation 10/31/2021 N/A N/A Date Acquired: 2 N/A N/A Weeks of Treatment: Open N/A N/A Wound Status: No N/A N/A Wound Recurrence: 8x6x0.1 N/A N/A Measurements L x W x D (cm) 37.699 N/A N/A A (cm) : rea 3.77 N/A N/A Volume (cm) : -3057.40% N/A N/A % Reduction in A rea: -3068.10% N/A N/A % Reduction in Volume: Partial Thickness N/A N/A Classification: Large N/A N/A Exudate A  mount: Serous N/A N/A Exudate Type: amber N/A N/A Exudate Color: Yes N/A N/A Foul Odor A Cleansing: fter No N/A N/A Odor A nticipated Due to Product Use: Distinct, outline attached N/A N/A Wound Margin: Large (67-100%) N/A N/A Granulation A mount: Red, Pink N/A N/A Granulation Quality: Small (1-33%) N/A N/A Necrotic A mount: Fascia: No N/A N/A Exposed Structures: Fat Layer (Subcutaneous Tissue): No Tendon: No Muscle: No Joint: No Bone: No Limited to Skin Breakdown  Small (1-33%) N/A N/A Epithelialization: Caris, Ely G ([id number]) [id number] Page 3 of 6 Debridement - Selective/Open Wound N/A N/A Debridement: 15:30 N/A N/A Pre-procedure Verification/Time Out Taken: [facility] N/A N/A Tissue Debrided: Skin/Epidermis N/A N/A Level: 6 N/A N/A Debridement A (sq cm): rea Curette, Forceps, Scissors N/A N/A Instrument: Minimum N/A N/A Bleeding: Pressure N/A N/A Hemostasis A chieved: 0 N/A N/A Procedural Pain: 0 N/A N/A Post Procedural Pain: Procedure was tolerated well N/A N/A Debridement Treatment Response: 8.1x6x0.1 N/A N/A Post Debridement Measurements L x W x D (cm) 3.817 N/A N/A Post Debridement Volume: (cm) Excoriation: Yes N/A N/A Periwound Skin Texture: Induration: No Callus: No Crepitus: No Rash: No Scarring: No Maceration: Yes N/A N/A Periwound Skin Moisture: Dry/Scaly: No Erythema: Yes N/A N/A Periwound Skin Color: Atrophie Blanche: No Cyanosis: No Ecchymosis: No Hemosiderin Staining: No Mottled: No Pallor: No Rubor: No Circumferential N/A N/A Erythema Location: No Abnormality N/A N/A Temperature: Debridement N/A N/A Procedures Performed: Treatment Notes Electronic Signature(s) Signed: 11/20/2021 3:54:39 PM By: [name] MD FACS Entered By: [name] on 11/20/2021 15:54:39 -------------------------------------------------------------------------------- Multi-Disciplinary Care Plan  Details Patient Name: Date of Service: [name], [name] 11/20/2021 2:45 PM Medical Record Number: [id number] Patient Account Number: [id number] Date of Birth/Sex: Treating RN: 11/10/1935 (86 y.o. [name] Primary Care Jeilyn Reznik: Haynes Hoehn Other Clinician: Referring Talecia Sherlin: Treating Tejuan Gholson/Extender: Mellody Drown in Treatment: 2 Multidisciplinary Care Plan reviewed with physician Active Inactive Wound/Skin Impairment Nursing Diagnoses: Impaired tissue integrity Knowledge deficit related to ulceration/compromised skin integrity Goals: Patient/caregiver will verbalize understanding of skin care regimen Date Initiated: 11/06/2021 Target Resolution Date: 12/04/2021 Goal Status: Active Ulcer/skin breakdown will have a volume reduction of 30% by week 4 Date Initiated: 11/06/2021 Target Resolution Date: 12/04/2021 Goal Status: Active Interventions: Assess patient/caregiver ability to obtain necessary supplies Schulte, Tylik G ([id number]) [id number] Page 4 of 6 Assess patient/caregiver ability to perform ulcer/skin care regimen upon admission and as needed Assess ulceration(s) every visit Provide education on ulcer and skin care Treatment Activities: Skin care regimen initiated : 11/06/2021 Topical wound management initiated : 11/06/2021 Notes: Electronic Signature(s) Signed: 11/20/2021 5:52:03 PM By: Baruch [name] RN, BSN Entered By: Baruch [name] on 11/20/2021 15:24:31 -------------------------------------------------------------------------------- Pain Assessment Details Patient Name: Date of Service: [name], [name] 11/20/2021 2:45 PM Medical Record Number: [id number] Patient Account Number: [id number] Date of Birth/Sex: Treating RN: 1935/08/23 (86 y.o. [name] Primary Care Granvil Djordjevic: Haynes Hoehn Other Clinician: Referring Nevaan Bunton: Treating Apolonio Cutting/Extender: Mellody Drown in  Treatment: 2 Active Problems Location of Pain Severity and Description of Pain Patient Has Paino No Site Locations Rate the pain. Current Pain Level: 0 Pain Management and Medication Current Pain Management: Electronic Signature(s) Signed: 11/20/2021 5:52:03 PM By: Baruch [name] RN, BSN Entered By: Baruch [name] on 11/20/2021 15:13:19 -------------------------------------------------------------------------------- Patient/Caregiver Education Details Patient Name: Date of Service: [name], [name] 10/12/2023andnbsp2:45 PM Medical Record Number: [id number] Patient Account Number: [id number] [place], [place] ([id number]) [id number] Page 5 of 6 Date of Birth/Gender: Treating RN: 12-May-1935 (86 y.o. [name] Primary Care Physician: Haynes Hoehn Other Clinician: Referring Physician: Treating Physician/Extender: Mellody Drown in Treatment: 2 Education Assessment Education Provided To: Patient Education Topics Provided Infection: Methods: Explain/Verbal Responses: Reinforcements needed, State content correctly Wound/Skin Impairment: Methods: Explain/Verbal Responses: Reinforcements needed, State content correctly Electronic Signature(s) Signed: 11/20/2021 5:52:03 PM By: Baruch [name] RN, BSN Entered By: Baruch [name] on 11/20/2021 15:24:53 -------------------------------------------------------------------------------- Wound Assessment Details Patient Name: Date of Service: [name], [name]  G. 11/20/2021 2:45 PM Medical Record Number: [id number] Patient Account Number: [id number] Date of Birth/Sex: Treating RN: December 13, 1935 (86 y.o. [name] Primary Care Mekhai Venuto: Haynes Hoehn Other Clinician: Referring Maurilio Puryear: Treating Shereka Lafortune/Extender: Mellody Drown in Treatment: 2 Wound Status Wound Number: 9 Primary Fungal Etiology: Wound Location: Right, Dorsal Foot Secondary Lymphedema Wounding  Event: Blister Etiology: Date Acquired: 10/31/2021 Wound Open Weeks Of Treatment: 2 Status: Clustered Wound: No Comorbid Cataracts, Chronic Obstructive Pulmonary Disease (COPD), History: Coronary Artery Disease, Deep Vein Thrombosis, Hypertension, Peripheral Venous Disease, Osteoarthritis, Received Chemotherapy, Received Radiation Photos Wound Measurements Length: (cm) 8 Width: (cm) 6 Depth: (cm) 0.1 Area: (cm) 37.699 Volume: (cm) 3.77 Clonch, Tullio G ([id number]) % Reduction in Area: -3057.4% % Reduction in Volume: -3068.1% Epithelialization: Small (1-33%) Tunneling: No Undermining: No [id number] Page 6 of 6 Wound Description Classification: Partial Thickness Wound Margin: Distinct, outline attached Exudate Amount: Large Exudate Type: Serous Exudate Color: amber Foul Odor After Cleansing: Yes Due to Product Use: No Slough/Fibrino No Wound Bed Granulation Amount: Large (67-100%) Exposed Structure Granulation Quality: Red, Pink Fascia Exposed: No Necrotic Amount: Small (1-33%) Fat Layer (Subcutaneous Tissue) Exposed: No Necrotic Quality: Adherent Slough Tendon Exposed: No Muscle Exposed: No Joint Exposed: No Bone Exposed: No Limited to Skin Breakdown Periwound Skin Texture Texture Color No Abnormalities Noted: No No Abnormalities Noted: No Callus: No Atrophie Blanche: No Crepitus: No Cyanosis: No Excoriation: Yes Ecchymosis: No Induration: No Erythema: Yes Rash: No Erythema Location: Circumferential Scarring: No Hemosiderin Staining: No Mottled: No Moisture Pallor: No No Abnormalities Noted: No Rubor: No Dry / Scaly: No Maceration: Yes Temperature / Pain Temperature: No Abnormality Electronic Signature(s) Signed: 11/20/2021 5:52:03 PM By: Baruch [name] RN, BSN Entered By: Baruch [name] on 11/20/2021 15:23:39 -------------------------------------------------------------------------------- Vitals Details Patient  Name: Date of Service: [name], Alexio G. 11/20/2021 2:45 PM Medical Record Number: [id number] Patient Account Number: [id number] Date of Birth/Sex: Treating RN: 09/06/1935 (86 y.o. [name] Primary Care Kaleisha Bhargava: Haynes Hoehn Other Clinician: Referring Pearce Littlefield: Treating Harryette Shuart/Extender: Mellody Drown in Treatment: 2 Vital Signs Time Taken: 15:12 Temperature (F): 98.5 Height (in): 74 Pulse (bpm): 59 Weight (lbs): 270 Respiratory Rate (breaths/min): 18 Body Mass Index (BMI): 34.7 Blood Pressure (mmHg): 121/58 Reference Range: 80 - 120 mg / dl Electronic Signature(s) Signed: 11/20/2021 5:52:03 PM By: Baruch [name] RN, BSN Entered By: Baruch [name] on 11/20/2021 15:13:03

## 2021-11-24 NOTE — Progress Notes (Signed)
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 (chronic) (peripheral) L97.511 Non-pressure chronic ulcer of other part of right foot limited to breakdown of skin Facility Procedures : CPT4 Code: [id number] Description: 45364 - DEBRIDE WOUND 1ST 20 SQ CM OR < ICD-10 Diagnosis Description 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-pressure chronic ulcer of other part of right foot limited to breakdown of s Modifier: kin Quantity: 1 Physician Procedures : CPT4 Code Description Modifier [id number] - WC PHYS LEVEL 4 - EST PT 25 ICD-10 Diagnosis Description L97.511 Non-pressure chronic ulcer of other part of right foot limited to breakdown of skin I87.2 Venous insufficiency (chronic) (peripheral) J44.9  Chronic obstructive pulmonary disease, unspecified I25.10 Atherosclerotic heart disease of native coronary artery without angina pectoris Quantity: 1 : [id number] - WC PHYS DEBR WO ANESTH 20 SQ CM ICD-10 Diagnosis Description L97.511 Non-pressure chronic ulcer of other part of right foot limited to breakdown of skin Quantity: 1 Electronic Signature(s) Signed: 11/20/2021 4:02:22 PM By: [name] MD FACS Entered By: [name] on 11/20/2021 16:02:21

## 2021-11-27 ENCOUNTER — Encounter (HOSPITAL_BASED_OUTPATIENT_CLINIC_OR_DEPARTMENT_OTHER): Payer: Medicare Other | Admitting: Internal Medicine

## 2021-11-27 DIAGNOSIS — I872 Venous insufficiency (chronic) (peripheral): Secondary | ICD-10-CM | POA: Diagnosis not present
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
## 2021-11-27 NOTE — Progress Notes (Signed)
JOFFRE, LUCKS ([id number]) [id number] Page 1 of 9 Visit Report for 11/27/2021 Arrival Information Details Patient Name: Date of Service: [name], Brooklyn G. 11/27/2021 2:00 PM Medical Record Number: [id number] Patient Account Number: [id number] Date of Birth/Sex: Treating RN: 05-28-1935 (86 y.o. [name] Primary Care Ronne Stefanski: Haynes Hoehn Other Clinician: Referring Raistlin Gum: Treating Shante Maysonet/Extender: Oren Binet in Treatment: 3 Visit Information History Since Last Visit Added or deleted any medications: No Patient Arrived: Wheel Chair Any new allergies or adverse reactions: No Arrival Time: 14:00 Had a fall or experienced change in No Accompanied By: spouse activities of daily living that may affect Transfer Assistance: None risk of falls: Patient Identification Verified: Yes Signs or symptoms of abuse/neglect since last visito No Secondary Verification Process Completed: Yes Hospitalized since last visit: No Patient Requires Transmission-Based Precautions: No Implantable device outside of the clinic excluding No Patient Has Alerts: Yes cellular tissue based products placed in the center Patient Alerts: R ABI = 1.0 since last visit: Has Dressing in Place as Prescribed: Yes Has Compression in Place as Prescribed: Yes Pain Present Now: No Electronic Signature(s) Signed: 11/27/2021 5:12:18 PM By: Baruch [name] RN, BSN Entered By: Baruch [name] on 11/27/2021 14:01:43 -------------------------------------------------------------------------------- Clinic Level of Care Assessment Details Patient Name: Date of Service: [name], Silvester G. 11/27/2021 2:00 PM Medical Record Number: [id number] Patient Account Number: [id number] Date of Birth/Sex: Treating RN: 04/08/1935 (86 y.o. [name] Primary Care Felicita Nuncio: Haynes Hoehn Other Clinician: Referring Julieanne Hadsall: Treating Reign Dziuba/Extender: Oren Binet in Treatment: 3 Clinic Level of Care Assessment Items TOOL 4 Quantity Score '[]'$  - 0 Use when only an EandM is performed on FOLLOW-UP visit ASSESSMENTS - Nursing Assessment / Reassessment X- 1 10 Reassessment of Co-morbidities (includes updates in patient status) X- 1 5 Reassessment of Adherence to Treatment Plan ASSESSMENTS - Wound and Skin A ssessment / Reassessment X - Simple Wound Assessment / Reassessment - one wound 1 5 '[]'$  - 0 Complex Wound Assessment / Reassessment - multiple wounds '[]'$  - 0 Dermatologic / Skin Assessment (not related to wound area) ASSESSMENTS - Focused Assessment X- 1 5 Circumferential Edema Measurements - multi extremities '[]'$  - 0 Nutritional Assessment / Counseling / Intervention Fiorentino, Jafari G ([id number]) [id number] Page 2 of 9 X- 1 5 Lower Extremity Assessment (monofilament, tuning fork, pulses) '[]'$  - 0 Peripheral Arterial Disease Assessment (using hand held doppler) ASSESSMENTS - Ostomy and/or Continence Assessment and Care '[]'$  - 0 Incontinence Assessment and Management '[]'$  - 0 Ostomy Care Assessment and Management (repouching, etc.) PROCESS - Coordination of Care X - Simple Patient / Family Education for ongoing care 1 15 '[]'$  - 0 Complex (extensive) Patient / Family Education for ongoing care X- 1 10 Staff obtains Programmer, systems, Records, T Results / Process Orders est '[]'$  - 0 Staff telephones HHA, Nursing Homes / Clarify orders / etc '[]'$  - 0 Routine Transfer to another Facility (non-emergent condition) '[]'$  - 0 Routine Hospital Admission (non-emergent condition) '[]'$  - 0 New Admissions / Biomedical engineer / Ordering NPWT Apligraf, etc. , '[]'$  - 0 Emergency Hospital Admission (emergent condition) X- 1 10 Simple Discharge Coordination '[]'$  - 0 Complex (extensive) Discharge Coordination PROCESS - Special Needs '[]'$  - 0 Pediatric / Minor Patient Management '[]'$  - 0 Isolation Patient Management '[]'$  - 0 Hearing /  Language / Visual special needs '[]'$  - 0 Assessment of Community assistance (transportation, D/C planning, etc.) '[]'$  - 0 Additional assistance / Altered mentation '[]'$  - 0 Support Surface(s) Assessment (bed,  cushion, seat, etc.) INTERVENTIONS - Wound Cleansing / Measurement X - Simple Wound Cleansing - one wound 1 5 '[]'$  - 0 Complex Wound Cleansing - multiple wounds X- 1 5 Wound Imaging (photographs - any number of wounds) '[]'$  - 0 Wound Tracing (instead of photographs) X- 1 5 Simple Wound Measurement - one wound '[]'$  - 0 Complex Wound Measurement - multiple wounds INTERVENTIONS - Wound Dressings X - Small Wound Dressing one or multiple wounds 1 10 '[]'$  - 0 Medium Wound Dressing one or multiple wounds '[]'$  - 0 Large Wound Dressing one or multiple wounds X- 1 5 Application of Medications - topical '[]'$  - 0 Application of Medications - injection INTERVENTIONS - Miscellaneous '[]'$  - 0 External ear exam '[]'$  - 0 Specimen Collection (cultures, biopsies, blood, body fluids, etc.) '[]'$  - 0 Specimen(s) / Culture(s) sent or taken to Lab for analysis '[]'$  - 0 Patient Transfer (multiple staff / Civil Service fast streamer / Similar devices) '[]'$  - 0 Simple Staple / Suture removal (25 or less) '[]'$  - 0 Complex Staple / Suture removal (26 or more) '[]'$  - 0 Hypo / Hyperglycemic Management (close monitor of Blood Glucose) Mcbee, Donavan G ([id number]) [id number] Page 3 of 9 '[]'$  - 0 Ankle / Brachial Index (ABI) - do not check if billed separately X- 1 5 Vital Signs Has the patient been seen at the hospital within the last three years: Yes Total Score: 100 Level Of Care: New/Established - Level 3 Electronic Signature(s) Signed: 11/27/2021 5:12:18 PM By: Baruch [name] RN, BSN Entered By: Baruch [name] on 11/27/2021 14:31:54 -------------------------------------------------------------------------------- Encounter Discharge Information Details Patient Name: Date of Service: [name], Daiquan G.  11/27/2021 2:00 PM Medical Record Number: [id number] Patient Account Number: [id number] Date of Birth/Sex: Treating RN: 08-Jan-1936 (86 y.o. [name] Primary Care Ankur Snowdon: Haynes Hoehn Other Clinician: Referring Niyam Bisping: Treating Gianlucca Szymborski/Extender: Oren Binet in Treatment: 3 Encounter Discharge Information Items Discharge Condition: Stable Ambulatory Status: Wheelchair Discharge Destination: Home Transportation: Private Auto Accompanied By: spouse Schedule Follow-up Appointment: Yes Clinical Summary of Care: Patient Declined Electronic Signature(s) Signed: 11/27/2021 5:12:18 PM By: Baruch [name] RN, BSN Entered By: Baruch [name] on 11/27/2021 16:42:39 -------------------------------------------------------------------------------- Lower Extremity Assessment Details Patient Name: Date of Service: [name], Joevanni G. 11/27/2021 2:00 PM Medical Record Number: [id number] Patient Account Number: [id number] Date of Birth/Sex: Treating RN: 01/18/1936 (86 y.o. [name] Primary Care Mohannad Olivero: Haynes Hoehn Other Clinician: Referring Tamaka Sawin: Treating Talha Iser/Extender: Oren Binet in Treatment: 3 Edema Assessment Assessed: [name]: No] Patrice Paradise: No] Edema: [Left: Ye] [Right: s] Calf Left: Right: Point of Measurement: From Medial Instep 37 cm Ankle Left: Right: Point of Measurement: From Medial Instep 25 cm Vascular Assessment Left: [[id number] Page 4 of 9Right:] Pulses: Dorsalis Pedis Palpable: [[id number] Page 4 of 9Yes] Electronic Signature(s) Signed: 11/27/2021 5:12:18 PM By: Baruch [name] RN, BSN Entered By: Baruch [name] on 11/27/2021 14:11:24 -------------------------------------------------------------------------------- Multi Wound Chart Details Patient Name: Date of Service: [name], Ewald G. 11/27/2021 2:00 PM Medical Record Number:  [id number] Patient Account Number: [id number] Date of Birth/Sex: Treating RN: 1935-08-03 (86 y.o. M) Primary Care Nusaiba Guallpa: Haynes Hoehn Other Clinician: Referring Kresta Templeman: Treating Cleotilde Spadaccini/Extender: Oren Binet in Treatment: 3 Vital Signs Height(in): 74 Pulse(bpm): 60 Weight(lbs): 270 Blood Pressure(mmHg): 134/74 Body Mass Index(BMI): 34.7 Temperature(F): 98.1 Respiratory Rate(breaths/min): 20 [9:Photos:] [N/A:N/A] Right, Dorsal Foot N/A N/A Wound Location: Blister N/A N/A Wounding Event: Fungal N/A N/A Primary Etiology: Lymphedema N/A N/A Secondary Etiology: Cataracts, Chronic Obstructive N/A N/A Comorbid History: Pulmonary  Disease (COPD), Coronary Artery Disease, Deep Vein Thrombosis, Hypertension, Peripheral Venous Disease, Osteoarthritis, Received Chemotherapy, Received Radiation 10/31/2021 N/A N/A Date Acquired: 3 N/A N/A Weeks of Treatment: Open N/A N/A Wound Status: No N/A N/A Wound Recurrence: 0.1x0.1x0.1 N/A N/A Measurements L x W x D (cm) 0.008 N/A N/A A (cm) : rea 0.001 N/A N/A Volume (cm) : 99.30% N/A N/A % Reduction in A rea: 99.20% N/A N/A % Reduction in Volume: Partial Thickness N/A N/A Classification: Small N/A N/A Exudate A mount: Serous N/A N/A Exudate Type: amber N/A N/A Exudate Color: Distinct, outline attached N/A N/A Wound Margin: Small (1-33%) N/A N/A Granulation A mount: Red, Pink N/A N/A Granulation Quality: None Present (0%) N/A N/A Necrotic A mount: Fascia: No N/A N/A Exposed Structures: Fat Layer (Subcutaneous Tissue): No Tendon: No Muscle: No Joint: No Bone: No Ates, Daryl G ([id number]) [id number] Page 5 of 9 Limited to Skin Breakdown Large (67-100%) N/A N/A Epithelialization: Excoriation: Yes N/A N/A Periwound Skin Texture: Induration: No Callus: No Crepitus: No Rash: No Scarring: No Maceration: No N/A N/A Periwound Skin Moisture: Dry/Scaly:  No Atrophie Blanche: No N/A N/A Periwound Skin Color: Cyanosis: No Ecchymosis: No Erythema: No Hemosiderin Staining: No Mottled: No Pallor: No Rubor: No No Abnormality N/A N/A Temperature: Treatment Notes Electronic Signature(s) Signed: 11/27/2021 4:29:29 PM By: Linton Ham MD Entered By: Linton Ham on 11/27/2021 15:05:43 -------------------------------------------------------------------------------- Multi-Disciplinary Care Plan Details Patient Name: Date of Service: [name], Elizah G. 11/27/2021 2:00 PM Medical Record Number: [id number] Patient Account Number: [id number] Date of Birth/Sex: Treating RN: Feb 12, 1935 (86 y.o. [name] Primary Care Frankie Scipio: Haynes Hoehn Other Clinician: Referring Rashied Corallo: Treating Yazlin Ekblad/Extender: Oren Binet in Treatment: 3 Multidisciplinary Care Plan reviewed with physician Active Inactive Wound/Skin Impairment Nursing Diagnoses: Impaired tissue integrity Knowledge deficit related to ulceration/compromised skin integrity Goals: Patient/caregiver will verbalize understanding of skin care regimen Date Initiated: 11/06/2021 Target Resolution Date: 12/04/2021 Goal Status: Active Ulcer/skin breakdown will have a volume reduction of 30% by week 4 Date Initiated: 11/06/2021 Target Resolution Date: 12/04/2021 Goal Status: Active Interventions: Assess patient/caregiver ability to obtain necessary supplies Assess patient/caregiver ability to perform ulcer/skin care regimen upon admission and as needed Assess ulceration(s) every visit Provide education on ulcer and skin care Treatment Activities: Skin care regimen initiated : 11/06/2021 Topical wound management initiated : 11/06/2021 Notes: Electronic Signature(s) Signed: 11/27/2021 5:12:18 PM By: Baruch [name] RN, BSN Estelline, Ewing ([id number]) [id number] Page 6 of 9 Signed: 11/27/2021 5:12:18 PM By: Baruch [name] RN,  BSN Entered By: Baruch [name] on 11/27/2021 14:14:36 -------------------------------------------------------------------------------- Pain Assessment Details Patient Name: Date of Service: [name], Haron G. 11/27/2021 2:00 PM Medical Record Number: [id number] Patient Account Number: [id number] Date of Birth/Sex: Treating RN: 1935-08-16 (86 y.o. [name] Primary Care Rossana Molchan: Haynes Hoehn Other Clinician: Referring Quinnie Barcelo: Treating Achol Azpeitia/Extender: Oren Binet in Treatment: 3 Active Problems Location of Pain Severity and Description of Pain Patient Has Paino No Site Locations Rate the pain. Current Pain Level: 0 Pain Management and Medication Current Pain Management: Electronic Signature(s) Signed: 11/27/2021 5:12:18 PM By: Baruch [name] RN, BSN Entered By: Baruch [name] on 11/27/2021 14:10:36 -------------------------------------------------------------------------------- Patient/Caregiver Education Details Patient Name: Date of Service: [name], Jakson G. 10/19/2023andnbsp2:00 PM Medical Record Number: [id number] Patient Account Number: [id number] Date of Birth/Gender: Treating RN: 1936-01-09 (86 y.o. [name] Primary Care Physician: Haynes Hoehn Other Clinician: Referring Physician: Treating Physician/Extender: Oren Binet in Treatment: 3 Education Assessment Education Provided To: Patient Education  Topics Provided Trippett, Braidan G ([id number]) [id number] Page 7 of 9 Wound/Skin Impairment: Methods: Explain/Verbal Responses: Reinforcements needed, State content correctly Electronic Signature(s) Signed: 11/27/2021 5:12:18 PM By: Baruch [name] RN, BSN Entered By: Baruch [name] on 11/27/2021 14:15:16 -------------------------------------------------------------------------------- Wound Assessment Details Patient Name: Date of Service: [name], Britt G. 11/27/2021 2:00  PM Medical Record Number: [id number] Patient Account Number: [id number] Date of Birth/Sex: Treating RN: 22-Apr-1935 (86 y.o. [name] Primary Care Joelly Bolanos: Haynes Hoehn Other Clinician: Referring Chauncey Sciulli: Treating Jarmel Linhardt/Extender: Oren Binet in Treatment: 3 Wound Status Wound Number: 9 Primary Fungal Etiology: Wound Location: Right, Dorsal Foot Secondary Lymphedema Wounding Event: Blister Etiology: Date Acquired: 10/31/2021 Wound Open Weeks Of Treatment: 3 Status: Clustered Wound: No Comorbid Cataracts, Chronic Obstructive Pulmonary Disease (COPD), History: Coronary Artery Disease, Deep Vein Thrombosis, Hypertension, Peripheral Venous Disease, Osteoarthritis, Received Chemotherapy, Received Radiation Photos Wound Measurements Length: (cm) 0.1 Width: (cm) 0.1 Depth: (cm) 0.1 Area: (cm) 0.008 Volume: (cm) 0.001 % Reduction in Area: 99.3% % Reduction in Volume: 99.2% Epithelialization: Large (67-100%) Tunneling: No Undermining: No Wound Description Classification: Partial Thickness Wound Margin: Distinct, outline attached Exudate Amount: Small Exudate Type: Serous Exudate Color: amber Foul Odor After Cleansing: No Slough/Fibrino No Wound Bed Granulation Amount: Small (1-33%) Exposed Structure Granulation Quality: Red, Pink Fascia Exposed: No Necrotic Amount: None Present (0%) Fat Layer (Subcutaneous Tissue) Exposed: No Tendon Exposed: No Muscle Exposed: No Joint Exposed: No Bone Exposed: No Hailu, Darrick G ([id number]) [id number] Page 8 of 9 Limited to Skin Breakdown Periwound Skin Texture Texture Color No Abnormalities Noted: Yes No Abnormalities Noted: Yes Moisture Temperature / Pain No Abnormalities Noted: Yes Temperature: No Abnormality Treatment Notes Wound #9 (Foot) Wound Laterality: Dorsal, Right Cleanser Peri-Wound Care Ketoconazole Cream 2% Discharge Instruction: Apply Ketoconazole  mix with zinc oxide 1:1 Zinc Oxide Ointment 30g tube Discharge Instruction: Apply Zinc Oxide to periwound with each dressing change Topical Primary Dressing Aquacel Ag Extra Hydrofiber Dressing w/ Silver, 4x5 (in/in) Discharge Instruction: apply to wound bed and between toes Secondary Dressing Woven Gauze Sponge, Non-Sterile 4x4 in Discharge Instruction: Apply over primary dressing as directed. Secured With [employer], 4.5x3.1 (in/yd) Discharge Instruction: Secure with Kerlix as directed. Transpore Surgical Tape, 2x10 (in/yd) Discharge Instruction: Secure dressing with tape as directed. Compression Wrap Compression Stockings Add-Ons Electronic Signature(s) Signed: 11/27/2021 5:12:18 PM By: Baruch [name] RN, BSN Entered By: Baruch [name] on 11/27/2021 14:14:05 -------------------------------------------------------------------------------- Vitals Details Patient Name: Date of Service: [name], Jerrard G. 11/27/2021 2:00 PM Medical Record Number: [id number] Patient Account Number: [id number] Date of Birth/Sex: Treating RN: December 20, 1935 (86 y.o. [name] Primary Care Nyeemah Jennette: Haynes Hoehn Other Clinician: Referring Jariya Reichow: Treating Huie Ghuman/Extender: Oren Binet in Treatment: 3 Vital Signs Time Taken: 14:01 Temperature (F): 98.1 Height (in): 74 Pulse (bpm): 91 Weight (lbs): 270 Respiratory Rate (breaths/min): 20 Body Mass Index (BMI): 34.7 Blood Pressure (mmHg): 134/74 Reference Range: 80 - 120 mg / dl Electronic Signature(s) Signed: 11/27/2021 5:12:18 PM By: Baruch [name] RN, BSN Ponce, Sundiata G ([id number]) [id number] Page 9 of 9 Entered By: Baruch [name] on 11/27/2021 14:02:18

## 2021-12-04 ENCOUNTER — Encounter (HOSPITAL_BASED_OUTPATIENT_CLINIC_OR_DEPARTMENT_OTHER): Payer: Medicare Other | Admitting: General Surgery

## 2021-12-04 DIAGNOSIS — I872 Venous insufficiency (chronic) (peripheral): Secondary | ICD-10-CM | POA: Diagnosis not present
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
## 2021-12-09 NOTE — Progress Notes (Signed)
JAVARIUS, TSOSIE ([id number]) [id number] Page 1 of 9 Visit Report for 12/04/2021 Arrival Information Details Patient Name: Date of Service: [name], [name] 12/04/2021 12:30 PM Medical Record Number: [id number] Patient Account Number: [id number] Date of Birth/Sex: Treating RN: 04/13/1935 (86 y.o. [name] Primary Care Jahnia Hewes: Haynes Hoehn Other Clinician: Referring Carlynn Leduc: Treating Woodroe Vogan/Extender: Mellody Drown in Treatment: 4 Visit Information History Since Last Visit Added or deleted any medications: No Patient Arrived: Wheel Chair Any new allergies or adverse reactions: No Arrival Time: 12:48 Had a fall or experienced change in No Accompanied By: spouse activities of daily living that may affect Transfer Assistance: None risk of falls: Patient Identification Verified: Yes Signs or symptoms of abuse/neglect since last visito No Secondary Verification Process Completed: Yes Hospitalized since last visit: No Patient Requires Transmission-Based Precautions: No Implantable device outside of the clinic excluding No Patient Has Alerts: Yes cellular tissue based products placed in the center Patient Alerts: R ABI = 1.0 since last visit: Has Dressing in Place as Prescribed: Yes Has Compression in Place as Prescribed: Yes Pain Present Now: No Electronic Signature(s) Signed: 12/04/2021 4:58:33 PM By: Baruch [name] RN, BSN Entered By: Baruch [name] on 12/04/2021 12:49:17 -------------------------------------------------------------------------------- Clinic Level of Care Assessment Details Patient Name: Date of Service: [name], [name] 12/04/2021 12:30 PM Medical Record Number: [id number] Patient Account Number: [id number] Date of Birth/Sex: Treating RN: 03-26-1935 (86 y.o. [name] Primary Care Kendall Arnell: Haynes Hoehn Other Clinician: Referring Krystn Dermody: Treating Marlaya Turck/Extender: Mellody Drown in Treatment: 4 Clinic Level of Care Assessment Items TOOL 4 Quantity Score _0  - 0 Use when only an EandM is performed on FOLLOW-UP visit ASSESSMENTS - Nursing Assessment / Reassessment X- 1 10 Reassessment of Co-morbidities (includes updates in patient status) X- 1 5 Reassessment of Adherence to Treatment Plan ASSESSMENTS - Wound and Skin A ssessment / Reassessment X - Simple Wound Assessment / Reassessment - one wound 1 5 _1  - 0 Complex Wound Assessment / Reassessment - multiple wounds _2  - 0 Dermatologic / Skin Assessment (not related to wound area) ASSESSMENTS - Focused Assessment X- 1 5 Circumferential Edema Measurements - multi extremities _3  - 0 Nutritional Assessment / Counseling / Intervention Journey, Mylen G ([id number]) [id number] Page 2 of 9 X- 1 5 Lower Extremity Assessment (monofilament, tuning fork, pulses) _4  - 0 Peripheral Arterial Disease Assessment (using hand held doppler) ASSESSMENTS - Ostomy and/or Continence Assessment and Care _5  - 0 Incontinence Assessment and Management _6  - 0 Ostomy Care Assessment and Management (repouching, etc.) PROCESS - Coordination of Care X - Simple Patient / Family Education for ongoing care 1 15 _7  - 0 Complex (extensive) Patient / Family Education for ongoing care X- 1 10 Staff obtains Programmer, systems, Records, T Results / Process Orders est _8  - 0 Staff telephones HHA, Nursing Homes / Clarify orders / etc _9  - 0 Routine Transfer to another Facility (non-emergent condition) _10  - 0 Routine Hospital Admission (non-emergent condition) _11  - 0 New Admissions / Biomedical engineer / Ordering NPWT Apligraf, etc. , _12  - 0 Emergency Hospital Admission (emergent condition) X- 1 10 Simple Discharge Coordination _13  - 0 Complex (extensive) Discharge Coordination PROCESS - Special Needs _14  - 0 Pediatric / Minor Patient Management _15  - 0 Isolation Patient Management _16  - 0 Hearing /  Language / Visual special needs _17  - 0 Assessment of Community assistance (transportation, D/C planning, etc.) _18  - 0 Additional assistance / Altered mentation _19  - 0 Support Surface(s) Assessment (bed,  cushion, seat, etc.) INTERVENTIONS - Wound Cleansing / Measurement X - Simple Wound Cleansing - one wound 1 5 _0  - 0 Complex Wound Cleansing - multiple wounds X- 1 5 Wound Imaging (photographs - any number of wounds) _1  - 0 Wound Tracing (instead of photographs) X- 1 5 Simple Wound Measurement - one wound _2  - 0 Complex Wound Measurement - multiple wounds INTERVENTIONS - Wound Dressings X - Small Wound Dressing one or multiple wounds 1 10 _3  - 0 Medium Wound Dressing one or multiple wounds _4  - 0 Large Wound Dressing one or multiple wounds X- 1 5 Application of Medications - topical <YVOPFYTWKMQKMMNO>_1<\/RRNHAFBXUXYBFXOV>_2  - 0 Application of Medications - injection INTERVENTIONS - Miscellaneous _6  - 0 External ear exam _7  - 0 Specimen Collection (cultures, biopsies, blood, body fluids, etc.) _8  - 0 Specimen(s) / Culture(s) sent or taken to Lab for analysis _9  - 0 Patient Transfer (multiple staff / Civil Service fast streamer / Similar devices) _10  - 0 Simple Staple / Suture removal (25 or less) _11  - 0 Complex Staple / Suture removal (26 or more) _12  - 0 Hypo / Hyperglycemic Management (close monitor of Blood Glucose) Briggs, Jarrod G ([id number]) [id number] Page 3 of 9 _13  - 0 Ankle / Brachial Index (ABI) - do not check if billed separately X- 1 5 Vital Signs Has the patient been seen at the hospital within the last three years: Yes Total Score: 100 Level Of Care: New/Established - Level 3 Electronic Signature(s) Signed: 12/04/2021 4:58:33 PM By: Baruch [name] RN, BSN Entered By: Baruch [name] on 12/04/2021 13:24:10 -------------------------------------------------------------------------------- Encounter Discharge Information Details Patient Name: Date of Service: [name], [name]  12/04/2021 12:30 PM Medical Record Number: [id number] Patient Account Number: [id number] Date of Birth/Sex: Treating RN: April 23, 1935 (86 y.o. [name] Primary Care Deunte Bledsoe: Haynes Hoehn Other Clinician: Referring Tarek Cravens: Treating Shyler Hamill/Extender: Mellody Drown in Treatment: 4 Encounter Discharge Information Items Discharge Condition: Stable Ambulatory Status: Wheelchair Discharge Destination: Home Transportation: Private Auto Accompanied By: spouse Schedule Follow-up Appointment: Yes Clinical Summary of Care: Patient Declined Electronic Signature(s) Signed: 12/04/2021 4:58:33 PM By: Baruch [name] RN, BSN Entered By: Baruch [name] on 12/04/2021 13:24:56 -------------------------------------------------------------------------------- Lower Extremity Assessment Details Patient Name: Date of Service: [name], [name] 12/04/2021 12:30 PM Medical Record Number: [id number] Patient Account Number: [id number] Date of Birth/Sex: Treating RN: 1935/04/16 (86 y.o. [name] Primary Care Jylan Loeza: Haynes Hoehn Other Clinician: Referring Jasmeen Fritsch: Treating Chevy Sweigert/Extender: Mellody Drown in Treatment: 4 Edema Assessment Assessed: [name]: No] [Right: No] Edema: [Left: Ye] [Right: s] Calf Left: Right: Point of Measurement: From Medial Instep 40 cm Ankle Left: Right: Point of Measurement: From Medial Instep 26 cm Vascular Assessment Left: [[id number] Page 4 of 9Right:] Pulses: Dorsalis Pedis Palpable: [[id number] Page 4 of 9Yes] Electronic Signature(s) Signed: 12/04/2021 4:58:33 PM By: Baruch [name] RN, BSN Entered By: Baruch [name] on 12/04/2021 12:53:45 -------------------------------------------------------------------------------- Multi Wound Chart Details Patient Name: Date of Service: [name], [name] 12/04/2021 12:30 PM Medical Record Number:  [id number] Patient Account Number: [id number] Date of Birth/Sex: Treating RN: 03-27-1935 (86 y.o. M) Primary Care Emeree Mahler: Haynes Hoehn Other Clinician: Referring Iliany Losier: Treating Rolena Knutson/Extender: Mellody Drown in Treatment: 4 Vital Signs Height(in): 74 Pulse(bpm): 45 Weight(lbs): 270 Blood Pressure(mmHg): 120/69 Body Mass Index(BMI): 34.7 Temperature(F): 97.6 Respiratory Rate(breaths/min): 18 [9:Photos:] [N/A:N/A] Right, Dorsal Foot N/A N/A Wound Location: Blister N/A N/A Wounding Event: Fungal N/A N/A Primary Etiology: Lymphedema N/A N/A Secondary Etiology: Cataracts, Chronic Obstructive N/A N/A Comorbid History: Pulmonary  Disease (COPD), Coronary Artery Disease, Deep Vein Thrombosis, Hypertension, Peripheral Venous Disease, Osteoarthritis, Received Chemotherapy, Received Radiation 10/31/2021 N/A N/A Date Acquired: 4 N/A N/A Weeks of Treatment: Open N/A N/A Wound Status: No N/A N/A Wound Recurrence: 0.1x0.1x0.1 N/A N/A Measurements L x W x D (cm) 0.008 N/A N/A A (cm) : rea 0.001 N/A N/A Volume (cm) : 99.30% N/A N/A % Reduction in A rea: 99.20% N/A N/A % Reduction in Volume: Partial Thickness N/A N/A Classification: Small N/A N/A Exudate A mount: Serous N/A N/A Exudate Type: amber N/A N/A Exudate Color: Distinct, outline attached N/A N/A Wound Margin: None Present (0%) N/A N/A Granulation A mount: None Present (0%) N/A N/A Necrotic A mount: Fascia: No N/A N/A Exposed Structures: Fat Layer (Subcutaneous Tissue): No Tendon: No Muscle: No Joint: No Bone: No Limited to Skin Breakdown Roebuck, Stephenson G ([id number]) [id number] Page 5 of 9 Large (67-100%) N/A N/A Epithelialization: Excoriation: Yes N/A N/A Periwound Skin Texture: Induration: No Callus: No Crepitus: No Rash: No Scarring: No Maceration: No N/A N/A Periwound Skin Moisture: Dry/Scaly: No Erythema: Yes N/A N/A Periwound Skin  Color: Atrophie Blanche: No Cyanosis: No Ecchymosis: No Hemosiderin Staining: No Mottled: No Pallor: No Rubor: No No Abnormality N/A N/A Temperature: Treatment Notes Electronic Signature(s) Signed: 12/04/2021 1:22:30 PM By: [name] MD FACS Entered By: [name] on 12/04/2021 13:22:30 -------------------------------------------------------------------------------- Multi-Disciplinary Care Plan Details Patient Name: Date of Service: [name], [name] 12/04/2021 12:30 PM Medical Record Number: [id number] Patient Account Number: [id number] Date of Birth/Sex: Treating RN: [birth year] (86 y.o. [name] Primary Care Divine Hansley: Haynes Hoehn Other Clinician: Referring Rakel Junio: Treating Toluwani Yadav/Extender: Mellody Drown in Treatment: 4 Multidisciplinary Care Plan reviewed with physician Active Inactive Wound/Skin Impairment Nursing Diagnoses: Impaired tissue integrity Knowledge deficit related to ulceration/compromised skin integrity Goals: Patient/caregiver will verbalize understanding of skin care regimen Date Initiated: 11/06/2021 Target Resolution Date: 01/01/2022 Goal Status: Active Ulcer/skin breakdown will have a volume reduction of 30% by week 4 Date Initiated: 11/06/2021 Date Inactivated: 12/04/2021 Target Resolution Date: 12/04/2021 Goal Status: Met Ulcer/skin breakdown will have a volume reduction of 50% by week 8 Date Initiated: 12/04/2021 Target Resolution Date: 01/01/2022 Goal Status: Active Interventions: Assess patient/caregiver ability to obtain necessary supplies Assess patient/caregiver ability to perform ulcer/skin care regimen upon admission and as needed Assess ulceration(s) every visit Provide education on ulcer and skin care Treatment Activities: Skin care regimen initiated : 11/06/2021 Topical wound management initiated : 11/06/2021 Notes: MAKIH, STEFANKO ([id number]) [id number] Page 6  of 9 Electronic Signature(s) Signed: 12/04/2021 4:58:33 PM By: Baruch [name] RN, BSN Entered By: Baruch [name] on 12/04/2021 13:00:34 -------------------------------------------------------------------------------- Pain Assessment Details Patient Name: Date of Service: [name], [name] 12/04/2021 12:30 PM Medical Record Number: [id number] Patient Account Number: [id number] Date of Birth/Sex: Treating RN: 1935-11-03 (86 y.o. [name] Primary Care Quadir Muns: Haynes Hoehn Other Clinician: Referring Algie Cales: Treating Bradford Cazier/Extender: Mellody Drown in Treatment: 4 Active Problems Location of Pain Severity and Description of Pain Patient Has Paino No Site Locations Rate the pain. Current Pain Level: 0 Pain Management and Medication Current Pain Management: Electronic Signature(s) Signed: 12/04/2021 4:58:33 PM By: Baruch [name] RN, BSN Entered By: Baruch [name] on 12/04/2021 12:49:58 -------------------------------------------------------------------------------- Patient/Caregiver Education Details Patient Name: Date of Service: [name], Ettore G. 10/26/2023andnbsp12:30 PM Medical Record Number: [id number] Patient Account Number: [id number] Date of Birth/Gender: Treating RN: 02-11-1935 (86 y.o. [name] Primary Care Physician: Haynes Hoehn Other Clinician: Referring Physician: Treating Physician/Extender: Jadene Pierini,  Haynes Hoehn in Treatment: 4 Education Assessment Education Provided To: Patient ADORIAN, GWYNNE ([id number]) [id number] Page 7 of 9 Education Topics Provided Infection: Methods: Explain/Verbal Responses: Reinforcements needed, State content correctly Wound/Skin Impairment: Methods: Explain/Verbal Responses: Reinforcements needed, State content correctly Electronic Signature(s) Signed: 12/04/2021 4:58:33 PM By: Baruch [name] RN, BSN Entered By: Baruch [name] on 12/04/2021  13:01:07 -------------------------------------------------------------------------------- Wound Assessment Details Patient Name: Date of Service: [name], [name] 12/04/2021 12:30 PM Medical Record Number: [id number] Patient Account Number: [id number] Date of Birth/Sex: Treating RN: 09/05/1935 (86 y.o. [name] Primary Care Silvino Selman: Haynes Hoehn Other Clinician: Referring Sharnee Douglass: Treating Timithy Arons/Extender: Mellody Drown in Treatment: 4 Wound Status Wound Number: 9 Primary Fungal Etiology: Wound Location: Right, Dorsal Foot Secondary Lymphedema Wounding Event: Blister Etiology: Date Acquired: 10/31/2021 Wound Open Weeks Of Treatment: 4 Status: Clustered Wound: No Comorbid Cataracts, Chronic Obstructive Pulmonary Disease (COPD), History: Coronary Artery Disease, Deep Vein Thrombosis, Hypertension, Peripheral Venous Disease, Osteoarthritis, Received Chemotherapy, Received Radiation Photos Wound Measurements Length: (cm) 0.1 Width: (cm) 0.1 Depth: (cm) 0.1 Area: (cm) 0.008 Volume: (cm) 0.001 % Reduction in Area: 99.3% % Reduction in Volume: 99.2% Epithelialization: Large (67-100%) Tunneling: No Undermining: No Wound Description Classification: Partial Thickness Wound Margin: Distinct, outline attached Exudate Amount: Small Exudate Type: Serous Exudate Color: amber Foul Odor After Cleansing: No Slough/Fibrino No Wound Bed Granulation Amount: None Present (0%) Exposed Structure Betty, Deren G ([id number]) [id number] Page 8 of 9 Necrotic Amount: None Present (0%) Fascia Exposed: No Fat Layer (Subcutaneous Tissue) Exposed: No Tendon Exposed: No Muscle Exposed: No Joint Exposed: No Bone Exposed: No Limited to Skin Breakdown Periwound Skin Texture Texture Color No Abnormalities Noted: Yes No Abnormalities Noted: No Atrophie Blanche: No Moisture Cyanosis: No No Abnormalities Noted: Yes Ecchymosis:  No Erythema: Yes Hemosiderin Staining: No Mottled: No Pallor: No Rubor: No Temperature / Pain Temperature: No Abnormality Treatment Notes Wound #9 (Foot) Wound Laterality: Dorsal, Right Cleanser Peri-Wound Care Ketoconazole Cream 2% Discharge Instruction: Apply Ketoconazole mix with zinc oxide 1:1 Zinc Oxide Ointment 30g tube Discharge Instruction: Apply Zinc Oxide to periwound with each dressing change Topical Primary Dressing Aquacel Ag Extra Hydrofiber Dressing w/ Silver, 4x5 (in/in) Discharge Instruction: apply to wound bed and between toes Secondary Dressing Woven Gauze Sponge, Non-Sterile 4x4 in Discharge Instruction: Apply over primary dressing as directed. Secured With [employer], 4.5x3.1 (in/yd) Discharge Instruction: Secure with Kerlix as directed. Transpore Surgical Tape, 2x10 (in/yd) Discharge Instruction: Secure dressing with tape as directed. Compression Wrap Compression Stockings Add-Ons Electronic Signature(s) Signed: 12/04/2021 4:58:33 PM By: Baruch [name] RN, BSN Signed: 12/09/2021 4:16:20 PM By: Blanche East RN Entered By: Blanche East on 12/04/2021 13:03:11 -------------------------------------------------------------------------------- Vitals Details Patient Name: Date of Service: [name], [name] 12/04/2021 12:30 PM Medical Record Number: [id number] Patient Account Number: [id number] Date of Birth/Sex: Treating RN: [birth year] (86 y.o. [name] Primary Care Darel Ricketts: Haynes Hoehn Other Clinician: HARINDER, ROMAS ([id number]) [id number] Page 9 of 9 Referring Roosvelt Churchwell: Treating Guerline Happ/Extender: Mellody Drown in Treatment: 4 Vital Signs Time Taken: 12:49 Temperature (F): 97.6 Height (in): 74 Pulse (bpm): 57 Weight (lbs): 270 Respiratory Rate (breaths/min): 18 Body Mass Index (BMI): 34.7 Blood Pressure (mmHg): 120/69 Reference Range: 80 - 120 mg / dl Electronic  Signature(s) Signed: 12/04/2021 4:58:33 PM By: Baruch [name] RN, BSN Entered By: Baruch [name] on 12/04/2021 12:49:48

## 2021-12-12 ENCOUNTER — Encounter (HOSPITAL_BASED_OUTPATIENT_CLINIC_OR_DEPARTMENT_OTHER): Payer: Medicare Other | Attending: General Surgery | Admitting: General Surgery
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
## 2021-12-15 NOTE — Progress Notes (Signed)
CHLOE, BAIG ([id number]) [id number] Page 1 of 8 Visit Report for 12/12/2021 Arrival Information Details Patient Name: Date of Service: [name], [name] 12/12/2021 12:30 PM Medical Record Number: [id number] Patient Account Number: [id number] Date of Birth/Sex: Treating RN: 07-30-1935 (86 y.o. Collene Gobble Primary Care Rangel Echeverri: Haynes Hoehn Other Clinician: Referring Akai Dollard: Treating Carolyna Yerian/Extender: Mellody Drown in Treatment: 5 Visit Information History Since Last Visit Added or deleted any medications: No Patient Arrived: Wheel Chair Any new allergies or adverse reactions: No Arrival Time: 12:47 Had a fall or experienced change in No Accompanied By: spouse activities of daily living that may affect Transfer Assistance: None risk of falls: Patient Identification Verified: Yes Signs or symptoms of abuse/neglect since last visito No Patient Requires Transmission-Based Precautions: No Hospitalized since last visit: No Patient Has Alerts: Yes Implantable device outside of the clinic excluding No Patient Alerts: R ABI = 1.0 cellular tissue based products placed in the center since last visit: Has Dressing in Place as Prescribed: Yes Pain Present Now: No Electronic Signature(s) Signed: 12/12/2021 6:23:49 PM By: Dellie Catholic RN Entered By: Dellie Catholic on 12/12/2021 12:48:43 -------------------------------------------------------------------------------- Clinic Level of Care Assessment Details Patient Name: Date of Service: [name], [name] 12/12/2021 12:30 PM Medical Record Number: [id number] Patient Account Number: [id number] Date of Birth/Sex: Treating RN: December 31, 1935 (86 y.o. Collene Gobble Primary Care Wynee Matarazzo: Haynes Hoehn Other Clinician: Referring Tishawna Larouche: Treating Keylah Darwish/Extender: Mellody Drown in Treatment: 5 Clinic Level of Care Assessment Items TOOL 4 Quantity Score X- 1  0 Use when only an EandM is performed on FOLLOW-UP visit ASSESSMENTS - Nursing Assessment / Reassessment X- 1 10 Reassessment of Co-morbidities (includes updates in patient status) X- 1 5 Reassessment of Adherence to Treatment Plan ASSESSMENTS - Wound and Skin A ssessment / Reassessment X - Simple Wound Assessment / Reassessment - one wound 1 5 '[]'$  - 0 Complex Wound Assessment / Reassessment - multiple wounds '[]'$  - 0 Dermatologic / Skin Assessment (not related to wound area) ASSESSMENTS - Focused Assessment '[]'$  - 0 Circumferential Edema Measurements - multi extremities X- 1 10 Nutritional Assessment / Counseling / Intervention Rovito, Kamoni G ([id number]) [id number] Page 2 of 8 '[]'$  - 0 Lower Extremity Assessment (monofilament, tuning fork, pulses) '[]'$  - 0 Peripheral Arterial Disease Assessment (using hand held doppler) ASSESSMENTS - Ostomy and/or Continence Assessment and Care '[]'$  - 0 Incontinence Assessment and Management '[]'$  - 0 Ostomy Care Assessment and Management (repouching, etc.) PROCESS - Coordination of Care X - Simple Patient / Family Education for ongoing care 1 15 '[]'$  - 0 Complex (extensive) Patient / Family Education for ongoing care X- 1 10 Staff obtains Programmer, systems, Records, T Results / Process Orders est X- 1 10 Staff telephones HHA, Nursing Homes / Clarify orders / etc '[]'$  - 0 Routine Transfer to another Facility (non-emergent condition) '[]'$  - 0 Routine Hospital Admission (non-emergent condition) '[]'$  - 0 New Admissions / Biomedical engineer / Ordering NPWT Apligraf, etc. , '[]'$  - 0 Emergency Hospital Admission (emergent condition) X- 1 10 Simple Discharge Coordination '[]'$  - 0 Complex (extensive) Discharge Coordination PROCESS - Special Needs '[]'$  - 0 Pediatric / Minor Patient Management '[]'$  - 0 Isolation Patient Management '[]'$  - 0 Hearing / Language / Visual special needs '[]'$  - 0 Assessment of Community assistance (transportation, D/C  planning, etc.) '[]'$  - 0 Additional assistance / Altered mentation '[]'$  - 0 Support Surface(s) Assessment (bed, cushion, seat, etc.) INTERVENTIONS - Wound Cleansing / Measurement X - Simple Wound  Cleansing - one wound 1 5 '[]'$  - 0 Complex Wound Cleansing - multiple wounds X- 1 5 Wound Imaging (photographs - any number of wounds) '[]'$  - 0 Wound Tracing (instead of photographs) X- 1 5 Simple Wound Measurement - one wound '[]'$  - 0 Complex Wound Measurement - multiple wounds INTERVENTIONS - Wound Dressings '[]'$  - 0 Small Wound Dressing one or multiple wounds '[]'$  - 0 Medium Wound Dressing one or multiple wounds '[]'$  - 0 Large Wound Dressing one or multiple wounds '[]'$  - 0 Application of Medications - topical '[]'$  - 0 Application of Medications - injection INTERVENTIONS - Miscellaneous '[]'$  - 0 External ear exam '[]'$  - 0 Specimen Collection (cultures, biopsies, blood, body fluids, etc.) '[]'$  - 0 Specimen(s) / Culture(s) sent or taken to Lab for analysis '[]'$  - 0 Patient Transfer (multiple staff / Civil Service fast streamer / Similar devices) '[]'$  - 0 Simple Staple / Suture removal (25 or less) '[]'$  - 0 Complex Staple / Suture removal (26 or more) '[]'$  - 0 Hypo / Hyperglycemic Management (close monitor of Blood Glucose) [name], [name] G ([id number]) [id number] Page 3 of 8 '[]'$  - 0 Ankle / Brachial Index (ABI) - do not check if billed separately X- 1 5 Vital Signs Has the patient been seen at the hospital within the last three years: Yes Total Score: 95 Level Of Care: New/Established - Level 3 Electronic Signature(s) Signed: 12/12/2021 6:23:49 PM By: Dellie Catholic RN Entered By: Dellie Catholic on 12/12/2021 18:04:54 -------------------------------------------------------------------------------- Encounter Discharge Information Details Patient Name: Date of Service: [name], [name] 12/12/2021 12:30 PM Medical Record Number: [id number] Patient Account Number: [id number] Date of Birth/Sex:  Treating RN: 11-12-1935 (86 y.o. Collene Gobble Primary Care Avel Ogawa: Haynes Hoehn Other Clinician: Referring Evrett Hakim: Treating Aharon Carriere/Extender: Mellody Drown in Treatment: 5 Encounter Discharge Information Items Discharge Condition: Stable Ambulatory Status: Wheelchair Discharge Destination: Home Transportation: Private Auto Accompanied By: spouse Schedule Follow-up Appointment: Yes Clinical Summary of Care: Patient Declined Electronic Signature(s) Signed: 12/12/2021 6:23:49 PM By: Dellie Catholic RN Entered By: Dellie Catholic on 12/12/2021 18:06:00 -------------------------------------------------------------------------------- Lower Extremity Assessment Details Patient Name: Date of Service: [name], [name] 12/12/2021 12:30 PM Medical Record Number: [id number] Patient Account Number: [id number] Date of Birth/Sex: Treating RN: 07-14-1935 (86 y.o. Collene Gobble Primary Care Masoud Nyce: Haynes Hoehn Other Clinician: Referring Sheddrick Lattanzio: Treating Elim Peale/Extender: Mellody Drown in Treatment: 5 Edema Assessment Assessed: [name]: No] [Right: No] Edema: [Left: Ye] [Right: s] Calf Left: Right: Point of Measurement: From Medial Instep 40.2 cm Ankle Left: Right: Point of Measurement: From Medial Instep 26.1 cm Vascular Assessment [name], [name] G ([id number]) [Right:[id number] Page 4 of 8] Pulses: Dorsalis Pedis Palpable: [Right:Yes] Electronic Signature(s) Signed: 12/12/2021 6:23:49 PM By: Dellie Catholic RN Entered By: Dellie Catholic on 12/12/2021 12:57:55 -------------------------------------------------------------------------------- Multi Wound Chart Details Patient Name: Date of Service: [name], [name] 12/12/2021 12:30 PM Medical Record Number: [id number] Patient Account Number: [id number] Date of Birth/Sex: Treating RN: [birth year] (86 y.o. M) Primary Care Larayah Clute: Haynes Hoehn Other  Clinician: Referring Clarnce Homan: Treating Rayleen Wyrick/Extender: Mellody Drown in Treatment: 5 Vital Signs Height(in): 74 Pulse(bpm): 38 Weight(lbs): 270 Blood Pressure(mmHg): 119/59 Body Mass Index(BMI): 34.7 Temperature(F): 98.1 Respiratory Rate(breaths/min): 18 [9:Photos:] [N/A:N/A] Right, Dorsal Foot N/A N/A Wound Location: Blister N/A N/A Wounding Event: Fungal N/A N/A Primary Etiology: Lymphedema N/A N/A Secondary Etiology: Cataracts, Chronic Obstructive N/A N/A Comorbid History: Pulmonary Disease (COPD), Coronary Artery Disease, Deep Vein Thrombosis, Hypertension, Peripheral Venous Disease, Osteoarthritis, Received Chemotherapy, Received Radiation 10/31/2021  N/A N/A Date Acquired: 5 N/A N/A Weeks of Treatment: Healed - Epithelialized N/A N/A Wound Status: No N/A N/A Wound Recurrence: 0x0x0 N/A N/A Measurements L x W x D (cm) 0 N/A N/A A (cm) : rea 0 N/A N/A Volume (cm) : 100.00% N/A N/A % Reduction in A rea: 100.00% N/A N/A % Reduction in Volume: Partial Thickness N/A N/A Classification: None Present N/A N/A Exudate A mount: Distinct, outline attached N/A N/A Wound Margin: None Present (0%) N/A N/A Granulation A mount: None Present (0%) N/A N/A Necrotic A mount: Fat Layer (Subcutaneous Tissue): Yes N/A N/A Exposed Structures: Fascia: No Tendon: No Muscle: No Joint: No Bone: No Large (67-100%) N/A N/A Epithelialization: Excoriation: Yes N/A N/A Periwound Skin Texture: Induration: No Callus: No [name], Gustavo G ([id number]) [id number] Page 5 of 8 Crepitus: No Rash: No Scarring: No Maceration: No N/A N/A Periwound Skin Moisture: Dry/Scaly: No Erythema: Yes N/A N/A Periwound Skin Color: Atrophie Blanche: No Cyanosis: No Ecchymosis: No Hemosiderin Staining: No Mottled: No Pallor: No Rubor: No No Abnormality N/A N/A Temperature: Treatment Notes Electronic Signature(s) Signed:  12/12/2021 1:09:52 PM By: [name] MD FACS Entered By: [name] on 12/12/2021 13:09:52 -------------------------------------------------------------------------------- Multi-Disciplinary Care Plan Details Patient Name: Date of Service: [name], [name] 12/12/2021 12:30 PM Medical Record Number: [id number] Patient Account Number: [id number] Date of Birth/Sex: Treating RN: 06/18/1935 (86 y.o. Collene Gobble Primary Care Maryjayne Kleven: Haynes Hoehn Other Clinician: Referring Lonnell Chaput: Treating Zeniyah Peaster/Extender: Mellody Drown in Treatment: [place] reviewed with physician Active Inactive Electronic Signature(s) Signed: 12/12/2021 6:23:49 PM By: Dellie Catholic RN Entered By: Dellie Catholic on 12/12/2021 18:03:12 -------------------------------------------------------------------------------- Pain Assessment Details Patient Name: Date of Service: [name], [name] 12/12/2021 12:30 PM Medical Record Number: [id number] Patient Account Number: [id number] Date of Birth/Sex: Treating RN: 16-Nov-1935 (86 y.o. Collene Gobble Primary Care Nadirah Socorro: Haynes Hoehn Other Clinician: Referring Renelda Kilian: Treating Rhonna Holster/Extender: Mellody Drown in Treatment: 5 Active Problems Location of Pain Severity and Description of Pain Patient Has Paino No Site Locations [place], [place] G ([id number]) [id number] Page 6 of 8 Pain Management and Medication Current Pain Management: Electronic Signature(s) Signed: 12/12/2021 6:23:49 PM By: Dellie Catholic RN Entered By: Dellie Catholic on 12/12/2021 12:49:21 -------------------------------------------------------------------------------- Patient/Caregiver Education Details Patient Name: Date of Service: [name], [name] 11/3/2023andnbsp12:30 PM Medical Record Number: [id number] Patient Account Number: [id number] Date of Birth/Gender: Treating RN: [birth year] (86  y.o. Collene Gobble Primary Care Physician: Haynes Hoehn Other Clinician: Referring Physician: Treating Physician/Extender: Mellody Drown in Treatment: 5 Education Assessment Education Provided To: Patient Education Topics Provided Wound/Skin Impairment: Methods: Explain/Verbal Responses: Return demonstration correctly Electronic Signature(s) Signed: 12/12/2021 6:23:49 PM By: Dellie Catholic RN Entered By: Dellie Catholic on 12/12/2021 18:03:25 -------------------------------------------------------------------------------- Wound Assessment Details Patient Name: Date of Service: [name], [name] 12/12/2021 12:30 PM Medical Record Number: [id number] Patient Account Number: [id number] Date of Birth/Sex: Treating RN: [birth year] (86 y.o. [name] Joli Koob: Haynes Hoehn Other Clinician: Referring Jovanni Rash: Treating Aboubacar Matsuo/Extender: Johnnie, Goynes, Samil Darnell Level ([id number]) [id number] Page 7 of 8 Weeks in Treatment: 5 Wound Status Wound Number: 9 Primary Fungal Etiology: Wound Location: Right, Dorsal Foot Secondary Lymphedema Wounding Event: Blister Etiology: Date Acquired: 10/31/2021 Wound Healed - Epithelialized Weeks Of Treatment: 5 Status: Clustered Wound: No Comorbid Cataracts, Chronic Obstructive Pulmonary Disease (COPD), History: Coronary Artery Disease, Deep Vein Thrombosis, Hypertension, Peripheral Venous Disease, Osteoarthritis, Received Chemotherapy, Received Radiation Photos Wound Measurements Length: (cm) Width: (  cm) Depth: (cm) Area: (cm) Volume: (cm) 0 % Reduction in Area: 100% 0 % Reduction in Volume: 100% 0 Epithelialization: Large (67-100%) 0 Tunneling: No 0 Undermining: No Wound Description Classification: Partial Thickness Wound Margin: Distinct, outline attached Exudate Amount: None Present Foul Odor After Cleansing: No Slough/Fibrino No Wound  Bed Granulation Amount: None Present (0%) Exposed Structure Necrotic Amount: None Present (0%) Fascia Exposed: No Fat Layer (Subcutaneous Tissue) Exposed: Yes Tendon Exposed: No Muscle Exposed: No Joint Exposed: No Bone Exposed: No Periwound Skin Texture Texture Color No Abnormalities Noted: Yes No Abnormalities Noted: No Atrophie Blanche: No Moisture Cyanosis: No No Abnormalities Noted: Yes Ecchymosis: No Erythema: Yes Hemosiderin Staining: No Mottled: No Pallor: No Rubor: No Temperature / Pain Temperature: No Abnormality Electronic Signature(s) Signed: 12/12/2021 6:23:49 PM By: Dellie Catholic RN Signed: 12/15/2021 4:40:13 PM By: Blanche East RN Entered By: Dellie Catholic on 12/12/2021 13:03:51 [name], [name] G ([id number]) [id number] Page 8 of 8 -------------------------------------------------------------------------------- Vitals Details Patient Name: Date of Service: [name], Lexie G. 12/12/2021 12:30 PM Medical Record Number: [id number] Patient Account Number: [id number] Date of Birth/Sex: Treating RN: [birth year] (86 y.o. Collene Gobble Primary Care Jevan Gaunt: Haynes Hoehn Other Clinician: Referring Wilbon Obenchain: Treating Becky Berberian/Extender: Mellody Drown in Treatment: 5 Vital Signs Time Taken: 12:48 Temperature (F): 98.1 Height (in): 74 Pulse (bpm): 57 Weight (lbs): 270 Respiratory Rate (breaths/min): 18 Body Mass Index (BMI): 34.7 Blood Pressure (mmHg): 119/59 Reference Range: 80 - 120 mg / dl Electronic Signature(s) Signed: 12/12/2021 6:23:49 PM By: Dellie Catholic RN Entered By: Dellie Catholic on 12/12/2021 12:49:13

## 2022-03-06 ENCOUNTER — Encounter: Payer: Self-pay | Admitting: Pulmonary Disease

## 2022-03-06 ENCOUNTER — Ambulatory Visit (INDEPENDENT_AMBULATORY_CARE_PROVIDER_SITE_OTHER): Payer: Medicare Other | Admitting: Pulmonary Disease

## 2022-03-06 VITALS — BP 140/78 | HR 61 | Temp 98.1°F | Ht 74.0 in | Wt 261.0 lb

## 2022-03-06 DIAGNOSIS — J439 Emphysema, unspecified: Secondary | ICD-10-CM

## 2022-03-06 DIAGNOSIS — J4489 Other specified chronic obstructive pulmonary disease: Secondary | ICD-10-CM

## 2022-03-06 DIAGNOSIS — R911 Solitary pulmonary nodule: Secondary | ICD-10-CM | POA: Diagnosis not present

## 2022-03-06 NOTE — Progress Notes (Signed)
ROSALIE [name]    [id number]    1936-02-09  Primary Care Physician:Weeks, Georgeanna Lea, NP  Referring Physician: Renne Crigler, [place] [address]  Chief complaint: Follow up for lung nodule, COPD, asbestos exposure  HPI: 87 y.o.  with significant smoking history, asbestos exposure, coronary artery disease, colon cancer, peripheral vascular disease, lung nodule, COPD  He underwent navigational biopsy of slowly growing lung nodule in July 2021 which showed benign lung tissue.  In 2021 he was also seen by dermatology for left axillary lesion with activity on PET scan.  This was felt to be benign.  He underwent a scalp biopsy for a skin lesion which did not show any malignancy.  Follow-up CT in 2022 showed slight growth however case was reviewed at [facility] on 07/18/20.  Radiology did not feel that the lung nodule had changed and recommended follow-up CT scan which showed stability.  Pets: 3 dogs, cat.  No birds, farm animals Occupation: Retired Insurance account manager Exposures: Significant exposure to asbestos.  No mold, hot tub, Jacuzzi or down pillows or comforters Smoking history: 120-pack-year smoker.  Quit in 2005 Travel history: Originally lived in [place] and [place].  No significant recent travel Relevant family history: He was adopted and does not know of any family history  Interim history: Overall he is stable since last visit.  Continues on Trelegy inhaler.  Outpatient Encounter Medications as of 03/06/2022  Medication Sig   acetaminophen (TYLENOL) 500 MG tablet Take 1,000 mg by mouth every 6 (six) hours as needed.   albuterol (VENTOLIN HFA) 108 (90 Base) MCG/ACT inhaler Inhale 2 puffs into the lungs every 6 (six) hours as needed for wheezing or shortness of breath.   amLODipine-benazepril (LOTREL) 10-40 MG capsule Take 1 capsule by mouth daily.   aspirin 81 MG chewable tablet Chew 81 mg by mouth every evening.   atorvastatin (LIPITOR) 20 MG tablet  Take 1 tablet (20 mg total) by mouth every evening.   Cholecalciferol (VITAMIN D3) 50 MCG (2000 UT) TABS Take 2,000 Units by mouth 2 (two) times daily.    Cinnamon 500 MG TABS Take 500 mg by mouth 2 (two) times daily.   Cyanocobalamin (VITAMIN B12 PO) Use as directed 1 tablet in the mouth or throat daily.   Emollient (CERAVE) CREA Apply 1 application topically 2 (two) times daily.   ferrous sulfate 325 (65 FE) MG tablet Take 325 mg by mouth daily with breakfast.   Fluticasone-Umeclidin-Vilant (TRELEGY ELLIPTA) 200-62.5-25 MCG/ACT AEPB Inhale 1 puff into the lungs daily.   hydrochlorothiazide (HYDRODIURIL) 25 MG tablet Take 25 mg by mouth daily.   Magnesium 250 MG TABS Take 250 mg by mouth daily.    metoprolol tartrate (LOPRESSOR) 100 MG tablet Take 1 tablet (100 mg total) by mouth at bedtime.   Multiple Vitamins-Minerals (MENS MULTIPLE VITAMIN/LYCOPENE PO) Take 1 tablet by mouth daily.   nitroGLYCERIN (NITROSTAT) 0.4 MG SL tablet Place 1 tablet (0.4 mg total) under the tongue every 5 (five) minutes as needed.   OVER THE COUNTER MEDICATION Take 2 tablets by mouth daily. Fish, flax and borage oil   Turmeric (QC TUMERIC COMPLEX PO) Take by mouth 2 (two) times daily.   zinc gluconate 50 MG tablet Take 50 mg by mouth daily.   No facility-administered encounter medications on file as of 03/06/2022.   Physical Exam: Blood pressure (!) 140/78, pulse 61, temperature 98.1 F (36.7 C), temperature source Oral, height 6'  2" (1.88 m), weight 261 lb (118.4 kg), SpO2 93 %. Gen:      No acute distress HEENT:  EOMI, sclera anicteric Neck:     No masses; no thyromegaly Lungs:    Clear to auscultation bilaterally; normal respiratory effort CV:         Regular rate and rhythm; no murmurs Abd:      + bowel sounds; soft, non-tender; no palpable masses, no distension Ext:    No edema; adequate peripheral perfusion Skin:      Warm and dry; no rash Neuro: alert and oriented x 3 Psych: normal mood and affect    Data Reviewed: Imaging: CT chest 07/21/2019-subsolid 2.3 cm right upper lobe nodule minimally increased in size, dilated pulmonary artery PET scan 08/08/2019-low-level uptake in the right upper lobe pulmonary nodule, skin thickening in the left axilla with mild uptake, mild aneurysmal dilatation of abdominal aorta. CT chest 01/23/2020-stable right upper lobe nodule.  New 7.5 mm right upper lobe pulmonary nodule.  Stable emphysema. CT chest 07/11/2020-progressive slow enlargement in the right upper lobe nodule CT chest 02/17/2021-stable subsolid pulmonary nodule in the right upper lobe CT chest 09/26/2021-stable right upper lobe lung nodule.  I have reviewed the images personally.  PFTs: 03/23/2019 FVC 3.09 [63%], FEV1 1.96 [56%], F/F 63, TLC 6.32 [78%], DLCO 11.68 [6 6%] Moderate obstruction with minimal restriction and diffusion defect  Labs: CBC 11/14/2018-WBC 11.7, eos 7%, absolute eosinophil count 819  Pathology: Navigational lung biopsy 08/15/2019-negative Path and cytology Skin biopsy 09/14/2019-dermal scar  Assessment:  Lung nodule Negative pathology on navigational biopsy Has been stable on the last series of CT scans.  Will continue monitoring.  COPD Continue Trelegy.  Continue triple therapy with ICS as he has elevated peripheral eosinophils.  Asbestos exposure No evidence of ILD on CT.  Continue monitoring.  Abnormal PET uptake in left axilla No malignancy on dermatology evaluation.  Health maintenance Pneumovax 05/25/2017 Up-to-date with COVID-19 booster and flu vaccine  Plan/Recommendations: - CT chest without contrast in 3 months - Continue Trelegy inhaler  Marshell Garfinkel MD [facility] 03/06/2022, 5:19 PM  CC: Renne Crigler, NP

## 2022-03-06 NOTE — Patient Instructions (Signed)
I am glad you are doing well with your breathing Will get a CT chest without contrast in 3 months for follow-up of lung nodules Return to clinic in 6 months.

## 2022-03-23 ENCOUNTER — Encounter (HOSPITAL_BASED_OUTPATIENT_CLINIC_OR_DEPARTMENT_OTHER): Payer: Medicare Other | Admitting: General Surgery

## 2022-03-23 ENCOUNTER — Ambulatory Visit (HOSPITAL_BASED_OUTPATIENT_CLINIC_OR_DEPARTMENT_OTHER): Payer: Medicare Other | Admitting: General Surgery

## 2022-03-24 ENCOUNTER — Encounter (HOSPITAL_BASED_OUTPATIENT_CLINIC_OR_DEPARTMENT_OTHER): Payer: Medicare Other | Attending: General Surgery | Admitting: General Surgery

## 2022-03-24 DIAGNOSIS — Z87891 Personal history of nicotine dependence: Secondary | ICD-10-CM | POA: Insufficient documentation

## 2022-03-24 DIAGNOSIS — Z09 Encounter for follow-up examination after completed treatment for conditions other than malignant neoplasm: Secondary | ICD-10-CM | POA: Insufficient documentation

## 2022-03-24 DIAGNOSIS — Z955 Presence of coronary angioplasty implant and graft: Secondary | ICD-10-CM | POA: Diagnosis not present

## 2022-03-24 DIAGNOSIS — M1990 Unspecified osteoarthritis, unspecified site: Secondary | ICD-10-CM | POA: Insufficient documentation

## 2022-03-24 DIAGNOSIS — I82411 Acute embolism and thrombosis of right femoral vein: Secondary | ICD-10-CM | POA: Insufficient documentation

## 2022-03-24 DIAGNOSIS — Z86718 Personal history of other venous thrombosis and embolism: Secondary | ICD-10-CM | POA: Diagnosis not present

## 2022-03-24 DIAGNOSIS — L97812 Non-pressure chronic ulcer of other part of right lower leg with fat layer exposed: Secondary | ICD-10-CM | POA: Diagnosis not present

## 2022-03-24 DIAGNOSIS — I890 Lymphedema, not elsewhere classified: Secondary | ICD-10-CM | POA: Diagnosis not present

## 2022-03-24 DIAGNOSIS — I2510 Atherosclerotic heart disease of native coronary artery without angina pectoris: Secondary | ICD-10-CM | POA: Insufficient documentation

## 2022-03-24 DIAGNOSIS — D692 Other nonthrombocytopenic purpura: Secondary | ICD-10-CM | POA: Insufficient documentation

## 2022-03-24 DIAGNOSIS — I872 Venous insufficiency (chronic) (peripheral): Secondary | ICD-10-CM | POA: Insufficient documentation

## 2022-03-24 DIAGNOSIS — I714 Abdominal aortic aneurysm, without rupture, unspecified: Secondary | ICD-10-CM | POA: Diagnosis not present

## 2022-03-24 DIAGNOSIS — I10 Essential (primary) hypertension: Secondary | ICD-10-CM | POA: Diagnosis not present

## 2022-03-24 DIAGNOSIS — B353 Tinea pedis: Secondary | ICD-10-CM | POA: Insufficient documentation

## 2022-03-24 DIAGNOSIS — Z7901 Long term (current) use of anticoagulants: Secondary | ICD-10-CM | POA: Diagnosis not present

## 2022-03-24 DIAGNOSIS — C189 Malignant neoplasm of colon, unspecified: Secondary | ICD-10-CM | POA: Insufficient documentation

## 2022-03-24 DIAGNOSIS — J449 Chronic obstructive pulmonary disease, unspecified: Secondary | ICD-10-CM | POA: Insufficient documentation
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
## 2022-03-24 NOTE — Progress Notes (Signed)
KASSIM, FRISQUE ([id number]) [id number] Page 1 of 8 Visit Report for 03/24/2022 Allergy List Details Patient Name: Date of Service: [name], Mathius G. 03/24/2022 12:30 PM Medical Record Number: [id number] Patient Account Number: [id number] Date of Birth/Sex: Treating RN: 1935-05-06 (87 y.o. [name] Primary Care Jamilya Sarrazin: Haynes Hoehn Other Clinician: Referring Lashon Hillier: Treating Jalien Weakland/Extender: Mellody Drown in Treatment: 0 Allergies Active Allergies Sulfa (Sulfonamide Antibiotics) Allergy Notes Electronic Signature(s) Signed: 03/24/2022 3:32:39 PM By: Adline Peals Entered By: Adline Peals on 03/24/2022 12:45:58 -------------------------------------------------------------------------------- Arrival Information Details Patient Name: Date of Service: [name], Daekwon G. 03/24/2022 12:30 PM Medical Record Number: [id number] Patient Account Number: [id number] Date of Birth/Sex: Treating RN: 05-Jan-1936 (87 y.o. [name] Primary Care [place] Gustafson: Haynes Hoehn Other Clinician: Referring Malani Lees: Treating Bay Wayson/Extender: Mellody Drown in Treatment: 0 Visit Information Patient Arrived: Wheel Chair Arrival Time: 12:44 Accompanied By: wife Transfer Assistance: Manual Patient Identification Verified: Yes Secondary Verification Process Completed: Yes History Since Last Visit Added or deleted any medications: No Any new allergies or adverse reactions: No Had a fall or experienced change in activities of daily living that may affect risk of falls: No Signs or symptoms of abuse/neglect since last visito No Hospitalized since last visit: No Implantable device outside of the clinic excluding cellular tissue based products placed in the center since last visit: No Has Dressing in Place as Prescribed: Yes Pain Present Now: No Electronic Signature(s) Signed: 03/24/2022 3:32:39 PM By:  Adline Peals Entered By: Adline Peals on 03/24/2022 12:45:29 [place], Akili G ([id number] Page 2 of 8 -------------------------------------------------------------------------------- Clinic Level of Care Assessment Details Patient Name: Date of Service: [name], Demetrice G. 03/24/2022 12:30 PM Medical Record Number: [id number] Patient Account Number: [id number] Date of Birth/Sex: Treating RN: 19-Jun-1935 (87 y.o. [name] Primary Care Deniel Mcquiston: Haynes Hoehn Other Clinician: Referring Catie Chiao: Treating Esli Clements/Extender: Mellody Drown in Treatment: 0 Clinic Level of Care Assessment Items TOOL 2 Quantity Score X- 1 0 Use when only an EandM is performed on the INITIAL visit ASSESSMENTS - Nursing Assessment / Reassessment X- 1 20 General Physical Exam (combine w/ comprehensive assessment (listed just below) when performed on new pt. evals) X- 1 25 Comprehensive Assessment (HX, ROS, Risk Assessments, Wounds Hx, etc.) ASSESSMENTS - Wound and Skin A ssessment / Reassessment X - Simple Wound Assessment / Reassessment - one wound 1 5 []$  - 0 Complex Wound Assessment / Reassessment - multiple wounds []$  - 0 Dermatologic / Skin Assessment (not related to wound area) ASSESSMENTS - Ostomy and/or Continence Assessment and Care []$  - 0 Incontinence Assessment and Management []$  - 0 Ostomy Care Assessment and Management (repouching, etc.) PROCESS - Coordination of Care X - Simple Patient / Family Education for ongoing care 1 15 []$  - 0 Complex (extensive) Patient / Family Education for ongoing care X- 1 10 Staff obtains Programmer, systems, Records, T Results / Process Orders est []$  - 0 Staff telephones HHA, Nursing Homes / Clarify orders / etc []$  - 0 Routine Transfer to another Facility (non-emergent condition) []$  - 0 Routine Hospital Admission (non-emergent condition) []$  - 0 New Admissions / Biomedical engineer /  Ordering NPWT Apligraf, etc. , []$  - 0 Emergency Hospital Admission (emergent condition) X- 1 10 Simple Discharge Coordination []$  - 0 Complex (extensive) Discharge Coordination PROCESS - Special Needs []$  - 0 Pediatric / Minor Patient Management []$  - 0 Isolation Patient Management []$  - 0 Hearing / Language / Visual special needs []$  - 0  Assessment of Community assistance (transportation, D/C planning, etc.) []$  - 0 Additional assistance / Altered mentation []$  - 0 Support Surface(s) Assessment (bed, cushion, seat, etc.) INTERVENTIONS - Wound Cleansing / Measurement X- 1 5 Wound Imaging (photographs - any number of wounds) []$  - 0 Wound Tracing (instead of photographs) X- 1 5 Simple Wound Measurement - one wound []$  - 0 Complex Wound Measurement - multiple wounds X- 1 5 Simple Wound Cleansing - one wound []$  - 0 Complex Wound Cleansing - multiple wounds INTERVENTIONS - Wound Dressings X - Small Wound Dressing one or multiple wounds 1 10 Macphee, Alfonzia G ([id number] Page 3 of 8 []$  - 0 Medium Wound Dressing one or multiple wounds []$  - 0 Large Wound Dressing one or multiple wounds []$  - 0 Application of Medications - injection INTERVENTIONS - Miscellaneous []$  - 0 External ear exam []$  - 0 Specimen Collection (cultures, biopsies, blood, body fluids, etc.) []$  - 0 Specimen(s) / Culture(s) sent or taken to Lab for analysis []$  - 0 Patient Transfer (multiple staff / Harrel Lemon Lift / Similar devices) []$  - 0 Simple Staple / Suture removal (25 or less) []$  - 0 Complex Staple / Suture removal (26 or more) []$  - 0 Hypo / Hyperglycemic Management (close monitor of Blood Glucose) []$  - 0 Ankle / Brachial Index (ABI) - do not check if billed separately Has the patient been seen at the hospital within the last three years: Yes Total Score: 110 Level Of Care: New/Established - Level 3 Electronic Signature(s) Signed: 03/24/2022 3:32:39 PM By: Adline Peals Entered By: Adline Peals on 03/24/2022 13:29:12 -------------------------------------------------------------------------------- Encounter Discharge Information Details Patient Name: Date of Service: [name], Erion G. 03/24/2022 12:30 PM Medical Record Number: [id number] Patient Account Number: [id number] Date of Birth/Sex: Treating RN: 1935-05-30 (87 y.o. [name] Primary Care Fizza Scales: Haynes Hoehn Other Clinician: Referring Ilina Xu: Treating Ileana Chalupa/Extender: Mellody Drown in Treatment: 0 Encounter Discharge Information Items Discharge Condition: Stable Ambulatory Status: Wheelchair Discharge Destination: Home Transportation: Private Auto Accompanied By: wife Schedule Follow-up Appointment: Yes Clinical Summary of Care: Patient Declined Electronic Signature(s) Signed: 03/24/2022 3:32:39 PM By: Adline Peals Entered By: Adline Peals on 03/24/2022 13:29:35 -------------------------------------------------------------------------------- Lower Extremity Assessment Details Patient Name: Date of Service: [name], Antwoine G. 03/24/2022 12:30 PM Medical Record Number: [id number] Patient Account Number: [id number] Date of Birth/Sex: Treating RN: [birth year] (87 y.o. [name] Primary Care Lissie Hinesley: Haynes Hoehn Other Clinician: Referring Tagg Eustice: Treating Tena Linebaugh/Extender: Mellody Drown in Treatment: 0 Engel, Yuchen G ([id number]) [id number] Page 4 of 8 Electronic Signature(s) Signed: 03/24/2022 3:32:39 PM By: Adline Peals Entered By: Adline Peals on 03/24/2022 12:51:46 -------------------------------------------------------------------------------- Multi Wound Chart Details Patient Name: Date of Service: [name], Cameran G. 03/24/2022 12:30 PM Medical Record Number: [id number] Patient Account Number: [id number] Date of Birth/Sex: Treating RN: January 04, 1936 (87 y.o.  M) Primary Care Alix Stowers: Haynes Hoehn Other Clinician: Referring Marsha Hillman: Treating Judithann Villamar/Extender: Mellody Drown in Treatment: 0 Vital Signs Height(in): Pulse(bpm): 79 Weight(lbs): Blood Pressure(mmHg): 155/71 Body Mass Index(BMI): Temperature(F): 98.1 Respiratory Rate(breaths/min): 18 [10:Photos:] [N/A:N/A] Right T Great oe N/A N/A Wound Location: Gradually Appeared N/A N/A Wounding Event: T be determined o N/A N/A Primary Etiology: Cataracts, Chronic Obstructive N/A N/A Comorbid History: Pulmonary Disease (COPD), Coronary Artery Disease, Deep Vein Thrombosis, Hypertension, Peripheral Venous Disease, Osteoarthritis, Received Chemotherapy, Received Radiation 02/12/2022 N/A N/A Date Acquired: 0 N/A N/A Weeks of Treatment: Open N/A N/A Wound Status: No N/A N/A Wound Recurrence: 1x0.4x0.1 N/A N/A Measurements  L x W x D (cm) 0.314 N/A N/A A (cm) : rea 0.031 N/A N/A Volume (cm) : Full Thickness Without Exposed N/A N/A Classification: Support Structures Medium N/A N/A Exudate Amount: Serous N/A N/A Exudate Type: amber N/A N/A Exudate Color: Distinct, outline attached N/A N/A Wound Margin: Large (67-100%) N/A N/A Granulation Amount: Red N/A N/A Granulation Quality: Small (1-33%) N/A N/A Necrotic Amount: Fat Layer (Subcutaneous Tissue): Yes N/A N/A Exposed Structures: Fascia: No Tendon: No Muscle: No Joint: No Bone: No Small (1-33%) N/A N/A Epithelialization: No Abnormalities Noted N/A N/A Periwound Skin Texture: No Abnormalities Noted N/A N/A Periwound Skin Moisture: Rubor: Yes N/A N/A Periwound Skin Color: No Abnormality N/A N/A Temperature: Hallums, Holmes G ([id number] Page 5 of 8 Treatment Notes Electronic Signature(s) Signed: 03/24/2022 1:27:15 PM By: [name] MD FACS Entered By: [name] on 03/24/2022  13:27:15 -------------------------------------------------------------------------------- Multi-Disciplinary Care Plan Details Patient Name: Date of Service: [name], Damontay G. 03/24/2022 12:30 PM Medical Record Number: [id number] Patient Account Number: [id number] Date of Birth/Sex: Treating RN: 08-10-1935 (87 y.o. [name] Primary Care Towana Stenglein: Haynes Hoehn Other Clinician: Referring Matthew Pais: Treating Kalayna Noy/Extender: Mellody Drown in Treatment: 0 Active Inactive Abuse / Safety / Falls / Self Care Management Nursing Diagnoses: Impaired physical mobility Potential for falls Goals: Patient/caregiver will verbalize understanding of skin care regimen Date Initiated: 03/24/2022 Target Resolution Date: 05/15/2022 Goal Status: Active Patient/caregiver will verbalize/demonstrate measures taken to improve the patient's personal safety Date Initiated: 03/24/2022 Target Resolution Date: 05/15/2022 Goal Status: Active Interventions: Assess fall risk on admission and as needed Notes: Electronic Signature(s) Signed: 03/24/2022 3:32:39 PM By: Adline Peals Entered By: Adline Peals on 03/24/2022 13:28:12 -------------------------------------------------------------------------------- Pain Assessment Details Patient Name: Date of Service: [name], Yann G. 03/24/2022 12:30 PM Medical Record Number: [id number] Patient Account Number: [id number] Date of Birth/Sex: Treating RN: 08-30-1935 (87 y.o. [name] Primary Care Letisha Yera: Haynes Hoehn Other Clinician: Referring Lezli Danek: Treating Farouk Vivero/Extender: Mellody Drown in Treatment: 0 Active Problems Location of Pain Severity and Description of Pain Patient Has Paino No Site Locations Rate the pain. MYKLE, MATZEN ([id number]) [id number] Page 6 of 8 Rate the pain. Current Pain Level: 0 Pain Management and Medication Current Pain  Management: Electronic Signature(s) Signed: 03/24/2022 3:32:39 PM By: Adline Peals Entered By: Adline Peals on 03/24/2022 12:57:33 -------------------------------------------------------------------------------- Patient/Caregiver Education Details Patient Name: Date of Service: [name], Tameem G. 2/13/2024andnbsp12:30 PM Medical Record Number: [id number] Patient Account Number: [id number] Date of Birth/Gender: Treating RN: [birth year] (87 y.o. [name] Primary Care Physician: Haynes Hoehn Other Clinician: Referring Physician: Treating Physician/Extender: Mellody Drown in Treatment: 0 Education Assessment Education Provided To: Patient Education Topics Provided Wound Debridement: Methods: Explain/Verbal Responses: Reinforcements needed, State content correctly Electronic Signature(s) Signed: 03/24/2022 3:32:39 PM By: Adline Peals Entered By: Adline Peals on 03/24/2022 13:28:21 -------------------------------------------------------------------------------- Wound Assessment Details Patient Name: Date of Service: [name], Dawon G. 03/24/2022 12:30 PM Medical Record Number: [id number] Patient Account Number: [id number] Date of Birth/Sex: Treating RN: 1935-04-26 (87 y.o. [name] Primary Care Leshea Jaggers: Haynes Hoehn Other Clinician: Referring Manami Tutor: Treating Zemirah Krasinski/Extender: Estin, Lamson, Dolan Darnell Level ([id number]) [id number] Page 7 of 8 Weeks in Treatment: 0 Wound Status Wound Number: 10 Primary T be determined o Etiology: Wound Location: Right T Great oe Wound Open Wounding Event: Gradually Appeared Status: Date Acquired: 02/12/2022 Comorbid Cataracts, Chronic Obstructive Pulmonary Disease (COPD), Weeks Of Treatment: 0 History: Coronary Artery Disease, Deep Vein Thrombosis, Hypertension, Clustered  Wound: No Peripheral Venous Disease, Osteoarthritis, Received  Chemotherapy, Received Radiation Photos Wound Measurements Length: (cm) 1 Width: (cm) 0.4 Depth: (cm) 0.1 Area: (cm) 0.314 Volume: (cm) 0.031 % Reduction in Area: % Reduction in Volume: Epithelialization: Small (1-33%) Tunneling: No Undermining: No Wound Description Classification: Full Thickness Without Exposed Support Structures Wound Margin: Distinct, outline attached Exudate Amount: Medium Exudate Type: Serous Exudate Color: amber Foul Odor After Cleansing: No Slough/Fibrino Yes Wound Bed Granulation Amount: Large (67-100%) Exposed Structure Granulation Quality: Red Fascia Exposed: No Necrotic Amount: Small (1-33%) Fat Layer (Subcutaneous Tissue) Exposed: Yes Necrotic Quality: Adherent Slough Tendon Exposed: No Muscle Exposed: No Joint Exposed: No Bone Exposed: No Periwound Skin Texture Texture Color No Abnormalities Noted: Yes No Abnormalities Noted: No Rubor: Yes Moisture No Abnormalities Noted: Yes Temperature / Pain Temperature: No Abnormality Treatment Notes Wound #10 (Toe Great) Wound Laterality: Right Cleanser Soap and Water Discharge Instruction: May shower and wash wound with dial antibacterial soap and water prior to dressing change. Wound Cleanser Discharge Instruction: Cleanse the wound with wound cleanser prior to applying a clean dressing using gauze sponges, not tissue or cotton balls. Peri-Wound Care Topical Primary Dressing Sorbalgon AG Dressing 2x2 (in/in) Discharge Instruction: Apply to wound bed as instructed [place], Malone ([id number] Page 8 of 8 Secondary Dressing Woven Gauze Sponges 2x2 in Discharge Instruction: Apply over primary dressing as directed. Secured With Conforming Stretch Gauze Bandage Roll, Sterile 4x75 (in/in) Discharge Instruction: Secure with stretch gauze as directed. Compression Wrap Compression Stockings Add-Ons Electronic Signature(s) Signed: 03/24/2022 3:32:39 PM By:  Adline Peals Entered By: Adline Peals on 03/24/2022 12:56:26 -------------------------------------------------------------------------------- Vitals Details Patient Name: Date of Service: [name], Akili G. 03/24/2022 12:30 PM Medical Record Number: [id number] Patient Account Number: [id number] Date of Birth/Sex: Treating RN: 1935-10-23 (87 y.o. [name] Primary Care Louanna Vanliew: Haynes Hoehn Other Clinician: Referring Taaj Hurlbut: Treating Jabarri Stefanelli/Extender: Mellody Drown in Treatment: 0 Vital Signs Time Taken: 12:45 Temperature (F): 98.1 Pulse (bpm): 70 Respiratory Rate (breaths/min): 18 Blood Pressure (mmHg): 155/71 Reference Range: 80 - 120 mg / dl Electronic Signature(s) Signed: 03/24/2022 3:32:39 PM By: Adline Peals Entered By: Adline Peals on 03/24/2022 12:45:45

## 2022-03-24 NOTE — Progress Notes (Signed)
JOSIAH, MANTEY ([id number]) [id number] Page 1 of 4 Visit Report for 03/24/2022 Abuse Risk Screen Details Patient Name: Date of Service: [name], [name]. 03/24/2022 12:30 PM Medical Record Number: [id number] Patient Account Number: [id number] Date of Birth/Sex: Treating RN: 23-Oct-1935 (87 y.o. [name] Primary Care Kadijah Shamoon: Haynes Hoehn Other Clinician: Referring Jianna Drabik: Treating Kelon Easom/Extender: Mellody Drown in Treatment: 0 Abuse Risk Screen Items Answer ABUSE RISK SCREEN: Has anyone close to you tried to hurt or harm you recentlyo No Do you feel uncomfortable with anyone in your familyo No Has anyone forced you do things that you didnt want to doo No Electronic Signature(s) Signed: 03/24/2022 3:32:39 PM By: Adline Peals Entered By: Adline Peals on 03/24/2022 12:46:17 -------------------------------------------------------------------------------- Activities of Daily Living Details Patient Name: Date of Service: [name], [name]. 03/24/2022 12:30 PM Medical Record Number: [id number] Patient Account Number: [id number] Date of Birth/Sex: Treating RN: Dec 11, 1935 (87 y.o. [name] Primary Care Tonya Wantz: Haynes Hoehn Other Clinician: Referring Rolene Andrades: Treating Rogan Wigley/Extender: Mellody Drown in Treatment: 0 Activities of Daily Living Items Answer Activities of Daily Living (Please select one for each item) Drive Automobile Completely Able T Medications ake Completely Able Use T elephone Completely Able Care for Appearance Need Assistance Use T oilet Completely Able Bath / Shower Need Assistance Dress Self Need Assistance Feed Self Completely Able Walk Need Assistance Get In / Out Bed Need Assistance Housework Need Assistance Prepare Meals Need Assistance Handle Money Need Assistance Shop for Self Need Assistance Electronic Signature(s) Signed: 03/24/2022  3:32:39 PM By: Adline Peals Entered By: Adline Peals on 03/24/2022 12:47:11 Turbin, Joseangel [name] ([id number].pdf Page 2 of 4 -------------------------------------------------------------------------------- Education Screening Details Patient Name: Date of Service: [name], [name]. 03/24/2022 12:30 PM Medical Record Number: [id number] Patient Account Number: [id number] Date of Birth/Sex: Treating RN: [birth year] (87 y.o. [name] Primary Care Khalfani Weideman: Haynes Hoehn Other Clinician: Referring Sherrye Puga: Treating Salli Bodin/Extender: Mellody Drown in Treatment: 0 Primary Learner Assessed: Patient Learning Preferences/Education Level/Primary Language Learning Preference: Explanation, Demonstration, Video, Printed Material Highest Education Level: College or Above Preferred Language: English Cognitive Barrier Language Barrier: No Translator Needed: No Memory Deficit: No Emotional Barrier: No Cultural/Religious Beliefs Affecting Medical Care: No Physical Barrier Impaired Vision: No Impaired Hearing: No Decreased Hand dexterity: No Knowledge/Comprehension Knowledge Level: Medium Comprehension Level: Medium Ability to understand written instructions: Medium Ability to understand verbal instructions: Medium Motivation Anxiety Level: Calm Cooperation: Cooperative Education Importance: Acknowledges Need Interest in Health Problems: Asks Questions Perception: Coherent Willingness to Engage in Self-Management Medium Activities: Readiness to Engage in Self-Management Medium Activities: Electronic Signature(s) Signed: 03/24/2022 3:32:39 PM By: Adline Peals Entered By: Adline Peals on 03/24/2022 12:47:51 -------------------------------------------------------------------------------- Fall Risk Assessment Details Patient Name: Date of Service: [name], [name]. 03/24/2022 12:30 PM Medical Record  Number: [id number] Patient Account Number: [id number] Date of Birth/Sex: Treating RN: Jan 15, 1936 (87 y.o. [name] Primary Care Merriam Brandner: Haynes Hoehn Other Clinician: Referring Mandy Fitzwater: Treating Quinn Bartling/Extender: Mellody Drown in Treatment: 0 Fall Risk Assessment Items Have you had 2 or more falls in the last 12 monthso 0 No [name], [name] ([id number]) [phone] [id number] Page 3 of 4 Have you had any fall that resulted in injury in the last 12 monthso 0 No FALLS RISK SCREEN History of falling - immediate or within 3 months 0 No Secondary diagnosis (Do you have 2 or more medical diagnoseso) 0 No Ambulatory aid None/bed rest/wheelchair/nurse 0 Yes Crutches/cane/walker  15 Yes Furniture 0 No Intravenous therapy Access/Saline/Heparin Lock 0 No Gait/Transferring Normal/ bed rest/ wheelchair 0 Yes Weak (short steps with or without shuffle, stooped but able to lift head while walking, may seek 10 Yes support from furniture) Impaired (short steps with shuffle, may have difficulty arising from chair, head down, impaired 0 No balance) Mental Status Oriented to own ability 0 Yes Electronic Signature(s) Signed: 03/24/2022 3:32:39 PM By: Adline Peals Entered By: Adline Peals on 03/24/2022 12:48:10 -------------------------------------------------------------------------------- Foot Assessment Details Patient Name: Date of Service: [name], [name]. 03/24/2022 12:30 PM Medical Record Number: [id number] Patient Account Number: [id number] Date of Birth/Sex: Treating RN: [birth year] (87 y.o. [name] Primary Care Arcelia Pals: Haynes Hoehn Other Clinician: Referring Melek Pownall: Treating Robey Massmann/Extender: Mellody Drown in Treatment: 0 Foot Assessment Items Site Locations + = Sensation present, - = Sensation absent, C = Callus, U = Ulcer R = Redness, W = Warmth, M = Maceration, PU = Pre-ulcerative  lesion F = Fissure, S = Swelling, D = Dryness Assessment Right: Left: Other Deformity: No No Prior Foot Ulcer: No No Prior Amputation: No No Charcot Joint: No No Ambulatory Status: Ambulatory With Help Assistance Device: [name], [name] ([id number]) [id number] Page 4 of 4 Gait: Steady Electronic Signature(s) Signed: 03/24/2022 3:32:39 PM By: Adline Peals Entered By: Adline Peals on 03/24/2022 12:49:26 -------------------------------------------------------------------------------- Nutrition Risk Screening Details Patient Name: Date of Service: [name], [name]. 03/24/2022 12:30 PM Medical Record Number: [id number] Patient Account Number: [id number] Date of Birth/Sex: Treating RN: 1935-08-12 (87 y.o. [name] Primary Care Tynlee Bayle: Haynes Hoehn Other Clinician: Referring Marriana Hibberd: Treating Tarl Cephas/Extender: Mellody Drown in Treatment: 0 Height (in): Weight (lbs): Body Mass Index (BMI): Nutrition Risk Screening Items Score Screening NUTRITION RISK SCREEN: I have an illness or condition that made me change the kind and/or amount of food I eat 0 No I eat fewer than two meals per day 0 No I eat few fruits and vegetables, or milk products 0 No I have three or more drinks of beer, liquor or wine almost every day 0 No I have tooth or mouth problems that make it hard for me to eat 0 No I don't always have enough money to buy the food I need 0 No I eat alone most of the time 0 No I take three or more different prescribed or over-the-counter drugs a day 1 Yes Without wanting to, I have lost or gained 10 pounds in the last six months 0 No I am not always physically able to shop, cook and/or feed myself 0 No Nutrition Protocols Good Risk Protocol 0 No interventions needed Moderate Risk Protocol High Risk Proctocol Risk Level: Good Risk Score: 1 Electronic Signature(s) Signed: 03/24/2022 3:32:39 PM  By: Adline Peals Entered By: Adline Peals on 03/24/2022 12:49:00

## 2022-04-08 ENCOUNTER — Encounter (HOSPITAL_BASED_OUTPATIENT_CLINIC_OR_DEPARTMENT_OTHER): Payer: Medicare Other | Admitting: General Surgery

## 2022-04-08 DIAGNOSIS — B353 Tinea pedis: Secondary | ICD-10-CM | POA: Diagnosis not present

## 2022-04-09 NOTE — Progress Notes (Signed)
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 Chronic sinus problems/congestion; Middle ear problems Hematologic/Lymphatic Medical  History: Negative for: Anemia; Hemophilia; Human Immunodeficiency Virus; Lymphedema; Sickle Cell Disease Respiratory Medical History: Positive for: Chronic Obstructive Pulmonary Disease (COPD) Negative for: Aspiration; Asthma; Pneumothorax; Sleep Apnea; Tuberculosis Cardiovascular Medical History: Positive for: Coronary Artery Disease; Deep Vein Thrombosis; Hypertension; Peripheral Venous Disease Negative for: Angina; Arrhythmia; Congestive Heart Failure; Hypotension; Myocardial Infarction; Peripheral Arterial Disease; Phlebitis; Vasculitis Gastrointestinal Medical History: Negative for: Cirrhosis ; Colitis; Crohns; Hepatitis A; Hepatitis B; Hepatitis C Endocrine Medical History: Negative for: Type I Diabetes; Type II Diabetes Genitourinary Medical History: Negative for: End Stage Renal Disease Past Medical History Notes: enlarged prostate Immunological Medical History: Negative for: Lupus Erythematosus; Raynauds; Scleroderma Integumentary (Skin) Medical History: Negative for: History of Burn Musculoskeletal Medical History: Positive for: Osteoarthritis Negative for: Gout; Rheumatoid Arthritis; Osteomyelitis Neurologic Medical History: Negative for: Dementia; Neuropathy; Quadriplegia; Paraplegia; Seizure Disorder Oncologic Medical History: AAYAN, [name] ([id number]) [id number] Page 11 of 12 Positive for: Received Chemotherapy -  2015; Received Radiation - 2015 Past Medical History Notes: hx colon CA Psychiatric Medical History: Negative for: Anorexia/bulimia; Confinement Anxiety HBO Extended History Items Eyes: Cataracts Immunizations Pneumococcal Vaccine: Received Pneumococcal Vaccination: Yes Received Pneumococcal Vaccination On or After 60th Birthday: Yes Implantable Devices None Hospitalization / Surgery History Type of Hospitalization/Surgery colon resection umbilical hernia repair Family and Social History Cancer: No;  Diabetes: No; Heart Disease: No; Hereditary Spherocytosis: No; Hypertension: No; Kidney Disease: No; Lung Disease: No; Seizures: No; Stroke: No; Thyroid Problems: No; Tuberculosis: No; Former smoker; Marital Status - Married; Alcohol Use: Rarely; Drug Use: No History; Caffeine Use: Daily; Financial Concerns: No; Food, Clothing or Shelter Needs: No; Support System Lacking: No; Transportation Concerns: No Engineer, maintenance) Signed: 04/08/2022 4:05:52 PM By: Baruch [name] RN, BSN Signed: 04/08/2022 4:13:19 PM By: [name] MD FACS Entered By: [name] on 04/08/2022 13:51:28 -------------------------------------------------------------------------------- SuperBill Details Patient Name: Date of Service: [name], [name]. 04/08/2022 Medical Record Number: [id number] Patient Account Number: [id number] Date of Birth/Sex: Treating RN: 29-Jan-1936 (87 y.o. [name] Primary Care Provider: Haynes Hoehn Other Clinician: Referring Provider: Treating Provider/Extender: Mellody Drown in Treatment: 2 Diagnosis Coding ICD-10 Codes Code Description [phone] Non-pressure chronic ulcer of other part of right lower leg with fat layer exposed B35.3 Tinea pedis J44.9 Chronic obstructive pulmonary disease, unspecified D69.2 Other nonthrombocytopenic purpura I10 Essential (primary) hypertension I87.8 Other specified disorders of veins Facility Procedures : CPT4 Code: [id number] Description: [id number] - DEBRIDE WOUND 1ST 20 SQ CM OR < ICD-10 Diagnosis Description L97.812 Non-pressure chronic ulcer of other part of right lower leg with fat layer expos Modifier: ed Quantity: 1 Physician Procedures : CPT4 Code Description Modifier [id number] - WC PHYS LEVEL 4 - EST PT 25 Haese, Anirudh [name] ([id number]) [id number]. ICD-10 Diagnosis Description [id number] Non-pressure chronic ulcer of other part of right lower leg with fat layer  exposed I87.8 Other  specified disorders of veins J44.9 Chronic obstructive pulmonary disease, unspecified I10 Essential (primary) hypertension Quantity: 1 pdf Page 12 of 12 : [id number] - WC PHYS DEBR WO ANESTH 20 SQ CM 1 ICD-10 Diagnosis Description [id number] Non-pressure chronic ulcer of other part of right lower leg with fat layer exposed Quantity: Electronic Signature(s) Signed: 04/08/2022 2:04:40 PM By: [name] MD FACS Entered By: [name] on 04/08/2022 14:04:40

## 2022-04-09 NOTE — Progress Notes (Signed)
[name], [name] ([id number]) [id number] Page 1 of 8 Visit Report for 04/08/2022 Arrival Information Details Patient Name: Date of Service: [name], [name]. 04/08/2022 1:15 PM Medical Record Number: [id number] Patient Account Number: [id number] Date of Birth/Sex: Treating RN: 10-Sep-1935 (87 y.o. M) Primary Care [name]: [name] Other Clinician: Referring [name]: Treating [name]/Extender: [name] in Treatment: 2 Visit Information History Since Last Visit All ordered tests and consults were completed: No Patient Arrived: Wheel Chair Added or deleted any medications: No Arrival Time: 13:09 Any new allergies or adverse reactions: No Accompanied By: wife Had a fall or experienced change in No Transfer Assistance: None activities of daily living that may affect Patient Identification Verified: Yes risk of falls: Secondary Verification Process Completed: Yes Signs or symptoms of abuse/neglect since last visito No Patient Requires Transmission-Based Precautions: No Hospitalized since last visit: No Patient Has Alerts: No Implantable device outside of the clinic excluding No cellular tissue based products placed in the center since last visit: Has Dressing in Place as Prescribed: Yes Has Compression in Place as Prescribed: Yes Pain Present Now: No Electronic Signature(s) Signed: 04/08/2022 4:05:52 PM By: Baruch [name] RN, BSN Entered By: Baruch [name] on 04/08/2022 13:25:22 -------------------------------------------------------------------------------- Compression Therapy Details Patient Name: Date of Service: [name], [name]. 04/08/2022 1:15 PM Medical Record Number: [id number] Patient Account Number: [id number] Date of Birth/Sex: Treating RN: 1935-10-20 (87 y.o. [name] Primary Care [name]: [name] Other Clinician: Referring [name]: Treating [name]/Extender: [name] in  Treatment: 2 Compression Therapy Performed for Wound Assessment: Wound #11 Right,Lateral Lower Leg Performed By: Clinician Baruch [name], RN Compression Type: Three Layer Post Procedure Diagnosis Same as Pre-procedure Electronic Signature(s) Signed: 04/08/2022 4:05:52 PM By: Baruch [name] RN, BSN Entered By: Baruch [name] on 04/08/2022 13:46:43 -------------------------------------------------------------------------------- Encounter Discharge Information Details Patient Name: Date of Service: [name], [name] 04/08/2022 1:15 PM Medical Record Number: [id number] Patient Account Number: [id number] Date of Birth/Sex: Treating RN: 09-27-1935 (87 y.o. [name] Primary Care [name]: [name] Other Clinician: Referring [name]: Treating [name]/Extender: [name] in Treatment: 2 Encounter Discharge Information Items Post Procedure Vitals Discharge Condition: Stable Temperature (F): 97.8 Ambulatory Status: Wheelchair Pulse (bpm): 66 Discharge Destination: Home Respiratory Rate (breaths/min): 18 [name], [name] ([id number]) [id number] Page 2 of 8 Transportation: Private Auto Blood Pressure (mmHg): 162/70 Accompanied By: spouse Schedule Follow-up Appointment: Yes Clinical Summary of Care: Patient Declined Electronic Signature(s) Signed: 04/08/2022 4:05:52 PM By: Baruch [name] RN, BSN Entered By: Baruch [name] on 04/08/2022 14:05:27 -------------------------------------------------------------------------------- Lower Extremity Assessment Details Patient Name: Date of Service: [name], [name]. 04/08/2022 1:15 PM Medical Record Number: [id number] Patient Account Number: [id number] Date of Birth/Sex: Treating RN: 10-11-1935 (87 y.o. [name] Primary Care [name]: [name] Other Clinician: Referring [name]: Treating [name]/Extender: [name] in Treatment: 2 Edema  Assessment Assessed: [name]: No] [name]: No] [Left: Edema] [Right: :] Calf Left: Right: Point of Measurement: From Medial Instep 39.5 cm Ankle Left: Right: Point of Measurement: From Medial Instep 27 cm Vascular Assessment Pulses: Dorsalis Pedis Palpable: [Right:Yes] Electronic Signature(s) Signed: 04/08/2022 4:05:52 PM By: Baruch [name] RN, BSN Entered By: Baruch [name] on 04/08/2022 13:28:55 -------------------------------------------------------------------------------- Multi Wound Chart Details Patient Name: Date of Service: [name], [name]. 04/08/2022 1:15 PM Medical Record Number: [id number] Patient Account Number: [id number] Date of Birth/Sex: Treating RN: [birth year] (87 y.o. [name] Primary Care Domanic Matusek: [name] Other Clinician: Referring [name]: Treating [name]/Extender: [name]  [name] in Treatment: 2 Vital Signs Height(in): 75 Pulse(bpm): 66 Weight(lbs): 257 Blood Pressure(mmHg): 162/70 Body Mass Index(BMI): 32.1 Temperature(F): 97.8 Respiratory Rate(breaths/min): 20 [10:Photos:] [N/A:N/A [id number] Page 3 of 8] Right T Great oe Right, Lateral Lower Leg N/A Wound Location: Gradually Appeared Gradually Appeared N/A Wounding Event: T be determined o Venous Leg Ulcer N/A Primary Etiology: Cataracts, Chronic Obstructive Cataracts, Chronic Obstructive N/A Comorbid History: Pulmonary Disease (COPD), Coronary Pulmonary Disease (COPD), Coronary Artery Disease, Deep Vein Artery Disease, Deep Vein Thrombosis, Hypertension, Peripheral Thrombosis, Hypertension, Peripheral Venous Disease, Osteoarthritis, Venous Disease, Osteoarthritis, Received Chemotherapy, Received Received Chemotherapy, Received Radiation Radiation 02/12/2022 04/03/2022 N/A Date Acquired: 2 0 N/A Weeks of Treatment: Healed - Epithelialized Open N/A Wound Status: No No N/A Wound Recurrence: 0x0x0 2.1x1x0.1 N/A Measurements L  x W x D (cm) 0 1.649 N/A A (cm) : rea 0 0.165 N/A Volume (cm) : 100.00% N/A N/A % Reduction in A rea: 100.00% N/A N/A % Reduction in Volume: Full Thickness Without Exposed Full Thickness Without Exposed N/A Classification: Support Structures Support Structures None Present Medium N/A Exudate A mount: N/A Serous N/A Exudate Type: N/A amber N/A Exudate Color: N/A Flat and Intact N/A Wound Margin: None Present (0%) Medium (34-66%) N/A Granulation A mount: N/A Pink N/A Granulation Quality: None Present (0%) Medium (34-66%) N/A Necrotic A mount: Fascia: No Fat Layer (Subcutaneous Tissue): Yes N/A Exposed Structures: Fat Layer (Subcutaneous Tissue): No Fascia: No Tendon: No Tendon: No Muscle: No Muscle: No Joint: No Joint: No Bone: No Bone: No Large (67-100%) Small (1-33%) N/A Epithelialization: N/A Debridement - Selective/Open Wound N/A Debridement: Pre-procedure Verification/Time Out N/A 13:40 N/A Taken: N/A Lidocaine 4% Topical Solution N/A Pain Control: N/A Slough N/A Tissue Debrided: N/A Non-Viable Tissue N/A Level: N/A 2.1 N/A Debridement A (sq cm): rea N/A Curette N/A Instrument: N/A Minimum N/A Bleeding: N/A Pressure N/A Hemostasis A chieved: N/A 0 N/A Procedural Pain: N/A 0 N/A Post Procedural Pain: N/A Procedure was tolerated well N/A Debridement Treatment Response: N/A 2.1x1x0.1 N/A Post Debridement Measurements L x W x D (cm) N/A 0.165 N/A Post Debridement Volume: (cm) No Abnormalities Noted No Abnormalities Noted N/A Periwound Skin Texture: Dry/Scaly: Yes No Abnormalities Noted N/A Periwound Skin Moisture: Maceration: No Rubor: Yes Hemosiderin Staining: Yes N/A Periwound Skin Color: No Abnormality No Abnormality N/A Temperature: N/A Compression Therapy N/A Procedures Performed: Debridement Treatment Notes Electronic Signature(s) Signed: 04/08/2022 1:47:48 PM By: [name] Maudlin MD FACS Signed: 04/08/2022 4:05:52 PM By:  Baruch [name] RN, BSN Entered By: [name] on 04/08/2022 13:47:48 -------------------------------------------------------------------------------- Multi-Disciplinary Care Plan Details Patient Name: Date of Service: [name], [name]. 04/08/2022 1:15 PM Medical Record Number: [id number] Patient Account Number: [id number] Date of Birth/Sex: Treating RN: Oct 14, 1935 (87 y.o. [name] Primary Care Priscillia Fouch: [name] Other Clinician: Referring Neyah Ellerman: Treating Kalifa Cadden/Extender: [name] in Treatment: 2 Rochon, Kahleb [name] ([id number]) [id number] Page 4 of 8 Multidisciplinary Care Plan reviewed with physician Active Inactive Abuse / Safety / Falls / Self Care Management Nursing Diagnoses: Impaired physical mobility Potential for falls Goals: Patient/caregiver will verbalize understanding of skin care regimen Date Initiated: 03/24/2022 Target Resolution Date: 05/15/2022 Goal Status: Active Patient/caregiver will verbalize/demonstrate measures taken to improve the patient's personal safety Date Initiated: 03/24/2022 Target Resolution Date: 05/15/2022 Goal Status: Active Interventions: Assess fall risk on admission and as needed Notes: Venous Leg Ulcer Nursing Diagnoses: Knowledge deficit related to disease process and management Potential for venous Insuffiency (use before diagnosis confirmed) Goals: Patient will maintain optimal edema  control Date Initiated: 04/08/2022 Target Resolution Date: 05/06/2022 Goal Status: Active Interventions: Assess peripheral edema status every visit. Compression as ordered Treatment Activities: Therapeutic compression applied : 04/08/2022 Notes: Wound/Skin Impairment Nursing Diagnoses: Impaired tissue integrity Knowledge deficit related to ulceration/compromised skin integrity Goals: Patient/caregiver will verbalize understanding of skin care regimen Date Initiated:  04/08/2022 Target Resolution Date: 05/06/2022 Goal Status: Active Ulcer/skin breakdown will have a volume reduction of 30% by week 4 Date Initiated: 04/08/2022 Target Resolution Date: 05/06/2022 Goal Status: Active Interventions: Assess patient/caregiver ability to obtain necessary supplies Assess patient/caregiver ability to perform ulcer/skin care regimen upon admission and as needed Assess ulceration(s) every visit Provide education on ulcer and skin care Treatment Activities: Skin care regimen initiated : 04/08/2022 Topical wound management initiated : 04/08/2022 Notes: Electronic Signature(s) Signed: 04/08/2022 4:05:52 PM By: Baruch [name] RN, BSN Entered By: Baruch [name] on 04/08/2022 13:36:54 [name], [name] ([id number]) [id number] Page 5 of 8 -------------------------------------------------------------------------------- Pain Assessment Details Patient Name: Date of Service: [name], Rahmel [name]. 04/08/2022 1:15 PM Medical Record Number: [id number] Patient Account Number: [id number] Date of Birth/Sex: Treating RN: 04/10/1935 (88 y.o. M) Primary Care Randen Kauth: [name] Other Clinician: Referring Krishan Mcbreen: Treating Zipporah Finamore/Extender: [name] in Treatment: 2 Active Problems Location of Pain Severity and Description of Pain Patient Has Paino Yes Site Locations Pain Location: Generalized Pain With Dressing Change: No Duration of the Pain. Constant / Intermittento Constant Rate the pain. Current Pain Level: 3 Worst Pain Level: 10 Least Pain Level: 0 Tolerable Pain Level: 1 Character of Pain Describe the Pain: Aching Pain Management and Medication Current Pain Management: Medication: Yes Is the Current Pain Management Adequate: Adequate How does your wound impact your activities of daily livingo Sleep: No Bathing: No Appetite: No Relationship With Others: No Bladder Continence: No Emotions: No Bowel Continence:  No Work: No Toileting: No Drive: No Dressing: No Hobbies: No Engineer, maintenance) Signed: 04/08/2022 4:05:52 PM By: Baruch [name] RN, BSN Entered By: Baruch [name] on 04/08/2022 13:27:56 -------------------------------------------------------------------------------- Patient/Caregiver Education Details Patient Name: Date of Service: [name], [name]. 2/28/2024andnbsp1:15 PM Medical Record Number: [id number] Patient Account Number: [id number] Date of Birth/Gender: Treating RN: 08-Sep-1935 (87 y.o. [name] Primary Care Physician: [name] Other Clinician: Referring Physician: Treating Physician/Extender: [name] in Treatment: 2 Education Assessment Education Provided To: Patient Education Topics Provided Venous: Methods: Explain/Verbal Responses: Reinforcements needed, State content correctly [name], [name] ([id number]) [id number] Page 6 of 8 Wound/Skin Impairment: Methods: Explain/Verbal Responses: Reinforcements needed, State content correctly Electronic Signature(s) Signed: 04/08/2022 4:05:52 PM By: Baruch [name] RN, BSN Entered By: Baruch [name] on 04/08/2022 13:37:20 -------------------------------------------------------------------------------- Wound Assessment Details Patient Name: Date of Service: [name], [name]. 04/08/2022 1:15 PM Medical Record Number: [id number] Patient Account Number: [id number] Date of Birth/Sex: Treating RN: May 13, 1935 (87 y.o. [name] Primary Care Jaylynn Siefert: [name] Other Clinician: Referring Leroy Pettway: Treating Judas Mohammad/Extender: [name] in Treatment: 2 Wound Status Wound Number: 10 Primary T be determined o Etiology: Wound Location: Right T Great oe Wound Healed - Epithelialized Wounding Event: Gradually Appeared Status: Date Acquired: 02/12/2022 Comorbid Cataracts, Chronic Obstructive Pulmonary Disease (COPD), Weeks  Of Treatment: 2 History: Coronary Artery Disease, Deep Vein Thrombosis, Hypertension, Clustered Wound: No Peripheral Venous Disease, Osteoarthritis, Received Chemotherapy, Received Radiation Photos Wound Measurements Length: (cm) Width: (cm) Depth: (cm) Area: (cm) Volume: (cm) 0 % Reduction in Area: 100% 0 % Reduction in Volume: 100% 0 Epithelialization: Large (67-100%) 0 Tunneling: No 0 Undermining: No Wound  Description Classification: Full Thickness Without Exposed Support Structures Exudate Amount: None Present Foul Odor After Cleansing: No Slough/Fibrino No Wound Bed Granulation Amount: None Present (0%) Exposed Structure Necrotic Amount: None Present (0%) Fascia Exposed: No Fat Layer (Subcutaneous Tissue) Exposed: No Tendon Exposed: No Muscle Exposed: No Joint Exposed: No Bone Exposed: No Periwound Skin Texture Texture Color No Abnormalities Noted: Yes No Abnormalities Noted: Yes Moisture Temperature / Pain No Abnormalities Noted: No Temperature: No Abnormality Dry / Scaly: Yes [name], [name] ([id number]) [id number] Page 7 of 8 Maceration: No Electronic Signature(s) Signed: 04/08/2022 4:05:52 PM By: Baruch [name] RN, BSN Entered By: Baruch [name] on 04/08/2022 13:39:12 -------------------------------------------------------------------------------- Wound Assessment Details Patient Name: Date of Service: [name], [name]. 04/08/2022 1:15 PM Medical Record Number: [id number] Patient Account Number: [id number] Date of Birth/Sex: Treating RN: 11/20/1935 (87 y.o. [name] Primary Care Ashlynne Shetterly: [name] Other Clinician: Referring Alanis Clift: Treating Quiara Killian/Extender: [name] in Treatment: 2 Wound Status Wound Number: 11 Primary Venous Leg Ulcer Etiology: Wound Location: Right, Lateral Lower Leg Wound Open Wounding Event: Gradually Appeared Status: Date Acquired: 04/03/2022 Comorbid  Cataracts, Chronic Obstructive Pulmonary Disease (COPD), Weeks Of Treatment: 0 History: Coronary Artery Disease, Deep Vein Thrombosis, Hypertension, Clustered Wound: No Peripheral Venous Disease, Osteoarthritis, Received Chemotherapy, Received Radiation Photos Wound Measurements Length: (cm) 2.1 Width: (cm) 1 Depth: (cm) 0.1 Area: (cm) 1.649 Volume: (cm) 0.165 % Reduction in Area: % Reduction in Volume: Epithelialization: Small (1-33%) Tunneling: No Undermining: No Wound Description Classification: Full Thickness Without Exposed Support Structures Wound Margin: Flat and Intact Exudate Amount: Medium Exudate Type: Serous Exudate Color: amber Foul Odor After Cleansing: No Slough/Fibrino Yes Wound Bed Granulation Amount: Medium (34-66%) Exposed Structure Granulation Quality: Pink Fascia Exposed: No Necrotic Amount: Medium (34-66%) Fat Layer (Subcutaneous Tissue) Exposed: Yes Necrotic Quality: Adherent Slough Tendon Exposed: No Muscle Exposed: No Joint Exposed: No Bone Exposed: No Periwound Skin Texture Texture Color No Abnormalities Noted: Yes No Abnormalities Noted: No Hemosiderin Staining: Yes Moisture No Abnormalities Noted: Yes Temperature / Pain Temperature: No Abnormality [name], [name] ([id number] Page 8 of 8 Treatment Notes Wound #11 (Lower Leg) Wound Laterality: Right, Lateral Cleanser Peri-Wound Care Ketoconazole Cream 2% Discharge Instruction: Apply Ketoconazole to foot Sween Lotion (Moisturizing lotion) Discharge Instruction: Apply moisturizing lotion as directed Topical Primary Dressing Maxorb Extra Ag+ Alginate Dressing, 2x2 (in/in) Discharge Instruction: Apply to wound bed as instructed Secondary Dressing Woven Gauze Sponge, Non-Sterile 4x4 in Discharge Instruction: Apply over primary dressing as directed. Secured With Compression Wrap ThreePress (3 layer compression wrap) Discharge Instruction: Apply  three layer compression as directed. Compression Stockings Add-Ons Electronic Signature(s) Signed: 04/08/2022 4:05:52 PM By: Baruch [name] RN, BSN Entered By: Baruch [name] on 04/08/2022 13:39:31 -------------------------------------------------------------------------------- Vitals Details Patient Name: Date of Service: [name], [name]. 04/08/2022 1:15 PM Medical Record Number: [id number] Patient Account Number: [id number] Date of Birth/Sex: Treating RN: [birth year] (87 y.o. M) Primary Care Kemon Devincenzi: [name] Other Clinician: Referring Donnae Michels: Treating Makela Niehoff/Extender: [name] in Treatment: 2 Vital Signs Time Taken: 13:09 Temperature (F): 97.8 Height (in): 75 Pulse (bpm): 66 Weight (lbs): 257 Respiratory Rate (breaths/min): 20 Body Mass Index (BMI): 32.1 Blood Pressure (mmHg): 162/70 Reference Range: 80 - 120 mg / dl Electronic Signature(s) Signed: 04/08/2022 4:05:52 PM By: Baruch [name] RN, BSN Entered By: Baruch [name] on 04/08/2022 13:27:21

## 2022-04-14 ENCOUNTER — Telehealth: Payer: Self-pay | Admitting: Pulmonary Disease

## 2022-04-14 NOTE — Telephone Encounter (Signed)
Wife calling because of missed call to sched CT. Please try her @ [phone]

## 2022-04-14 NOTE — Telephone Encounter (Signed)
Called wife and made gave apmt info for CT

## 2022-04-15 ENCOUNTER — Ambulatory Visit (HOSPITAL_BASED_OUTPATIENT_CLINIC_OR_DEPARTMENT_OTHER): Payer: Medicare Other | Admitting: General Surgery

## 2022-04-16 ENCOUNTER — Encounter (HOSPITAL_BASED_OUTPATIENT_CLINIC_OR_DEPARTMENT_OTHER): Payer: Medicare Other | Attending: General Surgery | Admitting: General Surgery

## 2022-04-16 DIAGNOSIS — M1990 Unspecified osteoarthritis, unspecified site: Secondary | ICD-10-CM | POA: Insufficient documentation

## 2022-04-16 DIAGNOSIS — I890 Lymphedema, not elsewhere classified: Secondary | ICD-10-CM | POA: Insufficient documentation

## 2022-04-16 DIAGNOSIS — L97812 Non-pressure chronic ulcer of other part of right lower leg with fat layer exposed: Secondary | ICD-10-CM | POA: Diagnosis present

## 2022-04-16 DIAGNOSIS — J449 Chronic obstructive pulmonary disease, unspecified: Secondary | ICD-10-CM | POA: Insufficient documentation

## 2022-04-16 DIAGNOSIS — D692 Other nonthrombocytopenic purpura: Secondary | ICD-10-CM | POA: Insufficient documentation

## 2022-04-16 DIAGNOSIS — I872 Venous insufficiency (chronic) (peripheral): Secondary | ICD-10-CM | POA: Diagnosis not present

## 2022-04-16 DIAGNOSIS — I714 Abdominal aortic aneurysm, without rupture, unspecified: Secondary | ICD-10-CM | POA: Diagnosis not present

## 2022-04-16 DIAGNOSIS — Z923 Personal history of irradiation: Secondary | ICD-10-CM | POA: Insufficient documentation

## 2022-04-16 DIAGNOSIS — I10 Essential (primary) hypertension: Secondary | ICD-10-CM | POA: Diagnosis not present

## 2022-04-16 DIAGNOSIS — Z87891 Personal history of nicotine dependence: Secondary | ICD-10-CM | POA: Insufficient documentation

## 2022-04-16 DIAGNOSIS — Z85038 Personal history of other malignant neoplasm of large intestine: Secondary | ICD-10-CM | POA: Insufficient documentation

## 2022-04-16 DIAGNOSIS — I878 Other specified disorders of veins: Secondary | ICD-10-CM | POA: Insufficient documentation

## 2022-04-16 DIAGNOSIS — B353 Tinea pedis: Secondary | ICD-10-CM | POA: Insufficient documentation

## 2022-04-16 DIAGNOSIS — I2510 Atherosclerotic heart disease of native coronary artery without angina pectoris: Secondary | ICD-10-CM | POA: Insufficient documentation

## 2022-04-18 NOTE — Progress Notes (Signed)
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 Burn Musculoskeletal Medical History: Positive for: Osteoarthritis Negative for: Gout; Rheumatoid Arthritis; Osteomyelitis Neurologic Medical History: Negative for: Dementia; Neuropathy; Quadriplegia; Paraplegia; Seizure  Disorder Oncologic Medical History: Positive for: Received Chemotherapy - 2015; Received Radiation - 2015 Past Medical History Notes: hx colon CA Psychiatric Medical History: Negative for: Anorexia/bulimia; Confinement Anxiety HBO Extended History Items Eyes: Cataracts [name], [name] ([id number]) [id number] Page 11 of 12 Immunizations Pneumococcal Vaccine: Received Pneumococcal Vaccination: Yes Received Pneumococcal Vaccination On or After 60th Birthday: Yes Implantable Devices None Hospitalization / Surgery History Type of Hospitalization/Surgery colon resection umbilical hernia repair Family and Social History Cancer: No; Diabetes: No; Heart Disease: No; Hereditary Spherocytosis: No; Hypertension: No; Kidney Disease: No; Lung Disease: No; Seizures: No; Stroke: No; Thyroid Problems: No; Tuberculosis: No; Former smoker; Marital Status - Married; Alcohol Use: Rarely; Drug Use: No History; Caffeine Use: Daily; Financial Concerns: No; Food, Clothing or Shelter Needs: No; Support System Lacking: No; Transportation Concerns: No Electronic Signature(s) Signed: 04/17/2022 7:58:56 AM By: [name] MD FACS Entered By: [name] on 04/17/2022 07:42:08 -------------------------------------------------------------------------------- SuperBill Details Patient Name: Date of Service: [name], Sid [name]. 04/16/2022 Medical Record Number: [id number] Patient Account Number: [id number] Date of Birth/Sex: Treating RN: [birth year] (87 y.o. 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 I10 Essential  (primary) hypertension I87.8 Other specified disorders of veins Facility Procedures : CPT4 Code: [id number] Description: [id number] - DEBRIDE WOUND 1ST 20 SQ CM OR < ICD-10 Diagnosis Description L97.812 Non-pressure chronic ulcer of other part of right lower leg with fat layer expos Modifier: ed Quantity: 1 Physician Procedures : CPT4 Code Description Modifier [id number] - WC PHYS LEVEL 3 - EST PT 25 ICD-10 Diagnosis Description L97.812 Non-pressure chronic ulcer of other part of right lower leg with fat layer exposed J44.9 Chronic obstructive pulmonary disease, unspecified  I87.8 Other specified disorders of veins 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

## 2022-04-18 NOTE — Progress Notes (Addendum)
[name], [name] ([id number]) [id number] Page 1 of 7 Visit Report for 04/16/2022 Arrival Information Details Patient Name: Date of Service: [name], [name] 04/16/2022 2:45 PM Medical Record Number: [id number] Patient Account Number: [id number] Date of Birth/Sex: Treating RN: 06-09-1935 (87 y.o. [name] Primary Care Brock Mokry: Haynes Hoehn Other Clinician: Referring Dave Mergen: Treating Annarose Ouellet/Extender: Mellody Drown in Treatment: 3 Visit Information History Since Last Visit Added or deleted any medications: No Patient Arrived: Wheel Chair Any new allergies or adverse reactions: No Arrival Time: 14:55 Had a fall or experienced change in No Accompanied By: spouse activities of daily living that may affect Transfer Assistance: None risk of falls: Patient Identification Verified: Yes Signs or symptoms of abuse/neglect since last visito No Secondary Verification Process Completed: Yes Hospitalized since last visit: No Patient Requires Transmission-Based Precautions: No Implantable device outside of the clinic excluding No Patient Has Alerts: No cellular tissue based products placed in the center since last visit: Has Dressing in Place as Prescribed: Yes Has Compression in Place as Prescribed: Yes Pain Present Now: No Electronic Signature(s) Signed: 04/16/2022 5:28:22 PM By: Baruch [name] RN, BSN Entered By: Baruch [name] on 04/16/2022 15:00:36 -------------------------------------------------------------------------------- Compression Therapy Details Patient Name: Date of Service: [name], [name] 04/16/2022 2:45 PM Medical Record Number: [id number] Patient Account Number: [id number] Date of Birth/Sex: Treating RN: [birth year] (87 y.o. [name] Primary Care [name]: Haynes Hoehn Other Clinician: Referring [name]: Treating [name]/Extender: Mellody Drown in Treatment: 3 Compression Therapy  Performed for Wound Assessment: Wound #11 Right,Lateral Lower Leg Performed By: Clinician Baruch [name], RN Compression Type: Three Layer Post Procedure Diagnosis Same as Pre-procedure Electronic Signature(s) Signed: 04/16/2022 5:28:22 PM By: Baruch [name] RN, BSN Entered By: Baruch [name] on 04/16/2022 15:15:56 -------------------------------------------------------------------------------- Lower Extremity Assessment Details Patient Name: Date of Service: [name], [name] 04/16/2022 2:45 PM Medical Record Number: [id number] Patient Account Number: [id number] Date of Birth/Sex: Treating RN: [birth year] (87 y.o. [name] Primary Care [name]: Haynes Hoehn Other Clinician: Referring [name]: Treating Sharen Youngren/Extender: Mellody Drown in Treatment: 3 Edema Assessment Assessed: [name]: No] [Right: No] Edema: [Left: Ye] [Right: s] S[Left: ANDO, Kendell G [id number] [Right: [id number] Page 2 of 7] Calf Left: Right: Point of Measurement: From Medial Instep 38.5 cm Ankle Left: Right: Point of Measurement: From Medial Instep 26 cm Vascular Assessment Pulses: Dorsalis Pedis Palpable: [Right:Yes] Electronic Signature(s) Signed: 04/16/2022 5:28:22 PM By: Baruch [name] RN, BSN Entered By: Baruch [name] on 04/16/2022 15:05:50 -------------------------------------------------------------------------------- Multi Wound Chart Details Patient Name: Date of Service: [name], [name] 04/16/2022 2:45 PM Medical Record Number: [id number] Patient Account Number: [id number] Date of Birth/Sex: Treating RN: 16-Oct-1935 (87 y.o. M) Primary Care [name]: Haynes Hoehn Other Clinician: Referring [name]: Treating [name]/Extender: Mellody Drown in Treatment: 3 Vital Signs Height(in): 75 Pulse(bpm): 73 Weight(lbs): 10 Blood Pressure(mmHg): 165/74 Body Mass Index(BMI): 32.1 Temperature(F): 97.9 Respiratory  Rate(breaths/min): 20 [11:Photos:] [N/A:N/A] Right, Lateral Lower Leg N/A N/A Wound Location: Gradually Appeared N/A N/A Wounding Event: Venous Leg Ulcer N/A N/A Primary Etiology: Cataracts, Chronic Obstructive N/A N/A Comorbid History: Pulmonary Disease (COPD), Coronary Artery Disease, Deep Vein Thrombosis, Hypertension, Peripheral Venous Disease, Osteoarthritis, Received Chemotherapy, Received Radiation 04/03/2022 N/A N/A Date Acquired: 1 N/A N/A Weeks of Treatment: Open N/A N/A Wound Status: No N/A N/A Wound Recurrence: 2x1x0.1 N/A N/A Measurements L x W x D (cm) 1.571 N/A N/A A (cm) : rea 0.157 N/A N/A Volume (cm) : 4.70% N/A N/A % Reduction in  Area: 4.80% N/A N/A % Reduction in Volume: Full Thickness Without Exposed N/A N/A Classification: Support Structures Medium N/A N/A Exudate Amount: Serosanguineous N/A N/A Exudate Type: red, brown N/A N/A Exudate Color: Flat and Intact N/A N/A Wound Margin: Large (67-100%) N/A N/A Granulation Amount: Pink N/A N/A Granulation Quality: Beshara, Peterson G ([id number]) [id number] Page 3 of 7 Small (1-33%) N/A N/A Necrotic Amount: Fat Layer (Subcutaneous Tissue): Yes N/A N/A Exposed Structures: Fascia: No Tendon: No Muscle: No Joint: No Bone: No Small (1-33%) N/A N/A Epithelialization: Debridement - Selective/Open Wound N/A N/A Debridement: Pre-procedure Verification/Time Out 15:15 N/A N/A Taken: Lidocaine 4% Topical Solution N/A N/A Pain Control: Slough N/A N/A Tissue Debrided: Non-Viable Tissue N/A N/A Level: 2 N/A N/A Debridement A (sq cm): rea Curette N/A N/A Instrument: Minimum N/A N/A Bleeding: Pressure N/A N/A Hemostasis A chieved: 0 N/A N/A Procedural Pain: 0 N/A N/A Post Procedural Pain: Procedure was tolerated well N/A N/A Debridement Treatment Response: 2x1x0.1 N/A N/A Post Debridement Measurements L x W x D (cm) 0.157 N/A N/A Post Debridement Volume:  (cm) No Abnormalities Noted N/A N/A Periwound Skin Texture: No Abnormalities Noted N/A N/A Periwound Skin Moisture: Hemosiderin Staining: Yes N/A N/A Periwound Skin Color: No Abnormality N/A N/A Temperature: Compression Therapy N/A N/A Procedures Performed: Debridement Treatment Notes Electronic Signature(s) Signed: 04/17/2022 7:38:39 AM By: [name] MD FACS Entered By: [name] on 04/17/2022 07:38:39 -------------------------------------------------------------------------------- Multi-Disciplinary Care Plan Details Patient Name: Date of Service: [name], [name] 04/16/2022 2:45 PM Medical Record Number: [id number] Patient Account Number: [id number] Date of Birth/Sex: Treating RN: 12/01/1935 (87 y.o. [name] Primary Care Hyman Crossan: Haynes Hoehn Other Clinician: Referring Socorro Kanitz: Treating Escarlet Saathoff/Extender: Mellody Drown in Treatment: 3 Multidisciplinary Care Plan reviewed with physician Active Inactive Abuse / Safety / Falls / Self Care Management Nursing Diagnoses: Impaired physical mobility Potential for falls Goals: Patient/caregiver will verbalize understanding of skin care regimen Date Initiated: 03/24/2022 Target Resolution Date: 05/15/2022 Goal Status: Active Patient/caregiver will verbalize/demonstrate measures taken to improve the patient's personal safety Date Initiated: 03/24/2022 Target Resolution Date: 05/15/2022 Goal Status: Active Interventions: Assess fall risk on admission and as needed Notes: Venous Leg Ulcer Nursing Diagnoses: Knowledge deficit related to disease process and management Soldo, Fedor G ([id number]) [id number] Page 4 of 7 Potential for venous Insuffiency (use before diagnosis confirmed) Goals: Patient will maintain optimal edema control Date Initiated: 04/08/2022 Target Resolution Date: 05/06/2022 Goal Status: Active Interventions: Assess peripheral edema status  every visit. Compression as ordered Treatment Activities: Therapeutic compression applied : 04/08/2022 Notes: Wound/Skin Impairment Nursing Diagnoses: Impaired tissue integrity Knowledge deficit related to ulceration/compromised skin integrity Goals: Patient/caregiver will verbalize understanding of skin care regimen Date Initiated: 04/08/2022 Target Resolution Date: 05/06/2022 Goal Status: Active Ulcer/skin breakdown will have a volume reduction of 30% by week 4 Date Initiated: 04/08/2022 Target Resolution Date: 05/06/2022 Goal Status: Active Interventions: Assess patient/caregiver ability to obtain necessary supplies Assess patient/caregiver ability to perform ulcer/skin care regimen upon admission and as needed Assess ulceration(s) every visit Provide education on ulcer and skin care Treatment Activities: Skin care regimen initiated : 04/08/2022 Topical wound management initiated : 04/08/2022 Notes: Electronic Signature(s) Signed: 04/16/2022 5:28:22 PM By: Baruch [name] RN, BSN Entered By: Baruch [name] on 04/16/2022 15:12:01 -------------------------------------------------------------------------------- Pain Assessment Details Patient Name: Date of Service: [name], [name] 04/16/2022 2:45 PM Medical Record Number: [id number] Patient Account Number: [id number] Date of Birth/Sex: Treating RN: 1935-08-25 (87 y.o. [name] Primary Care Florabelle Cardin: Haynes Hoehn Other Clinician: Referring Raylin Winer:  Treating Lexus Barletta/Extender: Mellody Drown in Treatment: 3 Active Problems Location of Pain Severity and Description of Pain Patient Has Paino No Site Locations Rate the pain. ALANO, KYRIACOU ([id number]) [id number] Page 5 of 7 Rate the pain. Current Pain Level: 0 Pain Management and Medication Current Pain Management: Electronic Signature(s) Signed: 04/16/2022 5:28:22 PM By: Baruch [name] RN, BSN Entered By: Baruch [name]  on 04/16/2022 15:01:17 -------------------------------------------------------------------------------- Patient/Caregiver Education Details Patient Name: Date of Service: [name], [name] 3/7/2024andnbsp2:45 PM Medical Record Number: [id number] Patient Account Number: [id number] Date of Birth/Gender: Treating RN: [birth year] (87 y.o. [name] Primary Care Physician: Haynes Hoehn Other Clinician: Referring Physician: Treating Physician/Extender: Mellody Drown in Treatment: 3 Education Assessment Education Provided To: Patient Education Topics Provided Venous: Methods: Explain/Verbal Responses: Reinforcements needed, State content correctly Wound/Skin Impairment: Methods: Explain/Verbal Responses: Reinforcements needed, State content correctly Electronic Signature(s) Signed: 04/16/2022 5:28:22 PM By: Baruch [name] RN, BSN Entered By: Baruch [name] on 04/16/2022 15:12:21 -------------------------------------------------------------------------------- Wound Assessment Details Patient Name: Date of Service: [name], [name] 04/16/2022 2:45 PM Medical Record Number: [id number] Patient Account Number: [id number] Date of Birth/Sex: Treating RN: Jul 19, 1935 (87 y.o. [name] Primary Care Robertson Colclough: Haynes Hoehn Other Clinician: Referring Jaxsyn Azam: Treating Laden Fieldhouse/Extender: Mellody Drown in Treatment: 3 Wound Status Wound Number: 11 Primary Venous Leg Ulcer Etiology: Wound Location: Right, Lateral Lower Leg Gearhart, Rohin G ([id number]) [id number] Page 6 of 7 Wound Open Wounding Event: Gradually Appeared Status: Date Acquired: 04/03/2022 Comorbid Cataracts, Chronic Obstructive Pulmonary Disease (COPD), Weeks Of Treatment: 1 History: Coronary Artery Disease, Deep Vein Thrombosis, Hypertension, Clustered Wound: No Peripheral Venous Disease, Osteoarthritis, Received Chemotherapy, Received  Radiation Photos Wound Measurements Length: (cm) 2 Width: (cm) 1 Depth: (cm) 0.1 Area: (cm) 1.571 Volume: (cm) 0.157 % Reduction in Area: 4.7% % Reduction in Volume: 4.8% Epithelialization: Small (1-33%) Tunneling: No Undermining: No Wound Description Classification: Full Thickness Without Exposed Support Structures Wound Margin: Flat and Intact Exudate Amount: Medium Exudate Type: Serosanguineous Exudate Color: red, brown Foul Odor After Cleansing: No Slough/Fibrino Yes Wound Bed Granulation Amount: Large (67-100%) Exposed Structure Granulation Quality: Pink Fascia Exposed: No Necrotic Amount: Small (1-33%) Fat Layer (Subcutaneous Tissue) Exposed: Yes Necrotic Quality: Adherent Slough Tendon Exposed: No Muscle Exposed: No Joint Exposed: No Bone Exposed: No Periwound Skin Texture Texture Color No Abnormalities Noted: Yes No Abnormalities Noted: No Hemosiderin Staining: Yes Moisture No Abnormalities Noted: Yes Temperature / Pain Temperature: No Abnormality Electronic Signature(s) Signed: 04/16/2022 5:28:22 PM By: Baruch [name] RN, BSN Entered By: Baruch [name] on 04/16/2022 15:11:32 -------------------------------------------------------------------------------- Vitals Details Patient Name: Date of Service: [name], [name] 04/16/2022 2:45 PM Medical Record Number: [id number] Patient Account Number: [id number] Date of Birth/Sex: Treating RN: January 11, 1936 (87 y.o. [name] Primary Care Dinero Chavira: Haynes Hoehn Other Clinician: Referring Joceline Hinchcliff: Treating Jermall Isaacson/Extender: Mellody Drown in Treatment: 3 Vital Signs Time Taken: 15:00 Temperature (F): 97.9 Height (in): 75 Pulse (bpm): 74 Weight (lbs): 257 Respiratory Rate (breaths/min): 20 Body Mass Index (BMI): 32.1 Blood Pressure (mmHg): 165/74 Kackley, Tyress G ([id number]) [id number] Page 7 of 7 Reference Range: 80 - 120 mg / dl Electronic  Signature(s) Signed: 04/16/2022 5:28:22 PM By: Baruch [name] RN, BSN Entered By: Baruch [name] on 04/16/2022 15:01:06

## 2022-04-21 NOTE — Progress Notes (Signed)
Cardiology Office Note   Date:  04/22/2022   ID:  RUDELL [name], DOB 10-03-1935, MRN [id number]  PCP:  Verlee Monte, NP  Cardiologist:   None   Chief Complaint  Patient presents with   Coronary Artery Disease      History of Present Illness: [name] is a 87 y.o. male who presents for follow up of CAD.  He was in the hospital in November.  He had sudden leg weakness.  He had an extensive work-up but there was no clear neurologic cause.  He did have nonhealing leg ulcers.  He was thought to have a COPD flare.  He ended up going to rehab afterwards.  He has been seen in the wound clinic.  He actually had angiography of his lower extremities and demonstrated no flow-limiting stenosis.  An echocardiogram demonstrated his EF to be 50 to 55% with no significant abnormalities including no significant pulmonary hypertension.    Since he was last seen he has had no new cardiovascular symptoms.  He gets around in a wheelchair for his back pain.  He sleeps in a lift chair.  He can use canes or a walker a little bit in the house.   The patient denies any new symptoms such as chest discomfort, neck or arm discomfort. There has been no new shortness of breath, PND or orthopnea. There have been no reported palpitations, presyncope or syncope.  He uses 2 L of oxygen at night.  He is seen in the wound center for some nonhealing leg wounds but he is very happy with the fact that he has less lower extremity edema than he had previously.   Past Medical History:  Diagnosis Date   Arthritis    knees, hips   CAD (coronary artery disease)    Abnormal stress perfusion study with apical and inferior wall ischemia. LAD had a long 99% stenosis followed by 30% stenosis, first diagonal 25% stenosis, circumflex luminal irregularities, second obtuse marginal 25% stenosis, RCA 40% followed by 40% stenosis. EF 60%. Stenting with 2 bare metal stents by Dr. Excell Seltzer to the LAD.   Colon cancer [facility])    Radiation,  chemo, surgery 2011   Complication of anesthesia    Pt states he coughs alot during anesthesia   COPD (chronic obstructive pulmonary disease) (HCC)    Dyspnea    when walking, knee pain    Dysrhythmia    History of kidney stones    passed x1   History of tobacco use    HOH (hard of hearing)    Hypertension    Pneumonia    many times as a child    Past Surgical History:  Procedure Laterality Date   ABDOMINAL AORTOGRAM W/LOWER EXTREMITY N/A 01/09/2019   Procedure: ABDOMINAL AORTOGRAM W/LOWER EXTREMITY;  Surgeon: Maeola Harman, MD;  Location: [facility] INVASIVE CV LAB;  Service: Cardiovascular;  Laterality: N/A;   BRONCHIAL BIOPSY  08/15/2019   Procedure: BRONCHIAL BIOPSIES;  Surgeon: Josephine Igo, DO;  Location: [facility] ENDOSCOPY;  Service: Pulmonary;;   BRONCHIAL BRUSHINGS  08/15/2019   Procedure: BRONCHIAL BRUSHINGS;  Surgeon: Josephine Igo, DO;  Location: [facility] ENDOSCOPY;  Service: Pulmonary;;   BRONCHIAL NEEDLE ASPIRATION BIOPSY  08/15/2019   Procedure: BRONCHIAL NEEDLE ASPIRATION BIOPSIES;  Surgeon: Josephine Igo, DO;  Location: [facility] ENDOSCOPY;  Service: Pulmonary;;   BRONCHIAL WASHINGS  08/15/2019   Procedure: BRONCHIAL WASHINGS;  Surgeon: Josephine Igo, DO;  Location: [facility] ENDOSCOPY;  Service: Pulmonary;;  CATARACT EXTRACTION W/PHACO Left 05/29/2016   Procedure: CATARACT EXTRACTION PHACO AND INTRAOCULAR LENS PLACEMENT (IOC);  Surgeon: Fabio Pierce, MD;  Location: [facility] ORS;  Service: Ophthalmology;  Laterality: Left;  CDE: 10.39   CATARACT EXTRACTION W/PHACO Right 06/12/2016   Procedure: CATARACT EXTRACTION PHACO AND INTRAOCULAR LENS PLACEMENT RIGHT EYE CDE= 13.59;  Surgeon: Fabio Pierce, MD;  Location: [facility] ORS;  Service: Ophthalmology;  Laterality: Right;  right   CORONARY ANGIOPLASTY WITH STENT PLACEMENT  2008   x 2   HEMICOLECTOMY  01/10/2010   INGUINAL HERNIA REPAIR  2012   PORTACATH PLACEMENT  2011   portacath removal  2011   UMBILICAL HERNIA REPAIR  01/2008   left   VIDEO  BRONCHOSCOPY WITH ENDOBRONCHIAL NAVIGATION N/A 08/15/2019   Procedure: VIDEO BRONCHOSCOPY WITH ENDOBRONCHIAL NAVIGATION;  Surgeon: Josephine Igo, DO;  Location: [facility] ENDOSCOPY;  Service: Pulmonary;  Laterality: N/A;     Current Outpatient Medications  Medication Sig Dispense Refill   acetaminophen (TYLENOL) 500 MG tablet Take 1,000 mg by mouth every 6 (six) hours as needed.     albuterol (VENTOLIN HFA) 108 (90 Base) MCG/ACT inhaler Inhale 2 puffs into the lungs every 6 (six) hours as needed for wheezing or shortness of breath. 6.7 g 2   amLODipine-benazepril (LOTREL) 10-40 MG capsule Take 1 capsule by mouth daily.     aspirin 81 MG chewable tablet Chew 81 mg by mouth every evening.     atorvastatin (LIPITOR) 20 MG tablet Take 1 tablet (20 mg total) by mouth every evening.     Cholecalciferol (VITAMIN D3) 50 MCG (2000 UT) TABS Take 2,000 Units by mouth 2 (two) times daily.      Cinnamon 500 MG TABS Take 500 mg by mouth 2 (two) times daily.     Cyanocobalamin (VITAMIN B12 PO) Use as directed 1 tablet in the mouth or throat daily.     Emollient (CERAVE) CREA Apply 1 application topically 2 (two) times daily.     ferrous sulfate 325 (65 FE) MG tablet Take 325 mg by mouth daily with breakfast.     Fluticasone-Umeclidin-Vilant (TRELEGY ELLIPTA) 200-62.5-25 MCG/ACT AEPB Inhale 1 puff into the lungs daily. 180 each 6   hydrochlorothiazide (HYDRODIURIL) 25 MG tablet Take 25 mg by mouth daily.     Magnesium 250 MG TABS Take 250 mg by mouth daily.      metoprolol tartrate (LOPRESSOR) 100 MG tablet Take 1 tablet (100 mg total) by mouth at bedtime.     Multiple Vitamins-Minerals (MENS MULTIPLE VITAMIN/LYCOPENE PO) Take 1 tablet by mouth daily.     nitroGLYCERIN (NITROSTAT) 0.4 MG SL tablet Place 1 tablet (0.4 mg total) under the tongue every 5 (five) minutes as needed. 25 tablet 3   OVER THE COUNTER MEDICATION Take 2 tablets by mouth daily. Fish, flax and borage oil     Turmeric (QC TUMERIC COMPLEX PO)  Take by mouth 2 (two) times daily.     zinc gluconate 50 MG tablet Take 50 mg by mouth daily.     No current facility-administered medications for this visit.    Allergies:   Other and Sulfa antibiotics    ROS:  Please see the history of present illness.   Otherwise, review of systems are positive for NONE.   All other systems are reviewed and negative.    PHYSICAL EXAM: VS:  BP 128/70   Pulse 65   Ht 6\' 2"  (1.88 m)   Wt 252 lb (114.3 kg)   BMI 32.35  kg/m  , BMI Body mass index is 32.35 kg/m. GEN:  No distress NECK:  No jugular venous distention at 90 degrees, waveform within normal limits, carotid upstroke brisk and symmetric, no bruits, no thyromegaly LYMPHATICS:  No cervical adenopathy LUNGS:  Clear to auscultation bilaterally BACK:  No CVA tenderness CHEST:  Unremarkable HEART:  S1 and S2 within normal limits, no S3, no S4, no clicks, no rubs, NO murmurs ABD:  Positive bowel sounds normal in frequency in pitch, no bruits, no rebound, no guarding, unable to assess midline mass or bruit with the patient seated. EXT:  2 plus pulses throughout, moderate edema, no cyanosis no clubbing SKIN:  No rashes no nodules NEURO:  Cranial nerves II through XII grossly intact, motor grossly intact throughout PSYCH:  Cognitively intact, oriented to person place and time    EKG:  EKG is ordered today. The ekg ordered today demonstrates sinus bradycardia, rate 65, right bundle branch block, left anterior fascicular block   Recent Labs: No results found for requested labs within last 365 days.    Lipid Panel    Component Value Date/Time   CHOL 113 06/09/2012 0946   TRIG 104 06/09/2012 0946   HDL 44 06/09/2012 0946   CHOLHDL 5.3 11/24/2007 0438   VLDL 15 11/24/2007 0438   LDLCALC 48 06/09/2012 0946      Wt Readings from Last 3 Encounters:  04/22/22 252 lb (114.3 kg)  03/06/22 261 lb (118.4 kg)  08/29/21 274 lb 6.4 oz (124.5 kg)      Other studies Reviewed: Additional  studies/ records that were reviewed today include: Labs Review of the above records demonstrates:  Please see elsewhere in the note.     ASSESSMENT AND PLAN:  CAD (coronary artery disease) - The patient has no new sypmtoms.  No further cardiovascular testing is indicated.  We will continue with aggressive risk reduction and meds as listed.  DYSLIPIDEMIA -  LDL previously was 41.  He is to get me a copy of the most recent.   HYPERTENSION, UNSPECIFIED -  The blood pressure is at target.  No change in therapy.    ABNORMAL EKG - He has had no symptomatic bradycardia syncope or presyncope.   EDEMA -   This is much improved.  No change in therapy.  Current medicines are reviewed at length with the patient today.  The patient does not have concerns regarding medicines.  The following changes have been made: None  Labs/ tests ordered today include:   None  Orders Placed This Encounter  Procedures   EKG 12-Lead     Disposition:   FU with me in one year. Forest Becker, MD  04/22/2022 4:34 PM    [facility]

## 2022-04-22 ENCOUNTER — Encounter (HOSPITAL_BASED_OUTPATIENT_CLINIC_OR_DEPARTMENT_OTHER): Payer: Medicare Other | Admitting: Internal Medicine

## 2022-04-22 ENCOUNTER — Encounter: Payer: Self-pay | Admitting: Cardiology

## 2022-04-22 ENCOUNTER — Ambulatory Visit (INDEPENDENT_AMBULATORY_CARE_PROVIDER_SITE_OTHER): Payer: Medicare Other | Admitting: Cardiology

## 2022-04-22 VITALS — BP 128/70 | HR 65 | Ht 74.0 in | Wt 252.0 lb

## 2022-04-22 DIAGNOSIS — E785 Hyperlipidemia, unspecified: Secondary | ICD-10-CM | POA: Diagnosis not present

## 2022-04-22 DIAGNOSIS — I714 Abdominal aortic aneurysm, without rupture, unspecified: Secondary | ICD-10-CM | POA: Diagnosis not present

## 2022-04-22 DIAGNOSIS — I2510 Atherosclerotic heart disease of native coronary artery without angina pectoris: Secondary | ICD-10-CM | POA: Diagnosis not present

## 2022-04-22 DIAGNOSIS — L97812 Non-pressure chronic ulcer of other part of right lower leg with fat layer exposed: Secondary | ICD-10-CM

## 2022-04-22 NOTE — Progress Notes (Addendum)
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 Venous Disease Negative for: Angina; Arrhythmia; Congestive Heart Failure; Hypotension; Myocardial Infarction; Peripheral Arterial Disease; Phlebitis; Vasculitis Gastrointestinal Medical History: Negative for: Cirrhosis ; Colitis; Crohns; Hepatitis A; Hepatitis B; Hepatitis C Endocrine Medical History: Negative for: Type I Diabetes; Type II Diabetes Genitourinary Medical History: Negative for: End Stage Renal Disease Past Medical History Notes: enlarged prostate Immunological Medical History: Negative for: Lupus Erythematosus; Raynauds; Scleroderma Integumentary (Skin) Medical History: Negative for: History of Burn Musculoskeletal Medical History: Positive for: Osteoarthritis Negative for: Gout; Rheumatoid Arthritis; Osteomyelitis Neurologic Medical History: Negative for: Dementia; Neuropathy; Quadriplegia; Paraplegia; Seizure Disorder Bonadonna, Albie G ([id number]) [id number] Page 11 of 12 Oncologic Medical History: Positive for: Received Chemotherapy - 2015; Received Radiation - 2015 Past Medical History Notes: hx colon CA Psychiatric Medical History: Negative for: Anorexia/bulimia; Confinement Anxiety HBO Extended History Items Eyes: Cataracts Immunizations Pneumococcal Vaccine: Received Pneumococcal Vaccination: Yes Received Pneumococcal Vaccination  On or After 60th Birthday: Yes Implantable Devices None Hospitalization / Surgery History Type of Hospitalization/Surgery colon resection umbilical hernia repair Family and Social History Cancer: No; Diabetes: No; Heart Disease: No; Hereditary Spherocytosis: No; Hypertension: No; Kidney Disease: No; Lung Disease: No; Seizures: No; Stroke: No; Thyroid Problems: No; Tuberculosis: No; Former smoker; Marital Status - Married; Alcohol Use: Rarely; Drug Use: No History; Caffeine Use: Daily; Financial  Concerns: No; Food, Clothing or Shelter Needs: No; Support System Lacking: No; Transportation Concerns: No Electronic Signature(s) Signed: 04/22/2022 11:58:37 AM By: Geralyn Corwin DO Entered By: Geralyn Corwin on 04/22/2022 09:58:42 -------------------------------------------------------------------------------- SuperBill Details Patient Name: Date of Service: [name], [facility] 04/22/2022 Medical Record Number: [id number] Patient Account Number: [id number] Date of Birth/Sex: Treating RN: Nov 13, 1935 (87 y.o. M) Primary Care Provider: Belva Agee Other Clinician: Referring Provider: Treating Provider/Extender: Ellis Savage in Treatment: 4 Diagnosis Coding ICD-10 Codes Code Description [phone] Non-pressure chronic ulcer of other part of right lower leg with fat layer exposed B35.3 Tinea pedis J44.9 Chronic obstructive pulmonary disease, unspecified D69.2 Other nonthrombocytopenic purpura I10 Essential (primary) hypertension I87.8 Other specified disorders of veins Facility Procedures : CPT4 Code: [id number] Description: 97597 - DEBRIDE WOUND 1ST 20 SQ CM OR < ICD-10 Diagnosis Description L97.812 Non-pressure chronic ulcer of other part of right lower leg with fat layer expos Modifier: ed Quantity: 1 Physician Procedures : CPT4 Code Description Modifier Leidner, Rico G ([id number]) [id number] - WC PHYS DEBR WO ANESTH 20 SQ CM 1 ICD-10 Diagnosis Description L97.812 Non-pressure chronic ulcer of other part of right lower leg with fat  layer exposed Quantity: 7.pdf Page 12 of 12 Electronic Signature(s) Signed: 04/22/2022 11:58:37 AM By: Geralyn Corwin DO Entered By: Geralyn Corwin on 04/22/2022 10:02:27

## 2022-04-22 NOTE — Patient Instructions (Signed)
Medication Instructions:  The current medical regimen is effective;  continue present plan and medications.  *If you need a refill on your cardiac medications before your next appointment, please call your pharmacy*  Follow-Up: At [facility], you and your health needs are our priority.  As part of our continuing mission to provide you with exceptional heart care, we have created designated Provider Care Teams.  These Care Teams include your primary Cardiologist (physician) and Advanced Practice Providers (APPs -  Physician Assistants and Nurse Practitioners) who all work together to provide you with the care you need, when you need it.  We recommend signing up for the patient portal called "MyChart".  Sign up information is provided on this After Visit Summary.  MyChart is used to connect with patients for Virtual Visits (Telemedicine).  Patients are able to view lab/test results, encounter notes, upcoming appointments, etc.  Non-urgent messages can be sent to your provider as well.   To learn more about what you can do with MyChart, go to [email or web address].    Your next appointment:   1 year(s)  Provider:   James Hochrein, MD     

## 2022-04-29 ENCOUNTER — Encounter (HOSPITAL_BASED_OUTPATIENT_CLINIC_OR_DEPARTMENT_OTHER): Payer: Medicare Other | Admitting: General Surgery

## 2022-04-29 DIAGNOSIS — L97812 Non-pressure chronic ulcer of other part of right lower leg with fat layer exposed: Secondary | ICD-10-CM | POA: Diagnosis not present

## 2022-04-30 NOTE — Progress Notes (Signed)
[name], [name] ([id number]) [id number] Page 1 of 3 Visit Report for 04/29/2022 Arrival Information Details Patient Name: Date of Service: [name], [name]. 04/29/2022 11:15 A M Medical Record Number: [id number] Patient Account Number: [id number] Date of Birth/Sex: Treating RN: [birth year] (87 y.o. [name] Primary Care [name]: [name] Other Clinician: Referring Prachi Oftedahl: Treating Celena Lanius/Extender: Mellody Drown in Treatment: 5 Visit Information History Since Last Visit Added or deleted any medications: No Patient Arrived: Wheel Chair Any new allergies or adverse reactions: No Arrival Time: 11:20 Had a fall or experienced change in No Accompanied By: spouse activities of daily living that may affect Transfer Assistance: None risk of falls: Patient Identification Verified: Yes Signs or symptoms of abuse/neglect since last visito No Secondary Verification Process Completed: Yes Hospitalized since last visit: No Patient Requires Transmission-Based Precautions: No Implantable device outside of the clinic excluding No Patient Has Alerts: No cellular tissue based products placed in the center since last visit: Has Dressing in Place as Prescribed: Yes Has Compression in Place as Prescribed: Yes Pain Present Now: No Electronic Signature(s) Signed: 04/29/2022 5:57:17 PM By: Baruch [name] RN, BSN Entered By: Baruch [name] on 04/29/2022 11:24:20 -------------------------------------------------------------------------------- Compression Therapy Details Patient Name: Date of Service: [name], [name]. 04/29/2022 11:15 A M Medical Record Number: [id number] Patient Account Number: [id number] Date of Birth/Sex: Treating RN: 12-20-1935 (87 y.o. [name] Primary Care Keylon Labelle: [name] Other Clinician: Referring Anita Mcadory: Treating Lindwood Mogel/Extender: Mellody Drown in Treatment: 5 Compression  Therapy Performed for Wound Assessment: Wound #11 Right,Lateral Lower Leg Performed By: Clinician Baruch [name], RN Compression Type: Double Layer Electronic Signature(s) Signed: 04/29/2022 5:57:17 PM By: Baruch [name] RN, BSN Entered By: Baruch [name] on 04/29/2022 11:24:51 -------------------------------------------------------------------------------- Encounter Discharge Information Details Patient Name: Date of Service: [name], [name]. 04/29/2022 11:15 A M Medical Record Number: [id number] Patient Account Number: [id number] Date of Birth/Sex: Treating RN: 04-07-1935 (87 y.o. [name] Primary Care Taydon Nasworthy: [name] Other Clinician: Referring Darnelle Derrick: Treating Lorma Heater/Extender: Mellody Drown in Treatment: 5 Encounter Discharge Information Items Discharge Condition: Stable Ambulatory Status: Wheelchair Discharge Destination: Home Transportation: Private Auto Accompanied By: spouse Schedule Follow-up Appointment: Yes Clinical Summary of Care: Patient [name], [name] ([id number]) [id number] Page 2 of 3 Electronic Signature(s) Signed: 04/29/2022 5:57:17 PM By: Baruch [name] RN, BSN Entered By: Baruch [name] on 04/29/2022 11:43:44 -------------------------------------------------------------------------------- Patient/Caregiver Education Details Patient Name: Date of Service: [name], [name]. 3/20/2024andnbsp11:15 A M Medical Record Number: [id number] Patient Account Number: [id number] Date of Birth/Gender: Treating RN: 11-15-1935 (87 y.o. [name] Primary Care Physician: [name] Other Clinician: Referring Physician: Treating Physician/Extender: Mellody Drown in Treatment: 5 Education Assessment Education Provided To: Patient Education Topics Provided Venous: Methods: Explain/Verbal Responses: Reinforcements needed, State content correctly Electronic  Signature(s) Signed: 04/29/2022 5:57:17 PM By: Baruch [name] RN, BSN Entered By: Baruch [name] on 04/29/2022 11:43:25 -------------------------------------------------------------------------------- Wound Assessment Details Patient Name: Date of Service: [name], [name]. 04/29/2022 11:15 A M Medical Record Number: [id number] Patient Account Number: [id number] Date of Birth/Sex: Treating RN: 09-20-1935 (87 y.o. [name] Primary Care Diani Jillson: [name] Other Clinician: Referring Yamila Cragin: Treating Chalsey Leeth/Extender: Mellody Drown in Treatment: 5 Wound Status Wound Number: 11 Primary Etiology: Venous Leg Ulcer Wound Location: Right, Lateral Lower Leg Wound Status: Open Wounding Event: Gradually Appeared Date Acquired: 04/03/2022 Weeks Of Treatment: 3 Clustered Wound: No Wound Measurements Length: (cm) 2.3 Width: (cm) 1.1 Depth: (cm)  0.1 Area: (cm) 1.987 Volume: (cm) 0.199 % Reduction in Area: -20.5% % Reduction in Volume: -20.6% Wound Description Classification: Full Thickness Without Exposed Support Exudate Amount: Medium Exudate Type: Serosanguineous Exudate Color: red, brown Structures Periwound Skin Texture Texture Color No Abnormalities Noted: No No Abnormalities Noted: No Moisture No Abnormalities Noted: No [name], [name] ([id number] Page 3 of 3 Treatment Notes Wound #11 (Lower Leg) Wound Laterality: Right, Lateral Cleanser Peri-Wound Care Sween Lotion (Moisturizing lotion) Discharge Instruction: Apply moisturizing lotion as directed Topical Primary Dressing Promogran Prisma Matrix, 4.34 (sq in) (silver collagen) Discharge Instruction: Moisten collagen with saline or hydrogel Secondary Dressing Woven Gauze Sponge, Non-Sterile 4x4 in Discharge Instruction: Apply over primary dressing as directed. Secured With Compression Wrap ThreePress (3 layer compression wrap) Discharge  Instruction: Apply three layer compression as directed. Compression Stockings Add-Ons Electronic Signature(s) Signed: 04/29/2022 5:57:17 PM By: Baruch [name] RN, BSN Entered By: Baruch [name] on 04/29/2022 11:24:36

## 2022-04-30 NOTE — Progress Notes (Signed)
Colm, Herpel Pinewood ([id number]) [id number] Page 1 of 1 Visit Report for 04/29/2022 SuperBill Details Patient Name: Date of Service: [name], [facility] Darnell Level 04/29/2022 Medical Record Number: [id number] Patient Account Number: [id number] Date of Birth/Sex: Treating RN: 16-Oct-1935 (87 y.o. [name] Primary Care Provider: Haynes Hoehn Other Clinician: Referring Provider: Treating Provider/Extender: Mellody Drown in Treatment: 5 Diagnosis Coding ICD-10 Codes Code Description [phone] Non-pressure chronic ulcer of other part of right lower leg with fat layer exposed B35.3 Tinea pedis J44.9 Chronic obstructive pulmonary disease, unspecified D69.2 Other nonthrombocytopenic purpura I10 Essential (primary) hypertension I87.8 Other specified disorders of veins Facility Procedures CPT4 Code Description Modifier Quantity [id number] (Facility Use Only) [phone] - APPLY MULTLAY COMPRS LWR RT LEG 1 Electronic Signature(s) Signed: 04/29/2022 3:48:41 PM By: [name] MD FACS Signed: 04/29/2022 5:57:17 PM By: Baruch [name] RN, BSN Entered By: Baruch [name] on 04/29/2022 11:43:57

## 2022-05-06 ENCOUNTER — Encounter (HOSPITAL_BASED_OUTPATIENT_CLINIC_OR_DEPARTMENT_OTHER): Payer: Medicare Other | Admitting: General Surgery

## 2022-05-06 DIAGNOSIS — L97812 Non-pressure chronic ulcer of other part of right lower leg with fat layer exposed: Secondary | ICD-10-CM | POA: Diagnosis not present

## 2022-05-07 NOTE — Progress Notes (Signed)
[name], [name] ([id number]) [id number] Page 1 of 7 Visit Report for 05/06/2022 Arrival Information Details Patient Name: Date of Service: [name], [name] 05/06/2022 11:00 A M Medical Record Number: [id number] Patient Account Number: [id number] Date of Birth/Sex: Treating RN: 1935/03/25 (87 y.o. M) Primary Care Jigar Zielke: Haynes Hoehn Other Clinician: Referring Saphyra Hutt: Treating Alaysha Jefcoat/Extender: Mellody Drown in Treatment: 6 Visit Information History Since Last Visit All ordered tests and consults were completed: No Patient Arrived: Wheel Chair Added or deleted any medications: No Arrival Time: 11:32 Any new allergies or adverse reactions: No Accompanied By: spouse Had a fall or experienced change in No Transfer Assistance: None activities of daily living that may affect Patient Identification Verified: Yes risk of falls: Secondary Verification Process Completed: Yes Signs or symptoms of abuse/neglect since last visito No Patient Requires Transmission-Based Precautions: No Hospitalized since last visit: No Patient Has Alerts: No Implantable device outside of the clinic excluding No cellular tissue based products placed in the center since last visit: Pain Present Now: No Electronic Signature(s) Signed: 05/06/2022 12:08:21 PM By: Worthy Rancher Entered By: Worthy Rancher on 05/06/2022 11:33:38 -------------------------------------------------------------------------------- Compression Therapy Details Patient Name: Date of Service: [name], [name] 05/06/2022 11:00 A M Medical Record Number: [id number] Patient Account Number: [id number] Date of Birth/Sex: Treating RN: 16-Dec-1935 (87 y.o. Collene Gobble Primary Care Ahnesti Townsend: Haynes Hoehn Other Clinician: Referring Jase Himmelberger: Treating Jozalynn Noyce/Extender: Mellody Drown in Treatment: 6 Compression Therapy Performed for Wound Assessment: Wound #11 Right,Lateral Lower  Leg Performed By: Clinician Dellie Catholic, RN Compression Type: Three Layer Post Procedure Diagnosis Same as Pre-procedure Electronic Signature(s) Signed: 05/06/2022 6:13:20 PM By: Dellie Catholic RN Entered By: Dellie Catholic on 05/06/2022 12:05:45 -------------------------------------------------------------------------------- Encounter Discharge Information Details Patient Name: Date of Service: [name], [name] 05/06/2022 11:00 A M Medical Record Number: [id number] Patient Account Number: [id number] Date of Birth/Sex: Treating RN: [birth year] (87 y.o. Collene Gobble Primary Care Sharone Almond: Haynes Hoehn Other Clinician: Referring Lailee Hoelzel: Treating Manvir Prabhu/Extender: Mellody Drown in Treatment: 6 Encounter Discharge Information Items Post Procedure Vitals Discharge Condition: Stable Temperature (F): 97.8 Ambulatory Status: Wheelchair Pulse (bpm): 61 Discharge Destination: Home Respiratory Rate (breaths/min): 20 Transportation: Private Auto Blood Pressure (mmHg): 163/83 Accompanied By: spouse Stamant, Briar G ([id number] Page 2 of 7 Schedule Follow-up Appointment: Yes Clinical Summary of Care: Patient Declined Electronic Signature(s) Signed: 05/06/2022 6:13:20 PM By: Dellie Catholic RN Entered By: Dellie Catholic on 05/06/2022 17:56:59 -------------------------------------------------------------------------------- Lower Extremity Assessment Details Patient Name: Date of Service: [name], [name] 05/06/2022 11:00 A M Medical Record Number: [id number] Patient Account Number: [id number] Date of Birth/Sex: Treating RN: [birth year] (87 y.o. Collene Gobble Primary Care Hailey Stormer: Haynes Hoehn Other Clinician: Referring Naftuli Dalsanto: Treating Ayra Hodgdon/Extender: Mellody Drown in Treatment: 6 Edema Assessment Assessed: [name]: No] [Right: No] Edema: [Left: Ye] [Right: s] Calf Left: Right: Point of  Measurement: From Medial Instep 36.5 cm Ankle Left: Right: Point of Measurement: From Medial Instep 24.2 cm Vascular Assessment Pulses: Dorsalis Pedis Palpable: [Right:Yes] Electronic Signature(s) Signed: 05/06/2022 6:13:20 PM By: Dellie Catholic RN Entered By: Dellie Catholic on 05/06/2022 17:53:36 -------------------------------------------------------------------------------- Multi Wound Chart Details Patient Name: Date of Service: [name], [name] 05/06/2022 11:00 A M Medical Record Number: [id number] Patient Account Number: [id number] Date of Birth/Sex: Treating RN: [birth year] (87 y.o. M) Primary Care Zelia Yzaguirre: Haynes Hoehn Other Clinician: Referring Brunilda Eble: Treating Scorpio Fortin/Extender: Mellody Drown in Treatment: 6 Vital Signs Height(in): 75 Pulse(bpm): 61 Weight(lbs): 257 Blood  Pressure(mmHg): 163/83 Body Mass Index(BMI): 32.1 Temperature(F): 97.8 Respiratory Rate(breaths/min): 20 [11:Photos:] [N/A:N/A] Right, Lateral Lower Leg N/A N/A Wound Location: Gradually Appeared N/A N/A Wounding Event: Venous Leg Ulcer N/A N/A Primary Etiology: Cataracts, Chronic Obstructive N/A N/A Comorbid History: Pulmonary Disease (COPD), Coronary Artery Disease, Deep Vein Thrombosis, Hypertension, Peripheral Venous Disease, Osteoarthritis, Received Chemotherapy, Received Radiation 04/03/2022 N/A N/A Date Acquired: 4 N/A N/A Weeks of Treatment: Open N/A N/A Wound Status: No N/A N/A Wound Recurrence: 1.1x0.7x0.1 N/A N/A Measurements L x W x D (cm) 0.605 N/A N/A A (cm) : rea 0.06 N/A N/A Volume (cm) : 63.30% N/A N/A % Reduction in A rea: 63.60% N/A N/A % Reduction in Volume: Full Thickness Without Exposed N/A N/A Classification: Support Structures Medium N/A N/A Exudate A mount: Serosanguineous N/A N/A Exudate Type: red, brown N/A N/A Exudate Color: Debridement - Selective/Open Wound N/A N/A Debridement: Pre-procedure Verification/Time  Out 12:03 N/A N/A Taken: Lidocaine 5% topical ointment N/A N/A Pain Control: Necrotic/Eschar N/A N/A Tissue Debrided: Non-Viable Tissue N/A N/A Level: 0.77 N/A N/A Debridement A (sq cm): rea Curette N/A N/A Instrument: Minimum N/A N/A Bleeding: Pressure N/A N/A Hemostasis A chieved: 0 N/A N/A Procedural Pain: 0 N/A N/A Post Procedural Pain: Procedure was tolerated well N/A N/A Debridement Treatment Response: 1.1x0.7x0.1 N/A N/A Post Debridement Measurements L x W x D (cm) 0.06 N/A N/A Post Debridement Volume: (cm) Compression Therapy N/A N/A Procedures Performed: Debridement Treatment Notes Electronic Signature(s) Signed: 05/06/2022 12:08:03 PM By: [name] MD FACS Entered By: [name] on 05/06/2022 12:08:03 -------------------------------------------------------------------------------- Multi-Disciplinary Care Plan Details Patient Name: Date of Service: [name], Eluterio G. 05/06/2022 11:00 A M Medical Record Number: [id number] Patient Account Number: [id number] Date of Birth/Sex: Treating RN: 11-Apr-1935 (87 y.o. Collene Gobble Primary Care Daniell Paradise: Haynes Hoehn Other Clinician: Referring Saige Canton: Treating Shealyn Sean/Extender: Mellody Drown in Treatment: [place] reviewed with physician Active Inactive Abuse / Safety / Falls / Self Care Management Nursing Diagnoses: Impaired physical mobility Potential for falls Goals: Patient/caregiver will verbalize understanding of skin care regimen Date Initiated: 03/24/2022 Target Resolution Date: 11/14/2022 Goal Status: Active Patient/caregiver will verbalize/demonstrate measures taken to improve the patient's personal safety Sampey, Issam G ([id number]) [id number] Page 4 of 7 Date Initiated: 03/24/2022 Target Resolution Date: 11/14/2022 Goal Status: Active Interventions: Assess fall risk on admission and as needed Notes: Venous Leg  Ulcer Nursing Diagnoses: Knowledge deficit related to disease process and management Potential for venous Insuffiency (use before diagnosis confirmed) Goals: Patient will maintain optimal edema control Date Initiated: 04/08/2022 Target Resolution Date: 11/14/2022 Goal Status: Active Interventions: Assess peripheral edema status every visit. Compression as ordered Treatment Activities: Therapeutic compression applied : 04/08/2022 Notes: Wound/Skin Impairment Nursing Diagnoses: Impaired tissue integrity Knowledge deficit related to ulceration/compromised skin integrity Goals: Patient/caregiver will verbalize understanding of skin care regimen Date Initiated: 04/08/2022 Target Resolution Date: 11/14/2022 Goal Status: Active Ulcer/skin breakdown will have a volume reduction of 30% by week 4 Date Initiated: 04/08/2022 Target Resolution Date: 11/14/2022 Goal Status: Active Interventions: Assess patient/caregiver ability to obtain necessary supplies Assess patient/caregiver ability to perform ulcer/skin care regimen upon admission and as needed Assess ulceration(s) every visit Provide education on ulcer and skin care Treatment Activities: Skin care regimen initiated : 04/08/2022 Topical wound management initiated : 04/08/2022 Notes: Electronic Signature(s) Signed: 05/06/2022 6:13:20 PM By: Dellie Catholic RN Entered By: Dellie Catholic on 05/06/2022 17:54:39 -------------------------------------------------------------------------------- Pain Assessment Details Patient Name: Date of Service: [name], Areli G. 05/06/2022 11:00 A M Medical Record  Number: [id number] Patient Account Number: [id number] Date of Birth/Sex: Treating RN: [birth year] (87 y.o. M) Primary Care Hjalmar Ballengee: Haynes Hoehn Other Clinician: Referring Arla Boutwell: Treating Welborn Keena/Extender: Mellody Drown in Treatment: 6 Active Problems Location of Pain Severity and Description of Pain Patient Has Paino  No Site Locations [place], [place] G ([id number]) [id number] Page 5 of 7 Pain Management and Medication Current Pain Management: Electronic Signature(s) Signed: 05/06/2022 12:08:21 PM By: Worthy Rancher Entered By: Worthy Rancher on 05/06/2022 11:33:47 -------------------------------------------------------------------------------- Patient/Caregiver Education Details Patient Name: Date of Service: [name], Aeden G. 3/27/2024andnbsp11:00 A M Medical Record Number: [id number] Patient Account Number: [id number] Date of Birth/Gender: Treating RN: 04-30-1935 (87 y.o. Collene Gobble Primary Care Physician: Haynes Hoehn Other Clinician: Referring Physician: Treating Physician/Extender: Mellody Drown in Treatment: 6 Education Assessment Education Provided To: Patient Education Topics Provided Wound/Skin Impairment: Methods: Explain/Verbal Responses: Return demonstration correctly Electronic Signature(s) Signed: 05/06/2022 6:13:20 PM By: Dellie Catholic RN Entered By: Dellie Catholic on 05/06/2022 17:54:52 -------------------------------------------------------------------------------- Wound Assessment Details Patient Name: Date of Service: [name], Evens G. 05/06/2022 11:00 A M Medical Record Number: [id number] Patient Account Number: [id number] Date of Birth/Sex: Treating RN: 04/13/1935 (87 y.o. M) Primary Care Clemmie Buelna: Haynes Hoehn Other Clinician: Referring Amberleigh Gerken: Treating Maryan Sivak/Extender: Mellody Drown in Treatment: 6 Wound Status Wound Number: 11 Primary Venous Leg Ulcer Etiology: Wound Location: Right, Lateral Lower Leg Wound Open Wounding Event: Gradually Appeared Status: Date Acquired: 04/03/2022 Comorbid Cataracts, Chronic Obstructive Pulmonary Disease (COPD), Weeks Of Treatment: 4 History: Coronary Artery Disease, Deep Vein Thrombosis, Hypertension, Clustered Wound: No Peripheral Venous Disease,  Osteoarthritis, Received Chemotherapy, Pines, Jermarcus G ([id number] Page 6 of 7 Received Radiation Photos Wound Measurements Length: (cm) 1.1 Width: (cm) 0.7 Depth: (cm) 0.1 Area: (cm) 0.605 Volume: (cm) 0.06 % Reduction in Area: 63.3% % Reduction in Volume: 63.6% Wound Description Classification: Full Thickness Without Exposed Suppor Exudate Amount: Medium Exudate Type: Serosanguineous Exudate Color: red, brown t Structures Periwound Skin Texture Texture Color No Abnormalities Noted: No No Abnormalities Noted: No Moisture No Abnormalities Noted: No Treatment Notes Wound #11 (Lower Leg) Wound Laterality: Right, Lateral Cleanser Peri-Wound Care Sween Lotion (Moisturizing lotion) Discharge Instruction: Apply moisturizing lotion as directed Topical Gentamicin Discharge Instruction: As directed by physician Primary Dressing Promogran Prisma Matrix, 4.34 (sq in) (silver collagen) Discharge Instruction: Moisten collagen with saline or hydrogel Secondary Dressing Woven Gauze Sponge, Non-Sterile 4x4 in Discharge Instruction: Apply over primary dressing as directed. Secured With Compression Wrap ThreePress (3 layer compression wrap) Discharge Instruction: Apply three layer compression as directed. Compression Stockings Add-Ons Electronic Signature(s) Signed: 05/06/2022 6:13:20 PM By: Dellie Catholic RN Entered By: Dellie Catholic on 05/06/2022 11:59:55 Morton, Hurshel G ([id number] Page 7 of 7 -------------------------------------------------------------------------------- Vitals Details Patient Name: Date of Service: [name], Doris G. 05/06/2022 11:00 A M Medical Record Number: [id number] Patient Account Number: [id number] Date of Birth/Sex: Treating RN: 12/02/1935 (87 y.o. M) Primary Care [place] Flett: Haynes Hoehn Other Clinician: Referring Starasia Sinko: Treating Eddis Pingleton/Extender: Mellody Drown in Treatment: 6 Vital Signs Time Taken: 11:33 Temperature (F): 97.8 Height (in): 75 Pulse (bpm): 61 Weight (lbs): 257 Respiratory Rate (breaths/min): 20 Body Mass Index (BMI): 32.1 Blood Pressure (mmHg): 163/83 Reference Range: 80 - 120 mg / dl Electronic Signature(s) Signed: 05/06/2022 12:08:21 PM By: Worthy Rancher Entered By: Worthy Rancher on 05/06/2022 11:33:30

## 2022-05-07 NOTE — Progress Notes (Signed)
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 Quadriplegia; Paraplegia; Seizure Disorder Oncologic Medical History: Positive for: Received Chemotherapy - 2015; Received Radiation - 2015 Past Medical History Notes: ALDIS, LAMORTE ([id number]) [id number] Page 11 of 12 hx colon CA Psychiatric Medical History: Negative for: Anorexia/bulimia; Confinement Anxiety HBO Extended History Items Eyes: Cataracts Immunizations Pneumococcal Vaccine: Received Pneumococcal Vaccination: Yes Received Pneumococcal Vaccination On or After 60th Birthday: Yes Implantable Devices None Hospitalization / Surgery History Type of Hospitalization/Surgery colon resection umbilical hernia repair Family and Social History Cancer: No; Diabetes: No; Heart Disease: No; Hereditary Spherocytosis: No; Hypertension: No; Kidney Disease: No; Lung Disease: No; Seizures: No; Stroke: No; Thyroid Problems: No; Tuberculosis: No; Former smoker; Marital Status - Married; Alcohol Use: Rarely; Drug Use: No History; Caffeine Use: Daily; Financial Concerns: No; Food, Clothing or Shelter Needs: No; Support System Lacking: No; Transportation Concerns: No Electronic Signature(s) Signed: 05/06/2022 12:36:47 PM By: [name] MD FACS Entered By: [name] on 05/06/2022 12:08:48 -------------------------------------------------------------------------------- SuperBill Details Patient Name: Date of Service: [name], Tavarius G. 05/06/2022 Medical Record Number: [id number] Patient Account Number: [id number] Date of Birth/Sex: Treating RN: February 05, 1936 (87 y.o. 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 I10 Essential (primary) hypertension I87.8 Other specified disorders of veins Facility Procedures : CPT4 Code: [id number] Description: [id number] - DEBRIDE WOUND 1ST 20 SQ CM OR < ICD-10 Diagnosis Description L97.812 Non-pressure chronic ulcer of other part of right lower leg with fat layer expos Modifier: ed Quantity: 1 Physician Procedures : CPT4 Code Description Modifier [id number] - WC PHYS LEVEL 4 - EST PT 25 ICD-10 Diagnosis Description L97.812 Non-pressure chronic ulcer of other part of right lower leg with fat layer exposed I87.8 Other specified disorders of veins Etzkorn, Lavelle G  ([id number]) [id number] J44.9 Chronic obstructive pulmonary disease, unspecified 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

## 2022-05-13 ENCOUNTER — Encounter (HOSPITAL_BASED_OUTPATIENT_CLINIC_OR_DEPARTMENT_OTHER): Payer: Medicare Other | Admitting: General Surgery

## 2022-05-15 ENCOUNTER — Encounter (HOSPITAL_BASED_OUTPATIENT_CLINIC_OR_DEPARTMENT_OTHER): Payer: Medicare Other | Attending: General Surgery | Admitting: General Surgery

## 2022-05-15 DIAGNOSIS — B353 Tinea pedis: Secondary | ICD-10-CM | POA: Insufficient documentation

## 2022-05-15 DIAGNOSIS — I10 Essential (primary) hypertension: Secondary | ICD-10-CM | POA: Insufficient documentation

## 2022-05-15 DIAGNOSIS — Z09 Encounter for follow-up examination after completed treatment for conditions other than malignant neoplasm: Secondary | ICD-10-CM | POA: Insufficient documentation

## 2022-05-15 DIAGNOSIS — Z86718 Personal history of other venous thrombosis and embolism: Secondary | ICD-10-CM | POA: Insufficient documentation

## 2022-05-15 DIAGNOSIS — D692 Other nonthrombocytopenic purpura: Secondary | ICD-10-CM | POA: Insufficient documentation

## 2022-05-15 DIAGNOSIS — I890 Lymphedema, not elsewhere classified: Secondary | ICD-10-CM | POA: Diagnosis not present

## 2022-05-15 DIAGNOSIS — I872 Venous insufficiency (chronic) (peripheral): Secondary | ICD-10-CM | POA: Insufficient documentation

## 2022-05-15 DIAGNOSIS — Z85828 Personal history of other malignant neoplasm of skin: Secondary | ICD-10-CM | POA: Insufficient documentation

## 2022-05-15 DIAGNOSIS — M1990 Unspecified osteoarthritis, unspecified site: Secondary | ICD-10-CM | POA: Diagnosis not present

## 2022-05-15 DIAGNOSIS — Z7901 Long term (current) use of anticoagulants: Secondary | ICD-10-CM | POA: Insufficient documentation

## 2022-05-15 DIAGNOSIS — I2510 Atherosclerotic heart disease of native coronary artery without angina pectoris: Secondary | ICD-10-CM | POA: Diagnosis not present

## 2022-05-15 DIAGNOSIS — Z955 Presence of coronary angioplasty implant and graft: Secondary | ICD-10-CM | POA: Insufficient documentation

## 2022-05-15 DIAGNOSIS — J449 Chronic obstructive pulmonary disease, unspecified: Secondary | ICD-10-CM | POA: Insufficient documentation

## 2022-05-15 DIAGNOSIS — Z87891 Personal history of nicotine dependence: Secondary | ICD-10-CM | POA: Diagnosis not present

## 2022-05-15 DIAGNOSIS — C189 Malignant neoplasm of colon, unspecified: Secondary | ICD-10-CM | POA: Diagnosis not present

## 2022-05-15 DIAGNOSIS — Z08 Encounter for follow-up examination after completed treatment for malignant neoplasm: Secondary | ICD-10-CM | POA: Insufficient documentation

## 2022-05-15 DIAGNOSIS — Z923 Personal history of irradiation: Secondary | ICD-10-CM | POA: Insufficient documentation

## 2022-05-15 DIAGNOSIS — L97812 Non-pressure chronic ulcer of other part of right lower leg with fat layer exposed: Secondary | ICD-10-CM | POA: Diagnosis present

## 2022-05-15 NOTE — Progress Notes (Signed)
TOVIA, PEDEN ([id number]) [id number] Page 1 of 2 Visit Report for 05/15/2022 Physician Orders Details Patient Name: Date of Service: [name], Hershey G. 05/15/2022 7:30 A M Medical Record Number: [id number] Patient Account Number: [id number] Date of Birth/Sex: Treating RN: Apr 05, 1935 (87 y.o. 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 Anesthetic (In clinic) Topical Lidocaine 4% applied to wound bed Bathing/ Shower/ Hygiene May shower and wash wound with soap and water. Edema Control - Lymphedema / SCD / Other Elevate legs to the level of the heart or above for 30 minutes daily and/or when sitting for 3-4 times a day throughout the day. Patient to wear own compression stockings every day. Moisturize legs daily. Non Wound Condition pply the following to affected area as directed: - keep the right great toe and right foot dry, cotton balls between the toes are okay to use A Electronic Signature(s) Unsigned Entered By: Zenaida Deed on 05/15/2022 08:00:46 -------------------------------------------------------------------------------- Problem List Details Patient Name: Date of Service: [name], Tavari G. 05/15/2022 7:30 A M Medical Record Number: [id number] Patient Account Number: [id number] Date of Birth/Sex: Treating RN: January 06, 1936 (87 y.o. 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

## 2022-05-18 NOTE — Progress Notes (Signed)
Karma GreaserSANDO, [name] ([id number]) [id number] Page 1 of 7 Visit Report for 05/15/2022 Arrival Information Details Patient Name: Date of Service: [name], [name]. 05/15/2022 7:30 A M Medical Record Number: [id number] Patient Account Number: [id number] Date of Birth/Sex: Treating RN: 07/25/1935 (87 y.o. [name]) Boehlein, [name] Primary Care [name]: [name] AgeeWeeks, [name] Other Clinician: Referring Gorman Safi: Treating Ellawyn Wogan/Extender: Wannetta Senderannon, Jennifer [name], [name] in Treatment: 7 Visit Information History Since Last Visit Added or deleted any medications: No Patient Arrived: Wheel Chair Any new allergies or adverse reactions: No Arrival Time: 07:45 Had a fall or experienced change in No Accompanied By: spouse activities of daily living that may affect Transfer Assistance: None risk of falls: Patient Identification Verified: Yes Signs or symptoms of abuse/neglect since last visito No Secondary Verification Process Completed: Yes Hospitalized since last visit: No Patient Requires Transmission-Based Precautions: No Implantable device outside of the clinic excluding No Patient Has Alerts: No cellular tissue based products placed in the center since last visit: Has Dressing in Place as Prescribed: Yes Has Compression in Place as Prescribed: Yes Pain Present Now: No Electronic Signature(s) Signed: 05/18/2022 4:22:17 PM By: Zenaida DeedBoehlein, Linda RN, BSN Entered By: Zenaida DeedBoehlein, Linda on 05/15/2022 07:45:35 -------------------------------------------------------------------------------- Clinic Level of Care Assessment Details Patient Name: Date of Service: [name], [name]. 05/15/2022 7:30 A M Medical Record Number: [id number] Patient Account Number: [id number] Date of Birth/Sex: Treating RN: 05/11/1935 (87 y.o. [name]) Boehlein, Linda Primary Care Clearance Chenault: [name] AgeeWeeks, [name] Other Clinician: Referring Jarelis Ehlert: Treating Bueford Arp/Extender: Wannetta Senderannon, Jennifer [name], [name] in Treatment: 7 Clinic  Level of Care Assessment Items TOOL 4 Quantity Score []  - 0 Use when only an EandM is performed on FOLLOW-UP visit ASSESSMENTS - Nursing Assessment / Reassessment X- 1 10 Reassessment of Co-morbidities (includes updates in patient status) X- 1 5 Reassessment of Adherence to Treatment Plan ASSESSMENTS - Wound and Skin A ssessment / Reassessment X - Simple Wound Assessment / Reassessment - one wound 1 5 []  - 0 Complex Wound Assessment / Reassessment - multiple wounds []  - 0 Dermatologic / Skin Assessment (not related to wound area) ASSESSMENTS - Focused Assessment X- 1 5 Circumferential Edema Measurements - multi extremities []  - 0 Nutritional Assessment / Counseling / Intervention X- 1 5 Lower Extremity Assessment (monofilament, tuning fork, pulses) []  - 0 Peripheral Arterial Disease Assessment (using hand held doppler) ASSESSMENTS - Ostomy and/or Continence Assessment and Care []  - 0 Incontinence Assessment and Management []  - 0 Ostomy Care Assessment and Management (repouching, etc.) PROCESS - Coordination of Care [name], [name] ([id number]) [id number] Page 2 of 7 X- 1 15 Simple Patient / Family Education for ongoing care []  - 0 Complex (extensive) Patient / Family Education for ongoing care X- 1 10 Staff obtains ChiropractorConsents, Records, T Results / Process Orders est []  - 0 Staff telephones HHA, Nursing Homes / Clarify orders / etc []  - 0 Routine Transfer to another Facility (non-emergent condition) []  - 0 Routine Hospital Admission (non-emergent condition) []  - 0 New Admissions / Manufacturing engineernsurance Authorizations / Ordering NPWT Apligraf, etc. , []  - 0 Emergency Hospital Admission (emergent condition) X- 1 10 Simple Discharge Coordination []  - 0 Complex (extensive) Discharge Coordination PROCESS - Special Needs []  - 0 Pediatric / Minor Patient Management []  - 0 Isolation Patient Management []  - 0 Hearing / Language / Visual special needs []  -  0 Assessment of Community assistance (transportation, D/C planning, etc.) []  - 0 Additional assistance / Altered mentation []  - 0 Support Surface(s) Assessment (bed, cushion, seat, etc.) INTERVENTIONS -  Wound Cleansing / Measurement X - Simple Wound Cleansing - one wound 1 5 []  - 0 Complex Wound Cleansing - multiple wounds X- 1 5 Wound Imaging (photographs - any number of wounds) []  - 0 Wound Tracing (instead of photographs) []  - 0 Simple Wound Measurement - one wound []  - 0 Complex Wound Measurement - multiple wounds INTERVENTIONS - Wound Dressings []  - 0 Small Wound Dressing one or multiple wounds []  - 0 Medium Wound Dressing one or multiple wounds []  - 0 Large Wound Dressing one or multiple wounds []  - 0 Application of Medications - topical []  - 0 Application of Medications - injection INTERVENTIONS - Miscellaneous []  - 0 External ear exam []  - 0 Specimen Collection (cultures, biopsies, blood, body fluids, etc.) []  - 0 Specimen(s) / Culture(s) sent or taken to Lab for analysis []  - 0 Patient Transfer (multiple staff / Michiel Sites Lift / Similar devices) []  - 0 Simple Staple / Suture removal (25 or less) []  - 0 Complex Staple / Suture removal (26 or more) []  - 0 Hypo / Hyperglycemic Management (close monitor of Blood Glucose) []  - 0 Ankle / Brachial Index (ABI) - do not check if billed separately X- 1 5 Vital Signs Has the patient been seen at the hospital within the last three years: Yes Total Score: 80 Level Of Care: New/Established - Level 3 Electronic Signature(s) Signed: 05/18/2022 4:22:17 PM By: Zenaida Deed RN, BSN [name], [name] ([id number]) [id number] Page 3 of 7 Entered By: Zenaida Deed on 05/15/2022 08:08:48 -------------------------------------------------------------------------------- Encounter Discharge Information Details Patient Name: Date of Service: [name], [name]. 05/15/2022 7:30 A M Medical Record Number:  [id number] Patient Account Number: [id number] Date of Birth/Sex: Treating RN: 28-Jan-1936 (87 y.o. [name] Primary Care Macky Galik: [name] Other Clinician: Referring Jarmaine Ehrler: Treating [name]/Extender: Wannetta Sender in Treatment: 7 Encounter Discharge Information Items Discharge Condition: Stable Ambulatory Status: Wheelchair Discharge Destination: Home Transportation: Private Auto Accompanied By: spouse Schedule Follow-up Appointment: Yes Clinical Summary of Care: Patient Declined Electronic Signature(s) Signed: 05/18/2022 4:22:17 PM By: Zenaida Deed RN, BSN Entered By: Zenaida Deed on 05/15/2022 08:10:16 -------------------------------------------------------------------------------- Lower Extremity Assessment Details Patient Name: Date of Service: [name], [name]. 05/15/2022 7:30 A M Medical Record Number: [id number] Patient Account Number: [id number] Date of Birth/Sex: Treating RN: 08/01/1935 (87 y.o. [name] Primary Care Jawana Reagor: [name] Other Clinician: Referring Brihana Quickel: Treating Zaevion Parke/Extender: Wannetta Sender in Treatment: 7 Edema Assessment Assessed: Kyra Searles: No] [Right: No] Edema: [Left: Ye] [Right: s] Calf Left: Right: Point of Measurement: From Medial Instep 35.5 cm Ankle Left: Right: Point of Measurement: From Medial Instep 24 cm Vascular Assessment Pulses: Dorsalis Pedis Palpable: [Right:Yes] Electronic Signature(s) Signed: 05/18/2022 4:22:17 PM By: Zenaida Deed RN, BSN Entered By: Zenaida Deed on 05/15/2022 07:51:00 -------------------------------------------------------------------------------- Multi Wound Chart Details Patient Name: Date of Service: [name], Nedim [name]. 05/15/2022 7:30 A M Medical Record Number: [id number] Patient Account Number: [id number] Date of Birth/Sex: Treating RN: 1935/08/31 (87 y.o. [name] Primary Care Medford Staheli: [name] Other  Clinician: Referring Dwayne Begay: Treating Liyat Faulkenberry/Extender: Sione, Meinholz, [name] ([id number]) [id number] Page 4 of 7 [name] in Treatment: 7 Vital Signs Height(in): 75 Pulse(bpm): 62 Weight(lbs): 257 Blood Pressure(mmHg): 164/80 Body Mass Index(BMI): 32.1 Temperature(F): 98.6 Respiratory Rate(breaths/min): 18 [11:Photos:] [N/A:N/A] Right, Lateral Lower Leg N/A N/A Wound Location: Gradually Appeared N/A N/A Wounding Event: Venous Leg Ulcer N/A N/A Primary Etiology: Cataracts, Chronic Obstructive N/A N/A Comorbid History: Pulmonary Disease (COPD), Coronary  Artery Disease, Deep Vein Thrombosis, Hypertension, Peripheral Venous Disease, Osteoarthritis, Received Chemotherapy, Received Radiation 04/03/2022 N/A N/A Date Acquired: 5 N/A N/A [name] of Treatment: Open N/A N/A Wound Status: No N/A N/A Wound Recurrence: 0x0x0 N/A N/A Measurements L x W x D (cm) 0 N/A N/A A (cm) : rea 0 N/A N/A Volume (cm) : 100.00% N/A N/A % Reduction in Area: 100.00% N/A N/A % Reduction in Volume: Full Thickness Without Exposed N/A N/A Classification: Support Structures None Present N/A N/A Exudate Amount: None Present (0%) N/A N/A Granulation Amount: None Present (0%) N/A N/A Necrotic Amount: Fascia: No N/A N/A Exposed Structures: Fat Layer (Subcutaneous Tissue): No Tendon: No Muscle: No Joint: No Bone: No Large (67-100%) N/A N/A Epithelialization: No Abnormalities Noted N/A N/A Periwound Skin Texture: No Abnormalities Noted N/A N/A Periwound Skin Moisture: Hemosiderin Staining: Yes N/A N/A Periwound Skin Color: No Abnormality N/A N/A Temperature: Treatment Notes Electronic Signature(s) Signed: 05/15/2022 8:00:40 AM By: Duanne Guess MD FACS Signed: 05/18/2022 4:22:17 PM By: Zenaida Deed RN, BSN Entered By: Duanne Guess on 05/15/2022  08:00:39 -------------------------------------------------------------------------------- Multi-Disciplinary Care Plan Details Patient Name: Date of Service: [name], [name]. 05/15/2022 7:30 A M Medical Record Number: [id number] Patient Account Number: [id number] Date of Birth/Sex: Treating RN: [birth year] (87 y.o. [name] Primary Care Barbette Mcglaun: [name] Other Clinician: Referring Sahian Kerney: Treating [name]/Extender: Wannetta Sender in Treatment: 7 Multidisciplinary Care Plan reviewed with physician Meaders, Cira Servant ([id number]) [id number] Page 5 of 7 Active Inactive Electronic Signature(s) Signed: 05/18/2022 4:22:17 PM By: Zenaida Deed RN, BSN Entered By: Zenaida Deed on 05/15/2022 07:56:25 -------------------------------------------------------------------------------- Pain Assessment Details Patient Name: Date of Service: [name], [name]. 05/15/2022 7:30 A M Medical Record Number: [id number] Patient Account Number: [id number] Date of Birth/Sex: Treating RN: 01/02/1936 (87 y.o. [name] Primary Care Jakeline Dave: [name] Other Clinician: Referring Cosandra Plouffe: Treating Reaghan Kawa/Extender: Wannetta Sender in Treatment: 7 Active Problems Location of Pain Severity and Description of Pain Patient Has Paino No Site Locations Rate the pain. Current Pain Level: 0 Pain Management and Medication Current Pain Management: Electronic Signature(s) Signed: 05/18/2022 4:22:17 PM By: Zenaida Deed RN, BSN Entered By: Zenaida Deed on 05/15/2022 07:46:25 -------------------------------------------------------------------------------- Patient/Caregiver Education Details Patient Name: Date of Service: [name], Juana [name]. 4/5/2024andnbsp7:30 A M Medical Record Number: [id number] Patient Account Number: [id number] Date of Birth/Gender: Treating RN: October 02, 1935 (87 y.o. [name] Primary Care Physician:  [name] Other Clinician: Referring Physician: Treating Physician/Extender: Wannetta Sender in Treatment: 7 Education Assessment Education Provided To: Patient Education Topics Provided Venous: Methods: Explain/Verbal Responses: Reinforcements needed, State content correctly [name], [name] ([id number]) [id number] Page 6 of 7 Wound/Skin Impairment: Methods: Explain/Verbal Responses: Reinforcements needed, State content correctly Electronic Signature(s) Signed: 05/18/2022 4:22:17 PM By: Zenaida Deed RN, BSN Entered By: Zenaida Deed on 05/15/2022 07:57:05 -------------------------------------------------------------------------------- Wound Assessment Details Patient Name: Date of Service: [name], [name]. 05/15/2022 7:30 A M Medical Record Number: [id number] Patient Account Number: [id number] Date of Birth/Sex: Treating RN: February 12, 1935 (87 y.o. [name] Primary Care Zarayah Lanting: [name] Other Clinician: Referring [name]: Treating Clennon Nasca/Extender: Wannetta Sender in Treatment: 7 Wound Status Wound Number: [id number] Primary Venous Leg Ulcer Etiology: Wound Location: Right, Lateral Lower Leg Wound Open Wounding Event: Gradually Appeared Status: Date Acquired: 04/03/2022 Comorbid Cataracts, Chronic Obstructive Pulmonary Disease (COPD), [name] Of Treatment: 5 History: Coronary Artery Disease, Deep Vein Thrombosis, Hypertension, Clustered Wound: No Peripheral Venous Disease, Osteoarthritis, Received Chemotherapy, Received Radiation Photos  Wound Measurements Length: (cm) Width: (cm) Depth: (cm) Area: (cm) Volume: (cm) 0 % Reduction in Area: 100% 0 % Reduction in Volume: 100% 0 Epithelialization: Large (67-100%) 0 Tunneling: No 0 Undermining: No Wound Description Classification: Full Thickness Without Exposed Support Structures Exudate Amount: None Present Foul Odor After Cleansing:  No Slough/Fibrino No Wound Bed Granulation Amount: None Present (0%) Exposed Structure Necrotic Amount: None Present (0%) Fascia Exposed: No Fat Layer (Subcutaneous Tissue) Exposed: No Tendon Exposed: No Muscle Exposed: No Joint Exposed: No Bone Exposed: No Periwound Skin Texture Texture Color No Abnormalities Noted: Yes No Abnormalities Noted: No Hemosiderin Staining: Yes Moisture No Abnormalities Noted: Yes Temperature / Pain Temperature: No Abnormality Kilpatrick, [name] ([id number]) [id number] Page 7 of 7 Electronic Signature(s) Signed: 05/18/2022 4:22:17 PM By: Zenaida Deed RN, BSN Entered By: Zenaida Deed on 05/15/2022 07:55:51 -------------------------------------------------------------------------------- Vitals Details Patient Name: Date of Service: [name], [name]. 05/15/2022 7:30 A M Medical Record Number: [id number] Patient Account Number: [id number] Date of Birth/Sex: Treating RN: 01/10/1936 (87 y.o. [name] Primary Care Holt Woolbright: [name] Other Clinician: Referring Tamee Battin: Treating Kaulder Zahner/Extender: Wannetta Sender in Treatment: 7 Vital Signs Time Taken: 07:45 Temperature (F): 98.6 Height (in): 75 Pulse (bpm): 62 Weight (lbs): 257 Respiratory Rate (breaths/min): 18 Body Mass Index (BMI): 32.1 Blood Pressure (mmHg): 164/80 Reference Range: 80 - 120 mg / dl Electronic Signature(s) Signed: 05/18/2022 4:22:17 PM By: Zenaida Deed RN, BSN Entered By: Zenaida Deed on 05/15/2022 07:46:18

## 2022-05-28 NOTE — Progress Notes (Signed)
[name], [name] ([id number]) [id number] Page 1 of 7 Visit Report for 04/22/2022 Arrival Information Details Patient Name: Date of Service: [name], [name]. 04/22/2022 9:30 A M Medical Record Number: [id number] Patient Account Number: [id number] Date of Birth/Sex: Treating RN: [birth year] (87 y.o. [name] Primary Care [name]: [name] Other Clinician: Referring [name]: Treating [name]/Extender: [name] in Treatment: 4 Visit Information History Since Last Visit All ordered tests and consults were completed: Yes Patient Arrived: [name] Added or deleted any medications: No Arrival Time: 09:31 Any new allergies or adverse reactions: No Accompanied By: spouse Had a fall or experienced change in No Transfer Assistance: None activities of daily living that may affect Patient Identification Verified: Yes risk of falls: Secondary Verification Process Completed: Yes Signs or symptoms of abuse/neglect since last visito No Patient Requires Transmission-Based Precautions: No Hospitalized since last visit: No Patient Has Alerts: No Implantable device outside of the clinic excluding No cellular tissue based products placed in the center since last visit: Pain Present Now: Yes Electronic Signature(s) Signed: 05/28/2022 1:55:51 PM By: Brenton Grills Entered By: Brenton Grills on 04/22/2022 09:32:14 -------------------------------------------------------------------------------- Compression Therapy Details Patient Name: Date of Service: [name], Gean [name]. 04/22/2022 9:30 A M Medical Record Number: [id number] Patient Account Number: [id number] Date of Birth/Sex: Treating RN: August 13, 1935 (87 y.o. [name] Primary Care Jacara Benito: [name] Other Clinician: Referring [name]: Treating [name]/Extender: [name] in Treatment: 4 Compression Therapy Performed for Wound Assessment: Wound #11  Right,Lateral Lower Leg Performed By: Clinician [name] Deed, RN Compression Type: Three Layer Post Procedure Diagnosis Same as Pre-procedure Electronic Signature(s) Signed: 04/22/2022 11:58:58 AM By: [name] Deed RN, BSN Entered By: [name] on 04/22/2022 09:55:36 -------------------------------------------------------------------------------- Encounter Discharge Information Details Patient Name: Date of Service: [name], [name]. 04/22/2022 9:30 A M Medical Record Number: [id number] Patient Account Number: [id number] Date of Birth/Sex: Treating RN: [birth year] (87 y.o. [name] Primary Care Yussuf Sawyers: [name] Other Clinician: Referring Issacc Merlo: Treating Tzipporah Nagorski/Extender: [name] in Treatment: 4 Encounter Discharge Information Items Post Procedure Vitals Discharge Condition: Stable Temperature (F): 97.5 Ambulatory Status: Wheelchair Pulse (bpm): 85 Discharge Destination: Home Respiratory Rate (breaths/min): 18 Transportation: Private Auto Blood Pressure (mmHg): 153/73 Accompanied By: spouse [name] ([id number]) [id number] Page 2 of 7 Schedule Follow-up Appointment: Yes Clinical Summary of Care: Patient Declined Electronic Signature(s) Signed: 05/28/2022 1:55:51 PM By: Brenton Grills Entered By: Brenton Grills on 04/22/2022 10:21:01 -------------------------------------------------------------------------------- Lower Extremity Assessment Details Patient Name: Date of Service: [name], [name]. 04/22/2022 9:30 A M Medical Record Number: [id number] Patient Account Number: [id number] Date of Birth/Sex: Treating RN: 01-18-1936 (87 y.o. [name] Primary Care Nashika Coker: [name] Other Clinician: Referring Wash Nienhaus: Treating Ajahnae Rathgeber/Extender: [name] in Treatment: 4 Edema Assessment Assessed: [name]: No] [name]: No] Edema: [Left: Ye] [Right: s] Calf Left:  Right: Point of Measurement: From Medial Instep 36.5 cm Ankle Left: Right: Point of Measurement: From Medial Instep 24.2 cm Vascular Assessment Pulses: Dorsalis Pedis Palpable: [Right:Yes] Electronic Signature(s) Signed: 05/28/2022 1:55:51 PM By: Brenton Grills Entered By: Brenton Grills on 04/22/2022 09:40:34 -------------------------------------------------------------------------------- Multi Wound Chart Details Patient Name: Date of Service: [name], [name]. 04/22/2022 9:30 A M Medical Record Number: [id number] Patient Account Number: [id number] Date of Birth/Sex: Treating RN: 10-08-1935 (87 y.o. M) Primary Care Keilee Denman: [name] Other Clinician: Referring Cordell Coke: Treating Angelyn Osterberg/Extender: [name] in Treatment: 4 Vital Signs Height(in): 75 Pulse(bpm): 85 Weight(lbs): 257 Blood  Pressure(mmHg): 153/73 Body Mass Index(BMI): 32.1 Temperature(F): 97.5 Respiratory Rate(breaths/min): 18 [11:Photos:] [N/A:N/A] Right, Lateral Lower Leg N/A N/A Wound Location: Gradually Appeared N/A N/A Wounding Event: Venous Leg Ulcer N/A N/A Primary Etiology: Cataracts, Chronic Obstructive N/A N/A Comorbid History: Pulmonary Disease (COPD), Coronary Artery Disease, Deep Vein Thrombosis, Hypertension, Peripheral Venous Disease, Osteoarthritis, Received Chemotherapy, Received Radiation 04/03/2022 N/A N/A Date Acquired: 2 N/A N/A Weeks of Treatment: Open N/A N/A Wound Status: No N/A N/A Wound Recurrence: 2.3x1.1x0.1 N/A N/A Measurements L x W x D (cm) 1.987 N/A N/A A (cm) : rea 0.199 N/A N/A Volume (cm) : -20.50% N/A N/A % Reduction in Area: -20.60% N/A N/A % Reduction in Volume: Full Thickness Without Exposed N/A N/A Classification: Support Structures Medium N/A N/A Exudate Amount: Serosanguineous N/A N/A Exudate Type: red, brown N/A N/A Exudate Color: Flat and Intact N/A N/A Wound Margin: Large (67-100%) N/A N/A Granulation  Amount: Pink N/A N/A Granulation Quality: Small (1-33%) N/A N/A Necrotic Amount: Fat Layer (Subcutaneous Tissue): Yes N/A N/A Exposed Structures: Fascia: No Tendon: No Muscle: No Joint: No Bone: No Small (1-33%) N/A N/A Epithelialization: No Abnormalities Noted N/A N/A Periwound Skin Texture: No Abnormalities Noted N/A N/A Periwound Skin Moisture: Hemosiderin Staining: Yes N/A N/A Periwound Skin Color: No Abnormality N/A N/A Temperature: Compression Therapy N/A N/A Procedures Performed: Treatment Notes Electronic Signature(s) Signed: 04/22/2022 11:58:37 AM By: Geralyn Corwin DO Entered By: Geralyn Corwin on 04/22/2022 09:57:38 -------------------------------------------------------------------------------- Multi-Disciplinary Care Plan Details Patient Name: Date of Service: [name], [name]. 04/22/2022 9:30 A M Medical Record Number: [id number] Patient Account Number: [id number] Date of Birth/Sex: Treating RN: [birth year] (87 y.o. [name] Primary Care Felix Pratt: [name] Other Clinician: Referring Niyah Mamaril: Treating Even Budlong/Extender: [name] in Treatment: 4 Multidisciplinary Care Plan reviewed with physician Active Inactive Abuse / Safety / Falls / Self Care Management Nursing Diagnoses: Impaired physical mobility Potential for falls Goals: Patient/caregiver will verbalize understanding of skin care regimen Date Initiated: 03/24/2022 Target Resolution Date: 05/15/2022 Goal Status: Active Patient/caregiver will verbalize/demonstrate measures taken to improve the patient's personal safety Date Initiated: 03/24/2022 Target Resolution Date: 05/15/2022 Goal Status: Active GARRISON, MICHIE ([id number]) [id number] Page 4 of 7 Interventions: Assess fall risk on admission and as needed Notes: Venous Leg Ulcer Nursing Diagnoses: Knowledge deficit related to disease process and management Potential for venous  Insuffiency (use before diagnosis confirmed) Goals: Patient will maintain optimal edema control Date Initiated: 04/08/2022 Target Resolution Date: 05/06/2022 Goal Status: Active Interventions: Assess peripheral edema status every visit. Compression as ordered Treatment Activities: Therapeutic compression applied : 04/08/2022 Notes: Wound/Skin Impairment Nursing Diagnoses: Impaired tissue integrity Knowledge deficit related to ulceration/compromised skin integrity Goals: Patient/caregiver will verbalize understanding of skin care regimen Date Initiated: 04/08/2022 Target Resolution Date: 05/06/2022 Goal Status: Active Ulcer/skin breakdown will have a volume reduction of 30% by week 4 Date Initiated: 04/08/2022 Target Resolution Date: 05/06/2022 Goal Status: Active Interventions: Assess patient/caregiver ability to obtain necessary supplies Assess patient/caregiver ability to perform ulcer/skin care regimen upon admission and as needed Assess ulceration(s) every visit Provide education on ulcer and skin care Treatment Activities: Skin care regimen initiated : 04/08/2022 Topical wound management initiated : 04/08/2022 Notes: Electronic Signature(s) Signed: 05/28/2022 1:55:51 PM By: Brenton Grills Entered By: Brenton Grills on 04/22/2022 09:50:04 -------------------------------------------------------------------------------- Pain Assessment Details Patient Name: Date of Service: [name], [name]. 04/22/2022 9:30 A M Medical Record Number: [id number] Patient Account Number: [id number] Date of Birth/Sex: Treating RN: 20-Feb-1935 (87 y.o. [name] Primary Care Milaina Sher: Tania Ade,  Darl Pikes Other Clinician: Referring Aris Even: Treating Cornella Emmer/Extender: [name] in Treatment: 4 Active Problems Location of Pain Severity and Description of Pain Patient Has Paino Yes Site Locations Pain LocationTEION, BALLIN ([id number])  [id number] Page 5 of 7 Pain Location: Generalized Pain Character of Pain Describe the Pain: Aching Pain Management and Medication Current Pain Management: Medication: Yes Rest: Yes Electronic Signature(s) Signed: 05/28/2022 1:55:51 PM By: Brenton Grills Entered By: Brenton Grills on 04/22/2022 09:39:58 -------------------------------------------------------------------------------- Patient/Caregiver Education Details Patient Name: Date of Service: [name], [name]. 3/13/2024andnbsp9:30 A M Medical Record Number: [id number] Patient Account Number: [id number] Date of Birth/Gender: Treating RN: 1935-08-15 (87 y.o. [name] Primary Care Physician: [name] Other Clinician: Referring Physician: Treating Physician/Extender: [name] in Treatment: 4 Education Assessment Education Provided To: Patient Education Topics Provided Venous: Methods: Explain/Verbal Responses: Reinforcements needed, State content correctly Wound/Skin Impairment: Methods: Explain/Verbal Responses: Reinforcements needed, State content correctly Electronic Signature(s) Signed: 04/22/2022 11:58:58 AM By: [name] Deed RN, BSN Entered By: [name] on 04/22/2022 09:51:27 -------------------------------------------------------------------------------- Wound Assessment Details Patient Name: Date of Service: [name], [name]. 04/22/2022 9:30 A M Medical Record Number: [id number] Patient Account Number: [id number] Date of Birth/Sex: Treating RN: March 24, 1935 (87 y.o. [name] Primary Care Geeta Dworkin: [name] Other Clinician: Referring Aundre Hietala: Treating Gehrig Patras/Extender: [name] in Treatment: 4 Idleman, Duilio [name] ([id number]) [id number] Page 6 of 7 Wound Status Wound Number: 11 Primary Venous Leg Ulcer Etiology: Wound Location: Right, Lateral Lower Leg Wound Open Wounding Event:  Gradually Appeared Status: Date Acquired: 04/03/2022 Comorbid Cataracts, Chronic Obstructive Pulmonary Disease (COPD), Weeks Of Treatment: 2 History: Coronary Artery Disease, Deep Vein Thrombosis, Hypertension, Clustered Wound: No Peripheral Venous Disease, Osteoarthritis, Received Chemotherapy, Received Radiation Photos Wound Measurements Length: (cm) 2.3 Width: (cm) 1.1 Depth: (cm) 0.1 Area: (cm) 1.987 Volume: (cm) 0.199 % Reduction in Area: -20.5% % Reduction in Volume: -20.6% Epithelialization: Small (1-33%) Tunneling: No Undermining: No Wound Description Classification: Full Thickness Without Exposed Support Structures Wound Margin: Flat and Intact Exudate Amount: Medium Exudate Type: Serosanguineous Exudate Color: red, brown Foul Odor After Cleansing: No Slough/Fibrino Yes Wound Bed Granulation Amount: Large (67-100%) Exposed Structure Granulation Quality: Pink Fascia Exposed: No Necrotic Amount: Small (1-33%) Fat Layer (Subcutaneous Tissue) Exposed: Yes Necrotic Quality: Adherent Slough Tendon Exposed: No Muscle Exposed: No Joint Exposed: No Bone Exposed: No Periwound Skin Texture Texture Color No Abnormalities Noted: Yes No Abnormalities Noted: No Hemosiderin Staining: Yes Moisture No Abnormalities Noted: Yes Temperature / Pain Temperature: No Abnormality Electronic Signature(s) Signed: 04/22/2022 11:58:58 AM By: [name] Deed RN, BSN Signed: 05/28/2022 1:55:51 PM By: Brenton Grills Entered By: [name] on 04/22/2022 09:48:16 -------------------------------------------------------------------------------- Vitals Details Patient Name: Date of Service: [name], Donavon [name]. 04/22/2022 9:30 A M Medical Record Number: [id number] Patient Account Number: [id number] Date of Birth/Sex: Treating RN: Feb 13, 1935 (87 y.o. [name] Primary Care Areliz Rothman: [name] Other Clinician: Referring Aylssa Herrig: Treating Averee Harb/Extender: [name] in Treatment: 4 Vital Signs [name], [name] ([id number]) [id number] Page 7 of 7 Time Taken: 09:30 Temperature (F): 97.5 Height (in): 75 Pulse (bpm): 85 Weight (lbs): 257 Respiratory Rate (breaths/min): 18 Body Mass Index (BMI): 32.1 Blood Pressure (mmHg): 153/73 Reference Range: 80 - 120 mg / dl Electronic Signature(s) Signed: 05/28/2022 1:55:51 PM By: Brenton Grills Entered By: Brenton Grills on 04/22/2022 09:33:02

## 2022-06-05 ENCOUNTER — Ambulatory Visit
Admission: RE | Admit: 2022-06-05 | Discharge: 2022-06-05 | Disposition: A | Discharge status: Home / Self Care | Payer: Medicare Other | Source: Ambulatory Visit | Attending: Pulmonary Disease | Admitting: Pulmonary Disease

## 2022-06-05 DIAGNOSIS — R911 Solitary pulmonary nodule: Secondary | ICD-10-CM

## 2022-06-09 ENCOUNTER — Telehealth: Payer: Self-pay | Admitting: Pulmonary Disease

## 2022-06-09 NOTE — Telephone Encounter (Signed)
I called and spoke with the pt's spouse, Gershon Cull, ok per DPR  I notified her of the results and recommendations from Dr Isaiah Serge  She verbalized understanding  She says that pt was just seen in consult by nephrology in [place], Dr Kinnie Scales His office is already working on scheduling renal US   Will forward to Dr. Isaiah Serge as Lorain Childes.

## 2022-06-09 NOTE — Telephone Encounter (Signed)
Please let patient know that CT shows stable lung nodules However there appears to be a kidney issue that needs further follow-up Please order ultrasound kidneys for further evaluation of hydronephrosis

## 2022-06-09 NOTE — Telephone Encounter (Signed)
Call Reports

## 2022-06-09 NOTE — Telephone Encounter (Signed)
Received call report on chest ct dated 06/05/22  IMPRESSION: Bilateral lung nodule seen previously are similar today going back to a study of December 2021. Long-term stability. No specific additional imaging follow-up of the lung nodules.   Underlying emphysematous lung changes.   However there is new collecting system dilatation of the kidneys at the edge of the imaging field of uncertain etiology. Recommend dedicated abdominal imaging to confirm etiology and correlate with known history.   Findings will be called to the ordering service by the Radiology physician assistant team   Aortic Atherosclerosis (ICD10-I70.0) and Emphysema (ICD10-J43.9).     Electronically Signed   By: Karen Kays M.D.   On: 06/09/2022 15:15   Routing to Dr Isaiah Serge as urgent and I have left detailed msg for [facility] that this was received.

## 2022-06-14 ENCOUNTER — Inpatient Hospital Stay (HOSPITAL_COMMUNITY)
Admission: EM | Admit: 2022-06-14 | Discharge: 2022-07-11 | DRG: 682 | Disposition: E | Payer: Medicare Other | Attending: Pulmonary Disease | Admitting: Pulmonary Disease

## 2022-06-14 ENCOUNTER — Other Ambulatory Visit: Payer: Self-pay

## 2022-06-14 ENCOUNTER — Emergency Department (HOSPITAL_COMMUNITY): Payer: Medicare Other

## 2022-06-14 ENCOUNTER — Encounter (HOSPITAL_COMMUNITY): Payer: Self-pay

## 2022-06-14 DIAGNOSIS — E669 Obesity, unspecified: Secondary | ICD-10-CM | POA: Diagnosis present

## 2022-06-14 DIAGNOSIS — N179 Acute kidney failure, unspecified: Principal | ICD-10-CM | POA: Diagnosis present

## 2022-06-14 DIAGNOSIS — D62 Acute posthemorrhagic anemia: Secondary | ICD-10-CM | POA: Diagnosis not present

## 2022-06-14 DIAGNOSIS — Z515 Encounter for palliative care: Secondary | ICD-10-CM

## 2022-06-14 DIAGNOSIS — D485 Neoplasm of uncertain behavior of skin: Secondary | ICD-10-CM | POA: Diagnosis not present

## 2022-06-14 DIAGNOSIS — I10 Essential (primary) hypertension: Secondary | ICD-10-CM | POA: Diagnosis present

## 2022-06-14 DIAGNOSIS — A0472 Enterocolitis due to Clostridium difficile, not specified as recurrent: Secondary | ICD-10-CM | POA: Insufficient documentation

## 2022-06-14 DIAGNOSIS — I700 Atherosclerosis of aorta: Secondary | ICD-10-CM | POA: Diagnosis present

## 2022-06-14 DIAGNOSIS — R627 Adult failure to thrive: Secondary | ICD-10-CM | POA: Diagnosis present

## 2022-06-14 DIAGNOSIS — J811 Chronic pulmonary edema: Secondary | ICD-10-CM | POA: Diagnosis present

## 2022-06-14 DIAGNOSIS — R579 Shock, unspecified: Secondary | ICD-10-CM | POA: Diagnosis not present

## 2022-06-14 DIAGNOSIS — Z66 Do not resuscitate: Secondary | ICD-10-CM | POA: Diagnosis present

## 2022-06-14 DIAGNOSIS — E872 Acidosis, unspecified: Secondary | ICD-10-CM | POA: Diagnosis present

## 2022-06-14 DIAGNOSIS — R197 Diarrhea, unspecified: Secondary | ICD-10-CM

## 2022-06-14 DIAGNOSIS — X58XXXA Exposure to other specified factors, initial encounter: Secondary | ICD-10-CM | POA: Diagnosis not present

## 2022-06-14 DIAGNOSIS — L03116 Cellulitis of left lower limb: Secondary | ICD-10-CM | POA: Diagnosis present

## 2022-06-14 DIAGNOSIS — Z884 Allergy status to anesthetic agent status: Secondary | ICD-10-CM

## 2022-06-14 DIAGNOSIS — E875 Hyperkalemia: Secondary | ICD-10-CM | POA: Diagnosis present

## 2022-06-14 DIAGNOSIS — E782 Mixed hyperlipidemia: Secondary | ICD-10-CM | POA: Diagnosis present

## 2022-06-14 DIAGNOSIS — C799 Secondary malignant neoplasm of unspecified site: Secondary | ICD-10-CM | POA: Diagnosis present

## 2022-06-14 DIAGNOSIS — N1339 Other hydronephrosis: Secondary | ICD-10-CM | POA: Diagnosis not present

## 2022-06-14 DIAGNOSIS — Z87442 Personal history of urinary calculi: Secondary | ICD-10-CM

## 2022-06-14 DIAGNOSIS — N3289 Other specified disorders of bladder: Secondary | ICD-10-CM | POA: Diagnosis not present

## 2022-06-14 DIAGNOSIS — E785 Hyperlipidemia, unspecified: Secondary | ICD-10-CM | POA: Diagnosis not present

## 2022-06-14 DIAGNOSIS — I468 Cardiac arrest due to other underlying condition: Secondary | ICD-10-CM | POA: Diagnosis not present

## 2022-06-14 DIAGNOSIS — Z79899 Other long term (current) drug therapy: Secondary | ICD-10-CM

## 2022-06-14 DIAGNOSIS — J9621 Acute and chronic respiratory failure with hypoxia: Secondary | ICD-10-CM | POA: Diagnosis present

## 2022-06-14 DIAGNOSIS — Z923 Personal history of irradiation: Secondary | ICD-10-CM

## 2022-06-14 DIAGNOSIS — Z85048 Personal history of other malignant neoplasm of rectum, rectosigmoid junction, and anus: Secondary | ICD-10-CM

## 2022-06-14 DIAGNOSIS — I5031 Acute diastolic (congestive) heart failure: Secondary | ICD-10-CM | POA: Diagnosis not present

## 2022-06-14 DIAGNOSIS — L309 Dermatitis, unspecified: Secondary | ICD-10-CM | POA: Diagnosis present

## 2022-06-14 DIAGNOSIS — Z6832 Body mass index (BMI) 32.0-32.9, adult: Secondary | ICD-10-CM

## 2022-06-14 DIAGNOSIS — N400 Enlarged prostate without lower urinary tract symptoms: Secondary | ICD-10-CM | POA: Diagnosis present

## 2022-06-14 DIAGNOSIS — S2241XA Multiple fractures of ribs, right side, initial encounter for closed fracture: Secondary | ICD-10-CM | POA: Diagnosis not present

## 2022-06-14 DIAGNOSIS — C679 Malignant neoplasm of bladder, unspecified: Secondary | ICD-10-CM | POA: Diagnosis present

## 2022-06-14 DIAGNOSIS — R578 Other shock: Secondary | ICD-10-CM | POA: Diagnosis not present

## 2022-06-14 DIAGNOSIS — J690 Pneumonitis due to inhalation of food and vomit: Secondary | ICD-10-CM | POA: Diagnosis not present

## 2022-06-14 DIAGNOSIS — R399 Unspecified symptoms and signs involving the genitourinary system: Secondary | ICD-10-CM | POA: Diagnosis not present

## 2022-06-14 DIAGNOSIS — Z9981 Dependence on supplemental oxygen: Secondary | ICD-10-CM | POA: Diagnosis not present

## 2022-06-14 DIAGNOSIS — C19 Malignant neoplasm of rectosigmoid junction: Secondary | ICD-10-CM | POA: Diagnosis present

## 2022-06-14 DIAGNOSIS — R1310 Dysphagia, unspecified: Secondary | ICD-10-CM | POA: Diagnosis present

## 2022-06-14 DIAGNOSIS — R159 Full incontinence of feces: Secondary | ICD-10-CM | POA: Diagnosis not present

## 2022-06-14 DIAGNOSIS — R1084 Generalized abdominal pain: Secondary | ICD-10-CM | POA: Diagnosis present

## 2022-06-14 DIAGNOSIS — N131 Hydronephrosis with ureteral stricture, not elsewhere classified: Secondary | ICD-10-CM | POA: Diagnosis present

## 2022-06-14 DIAGNOSIS — K432 Incisional hernia without obstruction or gangrene: Secondary | ICD-10-CM | POA: Diagnosis not present

## 2022-06-14 DIAGNOSIS — J449 Chronic obstructive pulmonary disease, unspecified: Secondary | ICD-10-CM | POA: Diagnosis present

## 2022-06-14 DIAGNOSIS — K529 Noninfective gastroenteritis and colitis, unspecified: Secondary | ICD-10-CM

## 2022-06-14 DIAGNOSIS — K920 Hematemesis: Secondary | ICD-10-CM | POA: Diagnosis not present

## 2022-06-14 DIAGNOSIS — N200 Calculus of kidney: Secondary | ICD-10-CM | POA: Diagnosis present

## 2022-06-14 DIAGNOSIS — E8770 Fluid overload, unspecified: Secondary | ICD-10-CM | POA: Diagnosis present

## 2022-06-14 DIAGNOSIS — Z955 Presence of coronary angioplasty implant and graft: Secondary | ICD-10-CM

## 2022-06-14 DIAGNOSIS — R310 Gross hematuria: Secondary | ICD-10-CM | POA: Diagnosis not present

## 2022-06-14 DIAGNOSIS — Z9221 Personal history of antineoplastic chemotherapy: Secondary | ICD-10-CM

## 2022-06-14 DIAGNOSIS — R110 Nausea: Secondary | ICD-10-CM

## 2022-06-14 DIAGNOSIS — Z7982 Long term (current) use of aspirin: Secondary | ICD-10-CM

## 2022-06-14 DIAGNOSIS — Z7951 Long term (current) use of inhaled steroids: Secondary | ICD-10-CM

## 2022-06-14 DIAGNOSIS — Z87891 Personal history of nicotine dependence: Secondary | ICD-10-CM

## 2022-06-14 DIAGNOSIS — J9601 Acute respiratory failure with hypoxia: Secondary | ICD-10-CM | POA: Diagnosis not present

## 2022-06-14 DIAGNOSIS — Z882 Allergy status to sulfonamides status: Secondary | ICD-10-CM

## 2022-06-14 DIAGNOSIS — J189 Pneumonia, unspecified organism: Secondary | ICD-10-CM | POA: Diagnosis not present

## 2022-06-14 DIAGNOSIS — I469 Cardiac arrest, cause unspecified: Secondary | ICD-10-CM | POA: Diagnosis not present

## 2022-06-14 DIAGNOSIS — Z9049 Acquired absence of other specified parts of digestive tract: Secondary | ICD-10-CM

## 2022-06-14 DIAGNOSIS — N281 Cyst of kidney, acquired: Secondary | ICD-10-CM | POA: Diagnosis present

## 2022-06-14 DIAGNOSIS — I2510 Atherosclerotic heart disease of native coronary artery without angina pectoris: Secondary | ICD-10-CM | POA: Diagnosis present

## 2022-06-14 LAB — URINALYSIS, ROUTINE W REFLEX MICROSCOPIC
Bilirubin Urine: NEGATIVE
Glucose, UA: NEGATIVE mg/dL
Ketones, ur: 5 mg/dL — AB
Nitrite: NEGATIVE
Protein, ur: 100 mg/dL — AB
RBC / HPF: >50 RBC/hpf (ref 0–5)
Specific Gravity, Urine: 1.011 (ref 1.005–1.030)
pH: 5 (ref 5.0–8.0)

## 2022-06-14 LAB — CBC
HCT: 37.9 % — ABNORMAL LOW (ref 39.0–52.0)
Hemoglobin: 12.3 g/dL — ABNORMAL LOW (ref 13.0–17.0)
MCH: 31.4 pg (ref 26.0–34.0)
MCHC: 32.5 g/dL (ref 30.0–36.0)
MCV: 96.7 fL (ref 80.0–100.0)
Platelets: 461 10*3/uL — ABNORMAL HIGH (ref 150–400)
RBC: 3.92 MIL/uL — ABNORMAL LOW (ref 4.22–5.81)
RDW: 14 % (ref 11.5–15.5)
WBC: 15.6 10*3/uL — ABNORMAL HIGH (ref 4.0–10.5)
nRBC: 0 % (ref 0.0–0.2)

## 2022-06-14 LAB — COMPREHENSIVE METABOLIC PANEL
ALT: 11 U/L (ref 0–44)
AST: 13 U/L — ABNORMAL LOW (ref 15–41)
Albumin: 3.5 g/dL (ref 3.5–5.0)
Alkaline Phosphatase: 63 U/L (ref 38–126)
Anion gap: 17 — ABNORMAL HIGH (ref 5–15)
BUN: 105 mg/dL — ABNORMAL HIGH (ref 8–23)
CO2: 15 mmol/L — ABNORMAL LOW (ref 22–32)
Calcium: 9 mg/dL (ref 8.9–10.3)
Chloride: 107 mmol/L (ref 98–111)
Creatinine, Ser: 7.04 mg/dL — ABNORMAL HIGH (ref 0.61–1.24)
GFR, Estimated: 7 mL/min — ABNORMAL LOW (ref >60)
Glucose, Bld: 104 mg/dL — ABNORMAL HIGH (ref 70–99)
Potassium: 5.6 mmol/L — ABNORMAL HIGH (ref 3.5–5.1)
Sodium: 139 mmol/L (ref 135–145)
Total Bilirubin: 0.9 mg/dL (ref 0.3–1.2)
Total Protein: 7.3 g/dL (ref 6.5–8.1)

## 2022-06-14 LAB — BASIC METABOLIC PANEL
Anion gap: 17 — ABNORMAL HIGH (ref 5–15)
BUN: 105 mg/dL — ABNORMAL HIGH (ref 8–23)
CO2: 13 mmol/L — ABNORMAL LOW (ref 22–32)
Calcium: 8.6 mg/dL — ABNORMAL LOW (ref 8.9–10.3)
Chloride: 108 mmol/L (ref 98–111)
Creatinine, Ser: 7.06 mg/dL — ABNORMAL HIGH (ref 0.61–1.24)
GFR, Estimated: 7 mL/min — ABNORMAL LOW (ref >60)
Glucose, Bld: 106 mg/dL — ABNORMAL HIGH (ref 70–99)
Potassium: 5.7 mmol/L — ABNORMAL HIGH (ref 3.5–5.1)
Sodium: 138 mmol/L (ref 135–145)

## 2022-06-14 LAB — CULTURE, BLOOD (ROUTINE X 2)

## 2022-06-14 LAB — LACTIC ACID, PLASMA: Lactic Acid, Venous: 1 mmol/L (ref 0.5–1.9)

## 2022-06-14 LAB — LIPASE, BLOOD: Lipase: 30 U/L (ref 11–51)

## 2022-06-14 MED ORDER — ONDANSETRON HCL 4 MG/2ML IJ SOLN
4.0000 mg | Freq: Four times a day (QID) | INTRAMUSCULAR | Status: DC | PRN
  Administered 2022-06-20: 4 mg via INTRAVENOUS
  Filled 2022-06-14: qty 2

## 2022-06-14 MED ORDER — IPRATROPIUM-ALBUTEROL 0.5-2.5 (3) MG/3ML IN SOLN
3.0000 mL | Freq: Four times a day (QID) | RESPIRATORY_TRACT | Status: DC
  Administered 2022-06-14 – 2022-06-16 (×6): 3 mL via RESPIRATORY_TRACT
  Filled 2022-06-14 (×6): qty 3

## 2022-06-14 MED ORDER — SODIUM ZIRCONIUM CYCLOSILICATE 10 G PO PACK
10.0000 g | PACK | Freq: Once | ORAL | Status: AC
  Administered 2022-06-14: 10 g via ORAL
  Filled 2022-06-14: qty 1

## 2022-06-14 MED ORDER — METOPROLOL TARTRATE 25 MG PO TABS
25.0000 mg | ORAL_TABLET | Freq: Two times a day (BID) | ORAL | Status: DC
  Administered 2022-06-14 – 2022-06-17 (×6): 25 mg via ORAL
  Filled 2022-06-14 (×6): qty 1

## 2022-06-14 MED ORDER — ALBUTEROL SULFATE (2.5 MG/3ML) 0.083% IN NEBU
INHALATION_SOLUTION | RESPIRATORY_TRACT | Status: AC
  Administered 2022-06-14: 2.5 mg
  Filled 2022-06-14: qty 3

## 2022-06-14 MED ORDER — METRONIDAZOLE 500 MG/100ML IV SOLN: 500.0000 mg | Freq: Two times a day (BID) | INTRAVENOUS | Status: DC

## 2022-06-14 MED ORDER — ACETAMINOPHEN 650 MG RE SUPP: 650.0000 mg | Freq: Four times a day (QID) | RECTAL | Status: DC | PRN

## 2022-06-14 MED ORDER — HEPARIN SODIUM (PORCINE) 5000 UNIT/ML IJ SOLN
5000.0000 [IU] | Freq: Two times a day (BID) | INTRAMUSCULAR | Status: DC
  Administered 2022-06-14 – 2022-06-20 (×11): 5000 [IU] via SUBCUTANEOUS
  Filled 2022-06-14 (×11): qty 1

## 2022-06-14 MED ORDER — SODIUM CHLORIDE 0.9 % IV SOLN: 1.0000 g | INTRAVENOUS | Status: DC

## 2022-06-14 MED ORDER — SODIUM BICARBONATE 8.4 % IV SOLN
INTRAVENOUS | Status: AC
  Filled 2022-06-14: qty 150

## 2022-06-14 MED ORDER — ONDANSETRON HCL 4 MG PO TABS: 4.0000 mg | ORAL_TABLET | Freq: Four times a day (QID) | ORAL | Status: DC | PRN

## 2022-06-14 MED ORDER — LACTATED RINGERS IV BOLUS
1000.0000 mL | Freq: Once | INTRAVENOUS | Status: AC
  Administered 2022-06-14: 1000 mL via INTRAVENOUS

## 2022-06-14 MED ORDER — SODIUM CHLORIDE 0.9 % IV SOLN
1.0000 g | Freq: Once | INTRAVENOUS | Status: AC
  Administered 2022-06-14: 1 g via INTRAVENOUS
  Filled 2022-06-14: qty 10

## 2022-06-14 MED ORDER — METRONIDAZOLE 500 MG/100ML IV SOLN
500.0000 mg | Freq: Two times a day (BID) | INTRAVENOUS | Status: DC
  Administered 2022-06-14 – 2022-06-20 (×12): 500 mg via INTRAVENOUS
  Filled 2022-06-14 (×12): qty 100

## 2022-06-14 MED ORDER — SODIUM BICARBONATE 8.4 % IV SOLN
INTRAVENOUS | Status: DC
  Filled 2022-06-14 (×5): qty 1000

## 2022-06-14 MED ORDER — ACETAMINOPHEN 325 MG PO TABS: 650.0000 mg | ORAL_TABLET | Freq: Four times a day (QID) | ORAL | Status: DC | PRN

## 2022-06-14 NOTE — ED Provider Notes (Signed)
[place] EMERGENCY DEPARTMENT AT [facility] Provider Note   CSN: [id number] Arrival date & time: 07/09/2022  1406     History  Chief Complaint  Patient presents with   Abdominal Pain    [name] is a 87 y.o. male.  Pt with c/o generalized abd pain/distension in past week. Also c/o urinary incontinence, and feeling as if not emptying bladder. Nausea, no vomiting. Is having a couple loose stools per day, but indicates not unusual for him. No fever or chills. No dysuria or hematuria. No back/flank pain. Denies chest pain or discomfort. No cough or uri symptoms. Pt also noted to have LLE swelling and redness - indicates the redness of skin is new/unusual for him. Denies hx dvt.   The history is provided by the patient, medical records and the EMS personnel.  Abdominal Pain Associated symptoms: diarrhea and nausea   Associated symptoms: no chest pain, no chills, no cough, no dysuria, no fever, no shortness of breath and no sore throat        Home Medications Prior to Admission medications   Medication Sig Start Date End Date Taking? Authorizing Provider  acetaminophen (TYLENOL) 500 MG tablet Take 1,000 mg by mouth every 6 (six) hours as needed.    [provider]  albuterol (VENTOLIN HFA) 108 (90 Base) MCG/ACT inhaler Inhale 2 puffs into the lungs every 6 (six) hours as needed for wheezing or shortness of breath. 08/29/21   Cobb, Ruby Cola, NP  amLODipine-benazepril (LOTREL) 10-40 MG capsule Take 1 capsule by mouth daily.    [provider]  aspirin 81 MG chewable tablet Chew 81 mg by mouth every evening.    [provider]  atorvastatin (LIPITOR) 20 MG tablet Take 1 tablet (20 mg total) by mouth every evening. 11/22/18   Glade Lloyd, MD  Cholecalciferol (VITAMIN D3) 50 MCG (2000 UT) TABS Take 2,000 Units by mouth 2 (two) times daily.     [provider]  Cinnamon 500 MG TABS Take 500 mg by mouth 2 (two) times daily.    [provider]  Cyanocobalamin (VITAMIN B12 PO) Use as directed 1 tablet in the mouth or throat daily.    [provider]  Emollient (CERAVE) CREA Apply 1 application topically 2 (two) times daily.    [provider]  ferrous sulfate 325 (65 FE) MG tablet Take 325 mg by mouth daily with breakfast.    [provider]  Fluticasone-Umeclidin-Vilant (TRELEGY ELLIPTA) 200-62.5-25 MCG/ACT AEPB Inhale 1 puff into the lungs daily. 08/29/21   Cobb, Ruby Cola, NP  hydrochlorothiazide (HYDRODIURIL) 25 MG tablet Take 25 mg by mouth daily.    [provider]  Magnesium 250 MG TABS Take 250 mg by mouth daily.     [provider]  metoprolol tartrate (LOPRESSOR) 100 MG tablet Take 1 tablet (100 mg total) by mouth at bedtime. 11/22/18   Glade Lloyd, MD  Multiple Vitamins-Minerals (MENS MULTIPLE VITAMIN/LYCOPENE PO) Take 1 tablet by mouth daily.    [provider]  nitroGLYCERIN (NITROSTAT) 0.4 MG SL tablet Place 1 tablet (0.4 mg total) under the tongue every 5 (five) minutes as needed. 12/27/12   Rollene Rotunda, MD  OVER THE COUNTER MEDICATION Take 2 tablets by mouth daily. Fish, flax and borage oil    [provider]  Turmeric (QC TUMERIC COMPLEX PO) Take by mouth 2 (two) times daily.    [provider]  zinc gluconate 50 MG tablet Take 50 mg  by mouth daily.    [provider]      Allergies    Other and Sulfa antibiotics    Review of Systems   Review of Systems  Constitutional:  Negative for chills and fever.  HENT:  Negative for sore throat.   Eyes:  Negative for redness.  Respiratory:  Negative for cough and shortness of breath.   Cardiovascular:  Negative for chest pain.  Gastrointestinal:  Positive for abdominal pain, diarrhea and nausea.  Genitourinary:  Negative for dysuria and flank pain.  Musculoskeletal:  Negative for back pain and neck pain.  Skin:  Negative for rash.  Neurological:  Negative for headaches.   Hematological:  Does not bruise/bleed easily.  Psychiatric/Behavioral:  Negative for confusion.     Physical Exam Updated Vital Signs BP 126/64 (BP Location: Left Arm)   Pulse 88   Temp 98.1 F (36.7 C) (Oral)   Resp 18   Ht 1.88 m (6\' 2" )   Wt 113.4 kg   SpO2 94%   BMI 32.10 kg/m  Physical Exam Vitals and nursing note reviewed.  Constitutional:      Appearance: Normal appearance. He is well-developed.  HENT:     Head: Atraumatic.     Nose: Nose normal.     Mouth/Throat:     Mouth: Mucous membranes are moist.     Pharynx: Oropharynx is clear.  Eyes:     General: No scleral icterus.    Conjunctiva/sclera: Conjunctivae normal.  Neck:     Trachea: No tracheal deviation.  Cardiovascular:     Rate and Rhythm: Normal rate and regular rhythm.     Pulses: Normal pulses.     Heart sounds: Normal heart sounds. No murmur heard.    No friction rub. No gallop.  Pulmonary:     Effort: Pulmonary effort is normal. No accessory muscle usage or respiratory distress.     Breath sounds: Normal breath sounds.  Abdominal:     General: Bowel sounds are normal. There is distension.     Palpations: Abdomen is soft.     Tenderness: There is abdominal tenderness. There is no guarding or rebound.     Comments: Mild tenderness.   Genitourinary:    Comments: No cva tenderness. Normal external gu exam. (Pt currently urinating, but only able to urinate small amount, ~ 30 cc) Musculoskeletal:     Cervical back: Normal range of motion and neck supple. No rigidity.     Comments: Left lower leg swelling > RLE.  Cellulitis of LLE. Distal pulses palp.   Skin:    General: Skin is warm and dry.     Findings: No rash.  Neurological:     Mental Status: He is alert.     Comments: Alert, speech clear.   Psychiatric:        Mood and Affect: Mood normal.     ED Results / Procedures / Treatments   Labs (all labs ordered are listed, but only abnormal results are displayed) Results for orders placed  or performed during the hospital encounter of 06/30/2022  Urinalysis, Routine w reflex microscopic -Urine, Clean Catch  Result Value Ref Range   Color, Urine YELLOW YELLOW   APPearance CLOUDY (A) CLEAR   Specific Gravity, Urine 1.011 1.005 - 1.030   pH 5.0 5.0 - 8.0   Glucose, UA NEGATIVE NEGATIVE mg/dL   Hgb urine dipstick LARGE (A) NEGATIVE   Bilirubin Urine NEGATIVE NEGATIVE   Ketones, ur 5 (A) NEGATIVE mg/dL  Protein, ur 100 (A) NEGATIVE mg/dL   Nitrite NEGATIVE NEGATIVE   Leukocytes,Ua MODERATE (A) NEGATIVE   RBC / HPF >50 0 - 5 RBC/hpf   WBC, UA 21-50 0 - 5 WBC/hpf   Bacteria, UA FEW (A) NONE SEEN   Squamous Epithelial / HPF 0-5 0 - 5 /HPF   WBC Clumps PRESENT    Hyaline Casts, UA PRESENT    Amorphous Crystal PRESENT    CT Chest Wo Contrast  Result Date: 06/09/2022 CLINICAL DATA:  History of colon cancer. Status post chemotherapy and radiation. Lung nodules. EXAM: CT CHEST WITHOUT CONTRAST TECHNIQUE: Multidetector CT imaging of the chest was performed following the standard protocol without IV contrast. RADIATION DOSE REDUCTION: This exam was performed according to the departmental dose-optimization program which includes automated exposure control, adjustment of the mA and/or kV according to patient size and/or use of iterative reconstruction technique. COMPARISON:  CT 09/26/2021 and older FINDINGS: Cardiovascular: Heart is nonenlarged. No pericardial effusion. Coronary artery calcifications are seen. The thoracic aorta has some scattered vascular calcifications. Tortuous course of the descending thoracic aorta. Mediastinum/Nodes: Small hiatal hernia. Normal caliber thoracic esophagus. The esophagus is slightly patulous. Heterogeneous thyroid gland. No specific abnormal lymph node enlargement identified in the axillary region or hilum on this noncontrast examination. There are some small mediastinal nodes, nonpathologic by size criteria. This includes retrocrural. Example series 2,  image 132 right retrocrural measures 12 x 9 mm today and previously 12 x 8 mm. Lungs/Pleura: Breathing motion. Dependent atelectasis. No consolidation, pneumothorax or effusion. Upper lung zone centrilobular emphysematous lung changes are identified. Apical pleural thickening on the left-greater-than-right. Spiculated semi-solid nodule seen in the right upper lobe on the prior that measured 16 mm overall, today on series 2, image 62 again measures 16 by 10 mm, unchanged when measured in the same fashion. The solid component more superior the was measured at 9 mm is stable. On the study of December 2021, the lesion is remeasured in the same fashion as today and at that time would have measured 15 x 9 mm. Very similar. Left upper lobe 5 mm nodule at the apex is stable today on series 2, image 32. This lesion has been stable since December 2021. Upper Abdomen: In the upper abdomen the adrenal glands are diffusely thickened and nodular. New collecting system dilatation of the kidneys. Etiology is uncertain. Musculoskeletal: Diffuse degenerative changes seen along the spine. IMPRESSION: Bilateral lung nodule seen previously are similar today going back to a study of December 2021. Long-term stability. No specific additional imaging follow-up of the lung nodules. Underlying emphysematous lung changes. However there is new collecting system dilatation of the kidneys at the edge of the imaging field of uncertain etiology. Recommend dedicated abdominal imaging to confirm etiology and correlate with known history. Findings will be called to the ordering service by the Radiology physician assistant team Aortic Atherosclerosis (ICD10-I70.0) and Emphysema (ICD10-J43.9). Electronically Signed   By: Karen Kays M.D.   On: 06/09/2022 15:15     EKG None  Radiology No results found.  Procedures Procedures    Medications Ordered in ED Medications  cefTRIAXone (ROCEPHIN) 1 g in sodium chloride 0.9 % 100 mL IVPB (has no  administration in time range)  lactated ringers bolus 1,000 mL (1,000 mLs Intravenous New Bag/Given 06/13/2022 1519)    ED Course/ Medical Decision Making/ A&P  Medical Decision Making Problems Addressed: Abdominal pain, generalized: acute illness or injury with systemic symptoms that poses a threat to life or bodily functions Cellulitis of left leg: acute illness or injury with systemic symptoms that poses a threat to life or bodily functions Nausea: acute illness or injury Urinary symptom or sign: acute illness or injury  Amount and/or Complexity of Data Reviewed Independent Historian: EMS    Details: hx External Data Reviewed: notes. Labs: ordered. Decision-making details documented in ED Course. Radiology: ordered and independent interpretation performed. Decision-making details documented in ED Course.  Risk Prescription drug management. Decision regarding hospitalization.  Iv ns. Continuous pulse ox and cardiac monitoring. Labs ordered/sent. Imaging ordered.   Differential diagnosis includes bladder outlet obstruction, sbo, dehydration, uti, cellulitis, etc. Dispo decision including potential need for admission considered - will get labs and imaging and reassess.   Reviewed nursing notes and prior charts for additional history. External reports reviewed. Additional history from: EMS.   Cardiac monitor: sinus rhythm, rate 88.  Labs reviewed/interpreted by me - UA with many wbc and some bacteria, ?uti, will culture and treat. Rocephin iv. Ivf bolus. Additional labs pending.   CT reviewed/interpreted by me - pnd.   Bladder scan/foley/check of post void residual.   1521, blood work/additional labs pending.  Signed out to Dr Jearld Fenton.  Pt will need foley/post void residual check, check of labs and ct abd/pelvis. Likely will need admission for abd/gu issues as well as cellulitis LLE.            Final Clinical Impression(s) / ED Diagnoses Final  diagnoses:  None    Rx / DC Orders ED Discharge Orders     None         Cathren Laine, MD 07/03/2022 1524

## 2022-06-14 NOTE — H&P (Addendum)
TRH H&P    Patient Demographics:    [name], is a 87 y.o. male  MRN: [id number]  DOB - [birth year]  Admit Date - 06/16/2022  Referring MD/NP/PA: Dr Jearld Fenton  Outpatient Primary MD for the patient is Verlee Monte, NP  Patient coming from: Home  Chief complaint-urinary incontinence   HPI:    [name]  is a 87 y.o. male, with history of CAD s/p stent placement, hypertension came to hospital with symptoms of incomplete bladder emptying, urinary incontinence, nausea, no vomiting, loose stools for past 3 to 4 days.  Patient says that he was referred to nephrologist by his PCP few days ago and saw the nephrologist, who had recommended that he should get renal ultrasound which was scheduled for May 14.  Patient says that for past few days he noticed that he was having incontinence of urine and also having loose stools, having difficulty controlling his bowel movements.  Patient felt very weak for past few days, also developed nausea but no vomiting. He also noticed worsening left lower extremity swelling and redness. Denies fever or chills. Denies blood in the urine Denies chest pain or shortness of breath He does appear chronically oxygen at home 2 L/min especially at night and has noticed worsening dyspnea on exertion.  He has history of COPD  In the ED CT abdomen/pelvis showed marked wall thickening of the posterior aspect of the bladder measuring up to 14 mm which results in severe bilateral hydroureteronephrosis.  Differential include neoplasm postradiation change or cystitis.  Also showed segmental wall thickening of distal colon extending from the clinic flexure to rectosigmoid anastomosis suspicious for inflammatory or infectious colitis.  New retroperitoneal and bilateral inguinal lymphadenopathy, metastatic disease not excluded.  Fat-containing ventral hernias, no bowel herniation.  Lab work in the ED showed  acute kidney injury with creatinine 7.04, BUN 105, potassium 5.6, CO2 15. Urology and nephrology were consulted.  20 French Foley catheter inserted in the ED.  Urology does not recommend continuous bladder irrigation at this time.    Review of systems:    In addition to the HPI above,    All other systems reviewed and are negative.    Past History of the following :    Past Medical History:  Diagnosis Date   Arthritis    knees, hips   CAD (coronary artery disease)    Abnormal stress perfusion study with apical and inferior wall ischemia. LAD had a long 99% stenosis followed by 30% stenosis, first diagonal 25% stenosis, circumflex luminal irregularities, second obtuse marginal 25% stenosis, RCA 40% followed by 40% stenosis. EF 60%. Stenting with 2 bare metal stents by Dr. Excell Seltzer to the LAD.   Colon cancer [facility])    Radiation, chemo, surgery 2011   Complication of anesthesia    Pt states he coughs alot during anesthesia   COPD (chronic obstructive pulmonary disease) (HCC)    Dyspnea    when walking, knee pain    Dysrhythmia    History of kidney stones    passed x1  History of tobacco use    HOH (hard of hearing)    Hypertension    Pneumonia    many times as a child      Past Surgical History:  Procedure Laterality Date   ABDOMINAL AORTOGRAM W/LOWER EXTREMITY N/A 01/09/2019   Procedure: ABDOMINAL AORTOGRAM W/LOWER EXTREMITY;  Surgeon: Maeola Harman, MD;  Location: [facility] INVASIVE CV LAB;  Service: Cardiovascular;  Laterality: N/A;   BRONCHIAL BIOPSY  08/15/2019   Procedure: BRONCHIAL BIOPSIES;  Surgeon: Josephine Igo, DO;  Location: [facility] ENDOSCOPY;  Service: Pulmonary;;   BRONCHIAL BRUSHINGS  08/15/2019   Procedure: BRONCHIAL BRUSHINGS;  Surgeon: Josephine Igo, DO;  Location: [facility] ENDOSCOPY;  Service: Pulmonary;;   BRONCHIAL NEEDLE ASPIRATION BIOPSY  08/15/2019   Procedure: BRONCHIAL NEEDLE ASPIRATION BIOPSIES;  Surgeon: Josephine Igo, DO;  Location: [facility] ENDOSCOPY;   Service: Pulmonary;;   BRONCHIAL WASHINGS  08/15/2019   Procedure: BRONCHIAL WASHINGS;  Surgeon: Josephine Igo, DO;  Location: [facility] ENDOSCOPY;  Service: Pulmonary;;   CATARACT EXTRACTION W/PHACO Left 05/29/2016   Procedure: CATARACT EXTRACTION PHACO AND INTRAOCULAR LENS PLACEMENT (IOC);  Surgeon: Fabio Pierce, MD;  Location: [facility] ORS;  Service: Ophthalmology;  Laterality: Left;  CDE: 10.39   CATARACT EXTRACTION W/PHACO Right 06/12/2016   Procedure: CATARACT EXTRACTION PHACO AND INTRAOCULAR LENS PLACEMENT RIGHT EYE CDE= 13.59;  Surgeon: Fabio Pierce, MD;  Location: [facility] OR[facility];  Service: Ophthalmology;  Laterality: Right;  right   CORONARY ANGIOPLASTY WITH STENT PLACEMENT  2008   x 2   HEMICOLECTOMY  01/10/2010   INGUINAL HERNIA REPAIR  2012   PORTACATH PLACEMENT  2011   portacath removal  2011   UMBILICAL HERNIA REPAIR  01/2008   left   VIDEO BRONCHOSCOPY WITH ENDOBRONCHIAL NAVIGATION N/A 08/15/2019   Procedure: VIDEO BRONCHOSCOPY WITH ENDOBRONCHIAL NAVIGATION;  Surgeon: Josephine Igo, DO;  Location: [facility];  Service: Pulmonary;  Laterality: N/A;      Social History:   Denies tobacco abuse, he smoked cigarettes for 60 years.  Quit 15 years ago.  Denies drinking alcohol.  No history of drug use   Social History   Tobacco Use   Smoking status: Former    Packs/day: 3.00    Years: 57.00    Additional pack years: 0.00    Total pack years: 171.00    Types: Cigarettes    Quit date: 2006    Years since quitting: 18.3    Passive exposure: Past   Smokeless tobacco: Never   Tobacco comments:    quit smoking 2006  Substance Use Topics   Alcohol use: No       Family History :     Family History  Adopted: Yes   Patient was adopted, unknown family history   Home Medications:   Prior to Admission medications   Medication Sig Start Date End Date Taking? Authorizing Provider  acetaminophen (TYLENOL) 500 MG tablet Take 1,000 mg by mouth every 6 (six) hours as needed.   Yes  [provider]  albuterol (VENTOLIN HFA) 108 (90 Base) MCG/ACT inhaler Inhale 2 puffs into the lungs every 6 (six) hours as needed for wheezing or shortness of breath. Patient taking differently: Inhale 1 puff into the lungs every 6 (six) hours as needed for wheezing or shortness of breath. 08/29/21  Yes Cobb, Ruby Cola, NP  amLODipine-benazepril (LOTREL) 10-40 MG capsule Take 1 capsule by mouth daily.   Yes [provider]  aspirin 81 MG chewable tablet Chew 81 mg by mouth  every evening.   Yes [provider]  atorvastatin (LIPITOR) 20 MG tablet Take 1 tablet (20 mg total) by mouth every evening. 11/22/18  Yes Glade Lloyd, MD  Cholecalciferol (VITAMIN D3) 50 MCG (2000 UT) TABS Take 2,000 Units by mouth daily.   Yes [provider]  Cinnamon 500 MG TABS Take 500 mg by mouth 2 (two) times daily.   Yes [provider]  Cyanocobalamin (VITAMIN B12 PO) Use as directed 1 tablet in the mouth or throat daily.   Yes [provider]  Emollient (CERAVE) CREA Apply 1 application topically 2 (two) times daily.   Yes [provider]  ferrous sulfate 325 (65 FE) MG tablet Take 325 mg by mouth daily with breakfast.   Yes [provider]  Fluticasone-Umeclidin-Vilant (TRELEGY ELLIPTA) 200-62.5-25 MCG/ACT AEPB Inhale 1 puff into the lungs daily. 08/29/21  Yes Cobb, Ruby Cola, NP  hydrochlorothiazide (HYDRODIURIL) 25 MG tablet Take 25 mg by mouth daily.   Yes [provider]  loperamide (IMODIUM A-D) 2 MG tablet Take 2 mg by mouth 4 (four) times daily as needed for diarrhea or loose stools.   Yes [provider]  Magnesium 250 MG TABS Take 250 mg by mouth daily.    Yes [provider]  metoprolol tartrate (LOPRESSOR) 100 MG tablet Take 1 tablet (100 mg total) by mouth at bedtime. 11/22/18  Yes Glade Lloyd, MD  Multiple Vitamins-Minerals (MENS MULTIPLE VITAMIN/LYCOPENE PO) Take 1 tablet by mouth daily.   Yes  [provider]  nitroGLYCERIN (NITROSTAT) 0.4 MG SL tablet Place 1 tablet (0.4 mg total) under the tongue every 5 (five) minutes as needed. 12/27/12  Yes Rollene Rotunda, MD  OVER THE COUNTER MEDICATION Take 1 tablet by mouth 2 (two) times daily. Fish, flax and borage oil   Yes [provider]  Turmeric (QC TUMERIC COMPLEX PO) Take by mouth 2 (two) times daily.   Yes [provider]  zinc gluconate 50 MG tablet Take 50 mg by mouth daily.   Yes [provider]     Allergies:     Allergies  Allergen Reactions   Other     General anesthesia - cough    Sulfa Antibiotics     Unknown reaction - we had listed nausea      Physical Exam:   Vitals  Blood pressure (!) 151/65, pulse 87, temperature 98.1 F (36.7 C), temperature source Oral, resp. rate 17, height 6\' 2"  (1.88 m), weight 113.4 kg, SpO2 95 %.  1.  General: Appears in no acute distress  2. Psychiatric: Alert, oriented x 3, intact insight and judgment  3. Neurologic: Cranial nerves II through XII grossly intact, no focal deficit noted  4. HEENMT:  Atraumatic normocephalic, extraocular muscles intact  5. Respiratory : Lungs clear to auscultation bilaterally  6. Cardiovascular : S1-S2, regular, no murmur auscultated  7. Gastrointestinal:  Abdomen is soft, distended,  8. Skin:  Skin around umbilicus is mildly erythematous  9.Musculoskeletal:  3+ pitting edema of the left lower extremity, erythema, tenderness to palpation    Data Review:    CBC Recent Labs  Lab 07/08/2022 1459  WBC 15.6*  HGB 12.3*  HCT 37.9*  PLT 461*  MCV 96.7  MCH 31.4  MCHC 32.5  RDW 14.0   ------------------------------------------------------------------------------------------------------------------  Results for orders placed or performed during the hospital encounter of 06/13/2022 (from the past 48 hour(s))  Urinalysis, Routine w reflex microscopic -Urine, Clean Catch     Status: Abnormal  Collection Time: 07/06/2022  2:55 PM  Result Value Ref Range   Color, Urine YELLOW YELLOW   APPearance CLOUDY (A) CLEAR   Specific Gravity, Urine 1.011 1.005 - 1.030   pH 5.0 5.0 - 8.0   Glucose, UA NEGATIVE NEGATIVE mg/dL   Hgb urine dipstick LARGE (A) NEGATIVE   Bilirubin Urine NEGATIVE NEGATIVE   Ketones, ur 5 (A) NEGATIVE mg/dL   Protein, ur 161 (A) NEGATIVE mg/dL   Nitrite NEGATIVE NEGATIVE   Leukocytes,Ua MODERATE (A) NEGATIVE   RBC / HPF >50 0 - 5 RBC/hpf   WBC, UA 21-50 0 - 5 WBC/hpf   Bacteria, UA FEW (A) NONE SEEN   Squamous Epithelial / HPF 0-5 0 - 5 /HPF   WBC Clumps PRESENT    Hyaline Casts, UA PRESENT    Amorphous Crystal PRESENT     Comment: Performed at [facility], [address], [place], [place] [address]  CBC     Status: Abnormal   Collection Time: 07/05/2022  2:59 PM  Result Value Ref Range   WBC 15.6 (H) 4.0 - 10.5 K/uL   RBC 3.92 (L) 4.22 - 5.81 MIL/uL   Hemoglobin 12.3 (L) 13.0 - 17.0 g/dL   HCT 54.0 (L) 98.1 - 19.1 %   MCV 96.7 80.0 - 100.0 fL   MCH 31.4 26.0 - 34.0 pg   MCHC 32.5 30.0 - 36.0 g/dL   RDW 47.8 29.5 - 62.1 %   Platelets 461 (H) 150 - 400 K/uL   nRBC 0.0 0.0 - 0.2 %    Comment: Performed at [facility], [address], [place], [place] [address]  Comprehensive metabolic panel     Status: Abnormal   Collection Time: 06/29/2022  2:59 PM  Result Value Ref Range   Sodium 139 135 - 145 mmol/L   Potassium 5.6 (H) 3.5 - 5.1 mmol/L   Chloride 107 98 - 111 mmol/L   CO2 15 (L) 22 - 32 mmol/L   Glucose, Bld 104 (H) 70 - 99 mg/dL    Comment: Glucose reference range applies only to samples taken after fasting for at least 8 hours.   BUN 105 (H) 8 - 23 mg/dL    Comment: CRITICAL RESULT CALLED TO, READ BACK BY AND VERIFIED WITH KING, C AT 1607 ON 07/01/2022 BY SMN. RESULT CONFIRMED BY MANUAL DILUTION    Creatinine, Ser 7.04 (H) 0.61 - 1.24 mg/dL   Calcium 9.0 8.9 - 78.4 mg/dL   Total Protein 7.3 6.5 - 8.1 g/dL   Albumin 3.5 3.5 - 5.0 g/dL   AST 13  (L) 15 - 41 U/L   ALT 11 0 - 44 U/L   Alkaline Phosphatase 63 38 - 126 U/L   Total Bilirubin 0.9 0.3 - 1.2 mg/dL   GFR, Estimated 7 (L) >60 mL/min    Comment: (NOTE) Calculated using the CKD-EPI Creatinine Equation (2021)    Anion gap 17 (H) 5 - 15    Comment: Performed at [facility], [address] [place], [place] [address]  Lipase, blood     Status: None   Collection Time: 06/13/2022  2:59 PM  Result Value Ref Range   Lipase 30 11 - 51 U/L    Comment: Performed at [facility], [address] [place], [place] [address]  Lactic acid, plasma     Status: None   Collection Time: 07/06/2022  2:59 PM  Result Value Ref Range   Lactic Acid, Venous 1.0 0.5 - 1.9 mmol/L  Comment: Performed at [facility], [address] [place], [place] [address]  Blood culture (routine x 2)     Status: None (Preliminary result)   Collection Time: 06/24/2022  2:59 PM   Specimen: Right Antecubital; Blood  Result Value Ref Range   Specimen Description      RIGHT ANTECUBITAL BOTTLES DRAWN AEROBIC AND ANAEROBIC   Special Requests      Blood Culture results may not be optimal due to an excessive volume of blood received in culture bottles Performed at [facility], [address], [place], [place] [address]    Culture PENDING    Report Status PENDING   Blood culture (routine x 2)     Status: None (Preliminary result)   Collection Time: 06/18/2022  3:12 PM   Specimen: Left Antecubital; Blood  Result Value Ref Range   Specimen Description      LEFT ANTECUBITAL BOTTLES DRAWN AEROBIC AND ANAEROBIC   Special Requests      Blood Culture results may not be optimal due to an excessive volume of blood received in culture bottles Performed at [facility], [address] [place], [place] [address]    Culture PENDING    Report Status PENDING     Chemistries  Recent Labs  Lab 06/13/2022 1459  NA 139  K 5.6*  CL 107  CO2 15*  GLUCOSE 104*  BUN 105*  CREATININE 7.04*  CALCIUM 9.0  AST 13*  ALT 11  ALKPHOS  63  BILITOT 0.9   ------------------------------------------------------------------------------------------------------------------  ------------------------------------------------------------------------------------------------------------------ GFR: Estimated Creatinine Clearance: 10.5 mL/min (A) (by C-G formula based on SCr of 7.04 mg/dL (H)). Liver Function Tests: Recent Labs  Lab 06/10/2022 1459  AST 13*  ALT 11  ALKPHOS 63  BILITOT 0.9  PROT 7.3  ALBUMIN 3.5   Recent Labs  Lab 06/16/2022 1459  LIPASE 30   No results for input(s): "AMMONIA" in the last 168 hours. Coagulation Profile: No results for input(s): "INR", "PROTIME" in the last 168 hours. Cardiac Enzymes: No results for input(s): "CKTOTAL", "CKMB", "CKMBINDEX", "TROPONINI" in the last 168 hours. BNP (last 3 results) No results for input(s): "PROBNP" in the last 8760 hours. HbA1C: No results for input(s): "HGBA1C" in the last 72 hours. CBG: No results for input(s): "GLUCAP" in the last 168 hours. Lipid Profile: No results for input(s): "CHOL", "HDL", "LDLCALC", "TRIG", "CHOLHDL", "LDLDIRECT" in the last 72 hours. Thyroid Function Tests: No results for input(s): "TSH", "T4TOTAL", "FREET4", "T3FREE", "THYROIDAB" in the last 72 hours. Anemia Panel: No results for input(s): "VITAMINB12", "FOLATE", "FERRITIN", "TIBC", "IRON", "RETICCTPCT" in the last 72 hours.  --------------------------------------------------------------------------------------------------------------- Urine analysis:    Component Value Date/Time   COLORURINE YELLOW 06/20/2022 1455   APPEARANCEUR CLOUDY (A) 06/19/2022 1455   LABSPEC 1.011 06/12/2022 1455   PHURINE 5.0 06/29/2022 1455   GLUCOSEU NEGATIVE 07/09/2022 1455   HGBUR LARGE (A) 06/30/2022 1455   BILIRUBINUR NEGATIVE 06/25/2022 1455   KETONESUR 5 (A) 06/24/2022 1455   PROTEINUR 100 (A) 06/24/2022 1455   NITRITE NEGATIVE 06/27/2022 1455   LEUKOCYTESUR MODERATE (A) 07/07/2022 1455       Imaging Results:    CT ABDOMEN PELVIS WO CONTRAST  Result Date: 06/15/2022 CLINICAL DATA:  Abdominal pain, nausea and diarrhea, history of colon cancer EXAM: CT ABDOMEN AND PELVIS WITHOUT CONTRAST TECHNIQUE: Multidetector CT imaging of the abdomen and pelvis was performed following the standard protocol without IV contrast. Unenhanced CT was performed per clinician order. Lack of IV contrast limits sensitivity and specificity, especially for  evaluation of abdominal/pelvic solid viscera. RADIATION DOSE REDUCTION: This exam was performed according to the departmental dose-optimization program which includes automated exposure control, adjustment of the mA and/or kV according to patient size and/or use of iterative reconstruction technique. COMPARISON:  05/16/2022 FINDINGS: Lower chest: New consolidation within the posterior left lower lobe at the costophrenic angle, which could be atelectasis or acute airspace disease. No pleural effusion. Hepatobiliary: Unremarkable unenhanced appearance of the liver. The gallbladder is moderately distended, without evidence of cholelithiasis or cholecystitis. Pancreas: Unremarkable unenhanced appearance. Spleen: Unremarkable unenhanced appearance. Adrenals/Urinary Tract: Bilateral hydronephrosis is again identified, as seen on recent chest CT. There is also bilateral hydroureter to the level of the base of the bladder. There is marked posterior bladder wall thickening, measuring up to 14 mm. High attenuation material seen within the bladder surrounding the Foley catheter balloon consistent with blood products. Differential diagnosis include cystitis, chronic scarring from previous radiation therapy in a patient with a history of colon cancer, or bladder neoplasm. Urology consultation recommended. There are bilateral nonobstructing renal calculi, measuring 5 mm on the left and 3 mm on the right. Simple appearing right renal cysts do not require specific imaging follow-up.  The adrenals are unremarkable. Stomach/Bowel: Postsurgical changes are seen from distal colectomy and reanastomosis. There is segmental wall thickening of the descending colon extending to the level of the anastomosis, compatible with inflammatory or infectious colitis. Moderate distension of the colon with retained gas and stool. No evidence of small-bowel obstruction. Vascular/Lymphatic: Lymphadenopathy has developed within the bilateral inguinal regions and retroperitoneum. Index lymph node in the left inguinal region reference image 76/2 measures 15 mm in short axis. Left para-aortic adenopathy measures up to 14 mm in short axis reference image 43/2. There is extensive atherosclerosis throughout the aorta and its distal branches. Evaluation of the vascular lumen is limited without IV contrast. Reproductive: The prostate is enlarged, measuring 5.7 x 4.8 cm in cross-sectional diameter. Other: There is diffuse fat stranding in the perirectal region, surrounding the prostate, and adjacent to the wall thickening at the base of the bladder. This is nonspecific, and could be related to prior radiation therapy. There is no free intraperitoneal fluid or free intraperitoneal gas. There are fat containing midline ventral hernias. The periumbilical fat containing ventral hernia on image 53 demonstrates stranding within the herniated fat, could reflect incarceration. No bowel herniation. There is also evidence of prior ventral hernia repair. Musculoskeletal: No acute or destructive bony lesions. Stable severe right hip osteoarthritis. Reconstructed images demonstrate no additional findings. IMPRESSION: 1. Marked wall thickening of the posterior aspect of the bladder, measuring up to 14 mm. This results in severe bilateral hydroureteronephrosis. Differential diagnosis includes neoplasm, post radiation change, or cystitis. High attenuation material surrounding the Foley catheter within the bladder lumen consistent with blood  products. Urology consultation and cystoscopy recommended. 2. Segmental wall thickening of the distal colon, extending from the splenic flexure through the rectosigmoid anastomosis, suspicious for underlying inflammatory or infectious colitis. 3. New retroperitoneal and bilateral inguinal lymphadenopathy. Metastatic disease not excluded. 4. New dependent consolidation within the left lower lobe posterior costophrenic angle, consistent with pneumonia or atelectasis. 5. Fat containing midline ventral hernias. The inferior ventral hernia demonstrates stranding within the herniated fat, incarcerated hernia not excluded. No bowel herniation. 6. Enlarged prostate. 7. Nonspecific fat stranding within the lower pelvis surrounding the rectosigmoid anastomosis, prostate, and bladder base. This could be sequela of prior radiation therapy. 8.  Aortic Atherosclerosis (ICD10-I70.0). 9. Bilateral nonobstructing renal calculi. Electronically Signed  By: Sharlet Salina M.D.   On: 06/24/2022 17:06     Assessment & Plan:    Principal Problem:   AKI (acute kidney injury) (HCC)   Acute kidney injury-last known creatinine from 08/2019 was 0.80, he was told by his PCP 3 months ago that he has developed CKD.  No labs available at this time.  Today presented with creatinine of 7.04 with BUN 105.  CT abdomen/pelvis showed severe bilateral hydronephrosis with bladder wall thickening, suspicious for neoplasm.  Foley catheter has been inserted.  Urology and nephrology have been consulted.  Patient received 1 L of Ringer lactate in the ED.  Will repeat labs tonight.  Follow BMP in AM.  Hold HCTZ, benazepril.  Left lower extremity cellulitis-left lower extremity is erythematous and edematous.  Tender to palpation, he also has leukocytosis.  Will start ceftriaxone for cellulitis.  Bilateral hydroureteronephrosis-urology has been consulted, likely has overflow incontinence as per urology..  Foley catheter was inserted as above.  ?   UTI-he has abnormal UA, started on ceftriaxone as above for possible UTI.  Follow urine culture results.  Colitis/diarrhea-patient has diarrhea for past 3 to 4 days, CT scan shows infectious versus inflammatory colitis.  Started on ceftriaxone as above.  Will add Flagyl.  Obtain GI pathogen panel  Mild hyperkalemia-potassium was mildly elevated 5.6.  Will recheck BMP today.  Skin discoloration around  umbilicus-it appears more like dermatitis, CT abdomen/pelvis showed no bowel herniation in the ventral hernia.  Lactic acid 1.0.  Hypertension-blood pressure is stable, hold amlodipine, benazepril, hydrochlorothiazide, Lopressor  COPD-at baseline, currently on oxygen 2 L/min.  Start DuoNeb every 6 hours as needed  History of CAD, s/p stent placement-stable.  Will restart metoprolol at lower dose of 25 twice daily.  Patient takes metoprolol 100 mg daily at home.    DVT Prophylaxis-heparin  AM Labs Ordered, also please review Full Orders  Family Communication: Admission, patients condition and plan of care including tests being ordered have been discussed with the patient  who indicate understanding and agree with the plan and Code Status.  Code Status: Full code  Admission status: /Inpatient :The appropriate admission status for this patient is INPATIENT. Inpatient status is judged to be reasonable and necessary in order to provide the required intensity of service to ensure the patient's safety. The patient's presenting symptoms, physical exam findings, and initial radiographic and laboratory data in the context of their chronic comorbidities is felt to place them at high risk for further clinical deterioration. Furthermore, it is not anticipated that the patient will be medically stable for discharge from the hospital within 2 midnights of admission. The following factors support the admission status of inpatient.    * I certify that at the point of admission it is my clinical judgment that  the patient will require inpatient hospital care spanning beyond 2 midnights from the point of admission due to high intensity of service, high risk for further deterioration and high frequency of surveillance required.*  Time spent in minutes : 60 min   Ayan Yankey S Arianny Pun M.D

## 2022-06-14 NOTE — Progress Notes (Signed)
Repeat BMP shows BUN 106, creatinine 7.06.  Potassium 5.7.  Bicarb 13.  Called and discussed with nephrology, Dr. Fabio Asa.  Will start bicarb drip with D5W at 100 mL/h.  Will give 1 dose of Lokelma.  10 g x 1.  Nephrology to follow in AM.

## 2022-06-14 NOTE — ED Triage Notes (Signed)
Patient arrives via EMS, complains of abdominal pain and diarrhea and nausea. Patient is also over the last week incontinent with urine. Patient says his abdomen feels tight right now

## 2022-06-14 NOTE — ED Provider Notes (Signed)
3:11 PM Assumed care of patient from off-going team. For more details, please see note from same day.  In brief, this is a 87 y.o. male who p/w abd pain/distension and urinary retention. Had recent CT scan of chest that showed hydronephrosis.  Plan/Dispo at time of sign-out & ED Course since sign-out: [ ]  bladder scan, foley [ ]  CT abd pelvis w/ contrast when Cr comes back  BP 126/64 (BP Location: Left Arm)   Pulse 88   Temp 98.1 F (36.7 C) (Oral)   Resp 18   Ht 6\' 2"  (1.88 m)   Wt 113.4 kg   SpO2 94%   BMI 32.10 kg/m    ED Course:   Clinical Course as of 06/24/2022 1807  Sun Jun 14, 2022  1610 Creatinine(!): 7.04 Extreme AKI [HN]  1610 BUN(!): 105 [HN]  1610 CO2(!): 15 [HN]  1610 Potassium(!): 5.6 [HN]  1610 Anion gap(!): 17 [HN]  1610 WBC(!): 15.6 +Leukocytosis [HN]  1610 Hemoglobin(!): 12.3 [HN]  1610 Urinalysis, Routine w reflex microscopic -Urine, Clean Catch(!) +UTI, receiving IV ceftriaxone [HN]  1610 Lactic Acid, Venous: 1.0 [HN]  1610 Lipase: 30 [HN]  1612 Will get CT abd pelvis without contrast d/t large AKI [HN]  1703 Nephrology Dr. Arlean Hopping made aware of patient.  [HN]  1718 CT ABDOMEN PELVIS WO CONTRAST 1. Marked wall thickening of the posterior aspect of the bladder, measuring up to 14 mm. This results in severe bilateral hydroureteronephrosis. Differential diagnosis includes neoplasm, post radiation change, or cystitis. High attenuation material surrounding the Foley catheter within the bladder lumen consistent with blood products. Urology consultation and cystoscopy recommended. 2. Segmental wall thickening of the distal colon, extending from the splenic flexure through the rectosigmoid anastomosis, suspicious for underlying inflammatory or infectious colitis. 3. New retroperitoneal and bilateral inguinal lymphadenopathy. Metastatic disease not excluded. 4. New dependent consolidation within the left lower lobe posterior costophrenic angle, consistent  with pneumonia or atelectasis. 5. Fat containing midline ventral hernias. The inferior ventral hernia demonstrates stranding within the herniated fat, incarcerated hernia not excluded. No bowel herniation. 6. Enlarged prostate. 7. Nonspecific fat stranding within the lower pelvis surrounding the rectosigmoid anastomosis, prostate, and bladder base. This could be sequela of prior radiation therapy. 8.  Aortic Atherosclerosis (ICD10-I70.0). 9. Bilateral nonobstructing renal calculi.  [HN]  1721 Dr. Annabell Howells from urology messaged. [HN]  1737 D/w Dr. Annabell Howells, who states that with that degree of thickening, and his history of pain and incontinence, he probably was in retention with overflow.  Worthwhile to see how he does with the foley before considering  stents. He had some posterior thickening on a prior PET CT in 2021 [HN]  1737 He recommends 20Fr foley and further bladder irrigation with NS PRN. Consulted to hospitalist for admission. [HN]  1759 Pt with fat containing ventral hernias with erythema and TTP on his abdomen but it is just fat containing, nothing to do immediately.  [HN]    Clinical Course User Index [HN] Loetta Rough, MD    Dispo: Admit ------------------------------- Vivi Barrack, MD Emergency Medicine  This note was created using dictation software, which may contain spelling or grammatical errors.   Loetta Rough, MD 06/13/2022 [phone]

## 2022-06-15 ENCOUNTER — Inpatient Hospital Stay (HOSPITAL_COMMUNITY): Payer: Medicare Other

## 2022-06-15 ENCOUNTER — Other Ambulatory Visit (HOSPITAL_COMMUNITY): Payer: Medicare Other

## 2022-06-15 ENCOUNTER — Encounter (HOSPITAL_COMMUNITY): Payer: Self-pay | Admitting: Family Medicine

## 2022-06-15 ENCOUNTER — Other Ambulatory Visit (HOSPITAL_COMMUNITY): Payer: Self-pay | Admitting: *Deleted

## 2022-06-15 DIAGNOSIS — R1084 Generalized abdominal pain: Principal | ICD-10-CM | POA: Diagnosis present

## 2022-06-15 DIAGNOSIS — L03116 Cellulitis of left lower limb: Secondary | ICD-10-CM | POA: Diagnosis present

## 2022-06-15 DIAGNOSIS — N1339 Other hydronephrosis: Secondary | ICD-10-CM | POA: Diagnosis not present

## 2022-06-15 DIAGNOSIS — N3289 Other specified disorders of bladder: Secondary | ICD-10-CM

## 2022-06-15 DIAGNOSIS — D485 Neoplasm of uncertain behavior of skin: Secondary | ICD-10-CM

## 2022-06-15 DIAGNOSIS — K529 Noninfective gastroenteritis and colitis, unspecified: Secondary | ICD-10-CM | POA: Diagnosis not present

## 2022-06-15 DIAGNOSIS — J449 Chronic obstructive pulmonary disease, unspecified: Secondary | ICD-10-CM

## 2022-06-15 DIAGNOSIS — N179 Acute kidney failure, unspecified: Secondary | ICD-10-CM | POA: Diagnosis not present

## 2022-06-15 DIAGNOSIS — E669 Obesity, unspecified: Secondary | ICD-10-CM | POA: Diagnosis present

## 2022-06-15 DIAGNOSIS — K432 Incisional hernia without obstruction or gangrene: Secondary | ICD-10-CM | POA: Diagnosis not present

## 2022-06-15 DIAGNOSIS — A0472 Enterocolitis due to Clostridium difficile, not specified as recurrent: Secondary | ICD-10-CM | POA: Insufficient documentation

## 2022-06-15 DIAGNOSIS — J9621 Acute and chronic respiratory failure with hypoxia: Secondary | ICD-10-CM | POA: Diagnosis present

## 2022-06-15 HISTORY — PX: IR NEPHROSTOMY PLACEMENT LEFT: IMG6063

## 2022-06-15 HISTORY — PX: IR NEPHROSTOMY PLACEMENT RIGHT: IMG6064

## 2022-06-15 LAB — BLOOD CULTURE ID PANEL (REFLEXED) - BCID2

## 2022-06-15 LAB — COMPREHENSIVE METABOLIC PANEL
ALT: 9 U/L (ref 0–44)
AST: 9 U/L — ABNORMAL LOW (ref 15–41)
Albumin: 2.8 g/dL — ABNORMAL LOW (ref 3.5–5.0)
Alkaline Phosphatase: 45 U/L (ref 38–126)
Anion gap: 15 (ref 5–15)
BUN: 104 mg/dL — ABNORMAL HIGH (ref 8–23)
CO2: 16 mmol/L — ABNORMAL LOW (ref 22–32)
Calcium: 8.5 mg/dL — ABNORMAL LOW (ref 8.9–10.3)
Chloride: 108 mmol/L (ref 98–111)
Creatinine, Ser: 7.04 mg/dL — ABNORMAL HIGH (ref 0.61–1.24)
GFR, Estimated: 7 mL/min — ABNORMAL LOW (ref >60)
Glucose, Bld: 134 mg/dL — ABNORMAL HIGH (ref 70–99)
Potassium: 4.8 mmol/L (ref 3.5–5.1)
Sodium: 139 mmol/L (ref 135–145)
Total Bilirubin: 0.6 mg/dL (ref 0.3–1.2)
Total Protein: 6.2 g/dL — ABNORMAL LOW (ref 6.5–8.1)

## 2022-06-15 LAB — CBC
HCT: 30.8 % — ABNORMAL LOW (ref 39.0–52.0)
Hemoglobin: 9.9 g/dL — ABNORMAL LOW (ref 13.0–17.0)
MCH: 31 pg (ref 26.0–34.0)
MCHC: 32.1 g/dL (ref 30.0–36.0)
MCV: 96.6 fL (ref 80.0–100.0)
Platelets: 379 10*3/uL (ref 150–400)
RBC: 3.19 MIL/uL — ABNORMAL LOW (ref 4.22–5.81)
RDW: 13.9 % (ref 11.5–15.5)
WBC: 14.2 10*3/uL — ABNORMAL HIGH (ref 4.0–10.5)
nRBC: 0 % (ref 0.0–0.2)

## 2022-06-15 LAB — URINE CULTURE

## 2022-06-15 LAB — PROTIME-INR
INR: 1.2 (ref 0.8–1.2)
Prothrombin Time: 15.4 seconds — ABNORMAL HIGH (ref 11.4–15.2)

## 2022-06-15 LAB — C DIFFICILE QUICK SCREEN W PCR REFLEX
C Diff antigen: NEGATIVE
C Diff interpretation: NOT DETECTED
C Diff toxin: NEGATIVE

## 2022-06-15 LAB — CULTURE, BLOOD (ROUTINE X 2)

## 2022-06-15 MED ORDER — SODIUM CHLORIDE 0.9 % IV SOLN
INTRAVENOUS | Status: AC | PRN
  Administered 2022-06-15: 2 g via INTRAVENOUS

## 2022-06-15 MED ORDER — SODIUM CHLORIDE 0.9 % IV SOLN
1.0000 g | INTRAVENOUS | Status: DC
  Administered 2022-06-16 – 2022-06-19 (×4): 1 g via INTRAVENOUS
  Filled 2022-06-15 (×4): qty 10

## 2022-06-15 MED ORDER — IOHEXOL 300 MG/ML  SOLN
50.0000 mL | Freq: Once | INTRAMUSCULAR | Status: AC | PRN
  Administered 2022-06-15: 20 mL

## 2022-06-15 MED ORDER — MIDAZOLAM HCL 2 MG/2ML IJ SOLN
INTRAMUSCULAR | Status: AC
  Filled 2022-06-15: qty 2

## 2022-06-15 MED ORDER — LIDOCAINE HCL 1 % IJ SOLN
INTRAMUSCULAR | Status: AC
  Filled 2022-06-15: qty 20

## 2022-06-15 MED ORDER — MIDAZOLAM HCL 2 MG/2ML IJ SOLN
INTRAMUSCULAR | Status: AC | PRN
  Administered 2022-06-15 (×2): 1 mg via INTRAVENOUS

## 2022-06-15 MED ORDER — FENTANYL CITRATE (PF) 100 MCG/2ML IJ SOLN
INTRAMUSCULAR | Status: AC | PRN
  Administered 2022-06-15 (×2): 25 ug via INTRAVENOUS
  Administered 2022-06-15: 50 ug via INTRAVENOUS

## 2022-06-15 MED ORDER — FENTANYL CITRATE (PF) 100 MCG/2ML IJ SOLN
INTRAMUSCULAR | Status: AC
  Filled 2022-06-15: qty 2

## 2022-06-15 MED ORDER — SODIUM CHLORIDE 0.9 % IV SOLN: 2.0000 g | Freq: Once | INTRAVENOUS | Status: DC

## 2022-06-15 MED ORDER — GERHARDT'S BUTT CREAM
TOPICAL_CREAM | Freq: Two times a day (BID) | CUTANEOUS | Status: DC
  Filled 2022-06-15 (×2): qty 1

## 2022-06-15 MED ORDER — SODIUM CHLORIDE 0.9 % IV SOLN
INTRAVENOUS | Status: AC
  Filled 2022-06-15: qty 20

## 2022-06-15 MED ORDER — CHLORHEXIDINE GLUCONATE CLOTH 2 % EX PADS
6.0000 | MEDICATED_PAD | Freq: Every day | CUTANEOUS | Status: DC
  Administered 2022-06-15 – 2022-06-21 (×7): 6 via TOPICAL

## 2022-06-15 NOTE — Progress Notes (Signed)
Patient admitted this shift. Patient verbalizes dicomfort with rolling in bed. 16Fr Foley in for retention per ED nurse. Urine leaking around catheter, 8ml added to balloon. Urine is red with clots 400 ML output that could be measured unsure of amount that still leaks out around catheter. Continued to monitor.

## 2022-06-15 NOTE — Consult Note (Signed)
Urology Consult  Referring physician: Dr. Arbutus Leas Reason for referral: bilateral hydronephrosis  Chief Complaint: difficulty urinating  History of Present Illness: [name] is a 87yo with a history of CAD, colon cancer s/p APR and radiation, HTN who was admitted with acute renal failure. For the past 2-3 weeks weeks he has noted difficulty urinating, decreased urine output and worsening abdominal distention. CT on admission showed bilateral hydronephrosis with significant bladder wall thickening at the UVJs as well as pelvic lymphadenopathy concerning for metastatic disease. Creatinine on admission was 7 up from a baseline of 0.8. A foley catheter was placed and the patient has made 400cc since foley placement.   Past Medical History:  Diagnosis Date   Arthritis    knees, hips   CAD (coronary artery disease)    Abnormal stress perfusion study with apical and inferior wall ischemia. LAD had a long 99% stenosis followed by 30% stenosis, first diagonal 25% stenosis, circumflex luminal irregularities, second obtuse marginal 25% stenosis, RCA 40% followed by 40% stenosis. EF 60%. Stenting with 2 bare metal stents by Dr. Excell Seltzer to the LAD.   Colon cancer [facility])    Radiation, chemo, surgery 2011   Complication of anesthesia    Pt states he coughs alot during anesthesia   COPD (chronic obstructive pulmonary disease) (HCC)    Dyspnea    when walking, knee pain    Dysrhythmia    History of kidney stones    passed x1   History of tobacco use    HOH (hard of hearing)    Hypertension    Pneumonia    many times as a child   Past Surgical History:  Procedure Laterality Date   ABDOMINAL AORTOGRAM W/LOWER EXTREMITY N/A 01/09/2019   Procedure: ABDOMINAL AORTOGRAM W/LOWER EXTREMITY;  Surgeon: Maeola Harman, MD;  Location: [facility] INVASIVE CV LAB;  Service: Cardiovascular;  Laterality: N/A;   BRONCHIAL BIOPSY  08/15/2019   Procedure: BRONCHIAL BIOPSIES;  Surgeon: Josephine Igo, DO;  Location: [facility]  ENDOSCOPY;  Service: Pulmonary;;   BRONCHIAL BRUSHINGS  08/15/2019   Procedure: BRONCHIAL BRUSHINGS;  Surgeon: Josephine Igo, DO;  Location: [facility] ENDOSCOPY;  Service: Pulmonary;;   BRONCHIAL NEEDLE ASPIRATION BIOPSY  08/15/2019   Procedure: BRONCHIAL NEEDLE ASPIRATION BIOPSIES;  Surgeon: Josephine Igo, DO;  Location: [facility] ENDOSCOPY;  Service: Pulmonary;;   BRONCHIAL WASHINGS  08/15/2019   Procedure: BRONCHIAL WASHINGS;  Surgeon: Josephine Igo, DO;  Location: [facility] ENDOSCOPY;  Service: Pulmonary;;   CATARACT EXTRACTION W/PHACO Left 05/29/2016   Procedure: CATARACT EXTRACTION PHACO AND INTRAOCULAR LENS PLACEMENT (IOC);  Surgeon: Fabio Pierce, MD;  Location: [facility] ORS;  Service: Ophthalmology;  Laterality: Left;  CDE: 10.39   CATARACT EXTRACTION W/PHACO Right 06/12/2016   Procedure: CATARACT EXTRACTION PHACO AND INTRAOCULAR LENS PLACEMENT RIGHT EYE CDE= 13.59;  Surgeon: Fabio Pierce, MD;  Location: [facility] ORS;  Service: Ophthalmology;  Laterality: Right;  right   CORONARY ANGIOPLASTY WITH STENT PLACEMENT  2008   x 2   HEMICOLECTOMY  01/10/2010   INGUINAL HERNIA REPAIR  2012   PORTACATH PLACEMENT  2011   portacath removal  2011   UMBILICAL HERNIA REPAIR  01/2008   left   VIDEO BRONCHOSCOPY WITH ENDOBRONCHIAL NAVIGATION N/A 08/15/2019   Procedure: VIDEO BRONCHOSCOPY WITH ENDOBRONCHIAL NAVIGATION;  Surgeon: Josephine Igo, DO;  Location: [facility] ENDOSCOPY;  Service: Pulmonary;  Laterality: N/A;    Medications: I have reviewed the patient's current medications. Allergies:  Allergies  Allergen Reactions   Other  General anesthesia - cough    Sulfa Antibiotics     Unknown reaction - we had listed nausea     Family History  Adopted: Yes   Social History:  reports that he quit smoking about 18 years ago. His smoking use included cigarettes. He has a 171.00 pack-year smoking history. He has been exposed to tobacco smoke. He has never used smokeless tobacco. He reports that he does not drink alcohol and  does not use drugs.  Review of Systems  Genitourinary:  Positive for decreased urine volume and difficulty urinating.  All other systems reviewed and are negative.   Physical Exam:  Vital signs in last 24 hours: Temp:  [98.1 F (36.7 C)-99.2 F (37.3 C)] 98.7 F (37.1 C) (05/06 0726) Pulse Rate:  [81-94] 81 (05/06 0726) Resp:  [14-32] 14 (05/06 0726) BP: (113-156)/(59-78) 141/72 (05/06 0726) SpO2:  [93 %-97 %] 97 % (05/06 0730) FiO2 (%):  [28 %] 28 % (05/05 2022) Weight:  [113.4 kg] 113.4 kg (05/05 1425) Physical Exam Vitals reviewed.  Constitutional:      Appearance: He is well-developed.  HENT:     Head: Normocephalic and atraumatic.  Eyes:     Extraocular Movements: Extraocular movements intact.     Pupils: Pupils are equal, round, and reactive to light.  Cardiovascular:     Rate and Rhythm: Normal rate and regular rhythm.  Pulmonary:     Effort: Pulmonary effort is normal. No respiratory distress.  Abdominal:     General: Abdomen is flat. There is no distension.  Skin:    General: Skin is warm and dry.  Neurological:     General: No focal deficit present.     Mental Status: He is alert and oriented to person, place, and time.  Psychiatric:        Mood and Affect: Mood normal.        Behavior: Behavior normal.     Laboratory Data:  Results for orders placed or performed during the hospital encounter of 06/10/2022 (from the past 72 hour(s))  Urinalysis, Routine w reflex microscopic -Urine, Clean Catch     Status: Abnormal   Collection Time: 07/02/2022  2:55 PM  Result Value Ref Range   Color, Urine YELLOW YELLOW   APPearance CLOUDY (A) CLEAR   Specific Gravity, Urine 1.011 1.005 - 1.030   pH 5.0 5.0 - 8.0   Glucose, UA NEGATIVE NEGATIVE mg/dL   Hgb urine dipstick LARGE (A) NEGATIVE   Bilirubin Urine NEGATIVE NEGATIVE   Ketones, ur 5 (A) NEGATIVE mg/dL   Protein, ur 161 (A) NEGATIVE mg/dL   Nitrite NEGATIVE NEGATIVE   Leukocytes,Ua MODERATE (A) NEGATIVE   RBC  / HPF >50 0 - 5 RBC/hpf   WBC, UA 21-50 0 - 5 WBC/hpf   Bacteria, UA FEW (A) NONE SEEN   Squamous Epithelial / HPF 0-5 0 - 5 /HPF   WBC Clumps PRESENT    Hyaline Casts, UA PRESENT    Amorphous Crystal PRESENT     Comment: Performed at [facility], [address], [place], [place] [address]  CBC     Status: Abnormal   Collection Time: 06/19/2022  2:59 PM  Result Value Ref Range   WBC 15.6 (H) 4.0 - 10.5 K/uL   RBC 3.92 (L) 4.22 - 5.81 MIL/uL   Hemoglobin 12.3 (L) 13.0 - 17.0 g/dL   HCT 54.0 (L) 98.1 - 19.1 %   MCV 96.7 80.0 - 100.0 fL   MCH 31.4 26.0 -  34.0 pg   MCHC 32.5 30.0 - 36.0 g/dL   RDW 09.8 11.9 - 14.7 %   Platelets 461 (H) 150 - 400 K/uL   nRBC 0.0 0.0 - 0.2 %    Comment: Performed at [facility], [address], [place], [place] [address]  Comprehensive metabolic panel     Status: Abnormal   Collection Time: 06/29/2022  2:59 PM  Result Value Ref Range   Sodium 139 135 - 145 mmol/L   Potassium 5.6 (H) 3.5 - 5.1 mmol/L   Chloride 107 98 - 111 mmol/L   CO2 15 (L) 22 - 32 mmol/L   Glucose, Bld 104 (H) 70 - 99 mg/dL    Comment: Glucose reference range applies only to samples taken after fasting for at least 8 hours.   BUN 105 (H) 8 - 23 mg/dL    Comment: CRITICAL RESULT CALLED TO, READ BACK BY AND VERIFIED WITH KING, C AT 1607 ON 06/18/2022 BY [facility]. RESULT CONFIRMED BY MANUAL DILUTION    Creatinine, Ser 7.04 (H) 0.61 - 1.24 mg/dL   Calcium 9.0 8.9 - 21.3 mg/dL   Total Protein 7.3 6.5 - 8.1 g/dL   Albumin 3.5 3.5 - 5.0 g/dL   AST 13 (L) 15 - 41 U/L   ALT 11 0 - 44 U/L   Alkaline Phosphatase 63 38 - 126 U/L   Total Bilirubin 0.9 0.3 - 1.2 mg/dL   GFR, Estimated 7 (L) >60 mL/min    Comment: (NOTE) Calculated using the CKD-EPI Creatinine Equation (2021)    Anion gap 17 (H) 5 - 15    Comment: Performed at [facility], [address] [place], [place] [address]  Lipase, blood     Status: None   Collection Time: 07/03/2022  2:59 PM  Result Value Ref Range   Lipase 30 11 - 51  U/L    Comment: Performed at [facility], [address], [place], [place] [address]  Lactic acid, plasma     Status: None   Collection Time: 06/10/2022  2:59 PM  Result Value Ref Range   Lactic Acid, Venous 1.0 0.5 - 1.9 mmol/L    Comment: Performed at [facility], [address] [place], [place] [address]  Blood culture (routine x 2)     Status: None (Preliminary result)   Collection Time: 06/11/2022  2:59 PM   Specimen: Right Antecubital; Blood  Result Value Ref Range   Specimen Description      RIGHT ANTECUBITAL BOTTLES DRAWN AEROBIC AND ANAEROBIC   Special Requests      Blood Culture results may not be optimal due to an excessive volume of blood received in culture bottles Performed at [facility], [address], [place], [place] [address]    Culture PENDING    Report Status PENDING   Blood culture (routine x 2)     Status: None (Preliminary result)   Collection Time: 06/27/2022  3:12 PM   Specimen: Left Antecubital; Blood  Result Value Ref Range   Specimen Description      LEFT ANTECUBITAL BOTTLES DRAWN AEROBIC AND ANAEROBIC   Special Requests      Blood Culture results may not be optimal due to an excessive volume of blood received in culture bottles Performed at [facility], [address] [place], [place] [address]    Culture PENDING    Report Status PENDING   Basic metabolic panel     Status: Abnormal   Collection Time: 06/20/2022  6:46 PM  Result  Value Ref Range   Sodium 138 135 - 145 mmol/L   Potassium 5.7 (H) 3.5 - 5.1 mmol/L   Chloride 108 98 - 111 mmol/L   CO2 13 (L) 22 - 32 mmol/L   Glucose, Bld 106 (H) 70 - 99 mg/dL    Comment: Glucose reference range applies only to samples taken after fasting for at least 8 hours.   BUN 105 (H) 8 - 23 mg/dL    Comment: RESULT CONFIRMED BY MANUAL DILUTION CRITICAL RESULT CALLED TO, READ BACK BY AND VERIFIED WITH BARRON, K AT 1946 ON 06/11/2022 BY [facility]N.    Creatinine, Ser 7.06 (H) 0.61 - 1.24 mg/dL   Calcium 8.6 (L) 8.9 - 10.3  mg/dL   GFR, Estimated 7 (L) >60 mL/min    Comment: (NOTE) Calculated using the CKD-EPI Creatinine Equation (2021)    Anion gap 17 (H) 5 - 15    Comment: Performed at [facility], [address], [place], [place] [address]  CBC     Status: Abnormal   Collection Time: 06/15/22  3:46 AM  Result Value Ref Range   WBC 14.2 (H) 4.0 - 10.5 K/uL   RBC 3.19 (L) 4.22 - 5.81 MIL/uL   Hemoglobin 9.9 (L) 13.0 - 17.0 g/dL   HCT 60.4 (L) 54.0 - 98.1 %   MCV 96.6 80.0 - 100.0 fL   MCH 31.0 26.0 - 34.0 pg   MCHC 32.1 30.0 - 36.0 g/dL   RDW 19.1 47.8 - 29.5 %   Platelets 379 150 - 400 K/uL   nRBC 0.0 0.0 - 0.2 %    Comment: Performed at [facility], [address] [place], [place] [address]  Comprehensive metabolic panel     Status: Abnormal   Collection Time: 06/15/22  3:46 AM  Result Value Ref Range   Sodium 139 135 - 145 mmol/L   Potassium 4.8 3.5 - 5.1 mmol/L   Chloride 108 98 - 111 mmol/L   CO2 16 (L) 22 - 32 mmol/L   Glucose, Bld 134 (H) 70 - 99 mg/dL    Comment: Glucose reference range applies only to samples taken after fasting for at least 8 hours.   BUN 104 (H) 8 - 23 mg/dL    Comment: RESULT CONFIRMED BY MANUAL DILUTION   Creatinine, Ser 7.04 (H) 0.61 - 1.24 mg/dL   Calcium 8.5 (L) 8.9 - 10.3 mg/dL   Total Protein 6.2 (L) 6.5 - 8.1 g/dL   Albumin 2.8 (L) 3.5 - 5.0 g/dL   AST 9 (L) 15 - 41 U/L   ALT 9 0 - 44 U/L   Alkaline Phosphatase 45 38 - 126 U/L   Total Bilirubin 0.6 0.3 - 1.2 mg/dL   GFR, Estimated 7 (L) >60 mL/min    Comment: (NOTE) Calculated using the CKD-EPI Creatinine Equation (2021)    Anion gap 15 5 - 15    Comment: Performed at [facility], [address], [place], [place] [address]   Recent Results (from the past 240 hour(s))  Blood culture (routine x 2)     Status: None (Preliminary result)   Collection Time: 07/08/2022  2:59 PM   Specimen: Right Antecubital; Blood  Result Value Ref Range Status   Specimen Description   Final    RIGHT ANTECUBITAL BOTTLES  DRAWN AEROBIC AND ANAEROBIC   Special Requests   Final    Blood Culture results may not be optimal due to an excessive volume of blood received in culture bottles Performed at [facility], [address] [place], [place] [address]    Culture PENDING  Incomplete   Report Status PENDING  Incomplete  Blood culture (routine x 2)     Status: None (Preliminary result)   Collection Time: 07/07/2022  3:12 PM   Specimen: Left Antecubital; Blood  Result Value Ref Range Status   Specimen Description   Final    LEFT ANTECUBITAL BOTTLES DRAWN AEROBIC AND ANAEROBIC   Special Requests   Final    Blood Culture results may not be optimal due to an excessive volume of blood received in culture bottles Performed at [facility], [address], [place], [place] [address]    Culture PENDING  Incomplete   Report Status PENDING  Incomplete   Creatinine: Recent Labs    07/10/2022 1459 06/29/2022 1846 06/15/22 0346  CREATININE 7.04* 7.06* 7.04*   Baseline Creatinine: 0.8  Impression/Assessment:  86yo with bilateral hydronephrosis and bladder mass  Plan:  I discussed the natural history of hydronephrosis and the various causes of hydronephrosis. The concern based on the CT is he has a bladder tumor obstructing his distal ureters. The patient would benefits from bilateral nephrostomy tube placement with eventual conversion to nephroureteral stents.   Wilkie Aye 06/15/2022, 9:07 AM

## 2022-06-15 NOTE — Consult Note (Signed)
[name] Admit Date: 06/26/2022 06/15/2022 [name] Requesting Physician:  Tat MD  Reason for Consult:  AKI,  HPI:  53M PMH including CAD with history of PCI, hypertension on HCTZ/benazepril, history of colon cancer status post treatment in 2011, history of kidney stones, history of tobacco use, admitted after presenting to the ED yesterday 5/5 with urinary incontinence, incomplete emptying, nausea, loose stools.  Patient recently saw a nephrologist in [place] as initial appointment where renal ultrasound was scheduled for later this month.  He presented to the ED because of the above symptoms and progressive weakness.    In ED found to have creatinine of 7.04 and BUN of 105.  K5.6, bicarbonate 15.  Urine grossly hematuric.  Recent trend in serum creatinine is unavailable.  He underwent CT of the abdomen and pelvis demonstrating marked posterior bladder wall thickening measuring up to 14 mm resulting in severe bilateral hydroureteronephrosis.  Foley catheter was then placed showing that within the bladder there were blood products.  Also present was segmental wall thickening in the distal colon suspicious for underlying inflammatory infectious colitis.  There was retroperitoneal and bilateral inguinal lymphadenopathy concerning for metastatic disease.  Also with concern for a left lower lobe pneumonia.  Urology has been consulted, to see today.  Creatinine unchanged over the past 24 hours.  His hyperkalemia has improved to 4.8 with medical management.  Only 0.4 L of urine output, 1.7 L in yesterday.  He has significant peripheral edema in the lower extremities.  Significant erythema in the left leg with some streaking.   Creat (mg/dL)  Date Value  09/81/1914 0.69   Creatinine, Ser (mg/dL)  Date Value  78/29/5621 7.04 (H)   7.06 (H)  07/08/2022 7.04 (H)  08/15/2019 0.80  01/09/2019 0.80  11/20/2018 0.94  11/18/2018 1.18  11/16/2018 1.16  11/15/2018 1.30 (H)   11/14/2018 1.01  ] I/Os: I/O last 3 completed shifts: In: 1702.8 [P.O.:120; I.V.:464.5; IV Piggyback:1118.3] Out: 400 [Urine:400]   ROS NSAIDS: No exposure IV Contrast exposure TMP/SMX no exposure Hypotension not present Balance of 12 systems is negative w/ exceptions as above  PMH  Past Medical History:  Diagnosis Date   Arthritis    knees, hips   CAD (coronary artery disease)    Abnormal stress perfusion study with apical and inferior wall ischemia. LAD had a long 99% stenosis followed by 30% stenosis, first diagonal 25% stenosis, circumflex luminal irregularities, second obtuse marginal 25% stenosis, RCA 40% followed by 40% stenosis. EF 60%. Stenting with 2 bare metal stents by Dr. Excell Seltzer to the LAD.   Colon cancer [facility])    Radiation, chemo, surgery 2011   Complication of anesthesia    Pt states he coughs alot during anesthesia   COPD (chronic obstructive pulmonary disease) (HCC)    Dyspnea    when walking, knee pain    Dysrhythmia    History of kidney stones    passed x1   History of tobacco use    HOH (hard of hearing)    Hypertension    Pneumonia    many times as a child   PSH  Past Surgical History:  Procedure Laterality Date   ABDOMINAL AORTOGRAM W/LOWER EXTREMITY N/A 01/09/2019   Procedure: ABDOMINAL AORTOGRAM W/LOWER EXTREMITY;  Surgeon: Maeola Harman, MD;  Location: [facility] INVASIVE CV LAB;  Service: Cardiovascular;  Laterality: N/A;   BRONCHIAL BIOPSY  08/15/2019   Procedure: BRONCHIAL BIOPSIES;  Surgeon: Josephine Igo, DO;  Location: [facility] ENDOSCOPY;  Service:  Pulmonary;;   BRONCHIAL BRUSHINGS  08/15/2019   Procedure: BRONCHIAL BRUSHINGS;  Surgeon: Josephine Igo, DO;  Location: [facility] ENDOSCOPY;  Service: Pulmonary;;   BRONCHIAL NEEDLE ASPIRATION BIOPSY  08/15/2019   Procedure: BRONCHIAL NEEDLE ASPIRATION BIOPSIES;  Surgeon: Josephine Igo, DO;  Location: [facility] ENDOSCOPY;  Service: Pulmonary;;   BRONCHIAL WASHINGS  08/15/2019   Procedure: BRONCHIAL  WASHINGS;  Surgeon: Josephine Igo, DO;  Location: [facility] ENDOSCOPY;  Service: Pulmonary;;   CATARACT EXTRACTION W/PHACO Left 05/29/2016   Procedure: CATARACT EXTRACTION PHACO AND INTRAOCULAR LENS PLACEMENT (IOC);  Surgeon: Fabio Pierce, MD;  Location: [facility] ORS;  Service: Ophthalmology;  Laterality: Left;  CDE: 10.39   CATARACT EXTRACTION W/PHACO Right 06/12/2016   Procedure: CATARACT EXTRACTION PHACO AND INTRAOCULAR LENS PLACEMENT RIGHT EYE CDE= 13.59;  Surgeon: Fabio Pierce, MD;  Location: [facility] ORS;  Service: Ophthalmology;  Laterality: Right;  right   CORONARY ANGIOPLASTY WITH STENT PLACEMENT  2008   x 2   HEMICOLECTOMY  01/10/2010   INGUINAL HERNIA REPAIR  2012   PORTACATH PLACEMENT  2011   portacath removal  2011   UMBILICAL HERNIA REPAIR  01/2008   left   VIDEO BRONCHOSCOPY WITH ENDOBRONCHIAL NAVIGATION N/A 08/15/2019   Procedure: VIDEO BRONCHOSCOPY WITH ENDOBRONCHIAL NAVIGATION;  Surgeon: Josephine Igo, DO;  Location: [facility] ENDOSCOPY;  Service: Pulmonary;  Laterality: N/A;   FH  Family History  Adopted: Yes   SH  reports that he quit smoking about 18 years ago. His smoking use included cigarettes. He has a 171.00 pack-year smoking history. He has been exposed to tobacco smoke. He has never used smokeless tobacco. He reports that he does not drink alcohol and does not use drugs. Allergies  Allergies  Allergen Reactions   Other     General anesthesia - cough    Sulfa Antibiotics     Unknown reaction - we had listed nausea    Home medications Prior to Admission medications   Medication Sig Start Date End Date Taking? Authorizing Provider  acetaminophen (TYLENOL) 500 MG tablet Take 1,000 mg by mouth every 6 (six) hours as needed.   Yes [provider]  albuterol (VENTOLIN HFA) 108 (90 Base) MCG/ACT inhaler Inhale 2 puffs into the lungs every 6 (six) hours as needed for wheezing or shortness of breath. Patient taking differently: Inhale 1 puff into the lungs every 6 (six) hours as  needed for wheezing or shortness of breath. 08/29/21  Yes Cobb, Ruby Cola, NP  amLODipine-benazepril (LOTREL) 10-40 MG capsule Take 1 capsule by mouth daily.   Yes [provider]  aspirin 81 MG chewable tablet Chew 81 mg by mouth every evening.   Yes [provider]  atorvastatin (LIPITOR) 20 MG tablet Take 1 tablet (20 mg total) by mouth every evening. 11/22/18  Yes Glade Lloyd, MD  Cholecalciferol (VITAMIN D3) 50 MCG (2000 UT) TABS Take 2,000 Units by mouth daily.   Yes [provider]  Cinnamon 500 MG TABS Take 500 mg by mouth 2 (two) times daily.   Yes [provider]  Cyanocobalamin (VITAMIN B12 PO) Use as directed 1 tablet in the mouth or throat daily.   Yes [provider]  Emollient (CERAVE) CREA Apply 1 application topically 2 (two) times daily.   Yes [provider]  ferrous sulfate 325 (65 FE) MG tablet Take 325 mg by mouth daily with breakfast.   Yes [provider]  Fluticasone-Umeclidin-Vilant (TRELEGY ELLIPTA) 200-62.5-25 MCG/ACT AEPB Inhale 1 puff into the lungs daily.  08/29/21  Yes Cobb, Ruby Cola, NP  hydrochlorothiazide (HYDRODIURIL) 25 MG tablet Take 25 mg by mouth daily.   Yes [provider]  loperamide (IMODIUM A-D) 2 MG tablet Take 2 mg by mouth 4 (four) times daily as needed for diarrhea or loose stools.   Yes [provider]  Magnesium 250 MG TABS Take 250 mg by mouth daily.    Yes [provider]  metoprolol tartrate (LOPRESSOR) 100 MG tablet Take 1 tablet (100 mg total) by mouth at bedtime. 11/22/18  Yes Glade Lloyd, MD  Multiple Vitamins-Minerals (MENS MULTIPLE VITAMIN/LYCOPENE PO) Take 1 tablet by mouth daily.   Yes [provider]  nitroGLYCERIN (NITROSTAT) 0.4 MG SL tablet Place 1 tablet (0.4 mg total) under the tongue every 5 (five) minutes as needed. 12/27/12  Yes Rollene Rotunda, MD  OVER THE COUNTER MEDICATION Take 1 tablet by mouth 2 (two) times daily. Fish,  flax and borage oil   Yes [provider]  Turmeric (QC TUMERIC COMPLEX PO) Take by mouth 2 (two) times daily.   Yes [provider]  zinc gluconate 50 MG tablet Take 50 mg by mouth daily.   Yes [provider]    Current Medications Scheduled Meds:  Gerhardt's butt cream   Topical BID   heparin  5,000 Units Subcutaneous Q12H   ipratropium-albuterol  3 mL Nebulization Q6H   metoprolol tartrate  25 mg Oral BID   Continuous Infusions:  cefTRIAXone (ROCEPHIN)  IV     metronidazole 500 mg (06/15/22 0847)   PRN Meds:.acetaminophen **OR** acetaminophen, ondansetron **OR** ondansetron (ZOFRAN) IV  CBC Recent Labs  Lab 07/06/2022 1459 06/15/22 0346  WBC 15.6* 14.2*  HGB 12.3* 9.9*  HCT 37.9* 30.8*  MCV 96.7 96.6  PLT 461* 379   Basic Metabolic Panel Recent Labs  Lab 07/02/2022 1459 06/13/2022 1846 06/15/22 0346  NA 139 138 139  K 5.6* 5.7* 4.8  CL 107 108 108  CO2 15* 13* 16*  GLUCOSE 104* 106* 134*  BUN 105* 105* 104*  CREATININE 7.04* 7.06* 7.04*  CALCIUM 9.0 8.6* 8.5*    Physical Exam  Blood pressure (!) 141/72, pulse 81, temperature 98.7 F (37.1 C), resp. rate 14, height 6\' 2"  (1.88 m), weight 113.4 kg, SpO2 97 %. GEN: NAD, on stretcher, ENT: Hard of hearing EYES: EOMI CV: Regular, normal S1 and S2 PULM: Diminished in the bases, normal work of breathing ABD: Distended, mildly tender throughout, quiescent bowel sounds SKIN: Left lower extremity with some erythema and streaking. EXT: 4+ edema bilaterally in the legs  Assessment 63M AKI and hyperkalemia 2/2 urinary obstruction in the posterior bladder causing severe hydroureteronephrosis.  Very concerning for bladder cancer.  AKI postrenal/obstructive as below. Hyperkalemia, improved, stable Metabolic acidosis secondary to #1 with mildly increased anion gap Posterior bladder wall thickening/mass with hematuric urine and resultant hydroureteronephrosis CAD Hypertension off home blood  pressure medications; blood pressure stable Anasarca/lower extremity edema with concern for secondary cellulitis Question colitis or changes related to treatment of bladder cancer in 2011 on ceftriaxone and metronidazole Mild leukocytosis with no documented fevers  Plan Urology consult, likely to need bilateral percutaneous nephrostomy tubes, defer to them N.p.o. effective currently No indication for dialysis at the current time, hopefully will have recovery of GFR once obstruction has been relieved We will follow along closely   [name]  06/15/2022, 10:06 AM

## 2022-06-15 NOTE — Procedures (Signed)
Interventional Radiology Procedure Note  Procedure: Placement of bilateral 74F percutaneous nephrostomy tubes  Complications: None  Estimated Blood Loss: None  Recommendations: - Return to [facility] - Tubes to bag drainage   Signed,  Sterling Big, MD

## 2022-06-15 NOTE — Progress Notes (Signed)
PHARMACY - PHYSICIAN COMMUNICATION CRITICAL VALUE ALERT - BLOOD CULTURE IDENTIFICATION (BCID)  [name] is an 87 y.o. male who presented to [facility] on 06/14/2022 with a chief complaint of abdominal pain  Assessment:  87 y.o. male with CAD, prior rectal cancer s/p resection and radiation, multiple issues going after admission for urinary issues for over 2 weeks and findings on CT of bilateral hydronephrosis and a bladder mass and concern for inguinal adenopathy.  In addition , IR to place bilateral nephrostomy tubes. Left leg cellulitis. BCID called and 1/4 bottles +GPC, not identified on BCID.  Name of physician (or Provider) Contacted: Dr. Arbutus Leas  Current antibiotics: Ceftriaxone 1gm IV q24h  Changes to prescribed antibiotics recommended:  No changes on abx at this time per Dr. Arbutus Leas  Results for orders placed or performed during the hospital encounter of 06/14/22  Blood Culture ID Panel (Reflexed) (Collected: 06/14/2022  3:12 PM)  Result Value Ref Range   Enterococcus faecalis NOT DETECTED NOT DETECTED   Enterococcus Faecium NOT DETECTED NOT DETECTED   Listeria monocytogenes NOT DETECTED NOT DETECTED   Staphylococcus species NOT DETECTED NOT DETECTED   Staphylococcus aureus (BCID) NOT DETECTED NOT DETECTED   Staphylococcus epidermidis NOT DETECTED NOT DETECTED   Staphylococcus lugdunensis NOT DETECTED NOT DETECTED   Streptococcus species NOT DETECTED NOT DETECTED   Streptococcus agalactiae NOT DETECTED NOT DETECTED   Streptococcus pneumoniae NOT DETECTED NOT DETECTED   Streptococcus pyogenes NOT DETECTED NOT DETECTED   A.calcoaceticus-baumannii NOT DETECTED NOT DETECTED   Bacteroides fragilis NOT DETECTED NOT DETECTED   Enterobacterales NOT DETECTED NOT DETECTED   Enterobacter cloacae complex NOT DETECTED NOT DETECTED   Escherichia coli NOT DETECTED NOT DETECTED   Klebsiella aerogenes NOT DETECTED NOT DETECTED   Klebsiella oxytoca NOT DETECTED NOT DETECTED   Klebsiella pneumoniae  NOT DETECTED NOT DETECTED   Proteus species NOT DETECTED NOT DETECTED   Salmonella species NOT DETECTED NOT DETECTED   Serratia marcescens NOT DETECTED NOT DETECTED   Haemophilus influenzae NOT DETECTED NOT DETECTED   Neisseria meningitidis NOT DETECTED NOT DETECTED   Pseudomonas aeruginosa NOT DETECTED NOT DETECTED   Stenotrophomonas maltophilia NOT DETECTED NOT DETECTED   Candida albicans NOT DETECTED NOT DETECTED   Candida auris NOT DETECTED NOT DETECTED   Candida glabrata NOT DETECTED NOT DETECTED   Candida krusei NOT DETECTED NOT DETECTED   Candida parapsilosis NOT DETECTED NOT DETECTED   Candida tropicalis NOT DETECTED NOT DETECTED   Cryptococcus neoformans/gattii NOT DETECTED NOT DETECTED    Elder Cyphers, BS Pharm D, BCPS Clinical Pharmacist 06/15/2022  5:29 PM

## 2022-06-15 NOTE — Consult Note (Addendum)
[facility] Consult  Reason for Consult: Ventral hernia  Referring Physician: ED   Chief Complaint   Abdominal Pain     HPI: [name] is a 87 y.o. male with CAD, prior rectal cancer s/p resection and radiation, multiple issues going after admission for urinary issues for over 2 weeks and findings on CT of bilateral hydronephrosis and a bladder mass and concern for inguinal adenopathy. He was found to have a ventral hernia and the ED called me about this hernia and his thickened colon.  His CT scan demonstrated on a fat containing hernia and he was getting admitted for his multiple other issues and I would see him during this admission.  He is hurting in his back now and is going down to [place] for IR to place bilateral nephrostomy tubes. His wife says he has been lying in bed and she has been changing him and he has been wearing a depends. She has seen some blood in his stool at times but thought it was a hemorrhoid.   He has some reported scrotal breakdown.   Past Medical History:  Diagnosis Date   Arthritis    knees, hips   CAD (coronary artery disease)    Abnormal stress perfusion study with apical and inferior wall ischemia. LAD had a long 99% stenosis followed by 30% stenosis, first diagonal 25% stenosis, circumflex luminal irregularities, second obtuse marginal 25% stenosis, RCA 40% followed by 40% stenosis. EF 60%. Stenting with 2 bare metal stents by Dr. Excell Seltzer to the LAD.   Colon cancer [facility])    Radiation, chemo, surgery 2011   Complication of anesthesia    Pt states he coughs alot during anesthesia   COPD (chronic obstructive pulmonary disease) (HCC)    Dyspnea    when walking, knee pain    Dysrhythmia    History of kidney stones    passed x1   History of tobacco use    HOH (hard of hearing)    Hypertension    Pneumonia    many times as a child    Past Surgical History:  Procedure Laterality Date   ABDOMINAL AORTOGRAM W/LOWER EXTREMITY N/A  01/09/2019   Procedure: ABDOMINAL AORTOGRAM W/LOWER EXTREMITY;  Surgeon: Maeola Harman, MD;  Location: [facility] INVASIVE CV LAB;  Service: Cardiovascular;  Laterality: N/A;   BRONCHIAL BIOPSY  08/15/2019   Procedure: BRONCHIAL BIOPSIES;  Surgeon: Josephine Igo, DO;  Location: [facility] ENDOSCOPY;  Service: Pulmonary;;   BRONCHIAL BRUSHINGS  08/15/2019   Procedure: BRONCHIAL BRUSHINGS;  Surgeon: Josephine Igo, DO;  Location: [facility] ENDOSCOPY;  Service: Pulmonary;;   BRONCHIAL NEEDLE ASPIRATION BIOPSY  08/15/2019   Procedure: BRONCHIAL NEEDLE ASPIRATION BIOPSIES;  Surgeon: Josephine Igo, DO;  Location: [facility] ENDOSCOPY;  Service: Pulmonary;;   BRONCHIAL WASHINGS  08/15/2019   Procedure: BRONCHIAL WASHINGS;  Surgeon: Josephine Igo, DO;  Location: [facility] ENDOSCOPY;  Service: Pulmonary;;   CATARACT EXTRACTION W/PHACO Left 05/29/2016   Procedure: CATARACT EXTRACTION PHACO AND INTRAOCULAR LENS PLACEMENT (IOC);  Surgeon: Fabio Pierce, MD;  Location: [facility] ORS;  Service: Ophthalmology;  Laterality: Left;  CDE: 10.39   CATARACT EXTRACTION W/PHACO Right 06/12/2016   Procedure: CATARACT EXTRACTION PHACO AND INTRAOCULAR LENS PLACEMENT RIGHT EYE CDE= 13.59;  Surgeon: Fabio Pierce, MD;  Location: [facility] ORS;  Service: Ophthalmology;  Laterality: Right;  right   CORONARY ANGIOPLASTY WITH STENT PLACEMENT  2008   x 2   HEMICOLECTOMY  01/10/2010   INGUINAL HERNIA REPAIR  2012  PORTACATH PLACEMENT  2011   portacath removal  2011   UMBILICAL HERNIA REPAIR  01/2008   left   VIDEO BRONCHOSCOPY WITH ENDOBRONCHIAL NAVIGATION N/A 08/15/2019   Procedure: VIDEO BRONCHOSCOPY WITH ENDOBRONCHIAL NAVIGATION;  Surgeon: Josephine Igo, DO;  Location: [facility]OS[facility];  Service: Pulmonary;  Laterality: N/A;    Family History  Adopted: Yes    Social History   Tobacco Use   Smoking status: Former    Packs/day: 3.00    Years: 57.00    Additional pack years: 0.00    Total pack years: 171.00    Types: Cigarettes    Quit date: 2006     Years since quitting: 18.3    Passive exposure: Past   Smokeless tobacco: Never   Tobacco comments:    quit smoking 2006  Vaping Use   Vaping Use: Never used  Substance Use Topics   Alcohol use: No   Drug use: No    Medications: I have reviewed the patient's current medications. Prior to Admission:  Medications Prior to Admission  Medication Sig Dispense Refill Last Dose   acetaminophen (TYLENOL) 500 MG tablet Take 1,000 mg by mouth every 6 (six) hours as needed.   unknown   albuterol (VENTOLIN HFA) 108 (90 Base) MCG/ACT inhaler Inhale 2 puffs into the lungs every 6 (six) hours as needed for wheezing or shortness of breath. (Patient taking differently: Inhale 1 puff into the lungs every 6 (six) hours as needed for wheezing or shortness of breath.) 6.7 g 2 unknown   amLODipine-benazepril (LOTREL) 10-40 MG capsule Take 1 capsule by mouth daily.   06/13/2022   aspirin 81 MG chewable tablet Chew 81 mg by mouth every evening.   06/13/2022   atorvastatin (LIPITOR) 20 MG tablet Take 1 tablet (20 mg total) by mouth every evening.   06/13/2022   Cholecalciferol (VITAMIN D3) 50 MCG (2000 UT) TABS Take 2,000 Units by mouth daily.   06/13/2022   Cinnamon 500 MG TABS Take 500 mg by mouth 2 (two) times daily.   06/13/2022   Cyanocobalamin (VITAMIN B12 PO) Use as directed 1 tablet in the mouth or throat daily.   06/13/2022   Emollient (CERAVE) CREA Apply 1 application topically 2 (two) times daily.   06/13/2022   ferrous sulfate 325 (65 FE) MG tablet Take 325 mg by mouth daily with breakfast.   06/13/2022   Fluticasone-Umeclidin-Vilant (TRELEGY ELLIPTA) 200-62.5-25 MCG/ACT AEPB Inhale 1 puff into the lungs daily. 180 each 6 06/13/2022   hydrochlorothiazide (HYDRODIURIL) 25 MG tablet Take 25 mg by mouth daily.   06/13/2022   loperamide (IMODIUM A-D) 2 MG tablet Take 2 mg by mouth 4 (four) times daily as needed for diarrhea or loose stools.   06/13/2022   Magnesium 250 MG TABS Take 250 mg by mouth daily.    06/13/2022    metoprolol tartrate (LOPRESSOR) 100 MG tablet Take 1 tablet (100 mg total) by mouth at bedtime.   06/13/2022 at 2000   Multiple Vitamins-Minerals (MENS MULTIPLE VITAMIN/LYCOPENE PO) Take 1 tablet by mouth daily.   06/13/2022   nitroGLYCERIN (NITROSTAT) 0.4 MG SL tablet Place 1 tablet (0.4 mg total) under the tongue every 5 (five) minutes as needed. 25 tablet 3 unknown   OVER THE COUNTER MEDICATION Take 1 tablet by mouth 2 (two) times daily. Fish, flax and borage oil   06/13/2022   Turmeric (QC TUMERIC COMPLEX PO) Take by mouth 2 (two) times daily.   06/13/2022   zinc gluconate 50 MG  tablet Take 50 mg by mouth daily.   06/13/2022   Scheduled:  Chlorhexidine Gluconate Cloth  6 each Topical Daily   Gerhardt's butt cream   Topical BID   heparin  5,000 Units Subcutaneous Q12H   ipratropium-albuterol  3 mL Nebulization Q6H   metoprolol tartrate  25 mg Oral BID   Continuous:  [START ON 06/16/2022] cefTRIAXone (ROCEPHIN)  IV     cefTRIAXone (ROCEPHIN)  IV     metronidazole 500 mg (06/15/22 0847)   XBJ:[id number] **OR** acetaminophen, ondansetron **OR** ondansetron (ZOFRAN) IV  Allergies  Allergen Reactions   Other     General anesthesia - cough    Sulfa Antibiotics     Unknown reaction - we had listed nausea      ROS:  A comprehensive review of systems was negative except for: Constitutional: positive for 40 lbs weight loss Gastrointestinal: positive for abdominal hernia Genitourinary: positive for decreased stream and leakage Musculoskeletal: positive for back pain  Blood pressure 130/73, pulse 79, temperature 98 F (36.7 C), temperature source Oral, resp. rate 18, height 6\' 2"  (1.88 m), weight 113.4 kg, SpO2 97 %. Physical Exam Vitals reviewed.  HENT:     Head: Normocephalic.  Eyes:     Extraocular Movements: Extraocular movements intact.  Cardiovascular:     Rate and Rhythm: Normal rate.  Pulmonary:     Effort: Pulmonary effort is normal.  Abdominal:     General: There is no  distension.     Tenderness: There is abdominal tenderness.     Hernia: A hernia is present. Hernia is present in the ventral area.     Comments: Hernia with some thickened skin over the right edge but no cellulitis   Genitourinary:    Rectum: No mass.     Comments: Perineal/ scrotal break down and left upper thigh skin thickening with bluish tinge skin and some warty like hypertrophy, wife report is chronic Musculoskeletal:     Comments: Edema L> R extremities   Lymphadenopathy:     Lower Body: Right inguinal adenopathy present. Left inguinal adenopathy present.  Skin:    General: Skin is warm.  Neurological:     General: No focal deficit present.     Mental Status: He is alert.  Psychiatric:        Mood and Affect: Mood normal.   Skin thickening on abdominal wall- reported to be chronic    Left thigh skin lesion of unknown significance/ ? Breakdown        Results: Results for orders placed or performed during the hospital encounter of  (from the past 48 hour(s))  Urinalysis, Routine w reflex microscopic -Urine, Clean Catch     Status: Abnormal   Collection Time: 06/16/2022  2:55 PM  Result Value Ref Range   Color, Urine YELLOW YELLOW   APPearance CLOUDY (A) CLEAR   Specific Gravity, Urine 1.011 1.005 - 1.030   pH 5.0 5.0 - 8.0   Glucose, UA NEGATIVE NEGATIVE mg/dL   Hgb urine dipstick LARGE (A) NEGATIVE   Bilirubin Urine NEGATIVE NEGATIVE   Ketones, ur 5 (A) NEGATIVE mg/dL   Protein, ur 578 (A) NEGATIVE mg/dL   Nitrite NEGATIVE NEGATIVE   Leukocytes,Ua MODERATE (A) NEGATIVE   RBC / HPF >50 0 - 5 RBC/hpf   WBC, UA 21-50 0 - 5 WBC/hpf   Bacteria, UA FEW (A) NONE SEEN   Squamous Epithelial / HPF 0-5 0 - 5 /HPF   WBC Clumps PRESENT    Hyaline Casts,  UA PRESENT    Amorphous Crystal PRESENT     Comment: Performed at [facility], [address], [place], [place] [address]  CBC     Status: Abnormal   Collection Time: 07/10/2022  2:59 PM  Result Value Ref Range    WBC 15.6 (H) 4.0 - 10.5 K/uL   RBC 3.92 (L) 4.22 - 5.81 MIL/uL   Hemoglobin 12.3 (L) 13.0 - 17.0 g/dL   HCT 19.1 (L) 47.8 - 29.5 %   MCV 96.7 80.0 - 100.0 fL   MCH 31.4 26.0 - 34.0 pg   MCHC 32.5 30.0 - 36.0 g/dL   RDW 62.1 30.8 - 65.7 %   Platelets 461 (H) 150 - 400 K/uL   nRBC 0.0 0.0 - 0.2 %    Comment: Performed at [facility], [address] [place], [place] [address]  Comprehensive metabolic panel     Status: Abnormal   Collection Time: 07/02/2022  2:59 PM  Result Value Ref Range   Sodium 139 135 - 145 mmol/L   Potassium 5.6 (H) 3.5 - 5.1 mmol/L   Chloride 107 98 - 111 mmol/L   CO2 15 (L) 22 - 32 mmol/L   Glucose, Bld 104 (H) 70 - 99 mg/dL    Comment: Glucose reference range applies only to samples taken after fasting for at least 8 hours.   BUN 105 (H) 8 - 23 mg/dL    Comment: CRITICAL RESULT CALLED TO, READ BACK BY AND VERIFIED WITH KING, C AT 1607 ON 06/28/2022 BY SMN. RESULT CONFIRMED BY MANUAL DILUTION    Creatinine, Ser 7.04 (H) 0.61 - 1.24 mg/dL   Calcium 9.0 8.9 - 29.5 mg/dL   Total Protein 7.3 6.5 - 8.1 g/dL   Albumin 3.5 3.5 - 5.0 g/dL   AST 13 (L) 15 - 41 U/L   ALT 11 0 - 44 U/L   Alkaline Phosphatase 63 38 - 126 U/L   Total Bilirubin 0.9 0.3 - 1.2 mg/dL   GFR, Estimated 7 (L) >60 mL/min    Comment: (NOTE) Calculated using the CKD-EPI Creatinine Equation (2021)    Anion gap 17 (H) 5 - 15    Comment: Performed at [facility], [address] [place], [place] [address]  Lipase, blood     Status: None   Collection Time: 06/19/2022  2:59 PM  Result Value Ref Range   Lipase 30 11 - 51 U/L    Comment: Performed at [facility], [address] [place], [place] [address]  Lactic acid, plasma     Status: None   Collection Time: 06/19/2022  2:59 PM  Result Value Ref Range   Lactic Acid, Venous 1.0 0.5 - 1.9 mmol/L    Comment: Performed at [facility], [address], [place], [place] [address]  Blood culture (routine x 2)     Status: None (Preliminary result)    Collection Time: 06/23/2022  2:59 PM   Specimen: Right Antecubital; Blood  Result Value Ref Range   Specimen Description      RIGHT ANTECUBITAL BOTTLES DRAWN AEROBIC AND ANAEROBIC   Special Requests      Blood Culture results may not be optimal due to an excessive volume of blood received in culture bottles Performed at [facility], [address] [place], [place] [address]    Culture PENDING    Report Status PENDING   Blood culture (routine x 2)     Status: None (Preliminary result)   Collection Time: 06/17/2022  3:12 PM  Specimen: Left Antecubital; Blood  Result Value Ref Range   Specimen Description      LEFT ANTECUBITAL BOTTLES DRAWN AEROBIC AND ANAEROBIC   Special Requests      Blood Culture results may not be optimal due to an excessive volume of blood received in culture bottles   Culture  Setup Time      GRAM POSITIVE COCCI Gram Stain Report Called to,Read Back By and Verified With: M. CATES LAND @ 06/15/22 ANAEROBIC BOTTLE ONLY Performed at [facility], [address] [place], [place] [address]    Culture PENDING    Report Status PENDING   Basic metabolic panel     Status: Abnormal   Collection Time: 07/06/2022  6:46 PM  Result Value Ref Range   Sodium 138 135 - 145 mmol/L   Potassium 5.7 (H) 3.5 - 5.1 mmol/L   Chloride 108 98 - 111 mmol/L   CO2 13 (L) 22 - 32 mmol/L   Glucose, Bld 106 (H) 70 - 99 mg/dL    Comment: Glucose reference range applies only to samples taken after fasting for at least 8 hours.   BUN 105 (H) 8 - 23 mg/dL    Comment: RESULT CONFIRMED BY MANUAL DILUTION CRITICAL RESULT CALLED TO, READ BACK BY AND VERIFIED WITH BARRON, K AT 1946 ON 06/27/2022 BY SMN.    Creatinine, Ser 7.06 (H) 0.61 - 1.24 mg/dL   Calcium 8.6 (L) 8.9 - 10.3 mg/dL   GFR, Estimated 7 (L) >60 mL/min    Comment: (NOTE) Calculated using the CKD-EPI Creatinine Equation (2021)    Anion gap 17 (H) 5 - 15    Comment: Performed at [facility], [address], [place], [place] [address]   CBC     Status: Abnormal   Collection Time: 06/15/22  3:46 AM  Result Value Ref Range   WBC 14.2 (H) 4.0 - 10.5 K/uL   RBC 3.19 (L) 4.22 - 5.81 MIL/uL   Hemoglobin 9.9 (L) 13.0 - 17.0 g/dL   HCT 09.8 (L) 11.9 - 14.7 %   MCV 96.6 80.0 - 100.0 fL   MCH 31.0 26.0 - 34.0 pg   MCHC 32.1 30.0 - 36.0 g/dL   RDW 82.9 56.2 - 13.0 %   Platelets 379 150 - 400 K/uL   nRBC 0.0 0.0 - 0.2 %    Comment: Performed at [facility], [address] [place], [place] [address]  Comprehensive metabolic panel     Status: Abnormal   Collection Time: 06/15/22  3:46 AM  Result Value Ref Range   Sodium 139 135 - 145 mmol/L   Potassium 4.8 3.5 - 5.1 mmol/L   Chloride 108 98 - 111 mmol/L   CO2 16 (L) 22 - 32 mmol/L   Glucose, Bld 134 (H) 70 - 99 mg/dL    Comment: Glucose reference range applies only to samples taken after fasting for at least 8 hours.   BUN 104 (H) 8 - 23 mg/dL    Comment: RESULT CONFIRMED BY MANUAL DILUTION   Creatinine, Ser 7.04 (H) 0.61 - 1.24 mg/dL   Calcium 8.5 (L) 8.9 - 10.3 mg/dL   Total Protein 6.2 (L) 6.5 - 8.1 g/dL   Albumin 2.8 (L) 3.5 - 5.0 g/dL   AST 9 (L) 15 - 41 U/L   ALT 9 0 - 44 U/L   Alkaline Phosphatase 45 38 - 126 U/L   Total Bilirubin 0.6 0.3 - 1.2 mg/dL   GFR, Estimated 7 (L) >60 mL/min  Comment: (NOTE) Calculated using the CKD-EPI Creatinine Equation (2021)    Anion gap 15 5 - 15    Comment: Performed at [facility], [address], [place], [place] [address]    Personally reviewed CT and reviewed with radiology- some colon/anastomotic thickening, no obvious masses of colon, inguinal adenopathy, skin thickening on the left upper thigh, does not extend, prior hernia mesh repair with tacks    US RENAL  Result Date: 06/15/2022 CLINICAL DATA:  [id number] AKI (acute kidney injury) (HCC) [id number] EXAM: RENAL / URINARY TRACT ULTRASOUND COMPLETE COMPARISON:  CT 07/09/2022 FINDINGS: Right Kidney: Renal measurements: 13.1 x 7.0 x 7.6 cm = volume: 363.7 mL. Persistent  moderate hydronephrosis. Left Kidney: Renal measurements: 11.9 x 7.4 x 5.6 cm = volume: 260.1 mL. Persistent moderate hydronephrosis. Bladder: Foley catheter is in place with heterogeneous hyperechoic material adjacent to the Foley balloon, which is avascular. Posterior bladder wall thickening. Other: None. IMPRESSION: Persistent moderate hydronephrosis, similar to recent CT. Intraluminal echogenic material in the bladder which could represent blood clot, correlate with urinalysis. Posterior bladder wall thickening as seen on recent CT. Electronically Signed   By: Caprice Renshaw M.D.   On: 06/15/2022 12:38   US Venous Img Lower Bilateral (DVT)  Result Date: 06/15/2022 CLINICAL DATA:  Bilateral lower extremity pain and edema, left-greater-than-right. Former smoker. Evaluate for DVT. EXAM: BILATERAL LOWER EXTREMITY VENOUS DOPPLER ULTRASOUND TECHNIQUE: Gray-scale sonography with graded compression, as well as color Doppler and duplex ultrasound were performed to evaluate the lower extremity deep venous systems from the level of the common femoral vein and including the common femoral, femoral, profunda femoral, popliteal and calf veins including the posterior tibial, peroneal and gastrocnemius veins when visible. The superficial great saphenous vein was also interrogated. Spectral Doppler was utilized to evaluate flow at rest and with distal augmentation maneuvers in the common femoral, femoral and popliteal veins. COMPARISON:  CT abdomen and pelvis-06/16/2022 FINDINGS: Examination is degraded due to patient body habitus and poor sonographic window RIGHT LOWER EXTREMITY Common Femoral Vein: No evidence of thrombus. Normal compressibility, respiratory phasicity and response to augmentation. Saphenofemoral Junction: No evidence of thrombus. Normal compressibility and flow on color Doppler imaging. Profunda Femoral Vein: No evidence of thrombus. Normal compressibility and flow on color Doppler imaging. Femoral Vein: No  evidence of thrombus. Normal compressibility, respiratory phasicity and response to augmentation. Popliteal Vein: No evidence of thrombus. Normal compressibility, respiratory phasicity and response to augmentation. Calf Veins: No evidence of thrombus. Normal compressibility and flow on color Doppler imaging. Superficial Great Saphenous Vein: No evidence of thrombus. Normal compressibility. Other Findings:  None. LEFT LOWER EXTREMITY Common Femoral Vein: No evidence of thrombus. Normal compressibility, respiratory phasicity and response to augmentation. Saphenofemoral Junction: No evidence of thrombus. Normal compressibility and flow on color Doppler imaging. Profunda Femoral Vein: No evidence of thrombus. Normal compressibility and flow on color Doppler imaging. Femoral Vein: No evidence of thrombus. Normal compressibility, respiratory phasicity and response to augmentation. Popliteal Vein: No evidence of thrombus. Normal compressibility, respiratory phasicity and response to augmentation. Calf Veins: No evidence of thrombus. Normal compressibility and flow on color Doppler imaging. Superficial Great Saphenous Vein: No evidence of thrombus. Normal compressibility. Other Findings: Pathologically enlarged left inguinal lymph nodes as demonstrated on preceding abdominal CT with index inguinal lymph node measuring 1.3 cm in greatest short axis diameter (image 49). There is a moderate amount of subcutaneous edema at the level of the left lower leg and calf. IMPRESSION: 1. No evidence of DVT within the left  lower extremity. 2. Pathologically enlarged left inguinal lymph nodes, similar to preceding abdominal CT performed 06/10/2022. Electronically Signed   By: Simonne Come M.D.   On: 06/15/2022 10:59   DG Chest Port 1V same Day  Result Date: 06/15/2022 CLINICAL DATA:  Dyspnea EXAM: PORTABLE CHEST 1 VIEW COMPARISON:  08/15/2019, CT 06/05/2022 FINDINGS: Stable coarsening of the pulmonary interstitium. The lungs are  symmetrically well inflated. Known spiculated pulmonary nodule within the peripheral right upper lobe seen on prior CT examination is not well appreciated on this exam. New retrocardiac opacity in keeping with a focal pulmonary infiltrate within this region, possibly infectious in the acute setting. No pneumothorax or pleural effusion. Cardiac size within normal limits. Central pulmonary vascular congestion without overt pulmonary edema. IMPRESSION: 1. New retrocardiac pulmonary infiltrate, possibly infectious in the acute setting. 2. Central pulmonary vascular congestion without overt pulmonary edema. 3. Known right upper lobe spiculated pulmonary nodule not well appreciated on this exam. Electronically Signed   By: Helyn Numbers M.D.   On: 06/15/2022 00:48   CT ABDOMEN PELVIS WO CONTRAST  Result Date: 07/07/2022 CLINICAL DATA:  Abdominal pain, nausea and diarrhea, history of colon cancer EXAM: CT ABDOMEN AND PELVIS WITHOUT CONTRAST TECHNIQUE: Multidetector CT imaging of the abdomen and pelvis was performed following the standard protocol without IV contrast. Unenhanced CT was performed per clinician order. Lack of IV contrast limits sensitivity and specificity, especially for evaluation of abdominal/pelvic solid viscera. RADIATION DOSE REDUCTION: This exam was performed according to the departmental dose-optimization program which includes automated exposure control, adjustment of the mA and/or kV according to patient size and/or use of iterative reconstruction technique. COMPARISON:  05/16/2022 FINDINGS: Lower chest: New consolidation within the posterior left lower lobe at the costophrenic angle, which could be atelectasis or acute airspace disease. No pleural effusion. Hepatobiliary: Unremarkable unenhanced appearance of the liver. The gallbladder is moderately distended, without evidence of cholelithiasis or cholecystitis. Pancreas: Unremarkable unenhanced appearance. Spleen: Unremarkable unenhanced  appearance. Adrenals/Urinary Tract: Bilateral hydronephrosis is again identified, as seen on recent chest CT. There is also bilateral hydroureter to the level of the base of the bladder. There is marked posterior bladder wall thickening, measuring up to 14 mm. High attenuation material seen within the bladder surrounding the Foley catheter balloon consistent with blood products. Differential diagnosis include cystitis, chronic scarring from previous radiation therapy in a patient with a history of colon cancer, or bladder neoplasm. Urology consultation recommended. There are bilateral nonobstructing renal calculi, measuring 5 mm on the left and 3 mm on the right. Simple appearing right renal cysts do not require specific imaging follow-up. The adrenals are unremarkable. Stomach/Bowel: Postsurgical changes are seen from distal colectomy and reanastomosis. There is segmental wall thickening of the descending colon extending to the level of the anastomosis, compatible with inflammatory or infectious colitis. Moderate distension of the colon with retained gas and stool. No evidence of small-bowel obstruction. Vascular/Lymphatic: Lymphadenopathy has developed within the bilateral inguinal regions and retroperitoneum. Index lymph node in the left inguinal region reference image 76/2 measures 15 mm in short axis. Left para-aortic adenopathy measures up to 14 mm in short axis reference image 43/2. There is extensive atherosclerosis throughout the aorta and its distal branches. Evaluation of the vascular lumen is limited without IV contrast. Reproductive: The prostate is enlarged, measuring 5.7 x 4.8 cm in cross-sectional diameter. Other: There is diffuse fat stranding in the perirectal region, surrounding the prostate, and adjacent to the wall thickening at the base of the bladder. This  is nonspecific, and could be related to prior radiation therapy. There is no free intraperitoneal fluid or free intraperitoneal gas. There  are fat containing midline ventral hernias. The periumbilical fat containing ventral hernia on image 53 demonstrates stranding within the herniated fat, could reflect incarceration. No bowel herniation. There is also evidence of prior ventral hernia repair. Musculoskeletal: No acute or destructive bony lesions. Stable severe right hip osteoarthritis. Reconstructed images demonstrate no additional findings. IMPRESSION: 1. Marked wall thickening of the posterior aspect of the bladder, measuring up to 14 mm. This results in severe bilateral hydroureteronephrosis. Differential diagnosis includes neoplasm, post radiation change, or cystitis. High attenuation material surrounding the Foley catheter within the bladder lumen consistent with blood products. Urology consultation and cystoscopy recommended. 2. Segmental wall thickening of the distal colon, extending from the splenic flexure through the rectosigmoid anastomosis, suspicious for underlying inflammatory or infectious colitis. 3. New retroperitoneal and bilateral inguinal lymphadenopathy. Metastatic disease not excluded. 4. New dependent consolidation within the left lower lobe posterior costophrenic angle, consistent with pneumonia or atelectasis. 5. Fat containing midline ventral hernias. The inferior ventral hernia demonstrates stranding within the herniated fat, incarcerated hernia not excluded. No bowel herniation. 6. Enlarged prostate. 7. Nonspecific fat stranding within the lower pelvis surrounding the rectosigmoid anastomosis, prostate, and bladder base. This could be sequela of prior radiation therapy. 8.  Aortic Atherosclerosis (ICD10-I70.0). 9. Bilateral nonobstructing renal calculi. Electronically Signed   By: Sharlet Salina M.D.   On: 07/08/2022 17:06     Assessment & Plan:  [name] is a 87 y.o. male with a ventral hernia with fat only, recurrent, thickened colon/ colorectal anastomosis s/p rectal cancer treated in 2011, and an odd appearing  skin lesion I noted while doing a rectal exam.   -Ventral hernia with fat- prior repair noted, recurrence, no need for any intervention at this time, thickened skin changes, does not appear to be cellulitis, photo above to document and monitor  -Colorectal anastomosis/ colon  wall thickening- agree with GI will need a colonoscopy/ flex sig in the future to rule out any issues, reported to have normal colonoscopy in 2015  -Left upper thigh skin lesion of unknown significance- this could be breakdown, it does not appear to be bruising or anything acute, his wife reports it being there before, it does have an odd bluish hue, could be breakdown, could be melanocytic lesion, ultimately may need to biopsy if his bladder mass / cancer is not proven  -Inguinal adenopathy- possibly from bladder mass, no obvious anal masses noted   -Going to IR today to get nephrostomy tubes, if Dr. Ronne Binning takes patient back for cysto in the  future can opt  to do biopsy of the leg lesion then if indicated   -Thickened colon- will need colonoscopy in future   All questions were answered to the satisfaction of the patient and family    Lucretia Roers 06/15/2022, 2:17 PM

## 2022-06-15 NOTE — TOC Progression Note (Signed)
  Transition of Care [facility]) Screening Note   Patient Details  Name: [name] Date of Birth: [birth year]   Transition of Care [facility]) CM/SW Contact:    Leitha Bleak, RN Phone Number: 06/15/2022, 10:41 AM  Urology consulted DC - 2-3 days, TOC following.  Transition of Care Department [facility]) has reviewed patient and no TOC needs have been identified at this time. We will continue to monitor patient advancement through interdisciplinary progression rounds. If new patient transition needs arise, please place a TOC consult.      Barriers to Discharge: Continued Medical Work up  Expected Discharge Plan and Services       Living arrangements for the past 2 months: Single Family Home                    Social Determinants of Health (SDOH) Interventions SDOH Screenings   Tobacco Use: Medium Risk (06/14/2022)

## 2022-06-15 NOTE — Hospital Course (Addendum)
87 year old male with a history of coronary artery disease, hypertension, chronic respiratory failure on 2 L at nighttime, COPD, colorectal adenocarcinoma (09/2009) status post partial colectomy (01/2010)  and chemoradiation with Xeloda.  He finished radiation 12/04/2009.  He had follow-up colonoscopy 12/2013 which was negative.  His CEA tumor markers have been normal.  He states that he has not followed up with oncology in over 5 years at this point. Nevertheless, the patient has been complaining of 2 to 3-week history of abdominal discomfort and distention with difficulty urinating.  He feels like he has incomplete bladder emptying.  He has had some nausea without emesis.  He denies any hematuria or dysuria.  He has had some urinary incontinence.  Regarding his abdominal discomfort, the patient states that it is generalized.  He has had loose stools on a daily basis for the last 2 to 3 weeks.  There is no hematochezia or melena.  He denies any new medications.  He denies any recent travels or exotic foods.  He denies any fevers, chills, chest pain, shortness breath, cough, hemoptysis.  The patient denies any NSAIDs.  In the ED, the patient had low-grade temperature of 99.2 F.  He was hemodynamically stable.  Oxygen saturation was 95% on 2 L.  WBC 15.6, hemoglobin 12.3, platelets 279,000.  LFTs were unremarkable.  Sodium 139, potassium 5.6, bicarbonate 15, serum creatinine 7.04.  UA showed 21-50 WBC. CT of the abdomen pelvis showed LLL consolidation, bilateral hydronephrosis which was already seen on a previous CT chest 06/05/2022, posterior wall bladder thickening with high attenuation material around the Foley catheter, wall thickening @descending  colon at the level of the rectosigmoid.  There is diffuse stranding in the perirectal region surrounding the prostate and bladder.  There was bilateral inguinal and retroperitoneal lymphadenopathy. The patient was admitted for further evaluation and treatment of his  acute kidney injury and colitis.  A Foley catheter was inserted in the emergency department. Renal function did not improve, so nephrology and urology were consulted.   06/20/22: Pt had episodes of coffee ground emesis and hematemesis with hemorrhagic shock, code blue called and intubated, subsequently transferred to ICU and accepted for transfer to [facility]

## 2022-06-15 NOTE — Consult Note (Signed)
Chief Complaint: Patient was seen in consultation today for bilateral hydronephrosis  Referring Physician(s): Dr. Ronne Binning  Supervising Physician: Malachy Moan  Patient Status: [facility]- inpt  History of Present Illness: [name] is a 87 y.o. male with past medical history of CAD, colon cancer s/p APR and radiation who presented to [facility] ED with abdominal pain, and difficulty urinating.   CT Abdomen Pelvis: 1. Marked wall thickening of the posterior aspect of the bladder, measuring up to 14 mm. This results in severe bilateral Hydroureteronephrosis. Differential diagnosis includes neoplasm, post radiation change, or cystitis. High attenuation material surrounding the Foley catheter within the bladder lumen consistent with blood products. Urology consultation and cystoscopy recommended. 2. Segmental wall thickening of the distal colon, extending from the splenic flexure through the rectosigmoid anastomosis, suspicious for underlying inflammatory or infectious colitis. 3. New retroperitoneal and bilateral inguinal lymphadenopathy. Metastatic disease not excluded. 4. New dependent consolidation within the left lower lobe posterior costophrenic angle, consistent with pneumonia or atelectasis. 5. Fat containing midline ventral hernias. The inferior ventral hernia demonstrates stranding within the herniated fat, incarcerated hernia not excluded. No bowel herniation. 6. Enlarged prostate. 7. Nonspecific fat stranding within the lower pelvis surrounding the rectosigmoid anastomosis, prostate, and bladder base. This could be sequela of prior radiation therapy. 8.  Aortic Atherosclerosis (ICD10-I70.0). 9. Bilateral nonobstructing renal calculi.   Patient assessed at bedside alongside his wife.  He has been NPO since 0730 this AM.  He was given heparin inj at 0830, evening dose held.  He does have a foley catheter in place with small amount of bloody urine. They are aware of the goals of the  procedure today and are agreeable to proceed.   Past Medical History:  Diagnosis Date   Arthritis    knees, hips   CAD (coronary artery disease)    Abnormal stress perfusion study with apical and inferior wall ischemia. LAD had a long 99% stenosis followed by 30% stenosis, first diagonal 25% stenosis, circumflex luminal irregularities, second obtuse marginal 25% stenosis, RCA 40% followed by 40% stenosis. EF 60%. Stenting with 2 bare metal stents by Dr. Excell Seltzer to the LAD.   Colon cancer [facility])    Radiation, chemo, surgery 2011   Complication of anesthesia    Pt states he coughs alot during anesthesia   COPD (chronic obstructive pulmonary disease) (HCC)    Dyspnea    when walking, knee pain    Dysrhythmia    History of kidney stones    passed x1   History of tobacco use    HOH (hard of hearing)    Hypertension    Pneumonia    many times as a child    Past Surgical History:  Procedure Laterality Date   ABDOMINAL AORTOGRAM W/LOWER EXTREMITY N/A 01/09/2019   Procedure: ABDOMINAL AORTOGRAM W/LOWER EXTREMITY;  Surgeon: Maeola Harman, MD;  Location: [facility] INVASIVE CV LAB;  Service: Cardiovascular;  Laterality: N/A;   BRONCHIAL BIOPSY  08/15/2019   Procedure: BRONCHIAL BIOPSIES;  Surgeon: Josephine Igo, DO;  Location: [facility] ENDOSCOPY;  Service: Pulmonary;;   BRONCHIAL BRUSHINGS  08/15/2019   Procedure: BRONCHIAL BRUSHINGS;  Surgeon: Josephine Igo, DO;  Location: [facility] ENDOS[facility];  Service: Pulmonary;;   BRONCHIAL NEEDLE ASPIRATION BIOPSY  08/15/2019   Procedure: BRONCHIAL NEEDLE ASPIRATION BIOPSIES;  Surgeon: Josephine Igo, DO;  Location: [facility] ENDOS[facility];  Service: Pulmonary;;   BRONCHIAL WASHINGS  08/15/2019   Procedure: BRONCHIAL WASHINGS;  Surgeon: Josephine Igo, DO;  Location: [facility] ENDOS[facility];  Service: Pulmonary;;   CATARACT EXTRACTION W/PHACO Left 05/29/2016   Procedure: CATARACT EXTRACTION PHACO AND INTRAOCULAR LENS PLACEMENT (IOC);  Surgeon: Fabio Pierce, MD;  Location: [facility];   Service: Ophthalmology;  Laterality: Left;  CDE: 10.39   CATARACT EXTRACTION W/PHACO Right 06/12/2016   Procedure: CATARACT EXTRACTION PHACO AND INTRAOCULAR LENS PLACEMENT RIGHT EYE CDE= 13.59;  Surgeon: Fabio Pierce, MD;  Location: [facility];  Service: Ophthalmology;  Laterality: Right;  right   CORONARY ANGIOPLASTY WITH STENT PLACEMENT  2008   x 2   HEMICOLECTOMY  01/10/2010   INGUINAL HERNIA REPAIR  2012   PORTACATH PLACEMENT  2011   portacath removal  2011   UMBILICAL HERNIA REPAIR  01/2008   left   VIDEO BRONCHOSCOPY WITH ENDOBRONCHIAL NAVIGATION N/A 08/15/2019   Procedure: VIDEO BRONCHOSCOPY WITH ENDOBRONCHIAL NAVIGATION;  Surgeon: Josephine Igo, DO;  Location: [facility];  Service: Pulmonary;  Laterality: N/A;    Allergies: Other and Sulfa antibiotics  Medications: Prior to Admission medications   Medication Sig Start Date End Date Taking? Authorizing Provider  acetaminophen (TYLENOL) 500 MG tablet Take 1,000 mg by mouth every 6 (six) hours as needed.   Yes [provider]  albuterol (VENTOLIN HFA) 108 (90 Base) MCG/ACT inhaler Inhale 2 puffs into the lungs every 6 (six) hours as needed for wheezing or shortness of breath. Patient taking differently: Inhale 1 puff into the lungs every 6 (six) hours as needed for wheezing or shortness of breath. 08/29/21  Yes Cobb, Ruby Cola, NP  amLODipine-benazepril (LOTREL) 10-40 MG capsule Take 1 capsule by mouth daily.   Yes [provider]  aspirin 81 MG chewable tablet Chew 81 mg by mouth every evening.   Yes [provider]  atorvastatin (LIPITOR) 20 MG tablet Take 1 tablet (20 mg total) by mouth every evening. 11/22/18  Yes Glade Lloyd, MD  Cholecalciferol (VITAMIN D3) 50 MCG (2000 UT) TABS Take 2,000 Units by mouth daily.   Yes [provider]  Cinnamon 500 MG TABS Take 500 mg by mouth 2 (two) times daily.   Yes [provider]  Cyanocobalamin (VITAMIN B12 PO) Use as directed 1 tablet in the  mouth or throat daily.   Yes [provider]  Emollient (CERAVE) CREA Apply 1 application topically 2 (two) times daily.   Yes [provider]  ferrous sulfate 325 (65 FE) MG tablet Take 325 mg by mouth daily with breakfast.   Yes [provider]  Fluticasone-Umeclidin-Vilant (TRELEGY ELLIPTA) 200-62.5-25 MCG/ACT AEPB Inhale 1 puff into the lungs daily. 08/29/21  Yes Cobb, Ruby Cola, NP  hydrochlorothiazide (HYDRODIURIL) 25 MG tablet Take 25 mg by mouth daily.   Yes [provider]  loperamide (IMODIUM A-D) 2 MG tablet Take 2 mg by mouth 4 (four) times daily as needed for diarrhea or loose stools.   Yes [provider]  Magnesium 250 MG TABS Take 250 mg by mouth daily.    Yes [provider]  metoprolol tartrate (LOPRESSOR) 100 MG tablet Take 1 tablet (100 mg total) by mouth at bedtime. 11/22/18  Yes Glade Lloyd, MD  Multiple Vitamins-Minerals (MENS MULTIPLE VITAMIN/LYCOPENE PO) Take 1 tablet by mouth daily.   Yes [provider]  nitroGLYCERIN (NITROSTAT) 0.4 MG SL tablet Place 1 tablet (0.4 mg total) under the tongue every 5 (five) minutes as needed. 12/27/12  Yes Rollene Rotunda, MD  OVER THE COUNTER MEDICATION Take 1 tablet by mouth 2 (two) times daily. Fish, flax and borage oil  Yes [provider]  Turmeric (QC TUMERIC COMPLEX PO) Take by mouth 2 (two) times daily.   Yes [provider]  zinc gluconate 50 MG tablet Take 50 mg by mouth daily.   Yes [provider]     Family History  Adopted: Yes    Social History   Socioeconomic History   Marital status: Married    Spouse name: Not on file   Number of children: Not on file   Years of education: Not on file   Highest education level: Not on file  Occupational History   Occupation: Retired  Tobacco Use   Smoking status: Former    Packs/day: 3.00    Years: 57.00    Additional pack years: 0.00    Total pack years: 171.00    Types:  Cigarettes    Quit date: 2006    Years since quitting: 18.3    Passive exposure: Past   Smokeless tobacco: Never   Tobacco comments:    quit smoking 2006  Vaping Use   Vaping Use: Never used  Substance and Sexual Activity   Alcohol use: No   Drug use: No   Sexual activity: Yes  Other Topics Concern   Not on file  Social History Narrative   Married with 3 children and 2 grandchildren   Social Determinants of Health   Financial Resource Strain: Not on file  Food Insecurity: Not on file  Transportation Needs: Not on file  Physical Activity: Not on file  Stress: Not on file  Social Connections: Not on file     Review of Systems: A 12 point ROS discussed and pertinent positives are indicated in the HPI above.  All other systems are negative.  Review of Systems  Constitutional:  Negative for fatigue and fever.  Respiratory:  Negative for cough and shortness of breath.   Cardiovascular:  Negative for chest pain.  Gastrointestinal:  Positive for abdominal pain. Negative for nausea and vomiting.  Genitourinary:  Positive for difficulty urinating and hematuria.  Psychiatric/Behavioral:  Negative for behavioral problems and confusion.     Vital Signs: BP (!) 141/72 (BP Location: Right Arm)   Pulse 81   Temp 98.7 F (37.1 C)   Resp 14   Ht 6\' 2"  (1.88 m)   Wt 250 lb (113.4 kg)   SpO2 97%   BMI 32.10 kg/m   Physical Exam Vitals and nursing note reviewed.  Constitutional:      General: He is not in acute distress.    Appearance: He is well-developed. He is ill-appearing.  Cardiovascular:     Rate and Rhythm: Normal rate and regular rhythm.  Pulmonary:     Effort: Pulmonary effort is normal.     Breath sounds: Normal breath sounds.  Abdominal:     General: Abdomen is flat. There is no distension.     Tenderness: There is no abdominal tenderness.  Genitourinary:    Comments: Foley in place with bloody urine. Skin:    General: Skin is warm and dry.  Neurological:      General: No focal deficit present.     Mental Status: He is alert and oriented to person, place, and time.  Psychiatric:        Mood and Affect: Mood normal.        Behavior: Behavior normal.      MD Evaluation Airway: WNL Heart: WNL Abdomen: WNL Chest/ Lungs: WNL ASA  Classification: 3 Mallampati/Airway Score: Two   Imaging: US RENAL  Result Date: 06/15/2022 CLINICAL DATA:  [id number] AKI (acute kidney injury) (HCC) [id number] EXAM: RENAL / URINARY TRACT ULTRASOUND COMPLETE COMPARISON:  CT 06/16/2022 FINDINGS: Right Kidney: Renal measurements: 13.1 x 7.0 x 7.6 cm = volume: 363.7 mL. Persistent moderate hydronephrosis. Left Kidney: Renal measurements: 11.9 x 7.4 x 5.6 cm = volume: 260.1 mL. Persistent moderate hydronephrosis. Bladder: Foley catheter is in place with heterogeneous hyperechoic material adjacent to the Foley balloon, which is avascular. Posterior bladder wall thickening. Other: None. IMPRESSION: Persistent moderate hydronephrosis, similar to recent CT. Intraluminal echogenic material in the bladder which could represent blood clot, correlate with urinalysis. Posterior bladder wall thickening as seen on recent CT. Electronically Signed   By: Caprice Renshaw M.D.   On: 06/15/2022 12:38   US Venous Img Lower Bilateral (DVT)  Result Date: 06/15/2022 CLINICAL DATA:  Bilateral lower extremity pain and edema, left-greater-than-right. Former smoker. Evaluate for DVT. EXAM: BILATERAL LOWER EXTREMITY VENOUS DOPPLER ULTRASOUND TECHNIQUE: Gray-scale sonography with graded compression, as well as color Doppler and duplex ultrasound were performed to evaluate the lower extremity deep venous systems from the level of the common femoral vein and including the common femoral, femoral, profunda femoral, popliteal and calf veins including the posterior tibial, peroneal and gastrocnemius veins when visible. The superficial great saphenous vein was also interrogated. Spectral Doppler was utilized to  evaluate flow at rest and with distal augmentation maneuvers in the common femoral, femoral and popliteal veins. COMPARISON:  CT abdomen and pelvis-06/23/2022 FINDINGS: Examination is degraded due to patient body habitus and poor sonographic window RIGHT LOWER EXTREMITY Common Femoral Vein: No evidence of thrombus. Normal compressibility, respiratory phasicity and response to augmentation. Saphenofemoral Junction: No evidence of thrombus. Normal compressibility and flow on color Doppler imaging. Profunda Femoral Vein: No evidence of thrombus. Normal compressibility and flow on color Doppler imaging. Femoral Vein: No evidence of thrombus. Normal compressibility, respiratory phasicity and response to augmentation. Popliteal Vein: No evidence of thrombus. Normal compressibility, respiratory phasicity and response to augmentation. Calf Veins: No evidence of thrombus. Normal compressibility and flow on color Doppler imaging. Superficial Great Saphenous Vein: No evidence of thrombus. Normal compressibility. Other Findings:  None. LEFT LOWER EXTREMITY Common Femoral Vein: No evidence of thrombus. Normal compressibility, respiratory phasicity and response to augmentation. Saphenofemoral Junction: No evidence of thrombus. Normal compressibility and flow on color Doppler imaging. Profunda Femoral Vein: No evidence of thrombus. Normal compressibility and flow on color Doppler imaging. Femoral Vein: No evidence of thrombus. Normal compressibility, respiratory phasicity and response to augmentation. Popliteal Vein: No evidence of thrombus. Normal compressibility, respiratory phasicity and response to augmentation. Calf Veins: No evidence of thrombus. Normal compressibility and flow on color Doppler imaging. Superficial Great Saphenous Vein: No evidence of thrombus. Normal compressibility. Other Findings: Pathologically enlarged left inguinal lymph nodes as demonstrated on preceding abdominal CT with index inguinal lymph node  measuring 1.3 cm in greatest short axis diameter (image 49). There is a moderate amount of subcutaneous edema at the level of the left lower leg and calf. IMPRESSION: 1. No evidence of DVT within the left lower extremity. 2. Pathologically enlarged left inguinal lymph nodes, similar to preceding abdominal CT performed 06/20/2022. Electronically Signed   By: Simonne Come M.D.   On: 06/15/2022 10:59   DG Chest Port 1V same Day  Result Date: 06/15/2022 CLINICAL DATA:  Dyspnea EXAM: PORTABLE CHEST 1 VIEW COMPARISON:  08/15/2019, CT 06/05/2022 FINDINGS: Stable coarsening of the pulmonary interstitium. The lungs are symmetrically well inflated. Known spiculated pulmonary nodule within the  peripheral right upper lobe seen on prior CT examination is not well appreciated on this exam. New retrocardiac opacity in keeping with a focal pulmonary infiltrate within this region, possibly infectious in the acute setting. No pneumothorax or pleural effusion. Cardiac size within normal limits. Central pulmonary vascular congestion without overt pulmonary edema. IMPRESSION: 1. New retrocardiac pulmonary infiltrate, possibly infectious in the acute setting. 2. Central pulmonary vascular congestion without overt pulmonary edema. 3. Known right upper lobe spiculated pulmonary nodule not well appreciated on this exam. Electronically Signed   By: Helyn Numbers M.D.   On: 06/15/2022 00:48   CT ABDOMEN PELVIS WO CONTRAST  Result Date: 06/23/2022 CLINICAL DATA:  Abdominal pain, nausea and diarrhea, history of colon cancer EXAM: CT ABDOMEN AND PELVIS WITHOUT CONTRAST TECHNIQUE: Multidetector CT imaging of the abdomen and pelvis was performed following the standard protocol without IV contrast. Unenhanced CT was performed per clinician order. Lack of IV contrast limits sensitivity and specificity, especially for evaluation of abdominal/pelvic solid viscera. RADIATION DOSE REDUCTION: This exam was performed according to the departmental  dose-optimization program which includes automated exposure control, adjustment of the mA and/or kV according to patient size and/or use of iterative reconstruction technique. COMPARISON:  05/16/2022 FINDINGS: Lower chest: New consolidation within the posterior left lower lobe at the costophrenic angle, which could be atelectasis or acute airspace disease. No pleural effusion. Hepatobiliary: Unremarkable unenhanced appearance of the liver. The gallbladder is moderately distended, without evidence of cholelithiasis or cholecystitis. Pancreas: Unremarkable unenhanced appearance. Spleen: Unremarkable unenhanced appearance. Adrenals/Urinary Tract: Bilateral hydronephrosis is again identified, as seen on recent chest CT. There is also bilateral hydroureter to the level of the base of the bladder. There is marked posterior bladder wall thickening, measuring up to 14 mm. High attenuation material seen within the bladder surrounding the Foley catheter balloon consistent with blood products. Differential diagnosis include cystitis, chronic scarring from previous radiation therapy in a patient with a history of colon cancer, or bladder neoplasm. Urology consultation recommended. There are bilateral nonobstructing renal calculi, measuring 5 mm on the left and 3 mm on the right. Simple appearing right renal cysts do not require specific imaging follow-up. The adrenals are unremarkable. Stomach/Bowel: Postsurgical changes are seen from distal colectomy and reanastomosis. There is segmental wall thickening of the descending colon extending to the level of the anastomosis, compatible with inflammatory or infectious colitis. Moderate distension of the colon with retained gas and stool. No evidence of small-bowel obstruction. Vascular/Lymphatic: Lymphadenopathy has developed within the bilateral inguinal regions and retroperitoneum. Index lymph node in the left inguinal region reference image 76/2 measures 15 mm in short axis. Left  para-aortic adenopathy measures up to 14 mm in short axis reference image 43/2. There is extensive atherosclerosis throughout the aorta and its distal branches. Evaluation of the vascular lumen is limited without IV contrast. Reproductive: The prostate is enlarged, measuring 5.7 x 4.8 cm in cross-sectional diameter. Other: There is diffuse fat stranding in the perirectal region, surrounding the prostate, and adjacent to the wall thickening at the base of the bladder. This is nonspecific, and could be related to prior radiation therapy. There is no free intraperitoneal fluid or free intraperitoneal gas. There are fat containing midline ventral hernias. The periumbilical fat containing ventral hernia on image 53 demonstrates stranding within the herniated fat, could reflect incarceration. No bowel herniation. There is also evidence of prior ventral hernia repair. Musculoskeletal: No acute or destructive bony lesions. Stable severe right hip osteoarthritis. Reconstructed images demonstrate no additional findings.  IMPRESSION: 1. Marked wall thickening of the posterior aspect of the bladder, measuring up to 14 mm. This results in severe bilateral hydroureteronephrosis. Differential diagnosis includes neoplasm, post radiation change, or cystitis. High attenuation material surrounding the Foley catheter within the bladder lumen consistent with blood products. Urology consultation and cystoscopy recommended. 2. Segmental wall thickening of the distal colon, extending from the splenic flexure through the rectosigmoid anastomosis, suspicious for underlying inflammatory or infectious colitis. 3. New retroperitoneal and bilateral inguinal lymphadenopathy. Metastatic disease not excluded. 4. New dependent consolidation within the left lower lobe posterior costophrenic angle, consistent with pneumonia or atelectasis. 5. Fat containing midline ventral hernias. The inferior ventral hernia demonstrates stranding within the herniated  fat, incarcerated hernia not excluded. No bowel herniation. 6. Enlarged prostate. 7. Nonspecific fat stranding within the lower pelvis surrounding the rectosigmoid anastomosis, prostate, and bladder base. This could be sequela of prior radiation therapy. 8.  Aortic Atherosclerosis (ICD10-I70.0). 9. Bilateral nonobstructing renal calculi. Electronically Signed   By: Sharlet Salina M.D.   On: 07/04/2022 17:06   CT Chest Wo Contrast  Result Date: 06/09/2022 CLINICAL DATA:  History of colon cancer. Status post chemotherapy and radiation. Lung nodules. EXAM: CT CHEST WITHOUT CONTRAST TECHNIQUE: Multidetector CT imaging of the chest was performed following the standard protocol without IV contrast. RADIATION DOSE REDUCTION: This exam was performed according to the departmental dose-optimization program which includes automated exposure control, adjustment of the mA and/or kV according to patient size and/or use of iterative reconstruction technique. COMPARISON:  CT 09/26/2021 and older FINDINGS: Cardiovascular: Heart is nonenlarged. No pericardial effusion. Coronary artery calcifications are seen. The thoracic aorta has some scattered vascular calcifications. Tortuous course of the descending thoracic aorta. Mediastinum/Nodes: Small hiatal hernia. Normal caliber thoracic esophagus. The esophagus is slightly patulous. Heterogeneous thyroid gland. No specific abnormal lymph node enlargement identified in the axillary region or hilum on this noncontrast examination. There are some small mediastinal nodes, nonpathologic by size criteria. This includes retrocrural. Example series 2, image 132 right retrocrural measures 12 x 9 mm today and previously 12 x 8 mm. Lungs/Pleura: Breathing motion. Dependent atelectasis. No consolidation, pneumothorax or effusion. Upper lung zone centrilobular emphysematous lung changes are identified. Apical pleural thickening on the left-greater-than-right. Spiculated semi-solid nodule seen in  the right upper lobe on the prior that measured 16 mm overall, today on series 2, image 62 again measures 16 by 10 mm, unchanged when measured in the same fashion. The solid component more superior the was measured at 9 mm is stable. On the study of December 2021, the lesion is remeasured in the same fashion as today and at that time would have measured 15 x 9 mm. Very similar. Left upper lobe 5 mm nodule at the apex is stable today on series 2, image 32. This lesion has been stable since December 2021. Upper Abdomen: In the upper abdomen the adrenal glands are diffusely thickened and nodular. New collecting system dilatation of the kidneys. Etiology is uncertain. Musculoskeletal: Diffuse degenerative changes seen along the spine. IMPRESSION: Bilateral lung nodule seen previously are similar today going back to a study of December 2021. Long-term stability. No specific additional imaging follow-up of the lung nodules. Underlying emphysematous lung changes. However there is new collecting system dilatation of the kidneys at the edge of the imaging field of uncertain etiology. Recommend dedicated abdominal imaging to confirm etiology and correlate with known history. Findings will be called to the ordering service by the Radiology physician assistant team Aortic Atherosclerosis (ICD10-I70.0) and  Emphysema (ICD10-J43.9). Electronically Signed   By: Karen Kays M.D.   On: 06/09/2022 15:15    Labs:  CBC: Recent Labs    06/17/2022 1459 06/15/22 0346  WBC 15.6* 14.2*  HGB 12.3* 9.9*  HCT 37.9* 30.8*  PLT 461* 379    COAGS: No results for input(s): "INR", "APTT" in the last 8760 hours.  BMP: Recent Labs    06/12/2022 1459 06/18/2022 1846 06/15/22 0346  NA 139 138 139  K 5.6* 5.7* 4.8  CL 107 108 108  CO2 15* 13* 16*  GLUCOSE 104* 106* 134*  BUN 105* 105* 104*  CALCIUM 9.0 8.6* 8.5*  CREATININE 7.04* 7.06* 7.04*  GFRNONAA 7* 7* 7*    LIVER FUNCTION TESTS: Recent Labs    06/20/2022 1459  06/15/22 0346  BILITOT 0.9 0.6  AST 13* 9*  ALT 11 9  ALKPHOS 63 45  PROT 7.3 6.2*  ALBUMIN 3.5 2.8*    TUMOR MARKERS: No results for input(s): "AFPTM", "CEA", "CA199", "CHROMGRNA" in the last 8760 hours.  Assessment and Plan: Bilateral hydronephrosis due to ureteral obstruction in the setting of suspected bladder cancer +/- metastatic disease Patient with 2-3 week history of feeling poorly, presented to [facility] ED with abdominal pain, hematuria.  CT shows bladder thickening with bilateral ureteral obstruction and hydronephrosis.  IR consulted for bilateral nephrostomy tube placements at the request of Dr. Wilkie Aye.  Patient has been NPO since 0800 today.  He did receive SQ heparin at 0830.   He is not on blood thinners at home.  INR requested.   PA to bedside to discuss procedure with patient and his wife.  They are aware the plan would be for him to return to [facility] post-procedure and will continue work-up with [facility] care team.  They understand the risks and benefits of the procedure as it relates to his medical condition including that drains will likely remain in place at discharge.  He does have multiple right renal cysts which they are made aware of. They are understanding of the goals of this procedure and are agreeable to proceed.   Risks and benefits of bilateral PCN placement was discussed with the patient including, but not limited to, infection, bleeding, significant bleeding causing loss or decrease in renal function or damage to adjacent structures.   All of the patient's questions were answered, patient is agreeable to proceed.  Consent signed and in chart.  Loyce Dys, MS RD PA-C     Thank you for this interesting consult.  I greatly enjoyed meeting [name] and look forward to participating in their care.  A copy of this report was sent to the requesting provider on this date.  Electronically Signed: Hoyt Koch, PA 06/15/2022, 1:52 PM   I  spent a total of 40 Minutes    in face to face in clinical consultation, greater than 50% of which was counseling/coordinating care for bilateral hydronephrosis.

## 2022-06-15 NOTE — Consult Note (Signed)
WOC Nurse Consult Note: Reason for Consult: abrasion of the perineum and LE  Admitted for urinary retention and abdominal pain Wound type: LE edema with serous crusting LLE  Pressure Injury POA: NA Measurement:linear areas on the posterior calf of the LLE Wound bed: serous blistering and crust  Drainage (amount, consistency, odor) serous  Periwound: edema and mild erythema of the LLE Dressing procedure/placement/frequency: Silicone foam to affected area Added triple paste zinc barrier for the perineal/scrotal abrasion  Re consult if needed, will not follow at this time. Thanks  Juliya Magill [employer], RN,CWOCN, CNS, CWON-AP [phone])

## 2022-06-15 NOTE — Progress Notes (Signed)
Positive blood culture   Gram positive Cocci   Reported from Valaria Good in Lab Notified Dr. Arbutus Leas and Angela Nevin, LPN.

## 2022-06-15 NOTE — Progress Notes (Signed)
PROGRESS NOTE  [name] ZOX:[id number] DOB: 1935/03/06 DOA: 06/17/2022 PCP: Verlee Monte, NP  Brief History:  87 year old male with a history of coronary artery disease, hypertension, chronic respiratory failure on 2 L at nighttime, COPD, colorectal adenocarcinoma (09/2009) status post partial colectomy (01/2010)  and chemoradiation with Xeloda.  He finished radiation 12/04/2009.  He had follow-up colonoscopy 12/2013 which was negative.  His CEA tumor markers have been normal.  He states that he has not followed up with oncology in over 5 years at this point. Nevertheless, the patient has been complaining of 2 to 3-week history of abdominal discomfort and distention with difficulty urinating.  He feels like he has incomplete bladder emptying.  He has had some nausea without emesis.  He denies any hematuria or dysuria.  He has had some urinary incontinence.  Regarding his abdominal discomfort, the patient states that it is generalized.  He has had loose stools on a daily basis for the last 2 to 3 weeks.  There is no hematochezia or melena.  He denies any new medications.  He denies any recent travels or exotic foods.  He denies any fevers, chills, chest pain, shortness breath, cough, hemoptysis.  The patient denies any NSAIDs.  In the ED, the patient had low-grade temperature of 99.2 F.  He was hemodynamically stable.  Oxygen saturation was 95% on 2 L.  WBC 15.6, hemoglobin 12.3, platelets 279,000.  LFTs were unremarkable.  Sodium 139, potassium 5.6, bicarbonate 15, serum creatinine 7.04.  UA showed 21-50 WBC. CT of the abdomen pelvis showed LLL consolidation, bilateral hydronephrosis which was already seen on a previous CT chest 06/05/2022, posterior wall bladder thickening with high attenuation material around the Foley catheter, wall thickening @descending  colon at the level of the rectosigmoid.  There is diffuse stranding in the perirectal region surrounding the pliant prostate and  bladder.  There was bilateral inguinal and retroperitoneal lymphadenopathy. The patient was admitted for further evaluation and treatment of his acute kidney injury and colitis.  A Foley catheter was inserted in the emergency department.     Assessment/Plan:  AKI -Last serum creatinine 0.80 on 08/15/2019 -Obtain renal ultrasound -Bilateral hydronephrosis seen on CT abdomen -No improvement in serum creatinine after Foley insertion -Nephrology consult -Accurate I's and O's -Appears hypervolemic>>echo  Hyperkalemia -Patient was started on bicarbonate drip -Given Lokelma  Colitis -Descending colon and perirectal thickening as discussed above -GI consult -Stool pathogen panel -Stool for C. Difficile -Continue ceftriaxone and metronidazole  Bilateral hydronephrosis -Foley inserted -Renal ultrasound post Foley insertion -Given CT findings of bladder thickening and around the prostate, consult urology  Left leg cellulitis -Appears streptococcal -Venous duplex -Continue ceftriaxone  Essential hypertension -Restart metoprolol at lower dose -Hold HCTZ -Hold benazepril -Holding amlodipine temporarily  Acute on chronic respiratory failure with hypoxia -Chronically on 2 L at nighttime -Secondary to pulmonary edema -Discontinue bicarbonate drip  COPD -Continue bronchodilators  Coronary artery disease -No chest pain presently -Continue metoprolol  Mixed hyperlipidemia -Continue statin  Obesity -BMI 32.10 -lifestyle modification        Family Communication:  no Family at bedside  Consultants:  renal, urology, GI  Code Status:  FULL  DVT Prophylaxis:  [place] Heparin   Procedures: As Listed in Progress Note Above  Antibiotics: Ceftriaxone 5/5>> Metronidazole 5/5>>      Subjective: He complains of abdominal discomfort and leg edema.  He has some nausea.  He denies any chest pain, shortness breath,  fevers, chills, cough, hemoptysis.  There is no  hematochezia or melena.  Objective: Vitals:   06/15/22 0022 06/15/22 0507 06/15/22 0726 06/15/22 0730  BP: (!) 156/78 139/74 (!) 141/72   Pulse: 82 87 81   Resp: 20  14   Temp: 98.5 F (36.9 C) 98.6 F (37 C) 98.7 F (37.1 C)   TempSrc:  Oral    SpO2: 93% 95% 96% 97%  Weight:      Height:        Intake/Output Summary (Last 24 hours) at 06/15/2022 0758 Last data filed at 06/15/2022 0736 Gross per 24 hour  Intake 2162.83 ml  Output 400 ml  Net 1762.83 ml   Weight change:  Exam:  General:  Pt is alert, follows commands appropriately, not in acute distress HEENT: No icterus, No thrush, No neck mass, [place]/AT Cardiovascular: RRR, S1/S2, no rubs, no gallops Respiratory: Bibasal crackles.  No wheezing.  Good air movement Abdomen: Soft/+BS, non tender, non distended, no guarding Extremities: 2+ edema, No lymphangitis, No petechiae, No rashes, no synovitis; mild erythema left calf--no crepitance or necrosis   Data Reviewed: I have personally reviewed following labs and imaging studies Basic Metabolic Panel: Recent Labs  Lab 07/08/2022 1459 07/08/2022 1846 06/15/22 0346  NA 139 138 139  K 5.6* 5.7* 4.8  CL 107 108 108  CO2 15* 13* 16*  GLUCOSE 104* 106* 134*  BUN 105* 105* 104*  CREATININE 7.04* 7.06* 7.04*  CALCIUM 9.0 8.6* 8.5*   Liver Function Tests: Recent Labs  Lab 07/05/2022 1459 06/15/22 0346  AST 13* 9*  ALT 11 9  ALKPHOS 63 45  BILITOT 0.9 0.6  PROT 7.3 6.2*  ALBUMIN 3.5 2.8*   Recent Labs  Lab 06/29/2022 1459  LIPASE 30   No results for input(s): "AMMONIA" in the last 168 hours. Coagulation Profile: No results for input(s): "INR", "PROTIME" in the last 168 hours. CBC: Recent Labs  Lab 07/05/2022 1459 06/15/22 0346  WBC 15.6* 14.2*  HGB 12.3* 9.9*  HCT 37.9* 30.8*  MCV 96.7 96.6  PLT 461* 379   Cardiac Enzymes: No results for input(s): "CKTOTAL", "CKMB", "CKMBINDEX", "TROPONINI" in the last 168 hours. BNP: Invalid input(s): "POCBNP" CBG: No  results for input(s): "GLUCAP" in the last 168 hours. HbA1C: No results for input(s): "HGBA1C" in the last 72 hours. Urine analysis:    Component Value Date/Time   COLORURINE YELLOW 07/05/2022 1455   APPEARANCEUR CLOUDY (A) 06/26/2022 1455   LABSPEC 1.011 07/07/2022 1455   PHURINE 5.0 07/06/2022 1455   GLUCOSEU NEGATIVE 07/07/2022 1455   HGBUR LARGE (A) 06/24/2022 1455   BILIRUBINUR NEGATIVE 07/07/2022 1455   KETONESUR 5 (A) 07/08/2022 1455   PROTEINUR 100 (A) 06/16/2022 1455   NITRITE NEGATIVE 06/11/2022 1455   LEUKOCYTESUR MODERATE (A) 07/10/2022 1455   Sepsis Labs: @LABRCNTIP (procalcitonin:4,lacticidven:4) ) Recent Results (from the past 240 hour(s))  Blood culture (routine x 2)     Status: None (Preliminary result)   Collection Time: 07/05/2022  2:59 PM   Specimen: Right Antecubital; Blood  Result Value Ref Range Status   Specimen Description   Final    RIGHT ANTECUBITAL BOTTLES DRAWN AEROBIC AND ANAEROBIC   Special Requests   Final    Blood Culture results may not be optimal due to an excessive volume of blood received in culture bottles Performed at [facility], [address] [place], [place] [address]    Culture PENDING  Incomplete   Report Status PENDING  Incomplete  Blood culture (routine  x 2)     Status: None (Preliminary result)   Collection Time: 07/01/2022  3:12 PM   Specimen: Left Antecubital; Blood  Result Value Ref Range Status   Specimen Description   Final    LEFT ANTECUBITAL BOTTLES DRAWN AEROBIC AND ANAEROBIC   Special Requests   Final    Blood Culture results may not be optimal due to an excessive volume of blood received in culture bottles Performed at [facility], [address] [place], [place] [address]    Culture PENDING  Incomplete   Report Status PENDING  Incomplete     Scheduled Meds:  heparin  5,000 Units Subcutaneous Q12H   ipratropium-albuterol  3 mL Nebulization Q6H   metoprolol tartrate  25 mg Oral BID   Continuous Infusions:   cefTRIAXone (ROCEPHIN)  IV     metronidazole 500 mg (07/10/2022 1948)   sodium bicarbonate 150 mEq in dextrose 5 % 1,150 mL infusion 100 mL/hr at 06/15/22 6045    Procedures/Studies: DG Chest Port 1V same Day  Result Date: 06/15/2022 CLINICAL DATA:  Dyspnea EXAM: PORTABLE CHEST 1 VIEW COMPARISON:  08/15/2019, CT 06/05/2022 FINDINGS: Stable coarsening of the pulmonary interstitium. The lungs are symmetrically well inflated. Known spiculated pulmonary nodule within the peripheral right upper lobe seen on prior CT examination is not well appreciated on this exam. New retrocardiac opacity in keeping with a focal pulmonary infiltrate within this region, possibly infectious in the acute setting. No pneumothorax or pleural effusion. Cardiac size within normal limits. Central pulmonary vascular congestion without overt pulmonary edema. IMPRESSION: 1. New retrocardiac pulmonary infiltrate, possibly infectious in the acute setting. 2. Central pulmonary vascular congestion without overt pulmonary edema. 3. Known right upper lobe spiculated pulmonary nodule not well appreciated on this exam. Electronically Signed   By: Helyn Numbers M.D.   On: 06/15/2022 00:48   CT ABDOMEN PELVIS WO CONTRAST  Result Date: 07/08/2022 CLINICAL DATA:  Abdominal pain, nausea and diarrhea, history of colon cancer EXAM: CT ABDOMEN AND PELVIS WITHOUT CONTRAST TECHNIQUE: Multidetector CT imaging of the abdomen and pelvis was performed following the standard protocol without IV contrast. Unenhanced CT was performed per clinician order. Lack of IV contrast limits sensitivity and specificity, especially for evaluation of abdominal/pelvic solid viscera. RADIATION DOSE REDUCTION: This exam was performed according to the departmental dose-optimization program which includes automated exposure control, adjustment of the mA and/or kV according to patient size and/or use of iterative reconstruction technique. COMPARISON:  05/16/2022 FINDINGS: Lower  chest: New consolidation within the posterior left lower lobe at the costophrenic angle, which could be atelectasis or acute airspace disease. No pleural effusion. Hepatobiliary: Unremarkable unenhanced appearance of the liver. The gallbladder is moderately distended, without evidence of cholelithiasis or cholecystitis. Pancreas: Unremarkable unenhanced appearance. Spleen: Unremarkable unenhanced appearance. Adrenals/Urinary Tract: Bilateral hydronephrosis is again identified, as seen on recent chest CT. There is also bilateral hydroureter to the level of the base of the bladder. There is marked posterior bladder wall thickening, measuring up to 14 mm. High attenuation material seen within the bladder surrounding the Foley catheter balloon consistent with blood products. Differential diagnosis include cystitis, chronic scarring from previous radiation therapy in a patient with a history of colon cancer, or bladder neoplasm. Urology consultation recommended. There are bilateral nonobstructing renal calculi, measuring 5 mm on the left and 3 mm on the right. Simple appearing right renal cysts do not require specific imaging follow-up. The adrenals are unremarkable. Stomach/Bowel: Postsurgical changes are seen from distal colectomy and reanastomosis. There  is segmental wall thickening of the descending colon extending to the level of the anastomosis, compatible with inflammatory or infectious colitis. Moderate distension of the colon with retained gas and stool. No evidence of small-bowel obstruction. Vascular/Lymphatic: Lymphadenopathy has developed within the bilateral inguinal regions and retroperitoneum. Index lymph node in the left inguinal region reference image 76/2 measures 15 mm in short axis. Left para-aortic adenopathy measures up to 14 mm in short axis reference image 43/2. There is extensive atherosclerosis throughout the aorta and its distal branches. Evaluation of the vascular lumen is limited without IV  contrast. Reproductive: The prostate is enlarged, measuring 5.7 x 4.8 cm in cross-sectional diameter. Other: There is diffuse fat stranding in the perirectal region, surrounding the prostate, and adjacent to the wall thickening at the base of the bladder. This is nonspecific, and could be related to prior radiation therapy. There is no free intraperitoneal fluid or free intraperitoneal gas. There are fat containing midline ventral hernias. The periumbilical fat containing ventral hernia on image 53 demonstrates stranding within the herniated fat, could reflect incarceration. No bowel herniation. There is also evidence of prior ventral hernia repair. Musculoskeletal: No acute or destructive bony lesions. Stable severe right hip osteoarthritis. Reconstructed images demonstrate no additional findings. IMPRESSION: 1. Marked wall thickening of the posterior aspect of the bladder, measuring up to 14 mm. This results in severe bilateral hydroureteronephrosis. Differential diagnosis includes neoplasm, post radiation change, or cystitis. High attenuation material surrounding the Foley catheter within the bladder lumen consistent with blood products. Urology consultation and cystoscopy recommended. 2. Segmental wall thickening of the distal colon, extending from the splenic flexure through the rectosigmoid anastomosis, suspicious for underlying inflammatory or infectious colitis. 3. New retroperitoneal and bilateral inguinal lymphadenopathy. Metastatic disease not excluded. 4. New dependent consolidation within the left lower lobe posterior costophrenic angle, consistent with pneumonia or atelectasis. 5. Fat containing midline ventral hernias. The inferior ventral hernia demonstrates stranding within the herniated fat, incarcerated hernia not excluded. No bowel herniation. 6. Enlarged prostate. 7. Nonspecific fat stranding within the lower pelvis surrounding the rectosigmoid anastomosis, prostate, and bladder base. This could  be sequela of prior radiation therapy. 8.  Aortic Atherosclerosis (ICD10-I70.0). 9. Bilateral nonobstructing renal calculi. Electronically Signed   By: Sharlet Salina M.D.   On: 07/07/2022 17:06   CT Chest Wo Contrast  Result Date: 06/09/2022 CLINICAL DATA:  History of colon cancer. Status post chemotherapy and radiation. Lung nodules. EXAM: CT CHEST WITHOUT CONTRAST TECHNIQUE: Multidetector CT imaging of the chest was performed following the standard protocol without IV contrast. RADIATION DOSE REDUCTION: This exam was performed according to the departmental dose-optimization program which includes automated exposure control, adjustment of the mA and/or kV according to patient size and/or use of iterative reconstruction technique. COMPARISON:  CT 09/26/2021 and older FINDINGS: Cardiovascular: Heart is nonenlarged. No pericardial effusion. Coronary artery calcifications are seen. The thoracic aorta has some scattered vascular calcifications. Tortuous course of the descending thoracic aorta. Mediastinum/Nodes: Small hiatal hernia. Normal caliber thoracic esophagus. The esophagus is slightly patulous. Heterogeneous thyroid gland. No specific abnormal lymph node enlargement identified in the axillary region or hilum on this noncontrast examination. There are some small mediastinal nodes, nonpathologic by size criteria. This includes retrocrural. Example series 2, image 132 right retrocrural measures 12 x 9 mm today and previously 12 x 8 mm. Lungs/Pleura: Breathing motion. Dependent atelectasis. No consolidation, pneumothorax or effusion. Upper lung zone centrilobular emphysematous lung changes are identified. Apical pleural thickening on the left-greater-than-right. Spiculated semi-solid nodule seen  in the right upper lobe on the prior that measured 16 mm overall, today on series 2, image 62 again measures 16 by 10 mm, unchanged when measured in the same fashion. The solid component more superior the was measured  at 9 mm is stable. On the study of December 2021, the lesion is remeasured in the same fashion as today and at that time would have measured 15 x 9 mm. Very similar. Left upper lobe 5 mm nodule at the apex is stable today on series 2, image 32. This lesion has been stable since December 2021. Upper Abdomen: In the upper abdomen the adrenal glands are diffusely thickened and nodular. New collecting system dilatation of the kidneys. Etiology is uncertain. Musculoskeletal: Diffuse degenerative changes seen along the spine. IMPRESSION: Bilateral lung nodule seen previously are similar today going back to a study of December 2021. Long-term stability. No specific additional imaging follow-up of the lung nodules. Underlying emphysematous lung changes. However there is new collecting system dilatation of the kidneys at the edge of the imaging field of uncertain etiology. Recommend dedicated abdominal imaging to confirm etiology and correlate with known history. Findings will be called to the ordering service by the Radiology physician assistant team Aortic Atherosclerosis (ICD10-I70.0) and Emphysema (ICD10-J43.9). Electronically Signed   By: Karen Kays M.D.   On: 06/09/2022 15:15    Catarina Hartshorn, DO  [facility]  If 7PM-7AM, please contact night-coverage [email or web address] Password [email or web address] 06/15/2022, 7:58 AM   LOS: 1 day

## 2022-06-15 NOTE — Consult Note (Signed)
Gastroenterology Consult   Referring Provider: Dr. Arbutus Leas  Primary Care Physician:  Verlee Monte, NP Primary Gastroenterologist:  Dr. Marletta Lor, previously unassigned.   Patient ID: [name]; [id number]; October 31, 1935   Admit date: 06/29/2022  LOS: 1 day   Date of Consultation: 06/15/2022  Reason for Consultation:  Colitis   History of Present Illness   [name] is an 87 y.o. year old male with a history of coronary artery disease, hypertension, chronic respiratory failure on 2 L at nighttime, COPD, rectal cancer 2011 s/p chemoradiation and subsequent resection with residual carcinoma noted on path, presenting to the ED yesterday with urinary incontinence, nausea, loose stools, generalized abdominal pain, and distension. He was found to have acute kidney injury, abnormal UA with culture pending, bilateral hydroureteronephrosis, and colitis on CT. GI has been consulted due to colitis. Nephrology and Urology have also been consulted with plans for bilateral nephrostomy tube placement.   In the ED: Creatinine 7.04, potassium 5.6, BUN 105, Hgb 12.3, WBC count 15.6, blood cultures pending, lactic acid 1.0, concern for UTI with culture pending, CT abd/pelvis without contrast: marked wall thickening of posterior aspect of bladder with severe bilateral hydroureteronephrosis, segmental wall thickening of distal colon extending from splenic flexure through rectosigmoid anastomosis, retroperitoneal and bilateral inguinal lymphadenopathy, left lower lobe pneumonia.  Wife states he had started having looser, frequent stools a few weeks ago, progressing in frequency and worsening recently. Scant blood at times. History of hemorrhoids. Lower abdominal pain and chills. Associated nausea. Vomited last night. No recent antibiotics or sick contacts. Patient is primarily home bound with decreased mobility. Able to stand with cane but has been weak and primarily is in bed or chair.   2015 Dr. Gabriel Cirri: normal  colonoscopy    Past Medical History:  Diagnosis Date   Arthritis    knees, hips   CAD (coronary artery disease)    Abnormal stress perfusion study with apical and inferior wall ischemia. LAD had a long 99% stenosis followed by 30% stenosis, first diagonal 25% stenosis, circumflex luminal irregularities, second obtuse marginal 25% stenosis, RCA 40% followed by 40% stenosis. EF 60%. Stenting with 2 bare metal stents by Dr. Excell Seltzer to the LAD.   Colon cancer [facility])    Radiation, chemo, surgery 2011   Complication of anesthesia    Pt states he coughs alot during anesthesia   COPD (chronic obstructive pulmonary disease) (HCC)    Dyspnea    when walking, knee pain    Dysrhythmia    History of kidney stones    passed x1   History of tobacco use    HOH (hard of hearing)    Hypertension    Pneumonia    many times as a child    Past Surgical History:  Procedure Laterality Date   ABDOMINAL AORTOGRAM W/LOWER EXTREMITY N/A 01/09/2019   Procedure: ABDOMINAL AORTOGRAM W/LOWER EXTREMITY;  Surgeon: Maeola Harman, MD;  Location: [facility] INVASIVE CV LAB;  Service: Cardiovascular;  Laterality: N/A;   BRONCHIAL BIOPSY  08/15/2019   Procedure: BRONCHIAL BIOPSIES;  Surgeon: Josephine Igo, DO;  Location: [facility] ENDOSCOPY;  Service: Pulmonary;;   BRONCHIAL BRUSHINGS  08/15/2019   Procedure: BRONCHIAL BRUSHINGS;  Surgeon: Josephine Igo, DO;  Location: [facility] ENDOSCOPY;  Service: Pulmonary;;   BRONCHIAL NEEDLE ASPIRATION BIOPSY  08/15/2019   Procedure: BRONCHIAL NEEDLE ASPIRATION BIOPSIES;  Surgeon: Josephine Igo, DO;  Location: [facility] ENDOSCOPY;  Service: Pulmonary;;   BRONCHIAL WASHINGS  08/15/2019   Procedure: BRONCHIAL WASHINGS;  Surgeon: Josephine Igo, DO;  Location: [facility] ENDOSCOPY;  Service: Pulmonary;;   CATARACT EXTRACTION W/PHACO Left 05/29/2016   Procedure: CATARACT EXTRACTION PHACO AND INTRAOCULAR LENS PLACEMENT (IOC);  Surgeon: Fabio Pierce, MD;  Location: [facility] ORS;  Service: Ophthalmology;   Laterality: Left;  CDE: 10.39   CATARACT EXTRACTION W/PHACO Right 06/12/2016   Procedure: CATARACT EXTRACTION PHACO AND INTRAOCULAR LENS PLACEMENT RIGHT EYE CDE= 13.59;  Surgeon: Fabio Pierce, MD;  Location: [facility] ORS;  Service: Ophthalmology;  Laterality: Right;  right   CORONARY ANGIOPLASTY WITH STENT PLACEMENT  2008   x 2   HEMICOLECTOMY  01/10/2010   INGUINAL HERNIA REPAIR  2012   PORTACATH PLACEMENT  2011   portacath removal  2011   UMBILICAL HERNIA REPAIR  01/2008   left   VIDEO BRONCHOSCOPY WITH ENDOBRONCHIAL NAVIGATION N/A 08/15/2019   Procedure: VIDEO BRONCHOSCOPY WITH ENDOBRONCHIAL NAVIGATION;  Surgeon: Josephine Igo, DO;  Location: [facility] ENDOSCOPY;  Service: Pulmonary;  Laterality: N/A;    Prior to Admission medications   Medication Sig Start Date End Date Taking? Authorizing Provider  acetaminophen (TYLENOL) 500 MG tablet Take 1,000 mg by mouth every 6 (six) hours as needed.   Yes [provider]  albuterol (VENTOLIN HFA) 108 (90 Base) MCG/ACT inhaler Inhale 2 puffs into the lungs every 6 (six) hours as needed for wheezing or shortness of breath. Patient taking differently: Inhale 1 puff into the lungs every 6 (six) hours as needed for wheezing or shortness of breath. 08/29/21  Yes Cobb, Ruby Cola, NP  amLODipine-benazepril (LOTREL) 10-40 MG capsule Take 1 capsule by mouth daily.   Yes [provider]  aspirin 81 MG chewable tablet Chew 81 mg by mouth every evening.   Yes [provider]  atorvastatin (LIPITOR) 20 MG tablet Take 1 tablet (20 mg total) by mouth every evening. 11/22/18  Yes Glade Lloyd, MD  Cholecalciferol (VITAMIN D3) 50 MCG (2000 UT) TABS Take 2,000 Units by mouth daily.   Yes [provider]  Cinnamon 500 MG TABS Take 500 mg by mouth 2 (two) times daily.   Yes [provider]  Cyanocobalamin (VITAMIN B12 PO) Use as directed 1 tablet in the mouth or throat daily.   Yes [provider]  Emollient (CERAVE) CREA  Apply 1 application topically 2 (two) times daily.   Yes [provider]  ferrous sulfate 325 (65 FE) MG tablet Take 325 mg by mouth daily with breakfast.   Yes [provider]  Fluticasone-Umeclidin-Vilant (TRELEGY ELLIPTA) 200-62.5-25 MCG/ACT AEPB Inhale 1 puff into the lungs daily. 08/29/21  Yes Cobb, Ruby Cola, NP  hydrochlorothiazide (HYDRODIURIL) 25 MG tablet Take 25 mg by mouth daily.   Yes [provider]  loperamide (IMODIUM A-D) 2 MG tablet Take 2 mg by mouth 4 (four) times daily as needed for diarrhea or loose stools.   Yes [provider]  Magnesium 250 MG TABS Take 250 mg by mouth daily.    Yes [provider]  metoprolol tartrate (LOPRESSOR) 100 MG tablet Take 1 tablet (100 mg total) by mouth at bedtime. 11/22/18  Yes Glade Lloyd, MD  Multiple Vitamins-Minerals (MENS MULTIPLE VITAMIN/LYCOPENE PO) Take 1 tablet by mouth daily.   Yes [provider]  nitroGLYCERIN (NITROSTAT) 0.4 MG SL tablet Place 1 tablet (0.4 mg total) under the tongue every 5 (five) minutes as needed. 12/27/12  Yes Rollene Rotunda, MD  OVER THE COUNTER MEDICATION Take 1 tablet by mouth 2 (two) times daily. Fish, flax and borage  oil   Yes [provider]  Turmeric (QC TUMERIC COMPLEX PO) Take by mouth 2 (two) times daily.   Yes [provider]  zinc gluconate 50 MG tablet Take 50 mg by mouth daily.   Yes [provider]    Current Facility-Administered Medications  Medication Dose Route Frequency Provider Last Rate Last Admin   acetaminophen (TYLENOL) tablet 650 mg  650 mg Oral Q6H PRN Meredeth Ide, MD       Or   acetaminophen (TYLENOL) suppository 650 mg  650 mg Rectal Q6H PRN Sharl Ma, Sarina Ill, MD       cefTRIAXone (ROCEPHIN) 1 g in sodium chloride 0.9 % 100 mL IVPB  1 g Intravenous Q24H Sharl Ma, Gagan S, MD       heparin injection 5,000 Units  5,000 Units Subcutaneous Q12H Meredeth Ide, MD   5,000 Units at 06/12/2022 2211    ipratropium-albuterol (DUONEB) 0.5-2.5 (3) MG/3ML nebulizer solution 3 mL  3 mL Nebulization Q6H Meredeth Ide, MD   3 mL at 06/15/22 0730   metoprolol tartrate (LOPRESSOR) tablet 25 mg  25 mg Oral BID Meredeth Ide, MD   25 mg at 06/21/2022 2211   metroNIDAZOLE (FLAGYL) IVPB 500 mg  500 mg Intravenous Q12H Meredeth Ide, MD 100 mL/hr at 07/05/2022 1948 500 mg at 07/01/2022 1948   ondansetron (ZOFRAN) tablet 4 mg  4 mg Oral Q6H PRN Meredeth Ide, MD       Or   ondansetron [facility]) injection 4 mg  4 mg Intravenous Q6H PRN Meredeth Ide, MD        Allergies as of 06/24/2022 - Review Complete 07/06/2022  Allergen Reaction Noted   Other  08/14/2019   Sulfa antibiotics  05/23/2014    Family History  Adopted: Yes    Social History   Socioeconomic History   Marital status: Married    Spouse name: Not on file   Number of children: Not on file   Years of education: Not on file   Highest education level: Not on file  Occupational History   Occupation: Retired  Tobacco Use   Smoking status: Former    Packs/day: 3.00    Years: 57.00    Additional pack years: 0.00    Total pack years: 171.00    Types: Cigarettes    Quit date: 2006    Years since quitting: 18.3    Passive exposure: Past   Smokeless tobacco: Never   Tobacco comments:    quit smoking 2006  Vaping Use   Vaping Use: Never used  Substance and Sexual Activity   Alcohol use: No   Drug use: No   Sexual activity: Yes  Other Topics Concern   Not on file  Social History Narrative   Married with 3 children and 2 grandchildren   Social Determinants of Health   Financial Resource Strain: Not on file  Food Insecurity: Not on file  Transportation Needs: Not on file  Physical Activity: Not on file  Stress: Not on file  Social Connections: Not on file  Intimate Partner Violence: Not on file     Review of Systems   Gen: see HPI CV: Denies chest pain, heart palpitations, syncope, edema  Resp: Denies shortness of breath  with rest, cough, wheezing, coughing up blood, and pleurisy. GI: see HPI GU : Denies urinary burning, blood in urine, urinary frequency, and urinary incontinence. MS: see HPI Derm: Denies rash, itching, dry skin, hives. Psych: Denies  depression, anxiety, memory loss, hallucinations, and confusion. Heme: Denies bruising or bleeding Neuro:  Denies any headaches, dizziness, paresthesias, shaking  Physical Exam   Vital Signs in last 24 hours: Temp:  [98.1 F (36.7 C)-99.2 F (37.3 C)] 98.7 F (37.1 C) (05/06 0726) Pulse Rate:  [81-94] 81 (05/06 0726) Resp:  [14-32] 14 (05/06 0726) BP: (113-156)/(59-78) 141/72 (05/06 0726) SpO2:  [93 %-97 %] 97 % (05/06 0730) FiO2 (%):  [28 %] 28 % (05/05 2022) Weight:  [113.4 kg] 113.4 kg (05/05 1425) Last BM Date : 07/01/2022  General:   Alert,  Well-developed, well-nourished, pleasant and cooperative in NAD Head:  Normocephalic and atraumatic. Eyes:  Sclera clear, no icterus.    Ears:  Normal auditory acuity. Lungs:  Clear throughout to auscultation.   Heart:  S1 S2 present without murmurs Abdomen:  generalized anasarca, distended but soft, TTP lower abdomen, midline scar from prior hernia repair with erythema to right of scar. Bowel sounds present Rectal: deferred   Extremities:  bilateral lower extremity edema 2+, left greater than right, cellulitis LLE Neurologic:  Alert and  oriented x4. Psych:  Alert and cooperative. Normal mood and affect.  Intake/Output from previous day: 05/05 0701 - 05/06 0700 In: 1702.8 [P.O.:120; I.V.:464.5; IV Piggyback:1118.3] Out: 400 [Urine:400] Intake/Output this shift: Total I/O In: 460 [I.V.:460] Out: -     Labs/Studies   Recent Labs Recent Labs    07/01/2022 1459 06/15/22 0346  WBC 15.6* 14.2*  HGB 12.3* 9.9*  HCT 37.9* 30.8*  PLT 461* 379   BMET Recent Labs    07/01/2022 1459  1846 06/15/22 0346  NA 139 138 139  K 5.6* 5.7* 4.8  CL 107 108 108  CO2 15* 13* 16*  GLUCOSE 104* 106*  134*  BUN 105* 105* 104*  CREATININE 7.04* 7.06* 7.04*  CALCIUM 9.0 8.6* 8.5*   LFT Recent Labs    07/08/2022 1459 06/15/22 0346  PROT 7.3 6.2*  ALBUMIN 3.5 2.8*  AST 13* 9*  ALT 11 9  ALKPHOS 63 45  BILITOT 0.9 0.6    Radiology/Studies DG Chest Port 1V same Day  Result Date: 06/15/2022 CLINICAL DATA:  Dyspnea EXAM: PORTABLE CHEST 1 VIEW COMPARISON:  08/15/2019, CT 06/05/2022 FINDINGS: Stable coarsening of the pulmonary interstitium. The lungs are symmetrically well inflated. Known spiculated pulmonary nodule within the peripheral right upper lobe seen on prior CT examination is not well appreciated on this exam. New retrocardiac opacity in keeping with a focal pulmonary infiltrate within this region, possibly infectious in the acute setting. No pneumothorax or pleural effusion. Cardiac size within normal limits. Central pulmonary vascular congestion without overt pulmonary edema. IMPRESSION: 1. New retrocardiac pulmonary infiltrate, possibly infectious in the acute setting. 2. Central pulmonary vascular congestion without overt pulmonary edema. 3. Known right upper lobe spiculated pulmonary nodule not well appreciated on this exam. Electronically Signed   By: Helyn Numbers M.D.   On: 06/15/2022 00:48   CT ABDOMEN PELVIS WO CONTRAST  Result Date: 06/30/2022 CLINICAL DATA:  Abdominal pain, nausea and diarrhea, history of colon cancer EXAM: CT ABDOMEN AND PELVIS WITHOUT CONTRAST TECHNIQUE: Multidetector CT imaging of the abdomen and pelvis was performed following the standard protocol without IV contrast. Unenhanced CT was performed per clinician order. Lack of IV contrast limits sensitivity and specificity, especially for evaluation of abdominal/pelvic solid viscera. RADIATION DOSE REDUCTION: This exam was performed according to the departmental dose-optimization program which includes automated exposure control, adjustment of the mA and/or kV according to patient  size and/or use of iterative  reconstruction technique. COMPARISON:  05/16/2022 FINDINGS: Lower chest: New consolidation within the posterior left lower lobe at the costophrenic angle, which could be atelectasis or acute airspace disease. No pleural effusion. Hepatobiliary: Unremarkable unenhanced appearance of the liver. The gallbladder is moderately distended, without evidence of cholelithiasis or cholecystitis. Pancreas: Unremarkable unenhanced appearance. Spleen: Unremarkable unenhanced appearance. Adrenals/Urinary Tract: Bilateral hydronephrosis is again identified, as seen on recent chest CT. There is also bilateral hydroureter to the level of the base of the bladder. There is marked posterior bladder wall thickening, measuring up to 14 mm. High attenuation material seen within the bladder surrounding the Foley catheter balloon consistent with blood products. Differential diagnosis include cystitis, chronic scarring from previous radiation therapy in a patient with a history of colon cancer, or bladder neoplasm. Urology consultation recommended. There are bilateral nonobstructing renal calculi, measuring 5 mm on the left and 3 mm on the right. Simple appearing right renal cysts do not require specific imaging follow-up. The adrenals are unremarkable. Stomach/Bowel: Postsurgical changes are seen from distal colectomy and reanastomosis. There is segmental wall thickening of the descending colon extending to the level of the anastomosis, compatible with inflammatory or infectious colitis. Moderate distension of the colon with retained gas and stool. No evidence of small-bowel obstruction. Vascular/Lymphatic: Lymphadenopathy has developed within the bilateral inguinal regions and retroperitoneum. Index lymph node in the left inguinal region reference image 76/2 measures 15 mm in short axis. Left para-aortic adenopathy measures up to 14 mm in short axis reference image 43/2. There is extensive atherosclerosis throughout the aorta and its distal  branches. Evaluation of the vascular lumen is limited without IV contrast. Reproductive: The prostate is enlarged, measuring 5.7 x 4.8 cm in cross-sectional diameter. Other: There is diffuse fat stranding in the perirectal region, surrounding the prostate, and adjacent to the wall thickening at the base of the bladder. This is nonspecific, and could be related to prior radiation therapy. There is no free intraperitoneal fluid or free intraperitoneal gas. There are fat containing midline ventral hernias. The periumbilical fat containing ventral hernia on image 53 demonstrates stranding within the herniated fat, could reflect incarceration. No bowel herniation. There is also evidence of prior ventral hernia repair. Musculoskeletal: No acute or destructive bony lesions. Stable severe right hip osteoarthritis. Reconstructed images demonstrate no additional findings. IMPRESSION: 1. Marked wall thickening of the posterior aspect of the bladder, measuring up to 14 mm. This results in severe bilateral hydroureteronephrosis. Differential diagnosis includes neoplasm, post radiation change, or cystitis. High attenuation material surrounding the Foley catheter within the bladder lumen consistent with blood products. Urology consultation and cystoscopy recommended. 2. Segmental wall thickening of the distal colon, extending from the splenic flexure through the rectosigmoid anastomosis, suspicious for underlying inflammatory or infectious colitis. 3. New retroperitoneal and bilateral inguinal lymphadenopathy. Metastatic disease not excluded. 4. New dependent consolidation within the left lower lobe posterior costophrenic angle, consistent with pneumonia or atelectasis. 5. Fat containing midline ventral hernias. The inferior ventral hernia demonstrates stranding within the herniated fat, incarcerated hernia not excluded. No bowel herniation. 6. Enlarged prostate. 7. Nonspecific fat stranding within the lower pelvis surrounding the  rectosigmoid anastomosis, prostate, and bladder base. This could be sequela of prior radiation therapy. 8.  Aortic Atherosclerosis (ICD10-I70.0). 9. Bilateral nonobstructing renal calculi. Electronically Signed   By: Sharlet Salina M.D.   On: 07/03/2022 17:06     Assessment   [name] is an 87 y.o. year old male with a history  of coronary artery disease, hypertension, chronic respiratory failure on 2 L at nighttime, COPD, rectal cancer 2011 s/p chemoradiation and subsequent resection with residual carcinoma noted on path, presenting to the ED yesterday with urinary incontinence, nausea, loose stools, generalized abdominal pain, and distension. He was admitted with AKI,  bilateral hydroureteronephrosis, and colitis on CT with GI consultation requested.   Colitis: noted to have 4-5 loose stools daily over past few weeks without recent exposure to antibiotics or sick contacts. GI path and Cdiff have been requested but not collected. Recommend empiric antibiotics, complete stool studies if able. With history of colorectal cancer in 2011 and last colonoscopy in 2015, recommend elective colonoscopy as outpatient once over acute illness.   Severe hydroureteronephrosis with AKI and concern for bladder neoplasm: Urology and Nephrology on board with plans for bilateral nephrostomy tube placement.      Plan / Recommendations    Stool studies Continue empiric antibiotics Recommend elective outpatient colonoscopy once acute illness resolved     06/15/2022, 8:21 AM  Gelene Mink, PhD, [facility]

## 2022-06-16 ENCOUNTER — Inpatient Hospital Stay (HOSPITAL_COMMUNITY): Payer: Medicare Other

## 2022-06-16 ENCOUNTER — Telehealth: Payer: Self-pay | Admitting: Gastroenterology

## 2022-06-16 DIAGNOSIS — N1339 Other hydronephrosis: Secondary | ICD-10-CM | POA: Diagnosis not present

## 2022-06-16 DIAGNOSIS — R1084 Generalized abdominal pain: Secondary | ICD-10-CM | POA: Diagnosis not present

## 2022-06-16 DIAGNOSIS — R197 Diarrhea, unspecified: Secondary | ICD-10-CM | POA: Diagnosis not present

## 2022-06-16 DIAGNOSIS — L03116 Cellulitis of left lower limb: Secondary | ICD-10-CM | POA: Diagnosis not present

## 2022-06-16 DIAGNOSIS — I5031 Acute diastolic (congestive) heart failure: Secondary | ICD-10-CM | POA: Diagnosis not present

## 2022-06-16 DIAGNOSIS — D485 Neoplasm of uncertain behavior of skin: Secondary | ICD-10-CM | POA: Diagnosis not present

## 2022-06-16 DIAGNOSIS — N179 Acute kidney failure, unspecified: Secondary | ICD-10-CM | POA: Diagnosis not present

## 2022-06-16 DIAGNOSIS — J9621 Acute and chronic respiratory failure with hypoxia: Secondary | ICD-10-CM | POA: Diagnosis not present

## 2022-06-16 DIAGNOSIS — K432 Incisional hernia without obstruction or gangrene: Secondary | ICD-10-CM | POA: Diagnosis not present

## 2022-06-16 LAB — CBC
HCT: 29.2 % — ABNORMAL LOW (ref 39.0–52.0)
Hemoglobin: 9.5 g/dL — ABNORMAL LOW (ref 13.0–17.0)
MCH: 31.4 pg (ref 26.0–34.0)
MCHC: 32.5 g/dL (ref 30.0–36.0)
MCV: 96.4 fL (ref 80.0–100.0)
Platelets: 383 10*3/uL (ref 150–400)
RBC: 3.03 MIL/uL — ABNORMAL LOW (ref 4.22–5.81)
RDW: 14.4 % (ref 11.5–15.5)
WBC: 13.1 10*3/uL — ABNORMAL HIGH (ref 4.0–10.5)
nRBC: 0 % (ref 0.0–0.2)

## 2022-06-16 LAB — CULTURE, BLOOD (ROUTINE X 2)

## 2022-06-16 LAB — ECHOCARDIOGRAM COMPLETE
AR max vel: 2.95 cm2
AV Area VTI: 2.99 cm2
AV Area mean vel: 2.7 cm2
AV Mean grad: 9 mmHg
AV Peak grad: 17.6 mmHg
Ao pk vel: 2.1 m/s
Area-P 1/2: 2.84 cm2
Height: 74 in
S' Lateral: 3 cm
Weight: 4000 oz

## 2022-06-16 LAB — RENAL FUNCTION PANEL
Albumin: 2.7 g/dL — ABNORMAL LOW (ref 3.5–5.0)
Anion gap: 14 (ref 5–15)
BUN: 97 mg/dL — ABNORMAL HIGH (ref 8–23)
CO2: 20 mmol/L — ABNORMAL LOW (ref 22–32)
Calcium: 8.4 mg/dL — ABNORMAL LOW (ref 8.9–10.3)
Chloride: 108 mmol/L (ref 98–111)
Creatinine, Ser: 6.77 mg/dL — ABNORMAL HIGH (ref 0.61–1.24)
GFR, Estimated: 7 mL/min — ABNORMAL LOW (ref >60)
Glucose, Bld: 101 mg/dL — ABNORMAL HIGH (ref 70–99)
Phosphorus: 7.6 mg/dL — ABNORMAL HIGH (ref 2.5–4.6)
Potassium: 4.4 mmol/L (ref 3.5–5.1)
Sodium: 142 mmol/L (ref 135–145)

## 2022-06-16 LAB — GASTROINTESTINAL PANEL BY PCR, STOOL (REPLACES STOOL CULTURE)

## 2022-06-16 MED ORDER — IPRATROPIUM-ALBUTEROL 0.5-2.5 (3) MG/3ML IN SOLN
3.0000 mL | Freq: Three times a day (TID) | RESPIRATORY_TRACT | Status: DC
  Administered 2022-06-16 – 2022-06-17 (×4): 3 mL via RESPIRATORY_TRACT
  Filled 2022-06-16 (×4): qty 3

## 2022-06-16 MED ORDER — PERFLUTREN LIPID MICROSPHERE
1.0000 mL | INTRAVENOUS | Status: AC | PRN
  Administered 2022-06-16: 3 mL via INTRAVENOUS

## 2022-06-16 NOTE — Progress Notes (Signed)
Admit: 06/25/2022 LOS: 2  74M AKI and hyperkalemia 2/2 urinary obstruction in the posterior bladder causing severe hydroureteronephrosis.  Very concerning for bladder cancer.   Subjective:  Received percutaneous nephrostomy tubes yesterday with interventional radiology 3.6 L of urine output after that, bloody; right side seems to be draining more than left Slight improvement in creatinine and BUN, K4.4 Feeling somewhat better Remains on ceftriaxone and metronidazole with concern for infectious colitis, GI and surgery notes reviewed.  05/06 0701 - 05/07 0700 In: 1160 [P.O.:600; I.V.:460; IV Piggyback:100] Out: 3600 [Urine:3600]  Filed Weights   07/10/2022 1425  Weight: 113.4 kg    Scheduled Meds:  Chlorhexidine Gluconate Cloth  6 each Topical Daily   Gerhardt's butt cream   Topical BID   heparin  5,000 Units Subcutaneous Q12H   ipratropium-albuterol  3 mL Nebulization Q6H   metoprolol tartrate  25 mg Oral BID   Continuous Infusions:  cefTRIAXone (ROCEPHIN)  IV     cefTRIAXone (ROCEPHIN)  IV     metronidazole 500 mg (06/16/22 0854)   PRN Meds:.acetaminophen **OR** acetaminophen, ondansetron **OR** ondansetron (ZOFRAN) IV  Current Labs: reviewed    Physical Exam:  Blood pressure (!) 144/75, pulse 69, temperature 97.9 F (36.6 C), resp. rate 20, height 6\' 2"  (1.88 m), weight 113.4 kg, SpO2 98 %. GEN: NAD, on stretcher, ENT: Hard of hearing EYES: EOMI CV: Regular, normal S1 and S2 PULM: Diminished in the bases, normal work of breathing ABD: Distended, mildly tender throughout, quiescent bowel sounds SKIN: Left lower extremity with some erythema and streaking. EXT: 4+ edema bilaterally in the legs  A AKI postrenal/obstructive as below. Nonoliguric now S/p b/l PCNs 06/15/22 with IR Some improvement in SCr / BUN This AM Hyperkalemia, improved, stable Metabolic acidosis secondary to #1 with mildly increased anion gap; improved Posterior bladder wall thickening/mass with  hematuric urine and resultant hydroureteronephrosis; per urology; suspicious for malignancy CAD Hypertension off home blood pressure medications; blood pressure stable Anasarca/lower extremity edema with concern for secondary cellulitis Question colitis or changes related to treatment of bladder cancer in 2011 on ceftriaxone and metronidazole Mild leukocytosis with no documented fevers  P Anticipate further recovery of GFR but degree of recovery and timing is unclear No indications for dialysis We will follow along Urology following for definitive diagnosis Medication Issues; Preferred narcotic agents for pain control are hydromorphone, fentanyl, and methadone. Morphine should not be used.  Baclofen should be avoided Avoid oral sodium phosphate and magnesium citrate based laxatives / bowel preps    Sabra Heck MD 06/16/2022, 9:36 AM  Recent Labs  Lab 06/15/2022 1846 06/15/22 0346 06/16/22 0412  NA 138 139 142  K 5.7* 4.8 4.4  CL 108 108 108  CO2 13* 16* 20*  GLUCOSE 106* 134* 101*  BUN 105* 104* 97*  CREATININE 7.06* 7.04* 6.77*  CALCIUM 8.6* 8.5* 8.4*  PHOS  --   --  7.6*   Recent Labs  Lab 07/01/2022 1459 06/15/22 0346 06/16/22 0412  WBC 15.6* 14.2* 13.1*  HGB 12.3* 9.9* 9.5*  HCT 37.9* 30.8* 29.2*  MCV 96.7 96.6 96.4  PLT 461* 379 383

## 2022-06-16 NOTE — Progress Notes (Signed)
[facility]  Came by to see after his nephrostomy tubes. Looks like he is feeling better. Discussed long list of issues he has going on including the bladder mass, inguinal adenopathy, possible colitis. Discussed that the skin lesion on the leg can be biopsied in the future as an outpatient but this is very far in the future and way down on the priority list.  I would hold on any biopsy now given the location and him being incontinent of stool from weakness and immobility right now.   He and his wife agree.  BP (!) 144/75   Pulse 69   Temp 97.9 F (36.6 C)   Resp 20   Ht 6\' 2"  (1.88 m)   Wt 113.4 kg   SpO2 98%   BMI 32.10 kg/m   Abdomen less distended, soft, thickened skin to the right of midline with chronic fibrotic feel, minor chronic erythema but no consistent with any cellulitis over the area   Patient with multiple issues going on including concern for bladder mass causing severe hydroureteronephrosis and inguinal adenopathy. Abnormal skin lesion on left upper thigh can be biopsied as an outpatient in the future.   Will have patient and wife follow up once he is improving and has plans for his other issues.   Algis Greenhouse, MD [facility] [address] [place], [place] [address] [phone] (office)

## 2022-06-16 NOTE — Progress Notes (Signed)
PROGRESS NOTE  [name]:[id number] DOB: [birth year] DOA: 07/05/2022 PCP: Verlee Monte, NP  Brief History:  87 year old male with a history of coronary artery disease, hypertension, chronic respiratory failure on 2 L at nighttime, COPD, colorectal adenocarcinoma (09/2009) status post partial colectomy (01/2010)  and chemoradiation with Xeloda.  He finished radiation 12/04/2009.  He had follow-up colonoscopy 12/2013 which was negative.  His CEA tumor markers have been normal.  He states that he has not followed up with oncology in over 5 years at this point. Nevertheless, the patient has been complaining of 2 to 3-week history of abdominal discomfort and distention with difficulty urinating.  He feels like he has incomplete bladder emptying.  He has had some nausea without emesis.  He denies any hematuria or dysuria.  He has had some urinary incontinence.  Regarding his abdominal discomfort, the patient states that it is generalized.  He has had loose stools on a daily basis for the last 2 to 3 weeks.  There is no hematochezia or melena.  He denies any new medications.  He denies any recent travels or exotic foods.  He denies any fevers, chills, chest pain, shortness breath, cough, hemoptysis.  The patient denies any NSAIDs.  In the ED, the patient had low-grade temperature of 99.2 F.  He was hemodynamically stable.  Oxygen saturation was 95% on 2 L.  WBC 15.6, hemoglobin 12.3, platelets 279,000.  LFTs were unremarkable.  Sodium 139, potassium 5.6, bicarbonate 15, serum creatinine 7.04.  UA showed 21-50 WBC. CT of the abdomen pelvis showed LLL consolidation, bilateral hydronephrosis which was already seen on a previous CT chest 06/05/2022, posterior wall bladder thickening with high attenuation material around the Foley catheter, wall thickening @descending  colon at the level of the rectosigmoid.  There is diffuse stranding in the perirectal region surrounding the prostate and bladder.   There was bilateral inguinal and retroperitoneal lymphadenopathy. The patient was admitted for further evaluation and treatment of his acute kidney injury and colitis.  A Foley catheter was inserted in the emergency department. Renal function did not improve, so nephrology and urology were consulted.     Assessment/Plan: AKI -Last serum creatinine 0.80 on 08/15/2019 -secondary to obstructive uropathy -Obtain renal ultrasound--persistent hydronephrosis -Bilateral hydronephrosis seen on CT abdomen -No improvement in serum creatinine after Foley insertion -Nephrology consult appreciated -Accurate I's and O's -Appears hypervolemic>>echo -5/7 Echo--EF 55-60, no WMA, G1DD, normal RVF -06/15/22--bilateral nephrostomy tubes placed   Hyperkalemia -Patient was started on bicarbonate drip -Given Lokelma -improved   Colitis -Descending colon and perirectal thickening as discussed above -GI consult -Stool pathogen panel--neg -Stool for C. Difficile-neg -Continue empiric ceftriaxone and metronidazole   Bilateral hydronephrosis/Bladder mass -Foley inserted--did not resolve -due to bladder mass causing obstructive uropathy -Renal ultrasound post Foley insertion--persistent hydronephrosis -06/15/22--bilateral nephrostomy tubes placed -Dr. Ronne Binning following>>will ultimately need cystoscopy  Lobar pneumonia -5/5 CT abd--shows LLL consolidation -continue ceftriaxone   Left leg cellulitis -Appears streptococcal -Venous duplex-neg -Continue ceftriaxone -improving   Essential hypertension -Restart metoprolol at lower dose -Hold HCTZ -Hold benazepril -Holding amlodipine temporarily -BP remains controlled   Acute on chronic respiratory failure with hypoxia -Chronically on 2 L at nighttime -Secondary to pulmonary edema and pneumonia -Discontinue bicarbonate drip   COPD -Continue bronchodilators   Coronary artery disease -No chest pain presently -Continue metoprolol   Mixed  hyperlipidemia -Continue statin   Obesity -BMI 32.10 -lifestyle modification  Family Communication:  no Family at bedside   Consultants:  renal, urology, GI   Code Status:  FULL   DVT Prophylaxis:  [place] Heparin     Procedures: As Listed in Progress Note Above   Antibiotics: Ceftriaxone 5/5>> Metronidazole 5/5>>            Subjective: Patient denies fevers, chills, headache, chest pain, dyspnea, nausea, vomiting, diarrhea, abdominal pain, dysuria, hematuria, hematochezia, and melena.   Objective: Vitals:   06/16/22 0507 06/16/22 0737 06/16/22 1300 06/16/22 1357  BP: (!) 144/75  124/71   Pulse: 79 69 75 72  Resp: 18 20 18 17   Temp: 97.9 F (36.6 C)  98.6 F (37 C)   TempSrc:   Oral   SpO2: 97% 98% 93%   Weight:      Height:        Intake/Output Summary (Last 24 hours) at 06/16/2022 1734 Last data filed at 06/16/2022 1500 Gross per 24 hour  Intake 1073.21 ml  Output 4900 ml  Net -3826.79 ml   Weight change:  Exam:  General:  Pt is alert, follows commands appropriately, not in acute distress HEENT: No icterus, No thrush, No neck mass, [place]/AT Cardiovascular: RRR, S1/S2, no rubs, no gallops Respiratory: bibasilar rales.  No wheeze Abdomen: Soft/+BS, non tender, non distended, no guarding Extremities: No edema, No lymphangitis, No petechiae, No rashes, no synovitis   Data Reviewed: I have personally reviewed following labs and imaging studies Basic Metabolic Panel: Recent Labs  Lab 06/15/2022 1459 06/13/2022 1846 06/15/22 0346 06/16/22 0412  NA 139 138 139 142  K 5.6* 5.7* 4.8 4.4  CL 107 108 108 108  CO2 15* 13* 16* 20*  GLUCOSE 104* 106* 134* 101*  BUN 105* 105* 104* 97*  CREATININE 7.04* 7.06* 7.04* 6.77*  CALCIUM 9.0 8.6* 8.5* 8.4*  PHOS  --   --   --  7.6*   Liver Function Tests: Recent Labs  Lab 06/13/2022 1459 06/15/22 0346 06/16/22 0412  AST 13* 9*  --   ALT 11 9  --   ALKPHOS 63 45  --   BILITOT 0.9 0.6  --    PROT 7.3 6.2*  --   ALBUMIN 3.5 2.8* 2.7*   Recent Labs  Lab 07/02/2022 1459  LIPASE 30   No results for input(s): "AMMONIA" in the last 168 hours. Coagulation Profile: Recent Labs  Lab 06/15/22 1352  INR 1.2   CBC: Recent Labs  Lab 06/11/2022 1459 06/15/22 0346 06/16/22 0412  WBC 15.6* 14.2* 13.1*  HGB 12.3* 9.9* 9.5*  HCT 37.9* 30.8* 29.2*  MCV 96.7 96.6 96.4  PLT 461* 379 383   Cardiac Enzymes: No results for input(s): "CKTOTAL", "CKMB", "CKMBINDEX", "TROPONINI" in the last 168 hours. BNP: Invalid input(s): "POCBNP" CBG: No results for input(s): "GLUCAP" in the last 168 hours. HbA1C: No results for input(s): "HGBA1C" in the last 72 hours. Urine analysis:    Component Value Date/Time   COLORURINE YELLOW 06/17/2022 1455   APPEARANCEUR CLOUDY (A) 07/08/2022 1455   LABSPEC 1.011 07/07/2022 1455   PHURINE 5.0 06/13/2022 1455   GLUCOSEU NEGATIVE 06/18/2022 1455   HGBUR LARGE (A) 06/11/2022 1455   BILIRUBINUR NEGATIVE 07/01/2022 1455   KETONESUR 5 (A) 06/20/2022 1455   PROTEINUR 100 (A) 07/05/2022 1455   NITRITE NEGATIVE 06/23/2022 1455   LEUKOCYTESUR MODERATE (A) 07/09/2022 1455   Sepsis Labs: @LABRCNTIP (procalcitonin:4,lacticidven:4) ) Recent Results (from the past 240 hour(s))  Blood culture (routine x 2)     Status: None (  Preliminary result)   Collection Time: 07/03/2022  2:59 PM   Specimen: Right Antecubital; Blood  Result Value Ref Range Status   Specimen Description   Final    RIGHT ANTECUBITAL BOTTLES DRAWN AEROBIC AND ANAEROBIC   Special Requests   Final    Blood Culture results may not be optimal due to an excessive volume of blood received in culture bottles   Culture   Final    NO GROWTH 2 DAYS Performed at [facility], [address] [place], [place] [address]    Report Status PENDING  Incomplete  Blood culture (routine x 2)     Status: None (Preliminary result)   Collection Time: 06/17/2022  3:12 PM   Specimen: Left Antecubital; Blood  Result  Value Ref Range Status   Specimen Description   Final    LEFT ANTECUBITAL BOTTLES DRAWN AEROBIC AND ANAEROBIC Performed at [facility], [address], [place], [place] [address]    Special Requests   Final    Blood Culture results may not be optimal due to an excessive volume of blood received in culture bottles Performed at [facility], [address] [place], [place] [address]    Culture  Setup Time   Final    GRAM POSITIVE COCCI Gram Stain Report Called to,Read Back By and Verified With: M. CATES LAND @ 06/15/22 ANAEROBIC BOTTLE ONLY CRITICAL RESULT CALLED TO, READ BACK BY AND VERIFIED WITH: PHARMD LAURIE POOLE ON 06/15/22 @ 1715 BY DRT    Culture   Final    GRAM POSITIVE COCCI IDENTIFICATION TO FOLLOW Performed at [facility], [address] [place], [place] [address]    Report Status PENDING  Incomplete  Blood Culture ID Panel (Reflexed)     Status: None   Collection Time: 06/12/2022  3:12 PM  Result Value Ref Range Status   Enterococcus faecalis NOT DETECTED NOT DETECTED Final   Enterococcus Faecium NOT DETECTED NOT DETECTED Final   Listeria monocytogenes NOT DETECTED NOT DETECTED Final   Staphylococcus species NOT DETECTED NOT DETECTED Final   Staphylococcus aureus (BCID) NOT DETECTED NOT DETECTED Final   Staphylococcus epidermidis NOT DETECTED NOT DETECTED Final   Staphylococcus lugdunensis NOT DETECTED NOT DETECTED Final   Streptococcus species NOT DETECTED NOT DETECTED Final   Streptococcus agalactiae NOT DETECTED NOT DETECTED Final   Streptococcus pneumoniae NOT DETECTED NOT DETECTED Final   Streptococcus pyogenes NOT DETECTED NOT DETECTED Final   A.calcoaceticus-baumannii NOT DETECTED NOT DETECTED Final   Bacteroides fragilis NOT DETECTED NOT DETECTED Final   Enterobacterales NOT DETECTED NOT DETECTED Final   Enterobacter cloacae complex NOT DETECTED NOT DETECTED Final   Escherichia coli NOT DETECTED NOT DETECTED Final   Klebsiella aerogenes NOT DETECTED NOT  DETECTED Final   Klebsiella oxytoca NOT DETECTED NOT DETECTED Final   Klebsiella pneumoniae NOT DETECTED NOT DETECTED Final   Proteus species NOT DETECTED NOT DETECTED Final   Salmonella species NOT DETECTED NOT DETECTED Final   Serratia marcescens NOT DETECTED NOT DETECTED Final   Haemophilus influenzae NOT DETECTED NOT DETECTED Final   Neisseria meningitidis NOT DETECTED NOT DETECTED Final   Pseudomonas aeruginosa NOT DETECTED NOT DETECTED Final   Stenotrophomonas maltophilia NOT DETECTED NOT DETECTED Final   Candida albicans NOT DETECTED NOT DETECTED Final   Candida auris NOT DETECTED NOT DETECTED Final   Candida glabrata NOT DETECTED NOT DETECTED Final   Candida krusei NOT DETECTED NOT DETECTED Final   Candida parapsilosis NOT DETECTED NOT DETECTED Final  Candida tropicalis NOT DETECTED NOT DETECTED Final   Cryptococcus neoformans/gattii NOT DETECTED NOT DETECTED Final    Comment: Performed at [facility], [address], [place], [place] [address]  Urine Culture     Status: Abnormal   Collection Time: 06/27/2022  4:05 PM   Specimen: Urine, Clean Catch  Result Value Ref Range Status   Specimen Description   Final    URINE, CLEAN CATCH Performed at [facility], [address] [place], [place] [address]    Special Requests   Final    NONE Performed at [facility], [address], [place], [place] [address]    Culture MULTIPLE SPECIES PRESENT, SUGGEST RECOLLECTION (A)  Final   Report Status 06/15/2022 FINAL  Final  Gastrointestinal Panel by PCR , Stool     Status: None   Collection Time: 06/15/22  2:14 PM   Specimen: STOOL  Result Value Ref Range Status   Campylobacter species NOT DETECTED NOT DETECTED Final   Plesimonas shigelloides NOT DETECTED NOT DETECTED Final   Salmonella species NOT DETECTED NOT DETECTED Final   Yersinia enterocolitica NOT DETECTED NOT DETECTED Final   Vibrio species NOT DETECTED NOT DETECTED Final   Vibrio cholerae NOT DETECTED NOT DETECTED  Final   Enteroaggregative E coli (EAEC) NOT DETECTED NOT DETECTED Final   Enteropathogenic E coli (EPEC) NOT DETECTED NOT DETECTED Final   Enterotoxigenic E coli (ETEC) NOT DETECTED NOT DETECTED Final   Shiga like toxin producing E coli (STEC) NOT DETECTED NOT DETECTED Final   Shigella/Enteroinvasive E coli (EIEC) NOT DETECTED NOT DETECTED Final   Cryptosporidium NOT DETECTED NOT DETECTED Final   Cyclospora cayetanensis NOT DETECTED NOT DETECTED Final   Entamoeba histolytica NOT DETECTED NOT DETECTED Final   Giardia lamblia NOT DETECTED NOT DETECTED Final   Adenovirus F40/41 NOT DETECTED NOT DETECTED Final   Astrovirus NOT DETECTED NOT DETECTED Final   Norovirus GI/GII NOT DETECTED NOT DETECTED Final   Rotavirus A NOT DETECTED NOT DETECTED Final   Sapovirus (I, II, IV, and V) NOT DETECTED NOT DETECTED Final    Comment: Performed at [facility], [address] [place], [place] [address]  C Difficile Quick Screen w PCR reflex     Status: None   Collection Time: 06/15/22  2:14 PM   Specimen: STOOL  Result Value Ref Range Status   C Diff antigen NEGATIVE NEGATIVE Final   C Diff toxin NEGATIVE NEGATIVE Final   C Diff interpretation No C. difficile detected.  Final    Comment: Performed at [facility], [address], [place], [place] [address]     Scheduled Meds:  Chlorhexidine Gluconate Cloth  6 each Topical Daily   Gerhardt's butt cream   Topical BID   heparin  5,000 Units Subcutaneous Q12H   ipratropium-albuterol  3 mL Nebulization Q6H   metoprolol tartrate  25 mg Oral BID   Continuous Infusions:  cefTRIAXone (ROCEPHIN)  IV     cefTRIAXone (ROCEPHIN)  IV     metronidazole 500 mg (06/16/22 0854)    Procedures/Studies: ECHOCARDIOGRAM COMPLETE  Result Date: 06/16/2022    ECHOCARDIOGRAM REPORT   Patient Name:   KHARON SCHRAER Date of Exam: 06/16/2022 Medical Rec #:  [id number]     Height:       74.0 in Accession #:    [id number]    Weight:       250.0 lb Date of Birth:   1935/04/12      BSA:  2.390 m Patient Age:    87 years      BP:           144/75 mmHg Patient Gender: M             HR:           94 bpm. [facility] Procedure: 2D Echo, Cardiac Doppler, Color Doppler and Intracardiac            Opacification Agent Indications:    CHF-Acute Diastolic I50.31  History:        Patient has prior history of Echocardiogram examinations, most                 recent 11/19/2018. CAD, Signs/Symptoms:Shortness of Breath; Risk                 Factors:Hypertension, Dyslipidemia and Former Smoker.  Sonographer:    Aron Baba Referring Phys: [phone] Daliah Chaudoin  Sonographer Comments: Technically difficult study due to poor echo windows. Image acquisition challenging due to COPD. IMPRESSIONS  1. Left ventricular ejection fraction, by estimation, is 55 to 60%. The left ventricle has normal function. The left ventricle has no regional wall motion abnormalities. There is mild left ventricular hypertrophy. Left ventricular diastolic parameters are consistent with Grade I diastolic dysfunction (impaired relaxation).  2. RV not well visualized. Grossly appears normal in size and function. . Right ventricular systolic function was not well visualized. The right ventricular size is not well visualized. There is normal pulmonary artery systolic pressure.  3. Left atrial size was mildly dilated.  4. The mitral valve is normal in structure. No evidence of mitral valve regurgitation. No evidence of mitral stenosis.  5. The aortic valve is tricuspid. There is moderate calcification of the aortic valve. There is moderate thickening of the aortic valve. Aortic valve regurgitation is not visualized. No aortic stenosis is present.  6. There is mild dilatation of the ascending aorta, measuring 43 mm.  7. The inferior vena cava is normal in size with greater than 50% respiratory variability, suggesting right atrial pressure of 3 mmHg. FINDINGS  Left Ventricle: Left ventricular ejection fraction, by  estimation, is 55 to 60%. The left ventricle has normal function. The left ventricle has no regional wall motion abnormalities. Definity contrast agent was given IV to delineate the left ventricular  endocardial borders. The left ventricular internal cavity size was normal in size. There is mild left ventricular hypertrophy. Left ventricular diastolic parameters are consistent with Grade I diastolic dysfunction (impaired relaxation). Normal left ventricular filling pressure. Right Ventricle: RV not well visualized. Grossly appears normal in size and function. The right ventricular size is not well visualized. Right vetricular wall thickness was not well visualized. Right ventricular systolic function was not well visualized.  There is normal pulmonary artery systolic pressure. The tricuspid regurgitant velocity is 1.62 m/s, and with an assumed right atrial pressure of 3 mmHg, the estimated right ventricular systolic pressure is 13.5 mmHg. Left Atrium: Left atrial size was mildly dilated. Right Atrium: Right atrial size was normal in size. Pericardium: There is no evidence of pericardial effusion. Mitral Valve: The mitral valve is normal in structure. There is mild thickening of the mitral valve leaflet(s). There is mild calcification of the mitral valve leaflet(s). Mild mitral annular calcification. No evidence of mitral valve regurgitation. No evidence of mitral valve stenosis. Tricuspid Valve: The tricuspid valve is normal in structure. Tricuspid valve regurgitation is not demonstrated. No evidence of tricuspid stenosis. Aortic Valve:  The aortic valve is tricuspid. There is moderate calcification of the aortic valve. There is moderate thickening of the aortic valve. There is moderate aortic valve annular calcification. Aortic valve regurgitation is not visualized. No aortic stenosis is present. Aortic valve mean gradient measures 9.0 mmHg. Aortic valve peak gradient measures 17.6 mmHg. Aortic valve area, by VTI  measures 2.99 cm. Pulmonic Valve: The pulmonic valve was not well visualized. Pulmonic valve regurgitation is not visualized. No evidence of pulmonic stenosis. Aorta: The aortic root is normal in size and structure. There is mild dilatation of the ascending aorta, measuring 43 mm. Venous: The inferior vena cava is normal in size with greater than 50% respiratory variability, suggesting right atrial pressure of 3 mmHg. IAS/Shunts: No atrial level shunt detected by color flow Doppler.  LEFT VENTRICLE PLAX 2D LVIDd:         4.10 cm   Diastology LVIDs:         3.00 cm   LV e' medial:    4.88 cm/s LV PW:         1.30 cm   LV E/e' medial:  15.5 LV IVS:        1.10 cm   LV e' lateral:   9.67 cm/s LVOT diam:     2.40 cm   LV E/e' lateral: 7.8 LV SV:         102 LV SV Index:   43 LVOT Area:     4.52 cm  LEFT ATRIUM           Index        RIGHT ATRIUM           Index LA diam:      4.00 cm 1.67 cm/m   RA Area:     17.10 cm LA Vol (A2C): 77.0 ml 32.22 ml/m  RA Volume:   32.70 ml  13.68 ml/m LA Vol (A4C): 92.1 ml 38.54 ml/m  AORTIC VALVE                     PULMONIC VALVE AV Area (Vmax):    2.95 cm      PR End Diast Vel: 6.97 msec AV Area (Vmean):   2.70 cm AV Area (VTI):     2.99 cm AV Vmax:           210.00 cm/s AV Vmean:          137.500 cm/s AV VTI:            0.340 m AV Peak Grad:      17.6 mmHg AV Mean Grad:      9.0 mmHg LVOT Vmax:         137.00 cm/s LVOT Vmean:        82.000 cm/s LVOT VTI:          0.225 m LVOT/AV VTI ratio: 0.66  AORTA Ao Root diam: 3.80 cm Ao Asc diam:  4.30 cm MITRAL VALVE                TRICUSPID VALVE MV Area (PHT): 2.84 cm     TR Peak grad:   10.5 mmHg MV Decel Time: 267 msec     TR Vmax:        162.00 cm/s MV E velocity: 75.70 cm/s MV A velocity: 107.00 cm/s  SHUNTS MV E/A ratio:  0.71         Systemic VTI:  0.22 m  Systemic Diam: 2.40 cm Dina Rich MD Electronically signed by Dina Rich MD Signature Date/Time: 06/16/2022/12:06:51 PM    Final    IR  NEPHROSTOMY PLACEMENT LEFT  Result Date: 06/15/2022 INDICATION: 87 year old male with bilateral hydronephrosis and acute renal failure. He presents for bilateral percutaneous nephrostomy tube placement. EXAM: IR NEPHROSTOMY PLACEMENT LEFT; IR NEPHROSTOMY PLACEMENT RIGHT COMPARISON:  None Available. MEDICATIONS: Rocephin 2 g IV; The antibiotic was administered in an appropriate time frame prior to skin puncture. ANESTHESIA/SEDATION: Fentanyl 100 mcg IV; Versed 2 mg IV Moderate Sedation Time:  25 minutes The patient's vital signs and level of consciousness were continuously monitored during the procedure by the interventional radiology nurse under my direct supervision. CONTRAST:  20mL OMNIPAQUE IOHEXOL 300 MG/ML SOLN - administered into the collecting system(s) FLUOROSCOPY: Fluoroscopy Time:  minutes  seconds ( mGy). COMPLICATIONS: None immediate. TECHNIQUE: The procedure, risks, benefits, and alternatives were explained to the patient. Questions regarding the procedure were encouraged and answered. The patient understands and consents to the procedure. LEFT The left flank was prepped with chlorhexidine in a sterile fashion, and a sterile drape was applied covering the operative field. A sterile gown and sterile gloves were used for the procedure. Local anesthesia was provided with 1% Lidocaine. The left flank was interrogated with ultrasound and the left kidney identified. The kidney is hydronephrotic. A suitable access site on the skin overlying the lower pole, posterior calix was identified. After local mg anesthesia was achieved, a small skin nick was made with an 11 blade scalpel. A 21 gauge Accustick needle was then advanced under direct sonographic guidance into the lower pole of the left kidney. A 0.018 inch wire was advanced under fluoroscopic guidance into the left renal collecting system. The Accustick sheath was then advanced over the wire and a 0.018 system exchanged for a 0.035 system. Gentle hand  injection of contrast material confirms placement of the sheath within the renal collecting system. There is severe hydronephrosis. The tract from the scan into the renal collecting system was then dilated serially to 10-French. A 10-French Cook all-purpose drain was then placed and positioned under fluoroscopic guidance. The locking loop is well formed within the left renal pelvis. The catheter was secured to the skin with 2-0 Prolene and a sterile bandage was placed. Catheter was left to gravity bag drainage. RIGHT The right flank was prepped with chlorhexidine in a sterile fashion, and a sterile drape was applied covering the operative field. A sterile gown and sterile gloves were used for the procedure. Local anesthesia was provided with 1% Lidocaine. The right flank was interrogated with ultrasound and the left kidney identified. The kidney is hydronephrotic. There are multiple large cysts overlying the posterior calices. A suitable access site on the skin overlying the lower pole, posterior calix was identified. After local anesthesia was achieved, a small skin nick was made with an 11 blade scalpel. A 21 gauge Accustick needle was then advanced under direct sonographic guidance into the cyst. Aspiration was performed. The cyst was decompressed. Next, the Accustick needle was advanced into the calyx within the lower pole of the right kidney. A 0.018 inch wire was advanced under fluoroscopic guidance into the left renal collecting system. The Accustick sheath was then advanced over the wire and a 0.018 system exchanged for a 0.035 system. Gentle hand injection of contrast material confirms placement of the sheath within the renal collecting system. There is severe hydronephrosis. The tract from the scan into the renal collecting system was then dilated  serially to 10-French. A 10-French Cook all-purpose drain was then placed and positioned under fluoroscopic guidance. The locking loop is well formed within the  left renal pelvis. The catheter was secured to the skin with 2-0 Prolene and a sterile bandage was placed. Catheter was left to gravity bag drainage. IMPRESSION: Successful placement of a bilateral 10 French percutaneous nephrostomy tubes. Electronically Signed   By: Malachy Moan M.D.   On: 06/15/2022 17:34   IR NEPHROSTOMY PLACEMENT RIGHT  Result Date: 06/15/2022 INDICATION: 87 year old male with bilateral hydronephrosis and acute renal failure. He presents for bilateral percutaneous nephrostomy tube placement. EXAM: IR NEPHROSTOMY PLACEMENT LEFT; IR NEPHROSTOMY PLACEMENT RIGHT COMPARISON:  None Available. MEDICATIONS: Rocephin 2 g IV; The antibiotic was administered in an appropriate time frame prior to skin puncture. ANESTHESIA/SEDATION: Fentanyl 100 mcg IV; Versed 2 mg IV Moderate Sedation Time:  25 minutes The patient's vital signs and level of consciousness were continuously monitored during the procedure by the interventional radiology nurse under my direct supervision. CONTRAST:  20mL OMNIPAQUE IOHEXOL 300 MG/ML SOLN - administered into the collecting system(s) FLUOROSCOPY: Fluoroscopy Time:  minutes  seconds ( mGy). COMPLICATIONS: None immediate. TECHNIQUE: The procedure, risks, benefits, and alternatives were explained to the patient. Questions regarding the procedure were encouraged and answered. The patient understands and consents to the procedure. LEFT The left flank was prepped with chlorhexidine in a sterile fashion, and a sterile drape was applied covering the operative field. A sterile gown and sterile gloves were used for the procedure. Local anesthesia was provided with 1% Lidocaine. The left flank was interrogated with ultrasound and the left kidney identified. The kidney is hydronephrotic. A suitable access site on the skin overlying the lower pole, posterior calix was identified. After local mg anesthesia was achieved, a small skin nick was made with an 11 blade scalpel. A 21 gauge  Accustick needle was then advanced under direct sonographic guidance into the lower pole of the left kidney. A 0.018 inch wire was advanced under fluoroscopic guidance into the left renal collecting system. The Accustick sheath was then advanced over the wire and a 0.018 system exchanged for a 0.035 system. Gentle hand injection of contrast material confirms placement of the sheath within the renal collecting system. There is severe hydronephrosis. The tract from the scan into the renal collecting system was then dilated serially to 10-French. A 10-French Cook all-purpose drain was then placed and positioned under fluoroscopic guidance. The locking loop is well formed within the left renal pelvis. The catheter was secured to the skin with 2-0 Prolene and a sterile bandage was placed. Catheter was left to gravity bag drainage. RIGHT The right flank was prepped with chlorhexidine in a sterile fashion, and a sterile drape was applied covering the operative field. A sterile gown and sterile gloves were used for the procedure. Local anesthesia was provided with 1% Lidocaine. The right flank was interrogated with ultrasound and the left kidney identified. The kidney is hydronephrotic. There are multiple large cysts overlying the posterior calices. A suitable access site on the skin overlying the lower pole, posterior calix was identified. After local anesthesia was achieved, a small skin nick was made with an 11 blade scalpel. A 21 gauge Accustick needle was then advanced under direct sonographic guidance into the cyst. Aspiration was performed. The cyst was decompressed. Next, the Accustick needle was advanced into the calyx within the lower pole of the right kidney. A 0.018 inch wire was advanced under fluoroscopic guidance into the left renal  collecting system. The Accustick sheath was then advanced over the wire and a 0.018 system exchanged for a 0.035 system. Gentle hand injection of contrast material confirms  placement of the sheath within the renal collecting system. There is severe hydronephrosis. The tract from the scan into the renal collecting system was then dilated serially to 10-French. A 10-French Cook all-purpose drain was then placed and positioned under fluoroscopic guidance. The locking loop is well formed within the left renal pelvis. The catheter was secured to the skin with 2-0 Prolene and a sterile bandage was placed. Catheter was left to gravity bag drainage. IMPRESSION: Successful placement of a bilateral 10 French percutaneous nephrostomy tubes. Electronically Signed   By: Malachy Moan M.D.   On: 06/15/2022 17:34   US RENAL  Result Date: 06/15/2022 CLINICAL DATA:  [id number] AKI (acute kidney injury) (HCC) [id number] EXAM: RENAL / URINARY TRACT ULTRASOUND COMPLETE COMPARISON:  CT 07/09/2022 FINDINGS: Right Kidney: Renal measurements: 13.1 x 7.0 x 7.6 cm = volume: 363.7 mL. Persistent moderate hydronephrosis. Left Kidney: Renal measurements: 11.9 x 7.4 x 5.6 cm = volume: 260.1 mL. Persistent moderate hydronephrosis. Bladder: Foley catheter is in place with heterogeneous hyperechoic material adjacent to the Foley balloon, which is avascular. Posterior bladder wall thickening. Other: None. IMPRESSION: Persistent moderate hydronephrosis, similar to recent CT. Intraluminal echogenic material in the bladder which could represent blood clot, correlate with urinalysis. Posterior bladder wall thickening as seen on recent CT. Electronically Signed   By: Caprice Renshaw M.D.   On: 06/15/2022 12:38   US Venous Img Lower Bilateral (DVT)  Result Date: 06/15/2022 CLINICAL DATA:  Bilateral lower extremity pain and edema, left-greater-than-right. Former smoker. Evaluate for DVT. EXAM: BILATERAL LOWER EXTREMITY VENOUS DOPPLER ULTRASOUND TECHNIQUE: Gray-scale sonography with graded compression, as well as color Doppler and duplex ultrasound were performed to evaluate the lower extremity deep venous systems from the level  of the common femoral vein and including the common femoral, femoral, profunda femoral, popliteal and calf veins including the posterior tibial, peroneal and gastrocnemius veins when visible. The superficial great saphenous vein was also interrogated. Spectral Doppler was utilized to evaluate flow at rest and with distal augmentation maneuvers in the common femoral, femoral and popliteal veins. COMPARISON:  CT abdomen and pelvis-06/26/2022 FINDINGS: Examination is degraded due to patient body habitus and poor sonographic window RIGHT LOWER EXTREMITY Common Femoral Vein: No evidence of thrombus. Normal compressibility, respiratory phasicity and response to augmentation. Saphenofemoral Junction: No evidence of thrombus. Normal compressibility and flow on color Doppler imaging. Profunda Femoral Vein: No evidence of thrombus. Normal compressibility and flow on color Doppler imaging. Femoral Vein: No evidence of thrombus. Normal compressibility, respiratory phasicity and response to augmentation. Popliteal Vein: No evidence of thrombus. Normal compressibility, respiratory phasicity and response to augmentation. Calf Veins: No evidence of thrombus. Normal compressibility and flow on color Doppler imaging. Superficial Great Saphenous Vein: No evidence of thrombus. Normal compressibility. Other Findings:  None. LEFT LOWER EXTREMITY Common Femoral Vein: No evidence of thrombus. Normal compressibility, respiratory phasicity and response to augmentation. Saphenofemoral Junction: No evidence of thrombus. Normal compressibility and flow on color Doppler imaging. Profunda Femoral Vein: No evidence of thrombus. Normal compressibility and flow on color Doppler imaging. Femoral Vein: No evidence of thrombus. Normal compressibility, respiratory phasicity and response to augmentation. Popliteal Vein: No evidence of thrombus. Normal compressibility, respiratory phasicity and response to augmentation. Calf Veins: No evidence of  thrombus. Normal compressibility and flow on color Doppler imaging. Superficial Great Saphenous Vein: No evidence of  thrombus. Normal compressibility. Other Findings: Pathologically enlarged left inguinal lymph nodes as demonstrated on preceding abdominal CT with index inguinal lymph node measuring 1.3 cm in greatest short axis diameter (image 49). There is a moderate amount of subcutaneous edema at the level of the left lower leg and calf. IMPRESSION: 1. No evidence of DVT within the left lower extremity. 2. Pathologically enlarged left inguinal lymph nodes, similar to preceding abdominal CT performed 07/04/2022. Electronically Signed   By: Simonne Come M.D.   On: 06/15/2022 10:59   DG Chest Port 1V same Day  Result Date: 06/15/2022 CLINICAL DATA:  Dyspnea EXAM: PORTABLE CHEST 1 VIEW COMPARISON:  08/15/2019, CT 06/05/2022 FINDINGS: Stable coarsening of the pulmonary interstitium. The lungs are symmetrically well inflated. Known spiculated pulmonary nodule within the peripheral right upper lobe seen on prior CT examination is not well appreciated on this exam. New retrocardiac opacity in keeping with a focal pulmonary infiltrate within this region, possibly infectious in the acute setting. No pneumothorax or pleural effusion. Cardiac size within normal limits. Central pulmonary vascular congestion without overt pulmonary edema. IMPRESSION: 1. New retrocardiac pulmonary infiltrate, possibly infectious in the acute setting. 2. Central pulmonary vascular congestion without overt pulmonary edema. 3. Known right upper lobe spiculated pulmonary nodule not well appreciated on this exam. Electronically Signed   By: Helyn Numbers M.D.   On: 06/15/2022 00:48   CT ABDOMEN PELVIS WO CONTRAST  Result Date: 06/20/2022 CLINICAL DATA:  Abdominal pain, nausea and diarrhea, history of colon cancer EXAM: CT ABDOMEN AND PELVIS WITHOUT CONTRAST TECHNIQUE: Multidetector CT imaging of the abdomen and pelvis was performed following  the standard protocol without IV contrast. Unenhanced CT was performed per clinician order. Lack of IV contrast limits sensitivity and specificity, especially for evaluation of abdominal/pelvic solid viscera. RADIATION DOSE REDUCTION: This exam was performed according to the departmental dose-optimization program which includes automated exposure control, adjustment of the mA and/or kV according to patient size and/or use of iterative reconstruction technique. COMPARISON:  05/16/2022 FINDINGS: Lower chest: New consolidation within the posterior left lower lobe at the costophrenic angle, which could be atelectasis or acute airspace disease. No pleural effusion. Hepatobiliary: Unremarkable unenhanced appearance of the liver. The gallbladder is moderately distended, without evidence of cholelithiasis or cholecystitis. Pancreas: Unremarkable unenhanced appearance. Spleen: Unremarkable unenhanced appearance. Adrenals/Urinary Tract: Bilateral hydronephrosis is again identified, as seen on recent chest CT. There is also bilateral hydroureter to the level of the base of the bladder. There is marked posterior bladder wall thickening, measuring up to 14 mm. High attenuation material seen within the bladder surrounding the Foley catheter balloon consistent with blood products. Differential diagnosis include cystitis, chronic scarring from previous radiation therapy in a patient with a history of colon cancer, or bladder neoplasm. Urology consultation recommended. There are bilateral nonobstructing renal calculi, measuring 5 mm on the left and 3 mm on the right. Simple appearing right renal cysts do not require specific imaging follow-up. The adrenals are unremarkable. Stomach/Bowel: Postsurgical changes are seen from distal colectomy and reanastomosis. There is segmental wall thickening of the descending colon extending to the level of the anastomosis, compatible with inflammatory or infectious colitis. Moderate distension of  the colon with retained gas and stool. No evidence of small-bowel obstruction. Vascular/Lymphatic: Lymphadenopathy has developed within the bilateral inguinal regions and retroperitoneum. Index lymph node in the left inguinal region reference image 76/2 measures 15 mm in short axis. Left para-aortic adenopathy measures up to 14 mm in short axis reference image 43/2. There  is extensive atherosclerosis throughout the aorta and its distal branches. Evaluation of the vascular lumen is limited without IV contrast. Reproductive: The prostate is enlarged, measuring 5.7 x 4.8 cm in cross-sectional diameter. Other: There is diffuse fat stranding in the perirectal region, surrounding the prostate, and adjacent to the wall thickening at the base of the bladder. This is nonspecific, and could be related to prior radiation therapy. There is no free intraperitoneal fluid or free intraperitoneal gas. There are fat containing midline ventral hernias. The periumbilical fat containing ventral hernia on image 53 demonstrates stranding within the herniated fat, could reflect incarceration. No bowel herniation. There is also evidence of prior ventral hernia repair. Musculoskeletal: No acute or destructive bony lesions. Stable severe right hip osteoarthritis. Reconstructed images demonstrate no additional findings. IMPRESSION: 1. Marked wall thickening of the posterior aspect of the bladder, measuring up to 14 mm. This results in severe bilateral hydroureteronephrosis. Differential diagnosis includes neoplasm, post radiation change, or cystitis. High attenuation material surrounding the Foley catheter within the bladder lumen consistent with blood products. Urology consultation and cystoscopy recommended. 2. Segmental wall thickening of the distal colon, extending from the splenic flexure through the rectosigmoid anastomosis, suspicious for underlying inflammatory or infectious colitis. 3. New retroperitoneal and bilateral inguinal  lymphadenopathy. Metastatic disease not excluded. 4. New dependent consolidation within the left lower lobe posterior costophrenic angle, consistent with pneumonia or atelectasis. 5. Fat containing midline ventral hernias. The inferior ventral hernia demonstrates stranding within the herniated fat, incarcerated hernia not excluded. No bowel herniation. 6. Enlarged prostate. 7. Nonspecific fat stranding within the lower pelvis surrounding the rectosigmoid anastomosis, prostate, and bladder base. This could be sequela of prior radiation therapy. 8.  Aortic Atherosclerosis (ICD10-I70.0). 9. Bilateral nonobstructing renal calculi. Electronically Signed   By: Sharlet Salina M.D.   On: 06/16/2022 17:06   CT Chest Wo Contrast  Result Date: 06/09/2022 CLINICAL DATA:  History of colon cancer. Status post chemotherapy and radiation. Lung nodules. EXAM: CT CHEST WITHOUT CONTRAST TECHNIQUE: Multidetector CT imaging of the chest was performed following the standard protocol without IV contrast. RADIATION DOSE REDUCTION: This exam was performed according to the departmental dose-optimization program which includes automated exposure control, adjustment of the mA and/or kV according to patient size and/or use of iterative reconstruction technique. COMPARISON:  CT 09/26/2021 and older FINDINGS: Cardiovascular: Heart is nonenlarged. No pericardial effusion. Coronary artery calcifications are seen. The thoracic aorta has some scattered vascular calcifications. Tortuous course of the descending thoracic aorta. Mediastinum/Nodes: Small hiatal hernia. Normal caliber thoracic esophagus. The esophagus is slightly patulous. Heterogeneous thyroid gland. No specific abnormal lymph node enlargement identified in the axillary region or hilum on this noncontrast examination. There are some small mediastinal nodes, nonpathologic by size criteria. This includes retrocrural. Example series 2, image 132 right retrocrural measures 12 x 9 mm  today and previously 12 x 8 mm. Lungs/Pleura: Breathing motion. Dependent atelectasis. No consolidation, pneumothorax or effusion. Upper lung zone centrilobular emphysematous lung changes are identified. Apical pleural thickening on the left-greater-than-right. Spiculated semi-solid nodule seen in the right upper lobe on the prior that measured 16 mm overall, today on series 2, image 62 again measures 16 by 10 mm, unchanged when measured in the same fashion. The solid component more superior the was measured at 9 mm is stable. On the study of December 2021, the lesion is remeasured in the same fashion as today and at that time would have measured 15 x 9 mm. Very similar. Left upper lobe 5 mm  nodule at the apex is stable today on series 2, image 32. This lesion has been stable since December 2021. Upper Abdomen: In the upper abdomen the adrenal glands are diffusely thickened and nodular. New collecting system dilatation of the kidneys. Etiology is uncertain. Musculoskeletal: Diffuse degenerative changes seen along the spine. IMPRESSION: Bilateral lung nodule seen previously are similar today going back to a study of December 2021. Long-term stability. No specific additional imaging follow-up of the lung nodules. Underlying emphysematous lung changes. However there is new collecting system dilatation of the kidneys at the edge of the imaging field of uncertain etiology. Recommend dedicated abdominal imaging to confirm etiology and correlate with known history. Findings will be called to the ordering service by the Radiology physician assistant team Aortic Atherosclerosis (ICD10-I70.0) and Emphysema (ICD10-J43.9). Electronically Signed   By: Karen Kays M.D.   On: 06/09/2022 15:15    Catarina Hartshorn, DO  [facility]  If 7PM-7AM, please contact night-coverage [email or web address] Password [facility] 06/16/2022, 5:34 PM   LOS: 2 days

## 2022-06-16 NOTE — Telephone Encounter (Signed)
Please arrange hospital follow up in 2-3 weeks.  

## 2022-06-16 NOTE — Progress Notes (Signed)
Patient had moderate amount of bleeding from his penis. Patient has no complaints of pain. Dr. Thea Silversmith notified.

## 2022-06-16 NOTE — Progress Notes (Signed)
Referring Physician(s): Dr. Ronne Binning  Supervising Physician: Irish Lack  Patient Status:  [facility] Inpatient   Chief Complaint: Bilateral hydronephrosis due to ureteral obstruction in the setting of suspected bladder cancer +/- metastatic disease. S/p bilateral percutaneous nephrostomy tubes 06/15/22 by Dr. Archer Asa  Subjective: Patient sitting up in bed. His wife is at the bedside. Patient states he feels much better. High output from both PCNs. Output is strongly blood-tinged.   Allergies: Other and Sulfa antibiotics  Medications: Prior to Admission medications   Medication Sig Start Date End Date Taking? Authorizing Provider  acetaminophen (TYLENOL) 500 MG tablet Take 1,000 mg by mouth every 6 (six) hours as needed.   Yes [provider]  albuterol (VENTOLIN HFA) 108 (90 Base) MCG/ACT inhaler Inhale 2 puffs into the lungs every 6 (six) hours as needed for wheezing or shortness of breath. Patient taking differently: Inhale 1 puff into the lungs every 6 (six) hours as needed for wheezing or shortness of breath. 08/29/21  Yes Cobb, Ruby Cola, NP  amLODipine-benazepril (LOTREL) 10-40 MG capsule Take 1 capsule by mouth daily.   Yes [provider]  aspirin 81 MG chewable tablet Chew 81 mg by mouth every evening.   Yes [provider]  atorvastatin (LIPITOR) 20 MG tablet Take 1 tablet (20 mg total) by mouth every evening. 11/22/18  Yes Glade Lloyd, MD  Cholecalciferol (VITAMIN D3) 50 MCG (2000 UT) TABS Take 2,000 Units by mouth daily.   Yes [provider]  Cinnamon 500 MG TABS Take 500 mg by mouth 2 (two) times daily.   Yes [provider]  Cyanocobalamin (VITAMIN B12 PO) Use as directed 1 tablet in the mouth or throat daily.   Yes [provider]  Emollient (CERAVE) CREA Apply 1 application topically 2 (two) times daily.   Yes [provider]  ferrous sulfate 325 (65 FE) MG tablet Take 325 mg by mouth daily with  breakfast.   Yes [provider]  Fluticasone-Umeclidin-Vilant (TRELEGY ELLIPTA) 200-62.5-25 MCG/ACT AEPB Inhale 1 puff into the lungs daily. 08/29/21  Yes Cobb, Ruby Cola, NP  hydrochlorothiazide (HYDRODIURIL) 25 MG tablet Take 25 mg by mouth daily.   Yes [provider]  loperamide (IMODIUM A-D) 2 MG tablet Take 2 mg by mouth 4 (four) times daily as needed for diarrhea or loose stools.   Yes [provider]  Magnesium 250 MG TABS Take 250 mg by mouth daily.    Yes [provider]  metoprolol tartrate (LOPRESSOR) 100 MG tablet Take 1 tablet (100 mg total) by mouth at bedtime. 11/22/18  Yes Glade Lloyd, MD  Multiple Vitamins-Minerals (MENS MULTIPLE VITAMIN/LYCOPENE PO) Take 1 tablet by mouth daily.   Yes [provider]  nitroGLYCERIN (NITROSTAT) 0.4 MG SL tablet Place 1 tablet (0.4 mg total) under the tongue every 5 (five) minutes as needed. 12/27/12  Yes Rollene Rotunda, MD  OVER THE COUNTER MEDICATION Take 1 tablet by mouth 2 (two) times daily. Fish, flax and borage oil   Yes [provider]  Turmeric (QC TUMERIC COMPLEX PO) Take by mouth 2 (two) times daily.   Yes [provider]  zinc gluconate 50 MG tablet Take 50 mg by mouth daily.   Yes [provider]     Vital Signs: BP (!) 144/75   Pulse 69   Temp 97.9 F (36.6 C)   Resp 20   Ht 6\' 2"  (1.88 m)   Wt 250 lb (113.4 kg)   SpO2 98%  BMI 32.10 kg/m   Physical Exam Constitutional:      General: He is not in acute distress.    Appearance: He is not ill-appearing.  Pulmonary:     Effort: Pulmonary effort is normal.  Genitourinary:    Comments: Bilateral PCNs each with approximately 300 ml of blood-tinged urine in gravity bags.  Skin:    General: Skin is warm and dry.  Neurological:     Mental Status: He is alert and oriented to person, place, and time.     Imaging: IR NEPHROSTOMY PLACEMENT LEFT  Result Date: 06/15/2022 INDICATION: 87 year old  male with bilateral hydronephrosis and acute renal failure. He presents for bilateral percutaneous nephrostomy tube placement. EXAM: IR NEPHROSTOMY PLACEMENT LEFT; IR NEPHROSTOMY PLACEMENT RIGHT COMPARISON:  None Available. MEDICATIONS: Rocephin 2 g IV; The antibiotic was administered in an appropriate time frame prior to skin puncture. ANESTHESIA/SEDATION: Fentanyl 100 mcg IV; Versed 2 mg IV Moderate Sedation Time:  25 minutes The patient's vital signs and level of consciousness were continuously monitored during the procedure by the interventional radiology nurse under my direct supervision. CONTRAST:  20mL OMNIPAQUE IOHEXOL 300 MG/ML SOLN - administered into the collecting system(s) FLUOROSCOPY: Fluoroscopy Time:  minutes  seconds ( mGy). COMPLICATIONS: None immediate. TECHNIQUE: The procedure, risks, benefits, and alternatives were explained to the patient. Questions regarding the procedure were encouraged and answered. The patient understands and consents to the procedure. LEFT The left flank was prepped with chlorhexidine in a sterile fashion, and a sterile drape was applied covering the operative field. A sterile gown and sterile gloves were used for the procedure. Local anesthesia was provided with 1% Lidocaine. The left flank was interrogated with ultrasound and the left kidney identified. The kidney is hydronephrotic. A suitable access site on the skin overlying the lower pole, posterior calix was identified. After local mg anesthesia was achieved, a small skin nick was made with an 11 blade scalpel. A 21 gauge Accustick needle was then advanced under direct sonographic guidance into the lower pole of the left kidney. A 0.018 inch wire was advanced under fluoroscopic guidance into the left renal collecting system. The Accustick sheath was then advanced over the wire and a 0.018 system exchanged for a 0.035 system. Gentle hand injection of contrast material confirms placement of the sheath within the renal  collecting system. There is severe hydronephrosis. The tract from the scan into the renal collecting system was then dilated serially to 10-French. A 10-French Cook all-purpose drain was then placed and positioned under fluoroscopic guidance. The locking loop is well formed within the left renal pelvis. The catheter was secured to the skin with 2-0 Prolene and a sterile bandage was placed. Catheter was left to gravity bag drainage. RIGHT The right flank was prepped with chlorhexidine in a sterile fashion, and a sterile drape was applied covering the operative field. A sterile gown and sterile gloves were used for the procedure. Local anesthesia was provided with 1% Lidocaine. The right flank was interrogated with ultrasound and the left kidney identified. The kidney is hydronephrotic. There are multiple large cysts overlying the posterior calices. A suitable access site on the skin overlying the lower pole, posterior calix was identified. After local anesthesia was achieved, a small skin nick was made with an 11 blade scalpel. A 21 gauge Accustick needle was then advanced under direct sonographic guidance into the cyst. Aspiration was performed. The cyst was decompressed. Next, the Accustick needle was advanced into the calyx within the lower pole of the  right kidney. A 0.018 inch wire was advanced under fluoroscopic guidance into the left renal collecting system. The Accustick sheath was then advanced over the wire and a 0.018 system exchanged for a 0.035 system. Gentle hand injection of contrast material confirms placement of the sheath within the renal collecting system. There is severe hydronephrosis. The tract from the scan into the renal collecting system was then dilated serially to 10-French. A 10-French Cook all-purpose drain was then placed and positioned under fluoroscopic guidance. The locking loop is well formed within the left renal pelvis. The catheter was secured to the skin with 2-0 Prolene and a  sterile bandage was placed. Catheter was left to gravity bag drainage. IMPRESSION: Successful placement of a bilateral 10 French percutaneous nephrostomy tubes. Electronically Signed   By: Malachy Moan M.D.   On: 06/15/2022 17:34   IR NEPHROSTOMY PLACEMENT RIGHT  Result Date: 06/15/2022 INDICATION: 87 year old male with bilateral hydronephrosis and acute renal failure. He presents for bilateral percutaneous nephrostomy tube placement. EXAM: IR NEPHROSTOMY PLACEMENT LEFT; IR NEPHROSTOMY PLACEMENT RIGHT COMPARISON:  None Available. MEDICATIONS: Rocephin 2 g IV; The antibiotic was administered in an appropriate time frame prior to skin puncture. ANESTHESIA/SEDATION: Fentanyl 100 mcg IV; Versed 2 mg IV Moderate Sedation Time:  25 minutes The patient's vital signs and level of consciousness were continuously monitored during the procedure by the interventional radiology nurse under my direct supervision. CONTRAST:  20mL OMNIPAQUE IOHEXOL 300 MG/ML SOLN - administered into the collecting system(s) FLUOROSCOPY: Fluoroscopy Time:  minutes  seconds ( mGy). COMPLICATIONS: None immediate. TECHNIQUE: The procedure, risks, benefits, and alternatives were explained to the patient. Questions regarding the procedure were encouraged and answered. The patient understands and consents to the procedure. LEFT The left flank was prepped with chlorhexidine in a sterile fashion, and a sterile drape was applied covering the operative field. A sterile gown and sterile gloves were used for the procedure. Local anesthesia was provided with 1% Lidocaine. The left flank was interrogated with ultrasound and the left kidney identified. The kidney is hydronephrotic. A suitable access site on the skin overlying the lower pole, posterior calix was identified. After local mg anesthesia was achieved, a small skin nick was made with an 11 blade scalpel. A 21 gauge Accustick needle was then advanced under direct sonographic guidance into the  lower pole of the left kidney. A 0.018 inch wire was advanced under fluoroscopic guidance into the left renal collecting system. The Accustick sheath was then advanced over the wire and a 0.018 system exchanged for a 0.035 system. Gentle hand injection of contrast material confirms placement of the sheath within the renal collecting system. There is severe hydronephrosis. The tract from the scan into the renal collecting system was then dilated serially to 10-French. A 10-French Cook all-purpose drain was then placed and positioned under fluoroscopic guidance. The locking loop is well formed within the left renal pelvis. The catheter was secured to the skin with 2-0 Prolene and a sterile bandage was placed. Catheter was left to gravity bag drainage. RIGHT The right flank was prepped with chlorhexidine in a sterile fashion, and a sterile drape was applied covering the operative field. A sterile gown and sterile gloves were used for the procedure. Local anesthesia was provided with 1% Lidocaine. The right flank was interrogated with ultrasound and the left kidney identified. The kidney is hydronephrotic. There are multiple large cysts overlying the posterior calices. A suitable access site on the skin overlying the lower pole, posterior calix was identified.  After local anesthesia was achieved, a small skin nick was made with an 11 blade scalpel. A 21 gauge Accustick needle was then advanced under direct sonographic guidance into the cyst. Aspiration was performed. The cyst was decompressed. Next, the Accustick needle was advanced into the calyx within the lower pole of the right kidney. A 0.018 inch wire was advanced under fluoroscopic guidance into the left renal collecting system. The Accustick sheath was then advanced over the wire and a 0.018 system exchanged for a 0.035 system. Gentle hand injection of contrast material confirms placement of the sheath within the renal collecting system. There is severe  hydronephrosis. The tract from the scan into the renal collecting system was then dilated serially to 10-French. A 10-French Cook all-purpose drain was then placed and positioned under fluoroscopic guidance. The locking loop is well formed within the left renal pelvis. The catheter was secured to the skin with 2-0 Prolene and a sterile bandage was placed. Catheter was left to gravity bag drainage. IMPRESSION: Successful placement of a bilateral 10 French percutaneous nephrostomy tubes. Electronically Signed   By: Malachy Moan M.D.   On: 06/15/2022 17:34   US RENAL  Result Date: 06/15/2022 CLINICAL DATA:  [id number] AKI (acute kidney injury) (HCC) [id number] EXAM: RENAL / URINARY TRACT ULTRASOUND COMPLETE COMPARISON:  CT 06/15/2022 FINDINGS: Right Kidney: Renal measurements: 13.1 x 7.0 x 7.6 cm = volume: 363.7 mL. Persistent moderate hydronephrosis. Left Kidney: Renal measurements: 11.9 x 7.4 x 5.6 cm = volume: 260.1 mL. Persistent moderate hydronephrosis. Bladder: Foley catheter is in place with heterogeneous hyperechoic material adjacent to the Foley balloon, which is avascular. Posterior bladder wall thickening. Other: None. IMPRESSION: Persistent moderate hydronephrosis, similar to recent CT. Intraluminal echogenic material in the bladder which could represent blood clot, correlate with urinalysis. Posterior bladder wall thickening as seen on recent CT. Electronically Signed   By: Caprice Renshaw M.D.   On: 06/15/2022 12:38   US Venous Img Lower Bilateral (DVT)  Result Date: 06/15/2022 CLINICAL DATA:  Bilateral lower extremity pain and edema, left-greater-than-right. Former smoker. Evaluate for DVT. EXAM: BILATERAL LOWER EXTREMITY VENOUS DOPPLER ULTRASOUND TECHNIQUE: Gray-scale sonography with graded compression, as well as color Doppler and duplex ultrasound were performed to evaluate the lower extremity deep venous systems from the level of the common femoral vein and including the common femoral, femoral,  profunda femoral, popliteal and calf veins including the posterior tibial, peroneal and gastrocnemius veins when visible. The superficial great saphenous vein was also interrogated. Spectral Doppler was utilized to evaluate flow at rest and with distal augmentation maneuvers in the common femoral, femoral and popliteal veins. COMPARISON:  CT abdomen and pelvis-06/26/2022 FINDINGS: Examination is degraded due to patient body habitus and poor sonographic window RIGHT LOWER EXTREMITY Common Femoral Vein: No evidence of thrombus. Normal compressibility, respiratory phasicity and response to augmentation. Saphenofemoral Junction: No evidence of thrombus. Normal compressibility and flow on color Doppler imaging. Profunda Femoral Vein: No evidence of thrombus. Normal compressibility and flow on color Doppler imaging. Femoral Vein: No evidence of thrombus. Normal compressibility, respiratory phasicity and response to augmentation. Popliteal Vein: No evidence of thrombus. Normal compressibility, respiratory phasicity and response to augmentation. Calf Veins: No evidence of thrombus. Normal compressibility and flow on color Doppler imaging. Superficial Great Saphenous Vein: No evidence of thrombus. Normal compressibility. Other Findings:  None. LEFT LOWER EXTREMITY Common Femoral Vein: No evidence of thrombus. Normal compressibility, respiratory phasicity and response to augmentation. Saphenofemoral Junction: No evidence of thrombus. Normal compressibility and flow on  color Doppler imaging. Profunda Femoral Vein: No evidence of thrombus. Normal compressibility and flow on color Doppler imaging. Femoral Vein: No evidence of thrombus. Normal compressibility, respiratory phasicity and response to augmentation. Popliteal Vein: No evidence of thrombus. Normal compressibility, respiratory phasicity and response to augmentation. Calf Veins: No evidence of thrombus. Normal compressibility and flow on color Doppler imaging.  Superficial Great Saphenous Vein: No evidence of thrombus. Normal compressibility. Other Findings: Pathologically enlarged left inguinal lymph nodes as demonstrated on preceding abdominal CT with index inguinal lymph node measuring 1.3 cm in greatest short axis diameter (image 49). There is a moderate amount of subcutaneous edema at the level of the left lower leg and calf. IMPRESSION: 1. No evidence of DVT within the left lower extremity. 2. Pathologically enlarged left inguinal lymph nodes, similar to preceding abdominal CT performed 06/23/2022. Electronically Signed   By: Simonne Come M.D.   On: 06/15/2022 10:59   DG Chest Port 1V same Day  Result Date: 06/15/2022 CLINICAL DATA:  Dyspnea EXAM: PORTABLE CHEST 1 VIEW COMPARISON:  08/15/2019, CT 06/05/2022 FINDINGS: Stable coarsening of the pulmonary interstitium. The lungs are symmetrically well inflated. Known spiculated pulmonary nodule within the peripheral right upper lobe seen on prior CT examination is not well appreciated on this exam. New retrocardiac opacity in keeping with a focal pulmonary infiltrate within this region, possibly infectious in the acute setting. No pneumothorax or pleural effusion. Cardiac size within normal limits. Central pulmonary vascular congestion without overt pulmonary edema. IMPRESSION: 1. New retrocardiac pulmonary infiltrate, possibly infectious in the acute setting. 2. Central pulmonary vascular congestion without overt pulmonary edema. 3. Known right upper lobe spiculated pulmonary nodule not well appreciated on this exam. Electronically Signed   By: Helyn Numbers M.D.   On: 06/15/2022 00:48   CT ABDOMEN PELVIS WO CONTRAST  Result Date: 06/18/2022 CLINICAL DATA:  Abdominal pain, nausea and diarrhea, history of colon cancer EXAM: CT ABDOMEN AND PELVIS WITHOUT CONTRAST TECHNIQUE: Multidetector CT imaging of the abdomen and pelvis was performed following the standard protocol without IV contrast. Unenhanced CT was  performed per clinician order. Lack of IV contrast limits sensitivity and specificity, especially for evaluation of abdominal/pelvic solid viscera. RADIATION DOSE REDUCTION: This exam was performed according to the departmental dose-optimization program which includes automated exposure control, adjustment of the mA and/or kV according to patient size and/or use of iterative reconstruction technique. COMPARISON:  05/16/2022 FINDINGS: Lower chest: New consolidation within the posterior left lower lobe at the costophrenic angle, which could be atelectasis or acute airspace disease. No pleural effusion. Hepatobiliary: Unremarkable unenhanced appearance of the liver. The gallbladder is moderately distended, without evidence of cholelithiasis or cholecystitis. Pancreas: Unremarkable unenhanced appearance. Spleen: Unremarkable unenhanced appearance. Adrenals/Urinary Tract: Bilateral hydronephrosis is again identified, as seen on recent chest CT. There is also bilateral hydroureter to the level of the base of the bladder. There is marked posterior bladder wall thickening, measuring up to 14 mm. High attenuation material seen within the bladder surrounding the Foley catheter balloon consistent with blood products. Differential diagnosis include cystitis, chronic scarring from previous radiation therapy in a patient with a history of colon cancer, or bladder neoplasm. Urology consultation recommended. There are bilateral nonobstructing renal calculi, measuring 5 mm on the left and 3 mm on the right. Simple appearing right renal cysts do not require specific imaging follow-up. The adrenals are unremarkable. Stomach/Bowel: Postsurgical changes are seen from distal colectomy and reanastomosis. There is segmental wall thickening of the descending colon extending to the level  of the anastomosis, compatible with inflammatory or infectious colitis. Moderate distension of the colon with retained gas and stool. No evidence of  small-bowel obstruction. Vascular/Lymphatic: Lymphadenopathy has developed within the bilateral inguinal regions and retroperitoneum. Index lymph node in the left inguinal region reference image 76/2 measures 15 mm in short axis. Left para-aortic adenopathy measures up to 14 mm in short axis reference image 43/2. There is extensive atherosclerosis throughout the aorta and its distal branches. Evaluation of the vascular lumen is limited without IV contrast. Reproductive: The prostate is enlarged, measuring 5.7 x 4.8 cm in cross-sectional diameter. Other: There is diffuse fat stranding in the perirectal region, surrounding the prostate, and adjacent to the wall thickening at the base of the bladder. This is nonspecific, and could be related to prior radiation therapy. There is no free intraperitoneal fluid or free intraperitoneal gas. There are fat containing midline ventral hernias. The periumbilical fat containing ventral hernia on image 53 demonstrates stranding within the herniated fat, could reflect incarceration. No bowel herniation. There is also evidence of prior ventral hernia repair. Musculoskeletal: No acute or destructive bony lesions. Stable severe right hip osteoarthritis. Reconstructed images demonstrate no additional findings. IMPRESSION: 1. Marked wall thickening of the posterior aspect of the bladder, measuring up to 14 mm. This results in severe bilateral hydroureteronephrosis. Differential diagnosis includes neoplasm, post radiation change, or cystitis. High attenuation material surrounding the Foley catheter within the bladder lumen consistent with blood products. Urology consultation and cystoscopy recommended. 2. Segmental wall thickening of the distal colon, extending from the splenic flexure through the rectosigmoid anastomosis, suspicious for underlying inflammatory or infectious colitis. 3. New retroperitoneal and bilateral inguinal lymphadenopathy. Metastatic disease not excluded. 4. New  dependent consolidation within the left lower lobe posterior costophrenic angle, consistent with pneumonia or atelectasis. 5. Fat containing midline ventral hernias. The inferior ventral hernia demonstrates stranding within the herniated fat, incarcerated hernia not excluded. No bowel herniation. 6. Enlarged prostate. 7. Nonspecific fat stranding within the lower pelvis surrounding the rectosigmoid anastomosis, prostate, and bladder base. This could be sequela of prior radiation therapy. 8.  Aortic Atherosclerosis (ICD10-I70.0). 9. Bilateral nonobstructing renal calculi. Electronically Signed   By: Sharlet Salina M.D.   On: 06/10/2022 17:06    Labs:  CBC: Recent Labs    07/08/2022 1459 06/15/22 0346 06/16/22 0412  WBC 15.6* 14.2* 13.1*  HGB 12.3* 9.9* 9.5*  HCT 37.9* 30.8* 29.2*  PLT 461* 379 383    COAGS: Recent Labs    06/15/22 1352  INR 1.2    BMP: Recent Labs    06/27/2022 1459 07/01/2022 1846 06/15/22 0346 06/16/22 0412  NA 139 138 139 142  K 5.6* 5.7* 4.8 4.4  CL 107 108 108 108  CO2 15* 13* 16* 20*  GLUCOSE 104* 106* 134* 101*  BUN 105* 105* 104* 97*  CALCIUM 9.0 8.6* 8.5* 8.4*  CREATININE 7.04* 7.06* 7.04* 6.77*  GFRNONAA 7* 7* 7* 7*    LIVER FUNCTION TESTS: Recent Labs    07/07/2022 1459 06/15/22 0346 06/16/22 0412  BILITOT 0.9 0.6  --   AST 13* 9*  --   ALT 11 9  --   ALKPHOS 63 45  --   PROT 7.3 6.2*  --   ALBUMIN 3.5 2.8* 2.7*    Assessment and Plan:  Bilateral hydronephrosis due to ureteral obstruction in the setting of suspected bladder cancer +/- metastatic disease. S/p bilateral percutaneous nephrostomy tubes 06/15/22 by Dr. Archer Asa  Slight decrease in WBC and creatinine levels.  Each gravity bag with approximately 300 ml of blood-tinged urine. Patient states he feels much better today.   24 hour output as documented on I/O flowsheet Left PCN: 1.1 L Right PCN 2 L  Please continue to flush each PCN every shift with 5-10 ml of normal saline.  Change the dressings daily or as needed.   IR will continue to follow.   Electronically Signed: Alwyn Ren, AGACNP-BC [phone] 06/16/2022, 9:36 AM   I spent a total of 15 Minutes at the the patient's bedside AND on the patient's hospital floor or unit, greater than 50% of which was counseling/coordinating care for bilateral PCNs

## 2022-06-16 NOTE — Progress Notes (Signed)
Subjective: Reports he is feeling much better. Abdominal pain essentially resolved. Distension also improving. Still having loose stools. No brbpr or melena. Tolerated regular diet this morning well.   Objective: Vital signs in last 24 hours: Temp:  [97.6 F (36.4 C)-98.2 F (36.8 C)] 97.9 F (36.6 C) (05/07 0507) Pulse Rate:  [66-85] 69 (05/07 0737) Resp:  [16-21] 20 (05/07 0737) BP: (105-144)/(61-92) 144/75 (05/07 0507) SpO2:  [83 %-98 %] 98 % (05/07 0737) Last BM Date : 06/15/22 General:   Alert and oriented, pleasant, NAD.  Head:  Normocephalic and atraumatic. Abdomen:  Bowel sounds present, distended, but non-tense. No tenderness to palpation, no rebound or guarding. No masses appreciated. GU: Foley and bilateral nephrostomy tube collection bags with bloody output. Neurologic:  Alert and  oriented x4;  grossly normal neurologically. Psych:  Normal mood and affect.  Intake/Output from previous day: 05/06 0701 - 05/07 0700 In: 1040 [P.O.:480; I.V.:460; IV Piggyback:100] Out: 3600 [Urine:3600] Intake/Output this shift: Total I/O In: 133.2 [IV Piggyback:133.2] Out: -   Lab Results: Recent Labs    07/01/2022 1459 06/15/22 0346 06/16/22 0412  WBC 15.6* 14.2* 13.1*  HGB 12.3* 9.9* 9.5*  HCT 37.9* 30.8* 29.2*  PLT 461* 379 383   BMET Recent Labs    07/09/2022 1846 06/15/22 0346 06/16/22 0412  NA 138 139 142  K 5.7* 4.8 4.4  CL 108 108 108  CO2 13* 16* 20*  GLUCOSE 106* 134* 101*  BUN 105* 104* 97*  CREATININE 7.06* 7.04* 6.77*  CALCIUM 8.6* 8.5* 8.4*   LFT Recent Labs    06/18/2022 1459 06/15/22 0346 06/16/22 0412  PROT 7.3 6.2*  --   ALBUMIN 3.5 2.8* 2.7*  AST 13* 9*  --   ALT 11 9  --   ALKPHOS 63 45  --   BILITOT 0.9 0.6  --    PT/INR Recent Labs    06/15/22 1352  LABPROT 15.4*  INR 1.2    Studies/Results: IR NEPHROSTOMY PLACEMENT LEFT  Result Date: 06/15/2022 INDICATION: 87 year old male with bilateral hydronephrosis and acute renal  failure. He presents for bilateral percutaneous nephrostomy tube placement. EXAM: IR NEPHROSTOMY PLACEMENT LEFT; IR NEPHROSTOMY PLACEMENT RIGHT COMPARISON:  None Available. MEDICATIONS: Rocephin 2 g IV; The antibiotic was administered in an appropriate time frame prior to skin puncture. ANESTHESIA/SEDATION: Fentanyl 100 mcg IV; Versed 2 mg IV Moderate Sedation Time:  25 minutes The patient's vital signs and level of consciousness were continuously monitored during the procedure by the interventional radiology nurse under my direct supervision. CONTRAST:  20mL OMNIPAQUE IOHEXOL 300 MG/ML SOLN - administered into the collecting system(s) FLUOROSCOPY: Fluoroscopy Time:  minutes  seconds ( mGy). COMPLICATIONS: None immediate. TECHNIQUE: The procedure, risks, benefits, and alternatives were explained to the patient. Questions regarding the procedure were encouraged and answered. The patient understands and consents to the procedure. LEFT The left flank was prepped with chlorhexidine in a sterile fashion, and a sterile drape was applied covering the operative field. A sterile gown and sterile gloves were used for the procedure. Local anesthesia was provided with 1% Lidocaine. The left flank was interrogated with ultrasound and the left kidney identified. The kidney is hydronephrotic. A suitable access site on the skin overlying the lower pole, posterior calix was identified. After local mg anesthesia was achieved, a small skin nick was made with an 11 blade scalpel. A 21 gauge Accustick needle was then advanced under direct sonographic guidance into the lower pole of the left kidney. A 0.018  inch wire was advanced under fluoroscopic guidance into the left renal collecting system. The Accustick sheath was then advanced over the wire and a 0.018 system exchanged for a 0.035 system. Gentle hand injection of contrast material confirms placement of the sheath within the renal collecting system. There is severe hydronephrosis.  The tract from the scan into the renal collecting system was then dilated serially to 10-French. A 10-French Cook all-purpose drain was then placed and positioned under fluoroscopic guidance. The locking loop is well formed within the left renal pelvis. The catheter was secured to the skin with 2-0 Prolene and a sterile bandage was placed. Catheter was left to gravity bag drainage. RIGHT The right flank was prepped with chlorhexidine in a sterile fashion, and a sterile drape was applied covering the operative field. A sterile gown and sterile gloves were used for the procedure. Local anesthesia was provided with 1% Lidocaine. The right flank was interrogated with ultrasound and the left kidney identified. The kidney is hydronephrotic. There are multiple large cysts overlying the posterior calices. A suitable access site on the skin overlying the lower pole, posterior calix was identified. After local anesthesia was achieved, a small skin nick was made with an 11 blade scalpel. A 21 gauge Accustick needle was then advanced under direct sonographic guidance into the cyst. Aspiration was performed. The cyst was decompressed. Next, the Accustick needle was advanced into the calyx within the lower pole of the right kidney. A 0.018 inch wire was advanced under fluoroscopic guidance into the left renal collecting system. The Accustick sheath was then advanced over the wire and a 0.018 system exchanged for a 0.035 system. Gentle hand injection of contrast material confirms placement of the sheath within the renal collecting system. There is severe hydronephrosis. The tract from the scan into the renal collecting system was then dilated serially to 10-French. A 10-French Cook all-purpose drain was then placed and positioned under fluoroscopic guidance. The locking loop is well formed within the left renal pelvis. The catheter was secured to the skin with 2-0 Prolene and a sterile bandage was placed. Catheter was left to  gravity bag drainage. IMPRESSION: Successful placement of a bilateral 10 French percutaneous nephrostomy tubes. Electronically Signed   By: Malachy Moan M.D.   On: 06/15/2022 17:34   IR NEPHROSTOMY PLACEMENT RIGHT  Result Date: 06/15/2022 INDICATION: 87 year old male with bilateral hydronephrosis and acute renal failure. He presents for bilateral percutaneous nephrostomy tube placement. EXAM: IR NEPHROSTOMY PLACEMENT LEFT; IR NEPHROSTOMY PLACEMENT RIGHT COMPARISON:  None Available. MEDICATIONS: Rocephin 2 g IV; The antibiotic was administered in an appropriate time frame prior to skin puncture. ANESTHESIA/SEDATION: Fentanyl 100 mcg IV; Versed 2 mg IV Moderate Sedation Time:  25 minutes The patient's vital signs and level of consciousness were continuously monitored during the procedure by the interventional radiology nurse under my direct supervision. CONTRAST:  20mL OMNIPAQUE IOHEXOL 300 MG/ML SOLN - administered into the collecting system(s) FLUOROSCOPY: Fluoroscopy Time:  minutes  seconds ( mGy). COMPLICATIONS: None immediate. TECHNIQUE: The procedure, risks, benefits, and alternatives were explained to the patient. Questions regarding the procedure were encouraged and answered. The patient understands and consents to the procedure. LEFT The left flank was prepped with chlorhexidine in a sterile fashion, and a sterile drape was applied covering the operative field. A sterile gown and sterile gloves were used for the procedure. Local anesthesia was provided with 1% Lidocaine. The left flank was interrogated with ultrasound and the left kidney identified. The kidney is hydronephrotic. A  suitable access site on the skin overlying the lower pole, posterior calix was identified. After local mg anesthesia was achieved, a small skin nick was made with an 11 blade scalpel. A 21 gauge Accustick needle was then advanced under direct sonographic guidance into the lower pole of the left kidney. A 0.018 inch wire was  advanced under fluoroscopic guidance into the left renal collecting system. The Accustick sheath was then advanced over the wire and a 0.018 system exchanged for a 0.035 system. Gentle hand injection of contrast material confirms placement of the sheath within the renal collecting system. There is severe hydronephrosis. The tract from the scan into the renal collecting system was then dilated serially to 10-French. A 10-French Cook all-purpose drain was then placed and positioned under fluoroscopic guidance. The locking loop is well formed within the left renal pelvis. The catheter was secured to the skin with 2-0 Prolene and a sterile bandage was placed. Catheter was left to gravity bag drainage. RIGHT The right flank was prepped with chlorhexidine in a sterile fashion, and a sterile drape was applied covering the operative field. A sterile gown and sterile gloves were used for the procedure. Local anesthesia was provided with 1% Lidocaine. The right flank was interrogated with ultrasound and the left kidney identified. The kidney is hydronephrotic. There are multiple large cysts overlying the posterior calices. A suitable access site on the skin overlying the lower pole, posterior calix was identified. After local anesthesia was achieved, a small skin nick was made with an 11 blade scalpel. A 21 gauge Accustick needle was then advanced under direct sonographic guidance into the cyst. Aspiration was performed. The cyst was decompressed. Next, the Accustick needle was advanced into the calyx within the lower pole of the right kidney. A 0.018 inch wire was advanced under fluoroscopic guidance into the left renal collecting system. The Accustick sheath was then advanced over the wire and a 0.018 system exchanged for a 0.035 system. Gentle hand injection of contrast material confirms placement of the sheath within the renal collecting system. There is severe hydronephrosis. The tract from the scan into the renal  collecting system was then dilated serially to 10-French. A 10-French Cook all-purpose drain was then placed and positioned under fluoroscopic guidance. The locking loop is well formed within the left renal pelvis. The catheter was secured to the skin with 2-0 Prolene and a sterile bandage was placed. Catheter was left to gravity bag drainage. IMPRESSION: Successful placement of a bilateral 10 French percutaneous nephrostomy tubes. Electronically Signed   By: Malachy Moan M.D.   On: 06/15/2022 17:34   US RENAL  Result Date: 06/15/2022 CLINICAL DATA:  [id number] AKI (acute kidney injury) (HCC) [id number] EXAM: RENAL / URINARY TRACT ULTRASOUND COMPLETE COMPARISON:  CT 07/09/2022 FINDINGS: Right Kidney: Renal measurements: 13.1 x 7.0 x 7.6 cm = volume: 363.7 mL. Persistent moderate hydronephrosis. Left Kidney: Renal measurements: 11.9 x 7.4 x 5.6 cm = volume: 260.1 mL. Persistent moderate hydronephrosis. Bladder: Foley catheter is in place with heterogeneous hyperechoic material adjacent to the Foley balloon, which is avascular. Posterior bladder wall thickening. Other: None. IMPRESSION: Persistent moderate hydronephrosis, similar to recent CT. Intraluminal echogenic material in the bladder which could represent blood clot, correlate with urinalysis. Posterior bladder wall thickening as seen on recent CT. Electronically Signed   By: Caprice Renshaw M.D.   On: 06/15/2022 12:38   US Venous Img Lower Bilateral (DVT)  Result Date: 06/15/2022 CLINICAL DATA:  Bilateral lower extremity pain and edema, left-greater-than-right.  Former smoker. Evaluate for DVT. EXAM: BILATERAL LOWER EXTREMITY VENOUS DOPPLER ULTRASOUND TECHNIQUE: Gray-scale sonography with graded compression, as well as color Doppler and duplex ultrasound were performed to evaluate the lower extremity deep venous systems from the level of the common femoral vein and including the common femoral, femoral, profunda femoral, popliteal and calf veins including the  posterior tibial, peroneal and gastrocnemius veins when visible. The superficial great saphenous vein was also interrogated. Spectral Doppler was utilized to evaluate flow at rest and with distal augmentation maneuvers in the common femoral, femoral and popliteal veins. COMPARISON:  CT abdomen and pelvis-06/12/2022 FINDINGS: Examination is degraded due to patient body habitus and poor sonographic window RIGHT LOWER EXTREMITY Common Femoral Vein: No evidence of thrombus. Normal compressibility, respiratory phasicity and response to augmentation. Saphenofemoral Junction: No evidence of thrombus. Normal compressibility and flow on color Doppler imaging. Profunda Femoral Vein: No evidence of thrombus. Normal compressibility and flow on color Doppler imaging. Femoral Vein: No evidence of thrombus. Normal compressibility, respiratory phasicity and response to augmentation. Popliteal Vein: No evidence of thrombus. Normal compressibility, respiratory phasicity and response to augmentation. Calf Veins: No evidence of thrombus. Normal compressibility and flow on color Doppler imaging. Superficial Great Saphenous Vein: No evidence of thrombus. Normal compressibility. Other Findings:  None. LEFT LOWER EXTREMITY Common Femoral Vein: No evidence of thrombus. Normal compressibility, respiratory phasicity and response to augmentation. Saphenofemoral Junction: No evidence of thrombus. Normal compressibility and flow on color Doppler imaging. Profunda Femoral Vein: No evidence of thrombus. Normal compressibility and flow on color Doppler imaging. Femoral Vein: No evidence of thrombus. Normal compressibility, respiratory phasicity and response to augmentation. Popliteal Vein: No evidence of thrombus. Normal compressibility, respiratory phasicity and response to augmentation. Calf Veins: No evidence of thrombus. Normal compressibility and flow on color Doppler imaging. Superficial Great Saphenous Vein: No evidence of thrombus. Normal  compressibility. Other Findings: Pathologically enlarged left inguinal lymph nodes as demonstrated on preceding abdominal CT with index inguinal lymph node measuring 1.3 cm in greatest short axis diameter (image 49). There is a moderate amount of subcutaneous edema at the level of the left lower leg and calf. IMPRESSION: 1. No evidence of DVT within the left lower extremity. 2. Pathologically enlarged left inguinal lymph nodes, similar to preceding abdominal CT performed 06/19/2022. Electronically Signed   By: Simonne Come M.D.   On: 06/15/2022 10:59   DG Chest Port 1V same Day  Result Date: 06/15/2022 CLINICAL DATA:  Dyspnea EXAM: PORTABLE CHEST 1 VIEW COMPARISON:  08/15/2019, CT 06/05/2022 FINDINGS: Stable coarsening of the pulmonary interstitium. The lungs are symmetrically well inflated. Known spiculated pulmonary nodule within the peripheral right upper lobe seen on prior CT examination is not well appreciated on this exam. New retrocardiac opacity in keeping with a focal pulmonary infiltrate within this region, possibly infectious in the acute setting. No pneumothorax or pleural effusion. Cardiac size within normal limits. Central pulmonary vascular congestion without overt pulmonary edema. IMPRESSION: 1. New retrocardiac pulmonary infiltrate, possibly infectious in the acute setting. 2. Central pulmonary vascular congestion without overt pulmonary edema. 3. Known right upper lobe spiculated pulmonary nodule not well appreciated on this exam. Electronically Signed   By: Helyn Numbers M.D.   On: 06/15/2022 00:48   CT ABDOMEN PELVIS WO CONTRAST  Result Date: 06/25/2022 CLINICAL DATA:  Abdominal pain, nausea and diarrhea, history of colon cancer EXAM: CT ABDOMEN AND PELVIS WITHOUT CONTRAST TECHNIQUE: Multidetector CT imaging of the abdomen and pelvis was performed following the standard protocol without IV contrast.  Unenhanced CT was performed per clinician order. Lack of IV contrast limits sensitivity and  specificity, especially for evaluation of abdominal/pelvic solid viscera. RADIATION DOSE REDUCTION: This exam was performed according to the departmental dose-optimization program which includes automated exposure control, adjustment of the mA and/or kV according to patient size and/or use of iterative reconstruction technique. COMPARISON:  05/16/2022 FINDINGS: Lower chest: New consolidation within the posterior left lower lobe at the costophrenic angle, which could be atelectasis or acute airspace disease. No pleural effusion. Hepatobiliary: Unremarkable unenhanced appearance of the liver. The gallbladder is moderately distended, without evidence of cholelithiasis or cholecystitis. Pancreas: Unremarkable unenhanced appearance. Spleen: Unremarkable unenhanced appearance. Adrenals/Urinary Tract: Bilateral hydronephrosis is again identified, as seen on recent chest CT. There is also bilateral hydroureter to the level of the base of the bladder. There is marked posterior bladder wall thickening, measuring up to 14 mm. High attenuation material seen within the bladder surrounding the Foley catheter balloon consistent with blood products. Differential diagnosis include cystitis, chronic scarring from previous radiation therapy in a patient with a history of colon cancer, or bladder neoplasm. Urology consultation recommended. There are bilateral nonobstructing renal calculi, measuring 5 mm on the left and 3 mm on the right. Simple appearing right renal cysts do not require specific imaging follow-up. The adrenals are unremarkable. Stomach/Bowel: Postsurgical changes are seen from distal colectomy and reanastomosis. There is segmental wall thickening of the descending colon extending to the level of the anastomosis, compatible with inflammatory or infectious colitis. Moderate distension of the colon with retained gas and stool. No evidence of small-bowel obstruction. Vascular/Lymphatic: Lymphadenopathy has developed within  the bilateral inguinal regions and retroperitoneum. Index lymph node in the left inguinal region reference image 76/2 measures 15 mm in short axis. Left para-aortic adenopathy measures up to 14 mm in short axis reference image 43/2. There is extensive atherosclerosis throughout the aorta and its distal branches. Evaluation of the vascular lumen is limited without IV contrast. Reproductive: The prostate is enlarged, measuring 5.7 x 4.8 cm in cross-sectional diameter. Other: There is diffuse fat stranding in the perirectal region, surrounding the prostate, and adjacent to the wall thickening at the base of the bladder. This is nonspecific, and could be related to prior radiation therapy. There is no free intraperitoneal fluid or free intraperitoneal gas. There are fat containing midline ventral hernias. The periumbilical fat containing ventral hernia on image 53 demonstrates stranding within the herniated fat, could reflect incarceration. No bowel herniation. There is also evidence of prior ventral hernia repair. Musculoskeletal: No acute or destructive bony lesions. Stable severe right hip osteoarthritis. Reconstructed images demonstrate no additional findings. IMPRESSION: 1. Marked wall thickening of the posterior aspect of the bladder, measuring up to 14 mm. This results in severe bilateral hydroureteronephrosis. Differential diagnosis includes neoplasm, post radiation change, or cystitis. High attenuation material surrounding the Foley catheter within the bladder lumen consistent with blood products. Urology consultation and cystoscopy recommended. 2. Segmental wall thickening of the distal colon, extending from the splenic flexure through the rectosigmoid anastomosis, suspicious for underlying inflammatory or infectious colitis. 3. New retroperitoneal and bilateral inguinal lymphadenopathy. Metastatic disease not excluded. 4. New dependent consolidation within the left lower lobe posterior costophrenic angle,  consistent with pneumonia or atelectasis. 5. Fat containing midline ventral hernias. The inferior ventral hernia demonstrates stranding within the herniated fat, incarcerated hernia not excluded. No bowel herniation. 6. Enlarged prostate. 7. Nonspecific fat stranding within the lower pelvis surrounding the rectosigmoid anastomosis, prostate, and bladder base. This could be  sequela of prior radiation therapy. 8.  Aortic Atherosclerosis (ICD10-I70.0). 9. Bilateral nonobstructing renal calculi. Electronically Signed   By: Sharlet Salina M.D.   On: 06/21/2022 17:06    Assessment: 87 y.o. year old male with a history of coronary artery disease, hypertension, chronic respiratory failure on 2 L at nighttime, COPD, rectal cancer 2011 s/p chemoradiation and subsequent resection with residual carcinoma noted on path, presenting to the ED yesterday with urinary incontinence, nausea, loose stools, generalized abdominal pain, and distension. He was admitted with AKI,  bilateral hydroureteronephrosis, and colitis on CT with GI consultation requested.   Colitis:  CT A/P without contrast 5/5 with segmental wall thickening of the descending colon extending to the level of the anastomosis, moderate distention of the colon with retained gas and stool.  Reported 4-5 loose stools daily over the last few weeks without recent antibiotic exposure or sick contacts.  C. difficile negative.  And GI pathogen panel in process.  Currently on empiric antibiotics and feeling much better in regards to abdominal pain and distension. Still having loose stools.  Leukocytosis improving.  With history of colorectal cancer in 2011 and last colonoscopy in 2015, recommend colonoscopy outpatient once over acute illness.  Anemia: Hemoglobin 12.3-day of admission, 9.9 yesterday, 9.5 this morning. No overt GI bleeding. Bloody output in bilateral nephrostomy tube collection bags and foley.   Severe hydroureteronephrosis with AKI and concern for  bladder neoplasm: Urology and nephrology on board.  Had bilateral nephrostomy tubes placed yesterday. Bloody output in bilateral nephrostomy tube collection bags and foley.    Plan: Continue empiric antibiotics.  Follow-up on GI path panel.  Outpatient colonoscopy once over acute illness.    LOS: 2 days    06/16/2022, 8:15 AM   Ermalinda Memos, PA-C [facility]

## 2022-06-17 DIAGNOSIS — J9621 Acute and chronic respiratory failure with hypoxia: Secondary | ICD-10-CM | POA: Diagnosis not present

## 2022-06-17 DIAGNOSIS — I10 Essential (primary) hypertension: Secondary | ICD-10-CM

## 2022-06-17 DIAGNOSIS — D485 Neoplasm of uncertain behavior of skin: Secondary | ICD-10-CM

## 2022-06-17 DIAGNOSIS — R197 Diarrhea, unspecified: Secondary | ICD-10-CM | POA: Diagnosis not present

## 2022-06-17 DIAGNOSIS — N179 Acute kidney failure, unspecified: Secondary | ICD-10-CM | POA: Diagnosis not present

## 2022-06-17 LAB — CBC
HCT: 29.6 % — ABNORMAL LOW (ref 39.0–52.0)
Hemoglobin: 9.4 g/dL — ABNORMAL LOW (ref 13.0–17.0)
MCH: 30.7 pg (ref 26.0–34.0)
MCHC: 31.8 g/dL (ref 30.0–36.0)
MCV: 96.7 fL (ref 80.0–100.0)
Platelets: 394 10*3/uL (ref 150–400)
RBC: 3.06 MIL/uL — ABNORMAL LOW (ref 4.22–5.81)
RDW: 14.6 % (ref 11.5–15.5)
WBC: 11.8 10*3/uL — ABNORMAL HIGH (ref 4.0–10.5)
nRBC: 0 % (ref 0.0–0.2)

## 2022-06-17 LAB — CULTURE, BLOOD (ROUTINE X 2)

## 2022-06-17 LAB — RENAL FUNCTION PANEL
Albumin: 2.7 g/dL — ABNORMAL LOW (ref 3.5–5.0)
Anion gap: 13 (ref 5–15)
BUN: 87 mg/dL — ABNORMAL HIGH (ref 8–23)
CO2: 21 mmol/L — ABNORMAL LOW (ref 22–32)
Calcium: 8.2 mg/dL — ABNORMAL LOW (ref 8.9–10.3)
Chloride: 108 mmol/L (ref 98–111)
Creatinine, Ser: 5.91 mg/dL — ABNORMAL HIGH (ref 0.61–1.24)
GFR, Estimated: 9 mL/min — ABNORMAL LOW (ref >60)
Glucose, Bld: 109 mg/dL — ABNORMAL HIGH (ref 70–99)
Phosphorus: 6.3 mg/dL — ABNORMAL HIGH (ref 2.5–4.6)
Potassium: 3.8 mmol/L (ref 3.5–5.1)
Sodium: 142 mmol/L (ref 135–145)

## 2022-06-17 MED ORDER — METOPROLOL TARTRATE 25 MG PO TABS
12.5000 mg | ORAL_TABLET | Freq: Two times a day (BID) | ORAL | Status: DC
  Administered 2022-06-17 – 2022-06-19 (×5): 12.5 mg via ORAL
  Filled 2022-06-17 (×6): qty 1

## 2022-06-17 MED ORDER — SODIUM CHLORIDE 0.9 % IV BOLUS
500.0000 mL | Freq: Once | INTRAVENOUS | Status: AC
  Administered 2022-06-17: 500 mL via INTRAVENOUS

## 2022-06-17 NOTE — Progress Notes (Signed)
PROGRESS NOTE  [name] ZHY:[id number] DOB: [birth year] DOA: 06/23/2022 PCP: Verlee Monte, NP  Brief History:  87 year old male with a history of coronary artery disease, hypertension, chronic respiratory failure on 2 L at nighttime, COPD, colorectal adenocarcinoma (09/2009) status post partial colectomy (01/2010)  and chemoradiation with Xeloda.  He finished radiation 12/04/2009.  He had follow-up colonoscopy 12/2013 which was negative.  His CEA tumor markers have been normal.  He states that he has not followed up with oncology in over 5 years at this point. Nevertheless, the patient has been complaining of 2 to 3-week history of abdominal discomfort and distention with difficulty urinating.  He feels like he has incomplete bladder emptying.  He has had some nausea without emesis.  He denies any hematuria or dysuria.  He has had some urinary incontinence.  Regarding his abdominal discomfort, the patient states that it is generalized.  He has had loose stools on a daily basis for the last 2 to 3 weeks.  There is no hematochezia or melena.  He denies any new medications.  He denies any recent travels or exotic foods.  He denies any fevers, chills, chest pain, shortness breath, cough, hemoptysis.  The patient denies any NSAIDs.  In the ED, the patient had low-grade temperature of 99.2 F.  He was hemodynamically stable.  Oxygen saturation was 95% on 2 L.  WBC 15.6, hemoglobin 12.3, platelets 279,000.  LFTs were unremarkable.  Sodium 139, potassium 5.6, bicarbonate 15, serum creatinine 7.04.  UA showed 21-50 WBC. CT of the abdomen pelvis showed LLL consolidation, bilateral hydronephrosis which was already seen on a previous CT chest 06/05/2022, posterior wall bladder thickening with high attenuation material around the Foley catheter, wall thickening @descending  colon at the level of the rectosigmoid.  There is diffuse stranding in the perirectal region surrounding the prostate and bladder.   There was bilateral inguinal and retroperitoneal lymphadenopathy. The patient was admitted for further evaluation and treatment of his acute kidney injury and colitis.  A Foley catheter was inserted in the emergency department. Renal function did not improve, so nephrology and urology were consulted.     Assessment/Plan: AKI -Last serum creatinine 0.80 on 08/15/2019 -secondary to obstructive uropathy -Obtain renal ultrasound--persistent hydronephrosis -Bilateral hydronephrosis seen on CT abdomen -No improvement in serum creatinine after Foley insertion -Nephrology consult appreciated -Accurate I's and O's -Appears hypervolemic>>echo -5/7 Echo--EF 55-60, no WMA, G1DD, normal RVF -06/15/22--bilateral nephrostomy tubes placed   Hyperkalemia - treated and resolved  -Patient was started on bicarbonate drip -Given Lokelma -resolved    Colitis -Descending colon and perirectal thickening as discussed above -GI consulted and now signed off -Stool pathogen panel--neg -Stool for C. Difficile-neg -Continue empiric ceftriaxone and metronidazole x 7 days per GI service    Bilateral hydronephrosis/Bladder mass -Foley inserted--did not resolve -due to bladder mass causing obstructive uropathy -Renal ultrasound post Foley insertion--persistent hydronephrosis -06/15/22--bilateral nephrostomy tubes placed -Dr. Ronne Binning following>>will ultimately need cystoscopy  Lobar pneumonia -5/5 CT abd--shows LLL consolidation -continue ceftriaxone   Left leg cellulitis -Appears streptococcal -Venous duplex-neg -Continue ceftriaxone -improving   Essential hypertension -Restart metoprolol at lower dose -Hold HCTZ -Hold benazepril -Holding amlodipine temporarily -BP remains controlled   Acute on chronic respiratory failure with hypoxia -Chronically on 2 L at nighttime -Secondary to pulmonary edema and pneumonia -Discontinued bicarbonate drip   COPD -Continue bronchodilators   Coronary artery  disease -No chest pain presently -Continue metoprolol   Mixed hyperlipidemia -  Continue statin   Obesity -BMI 32.10 -lifestyle modification     Family Communication:  family at bedside 5/8    Consultants:  renal, urology, GI   Code Status:  FULL   DVT Prophylaxis:  Adrian Heparin     Procedures: As Listed in Progress Note Above   Antibiotics: Ceftriaxone 5/5>> Metronidazole 5/5>>   Subjective: Starting to feel better.    Objective: Vitals:   06/16/22 1357 06/16/22 2025 06/17/22 0427 06/17/22 0719  BP:  130/74 124/68   Pulse: 72 90 71   Resp: 17     Temp:  99.9 F (37.7 C) 98.8 F (37.1 C)   TempSrc:  Oral Oral   SpO2:  94% 92% 94%  Weight:      Height:        Intake/Output Summary (Last 24 hours) at 06/17/2022 1107 Last data filed at 06/17/2022 4098 Gross per 24 hour  Intake 680 ml  Output 4175 ml  Net -3495 ml   Weight change:  Exam:  General:  Pt is alert, follows commands appropriately, not in acute distress HEENT: No icterus, No thrush, No neck mass, [place]/AT Cardiovascular: normal S1/S2, no rubs, no gallops Respiratory: bibasilar rales.  No wheeze Abdomen: bilateral urostomy tubes seen with blood-tinged fluid in both bags.  R>L. Soft/+BS, non tender, non distended, no guarding Extremities: No edema, No lymphangitis, No petechiae, No rashes, no synovitis  Data Reviewed: I have personally reviewed following labs and imaging studies Basic Metabolic Panel: Recent Labs  Lab 06/21/2022 1459 07/08/2022 1846 06/15/22 0346 06/16/22 0412 06/17/22 0410  NA 139 138 139 142 142  K 5.6* 5.7* 4.8 4.4 3.8  CL 107 108 108 108 108  CO2 15* 13* 16* 20* 21*  GLUCOSE 104* 106* 134* 101* 109*  BUN 105* 105* 104* 97* 87*  CREATININE 7.04* 7.06* 7.04* 6.77* 5.91*  CALCIUM 9.0 8.6* 8.5* 8.4* 8.2*  PHOS  --   --   --  7.6* 6.3*   Liver Function Tests: Recent Labs  Lab 06/23/2022 1459 06/15/22 0346 06/16/22 0412 06/17/22 0410  AST 13* 9*  --   --   ALT 11 9  --   --    ALKPHOS 63 45  --   --   BILITOT 0.9 0.6  --   --   PROT 7.3 6.2*  --   --   ALBUMIN 3.5 2.8* 2.7* 2.7*   Recent Labs  Lab 07/03/2022 1459  LIPASE 30   No results for input(s): "AMMONIA" in the last 168 hours. Coagulation Profile: Recent Labs  Lab 06/15/22 1352  INR 1.2   CBC: Recent Labs  Lab 06/13/2022 1459 06/15/22 0346 06/16/22 0412 06/17/22 0410  WBC 15.6* 14.2* 13.1* 11.8*  HGB 12.3* 9.9* 9.5* 9.4*  HCT 37.9* 30.8* 29.2* 29.6*  MCV 96.7 96.6 96.4 96.7  PLT 461* 379 383 394   Cardiac Enzymes: No results for input(s): "CKTOTAL", "CKMB", "CKMBINDEX", "TROPONINI" in the last 168 hours. BNP: Invalid input(s): "POCBNP" CBG: No results for input(s): "GLUCAP" in the last 168 hours. HbA1C: No results for input(s): "HGBA1C" in the last 72 hours. Urine analysis:    Component Value Date/Time   COLORURINE YELLOW 06/23/2022 1455   APPEARANCEUR CLOUDY (A) 06/19/2022 1455   LABSPEC 1.011 07/05/2022 1455   PHURINE 5.0 06/21/2022 1455   GLUCOSEU NEGATIVE 06/28/2022 1455   HGBUR LARGE (A) 06/11/2022 1455   BILIRUBINUR NEGATIVE 06/10/2022 1455   KETONESUR 5 (A) 06/11/2022 1455   PROTEINUR 100 (A) 06/10/2022 1455  NITRITE NEGATIVE 07/05/2022 1455   LEUKOCYTESUR MODERATE (A) 06/12/2022 1455   Recent Results (from the past 240 hour(s))  Blood culture (routine x 2)     Status: None (Preliminary result)   Collection Time: 07/01/2022  2:59 PM   Specimen: Right Antecubital; Blood  Result Value Ref Range Status   Specimen Description   Final    RIGHT ANTECUBITAL BOTTLES DRAWN AEROBIC AND ANAEROBIC   Special Requests   Final    Blood Culture results may not be optimal due to an excessive volume of blood received in culture bottles   Culture   Final    NO GROWTH 3 DAYS Performed at [facility], [address], [place], [place] [address]    Report Status PENDING  Incomplete  Blood culture (routine x 2)     Status: Abnormal   Collection Time: 06/18/2022  3:12 PM   Specimen:  Left Antecubital; Blood  Result Value Ref Range Status   Specimen Description   Final    LEFT ANTECUBITAL BOTTLES DRAWN AEROBIC AND ANAEROBIC Performed at [facility], [address] [place], [place] [address]    Special Requests   Final    Blood Culture results may not be optimal due to an excessive volume of blood received in culture bottles Performed at [facility], [address], [place], [place] [address]    Culture  Setup Time   Final    GRAM POSITIVE COCCI Gram Stain Report Called to,Read Back By and Verified With: M. CATES LAND @ 06/15/22 ANAEROBIC BOTTLE ONLY CRITICAL RESULT CALLED TO, READ BACK BY AND VERIFIED WITH: PHARMD LAURIE POOLE ON 06/15/22 @ 1715 BY DRT    Culture (A)  Final    AEROCOCCUS VIRIDANS Standardized susceptibility testing for this organism is not available. Performed at [facility], [address] [place], [place] [address]    Report Status 06/17/2022 FINAL  Final  Blood Culture ID Panel (Reflexed)     Status: None   Collection Time: 07/03/2022  3:12 PM  Result Value Ref Range Status   Enterococcus faecalis NOT DETECTED NOT DETECTED Final   Enterococcus Faecium NOT DETECTED NOT DETECTED Final   Listeria monocytogenes NOT DETECTED NOT DETECTED Final   Staphylococcus species NOT DETECTED NOT DETECTED Final   Staphylococcus aureus (BCID) NOT DETECTED NOT DETECTED Final   Staphylococcus epidermidis NOT DETECTED NOT DETECTED Final   Staphylococcus lugdunensis NOT DETECTED NOT DETECTED Final   Streptococcus species NOT DETECTED NOT DETECTED Final   Streptococcus agalactiae NOT DETECTED NOT DETECTED Final   Streptococcus pneumoniae NOT DETECTED NOT DETECTED Final   Streptococcus pyogenes NOT DETECTED NOT DETECTED Final   A.calcoaceticus-baumannii NOT DETECTED NOT DETECTED Final   Bacteroides fragilis NOT DETECTED NOT DETECTED Final   Enterobacterales NOT DETECTED NOT DETECTED Final   Enterobacter cloacae complex NOT DETECTED NOT DETECTED Final    Escherichia coli NOT DETECTED NOT DETECTED Final   Klebsiella aerogenes NOT DETECTED NOT DETECTED Final   Klebsiella oxytoca NOT DETECTED NOT DETECTED Final   Klebsiella pneumoniae NOT DETECTED NOT DETECTED Final   Proteus species NOT DETECTED NOT DETECTED Final   Salmonella species NOT DETECTED NOT DETECTED Final   Serratia marcescens NOT DETECTED NOT DETECTED Final   Haemophilus influenzae NOT DETECTED NOT DETECTED Final   Neisseria meningitidis NOT DETECTED NOT DETECTED Final   Pseudomonas aeruginosa NOT DETECTED NOT DETECTED Final   Stenotrophomonas maltophilia NOT DETECTED NOT DETECTED Final   Candida albicans NOT DETECTED NOT DETECTED Final  Candida auris NOT DETECTED NOT DETECTED Final   Candida glabrata NOT DETECTED NOT DETECTED Final   Candida krusei NOT DETECTED NOT DETECTED Final   Candida parapsilosis NOT DETECTED NOT DETECTED Final   Candida tropicalis NOT DETECTED NOT DETECTED Final   Cryptococcus neoformans/gattii NOT DETECTED NOT DETECTED Final    Comment: Performed at [facility], [address] [place], [place] [address]  Urine Culture     Status: Abnormal   Collection Time: 06/30/2022  4:05 PM   Specimen: Urine, Clean Catch  Result Value Ref Range Status   Specimen Description   Final    URINE, CLEAN CATCH Performed at [facility], [address] [place], [place] [address]    Special Requests   Final    NONE Performed at [facility], [address], [place], [place] [address]    Culture MULTIPLE SPECIES PRESENT, SUGGEST RECOLLECTION (A)  Final   Report Status 06/15/2022 FINAL  Final  Gastrointestinal Panel by PCR , Stool     Status: None   Collection Time: 06/15/22  2:14 PM   Specimen: STOOL  Result Value Ref Range Status   Campylobacter species NOT DETECTED NOT DETECTED Final   Plesimonas shigelloides NOT DETECTED NOT DETECTED Final   Salmonella species NOT DETECTED NOT DETECTED Final   Yersinia enterocolitica NOT DETECTED NOT DETECTED Final    Vibrio species NOT DETECTED NOT DETECTED Final   Vibrio cholerae NOT DETECTED NOT DETECTED Final   Enteroaggregative E coli (EAEC) NOT DETECTED NOT DETECTED Final   Enteropathogenic E coli (EPEC) NOT DETECTED NOT DETECTED Final   Enterotoxigenic E coli (ETEC) NOT DETECTED NOT DETECTED Final   Shiga like toxin producing E coli (STEC) NOT DETECTED NOT DETECTED Final   Shigella/Enteroinvasive E coli (EIEC) NOT DETECTED NOT DETECTED Final   Cryptosporidium NOT DETECTED NOT DETECTED Final   Cyclospora cayetanensis NOT DETECTED NOT DETECTED Final   Entamoeba histolytica NOT DETECTED NOT DETECTED Final   Giardia lamblia NOT DETECTED NOT DETECTED Final   Adenovirus F40/41 NOT DETECTED NOT DETECTED Final   Astrovirus NOT DETECTED NOT DETECTED Final   Norovirus GI/GII NOT DETECTED NOT DETECTED Final   Rotavirus A NOT DETECTED NOT DETECTED Final   Sapovirus (I, II, IV, and V) NOT DETECTED NOT DETECTED Final    Comment: Performed at [facility], [address] [place], [place] [address]  C Difficile Quick Screen w PCR reflex     Status: None   Collection Time: 06/15/22  2:14 PM   Specimen: STOOL  Result Value Ref Range Status   C Diff antigen NEGATIVE NEGATIVE Final   C Diff toxin NEGATIVE NEGATIVE Final   C Diff interpretation No C. difficile detected.  Final    Comment: Performed at [facility], [address] [place], [place] [address]     Scheduled Meds:  Chlorhexidine Gluconate Cloth  6 each Topical Daily   Gerhardt's butt cream   Topical BID   heparin  5,000 Units Subcutaneous Q12H   ipratropium-albuterol  3 mL Nebulization TID   metoprolol tartrate  25 mg Oral BID   Continuous Infusions:  cefTRIAXone (ROCEPHIN)  IV 1 g (06/16/22 1812)   cefTRIAXone (ROCEPHIN)  IV     metronidazole 500 mg (06/17/22 0851)    Procedures/Studies: ECHOCARDIOGRAM COMPLETE  Result Date: 06/16/2022    ECHOCARDIOGRAM REPORT   Patient Name:   SABASTIEN NIENOW Date of Exam: 06/16/2022 Medical Rec #:   [id number]     Height:  74.0 in Accession #:    [id number]    Weight:       250.0 lb Date of Birth:  14-Dec-1935      BSA:          2.390 m Patient Age:    87 years      BP:           144/75 mmHg Patient Gender: M             HR:           94 bpm. [facility] Procedure: 2D Echo, Cardiac Doppler, Color Doppler and Intracardiac            Opacification Agent Indications:    CHF-Acute Diastolic I50.31  History:        Patient has prior history of Echocardiogram examinations, most                 recent 11/19/2018. CAD, Signs/Symptoms:Shortness of Breath; Risk                 Factors:Hypertension, Dyslipidemia and Former Smoker.  Sonographer:    Aron Baba Referring Phys: [id number] DAVID TAT  Sonographer Comments: Technically difficult study due to poor echo windows. Image acquisition challenging due to COPD. IMPRESSIONS  1. Left ventricular ejection fraction, by estimation, is 55 to 60%. The left ventricle has normal function. The left ventricle has no regional wall motion abnormalities. There is mild left ventricular hypertrophy. Left ventricular diastolic parameters are consistent with Grade I diastolic dysfunction (impaired relaxation).  2. RV not well visualized. Grossly appears normal in size and function. . Right ventricular systolic function was not well visualized. The right ventricular size is not well visualized. There is normal pulmonary artery systolic pressure.  3. Left atrial size was mildly dilated.  4. The mitral valve is normal in structure. No evidence of mitral valve regurgitation. No evidence of mitral stenosis.  5. The aortic valve is tricuspid. There is moderate calcification of the aortic valve. There is moderate thickening of the aortic valve. Aortic valve regurgitation is not visualized. No aortic stenosis is present.  6. There is mild dilatation of the ascending aorta, measuring 43 mm.  7. The inferior vena cava is normal in size with greater than 50% respiratory variability,  suggesting right atrial pressure of 3 mmHg. FINDINGS  Left Ventricle: Left ventricular ejection fraction, by estimation, is 55 to 60%. The left ventricle has normal function. The left ventricle has no regional wall motion abnormalities. Definity contrast agent was given IV to delineate the left ventricular  endocardial borders. The left ventricular internal cavity size was normal in size. There is mild left ventricular hypertrophy. Left ventricular diastolic parameters are consistent with Grade I diastolic dysfunction (impaired relaxation). Normal left ventricular filling pressure. Right Ventricle: RV not well visualized. Grossly appears normal in size and function. The right ventricular size is not well visualized. Right vetricular wall thickness was not well visualized. Right ventricular systolic function was not well visualized.  There is normal pulmonary artery systolic pressure. The tricuspid regurgitant velocity is 1.62 m/s, and with an assumed right atrial pressure of 3 mmHg, the estimated right ventricular systolic pressure is 13.5 mmHg. Left Atrium: Left atrial size was mildly dilated. Right Atrium: Right atrial size was normal in size. Pericardium: There is no evidence of pericardial effusion. Mitral Valve: The mitral valve is normal in structure. There is mild thickening of the mitral valve leaflet(s). There is mild calcification of the mitral  valve leaflet(s). Mild mitral annular calcification. No evidence of mitral valve regurgitation. No evidence of mitral valve stenosis. Tricuspid Valve: The tricuspid valve is normal in structure. Tricuspid valve regurgitation is not demonstrated. No evidence of tricuspid stenosis. Aortic Valve: The aortic valve is tricuspid. There is moderate calcification of the aortic valve. There is moderate thickening of the aortic valve. There is moderate aortic valve annular calcification. Aortic valve regurgitation is not visualized. No aortic stenosis is present. Aortic valve  mean gradient measures 9.0 mmHg. Aortic valve peak gradient measures 17.6 mmHg. Aortic valve area, by VTI measures 2.99 cm. Pulmonic Valve: The pulmonic valve was not well visualized. Pulmonic valve regurgitation is not visualized. No evidence of pulmonic stenosis. Aorta: The aortic root is normal in size and structure. There is mild dilatation of the ascending aorta, measuring 43 mm. Venous: The inferior vena cava is normal in size with greater than 50% respiratory variability, suggesting right atrial pressure of 3 mmHg. IAS/Shunts: No atrial level shunt detected by color flow Doppler.  LEFT VENTRICLE PLAX 2D LVIDd:         4.10 cm   Diastology LVIDs:         3.00 cm   LV e' medial:    4.88 cm/s LV PW:         1.30 cm   LV E/e' medial:  15.5 LV IVS:        1.10 cm   LV e' lateral:   9.67 cm/s LVOT diam:     2.40 cm   LV E/e' lateral: 7.8 LV SV:         102 LV SV Index:   43 LVOT Area:     4.52 cm  LEFT ATRIUM           Index        RIGHT ATRIUM           Index LA diam:      4.00 cm 1.67 cm/m   RA Area:     17.10 cm LA Vol (A2C): 77.0 ml 32.22 ml/m  RA Volume:   32.70 ml  13.68 ml/m LA Vol (A4C): 92.1 ml 38.54 ml/m  AORTIC VALVE                     PULMONIC VALVE AV Area (Vmax):    2.95 cm      PR End Diast Vel: 6.97 msec AV Area (Vmean):   2.70 cm AV Area (VTI):     2.99 cm AV Vmax:           210.00 cm/s AV Vmean:          137.500 cm/s AV VTI:            0.340 m AV Peak Grad:      17.6 mmHg AV Mean Grad:      9.0 mmHg LVOT Vmax:         137.00 cm/s LVOT Vmean:        82.000 cm/s LVOT VTI:          0.225 m LVOT/AV VTI ratio: 0.66  AORTA Ao Root diam: 3.80 cm Ao Asc diam:  4.30 cm MITRAL VALVE                TRICUSPID VALVE MV Area (PHT): 2.84 cm     TR Peak grad:   10.5 mmHg MV Decel Time: 267 msec     TR Vmax:  162.00 cm/s MV E velocity: 75.70 cm/s MV A velocity: 107.00 cm/s  SHUNTS MV E/A ratio:  0.71         Systemic VTI:  0.22 m                             Systemic Diam: 2.40 cm Dina Rich  MD Electronically signed by Dina Rich MD Signature Date/Time: 06/16/2022/12:06:51 PM    Final    IR NEPHROSTOMY PLACEMENT LEFT  Result Date: 06/15/2022 INDICATION: 87 year old male with bilateral hydronephrosis and acute renal failure. He presents for bilateral percutaneous nephrostomy tube placement. EXAM: IR NEPHROSTOMY PLACEMENT LEFT; IR NEPHROSTOMY PLACEMENT RIGHT COMPARISON:  None Available. MEDICATIONS: Rocephin 2 g IV; The antibiotic was administered in an appropriate time frame prior to skin puncture. ANESTHESIA/SEDATION: Fentanyl 100 mcg IV; Versed 2 mg IV Moderate Sedation Time:  25 minutes The patient's vital signs and level of consciousness were continuously monitored during the procedure by the interventional radiology nurse under my direct supervision. CONTRAST:  20mL OMNIPAQUE IOHEXOL 300 MG/ML SOLN - administered into the collecting system(s) FLUOROSCOPY: Fluoroscopy Time:  minutes  seconds ( mGy). COMPLICATIONS: None immediate. TECHNIQUE: The procedure, risks, benefits, and alternatives were explained to the patient. Questions regarding the procedure were encouraged and answered. The patient understands and consents to the procedure. LEFT The left flank was prepped with chlorhexidine in a sterile fashion, and a sterile drape was applied covering the operative field. A sterile gown and sterile gloves were used for the procedure. Local anesthesia was provided with 1% Lidocaine. The left flank was interrogated with ultrasound and the left kidney identified. The kidney is hydronephrotic. A suitable access site on the skin overlying the lower pole, posterior calix was identified. After local mg anesthesia was achieved, a small skin nick was made with an 11 blade scalpel. A 21 gauge Accustick needle was then advanced under direct sonographic guidance into the lower pole of the left kidney. A 0.018 inch wire was advanced under fluoroscopic guidance into the left renal collecting system. The  Accustick sheath was then advanced over the wire and a 0.018 system exchanged for a 0.035 system. Gentle hand injection of contrast material confirms placement of the sheath within the renal collecting system. There is severe hydronephrosis. The tract from the scan into the renal collecting system was then dilated serially to 10-French. A 10-French Cook all-purpose drain was then placed and positioned under fluoroscopic guidance. The locking loop is well formed within the left renal pelvis. The catheter was secured to the skin with 2-0 Prolene and a sterile bandage was placed. Catheter was left to gravity bag drainage. RIGHT The right flank was prepped with chlorhexidine in a sterile fashion, and a sterile drape was applied covering the operative field. A sterile gown and sterile gloves were used for the procedure. Local anesthesia was provided with 1% Lidocaine. The right flank was interrogated with ultrasound and the left kidney identified. The kidney is hydronephrotic. There are multiple large cysts overlying the posterior calices. A suitable access site on the skin overlying the lower pole, posterior calix was identified. After local anesthesia was achieved, a small skin nick was made with an 11 blade scalpel. A 21 gauge Accustick needle was then advanced under direct sonographic guidance into the cyst. Aspiration was performed. The cyst was decompressed. Next, the Accustick needle was advanced into the calyx within the lower pole of the right kidney. A 0.018 inch wire was advanced  under fluoroscopic guidance into the left renal collecting system. The Accustick sheath was then advanced over the wire and a 0.018 system exchanged for a 0.035 system. Gentle hand injection of contrast material confirms placement of the sheath within the renal collecting system. There is severe hydronephrosis. The tract from the scan into the renal collecting system was then dilated serially to 10-French. A 10-French Cook all-purpose  drain was then placed and positioned under fluoroscopic guidance. The locking loop is well formed within the left renal pelvis. The catheter was secured to the skin with 2-0 Prolene and a sterile bandage was placed. Catheter was left to gravity bag drainage. IMPRESSION: Successful placement of a bilateral 10 French percutaneous nephrostomy tubes. Electronically Signed   By: Malachy Moan M.D.   On: 06/15/2022 17:34   IR NEPHROSTOMY PLACEMENT RIGHT  Result Date: 06/15/2022 INDICATION: 87 year old male with bilateral hydronephrosis and acute renal failure. He presents for bilateral percutaneous nephrostomy tube placement. EXAM: IR NEPHROSTOMY PLACEMENT LEFT; IR NEPHROSTOMY PLACEMENT RIGHT COMPARISON:  None Available. MEDICATIONS: Rocephin 2 g IV; The antibiotic was administered in an appropriate time frame prior to skin puncture. ANESTHESIA/SEDATION: Fentanyl 100 mcg IV; Versed 2 mg IV Moderate Sedation Time:  25 minutes The patient's vital signs and level of consciousness were continuously monitored during the procedure by the interventional radiology nurse under my direct supervision. CONTRAST:  20mL OMNIPAQUE IOHEXOL 300 MG/ML SOLN - administered into the collecting system(s) FLUOROSCOPY: Fluoroscopy Time:  minutes  seconds ( mGy). COMPLICATIONS: None immediate. TECHNIQUE: The procedure, risks, benefits, and alternatives were explained to the patient. Questions regarding the procedure were encouraged and answered. The patient understands and consents to the procedure. LEFT The left flank was prepped with chlorhexidine in a sterile fashion, and a sterile drape was applied covering the operative field. A sterile gown and sterile gloves were used for the procedure. Local anesthesia was provided with 1% Lidocaine. The left flank was interrogated with ultrasound and the left kidney identified. The kidney is hydronephrotic. A suitable access site on the skin overlying the lower pole, posterior calix was  identified. After local mg anesthesia was achieved, a small skin nick was made with an 11 blade scalpel. A 21 gauge Accustick needle was then advanced under direct sonographic guidance into the lower pole of the left kidney. A 0.018 inch wire was advanced under fluoroscopic guidance into the left renal collecting system. The Accustick sheath was then advanced over the wire and a 0.018 system exchanged for a 0.035 system. Gentle hand injection of contrast material confirms placement of the sheath within the renal collecting system. There is severe hydronephrosis. The tract from the scan into the renal collecting system was then dilated serially to 10-French. A 10-French Cook all-purpose drain was then placed and positioned under fluoroscopic guidance. The locking loop is well formed within the left renal pelvis. The catheter was secured to the skin with 2-0 Prolene and a sterile bandage was placed. Catheter was left to gravity bag drainage. RIGHT The right flank was prepped with chlorhexidine in a sterile fashion, and a sterile drape was applied covering the operative field. A sterile gown and sterile gloves were used for the procedure. Local anesthesia was provided with 1% Lidocaine. The right flank was interrogated with ultrasound and the left kidney identified. The kidney is hydronephrotic. There are multiple large cysts overlying the posterior calices. A suitable access site on the skin overlying the lower pole, posterior calix was identified. After local anesthesia was achieved, a small skin  nick was made with an 11 blade scalpel. A 21 gauge Accustick needle was then advanced under direct sonographic guidance into the cyst. Aspiration was performed. The cyst was decompressed. Next, the Accustick needle was advanced into the calyx within the lower pole of the right kidney. A 0.018 inch wire was advanced under fluoroscopic guidance into the left renal collecting system. The Accustick sheath was then advanced over  the wire and a 0.018 system exchanged for a 0.035 system. Gentle hand injection of contrast material confirms placement of the sheath within the renal collecting system. There is severe hydronephrosis. The tract from the scan into the renal collecting system was then dilated serially to 10-French. A 10-French Cook all-purpose drain was then placed and positioned under fluoroscopic guidance. The locking loop is well formed within the left renal pelvis. The catheter was secured to the skin with 2-0 Prolene and a sterile bandage was placed. Catheter was left to gravity bag drainage. IMPRESSION: Successful placement of a bilateral 10 French percutaneous nephrostomy tubes. Electronically Signed   By: Malachy Moan M.D.   On: 06/15/2022 17:34   US RENAL  Result Date: 06/15/2022 CLINICAL DATA:  [id number] AKI (acute kidney injury) (HCC) [id number] EXAM: RENAL / URINARY TRACT ULTRASOUND COMPLETE COMPARISON:  CT  FINDINGS: Right Kidney: Renal measurements: 13.1 x 7.0 x 7.6 cm = volume: 363.7 mL. Persistent moderate hydronephrosis. Left Kidney: Renal measurements: 11.9 x 7.4 x 5.6 cm = volume: 260.1 mL. Persistent moderate hydronephrosis. Bladder: Foley catheter is in place with heterogeneous hyperechoic material adjacent to the Foley balloon, which is avascular. Posterior bladder wall thickening. Other: None. IMPRESSION: Persistent moderate hydronephrosis, similar to recent CT. Intraluminal echogenic material in the bladder which could represent blood clot, correlate with urinalysis. Posterior bladder wall thickening as seen on recent CT. Electronically Signed   By: Caprice Renshaw M.D.   On: 06/15/2022 12:38   US Venous Img Lower Bilateral (DVT)  Result Date: 06/15/2022 CLINICAL DATA:  Bilateral lower extremity pain and edema, left-greater-than-right. Former smoker. Evaluate for DVT. EXAM: BILATERAL LOWER EXTREMITY VENOUS DOPPLER ULTRASOUND TECHNIQUE: Gray-scale sonography with graded compression, as well as color  Doppler and duplex ultrasound were performed to evaluate the lower extremity deep venous systems from the level of the common femoral vein and including the common femoral, femoral, profunda femoral, popliteal and calf veins including the posterior tibial, peroneal and gastrocnemius veins when visible. The superficial great saphenous vein was also interrogated. Spectral Doppler was utilized to evaluate flow at rest and with distal augmentation maneuvers in the common femoral, femoral and popliteal veins. COMPARISON:  CT abdomen and pelvis-07/04/2022 FINDINGS: Examination is degraded due to patient body habitus and poor sonographic window RIGHT LOWER EXTREMITY Common Femoral Vein: No evidence of thrombus. Normal compressibility, respiratory phasicity and response to augmentation. Saphenofemoral Junction: No evidence of thrombus. Normal compressibility and flow on color Doppler imaging. Profunda Femoral Vein: No evidence of thrombus. Normal compressibility and flow on color Doppler imaging. Femoral Vein: No evidence of thrombus. Normal compressibility, respiratory phasicity and response to augmentation. Popliteal Vein: No evidence of thrombus. Normal compressibility, respiratory phasicity and response to augmentation. Calf Veins: No evidence of thrombus. Normal compressibility and flow on color Doppler imaging. Superficial Great Saphenous Vein: No evidence of thrombus. Normal compressibility. Other Findings:  None. LEFT LOWER EXTREMITY Common Femoral Vein: No evidence of thrombus. Normal compressibility, respiratory phasicity and response to augmentation. Saphenofemoral Junction: No evidence of thrombus. Normal compressibility and flow on color Doppler imaging. Profunda Femoral Vein: No evidence  of thrombus. Normal compressibility and flow on color Doppler imaging. Femoral Vein: No evidence of thrombus. Normal compressibility, respiratory phasicity and response to augmentation. Popliteal Vein: No evidence of thrombus.  Normal compressibility, respiratory phasicity and response to augmentation. Calf Veins: No evidence of thrombus. Normal compressibility and flow on color Doppler imaging. Superficial Great Saphenous Vein: No evidence of thrombus. Normal compressibility. Other Findings: Pathologically enlarged left inguinal lymph nodes as demonstrated on preceding abdominal CT with index inguinal lymph node measuring 1.3 cm in greatest short axis diameter (image 49). There is a moderate amount of subcutaneous edema at the level of the left lower leg and calf. IMPRESSION: 1. No evidence of DVT within the left lower extremity. 2. Pathologically enlarged left inguinal lymph nodes, similar to preceding abdominal CT performed 06/28/2022. Electronically Signed   By: Simonne Come M.D.   On: 06/15/2022 10:59   DG Chest Port 1V same Day  Result Date: 06/15/2022 CLINICAL DATA:  Dyspnea EXAM: PORTABLE CHEST 1 VIEW COMPARISON:  08/15/2019, CT 06/05/2022 FINDINGS: Stable coarsening of the pulmonary interstitium. The lungs are symmetrically well inflated. Known spiculated pulmonary nodule within the peripheral right upper lobe seen on prior CT examination is not well appreciated on this exam. New retrocardiac opacity in keeping with a focal pulmonary infiltrate within this region, possibly infectious in the acute setting. No pneumothorax or pleural effusion. Cardiac size within normal limits. Central pulmonary vascular congestion without overt pulmonary edema. IMPRESSION: 1. New retrocardiac pulmonary infiltrate, possibly infectious in the acute setting. 2. Central pulmonary vascular congestion without overt pulmonary edema. 3. Known right upper lobe spiculated pulmonary nodule not well appreciated on this exam. Electronically Signed   By: Helyn Numbers M.D.   On: 06/15/2022 00:48   CT ABDOMEN PELVIS WO CONTRAST  Result Date: 06/16/2022 CLINICAL DATA:  Abdominal pain, nausea and diarrhea, history of colon cancer EXAM: CT ABDOMEN AND PELVIS  WITHOUT CONTRAST TECHNIQUE: Multidetector CT imaging of the abdomen and pelvis was performed following the standard protocol without IV contrast. Unenhanced CT was performed per clinician order. Lack of IV contrast limits sensitivity and specificity, especially for evaluation of abdominal/pelvic solid viscera. RADIATION DOSE REDUCTION: This exam was performed according to the departmental dose-optimization program which includes automated exposure control, adjustment of the mA and/or kV according to patient size and/or use of iterative reconstruction technique. COMPARISON:  05/16/2022 FINDINGS: Lower chest: New consolidation within the posterior left lower lobe at the costophrenic angle, which could be atelectasis or acute airspace disease. No pleural effusion. Hepatobiliary: Unremarkable unenhanced appearance of the liver. The gallbladder is moderately distended, without evidence of cholelithiasis or cholecystitis. Pancreas: Unremarkable unenhanced appearance. Spleen: Unremarkable unenhanced appearance. Adrenals/Urinary Tract: Bilateral hydronephrosis is again identified, as seen on recent chest CT. There is also bilateral hydroureter to the level of the base of the bladder. There is marked posterior bladder wall thickening, measuring up to 14 mm. High attenuation material seen within the bladder surrounding the Foley catheter balloon consistent with blood products. Differential diagnosis include cystitis, chronic scarring from previous radiation therapy in a patient with a history of colon cancer, or bladder neoplasm. Urology consultation recommended. There are bilateral nonobstructing renal calculi, measuring 5 mm on the left and 3 mm on the right. Simple appearing right renal cysts do not require specific imaging follow-up. The adrenals are unremarkable. Stomach/Bowel: Postsurgical changes are seen from distal colectomy and reanastomosis. There is segmental wall thickening of the descending colon extending to  the level of the anastomosis, compatible with inflammatory or  infectious colitis. Moderate distension of the colon with retained gas and stool. No evidence of small-bowel obstruction. Vascular/Lymphatic: Lymphadenopathy has developed within the bilateral inguinal regions and retroperitoneum. Index lymph node in the left inguinal region reference image 76/2 measures 15 mm in short axis. Left para-aortic adenopathy measures up to 14 mm in short axis reference image 43/2. There is extensive atherosclerosis throughout the aorta and its distal branches. Evaluation of the vascular lumen is limited without IV contrast. Reproductive: The prostate is enlarged, measuring 5.7 x 4.8 cm in cross-sectional diameter. Other: There is diffuse fat stranding in the perirectal region, surrounding the prostate, and adjacent to the wall thickening at the base of the bladder. This is nonspecific, and could be related to prior radiation therapy. There is no free intraperitoneal fluid or free intraperitoneal gas. There are fat containing midline ventral hernias. The periumbilical fat containing ventral hernia on image 53 demonstrates stranding within the herniated fat, could reflect incarceration. No bowel herniation. There is also evidence of prior ventral hernia repair. Musculoskeletal: No acute or destructive bony lesions. Stable severe right hip osteoarthritis. Reconstructed images demonstrate no additional findings. IMPRESSION: 1. Marked wall thickening of the posterior aspect of the bladder, measuring up to 14 mm. This results in severe bilateral hydroureteronephrosis. Differential diagnosis includes neoplasm, post radiation change, or cystitis. High attenuation material surrounding the Foley catheter within the bladder lumen consistent with blood products. Urology consultation and cystoscopy recommended. 2. Segmental wall thickening of the distal colon, extending from the splenic flexure through the rectosigmoid anastomosis,  suspicious for underlying inflammatory or infectious colitis. 3. New retroperitoneal and bilateral inguinal lymphadenopathy. Metastatic disease not excluded. 4. New dependent consolidation within the left lower lobe posterior costophrenic angle, consistent with pneumonia or atelectasis. 5. Fat containing midline ventral hernias. The inferior ventral hernia demonstrates stranding within the herniated fat, incarcerated hernia not excluded. No bowel herniation. 6. Enlarged prostate. 7. Nonspecific fat stranding within the lower pelvis surrounding the rectosigmoid anastomosis, prostate, and bladder base. This could be sequela of prior radiation therapy. 8.  Aortic Atherosclerosis (ICD10-I70.0). 9. Bilateral nonobstructing renal calculi. Electronically Signed   By: Sharlet Salina M.D.   On: 06/16/2022 17:06   CT Chest Wo Contrast  Result Date: 06/09/2022 CLINICAL DATA:  History of colon cancer. Status post chemotherapy and radiation. Lung nodules. EXAM: CT CHEST WITHOUT CONTRAST TECHNIQUE: Multidetector CT imaging of the chest was performed following the standard protocol without IV contrast. RADIATION DOSE REDUCTION: This exam was performed according to the departmental dose-optimization program which includes automated exposure control, adjustment of the mA and/or kV according to patient size and/or use of iterative reconstruction technique. COMPARISON:  CT 09/26/2021 and older FINDINGS: Cardiovascular: Heart is nonenlarged. No pericardial effusion. Coronary artery calcifications are seen. The thoracic aorta has some scattered vascular calcifications. Tortuous course of the descending thoracic aorta. Mediastinum/Nodes: Small hiatal hernia. Normal caliber thoracic esophagus. The esophagus is slightly patulous. Heterogeneous thyroid gland. No specific abnormal lymph node enlargement identified in the axillary region or hilum on this noncontrast examination. There are some small mediastinal nodes, nonpathologic by  size criteria. This includes retrocrural. Example series 2, image 132 right retrocrural measures 12 x 9 mm today and previously 12 x 8 mm. Lungs/Pleura: Breathing motion. Dependent atelectasis. No consolidation, pneumothorax or effusion. Upper lung zone centrilobular emphysematous lung changes are identified. Apical pleural thickening on the left-greater-than-right. Spiculated semi-solid nodule seen in the right upper lobe on the prior that measured 16 mm overall, today on series 2, image 62  again measures 16 by 10 mm, unchanged when measured in the same fashion. The solid component more superior the was measured at 9 mm is stable. On the study of December 2021, the lesion is remeasured in the same fashion as today and at that time would have measured 15 x 9 mm. Very similar. Left upper lobe 5 mm nodule at the apex is stable today on series 2, image 32. This lesion has been stable since December 2021. Upper Abdomen: In the upper abdomen the adrenal glands are diffusely thickened and nodular. New collecting system dilatation of the kidneys. Etiology is uncertain. Musculoskeletal: Diffuse degenerative changes seen along the spine. IMPRESSION: Bilateral lung nodule seen previously are similar today going back to a study of December 2021. Long-term stability. No specific additional imaging follow-up of the lung nodules. Underlying emphysematous lung changes. However there is new collecting system dilatation of the kidneys at the edge of the imaging field of uncertain etiology. Recommend dedicated abdominal imaging to confirm etiology and correlate with known history. Findings will be called to the ordering service by the Radiology physician assistant team Aortic Atherosclerosis (ICD10-I70.0) and Emphysema (ICD10-J43.9). Electronically Signed   By: Karen Kays M.D.   On: 06/09/2022 15:15    Standley Dakins, MD   [facility]  If 7PM-7AM, please contact night-coverage [email or web address] Password [email or web address] 06/17/2022,  11:07 AM   LOS: 3 days

## 2022-06-17 NOTE — Progress Notes (Signed)
Patient ID: [name], male   DOB: 1935/10/06, 87 y.o.   MRN: [id number] S: Feels better today. O:BP 124/68 (BP Location: Left Arm)   Pulse 71   Temp 98.8 F (37.1 C) (Oral)   Resp 17   Ht 6\' 2"  (1.88 m)   Wt 113.4 kg   SpO2 94%   BMI 32.10 kg/m   Intake/Output Summary (Last 24 hours) at 06/17/2022 1245 Last data filed at 06/17/2022 0930 Gross per 24 hour  Intake 1180 ml  Output 4175 ml  Net -2995 ml   Intake/Output: I/O last 3 completed shifts: In: 1313.2 [P.O.:1080; IV Piggyback:233.2] Out: 7675 [Urine:7675]  Intake/Output this shift:  Total I/O In: 600 [P.O.:500; IV Piggyback:100] Out: 400 [Urine:400] Weight change:  Gen: NAD CVS: RRR  Resp:CTA Abd: +BS, distended, nontender Ext: 2+ edema LLE with bandage wrapping in place  Recent Labs  Lab 07/07/2022 1459 06/21/2022 1846 06/15/22 0346 06/16/22 0412 06/17/22 0410  NA 139 138 139 142 142  K 5.6* 5.7* 4.8 4.4 3.8  CL 107 108 108 108 108  CO2 15* 13* 16* 20* 21*  GLUCOSE 104* 106* 134* 101* 109*  BUN 105* 105* 104* 97* 87*  CREATININE 7.04* 7.06* 7.04* 6.77* 5.91*  ALBUMIN 3.5  --  2.8* 2.7* 2.7*  CALCIUM 9.0 8.6* 8.5* 8.4* 8.2*  PHOS  --   --   --  7.6* 6.3*  AST 13*  --  9*  --   --   ALT 11  --  9  --   --    Liver Function Tests: Recent Labs  Lab 06/18/2022 1459 06/15/22 0346 06/16/22 0412 06/17/22 0410  AST 13* 9*  --   --   ALT 11 9  --   --   ALKPHOS 63 45  --   --   BILITOT 0.9 0.6  --   --   PROT 7.3 6.2*  --   --   ALBUMIN 3.5 2.8* 2.7* 2.7*   Recent Labs  Lab 07/03/2022 1459  LIPASE 30   No results for input(s): "AMMONIA" in the last 168 hours. CBC: Recent Labs  Lab 06/15/2022 1459 06/15/22 0346 06/16/22 0412 06/17/22 0410  WBC 15.6* 14.2* 13.1* 11.8*  HGB 12.3* 9.9* 9.5* 9.4*  HCT 37.9* 30.8* 29.2* 29.6*  MCV 96.7 96.6 96.4 96.7  PLT 461* 379 383 394   Cardiac Enzymes: No results for input(s): "CKTOTAL", "CKMB", "CKMBINDEX", "TROPONINI" in the last 168 hours. CBG: No results  for input(s): "GLUCAP" in the last 168 hours.  Iron Studies: No results for input(s): "IRON", "TIBC", "TRANSFERRIN", "FERRITIN" in the last 72 hours. Studies/Results: ECHOCARDIOGRAM COMPLETE  Result Date: 06/16/2022    ECHOCARDIOGRAM REPORT   Patient Name:   [name] Date of Exam: 06/16/2022 Medical Rec #:  [id number]     Height:       74.0 in Accession #:    [id number]    Weight:       250.0 lb Date of Birth:  12/10/1935      BSA:          2.390 m Patient Age:    87 years      BP:           144/75 mmHg Patient Gender: M             HR:           94 bpm. [facility] Procedure: 2D Echo, Cardiac Doppler, Color Doppler and  Intracardiac            Opacification Agent Indications:    CHF-Acute Diastolic I50.31  History:        Patient has prior history of Echocardiogram examinations, most                 recent 11/19/2018. CAD, Signs/Symptoms:Shortness of Breath; Risk                 Factors:Hypertension, Dyslipidemia and Former Smoker.  Sonographer:    Aron Baba Referring Phys: [phone] DAVID TAT  Sonographer Comments: Technically difficult study due to poor echo windows. Image acquisition challenging due to COPD. IMPRESSIONS  1. Left ventricular ejection fraction, by estimation, is 55 to 60%. The left ventricle has normal function. The left ventricle has no regional wall motion abnormalities. There is mild left ventricular hypertrophy. Left ventricular diastolic parameters are consistent with Grade I diastolic dysfunction (impaired relaxation).  2. RV not well visualized. Grossly appears normal in size and function. . Right ventricular systolic function was not well visualized. The right ventricular size is not well visualized. There is normal pulmonary artery systolic pressure.  3. Left atrial size was mildly dilated.  4. The mitral valve is normal in structure. No evidence of mitral valve regurgitation. No evidence of mitral stenosis.  5. The aortic valve is tricuspid. There is moderate  calcification of the aortic valve. There is moderate thickening of the aortic valve. Aortic valve regurgitation is not visualized. No aortic stenosis is present.  6. There is mild dilatation of the ascending aorta, measuring 43 mm.  7. The inferior vena cava is normal in size with greater than 50% respiratory variability, suggesting right atrial pressure of 3 mmHg. FINDINGS  Left Ventricle: Left ventricular ejection fraction, by estimation, is 55 to 60%. The left ventricle has normal function. The left ventricle has no regional wall motion abnormalities. Definity contrast agent was given IV to delineate the left ventricular  endocardial borders. The left ventricular internal cavity size was normal in size. There is mild left ventricular hypertrophy. Left ventricular diastolic parameters are consistent with Grade I diastolic dysfunction (impaired relaxation). Normal left ventricular filling pressure. Right Ventricle: RV not well visualized. Grossly appears normal in size and function. The right ventricular size is not well visualized. Right vetricular wall thickness was not well visualized. Right ventricular systolic function was not well visualized.  There is normal pulmonary artery systolic pressure. The tricuspid regurgitant velocity is 1.62 m/s, and with an assumed right atrial pressure of 3 mmHg, the estimated right ventricular systolic pressure is 13.5 mmHg. Left Atrium: Left atrial size was mildly dilated. Right Atrium: Right atrial size was normal in size. Pericardium: There is no evidence of pericardial effusion. Mitral Valve: The mitral valve is normal in structure. There is mild thickening of the mitral valve leaflet(s). There is mild calcification of the mitral valve leaflet(s). Mild mitral annular calcification. No evidence of mitral valve regurgitation. No evidence of mitral valve stenosis. Tricuspid Valve: The tricuspid valve is normal in structure. Tricuspid valve regurgitation is not demonstrated. No  evidence of tricuspid stenosis. Aortic Valve: The aortic valve is tricuspid. There is moderate calcification of the aortic valve. There is moderate thickening of the aortic valve. There is moderate aortic valve annular calcification. Aortic valve regurgitation is not visualized. No aortic stenosis is present. Aortic valve mean gradient measures 9.0 mmHg. Aortic valve peak gradient measures 17.6 mmHg. Aortic valve area, by VTI measures 2.99 cm. Pulmonic Valve: The pulmonic valve was  not well visualized. Pulmonic valve regurgitation is not visualized. No evidence of pulmonic stenosis. Aorta: The aortic root is normal in size and structure. There is mild dilatation of the ascending aorta, measuring 43 mm. Venous: The inferior vena cava is normal in size with greater than 50% respiratory variability, suggesting right atrial pressure of 3 mmHg. IAS/Shunts: No atrial level shunt detected by color flow Doppler.  LEFT VENTRICLE PLAX 2D LVIDd:         4.10 cm   Diastology LVIDs:         3.00 cm   LV e' medial:    4.88 cm/s LV PW:         1.30 cm   LV E/e' medial:  15.5 LV IVS:        1.10 cm   LV e' lateral:   9.67 cm/s LVOT diam:     2.40 cm   LV E/e' lateral: 7.8 LV SV:         102 LV SV Index:   43 LVOT Area:     4.52 cm  LEFT ATRIUM           Index        RIGHT ATRIUM           Index LA diam:      4.00 cm 1.67 cm/m   RA Area:     17.10 cm LA Vol (A2C): 77.0 ml 32.22 ml/m  RA Volume:   32.70 ml  13.68 ml/m LA Vol (A4C): 92.1 ml 38.54 ml/m  AORTIC VALVE                     PULMONIC VALVE AV Area (Vmax):    2.95 cm      PR End Diast Vel: 6.97 msec AV Area (Vmean):   2.70 cm AV Area (VTI):     2.99 cm AV Vmax:           210.00 cm/s AV Vmean:          137.500 cm/s AV VTI:            0.340 m AV Peak Grad:      17.6 mmHg AV Mean Grad:      9.0 mmHg LVOT Vmax:         137.00 cm/s LVOT Vmean:        82.000 cm/s LVOT VTI:          0.225 m LVOT/AV VTI ratio: 0.66  AORTA Ao Root diam: 3.80 cm Ao Asc diam:  4.30 cm MITRAL  VALVE                TRICUSPID VALVE MV Area (PHT): 2.84 cm     TR Peak grad:   10.5 mmHg MV Decel Time: 267 msec     TR Vmax:        162.00 cm/s MV E velocity: 75.70 cm/s MV A velocity: 107.00 cm/s  SHUNTS MV E/A ratio:  0.71         Systemic VTI:  0.22 m                             Systemic Diam: 2.40 cm Dina Rich MD Electronically signed by Dina Rich MD Signature Date/Time: 06/16/2022/12:06:51 PM    Final    IR NEPHROSTOMY PLACEMENT LEFT  Result Date: 06/15/2022 INDICATION: 87 year old male with bilateral hydronephrosis and acute renal failure. He presents for bilateral percutaneous  nephrostomy tube placement. EXAM: IR NEPHROSTOMY PLACEMENT LEFT; IR NEPHROSTOMY PLACEMENT RIGHT COMPARISON:  None Available. MEDICATIONS: Rocephin 2 g IV; The antibiotic was administered in an appropriate time frame prior to skin puncture. ANESTHESIA/SEDATION: Fentanyl 100 mcg IV; Versed 2 mg IV Moderate Sedation Time:  25 minutes The patient's vital signs and level of consciousness were continuously monitored during the procedure by the interventional radiology nurse under my direct supervision. CONTRAST:  20mL OMNIPAQUE IOHEXOL 300 MG/ML SOLN - administered into the collecting system(s) FLUOROSCOPY: Fluoroscopy Time:  minutes  seconds ( mGy). COMPLICATIONS: None immediate. TECHNIQUE: The procedure, risks, benefits, and alternatives were explained to the patient. Questions regarding the procedure were encouraged and answered. The patient understands and consents to the procedure. LEFT The left flank was prepped with chlorhexidine in a sterile fashion, and a sterile drape was applied covering the operative field. A sterile gown and sterile gloves were used for the procedure. Local anesthesia was provided with 1% Lidocaine. The left flank was interrogated with ultrasound and the left kidney identified. The kidney is hydronephrotic. A suitable access site on the skin overlying the lower pole, posterior calix was  identified. After local mg anesthesia was achieved, a small skin nick was made with an 11 blade scalpel. A 21 gauge Accustick needle was then advanced under direct sonographic guidance into the lower pole of the left kidney. A 0.018 inch wire was advanced under fluoroscopic guidance into the left renal collecting system. The Accustick sheath was then advanced over the wire and a 0.018 system exchanged for a 0.035 system. Gentle hand injection of contrast material confirms placement of the sheath within the renal collecting system. There is severe hydronephrosis. The tract from the scan into the renal collecting system was then dilated serially to 10-French. A 10-French Cook all-purpose drain was then placed and positioned under fluoroscopic guidance. The locking loop is well formed within the left renal pelvis. The catheter was secured to the skin with 2-0 Prolene and a sterile bandage was placed. Catheter was left to gravity bag drainage. RIGHT The right flank was prepped with chlorhexidine in a sterile fashion, and a sterile drape was applied covering the operative field. A sterile gown and sterile gloves were used for the procedure. Local anesthesia was provided with 1% Lidocaine. The right flank was interrogated with ultrasound and the left kidney identified. The kidney is hydronephrotic. There are multiple large cysts overlying the posterior calices. A suitable access site on the skin overlying the lower pole, posterior calix was identified. After local anesthesia was achieved, a small skin nick was made with an 11 blade scalpel. A 21 gauge Accustick needle was then advanced under direct sonographic guidance into the cyst. Aspiration was performed. The cyst was decompressed. Next, the Accustick needle was advanced into the calyx within the lower pole of the right kidney. A 0.018 inch wire was advanced under fluoroscopic guidance into the left renal collecting system. The Accustick sheath was then advanced over  the wire and a 0.018 system exchanged for a 0.035 system. Gentle hand injection of contrast material confirms placement of the sheath within the renal collecting system. There is severe hydronephrosis. The tract from the scan into the renal collecting system was then dilated serially to 10-French. A 10-French Cook all-purpose drain was then placed and positioned under fluoroscopic guidance. The locking loop is well formed within the left renal pelvis. The catheter was secured to the skin with 2-0 Prolene and a sterile bandage was placed. Catheter was left to  gravity bag drainage. IMPRESSION: Successful placement of a bilateral 10 French percutaneous nephrostomy tubes. Electronically Signed   By: Malachy Moan M.D.   On: 06/15/2022 17:34   IR NEPHROSTOMY PLACEMENT RIGHT  Result Date: 06/15/2022 INDICATION: 87 year old male with bilateral hydronephrosis and acute renal failure. He presents for bilateral percutaneous nephrostomy tube placement. EXAM: IR NEPHROSTOMY PLACEMENT LEFT; IR NEPHROSTOMY PLACEMENT RIGHT COMPARISON:  None Available. MEDICATIONS: Rocephin 2 g IV; The antibiotic was administered in an appropriate time frame prior to skin puncture. ANESTHESIA/SEDATION: Fentanyl 100 mcg IV; Versed 2 mg IV Moderate Sedation Time:  25 minutes The patient's vital signs and level of consciousness were continuously monitored during the procedure by the interventional radiology nurse under my direct supervision. CONTRAST:  20mL OMNIPAQUE IOHEXOL 300 MG/ML SOLN - administered into the collecting system(s) FLUOROSCOPY: Fluoroscopy Time:  minutes  seconds ( mGy). COMPLICATIONS: None immediate. TECHNIQUE: The procedure, risks, benefits, and alternatives were explained to the patient. Questions regarding the procedure were encouraged and answered. The patient understands and consents to the procedure. LEFT The left flank was prepped with chlorhexidine in a sterile fashion, and a sterile drape was applied covering the  operative field. A sterile gown and sterile gloves were used for the procedure. Local anesthesia was provided with 1% Lidocaine. The left flank was interrogated with ultrasound and the left kidney identified. The kidney is hydronephrotic. A suitable access site on the skin overlying the lower pole, posterior calix was identified. After local mg anesthesia was achieved, a small skin nick was made with an 11 blade scalpel. A 21 gauge Accustick needle was then advanced under direct sonographic guidance into the lower pole of the left kidney. A 0.018 inch wire was advanced under fluoroscopic guidance into the left renal collecting system. The Accustick sheath was then advanced over the wire and a 0.018 system exchanged for a 0.035 system. Gentle hand injection of contrast material confirms placement of the sheath within the renal collecting system. There is severe hydronephrosis. The tract from the scan into the renal collecting system was then dilated serially to 10-French. A 10-French Cook all-purpose drain was then placed and positioned under fluoroscopic guidance. The locking loop is well formed within the left renal pelvis. The catheter was secured to the skin with 2-0 Prolene and a sterile bandage was placed. Catheter was left to gravity bag drainage. RIGHT The right flank was prepped with chlorhexidine in a sterile fashion, and a sterile drape was applied covering the operative field. A sterile gown and sterile gloves were used for the procedure. Local anesthesia was provided with 1% Lidocaine. The right flank was interrogated with ultrasound and the left kidney identified. The kidney is hydronephrotic. There are multiple large cysts overlying the posterior calices. A suitable access site on the skin overlying the lower pole, posterior calix was identified. After local anesthesia was achieved, a small skin nick was made with an 11 blade scalpel. A 21 gauge Accustick needle was then advanced under direct  sonographic guidance into the cyst. Aspiration was performed. The cyst was decompressed. Next, the Accustick needle was advanced into the calyx within the lower pole of the right kidney. A 0.018 inch wire was advanced under fluoroscopic guidance into the left renal collecting system. The Accustick sheath was then advanced over the wire and a 0.018 system exchanged for a 0.035 system. Gentle hand injection of contrast material confirms placement of the sheath within the renal collecting system. There is severe hydronephrosis. The tract from the scan into the  renal collecting system was then dilated serially to 10-French. A 10-French Cook all-purpose drain was then placed and positioned under fluoroscopic guidance. The locking loop is well formed within the left renal pelvis. The catheter was secured to the skin with 2-0 Prolene and a sterile bandage was placed. Catheter was left to gravity bag drainage. IMPRESSION: Successful placement of a bilateral 10 French percutaneous nephrostomy tubes. Electronically Signed   By: Malachy Moan M.D.   On: 06/15/2022 17:34    Chlorhexidine Gluconate Cloth  6 each Topical Daily   Gerhardt's butt cream   Topical BID   heparin  5,000 Units Subcutaneous Q12H   ipratropium-albuterol  3 mL Nebulization TID   metoprolol tartrate  25 mg Oral BID    BMET    Component Value Date/Time   NA 142 06/17/2022 0410   K 3.8 06/17/2022 0410   CL 108 06/17/2022 0410   CO2 21 (L) 06/17/2022 0410   GLUCOSE 109 (H) 06/17/2022 0410   BUN 87 (H) 06/17/2022 0410   CREATININE 5.91 (H) 06/17/2022 0410   CREATININE 0.69 06/09/2012 0946   CALCIUM 8.2 (L) 06/17/2022 0410   GFRNONAA 9 (L) 06/17/2022 0410   GFRNONAA >89 06/09/2012 0946   GFRAA >60 08/15/2019 1101   GFRAA >89 06/09/2012 0946   CBC    Component Value Date/Time   WBC 11.8 (H) 06/17/2022 0410   RBC 3.06 (L) 06/17/2022 0410   HGB 9.4 (L) 06/17/2022 0410   HCT 29.6 (L) 06/17/2022 0410   PLT 394 06/17/2022 0410    MCV 96.7 06/17/2022 0410   MCH 30.7 06/17/2022 0410   MCHC 31.8 06/17/2022 0410   RDW 14.6 06/17/2022 0410   LYMPHSABS 0.5 (L) 11/20/2018 0753   MONOABS 0.5 11/20/2018 0753   EOSABS 0.0 11/20/2018 0753   BASOSABS 0.0 11/20/2018 0753    A AKI postrenal/obstructive as below. Nonoliguric now S/p b/l PCNs 06/15/22 with IR Significant UOP over the last 24 hours with improvement of BUN/Cr to 87/5.91. Hyperkalemia, improved, stable Metabolic acidosis secondary to #1 with mildly increased anion gap; improved Posterior bladder wall thickening/mass with hematuric urine and resultant hydroureteronephrosis; per urology; suspicious for malignancy CAD Hypertension off home blood pressure medications; blood pressure stable Anasarca/lower extremity edema with concern for secondary cellulitis Question colitis or changes related to treatment of bladder cancer in 2011 on ceftriaxone and metronidazole Mild leukocytosis with no documented fevers   P Anticipate further recovery of GFR but degree of recovery and timing is unclear No indications for dialysis We will follow along Urology following for definitive diagnosis Medication Issues; Preferred narcotic agents for pain control are hydromorphone, fentanyl, and methadone. Morphine should not be used.  Baclofen should be avoided Avoid oral sodium phosphate and magnesium citrate based laxatives / bowel preps  Irena Cords, MD [facility]

## 2022-06-17 NOTE — Evaluation (Signed)
Physical Therapy Evaluation Patient Details Name: JEBEDIAH MAGNER MRN: [id number] DOB: Jan 05, 1936 Today's Date: 06/17/2022  History of Present Illness  Felicia Scheidecker  is a 87 y.o. male, with history of CAD s/p stent placement, hypertension came to hospital with symptoms of incomplete bladder emptying, urinary incontinence, nausea, no vomiting, loose stools for past 3 to 4 days.  Patient says that he was referred to nephrologist by his PCP few days ago and saw the nephrologist, who had recommended that he should get renal ultrasound which was scheduled for May 14.  Patient says that for past few days he noticed that he was having incontinence of urine and also having loose stools, having difficulty controlling his bowel movements.  Patient felt very weak for past few days, also developed nausea but no vomiting.  He also noticed worsening left lower extremity swelling and redness.   Clinical Impression  Patient demonstrates slow labored movement for sitting up at bedside with most difficulty moving BLE secondary to increased pain, once seated required a few minutes before attempting sit to stands due to dizziness, had to put on "special gloves" for holding onto RW during sit to stands and limited to a few slow labored side steps before having to sit due to legs giving way/weakness.  Patient tolerated sitting up in chair with his spouse present after therapy - nursing staff aware.  Patient will benefit from continued skilled physical therapy in hospital and recommended venue below to increase strength, balance, endurance for safe ADLs and gait.           Recommendations for follow up therapy are one component of a multi-disciplinary discharge planning process, led by the attending physician.  Recommendations may be updated based on patient status, additional functional criteria and insurance authorization.  Follow Up Recommendations Can patient physically be transported by private vehicle: No      Assistance Recommended at Discharge Set up Supervision/Assistance  Patient can return home with the following  A lot of help with bathing/dressing/bathroom;A lot of help with walking and/or transfers;Help with stairs or ramp for entrance;Assistance with cooking/housework    Equipment Recommendations None recommended by PT  Recommendations for Other Services       Functional Status Assessment Patient has had a recent decline in their functional status and demonstrates the ability to make significant improvements in function in a reasonable and predictable amount of time.     Precautions / Restrictions Precautions Precautions: Fall Restrictions Weight Bearing Restrictions: No      Mobility  Bed Mobility Overal bed mobility: Needs Assistance Bed Mobility: Sit to Supine       Sit to supine: Mod assist, Max assist        Transfers Overall transfer level: Needs assistance Equipment used: Rolling walker (2 wheels) Transfers: Sit to/from Stand, Bed to chair/wheelchair/BSC Sit to Stand: Mod assist   Step pivot transfers: Mod assist, Max assist       General transfer comment: unsteady labored movement    Ambulation/Gait Ambulation/Gait assistance: Mod assist, Max assist Gait Distance (Feet): 3 Feet Assistive device: Rolling walker (2 wheels) Gait Pattern/deviations: Decreased step length - right, Decreased step length - left, Decreased stride length, Shuffle, Knees buckling Gait velocity: slow     General Gait Details: limited to a few slow labored unsteady shuffling side steps before having to sit due to fall risk  Stairs            Wheelchair Mobility    Modified Rankin (Stroke Patients Only)  Balance Overall balance assessment: Needs assistance Sitting-balance support: Feet supported, No upper extremity supported Sitting balance-Leahy Scale: Fair Sitting balance - Comments: fair/good seated at EOB   Standing balance support: During functional  activity, Reliant on assistive device for balance, Bilateral upper extremity supported Standing balance-Leahy Scale: Poor Standing balance comment: using RW                             Pertinent Vitals/Pain Pain Assessment Pain Assessment: Faces Faces Pain Scale: Hurts little more Pain Location: feet and legs Pain Descriptors / Indicators: Grimacing, Guarding, Sore Pain Intervention(s): Limited activity within patient's tolerance, Monitored during session, Repositioned    Home Living Family/patient expects to be discharged to:: Private residence Living Arrangements: Spouse/significant other Available Help at Discharge: Family;Available 24 hours/day Type of Home: House Home Access: Stairs to enter Entrance Stairs-Rails: Right;Left (to wide to reach both) Entrance Stairs-Number of Steps: 7   Home Layout: One level Home Equipment: Agricultural consultant (2 wheels);Rollator (4 wheels);Cane - single point;Wheelchair - manual;Crutches Additional Comments: forearm cruthces    Prior Function Prior Level of Function : Needs assist       Physical Assist : Mobility (physical);ADLs (physical) Mobility (physical): Bed mobility;Transfers;Gait;Stairs   Mobility Comments: very short household distances using RW, uses 1 forearm crutch with assistance for going up/down steps, uses wheelchair for longer distances, sleeps in a lift chair ADLs Comments: assisted by family     Hand Dominance   Dominant Hand: Right    Extremity/Trunk Assessment   Upper Extremity Assessment Upper Extremity Assessment: Generalized weakness    Lower Extremity Assessment Lower Extremity Assessment: Generalized weakness    Cervical / Trunk Assessment Cervical / Trunk Assessment: Kyphotic  Communication   Communication: No difficulties  Cognition Arousal/Alertness: Awake/alert Behavior During Therapy: WFL for tasks assessed/performed Overall Cognitive Status: Within Functional Limits for tasks  assessed                                          General Comments      Exercises     Assessment/Plan    PT Assessment Patient needs continued PT services  PT Problem List Decreased strength;Decreased activity tolerance;Decreased balance;Decreased mobility       PT Treatment Interventions DME instruction;Gait training;Stair training;Functional mobility training;Therapeutic activities;Therapeutic exercise;Patient/family education;Balance training    PT Goals (Current goals can be found in the Care Plan section)  Acute Rehab PT Goals Patient Stated Goal: return home after rehab PT Goal Formulation: With patient/family Time For Goal Achievement: 07/01/22 Potential to Achieve Goals: Good    Frequency Min 3X/week     Co-evaluation               AM-PAC PT "6 Clicks" Mobility  Outcome Measure Help needed turning from your back to your side while in a flat bed without using bedrails?: A Lot Help needed moving from lying on your back to sitting on the side of a flat bed without using bedrails?: A Lot Help needed moving to and from a bed to a chair (including a wheelchair)?: A Lot Help needed standing up from a chair using your arms (e.g., wheelchair or bedside chair)?: A Lot Help needed to walk in hospital room?: A Lot Help needed climbing 3-5 steps with a railing? : Total 6 Click Score: 11    End of Session Equipment Utilized  During Treatment: Oxygen Activity Tolerance: Patient tolerated treatment well;Patient limited by fatigue Patient left: in chair;with call bell/phone within reach;with family/visitor present Nurse Communication: Mobility status PT Visit Diagnosis: Unsteadiness on feet (R26.81);Other abnormalities of gait and mobility (R26.89);Muscle weakness (generalized) (M62.81)    Time: 9147-8295 PT Time Calculation (min) (ACUTE ONLY): 35 min   Charges:   PT Evaluation $PT Eval Moderate Complexity: 1 Mod PT Treatments $Therapeutic  Activity: 23-37 mins        2:56 PM, 06/17/22 Ocie Bob, MPT Physical Therapist with [facility] 336 [phone] office [phone] mobile phone

## 2022-06-17 NOTE — Progress Notes (Signed)
Referring Physician(s): Dr. Ronne [name]   Supervising Physician: [name]  Patient Status:  [name] - Inpatient   Chief Complaint: Bilateral hydronephrosis due to ureteral obstruction in the setting of suspected bladder cancer +/- metastatic disease. S/p bilateral percutaneous nephrostomy tubes 06/15/22 by Dr. Archer [name]   Subjective: Patient in bed resting, his wife is at the bedside. Patient feels even better today than he did yesterday. No complaints at this time.   Allergies: Other and Sulfa antibiotics  Medications: Prior to Admission medications   Medication Sig Start Date End Date Taking? Authorizing Provider  acetaminophen (TYLENOL) 500 MG tablet Take 1,000 mg by mouth every 6 (six) hours as needed.   Yes [provider]  albuterol (VENTOLIN HFA) 108 (90 Base) MCG/ACT inhaler Inhale 2 puffs into the lungs every 6 (six) hours as needed for wheezing or shortness of breath. Patient taking differently: Inhale 1 puff into the lungs every 6 (six) hours as needed for wheezing or shortness of breath. 08/29/21  Yes Cobb, Ruby Cola, NP  amLODipine-benazepril (LOTREL) 10-40 MG capsule Take 1 capsule by mouth daily.   Yes [provider]  aspirin 81 MG chewable tablet Chew 81 mg by mouth every evening.   Yes [provider]  atorvastatin (LIPITOR) 20 MG tablet Take 1 tablet (20 mg total) by mouth every evening. 11/22/18  Yes [name] Lloyd, MD  Cholecalciferol (VITAMIN D3) 50 MCG (2000 UT) TABS Take 2,000 Units by mouth daily.   Yes [provider]  Cinnamon 500 MG TABS Take 500 mg by mouth 2 (two) times daily.   Yes [provider]  Cyanocobalamin (VITAMIN B12 PO) Use as directed 1 tablet in the mouth or throat daily.   Yes [provider]  Emollient (CERAVE) CREA Apply 1 application topically 2 (two) times daily.   Yes [provider]  ferrous sulfate 325 (65 FE) MG tablet Take 325 mg by mouth daily with breakfast.   Yes  [provider]  Fluticasone-Umeclidin-Vilant (TRELEGY ELLIPTA) 200-62.5-25 MCG/ACT AEPB Inhale 1 puff into the lungs daily. 08/29/21  Yes Cobb, Ruby Cola, NP  hydrochlorothiazide (HYDRODIURIL) 25 MG tablet Take 25 mg by mouth daily.   Yes [provider]  loperamide (IMODIUM A-D) 2 MG tablet Take 2 mg by mouth 4 (four) times daily as needed for diarrhea or loose stools.   Yes [provider]  Magnesium 250 MG TABS Take 250 mg by mouth daily.    Yes [provider]  metoprolol tartrate (LOPRESSOR) 100 MG tablet Take 1 tablet (100 mg total) by mouth at bedtime. 11/22/18  Yes [name] Lloyd, MD  Multiple Vitamins-Minerals (MENS MULTIPLE VITAMIN/LYCOPENE PO) Take 1 tablet by mouth daily.   Yes [provider]  nitroGLYCERIN (NITROSTAT) 0.4 MG SL tablet Place 1 tablet (0.4 mg total) under the tongue every 5 (five) minutes as needed. 12/27/12  Yes [name] Rotunda, MD  OVER THE COUNTER MEDICATION Take 1 tablet by mouth 2 (two) times daily. Fish, flax and borage oil   Yes [provider]  Turmeric (QC TUMERIC COMPLEX PO) Take by mouth 2 (two) times daily.   Yes [provider]  zinc gluconate 50 MG tablet Take 50 mg by mouth daily.   Yes [provider]     Vital Signs: BP 124/68 (BP Location: Left Arm)   Pulse 71   Temp 98.8 F (37.1 C) (Oral)   Resp 17   Ht 6\' 2"  (1.88 m)   Wt 250 lb (113.4  kg)   SpO2 94%   BMI 32.10 kg/m   Physical Exam Constitutional:      General: He is not in acute distress.    Appearance: He is not ill-appearing.  Pulmonary:     Effort: Pulmonary effort is normal.  Genitourinary:    Comments: Bilateral PCNs. Left PCN with approximately 50 ml of bloody/serosanguineous output. Right PCN with approximately 500 ml of blood-tinged urine.  Skin:    General: Skin is warm and dry.  Neurological:     Mental Status: He is alert and oriented to person, place, and time.  Psychiatric:        Mood and  Affect: Mood normal.        Behavior: Behavior normal.        Thought Content: Thought content normal.        Judgment: Judgment normal.     Imaging: ECHOCARDIOGRAM COMPLETE  Result Date: 06/16/2022    ECHOCARDIOGRAM REPORT   Patient Name:   [name] Date of Exam: 06/16/2022 Medical Rec #:  [id number]     Height:       74.0 in Accession #:    [id number]    Weight:       250.0 lb Date of Birth:  December 26, 1935      BSA:          2.390 m Patient Age:    87 years      BP:           144/75 mmHg Patient Gender: M             HR:           94 bpm. Exam Location:  [name] Procedure: 2D Echo, Cardiac Doppler, Color Doppler and Intracardiac            Opacification Agent Indications:    CHF-Acute Diastolic I50.31  History:        Patient has prior history of Echocardiogram examinations, most                 recent 11/19/2018. CAD, Signs/Symptoms:Shortness of Breath; Risk                 Factors:Hypertension, Dyslipidemia and Former Smoker.  Sonographer:    [name] Referring Phys: [phone] [name]  Sonographer Comments: Technically difficult study due to poor echo windows. Image acquisition challenging due to COPD. IMPRESSIONS  1. Left ventricular ejection fraction, by estimation, is 55 to 60%. The left ventricle has normal function. The left ventricle has no regional wall motion abnormalities. There is mild left ventricular hypertrophy. Left ventricular diastolic parameters are consistent with Grade I diastolic dysfunction (impaired relaxation).  2. RV not well visualized. Grossly appears normal in size and function. . Right ventricular systolic function was not well visualized. The right ventricular size is not well visualized. There is normal pulmonary artery systolic pressure.  3. Left atrial size was mildly dilated.  4. The mitral valve is normal in structure. No evidence of mitral valve regurgitation. No evidence of mitral stenosis.  5. The aortic valve is tricuspid. There is moderate calcification of the  aortic valve. There is moderate thickening of the aortic valve. Aortic valve regurgitation is not visualized. No aortic stenosis is present.  6. There is mild dilatation of the ascending aorta, measuring 43 mm.  7. The inferior vena cava is normal in size with greater than 50% respiratory variability, suggesting right atrial pressure of 3 mmHg. FINDINGS  Left Ventricle: Left  ventricular ejection fraction, by estimation, is 55 to 60%. The left ventricle has normal function. The left ventricle has no regional wall motion abnormalities. Definity contrast agent was given IV to delineate the left ventricular  endocardial borders. The left ventricular internal cavity size was normal in size. There is mild left ventricular hypertrophy. Left ventricular diastolic parameters are consistent with Grade I diastolic dysfunction (impaired relaxation). Normal left ventricular filling pressure. Right Ventricle: RV not well visualized. Grossly appears normal in size and function. The right ventricular size is not well visualized. Right vetricular wall thickness was not well visualized. Right ventricular systolic function was not well visualized.  There is normal pulmonary artery systolic pressure. The tricuspid regurgitant velocity is 1.62 m/s, and with [name] assumed right atrial pressure of 3 mmHg, the estimated right ventricular systolic pressure is 13.5 mmHg. Left Atrium: Left atrial size was mildly dilated. Right Atrium: Right atrial size was normal in size. Pericardium: There is no evidence of pericardial effusion. Mitral Valve: The mitral valve is normal in structure. There is mild thickening of the mitral valve leaflet(s). There is mild calcification of the mitral valve leaflet(s). Mild mitral annular calcification. No evidence of mitral valve regurgitation. No evidence of mitral valve stenosis. Tricuspid Valve: The tricuspid valve is normal in structure. Tricuspid valve regurgitation is not demonstrated. No evidence of  tricuspid stenosis. Aortic Valve: The aortic valve is tricuspid. There is moderate calcification of the aortic valve. There is moderate thickening of the aortic valve. There is moderate aortic valve annular calcification. Aortic valve regurgitation is not visualized. No aortic stenosis is present. Aortic valve mean gradient measures 9.0 mmHg. Aortic valve peak gradient measures 17.6 mmHg. Aortic valve area, by VTI measures 2.99 cm. Pulmonic Valve: The pulmonic valve was not well visualized. Pulmonic valve regurgitation is not visualized. No evidence of pulmonic stenosis. Aorta: The aortic root is normal in size and structure. There is mild dilatation of the ascending aorta, measuring 43 mm. Venous: The inferior vena cava is normal in size with greater than 50% respiratory variability, suggesting right atrial pressure of 3 mmHg. IAS/Shunts: No atrial level shunt detected by color flow Doppler.  LEFT VENTRICLE PLAX 2D LVIDd:         4.10 cm   Diastology LVIDs:         3.00 cm   LV e' medial:    4.88 cm/s LV PW:         1.30 cm   LV E/e' medial:  15.5 LV IVS:        1.10 cm   LV e' lateral:   9.67 cm/s LVOT diam:     2.40 cm   LV E/e' lateral: 7.8 LV SV:         102 LV SV Index:   43 LVOT Area:     4.52 cm  LEFT ATRIUM           Index        RIGHT ATRIUM           Index LA diam:      4.00 cm 1.67 cm/m   RA Area:     17.10 cm LA Vol (A2C): 77.0 ml 32.22 ml/m  RA Volume:   32.70 ml  13.68 ml/m LA Vol (A4C): 92.1 ml 38.54 ml/m  AORTIC VALVE                     PULMONIC VALVE AV Area (Vmax):    2.95 cm  PR End Diast Vel: 6.97 msec AV Area (Vmean):   2.70 cm AV Area (VTI):     2.99 cm AV Vmax:           210.00 cm/s AV Vmean:          137.500 cm/s AV VTI:            0.340 m AV Peak Grad:      17.6 mmHg AV Mean Grad:      9.0 mmHg LVOT Vmax:         137.00 cm/s LVOT Vmean:        82.000 cm/s LVOT VTI:          0.225 m LVOT/AV VTI ratio: 0.66  AORTA Ao Root diam: 3.80 cm Ao Asc diam:  4.30 cm MITRAL VALVE                 TRICUSPID VALVE MV Area (PHT): 2.84 cm     TR Peak grad:   10.5 mmHg MV Decel Time: 267 msec     TR Vmax:        162.00 cm/s MV E velocity: 75.70 cm/s MV A velocity: 107.00 cm/s  SHUNTS MV E/A ratio:  0.71         Systemic VTI:  0.22 m                             Systemic Diam: 2.40 cm Dina Rich MD Electronically signed by Dina Rich MD Signature Date/Time: 06/16/2022/12:06:51 PM    Final    IR NEPHROSTOMY PLACEMENT LEFT  Result Date: 06/15/2022 INDICATION: 87 year old male with bilateral hydronephrosis and acute renal failure. He presents for bilateral percutaneous nephrostomy tube placement. EXAM: IR NEPHROSTOMY PLACEMENT LEFT; IR NEPHROSTOMY PLACEMENT RIGHT COMPARISON:  None Available. MEDICATIONS: Rocephin 2 g IV; The antibiotic was administered in [name] appropriate time frame prior to skin puncture. ANESTHESIA/SEDATION: Fentanyl 100 mcg IV; Versed 2 mg IV Moderate Sedation Time:  25 minutes The patient's vital signs and level of consciousness were continuously monitored during the procedure by the interventional radiology nurse under my direct supervision. CONTRAST:  20mL OMNIPAQUE IOHEXOL 300 MG/ML SOLN - administered into the collecting system(s) FLUOROSCOPY: Fluoroscopy Time:  minutes  seconds ( mGy). COMPLICATIONS: None immediate. TECHNIQUE: The procedure, risks, benefits, and alternatives were explained to the patient. Questions regarding the procedure were encouraged and answered. The patient understands and consents to the procedure. LEFT The left flank was prepped with chlorhexidine in a sterile fashion, and a sterile drape was applied covering the operative field. A sterile gown and sterile gloves were used for the procedure. Local anesthesia was provided with 1% Lidocaine. The left flank was interrogated with ultrasound and the left kidney identified. The kidney is hydronephrotic. A suitable access site on the skin overlying the lower pole, posterior calix was identified. After local  mg anesthesia was achieved, a small skin nick was made with [name] 11 blade scalpel. A 21 gauge Accustick needle was then advanced under direct sonographic guidance into the lower pole of the left kidney. A 0.018 inch wire was advanced under fluoroscopic guidance into the left renal collecting system. The Accustick sheath was then advanced over the wire and a 0.018 system exchanged for a 0.035 system. Gentle hand injection of contrast material confirms placement of the sheath within the renal collecting system. There is severe hydronephrosis. The tract from the scan into the renal collecting system was then dilated serially  to 10-French. A 10-French Cook all-purpose drain was then placed and positioned under fluoroscopic guidance. The locking loop is well formed within the left renal pelvis. The catheter was secured to the skin with 2-0 Prolene and a sterile bandage was placed. Catheter was left to gravity bag drainage. RIGHT The right flank was prepped with chlorhexidine in a sterile fashion, and a sterile drape was applied covering the operative field. A sterile gown and sterile gloves were used for the procedure. Local anesthesia was provided with 1% Lidocaine. The right flank was interrogated with ultrasound and the left kidney identified. The kidney is hydronephrotic. There are multiple large cysts overlying the posterior calices. A suitable access site on the skin overlying the lower pole, posterior calix was identified. After local anesthesia was achieved, a small skin nick was made with [name] 11 blade scalpel. A 21 gauge Accustick needle was then advanced under direct sonographic guidance into the cyst. Aspiration was performed. The cyst was decompressed. Next, the Accustick needle was advanced into the calyx within the lower pole of the right kidney. A 0.018 inch wire was advanced under fluoroscopic guidance into the left renal collecting system. The Accustick sheath was then advanced over the wire and a 0.018  system exchanged for a 0.035 system. Gentle hand injection of contrast material confirms placement of the sheath within the renal collecting system. There is severe hydronephrosis. The tract from the scan into the renal collecting system was then dilated serially to 10-French. A 10-French Cook all-purpose drain was then placed and positioned under fluoroscopic guidance. The locking loop is well formed within the left renal pelvis. The catheter was secured to the skin with 2-0 Prolene and a sterile bandage was placed. Catheter was left to gravity bag drainage. IMPRESSION: Successful placement of a bilateral 10 French percutaneous nephrostomy tubes. Electronically Signed   By: Malachy Moan M.D.   On: 06/15/2022 17:34   IR NEPHROSTOMY PLACEMENT RIGHT  Result Date: 06/15/2022 INDICATION: 87 year old male with bilateral hydronephrosis and acute renal failure. He presents for bilateral percutaneous nephrostomy tube placement. EXAM: IR NEPHROSTOMY PLACEMENT LEFT; IR NEPHROSTOMY PLACEMENT RIGHT COMPARISON:  None Available. MEDICATIONS: Rocephin 2 g IV; The antibiotic was administered in [name] appropriate time frame prior to skin puncture. ANESTHESIA/SEDATION: Fentanyl 100 mcg IV; Versed 2 mg IV Moderate Sedation Time:  25 minutes The patient's vital signs and level of consciousness were continuously monitored during the procedure by the interventional radiology nurse under my direct supervision. CONTRAST:  20mL OMNIPAQUE IOHEXOL 300 MG/ML SOLN - administered into the collecting system(s) FLUOROSCOPY: Fluoroscopy Time:  minutes  seconds ( mGy). COMPLICATIONS: None immediate. TECHNIQUE: The procedure, risks, benefits, and alternatives were explained to the patient. Questions regarding the procedure were encouraged and answered. The patient understands and consents to the procedure. LEFT The left flank was prepped with chlorhexidine in a sterile fashion, and a sterile drape was applied covering the operative field. A  sterile gown and sterile gloves were used for the procedure. Local anesthesia was provided with 1% Lidocaine. The left flank was interrogated with ultrasound and the left kidney identified. The kidney is hydronephrotic. A suitable access site on the skin overlying the lower pole, posterior calix was identified. After local mg anesthesia was achieved, a small skin nick was made with [name] 11 blade scalpel. A 21 gauge Accustick needle was then advanced under direct sonographic guidance into the lower pole of the left kidney. A 0.018 inch wire was advanced under fluoroscopic guidance into the left renal collecting  system. The Accustick sheath was then advanced over the wire and a 0.018 system exchanged for a 0.035 system. Gentle hand injection of contrast material confirms placement of the sheath within the renal collecting system. There is severe hydronephrosis. The tract from the scan into the renal collecting system was then dilated serially to 10-French. A 10-French Cook all-purpose drain was then placed and positioned under fluoroscopic guidance. The locking loop is well formed within the left renal pelvis. The catheter was secured to the skin with 2-0 Prolene and a sterile bandage was placed. Catheter was left to gravity bag drainage. RIGHT The right flank was prepped with chlorhexidine in a sterile fashion, and a sterile drape was applied covering the operative field. A sterile gown and sterile gloves were used for the procedure. Local anesthesia was provided with 1% Lidocaine. The right flank was interrogated with ultrasound and the left kidney identified. The kidney is hydronephrotic. There are multiple large cysts overlying the posterior calices. A suitable access site on the skin overlying the lower pole, posterior calix was identified. After local anesthesia was achieved, a small skin nick was made with [name] 11 blade scalpel. A 21 gauge Accustick needle was then advanced under direct sonographic guidance into  the cyst. Aspiration was performed. The cyst was decompressed. Next, the Accustick needle was advanced into the calyx within the lower pole of the right kidney. A 0.018 inch wire was advanced under fluoroscopic guidance into the left renal collecting system. The Accustick sheath was then advanced over the wire and a 0.018 system exchanged for a 0.035 system. Gentle hand injection of contrast material confirms placement of the sheath within the renal collecting system. There is severe hydronephrosis. The tract from the scan into the renal collecting system was then dilated serially to 10-French. A 10-French Cook all-purpose drain was then placed and positioned under fluoroscopic guidance. The locking loop is well formed within the left renal pelvis. The catheter was secured to the skin with 2-0 Prolene and a sterile bandage was placed. Catheter was left to gravity bag drainage. IMPRESSION: Successful placement of a bilateral 10 French percutaneous nephrostomy tubes. Electronically Signed   By: Malachy Moan M.D.   On: 06/15/2022 17:34   US RENAL  Result Date: 06/15/2022 CLINICAL DATA:  [id number] AKI (acute kidney injury) (HCC) [id number] EXAM: RENAL / URINARY TRACT ULTRASOUND COMPLETE COMPARISON:  CT 06/28/2022 FINDINGS: Right Kidney: Renal measurements: 13.1 x 7.0 x 7.6 cm = volume: 363.7 mL. Persistent moderate hydronephrosis. Left Kidney: Renal measurements: 11.9 x 7.4 x 5.6 cm = volume: 260.1 mL. Persistent moderate hydronephrosis. Bladder: Foley catheter is in place with heterogeneous hyperechoic material adjacent to the Foley balloon, which is avascular. Posterior bladder wall thickening. Other: None. IMPRESSION: Persistent moderate hydronephrosis, similar to recent CT. Intraluminal echogenic material in the bladder which could represent blood clot, correlate with urinalysis. Posterior bladder wall thickening as seen on recent CT. Electronically Signed   By: Caprice Renshaw M.D.   On: 06/15/2022 12:38   US Venous  Img Lower Bilateral (DVT)  Result Date: 06/15/2022 CLINICAL DATA:  Bilateral lower extremity pain and edema, left-greater-than-right. Former smoker. Evaluate for DVT. EXAM: BILATERAL LOWER EXTREMITY VENOUS DOPPLER ULTRASOUND TECHNIQUE: Gray-scale sonography with graded compression, as well as color Doppler and duplex ultrasound were performed to evaluate the lower extremity deep venous systems from the level of the common femoral vein and including the common femoral, femoral, profunda femoral, popliteal and calf veins including the posterior tibial, peroneal and gastrocnemius veins when  visible. The superficial great saphenous vein was also interrogated. Spectral Doppler was utilized to evaluate flow at rest and with distal augmentation maneuvers in the common femoral, femoral and popliteal veins. COMPARISON:  CT abdomen and pelvis-07/09/2022 FINDINGS: Examination is degraded due to patient body habitus and poor sonographic window RIGHT LOWER EXTREMITY Common Femoral Vein: No evidence of thrombus. Normal compressibility, respiratory phasicity and response to augmentation. Saphenofemoral Junction: No evidence of thrombus. Normal compressibility and flow on color Doppler imaging. Profunda Femoral Vein: No evidence of thrombus. Normal compressibility and flow on color Doppler imaging. Femoral Vein: No evidence of thrombus. Normal compressibility, respiratory phasicity and response to augmentation. Popliteal Vein: No evidence of thrombus. Normal compressibility, respiratory phasicity and response to augmentation. Calf Veins: No evidence of thrombus. Normal compressibility and flow on color Doppler imaging. Superficial Great Saphenous Vein: No evidence of thrombus. Normal compressibility. Other Findings:  None. LEFT LOWER EXTREMITY Common Femoral Vein: No evidence of thrombus. Normal compressibility, respiratory phasicity and response to augmentation. Saphenofemoral Junction: No evidence of thrombus. Normal  compressibility and flow on color Doppler imaging. Profunda Femoral Vein: No evidence of thrombus. Normal compressibility and flow on color Doppler imaging. Femoral Vein: No evidence of thrombus. Normal compressibility, respiratory phasicity and response to augmentation. Popliteal Vein: No evidence of thrombus. Normal compressibility, respiratory phasicity and response to augmentation. Calf Veins: No evidence of thrombus. Normal compressibility and flow on color Doppler imaging. Superficial Great Saphenous Vein: No evidence of thrombus. Normal compressibility. Other Findings: Pathologically enlarged left inguinal lymph nodes as demonstrated on preceding abdominal CT with index inguinal lymph node measuring 1.3 cm in greatest short axis diameter (image 49). There is a moderate amount of subcutaneous edema at the level of the left lower leg and calf. IMPRESSION: 1. No evidence of DVT within the left lower extremity. 2. Pathologically enlarged left inguinal lymph nodes, similar to preceding abdominal CT performed 06/27/2022. Electronically Signed   By: Simonne Come M.D.   On: 06/15/2022 10:59   DG Chest Port 1V same Day  Result Date: 06/15/2022 CLINICAL DATA:  Dyspnea EXAM: PORTABLE CHEST 1 VIEW COMPARISON:  08/15/2019, CT 06/05/2022 FINDINGS: Stable coarsening of the pulmonary interstitium. The lungs are symmetrically well inflated. Known spiculated pulmonary nodule within the peripheral right upper lobe seen on prior CT examination is not well appreciated on this exam. New retrocardiac opacity in keeping with a focal pulmonary infiltrate within this region, possibly infectious in the acute setting. No pneumothorax or pleural effusion. Cardiac size within normal limits. Central pulmonary vascular congestion without overt pulmonary edema. IMPRESSION: 1. New retrocardiac pulmonary infiltrate, possibly infectious in the acute setting. 2. Central pulmonary vascular congestion without overt pulmonary edema. 3. Known  right upper lobe spiculated pulmonary nodule not well appreciated on this exam. Electronically Signed   By: Helyn Numbers M.D.   On: 06/15/2022 00:48   CT ABDOMEN PELVIS WO CONTRAST  Result Date:  CLINICAL DATA:  Abdominal pain, nausea and diarrhea, history of colon cancer EXAM: CT ABDOMEN AND PELVIS WITHOUT CONTRAST TECHNIQUE: Multidetector CT imaging of the abdomen and pelvis was performed following the standard protocol without IV contrast. Unenhanced CT was performed per clinician order. Lack of IV contrast limits sensitivity and specificity, especially for evaluation of abdominal/pelvic solid viscera. RADIATION DOSE REDUCTION: This exam was performed according to the departmental dose-optimization program which includes automated exposure control, adjustment of the mA and/or kV according to patient size and/or use of iterative reconstruction technique. COMPARISON:  05/16/2022 FINDINGS: Lower chest: New consolidation within  the posterior left lower lobe at the costophrenic angle, which could be atelectasis or acute airspace disease. No pleural effusion. Hepatobiliary: Unremarkable unenhanced appearance of the liver. The gallbladder is moderately distended, without evidence of cholelithiasis or cholecystitis. Pancreas: Unremarkable unenhanced appearance. Spleen: Unremarkable unenhanced appearance. Adrenals/Urinary Tract: Bilateral hydronephrosis is again identified, as seen on recent chest CT. There is also bilateral hydroureter to the level of the base of the bladder. There is marked posterior bladder wall thickening, measuring up to 14 mm. High attenuation material seen within the bladder surrounding the Foley catheter balloon consistent with blood products. Differential diagnosis include cystitis, chronic scarring from previous radiation therapy in a patient with a history of colon cancer, or bladder neoplasm. Urology consultation recommended. There are bilateral nonobstructing renal calculi,  measuring 5 mm on the left and 3 mm on the right. Simple appearing right renal cysts do not require specific imaging follow-up. The adrenals are unremarkable. Stomach/Bowel: Postsurgical changes are seen from distal colectomy and reanastomosis. There is segmental wall thickening of the descending colon extending to the level of the anastomosis, compatible with inflammatory or infectious colitis. Moderate distension of the colon with retained gas and stool. No evidence of small-bowel obstruction. Vascular/Lymphatic: Lymphadenopathy has developed within the bilateral inguinal regions and retroperitoneum. Index lymph node in the left inguinal region reference image 76/2 measures 15 mm in short axis. Left para-aortic adenopathy measures up to 14 mm in short axis reference image 43/2. There is extensive atherosclerosis throughout the aorta and its distal branches. Evaluation of the vascular lumen is limited without IV contrast. Reproductive: The prostate is enlarged, measuring 5.7 x 4.8 cm in cross-sectional diameter. Other: There is diffuse fat stranding in the perirectal region, surrounding the prostate, and adjacent to the wall thickening at the base of the bladder. This is nonspecific, and could be related to prior radiation therapy. There is no free intraperitoneal fluid or free intraperitoneal gas. There are fat containing midline ventral hernias. The periumbilical fat containing ventral hernia on image 53 demonstrates stranding within the herniated fat, could reflect incarceration. No bowel herniation. There is also evidence of prior ventral hernia repair. Musculoskeletal: No acute or destructive bony lesions. Stable severe right hip osteoarthritis. Reconstructed images demonstrate no additional findings. IMPRESSION: 1. Marked wall thickening of the posterior aspect of the bladder, measuring up to 14 mm. This results in severe bilateral hydroureteronephrosis. Differential diagnosis includes neoplasm, post  radiation change, or cystitis. High attenuation material surrounding the Foley catheter within the bladder lumen consistent with blood products. Urology consultation and cystoscopy recommended. 2. Segmental wall thickening of the distal colon, extending from the splenic flexure through the rectosigmoid anastomosis, suspicious for underlying inflammatory or infectious colitis. 3. New retroperitoneal and bilateral inguinal lymphadenopathy. Metastatic disease not excluded. 4. New dependent consolidation within the left lower lobe posterior costophrenic angle, consistent with pneumonia or atelectasis. 5. Fat containing midline ventral hernias. The inferior ventral hernia demonstrates stranding within the herniated fat, incarcerated hernia not excluded. No bowel herniation. 6. Enlarged prostate. 7. Nonspecific fat stranding within the lower pelvis surrounding the rectosigmoid anastomosis, prostate, and bladder base. This could be sequela of prior radiation therapy. 8.  Aortic Atherosclerosis (ICD10-I70.0). 9. Bilateral nonobstructing renal calculi. Electronically Signed   By: Sharlet Salina M.D.   On:  17:06    Labs:  CBC: Recent Labs    07/07/2022 1459 06/15/22 0346 06/16/22 0412 06/17/22 0410  WBC 15.6* 14.2* 13.1* 11.8*  HGB 12.3* 9.9* 9.5* 9.4*  HCT 37.9* 30.8* 29.2* 29.6*  PLT 461* 379 383 394    COAGS: Recent Labs    06/15/22 1352  INR 1.2    BMP: Recent Labs    06/13/2022 1846 06/15/22 0346 06/16/22 0412 06/17/22 0410  NA 138 139 142 142  K 5.7* 4.8 4.4 3.8  CL 108 108 108 108  CO2 13* 16* 20* 21*  GLUCOSE 106* 134* 101* 109*  BUN 105* 104* 97* 87*  CALCIUM 8.6* 8.5* 8.4* 8.2*  CREATININE 7.06* 7.04* 6.77* 5.91*  GFRNONAA 7* 7* 7* 9*    LIVER FUNCTION TESTS: Recent Labs    07/10/2022 1459 06/15/22 0346 06/16/22 0412 06/17/22 0410  BILITOT 0.9 0.6  --   --   AST 13* 9*  --   --   ALT 11 9  --   --   ALKPHOS 63 45  --   --   PROT 7.3 6.2*  --   --   ALBUMIN  3.5 2.8* 2.7* 2.7*    Assessment and Plan:  Bilateral hydronephrosis due to ureteral obstruction in the setting of suspected bladder cancer +/- metastatic disease. S/p bilateral percutaneous nephrostomy tubes 06/15/22 by Dr. Archer [name]   WBC and creatinine continue to downtrend. Urine in gravity bags are clearer today than yesterday but still bloody/blood-tinged left > right.   24 hour output as documented on I/O flowsheet Left PCN: 1.1 L Right PCN 3.3 L   Please continue to flush each PCN every shift with 5-10 ml of normal saline. Change the dressings daily or as needed.    IR will continue to follow.   Electronically Signed: Alwyn Ren, AGACNP-BC [phone] 06/17/2022, 9:23 AM   I spent a total of 15 Minutes at the the patient's bedside AND on the patient's hospital floor or unit, greater than 50% of which was counseling/coordinating care for bilateral PCNs

## 2022-06-17 NOTE — [place] FL2 (Signed)
[place] MEDICAID FL2 LEVEL OF CARE FORM     IDENTIFICATION  Patient Name: [name] Birthdate: [birth year] Sex: male Admission Date (Current Location): 06/20/2022  [facility] and [place] Number:  [employer] and Address:  [facility],  [address], [place] [address]      Provider Number: [id number]  Attending Physician Name and Address:  Cleora Fleet, MD  Relative Name and Phone Number:  [place], Drey (Spouse) [phone]    Current Level of Care: Hospital Recommended Level of Care: Skilled Nursing Facility Prior Approval Number:    Date Approved/Denied:   PASRR Number: [id number]  Discharge Plan: SNF    Current Diagnoses: Patient Active Problem List   Diagnosis Date Noted   Acute diarrhea 06/16/2022   C. 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/23/2019   DVT (deep venous thrombosis) (HCC) 03/23/2019   Venous stasis 01/02/2019   Weakness of both lower extremities    Abnormal MRI    Bacteria in urine    Transaminitis    Bilateral leg weakness 11/13/2018   Venous stasis ulcer (HCC) 11/13/2018   Petechial rash    Purpura of skin due to vascular fragility (HCC)    Wounds, multiple    Severe protein-calorie malnutrition (HCC)    Abnormal EKG 04/20/2018   Abdominal aortic aneurysm (AAA) without rupture (HCC) 04/20/2018   Dyslipidemia 03/24/2017   HTN (hypertension) 06/02/2010    Arthritis 06/02/2010   CAD (coronary artery disease) 06/02/2010   Colon cancer (HCC) 06/02/2010   Pseudo-gout 06/02/2010   DYSLIPIDEMIA 12/12/2008   HYPERTENSION, UNSPECIFIED 04/30/2008   CAD, UNSPECIFIED SITE 04/30/2008    Orientation RESPIRATION BLADDER Height & Weight     Self, Time, Situation, Place  Normal Indwelling catheter Weight: 113.4 kg Height:  6\' 2"  (188 cm)  BEHAVIORAL SYMPTOMS/MOOD NEUROLOGICAL BOWEL NUTRITION STATUS      Continent Diet (See DC summary)  AMBULATORY STATUS COMMUNICATION OF NEEDS Skin   Extensive Assist Verbally Other (Comment) (redness)                       Personal Care Assistance Level of Assistance  Bathing, Feeding, Dressing Bathing Assistance: Maximum assistance Feeding assistance: Independent Dressing Assistance: Limited assistance     Functional Limitations Info  Sight, Hearing, Speech Sight Info: Impaired Hearing Info: Adequate Speech Info: Adequate    SPECIAL CARE FACTORS FREQUENCY  PT (By licensed PT)     PT Frequency: 5 Times a week              Contractures Contractures Info: Not present    Additional Factors Info  Code Status, Allergies Code Status Info: FULL Allergies Info: Sulfa           Current Medications (06/17/2022):  This is the current hospital active medication list Current Facility-Administered Medications  Medication Dose Route Frequency Provider Last Rate Last Admin   acetaminophen (TYLENOL) tablet 650 mg  650 mg Oral  Q6H PRN Meredeth Ide, MD       Or   acetaminophen (TYLENOL) suppository 650 mg  650 mg Rectal Q6H PRN Meredeth Ide, MD       cefTRIAXone (ROCEPHIN) 1 g in sodium chloride 0.9 % 100 mL IVPB  1 g Intravenous Q24H Johnson, Clanford L, MD 200 mL/hr at 06/16/22 1812 1 g at 06/16/22 1812   Chlorhexidine Gluconate Cloth 2 % PADS 6 each  6 each Topical Daily Tat, Onalee Hua, MD   6 each at 06/17/22 1045   Gerhardt's butt cream   Topical BID Tat, Onalee Hua, MD   Given at 06/16/22 0852   heparin  injection 5,000 Units  5,000 Units Subcutaneous Q12H Meredeth Ide, MD   5,000 Units at 06/17/22 0851   ipratropium-albuterol (DUONEB) 0.5-2.5 (3) MG/3ML nebulizer solution 3 mL  3 mL Nebulization TID Catarina Hartshorn, MD   3 mL at 06/17/22 0718   metoprolol tartrate (LOPRESSOR) tablet 25 mg  25 mg Oral BID Meredeth Ide, MD   25 mg at 06/17/22 4098   metroNIDAZOLE (FLAGYL) IVPB 500 mg  500 mg Intravenous Q12H Johnson, Clanford L, MD 100 mL/hr at 06/17/22 0851 500 mg at 06/17/22 0851   ondansetron (ZOFRAN) tablet 4 mg  4 mg Oral Q6H PRN Meredeth Ide, MD       Or   ondansetron (ZOFRAN) injection 4 mg  4 mg Intravenous Q6H PRN Meredeth Ide, MD         Discharge Medications: Please see discharge summary for a list of discharge medications.  Relevant Imaging Results:  Relevant Lab Results:   Additional Information SS# [id number]  Leitha Bleak, RN

## 2022-06-17 NOTE — TOC Initial Note (Signed)
Transition of Care [facility]) - Initial/Assessment Note    Patient Details  Name: [name] MRN: [id number] Date of Birth: [birth year]  [facility]) CM/SW Contact:    Leitha Bleak, RN Phone Number: 06/17/2022, 1:13 PM  Clinical Narrative:      Patient admitted with acute kidney injury. PT is recommending SNF.  CM spoke with his wife, she is at the bedside. They both agreed he should go to SNF to get stronger. FL2 sent out for bed offers and will call to discuss with them. DC planning for 2-3 days.              Expected Discharge Plan: Skilled Nursing Facility Barriers to Discharge: Continued Medical Work up   Patient Goals and CMS Choice Patient states their goals for this hospitalization and ongoing recovery are:: agreeable to SNF CMS Medicare.gov Compare Post Acute Care list provided to:: Patient Represenative (must comment) Choice offered to / list presented to : Spouse [place] ownership interest in [facility].provided to:: Spouse    Expected Discharge Plan and Services     Post Acute Care Choice: Skilled Nursing Facility Living arrangements for the past 2 months: Single Family Home                   Prior Living Arrangements/Services Living arrangements for the past 2 months: Single Family Home Lives with:: Spouse Patient language and need for interpreter reviewed:: Yes        Need for Family Participation in Patient Care: Yes (Comment) Care giver support system in place?: Yes (comment)   Criminal Activity/Legal Involvement Pertinent to Current Situation/Hospitalization: No - Comment as needed  Activities of Daily Living Home Assistive Devices/Equipment: Dan Humphreys (specify type) ADL Screening (condition at time of admission) Patient's cognitive ability adequate to safely complete daily activities?: Yes Is the patient deaf or have difficulty hearing?: No Does the patient have difficulty seeing, even when wearing glasses/contacts?: No Does the  patient have difficulty concentrating, remembering, or making decisions?: No Patient able to express need for assistance with ADLs?: Yes Does the patient have difficulty dressing or bathing?: Yes Independently performs ADLs?: Yes (appropriate for developmental age) Does the patient have difficulty walking or climbing stairs?: Yes Weakness of Legs: Both Weakness of Arms/Hands: None  Permission Sought/Granted   Emotional Assessment     Affect (typically observed): Accepting Orientation: : Oriented to Self, Oriented to Situation, Oriented to Place Alcohol / Substance Use: Not Applicable Psych Involvement: No (comment)  Admission diagnosis:  Abdominal pain, generalized [R10.84] Nausea [R11.0] Cellulitis of left leg [L03.116] AKI (acute kidney injury) (HCC) [N17.9] Urinary symptom or sign [R39.9] Patient Active Problem List   Diagnosis Date Noted   Acute diarrhea 06/16/2022   C. 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   DVT (deep venous thrombosis) (HCC) 03/23/2019   Venous stasis 01/02/2019   Weakness of both lower extremities    Abnormal MRI    Bacteria  in urine    Transaminitis    Bilateral leg weakness 11/13/2018   Venous stasis ulcer (HCC) 11/13/2018   Petechial rash    Purpura of skin due to vascular fragility (HCC)    Wounds, multiple    Severe protein-calorie malnutrition (HCC)    Abnormal EKG 04/20/2018    Abdominal aortic aneurysm (AAA) without rupture (HCC) 04/20/2018   Dyslipidemia 03/24/2017   HTN (hypertension) 06/02/2010   Arthritis 06/02/2010   CAD (coronary artery disease) 06/02/2010   Colon cancer (HCC) 06/02/2010   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 [place] [address] Phone: [phone] Fax: [phone]     Social Determinants of Health (SDOH) Social History: SDOH Screenings   Tobacco Use: Medium Risk (06/15/2022)   SDOH Interventions:    Readmission Risk Interventions    06/17/2022    1:09 PM  Readmission Risk Prevention Plan  Post Dischage Appt Not Complete  Medication Screening Complete  Transportation Screening Complete

## 2022-06-17 NOTE — Plan of Care (Signed)
  Problem: Acute Rehab PT Goals(only PT should resolve) Goal: Pt Will Go Supine/Side To Sit Outcome: Progressing Flowsheets (Taken 06/17/2022 1458) Pt will go Supine/Side to Sit:  with minimal assist  with moderate assist Goal: Patient Will Transfer Sit To/From Stand Outcome: Progressing Flowsheets (Taken 06/17/2022 1458) Patient will transfer sit to/from stand:  with minimal assist  with moderate assist Goal: Pt Will Transfer Bed To Chair/Chair To Bed Outcome: Progressing Flowsheets (Taken 06/17/2022 1458) Pt will Transfer Bed to Chair/Chair to Bed:  with min assist  with mod assist Goal: Pt Will Ambulate Outcome: Progressing Flowsheets (Taken 06/17/2022 1458) Pt will Ambulate:  15 feet  with moderate assist  with rolling walker   2:58 PM, 06/17/22 Ocie Bob, MPT Physical Therapist with [facility] [phone] office [phone] mobile phone

## 2022-06-18 DIAGNOSIS — R197 Diarrhea, unspecified: Secondary | ICD-10-CM | POA: Diagnosis not present

## 2022-06-18 DIAGNOSIS — N179 Acute kidney failure, unspecified: Secondary | ICD-10-CM | POA: Diagnosis not present

## 2022-06-18 DIAGNOSIS — J9621 Acute and chronic respiratory failure with hypoxia: Secondary | ICD-10-CM | POA: Diagnosis not present

## 2022-06-18 DIAGNOSIS — I10 Essential (primary) hypertension: Secondary | ICD-10-CM | POA: Diagnosis not present

## 2022-06-18 LAB — MAGNESIUM: Magnesium: 1.5 mg/dL — ABNORMAL LOW (ref 1.7–2.4)

## 2022-06-18 LAB — CBC
HCT: 27.1 % — ABNORMAL LOW (ref 39.0–52.0)
Hemoglobin: 8.7 g/dL — ABNORMAL LOW (ref 13.0–17.0)
MCH: 31.8 pg (ref 26.0–34.0)
MCHC: 32.1 g/dL (ref 30.0–36.0)
MCV: 98.9 fL (ref 80.0–100.0)
Platelets: 366 10*3/uL (ref 150–400)
RBC: 2.74 MIL/uL — ABNORMAL LOW (ref 4.22–5.81)
RDW: 14.6 % (ref 11.5–15.5)
WBC: 10.4 10*3/uL (ref 4.0–10.5)
nRBC: 0 % (ref 0.0–0.2)

## 2022-06-18 LAB — RENAL FUNCTION PANEL
Albumin: 2.6 g/dL — ABNORMAL LOW (ref 3.5–5.0)
Anion gap: 10 (ref 5–15)
BUN: 76 mg/dL — ABNORMAL HIGH (ref 8–23)
CO2: 24 mmol/L (ref 22–32)
Calcium: 7.9 mg/dL — ABNORMAL LOW (ref 8.9–10.3)
Chloride: 107 mmol/L (ref 98–111)
Creatinine, Ser: 4.96 mg/dL — ABNORMAL HIGH (ref 0.61–1.24)
GFR, Estimated: 11 mL/min — ABNORMAL LOW (ref >60)
Glucose, Bld: 130 mg/dL — ABNORMAL HIGH (ref 70–99)
Phosphorus: 5.5 mg/dL — ABNORMAL HIGH (ref 2.5–4.6)
Potassium: 3.4 mmol/L — ABNORMAL LOW (ref 3.5–5.1)
Sodium: 141 mmol/L (ref 135–145)

## 2022-06-18 LAB — CULTURE, BLOOD (ROUTINE X 2): Culture: NO GROWTH

## 2022-06-18 MED ORDER — MAGNESIUM SULFATE 4 GM/100ML IV SOLN
4.0000 g | Freq: Once | INTRAVENOUS | Status: AC
  Administered 2022-06-18: 4 g via INTRAVENOUS
  Filled 2022-06-18: qty 100

## 2022-06-18 MED ORDER — IPRATROPIUM-ALBUTEROL 0.5-2.5 (3) MG/3ML IN SOLN: 3.0000 mL | Freq: Four times a day (QID) | RESPIRATORY_TRACT | Status: DC | PRN

## 2022-06-18 MED ORDER — POTASSIUM CHLORIDE CRYS ER 20 MEQ PO TBCR
40.0000 meq | EXTENDED_RELEASE_TABLET | Freq: Once | ORAL | Status: AC
  Administered 2022-06-18: 40 meq via ORAL
  Filled 2022-06-18: qty 2

## 2022-06-18 MED ORDER — ORAL CARE MOUTH RINSE: 15.0000 mL | OROMUCOSAL | Status: DC | PRN

## 2022-06-18 NOTE — Progress Notes (Signed)
Patient per telemetry had 5-6 beat run of Svt, notified Dr.; Laural Benes

## 2022-06-18 NOTE — Progress Notes (Addendum)
PROGRESS NOTE  [name]:[id number] DOB: Feb 21, 1935 DOA: 07/09/2022 PCP: Verlee Monte, NP  Brief History:  87 year old male with a history of coronary artery disease, hypertension, chronic respiratory failure on 2 L at nighttime, COPD, colorectal adenocarcinoma (09/2009) status post partial colectomy (01/2010)  and chemoradiation with Xeloda.  He finished radiation 12/04/2009.  He had follow-up colonoscopy 12/2013 which was negative.  His CEA tumor markers have been normal.  He states that he has not followed up with oncology in over 5 years at this point. Nevertheless, the patient has been complaining of 2 to 3-week history of abdominal discomfort and distention with difficulty urinating.  He feels like he has incomplete bladder emptying.  He has had some nausea without emesis.  He denies any hematuria or dysuria.  He has had some urinary incontinence.  Regarding his abdominal discomfort, the patient states that it is generalized.  He has had loose stools on a daily basis for the last 2 to 3 weeks.  There is no hematochezia or melena.  He denies any new medications.  He denies any recent travels or exotic foods.  He denies any fevers, chills, chest pain, shortness breath, cough, hemoptysis.  The patient denies any NSAIDs.  In the ED, the patient had low-grade temperature of 99.2 F.  He was hemodynamically stable.  Oxygen saturation was 95% on 2 L.  WBC 15.6, hemoglobin 12.3, platelets 279,000.  LFTs were unremarkable.  Sodium 139, potassium 5.6, bicarbonate 15, serum creatinine 7.04.  UA showed 21-50 WBC. CT of the abdomen pelvis showed LLL consolidation, bilateral hydronephrosis which was already seen on a previous CT chest 06/05/2022, posterior wall bladder thickening with high attenuation material around the Foley catheter, wall thickening @descending  colon at the level of the rectosigmoid.  There is diffuse stranding in the perirectal region surrounding the prostate and bladder.   There was bilateral inguinal and retroperitoneal lymphadenopathy. The patient was admitted for further evaluation and treatment of his acute kidney injury and colitis.  A Foley catheter was inserted in the emergency department. Renal function did not improve, so nephrology and urology were consulted.     Assessment/Plan: AKI -Last serum creatinine 0.80 on 08/15/2019 -secondary to obstructive uropathy -Obtain renal ultrasound--persistent hydronephrosis -Bilateral hydronephrosis seen on CT abdomen -No improvement in serum creatinine after Foley insertion -Nephrology consult appreciated -Accurate I's and O's -Appears hypervolemic>>echo -5/7 Echo--EF 55-60, no WMA, G1DD, normal RVF -06/15/22--bilateral nephrostomy tubes placed   Hyperkalemia - treated and resolved  -Patient was started on bicarbonate drip -Given Lokelma -resolved    Colitis -Descending colon and perirectal thickening as discussed above -GI consulted and now signed off -Stool pathogen panel--neg -Stool for C. Difficile-neg -Continue empiric ceftriaxone and metronidazole x 7 days per GI service    Bilateral hydronephrosis/Bladder mass -Foley inserted--did not resolve -due to bladder mass causing obstructive uropathy -Renal ultrasound post Foley insertion--persistent hydronephrosis -06/15/22--bilateral nephrostomy tubes placed -Dr. Ronne Binning following>>will ultimately need cystoscopy -Dr. Ronne Binning planning cystoscopy to be done 1 week after he discharge from [facility]   Lobar pneumonia -5/5 CT abd--shows LLL consolidation -continue ceftriaxone   Left leg cellulitis -Appears streptococcal -Venous duplex-neg -Continue ceftriaxone -improved with antibiotics   Hypomagnesemia -IV replacement ordered -recheck Mg in AM    Essential hypertension -Restart metoprolol at lower dose -Hold HCTZ -Hold benazepril -Holding amlodipine temporarily -BP remains controlled   Acute on chronic respiratory failure with  hypoxia -Chronically on 2 L at nighttime -Secondary  to pulmonary edema and pneumonia -Discontinued bicarbonate drip   COPD -Continue bronchodilators   Coronary artery disease -No chest pain presently -Continue metoprolol   Mixed hyperlipidemia -Continue statin   Obesity -BMI 32.10 -lifestyle modification     Family Communication:  family at bedside 5/8, 5/9   Consultants:  renal, urology, GI   Code Status:  FULL   DVT Prophylaxis:  Ullin Heparin     Procedures: As Listed in Progress Note Above   Antibiotics: Ceftriaxone 5/5>> Metronidazole 5/5>>   Subjective: No specific complaints.     Objective: Vitals:   06/17/22 1940 06/17/22 2153 06/18/22 0441 06/18/22 1428  BP:  106/60 (!) 140/70 123/86  Pulse:  82 77 80  Resp:  18 18 18   Temp:  98.6 F (37 C) 98.3 F (36.8 C) 98.6 F (37 C)  TempSrc:  Oral  Oral  SpO2: 94% 94% 94% 96%  Weight:      Height:        Intake/Output Summary (Last 24 hours) at 06/18/2022 1504 Last data filed at 06/18/2022 1300 Gross per 24 hour  Intake 940 ml  Output 4230 ml  Net -3290 ml   Weight change:  Exam:  General:  Pt is alert, follows commands appropriately, not in acute distress HEENT: No icterus, No thrush, No neck mass, [place]/AT Cardiovascular: normal S1/S2, no rubs, no gallops Respiratory: bibasilar rales.  No wheeze Abdomen: bilateral urostomy tubes seen with blood-tinged fluid in both bags.  R>L. Soft/+BS, non tender, non distended, no guarding Extremities: No edema, No lymphangitis, No petechiae, No rashes, no synovitis  Data Reviewed: I have personally reviewed following labs and imaging studies Basic Metabolic Panel: Recent Labs  Lab 06/24/2022 1846 06/15/22 0346 06/16/22 0412 06/17/22 0410 06/18/22 0431  NA 138 139 142 142 141  K 5.7* 4.8 4.4 3.8 3.4*  CL 108 108 108 108 107  CO2 13* 16* 20* 21* 24  GLUCOSE 106* 134* 101* 109* 130*  BUN 105* 104* 97* 87* 76*  CREATININE 7.06* 7.04* 6.77* 5.91* 4.96*   CALCIUM 8.6* 8.5* 8.4* 8.2* 7.9*  MG  --   --   --   --  1.5*  PHOS  --   --  7.6* 6.3* 5.5*   Liver Function Tests: Recent Labs  Lab 06/28/2022 1459 06/15/22 0346 06/16/22 0412 06/17/22 0410 06/18/22 0431  AST 13* 9*  --   --   --   ALT 11 9  --   --   --   ALKPHOS 63 45  --   --   --   BILITOT 0.9 0.6  --   --   --   PROT 7.3 6.2*  --   --   --   ALBUMIN 3.5 2.8* 2.7* 2.7* 2.6*   Recent Labs  Lab 06/23/2022 1459  LIPASE 30   No results for input(s): "AMMONIA" in the last 168 hours. Coagulation Profile: Recent Labs  Lab 06/15/22 1352  INR 1.2   CBC: Recent Labs  Lab  1459 06/15/22 0346 06/16/22 0412 06/17/22 0410 06/18/22 0431  WBC 15.6* 14.2* 13.1* 11.8* 10.4  HGB 12.3* 9.9* 9.5* 9.4* 8.7*  HCT 37.9* 30.8* 29.2* 29.6* 27.1*  MCV 96.7 96.6 96.4 96.7 98.9  PLT 461* 379 383 394 366   Cardiac Enzymes: No results for input(s): "CKTOTAL", "CKMB", "CKMBINDEX", "TROPONINI" in the last 168 hours. BNP: Invalid input(s): "POCBNP" CBG: No results for input(s): "GLUCAP" in the last 168 hours. HbA1C: No results for input(s): "HGBA1C" in  the last 72 hours. Urine analysis:    Component Value Date/Time   COLORURINE YELLOW 06/27/2022 1455   APPEARANCEUR CLOUDY (A) 06/11/2022 1455   LABSPEC 1.011 06/18/2022 1455   PHURINE 5.0 06/13/2022 1455   GLUCOSEU NEGATIVE 06/10/2022 1455   HGBUR LARGE (A) 06/29/2022 1455   BILIRUBINUR NEGATIVE 06/24/2022 1455   KETONESUR 5 (A) 07/07/2022 1455   PROTEINUR 100 (A) 06/30/2022 1455   NITRITE NEGATIVE 07/02/2022 1455   LEUKOCYTESUR MODERATE (A) 07/04/2022 1455   Recent Results (from the past 240 hour(s))  Blood culture (routine x 2)     Status: None (Preliminary result)   Collection Time: 07/06/2022  2:59 PM   Specimen: Right Antecubital; Blood  Result Value Ref Range Status   Specimen Description   Final    RIGHT ANTECUBITAL BOTTLES DRAWN AEROBIC AND ANAEROBIC   Special Requests   Final    Blood Culture results may not  be optimal due to an excessive volume of blood received in culture bottles   Culture   Final    NO GROWTH 4 DAYS Performed at [facility], [address] [place], [place] [address]    Report Status PENDING  Incomplete  Blood culture (routine x 2)     Status: Abnormal   Collection Time: 07/04/2022  3:12 PM   Specimen: Left Antecubital; Blood  Result Value Ref Range Status   Specimen Description   Final    LEFT ANTECUBITAL BOTTLES DRAWN AEROBIC AND ANAEROBIC Performed at [facility], [address] [place], [place] [address]    Special Requests   Final    Blood Culture results may not be optimal due to an excessive volume of blood received in culture bottles Performed at [facility], [address], [place], [place] [address]    Culture  Setup Time   Final    GRAM POSITIVE COCCI Gram Stain Report Called to,Read Back By and Verified With: M. CATES LAND @ 06/15/22 ANAEROBIC BOTTLE ONLY CRITICAL RESULT CALLED TO, READ BACK BY AND VERIFIED WITH: PHARMD LAURIE POOLE ON 06/15/22 @ 1715 BY DRT    Culture (A)  Final    AEROCOCCUS VIRIDANS Standardized susceptibility testing for this organism is not available. Performed at [facility], [address] [place], [place] [address]    Report Status 06/17/2022 FINAL  Final  Blood Culture ID Panel (Reflexed)     Status: None   Collection Time: 06/13/2022  3:12 PM  Result Value Ref Range Status   Enterococcus faecalis NOT DETECTED NOT DETECTED Final   Enterococcus Faecium NOT DETECTED NOT DETECTED Final   Listeria monocytogenes NOT DETECTED NOT DETECTED Final   Staphylococcus species NOT DETECTED NOT DETECTED Final   Staphylococcus aureus (BCID) NOT DETECTED NOT DETECTED Final   Staphylococcus epidermidis NOT DETECTED NOT DETECTED Final   Staphylococcus lugdunensis NOT DETECTED NOT DETECTED Final   Streptococcus species NOT DETECTED NOT DETECTED Final   Streptococcus agalactiae NOT DETECTED NOT DETECTED Final   Streptococcus pneumoniae NOT  DETECTED NOT DETECTED Final   Streptococcus pyogenes NOT DETECTED NOT DETECTED Final   A.calcoaceticus-baumannii NOT DETECTED NOT DETECTED Final   Bacteroides fragilis NOT DETECTED NOT DETECTED Final   Enterobacterales NOT DETECTED NOT DETECTED Final   Enterobacter cloacae complex NOT DETECTED NOT DETECTED Final   Escherichia coli NOT DETECTED NOT DETECTED Final   Klebsiella aerogenes NOT DETECTED NOT DETECTED Final   Klebsiella oxytoca NOT DETECTED NOT DETECTED Final   Klebsiella pneumoniae NOT DETECTED NOT DETECTED Final  Proteus species NOT DETECTED NOT DETECTED Final   Salmonella species NOT DETECTED NOT DETECTED Final   Serratia marcescens NOT DETECTED NOT DETECTED Final   Haemophilus influenzae NOT DETECTED NOT DETECTED Final   Neisseria meningitidis NOT DETECTED NOT DETECTED Final   Pseudomonas aeruginosa NOT DETECTED NOT DETECTED Final   Stenotrophomonas maltophilia NOT DETECTED NOT DETECTED Final   Candida albicans NOT DETECTED NOT DETECTED Final   Candida auris NOT DETECTED NOT DETECTED Final   Candida glabrata NOT DETECTED NOT DETECTED Final   Candida krusei NOT DETECTED NOT DETECTED Final   Candida parapsilosis NOT DETECTED NOT DETECTED Final   Candida tropicalis NOT DETECTED NOT DETECTED Final   Cryptococcus neoformans/gattii NOT DETECTED NOT DETECTED Final    Comment: Performed at [facility], [address], [place], [place] [address]  Urine Culture     Status: Abnormal   Collection Time: 07/10/2022  4:05 PM   Specimen: Urine, Clean Catch  Result Value Ref Range Status   Specimen Description   Final    URINE, CLEAN CATCH Performed at [facility], [address], [place], [place] [address]    Special Requests   Final    NONE Performed at [facility], [address] [place], [place] [address]    Culture MULTIPLE SPECIES PRESENT, SUGGEST RECOLLECTION (A)  Final   Report Status 06/15/2022 FINAL  Final  Gastrointestinal Panel by PCR , Stool     Status: None    Collection Time: 06/15/22  2:14 PM   Specimen: STOOL  Result Value Ref Range Status   Campylobacter species NOT DETECTED NOT DETECTED Final   Plesimonas shigelloides NOT DETECTED NOT DETECTED Final   Salmonella species NOT DETECTED NOT DETECTED Final   Yersinia enterocolitica NOT DETECTED NOT DETECTED Final   Vibrio species NOT DETECTED NOT DETECTED Final   Vibrio cholerae NOT DETECTED NOT DETECTED Final   Enteroaggregative E coli (EAEC) NOT DETECTED NOT DETECTED Final   Enteropathogenic E coli (EPEC) NOT DETECTED NOT DETECTED Final   Enterotoxigenic E coli (ETEC) NOT DETECTED NOT DETECTED Final   Shiga like toxin producing E coli (STEC) NOT DETECTED NOT DETECTED Final   Shigella/Enteroinvasive E coli (EIEC) NOT DETECTED NOT DETECTED Final   Cryptosporidium NOT DETECTED NOT DETECTED Final   Cyclospora cayetanensis NOT DETECTED NOT DETECTED Final   Entamoeba histolytica NOT DETECTED NOT DETECTED Final   Giardia lamblia NOT DETECTED NOT DETECTED Final   Adenovirus F40/41 NOT DETECTED NOT DETECTED Final   Astrovirus NOT DETECTED NOT DETECTED Final   Norovirus GI/GII NOT DETECTED NOT DETECTED Final   Rotavirus A NOT DETECTED NOT DETECTED Final   Sapovirus (I, II, IV, and V) NOT DETECTED NOT DETECTED Final    Comment: Performed at [facility], [address] [place], [place] [address]  C Difficile Quick Screen w PCR reflex     Status: None   Collection Time: 06/15/22  2:14 PM   Specimen: STOOL  Result Value Ref Range Status   C Diff antigen NEGATIVE NEGATIVE Final   C Diff toxin NEGATIVE NEGATIVE Final   C Diff interpretation No C. difficile detected.  Final    Comment: Performed at [facility], [address], [place], [place] [address]     Scheduled Meds:  Chlorhexidine Gluconate Cloth  6 each Topical Daily   Gerhardt's butt cream   Topical BID   heparin  5,000 Units Subcutaneous Q12H   metoprolol tartrate  12.5 mg Oral BID   Continuous Infusions:  cefTRIAXone  (ROCEPHIN)  IV 1 g (06/17/22 1717)   metronidazole 500 mg (06/18/22 0826)    Procedures/Studies: ECHOCARDIOGRAM COMPLETE  Result Date: 06/16/2022    ECHOCARDIOGRAM REPORT   Patient Name:   KYI SHOUP Date of Exam: 06/16/2022 Medical Rec #:  [id number]     Height:       74.0 in Accession #:    [id number]    Weight:       250.0 lb Date of Birth:  29-May-1935      BSA:          2.390 m Patient Age:    87 years      BP:           144/75 mmHg Patient Gender: M             HR:           94 bpm. [facility] Procedure: 2D Echo, Cardiac Doppler, Color Doppler and Intracardiac            Opacification Agent Indications:    CHF-Acute Diastolic I50.31  History:        Patient has prior history of Echocardiogram examinations, most                 recent 11/19/2018. CAD, Signs/Symptoms:Shortness of Breath; Risk                 Factors:Hypertension, Dyslipidemia and Former Smoker.  Sonographer:    Aron Baba Referring Phys: [phone] DAVID TAT  Sonographer Comments: Technically difficult study due to poor echo windows. Image acquisition challenging due to COPD. IMPRESSIONS  1. Left ventricular ejection fraction, by estimation, is 55 to 60%. The left ventricle has normal function. The left ventricle has no regional wall motion abnormalities. There is mild left ventricular hypertrophy. Left ventricular diastolic parameters are consistent with Grade I diastolic dysfunction (impaired relaxation).  2. RV not well visualized. Grossly appears normal in size and function. . Right ventricular systolic function was not well visualized. The right ventricular size is not well visualized. There is normal pulmonary artery systolic pressure.  3. Left atrial size was mildly dilated.  4. The mitral valve is normal in structure. No evidence of mitral valve regurgitation. No evidence of mitral stenosis.  5. The aortic valve is tricuspid. There is moderate calcification of the aortic valve. There is moderate thickening of the aortic  valve. Aortic valve regurgitation is not visualized. No aortic stenosis is present.  6. There is mild dilatation of the ascending aorta, measuring 43 mm.  7. The inferior vena cava is normal in size with greater than 50% respiratory variability, suggesting right atrial pressure of 3 mmHg. FINDINGS  Left Ventricle: Left ventricular ejection fraction, by estimation, is 55 to 60%. The left ventricle has normal function. The left ventricle has no regional wall motion abnormalities. Definity contrast agent was given IV to delineate the left ventricular  endocardial borders. The left ventricular internal cavity size was normal in size. There is mild left ventricular hypertrophy. Left ventricular diastolic parameters are consistent with Grade I diastolic dysfunction (impaired relaxation). Normal left ventricular filling pressure. Right Ventricle: RV not well visualized. Grossly appears normal in size and function. The right ventricular size is not well visualized. Right vetricular wall thickness was not well visualized. Right ventricular systolic function was not well visualized.  There is normal pulmonary artery systolic pressure. The tricuspid regurgitant velocity is 1.62 m/s, and with an assumed right atrial pressure of 3  mmHg, the estimated right ventricular systolic pressure is 13.5 mmHg. Left Atrium: Left atrial size was mildly dilated. Right Atrium: Right atrial size was normal in size. Pericardium: There is no evidence of pericardial effusion. Mitral Valve: The mitral valve is normal in structure. There is mild thickening of the mitral valve leaflet(s). There is mild calcification of the mitral valve leaflet(s). Mild mitral annular calcification. No evidence of mitral valve regurgitation. No evidence of mitral valve stenosis. Tricuspid Valve: The tricuspid valve is normal in structure. Tricuspid valve regurgitation is not demonstrated. No evidence of tricuspid stenosis. Aortic Valve: The aortic valve is tricuspid.  There is moderate calcification of the aortic valve. There is moderate thickening of the aortic valve. There is moderate aortic valve annular calcification. Aortic valve regurgitation is not visualized. No aortic stenosis is present. Aortic valve mean gradient measures 9.0 mmHg. Aortic valve peak gradient measures 17.6 mmHg. Aortic valve area, by VTI measures 2.99 cm. Pulmonic Valve: The pulmonic valve was not well visualized. Pulmonic valve regurgitation is not visualized. No evidence of pulmonic stenosis. Aorta: The aortic root is normal in size and structure. There is mild dilatation of the ascending aorta, measuring 43 mm. Venous: The inferior vena cava is normal in size with greater than 50% respiratory variability, suggesting right atrial pressure of 3 mmHg. IAS/Shunts: No atrial level shunt detected by color flow Doppler.  LEFT VENTRICLE PLAX 2D LVIDd:         4.10 cm   Diastology LVIDs:         3.00 cm   LV e' medial:    4.88 cm/s LV PW:         1.30 cm   LV E/e' medial:  15.5 LV IVS:        1.10 cm   LV e' lateral:   9.67 cm/s LVOT diam:     2.40 cm   LV E/e' lateral: 7.8 LV SV:         102 LV SV Index:   43 LVOT Area:     4.52 cm  LEFT ATRIUM           Index        RIGHT ATRIUM           Index LA diam:      4.00 cm 1.67 cm/m   RA Area:     17.10 cm LA Vol (A2C): 77.0 ml 32.22 ml/m  RA Volume:   32.70 ml  13.68 ml/m LA Vol (A4C): 92.1 ml 38.54 ml/m  AORTIC VALVE                     PULMONIC VALVE AV Area (Vmax):    2.95 cm      PR End Diast Vel: 6.97 msec AV Area (Vmean):   2.70 cm AV Area (VTI):     2.99 cm AV Vmax:           210.00 cm/s AV Vmean:          137.500 cm/s AV VTI:            0.340 m AV Peak Grad:      17.6 mmHg AV Mean Grad:      9.0 mmHg LVOT Vmax:         137.00 cm/s LVOT Vmean:        82.000 cm/s LVOT VTI:          0.225 m LVOT/AV VTI ratio: 0.66  AORTA Ao Root diam: 3.80  cm Ao Asc diam:  4.30 cm MITRAL VALVE                TRICUSPID VALVE MV Area (PHT): 2.84 cm     TR Peak grad:    10.5 mmHg MV Decel Time: 267 msec     TR Vmax:        162.00 cm/s MV E velocity: 75.70 cm/s MV A velocity: 107.00 cm/s  SHUNTS MV E/A ratio:  0.71         Systemic VTI:  0.22 m                             Systemic Diam: 2.40 cm Dina Rich MD Electronically signed by Dina Rich MD Signature Date/Time: 06/16/2022/12:06:51 PM    Final    IR NEPHROSTOMY PLACEMENT LEFT  Result Date: 06/15/2022 INDICATION: 87 year old male with bilateral hydronephrosis and acute renal failure. He presents for bilateral percutaneous nephrostomy tube placement. EXAM: IR NEPHROSTOMY PLACEMENT LEFT; IR NEPHROSTOMY PLACEMENT RIGHT COMPARISON:  None Available. MEDICATIONS: Rocephin 2 g IV; The antibiotic was administered in an appropriate time frame prior to skin puncture. ANESTHESIA/SEDATION: Fentanyl 100 mcg IV; Versed 2 mg IV Moderate Sedation Time:  25 minutes The patient's vital signs and level of consciousness were continuously monitored during the procedure by the interventional radiology nurse under my direct supervision. CONTRAST:  20mL OMNIPAQUE IOHEXOL 300 MG/ML SOLN - administered into the collecting system(s) FLUOROSCOPY: Fluoroscopy Time:  minutes  seconds ( mGy). COMPLICATIONS: None immediate. TECHNIQUE: The procedure, risks, benefits, and alternatives were explained to the patient. Questions regarding the procedure were encouraged and answered. The patient understands and consents to the procedure. LEFT The left flank was prepped with chlorhexidine in a sterile fashion, and a sterile drape was applied covering the operative field. A sterile gown and sterile gloves were used for the procedure. Local anesthesia was provided with 1% Lidocaine. The left flank was interrogated with ultrasound and the left kidney identified. The kidney is hydronephrotic. A suitable access site on the skin overlying the lower pole, posterior calix was identified. After local mg anesthesia was achieved, a small skin nick was made with an 11  blade scalpel. A 21 gauge Accustick needle was then advanced under direct sonographic guidance into the lower pole of the left kidney. A 0.018 inch wire was advanced under fluoroscopic guidance into the left renal collecting system. The Accustick sheath was then advanced over the wire and a 0.018 system exchanged for a 0.035 system. Gentle hand injection of contrast material confirms placement of the sheath within the renal collecting system. There is severe hydronephrosis. The tract from the scan into the renal collecting system was then dilated serially to 10-French. A 10-French Cook all-purpose drain was then placed and positioned under fluoroscopic guidance. The locking loop is well formed within the left renal pelvis. The catheter was secured to the skin with 2-0 Prolene and a sterile bandage was placed. Catheter was left to gravity bag drainage. RIGHT The right flank was prepped with chlorhexidine in a sterile fashion, and a sterile drape was applied covering the operative field. A sterile gown and sterile gloves were used for the procedure. Local anesthesia was provided with 1% Lidocaine. The right flank was interrogated with ultrasound and the left kidney identified. The kidney is hydronephrotic. There are multiple large cysts overlying the posterior calices. A suitable access site on the skin overlying the lower pole, posterior calix was identified. After  local anesthesia was achieved, a small skin nick was made with an 11 blade scalpel. A 21 gauge Accustick needle was then advanced under direct sonographic guidance into the cyst. Aspiration was performed. The cyst was decompressed. Next, the Accustick needle was advanced into the calyx within the lower pole of the right kidney. A 0.018 inch wire was advanced under fluoroscopic guidance into the left renal collecting system. The Accustick sheath was then advanced over the wire and a 0.018 system exchanged for a 0.035 system. Gentle hand injection of  contrast material confirms placement of the sheath within the renal collecting system. There is severe hydronephrosis. The tract from the scan into the renal collecting system was then dilated serially to 10-French. A 10-French Cook all-purpose drain was then placed and positioned under fluoroscopic guidance. The locking loop is well formed within the left renal pelvis. The catheter was secured to the skin with 2-0 Prolene and a sterile bandage was placed. Catheter was left to gravity bag drainage. IMPRESSION: Successful placement of a bilateral 10 French percutaneous nephrostomy tubes. Electronically Signed   By: Malachy Moan M.D.   On: 06/15/2022 17:34   IR NEPHROSTOMY PLACEMENT RIGHT  Result Date: 06/15/2022 INDICATION: 87 year old male with bilateral hydronephrosis and acute renal failure. He presents for bilateral percutaneous nephrostomy tube placement. EXAM: IR NEPHROSTOMY PLACEMENT LEFT; IR NEPHROSTOMY PLACEMENT RIGHT COMPARISON:  None Available. MEDICATIONS: Rocephin 2 g IV; The antibiotic was administered in an appropriate time frame prior to skin puncture. ANESTHESIA/SEDATION: Fentanyl 100 mcg IV; Versed 2 mg IV Moderate Sedation Time:  25 minutes The patient's vital signs and level of consciousness were continuously monitored during the procedure by the interventional radiology nurse under my direct supervision. CONTRAST:  20mL OMNIPAQUE IOHEXOL 300 MG/ML SOLN - administered into the collecting system(s) FLUOROSCOPY: Fluoroscopy Time:  minutes  seconds ( mGy). COMPLICATIONS: None immediate. TECHNIQUE: The procedure, risks, benefits, and alternatives were explained to the patient. Questions regarding the procedure were encouraged and answered. The patient understands and consents to the procedure. LEFT The left flank was prepped with chlorhexidine in a sterile fashion, and a sterile drape was applied covering the operative field. A sterile gown and sterile gloves were used for the procedure. Local  anesthesia was provided with 1% Lidocaine. The left flank was interrogated with ultrasound and the left kidney identified. The kidney is hydronephrotic. A suitable access site on the skin overlying the lower pole, posterior calix was identified. After local mg anesthesia was achieved, a small skin nick was made with an 11 blade scalpel. A 21 gauge Accustick needle was then advanced under direct sonographic guidance into the lower pole of the left kidney. A 0.018 inch wire was advanced under fluoroscopic guidance into the left renal collecting system. The Accustick sheath was then advanced over the wire and a 0.018 system exchanged for a 0.035 system. Gentle hand injection of contrast material confirms placement of the sheath within the renal collecting system. There is severe hydronephrosis. The tract from the scan into the renal collecting system was then dilated serially to 10-French. A 10-French Cook all-purpose drain was then placed and positioned under fluoroscopic guidance. The locking loop is well formed within the left renal pelvis. The catheter was secured to the skin with 2-0 Prolene and a sterile bandage was placed. Catheter was left to gravity bag drainage. RIGHT The right flank was prepped with chlorhexidine in a sterile fashion, and a sterile drape was applied covering the operative field. A sterile gown and sterile gloves  were used for the procedure. Local anesthesia was provided with 1% Lidocaine. The right flank was interrogated with ultrasound and the left kidney identified. The kidney is hydronephrotic. There are multiple large cysts overlying the posterior calices. A suitable access site on the skin overlying the lower pole, posterior calix was identified. After local anesthesia was achieved, a small skin nick was made with an 11 blade scalpel. A 21 gauge Accustick needle was then advanced under direct sonographic guidance into the cyst. Aspiration was performed. The cyst was decompressed. Next,  the Accustick needle was advanced into the calyx within the lower pole of the right kidney. A 0.018 inch wire was advanced under fluoroscopic guidance into the left renal collecting system. The Accustick sheath was then advanced over the wire and a 0.018 system exchanged for a 0.035 system. Gentle hand injection of contrast material confirms placement of the sheath within the renal collecting system. There is severe hydronephrosis. The tract from the scan into the renal collecting system was then dilated serially to 10-French. A 10-French Cook all-purpose drain was then placed and positioned under fluoroscopic guidance. The locking loop is well formed within the left renal pelvis. The catheter was secured to the skin with 2-0 Prolene and a sterile bandage was placed. Catheter was left to gravity bag drainage. IMPRESSION: Successful placement of a bilateral 10 French percutaneous nephrostomy tubes. Electronically Signed   By: Malachy Moan M.D.   On: 06/15/2022 17:34   US RENAL  Result Date: 06/15/2022 CLINICAL DATA:  [id number] AKI (acute kidney injury) (HCC) [id number] EXAM: RENAL / URINARY TRACT ULTRASOUND COMPLETE COMPARISON:  CT 06/17/2022 FINDINGS: Right Kidney: Renal measurements: 13.1 x 7.0 x 7.6 cm = volume: 363.7 mL. Persistent moderate hydronephrosis. Left Kidney: Renal measurements: 11.9 x 7.4 x 5.6 cm = volume: 260.1 mL. Persistent moderate hydronephrosis. Bladder: Foley catheter is in place with heterogeneous hyperechoic material adjacent to the Foley balloon, which is avascular. Posterior bladder wall thickening. Other: None. IMPRESSION: Persistent moderate hydronephrosis, similar to recent CT. Intraluminal echogenic material in the bladder which could represent blood clot, correlate with urinalysis. Posterior bladder wall thickening as seen on recent CT. Electronically Signed   By: Caprice Renshaw M.D.   On: 06/15/2022 12:38   US Venous Img Lower Bilateral (DVT)  Result Date: 06/15/2022 CLINICAL DATA:   Bilateral lower extremity pain and edema, left-greater-than-right. Former smoker. Evaluate for DVT. EXAM: BILATERAL LOWER EXTREMITY VENOUS DOPPLER ULTRASOUND TECHNIQUE: Gray-scale sonography with graded compression, as well as color Doppler and duplex ultrasound were performed to evaluate the lower extremity deep venous systems from the level of the common femoral vein and including the common femoral, femoral, profunda femoral, popliteal and calf veins including the posterior tibial, peroneal and gastrocnemius veins when visible. The superficial great saphenous vein was also interrogated. Spectral Doppler was utilized to evaluate flow at rest and with distal augmentation maneuvers in the common femoral, femoral and popliteal veins. COMPARISON:  CT abdomen and pelvis-06/25/2022 FINDINGS: Examination is degraded due to patient body habitus and poor sonographic window RIGHT LOWER EXTREMITY Common Femoral Vein: No evidence of thrombus. Normal compressibility, respiratory phasicity and response to augmentation. Saphenofemoral Junction: No evidence of thrombus. Normal compressibility and flow on color Doppler imaging. Profunda Femoral Vein: No evidence of thrombus. Normal compressibility and flow on color Doppler imaging. Femoral Vein: No evidence of thrombus. Normal compressibility, respiratory phasicity and response to augmentation. Popliteal Vein: No evidence of thrombus. Normal compressibility, respiratory phasicity and response to augmentation. Calf Veins: No evidence of  thrombus. Normal compressibility and flow on color Doppler imaging. Superficial Great Saphenous Vein: No evidence of thrombus. Normal compressibility. Other Findings:  None. LEFT LOWER EXTREMITY Common Femoral Vein: No evidence of thrombus. Normal compressibility, respiratory phasicity and response to augmentation. Saphenofemoral Junction: No evidence of thrombus. Normal compressibility and flow on color Doppler imaging. Profunda Femoral Vein: No  evidence of thrombus. Normal compressibility and flow on color Doppler imaging. Femoral Vein: No evidence of thrombus. Normal compressibility, respiratory phasicity and response to augmentation. Popliteal Vein: No evidence of thrombus. Normal compressibility, respiratory phasicity and response to augmentation. Calf Veins: No evidence of thrombus. Normal compressibility and flow on color Doppler imaging. Superficial Great Saphenous Vein: No evidence of thrombus. Normal compressibility. Other Findings: Pathologically enlarged left inguinal lymph nodes as demonstrated on preceding abdominal CT with index inguinal lymph node measuring 1.3 cm in greatest short axis diameter (image 49). There is a moderate amount of subcutaneous edema at the level of the left lower leg and calf. IMPRESSION: 1. No evidence of DVT within the left lower extremity. 2. Pathologically enlarged left inguinal lymph nodes, similar to preceding abdominal CT performed 06/23/2022. Electronically Signed   By: Simonne Come M.D.   On: 06/15/2022 10:59   DG Chest Port 1V same Day  Result Date: 06/15/2022 CLINICAL DATA:  Dyspnea EXAM: PORTABLE CHEST 1 VIEW COMPARISON:  08/15/2019, CT 06/05/2022 FINDINGS: Stable coarsening of the pulmonary interstitium. The lungs are symmetrically well inflated. Known spiculated pulmonary nodule within the peripheral right upper lobe seen on prior CT examination is not well appreciated on this exam. New retrocardiac opacity in keeping with a focal pulmonary infiltrate within this region, possibly infectious in the acute setting. No pneumothorax or pleural effusion. Cardiac size within normal limits. Central pulmonary vascular congestion without overt pulmonary edema. IMPRESSION: 1. New retrocardiac pulmonary infiltrate, possibly infectious in the acute setting. 2. Central pulmonary vascular congestion without overt pulmonary edema. 3. Known right upper lobe spiculated pulmonary nodule not well appreciated on this exam.  Electronically Signed   By: Helyn Numbers M.D.   On: 06/15/2022 00:48   CT ABDOMEN PELVIS WO CONTRAST  Result Date: 06/29/2022 CLINICAL DATA:  Abdominal pain, nausea and diarrhea, history of colon cancer EXAM: CT ABDOMEN AND PELVIS WITHOUT CONTRAST TECHNIQUE: Multidetector CT imaging of the abdomen and pelvis was performed following the standard protocol without IV contrast. Unenhanced CT was performed per clinician order. Lack of IV contrast limits sensitivity and specificity, especially for evaluation of abdominal/pelvic solid viscera. RADIATION DOSE REDUCTION: This exam was performed according to the departmental dose-optimization program which includes automated exposure control, adjustment of the mA and/or kV according to patient size and/or use of iterative reconstruction technique. COMPARISON:  05/16/2022 FINDINGS: Lower chest: New consolidation within the posterior left lower lobe at the costophrenic angle, which could be atelectasis or acute airspace disease. No pleural effusion. Hepatobiliary: Unremarkable unenhanced appearance of the liver. The gallbladder is moderately distended, without evidence of cholelithiasis or cholecystitis. Pancreas: Unremarkable unenhanced appearance. Spleen: Unremarkable unenhanced appearance. Adrenals/Urinary Tract: Bilateral hydronephrosis is again identified, as seen on recent chest CT. There is also bilateral hydroureter to the level of the base of the bladder. There is marked posterior bladder wall thickening, measuring up to 14 mm. High attenuation material seen within the bladder surrounding the Foley catheter balloon consistent with blood products. Differential diagnosis include cystitis, chronic scarring from previous radiation therapy in a patient with a history of colon cancer, or bladder neoplasm. Urology consultation recommended. There are bilateral nonobstructing  renal calculi, measuring 5 mm on the left and 3 mm on the right. Simple appearing right renal  cysts do not require specific imaging follow-up. The adrenals are unremarkable. Stomach/Bowel: Postsurgical changes are seen from distal colectomy and reanastomosis. There is segmental wall thickening of the descending colon extending to the level of the anastomosis, compatible with inflammatory or infectious colitis. Moderate distension of the colon with retained gas and stool. No evidence of small-bowel obstruction. Vascular/Lymphatic: Lymphadenopathy has developed within the bilateral inguinal regions and retroperitoneum. Index lymph node in the left inguinal region reference image 76/2 measures 15 mm in short axis. Left para-aortic adenopathy measures up to 14 mm in short axis reference image 43/2. There is extensive atherosclerosis throughout the aorta and its distal branches. Evaluation of the vascular lumen is limited without IV contrast. Reproductive: The prostate is enlarged, measuring 5.7 x 4.8 cm in cross-sectional diameter. Other: There is diffuse fat stranding in the perirectal region, surrounding the prostate, and adjacent to the wall thickening at the base of the bladder. This is nonspecific, and could be related to prior radiation therapy. There is no free intraperitoneal fluid or free intraperitoneal gas. There are fat containing midline ventral hernias. The periumbilical fat containing ventral hernia on image 53 demonstrates stranding within the herniated fat, could reflect incarceration. No bowel herniation. There is also evidence of prior ventral hernia repair. Musculoskeletal: No acute or destructive bony lesions. Stable severe right hip osteoarthritis. Reconstructed images demonstrate no additional findings. IMPRESSION: 1. Marked wall thickening of the posterior aspect of the bladder, measuring up to 14 mm. This results in severe bilateral hydroureteronephrosis. Differential diagnosis includes neoplasm, post radiation change, or cystitis. High attenuation material surrounding the Foley catheter  within the bladder lumen consistent with blood products. Urology consultation and cystoscopy recommended. 2. Segmental wall thickening of the distal colon, extending from the splenic flexure through the rectosigmoid anastomosis, suspicious for underlying inflammatory or infectious colitis. 3. New retroperitoneal and bilateral inguinal lymphadenopathy. Metastatic disease not excluded. 4. New dependent consolidation within the left lower lobe posterior costophrenic angle, consistent with pneumonia or atelectasis. 5. Fat containing midline ventral hernias. The inferior ventral hernia demonstrates stranding within the herniated fat, incarcerated hernia not excluded. No bowel herniation. 6. Enlarged prostate. 7. Nonspecific fat stranding within the lower pelvis surrounding the rectosigmoid anastomosis, prostate, and bladder base. This could be sequela of prior radiation therapy. 8.  Aortic Atherosclerosis (ICD10-I70.0). 9. Bilateral nonobstructing renal calculi. Electronically Signed   By: Sharlet Salina M.D.   On: 07/08/2022 17:06   CT Chest Wo Contrast  Result Date: 06/09/2022 CLINICAL DATA:  History of colon cancer. Status post chemotherapy and radiation. Lung nodules. EXAM: CT CHEST WITHOUT CONTRAST TECHNIQUE: Multidetector CT imaging of the chest was performed following the standard protocol without IV contrast. RADIATION DOSE REDUCTION: This exam was performed according to the departmental dose-optimization program which includes automated exposure control, adjustment of the mA and/or kV according to patient size and/or use of iterative reconstruction technique. COMPARISON:  CT 09/26/2021 and older FINDINGS: Cardiovascular: Heart is nonenlarged. No pericardial effusion. Coronary artery calcifications are seen. The thoracic aorta has some scattered vascular calcifications. Tortuous course of the descending thoracic aorta. Mediastinum/Nodes: Small hiatal hernia. Normal caliber thoracic esophagus. The esophagus is  slightly patulous. Heterogeneous thyroid gland. No specific abnormal lymph node enlargement identified in the axillary region or hilum on this noncontrast examination. There are some small mediastinal nodes, nonpathologic by size criteria. This includes retrocrural. Example series 2, image 132 right retrocrural  measures 12 x 9 mm today and previously 12 x 8 mm. Lungs/Pleura: Breathing motion. Dependent atelectasis. No consolidation, pneumothorax or effusion. Upper lung zone centrilobular emphysematous lung changes are identified. Apical pleural thickening on the left-greater-than-right. Spiculated semi-solid nodule seen in the right upper lobe on the prior that measured 16 mm overall, today on series 2, image 62 again measures 16 by 10 mm, unchanged when measured in the same fashion. The solid component more superior the was measured at 9 mm is stable. On the study of December 2021, the lesion is remeasured in the same fashion as today and at that time would have measured 15 x 9 mm. Very similar. Left upper lobe 5 mm nodule at the apex is stable today on series 2, image 32. This lesion has been stable since December 2021. Upper Abdomen: In the upper abdomen the adrenal glands are diffusely thickened and nodular. New collecting system dilatation of the kidneys. Etiology is uncertain. Musculoskeletal: Diffuse degenerative changes seen along the spine. IMPRESSION: Bilateral lung nodule seen previously are similar today going back to a study of December 2021. Long-term stability. No specific additional imaging follow-up of the lung nodules. Underlying emphysematous lung changes. However there is new collecting system dilatation of the kidneys at the edge of the imaging field of uncertain etiology. Recommend dedicated abdominal imaging to confirm etiology and correlate with known history. Findings will be called to the ordering service by the Radiology physician assistant team Aortic Atherosclerosis (ICD10-I70.0) and  Emphysema (ICD10-J43.9). Electronically Signed   By: Karen Kays M.D.   On: 06/09/2022 15:15    Standley Dakins, MD   [facility]  If 7PM-7AM, please contact night-coverage [email or web address] Password [email or web address] 06/18/2022, 3:04 PM   LOS: 4 days

## 2022-06-18 NOTE — Progress Notes (Signed)
Subjective: Patient reports mild bilaterla back pain. 3L UOP in the past 24 hours. Urine clear. Creatinine 5.   Objective: Vital signs in last 24 hours: Temp:  [97.8 F (36.6 C)-98.7 F (37.1 C)] 98.3 F (36.8 C) (05/09 0441) Pulse Rate:  [75-85] 77 (05/09 0441) Resp:  [18-20] 18 (05/09 0441) BP: (88-140)/(59-71) 140/70 (05/09 0441) SpO2:  [94 %-97 %] 94 % (05/09 0441)  Intake/Output from previous day: 05/08 0701 - 05/09 0700 In: 1540 [P.O.:1340; IV Piggyback:200] Out: 2930 [Urine:2930] Intake/Output this shift: Total I/O In: 240 [P.O.:240] Out: 1050 [Urine:1050]  Physical Exam:  General:alert, cooperative, and appears stated age GI: soft, non tender, normal bowel sounds, no palpable masses, no organomegaly, no inguinal hernia Male genitalia: not done Extremities: extremities normal, atraumatic, no cyanosis or edema  Lab Results: Recent Labs    06/16/22 0412 06/17/22 0410 06/18/22 0431  HGB 9.5* 9.4* 8.7*  HCT 29.2* 29.6* 27.1*   BMET Recent Labs    06/17/22 0410 06/18/22 0431  NA 142 141  K 3.8 3.4*  CL 108 107  CO2 21* 24  GLUCOSE 109* 130*  BUN 87* 76*  CREATININE 5.91* 4.96*  CALCIUM 8.2* 7.9*   Recent Labs    06/15/22 1352  INR 1.2   No results for input(s): "LABURIN" in the last 72 hours. Results for orders placed or performed during the hospital encounter of 07/03/2022  Blood culture (routine x 2)     Status: None (Preliminary result)   Collection Time: 06/18/2022  2:59 PM   Specimen: Right Antecubital; Blood  Result Value Ref Range Status   Specimen Description   Final    RIGHT ANTECUBITAL BOTTLES DRAWN AEROBIC AND ANAEROBIC   Special Requests   Final    Blood Culture results may not be optimal due to an excessive volume of blood received in culture bottles   Culture   Final    NO GROWTH 4 DAYS Performed at [facility], [address] [place], [place] [address]    Report Status PENDING  Incomplete  Blood culture (routine x 2)     Status:  Abnormal   Collection Time: 06/12/2022  3:12 PM   Specimen: Left Antecubital; Blood  Result Value Ref Range Status   Specimen Description   Final    LEFT ANTECUBITAL BOTTLES DRAWN AEROBIC AND ANAEROBIC Performed at [facility], [address], [place], [place] [address]    Special Requests   Final    Blood Culture results may not be optimal due to an excessive volume of blood received in culture bottles Performed at [facility], [address], [place], [place] [address]    Culture  Setup Time   Final    GRAM POSITIVE COCCI Gram Stain Report Called to,Read Back By and Verified With: M. CATES LAND @ 06/15/22 ANAEROBIC BOTTLE ONLY CRITICAL RESULT CALLED TO, READ BACK BY AND VERIFIED WITH: PHARMD LAURIE POOLE ON 06/15/22 @ 1715 BY DRT    Culture (A)  Final    AEROCOCCUS VIRIDANS Standardized susceptibility testing for this organism is not available. Performed at [facility], [address] [place], [place] [address]    Report Status 06/17/2022 FINAL  Final  Blood Culture ID Panel (Reflexed)     Status: None   Collection Time: 07/04/2022  3:12 PM  Result Value Ref Range Status   Enterococcus faecalis NOT DETECTED NOT DETECTED Final   Enterococcus Faecium NOT DETECTED NOT DETECTED Final   Listeria monocytogenes NOT DETECTED NOT DETECTED Final  Staphylococcus species NOT DETECTED NOT DETECTED Final   Staphylococcus aureus (BCID) NOT DETECTED NOT DETECTED Final   Staphylococcus epidermidis NOT DETECTED NOT DETECTED Final   Staphylococcus lugdunensis NOT DETECTED NOT DETECTED Final   Streptococcus species NOT DETECTED NOT DETECTED Final   Streptococcus agalactiae NOT DETECTED NOT DETECTED Final   Streptococcus pneumoniae NOT DETECTED NOT DETECTED Final   Streptococcus pyogenes NOT DETECTED NOT DETECTED Final   A.calcoaceticus-baumannii NOT DETECTED NOT DETECTED Final   Bacteroides fragilis NOT DETECTED NOT DETECTED Final   Enterobacterales NOT DETECTED NOT DETECTED Final    Enterobacter cloacae complex NOT DETECTED NOT DETECTED Final   Escherichia coli NOT DETECTED NOT DETECTED Final   Klebsiella aerogenes NOT DETECTED NOT DETECTED Final   Klebsiella oxytoca NOT DETECTED NOT DETECTED Final   Klebsiella pneumoniae NOT DETECTED NOT DETECTED Final   Proteus species NOT DETECTED NOT DETECTED Final   Salmonella species NOT DETECTED NOT DETECTED Final   Serratia marcescens NOT DETECTED NOT DETECTED Final   Haemophilus influenzae NOT DETECTED NOT DETECTED Final   Neisseria meningitidis NOT DETECTED NOT DETECTED Final   Pseudomonas aeruginosa NOT DETECTED NOT DETECTED Final   Stenotrophomonas maltophilia NOT DETECTED NOT DETECTED Final   Candida albicans NOT DETECTED NOT DETECTED Final   Candida auris NOT DETECTED NOT DETECTED Final   Candida glabrata NOT DETECTED NOT DETECTED Final   Candida krusei NOT DETECTED NOT DETECTED Final   Candida parapsilosis NOT DETECTED NOT DETECTED Final   Candida tropicalis NOT DETECTED NOT DETECTED Final   Cryptococcus neoformans/gattii NOT DETECTED NOT DETECTED Final    Comment: Performed at [facility], [address] [place], [place] [address]  Urine Culture     Status: Abnormal   Collection Time: 07/08/2022  4:05 PM   Specimen: Urine, Clean Catch  Result Value Ref Range Status   Specimen Description   Final    URINE, CLEAN CATCH Performed at [facility], [address] [place], [place] [address]    Special Requests   Final    NONE Performed at [facility], [address] [place], [place] [address]    Culture MULTIPLE SPECIES PRESENT, SUGGEST RECOLLECTION (A)  Final   Report Status 06/15/2022 FINAL  Final  Gastrointestinal Panel by PCR , Stool     Status: None   Collection Time: 06/15/22  2:14 PM   Specimen: STOOL  Result Value Ref Range Status   Campylobacter species NOT DETECTED NOT DETECTED Final   Plesimonas shigelloides NOT DETECTED NOT DETECTED Final   Salmonella species NOT DETECTED NOT DETECTED Final    Yersinia enterocolitica NOT DETECTED NOT DETECTED Final   Vibrio species NOT DETECTED NOT DETECTED Final   Vibrio cholerae NOT DETECTED NOT DETECTED Final   Enteroaggregative E coli (EAEC) NOT DETECTED NOT DETECTED Final   Enteropathogenic E coli (EPEC) NOT DETECTED NOT DETECTED Final   Enterotoxigenic E coli (ETEC) NOT DETECTED NOT DETECTED Final   Shiga like toxin producing E coli (STEC) NOT DETECTED NOT DETECTED Final   Shigella/Enteroinvasive E coli (EIEC) NOT DETECTED NOT DETECTED Final   Cryptosporidium NOT DETECTED NOT DETECTED Final   Cyclospora cayetanensis NOT DETECTED NOT DETECTED Final   Entamoeba histolytica NOT DETECTED NOT DETECTED Final   Giardia lamblia NOT DETECTED NOT DETECTED Final   Adenovirus F40/41 NOT DETECTED NOT DETECTED Final   Astrovirus NOT DETECTED NOT DETECTED Final   Norovirus GI/GII NOT DETECTED NOT DETECTED Final   Rotavirus A NOT DETECTED NOT DETECTED Final   Sapovirus (I,  II, IV, and V) NOT DETECTED NOT DETECTED Final    Comment: Performed at [facility], [address] [place], [place] [address]  C Difficile Quick Screen w PCR reflex     Status: None   Collection Time: 06/15/22  2:14 PM   Specimen: STOOL  Result Value Ref Range Status   C Diff antigen NEGATIVE NEGATIVE Final   C Diff toxin NEGATIVE NEGATIVE Final   C Diff interpretation No C. difficile detected.  Final    Comment: Performed at [facility], [address], [place], [place] [address]    Studies/Results: No results found.  Assessment/Plan: 87yo with bilateral hydronephrosis, bladder mass  I discussed the management of bladder masses with the patient and his wife. We will schedule him for outpatient cystoscopy 1 week after he leaves SNF. Please continue bilateral PCN tubes   LOS: 4 days   Wilkie Aye 06/18/2022, 12:37 PM

## 2022-06-18 NOTE — TOC Progression Note (Signed)
Transition of Care [facility]) - Progression Note    Patient Details  Name: [name] MRN: [id number] Date of Birth: 21-Apr-1935  [facility] CM/SW Contact  Elliot Gault, LCSW Phone Number: 06/18/2022, 2:54 PM  Clinical Narrative:     TOC following. Updated pt and his wife on current bed offer. Sent out for additional snf options at their request.   Will follow.  Expected Discharge Plan: Skilled Nursing Facility Barriers to Discharge: Continued Medical Work up  Expected Discharge Plan and Services     Post Acute Care Choice: Skilled Nursing Facility Living arrangements for the past 2 months: Single Family Home                                       Social Determinants of Health (SDOH) Interventions SDOH Screenings   Tobacco Use: Medium Risk (06/15/2022)    Readmission Risk Interventions    06/17/2022    1:09 PM  Readmission Risk Prevention Plan  Post Dischage Appt Not Complete  Medication Screening Complete  Transportation Screening Complete

## 2022-06-18 NOTE — Progress Notes (Addendum)
Patient ID: [name], male   DOB: [birth year], 87 y.o.   MRN: [id number] S: Feels well, no new complaints.  O:BP (!) 140/70   Pulse 77   Temp 98.3 F (36.8 C)   Resp 18   Ht 6\' 2"  (1.88 m)   Wt 113.4 kg   SpO2 94%   BMI 32.10 kg/m   Intake/Output Summary (Last 24 hours) at 06/18/2022 1046 Last data filed at 06/18/2022 0900 Gross per 24 hour  Intake 940 ml  Output 3580 ml  Net -2640 ml   Intake/Output: I/O last 3 completed shifts: In: 1540 [P.O.:1340; IV Piggyback:200] Out: 5505 [Urine:5505]  Intake/Output this shift:  Total I/O In: -  Out: 1050 [Urine:1050] Weight change:  Gen:NAD CVS: RRR Resp:CTA Abd: +BS, distended, nontender NWG:NFAOZ edema RLE, 1+ edema LLE  Recent Labs  Lab 07/05/2022 1459 07/08/2022 1846 06/15/22 0346 06/16/22 0412 06/17/22 0410 06/18/22 0431  NA 139 138 139 142 142 141  K 5.6* 5.7* 4.8 4.4 3.8 3.4*  CL 107 108 108 108 108 107  CO2 15* 13* 16* 20* 21* 24  GLUCOSE 104* 106* 134* 101* 109* 130*  BUN 105* 105* 104* 97* 87* 76*  CREATININE 7.04* 7.06* 7.04* 6.77* 5.91* 4.96*  ALBUMIN 3.5  --  2.8* 2.7* 2.7* 2.6*  CALCIUM 9.0 8.6* 8.5* 8.4* 8.2* 7.9*  PHOS  --   --   --  7.6* 6.3* 5.5*  AST 13*  --  9*  --   --   --   ALT 11  --  9  --   --   --    Liver Function Tests: Recent Labs  Lab 06/24/2022 1459 06/15/22 0346 06/16/22 0412 06/17/22 0410 06/18/22 0431  AST 13* 9*  --   --   --   ALT 11 9  --   --   --   ALKPHOS 63 45  --   --   --   BILITOT 0.9 0.6  --   --   --   PROT 7.3 6.2*  --   --   --   ALBUMIN 3.5 2.8* 2.7* 2.7* 2.6*   Recent Labs  Lab 07/10/2022 1459  LIPASE 30   No results for input(s): "AMMONIA" in the last 168 hours. CBC: Recent Labs  Lab 06/25/2022 1459 06/15/22 0346 06/16/22 0412 06/17/22 0410 06/18/22 0431  WBC 15.6* 14.2* 13.1* 11.8* 10.4  HGB 12.3* 9.9* 9.5* 9.4* 8.7*  HCT 37.9* 30.8* 29.2* 29.6* 27.1*  MCV 96.7 96.6 96.4 96.7 98.9  PLT 461* 379 383 394 366   Cardiac Enzymes: No results for  input(s): "CKTOTAL", "CKMB", "CKMBINDEX", "TROPONINI" in the last 168 hours. CBG: No results for input(s): "GLUCAP" in the last 168 hours.  Iron Studies: No results for input(s): "IRON", "TIBC", "TRANSFERRIN", "FERRITIN" in the last 72 hours. Studies/Results: ECHOCARDIOGRAM COMPLETE  Result Date: 06/16/2022    ECHOCARDIOGRAM REPORT   Patient Name:   [name] Date of Exam: 06/16/2022 Medical Rec #:  [id number]     Height:       74.0 in Accession #:    [id number]    Weight:       250.0 lb Date of Birth:  March 04, 1935      BSA:          2.390 m Patient Age:    87 years      BP:           144/75 mmHg Patient Gender: M  HR:           94 bpm. [facility] Procedure: 2D Echo, Cardiac Doppler, Color Doppler and Intracardiac            Opacification Agent Indications:    CHF-Acute Diastolic I50.31  History:        Patient has prior history of Echocardiogram examinations, most                 recent 11/19/2018. CAD, Signs/Symptoms:Shortness of Breath; Risk                 Factors:Hypertension, Dyslipidemia and Former Smoker.  Sonographer:    Aron Baba Referring Phys: [phone] DAVID TAT  Sonographer Comments: Technically difficult study due to poor echo windows. Image acquisition challenging due to COPD. IMPRESSIONS  1. Left ventricular ejection fraction, by estimation, is 55 to 60%. The left ventricle has normal function. The left ventricle has no regional wall motion abnormalities. There is mild left ventricular hypertrophy. Left ventricular diastolic parameters are consistent with Grade I diastolic dysfunction (impaired relaxation).  2. RV not well visualized. Grossly appears normal in size and function. . Right ventricular systolic function was not well visualized. The right ventricular size is not well visualized. There is normal pulmonary artery systolic pressure.  3. Left atrial size was mildly dilated.  4. The mitral valve is normal in structure. No evidence of mitral valve regurgitation.  No evidence of mitral stenosis.  5. The aortic valve is tricuspid. There is moderate calcification of the aortic valve. There is moderate thickening of the aortic valve. Aortic valve regurgitation is not visualized. No aortic stenosis is present.  6. There is mild dilatation of the ascending aorta, measuring 43 mm.  7. The inferior vena cava is normal in size with greater than 50% respiratory variability, suggesting right atrial pressure of 3 mmHg. FINDINGS  Left Ventricle: Left ventricular ejection fraction, by estimation, is 55 to 60%. The left ventricle has normal function. The left ventricle has no regional wall motion abnormalities. Definity contrast agent was given IV to delineate the left ventricular  endocardial borders. The left ventricular internal cavity size was normal in size. There is mild left ventricular hypertrophy. Left ventricular diastolic parameters are consistent with Grade I diastolic dysfunction (impaired relaxation). Normal left ventricular filling pressure. Right Ventricle: RV not well visualized. Grossly appears normal in size and function. The right ventricular size is not well visualized. Right vetricular wall thickness was not well visualized. Right ventricular systolic function was not well visualized.  There is normal pulmonary artery systolic pressure. The tricuspid regurgitant velocity is 1.62 m/s, and with an assumed right atrial pressure of 3 mmHg, the estimated right ventricular systolic pressure is 13.5 mmHg. Left Atrium: Left atrial size was mildly dilated. Right Atrium: Right atrial size was normal in size. Pericardium: There is no evidence of pericardial effusion. Mitral Valve: The mitral valve is normal in structure. There is mild thickening of the mitral valve leaflet(s). There is mild calcification of the mitral valve leaflet(s). Mild mitral annular calcification. No evidence of mitral valve regurgitation. No evidence of mitral valve stenosis. Tricuspid Valve: The  tricuspid valve is normal in structure. Tricuspid valve regurgitation is not demonstrated. No evidence of tricuspid stenosis. Aortic Valve: The aortic valve is tricuspid. There is moderate calcification of the aortic valve. There is moderate thickening of the aortic valve. There is moderate aortic valve annular calcification. Aortic valve regurgitation is not visualized. No aortic stenosis is present. Aortic valve  mean gradient measures 9.0 mmHg. Aortic valve peak gradient measures 17.6 mmHg. Aortic valve area, by VTI measures 2.99 cm. Pulmonic Valve: The pulmonic valve was not well visualized. Pulmonic valve regurgitation is not visualized. No evidence of pulmonic stenosis. Aorta: The aortic root is normal in size and structure. There is mild dilatation of the ascending aorta, measuring 43 mm. Venous: The inferior vena cava is normal in size with greater than 50% respiratory variability, suggesting right atrial pressure of 3 mmHg. IAS/Shunts: No atrial level shunt detected by color flow Doppler.  LEFT VENTRICLE PLAX 2D LVIDd:         4.10 cm   Diastology LVIDs:         3.00 cm   LV e' medial:    4.88 cm/s LV PW:         1.30 cm   LV E/e' medial:  15.5 LV IVS:        1.10 cm   LV e' lateral:   9.67 cm/s LVOT diam:     2.40 cm   LV E/e' lateral: 7.8 LV SV:         102 LV SV Index:   43 LVOT Area:     4.52 cm  LEFT ATRIUM           Index        RIGHT ATRIUM           Index LA diam:      4.00 cm 1.67 cm/m   RA Area:     17.10 cm LA Vol (A2C): 77.0 ml 32.22 ml/m  RA Volume:   32.70 ml  13.68 ml/m LA Vol (A4C): 92.1 ml 38.54 ml/m  AORTIC VALVE                     PULMONIC VALVE AV Area (Vmax):    2.95 cm      PR End Diast Vel: 6.97 msec AV Area (Vmean):   2.70 cm AV Area (VTI):     2.99 cm AV Vmax:           210.00 cm/s AV Vmean:          137.500 cm/s AV VTI:            0.340 m AV Peak Grad:      17.6 mmHg AV Mean Grad:      9.0 mmHg LVOT Vmax:         137.00 cm/s LVOT Vmean:        82.000 cm/s LVOT VTI:           0.225 m LVOT/AV VTI ratio: 0.66  AORTA Ao Root diam: 3.80 cm Ao Asc diam:  4.30 cm MITRAL VALVE                TRICUSPID VALVE MV Area (PHT): 2.84 cm     TR Peak grad:   10.5 mmHg MV Decel Time: 267 msec     TR Vmax:        162.00 cm/s MV E velocity: 75.70 cm/s MV A velocity: 107.00 cm/s  SHUNTS MV E/A ratio:  0.71         Systemic VTI:  0.22 m                             Systemic Diam: 2.40 cm Dina Rich MD Electronically signed by Dina Rich MD Signature Date/Time: 06/16/2022/12:06:51 PM    Final  Chlorhexidine Gluconate Cloth  6 each Topical Daily   Gerhardt's butt cream   Topical BID   heparin  5,000 Units Subcutaneous Q12H   metoprolol tartrate  12.5 mg Oral BID    BMET    Component Value Date/Time   NA 141 06/18/2022 0431   K 3.4 (L) 06/18/2022 0431   CL 107 06/18/2022 0431   CO2 24 06/18/2022 0431   GLUCOSE 130 (H) 06/18/2022 0431   BUN 76 (H) 06/18/2022 0431   CREATININE 4.96 (H) 06/18/2022 0431   CREATININE 0.69 06/09/2012 0946   CALCIUM 7.9 (L) 06/18/2022 0431   GFRNONAA 11 (L) 06/18/2022 0431   GFRNONAA >89 06/09/2012 0946   GFRAA >60 08/15/2019 1101   GFRAA >89 06/09/2012 0946   CBC    Component Value Date/Time   WBC 10.4 06/18/2022 0431   RBC 2.74 (L) 06/18/2022 0431   HGB 8.7 (L) 06/18/2022 0431   HCT 27.1 (L) 06/18/2022 0431   PLT 366 06/18/2022 0431   MCV 98.9 06/18/2022 0431   MCH 31.8 06/18/2022 0431   MCHC 32.1 06/18/2022 0431   RDW 14.6 06/18/2022 0431   LYMPHSABS 0.5 (L) 11/20/2018 0753   MONOABS 0.5 11/20/2018 0753   EOSABS 0.0 11/20/2018 0753   BASOSABS 0.0 11/20/2018 0753    A AKI postrenal/obstructive as below. Nonoliguric now S/p b/l PCNs 06/15/22 with IR Significant UOP over the last 48 hours with continued improvement of BUN/Cr to 76/4.96. Hyperkalemia, improved, stable Metabolic acidosis secondary to #1 with mildly increased anion gap; improved Posterior bladder wall thickening/mass with hematuric urine and resultant  hydroureteronephrosis; per urology; suspicious for malignancy CAD Hypertension off home blood pressure medications; blood pressure stable Anasarca/lower extremity edema with concern for secondary cellulitis Question colitis or changes related to treatment of bladder cancer in 2011 on ceftriaxone and metronidazole Mild leukocytosis with no documented fevers   P Anticipate further recovery of GFR but degree of recovery and timing is unclear.  Although has significantly improved over the last 48 hours.  Unclear baseline as no labs for last 2 years. No indications for dialysis We will follow along Urology following for definitive diagnosis.  Pt did voice his wish to have the diagnosis made during this hospitalization.  No mention from Urology regarding cysto and biopsy. Medication Issues; Preferred narcotic agents for pain control are hydromorphone, fentanyl, and methadone. Morphine should not be used.  Baclofen should be avoided Avoid oral sodium phosphate and magnesium citrate based laxatives / bowel preps  Irena Cords, MD [facility]

## 2022-06-19 DIAGNOSIS — J9621 Acute and chronic respiratory failure with hypoxia: Secondary | ICD-10-CM | POA: Diagnosis not present

## 2022-06-19 DIAGNOSIS — R197 Diarrhea, unspecified: Secondary | ICD-10-CM | POA: Diagnosis not present

## 2022-06-19 DIAGNOSIS — N179 Acute kidney failure, unspecified: Secondary | ICD-10-CM | POA: Diagnosis not present

## 2022-06-19 DIAGNOSIS — I10 Essential (primary) hypertension: Secondary | ICD-10-CM | POA: Diagnosis not present

## 2022-06-19 LAB — RENAL FUNCTION PANEL
Albumin: 2.6 g/dL — ABNORMAL LOW (ref 3.5–5.0)
Anion gap: 12 (ref 5–15)
BUN: 65 mg/dL — ABNORMAL HIGH (ref 8–23)
CO2: 23 mmol/L (ref 22–32)
Calcium: 8.1 mg/dL — ABNORMAL LOW (ref 8.9–10.3)
Chloride: 106 mmol/L (ref 98–111)
Creatinine, Ser: 4.08 mg/dL — ABNORMAL HIGH (ref 0.61–1.24)
GFR, Estimated: 14 mL/min — ABNORMAL LOW (ref >60)
Glucose, Bld: 117 mg/dL — ABNORMAL HIGH (ref 70–99)
Phosphorus: 4.1 mg/dL (ref 2.5–4.6)
Potassium: 3.4 mmol/L — ABNORMAL LOW (ref 3.5–5.1)
Sodium: 141 mmol/L (ref 135–145)

## 2022-06-19 LAB — CBC
HCT: 30.7 % — ABNORMAL LOW (ref 39.0–52.0)
Hemoglobin: 9.7 g/dL — ABNORMAL LOW (ref 13.0–17.0)
MCH: 30.9 pg (ref 26.0–34.0)
MCHC: 31.6 g/dL (ref 30.0–36.0)
MCV: 97.8 fL (ref 80.0–100.0)
Platelets: 406 10*3/uL — ABNORMAL HIGH (ref 150–400)
RBC: 3.14 MIL/uL — ABNORMAL LOW (ref 4.22–5.81)
RDW: 14.5 % (ref 11.5–15.5)
WBC: 11.5 10*3/uL — ABNORMAL HIGH (ref 4.0–10.5)
nRBC: 0 % (ref 0.0–0.2)

## 2022-06-19 MED ORDER — CALCIUM CARBONATE ANTACID 500 MG PO CHEW
1.0000 | CHEWABLE_TABLET | Freq: Once | ORAL | Status: AC
  Administered 2022-06-19: 200 mg via ORAL
  Filled 2022-06-19: qty 1

## 2022-06-19 NOTE — Progress Notes (Signed)
PROGRESS NOTE  [name] ZOX:[id number] DOB: 29-Jan-1936 DOA: 07/10/2022 PCP: Verlee Monte, NP  Brief History:  87 year old male with a history of coronary artery disease, hypertension, chronic respiratory failure on 2 L at nighttime, COPD, colorectal adenocarcinoma (09/2009) status post partial colectomy (01/2010)  and chemoradiation with Xeloda.  He finished radiation 12/04/2009.  He had follow-up colonoscopy 12/2013 which was negative.  His CEA tumor markers have been normal.  He states that he has not followed up with oncology in over 5 years at this point. Nevertheless, the patient has been complaining of 2 to 3-week history of abdominal discomfort and distention with difficulty urinating.  He feels like he has incomplete bladder emptying.  He has had some nausea without emesis.  He denies any hematuria or dysuria.  He has had some urinary incontinence.  Regarding his abdominal discomfort, the patient states that it is generalized.  He has had loose stools on a daily basis for the last 2 to 3 weeks.  There is no hematochezia or melena.  He denies any new medications.  He denies any recent travels or exotic foods.  He denies any fevers, chills, chest pain, shortness breath, cough, hemoptysis.  The patient denies any NSAIDs.  In the ED, the patient had low-grade temperature of 99.2 F.  He was hemodynamically stable.  Oxygen saturation was 95% on 2 L.  WBC 15.6, hemoglobin 12.3, platelets 279,000.  LFTs were unremarkable.  Sodium 139, potassium 5.6, bicarbonate 15, serum creatinine 7.04.  UA showed 21-50 WBC. CT of the abdomen pelvis showed LLL consolidation, bilateral hydronephrosis which was already seen on a previous CT chest 06/05/2022, posterior wall bladder thickening with high attenuation material around the Foley catheter, wall thickening @descending  colon at the level of the rectosigmoid.  There is diffuse stranding in the perirectal region surrounding the prostate and bladder.   There was bilateral inguinal and retroperitoneal lymphadenopathy. The patient was admitted for further evaluation and treatment of his acute kidney injury and colitis.  A Foley catheter was inserted in the emergency department. Renal function did not improve, so nephrology and urology were consulted.     Assessment/Plan: AKI -Last serum creatinine 0.80 on 08/15/2019 -secondary to obstructive uropathy -Obtain renal ultrasound--persistent hydronephrosis -Bilateral hydronephrosis seen on CT abdomen -No improvement in serum creatinine after Foley insertion -Nephrology consult appreciated -Accurate I's and O's -Appears hypervolemic>>echo -5/7 Echo--EF 55-60, no WMA, G1DD, normal RVF -06/15/22--bilateral nephrostomy tubes placed -creatinine trending down  -anticipate DC to SNF in next 24 hours   Hyperkalemia - treated and resolved  -Patient was started on bicarbonate drip -Given Lokelma -resolved    Colitis -Descending colon and perirectal thickening as discussed above -GI consulted and now signed off -Stool pathogen panel--neg -Stool for C. Difficile-neg -Continue empiric ceftriaxone and metronidazole x 7 days per GI service with stop date 5/11    Bilateral hydronephrosis/Bladder mass -Foley inserted--did not resolve -due to bladder mass causing obstructive uropathy -Renal ultrasound post Foley insertion--persistent hydronephrosis -06/15/22--bilateral nephrostomy tubes placed -Dr. Ronne Binning following>>will ultimately need cystoscopy -Dr. Ronne Binning planning cystoscopy to be done 1 week after he discharge from SNF   Lobar pneumonia -5/5 CT abd--shows LLL consolidation -continue ceftriaxone with stop date entered 06/20/22    Left leg cellulitis -Appears streptococcal -Venous duplex-neg -Continue ceftriaxone -resolving with antibiotic treatment   Hypomagnesemia -IV replacement ordered -recheck Mg in AM    Essential hypertension -Restart metoprolol at lower dose -Hold HCTZ -Hold  benazepril -Holding amlodipine temporarily -BP remains controlled   Acute on chronic respiratory failure with hypoxia -Chronically on 2 L at nighttime -Secondary to pulmonary edema and pneumonia -Discontinued bicarbonate drip   COPD -Continue bronchodilators   Coronary artery disease -No chest pain presently -Continue metoprolol   Mixed hyperlipidemia -Continue statin   Obesity -BMI 32.10 -lifestyle modification     Family Communication:  family at bedside 5/8, 5/9   Consultants:  renal, urology, GI   Code Status:  FULL   DVT Prophylaxis:  [place] Heparin     Procedures: As Listed in Progress Note Above   Antibiotics: Ceftriaxone 5/5>> Metronidazole 5/5>>   Subjective: No specific complaints. Agreeable to SNF rehab therapy.     Objective: Vitals:   06/18/22 2210 06/19/22 0337 06/19/22 0936 06/19/22 1318  BP: 115/64 117/75 117/72 118/71  Pulse: 68 76 (!) 101 81  Resp: 18 18 18 19   Temp: 98.2 F (36.8 C) 98 F (36.7 C) (!) 97.4 F (36.3 C) 97.9 F (36.6 C)  TempSrc:   Axillary Oral  SpO2: 95% 96% 99% 97%  Weight:      Height:        Intake/Output Summary (Last 24 hours) at 06/19/2022 1447 Last data filed at 06/19/2022 1300 Gross per 24 hour  Intake 1220 ml  Output 2500 ml  Net -1280 ml   Weight change:  Exam:  General:  Pt is alert, follows commands appropriately, not in acute distress HEENT: No icterus, No thrush, No neck mass, [place]/AT Cardiovascular: normal S1/S2, no rubs, no gallops Respiratory: bibasilar rales.  No wheeze Abdomen: bilateral urostomy tubes seen with blood-tinged fluid in both bags.  R>L. Soft/+BS, non tender, non distended, no guarding Extremities: No edema, No lymphangitis, No petechiae, No rashes, no synovitis  Data Reviewed: I have personally reviewed following labs and imaging studies Basic Metabolic Panel: Recent Labs  Lab 06/15/22 0346 06/16/22 0412 06/17/22 0410 06/18/22 0431 06/19/22 0421  NA 139 142 142 141 141   K 4.8 4.4 3.8 3.4* 3.4*  CL 108 108 108 107 106  CO2 16* 20* 21* 24 23  GLUCOSE 134* 101* 109* 130* 117*  BUN 104* 97* 87* 76* 65*  CREATININE 7.04* 6.77* 5.91* 4.96* 4.08*  CALCIUM 8.5* 8.4* 8.2* 7.9* 8.1*  MG  --   --   --  1.5*  --   PHOS  --  7.6* 6.3* 5.5* 4.1   Liver Function Tests: Recent Labs  Lab 06/25/2022 1459 06/15/22 0346 06/16/22 0412 06/17/22 0410 06/18/22 0431 06/19/22 0421  AST 13* 9*  --   --   --   --   ALT 11 9  --   --   --   --   ALKPHOS 63 45  --   --   --   --   BILITOT 0.9 0.6  --   --   --   --   PROT 7.3 6.2*  --   --   --   --   ALBUMIN 3.5 2.8* 2.7* 2.7* 2.6* 2.6*   Recent Labs  Lab 06/17/2022 1459  LIPASE 30   No results for input(s): "AMMONIA" in the last 168 hours. Coagulation Profile: Recent Labs  Lab 06/15/22 1352  INR 1.2   CBC: Recent Labs  Lab 06/15/22 0346 06/16/22 0412 06/17/22 0410 06/18/22 0431 06/19/22 0421  WBC 14.2* 13.1* 11.8* 10.4 11.5*  HGB 9.9* 9.5* 9.4* 8.7* 9.7*  HCT 30.8* 29.2* 29.6* 27.1* 30.7*  MCV 96.6 96.4 96.7  98.9 97.8  PLT 379 383 394 366 406*   Cardiac Enzymes: No results for input(s): "CKTOTAL", "CKMB", "CKMBINDEX", "TROPONINI" in the last 168 hours. BNP: Invalid input(s): "POCBNP" CBG: No results for input(s): "GLUCAP" in the last 168 hours. HbA1C: No results for input(s): "HGBA1C" in the last 72 hours. Urine analysis:    Component Value Date/Time   COLORURINE YELLOW 06/10/2022 1455   APPEARANCEUR CLOUDY (A) 07/02/2022 1455   LABSPEC 1.011 06/25/2022 1455   PHURINE 5.0 06/30/2022 1455   GLUCOSEU NEGATIVE 06/26/2022 1455   HGBUR LARGE (A) 06/18/2022 1455   BILIRUBINUR NEGATIVE 06/27/2022 1455   KETONESUR 5 (A) 06/28/2022 1455   PROTEINUR 100 (A) 06/23/2022 1455   NITRITE NEGATIVE 07/10/2022 1455   LEUKOCYTESUR MODERATE (A)  1455   Recent Results (from the past 240 hour(s))  Blood culture (routine x 2)     Status: None   Collection Time: 06/17/2022  2:59 PM   Specimen: Right  Antecubital; Blood  Result Value Ref Range Status   Specimen Description   Final    RIGHT ANTECUBITAL BOTTLES DRAWN AEROBIC AND ANAEROBIC   Special Requests   Final    Blood Culture results may not be optimal due to an excessive volume of blood received in culture bottles   Culture   Final    NO GROWTH 5 DAYS Performed at [facility], [address] [place], [place] [address]    Report Status 06/19/2022 FINAL  Final  Blood culture (routine x 2)     Status: Abnormal   Collection Time: 06/28/2022  3:12 PM   Specimen: Left Antecubital; Blood  Result Value Ref Range Status   Specimen Description   Final    LEFT ANTECUBITAL BOTTLES DRAWN AEROBIC AND ANAEROBIC Performed at [facility], [address] [place], [place] [address]    Special Requests   Final    Blood Culture results may not be optimal due to an excessive volume of blood received in culture bottles Performed at [facility], [address] [place], [place] [address]    Culture  Setup Time   Final    GRAM POSITIVE COCCI Gram Stain Report Called to,Read Back By and Verified With: M. CATES LAND @ 06/15/22 ANAEROBIC BOTTLE ONLY CRITICAL RESULT CALLED TO, READ BACK BY AND VERIFIED WITH: PHARMD LAURIE POOLE ON 06/15/22 @ 1715 BY DRT    Culture (A)  Final    AEROCOCCUS VIRIDANS Standardized susceptibility testing for this organism is not available. Performed at [facility], [address] [place], [place] [address]    Report Status 06/17/2022 FINAL  Final  Blood Culture ID Panel (Reflexed)     Status: None   Collection Time: 07/05/2022  3:12 PM  Result Value Ref Range Status   Enterococcus faecalis NOT DETECTED NOT DETECTED Final   Enterococcus Faecium NOT DETECTED NOT DETECTED Final   Listeria monocytogenes NOT DETECTED NOT DETECTED Final   Staphylococcus species NOT DETECTED NOT DETECTED Final   Staphylococcus aureus (BCID) NOT DETECTED NOT DETECTED Final   Staphylococcus epidermidis NOT DETECTED NOT DETECTED Final    Staphylococcus lugdunensis NOT DETECTED NOT DETECTED Final   Streptococcus species NOT DETECTED NOT DETECTED Final   Streptococcus agalactiae NOT DETECTED NOT DETECTED Final   Streptococcus pneumoniae NOT DETECTED NOT DETECTED Final   Streptococcus pyogenes NOT DETECTED NOT DETECTED Final   A.calcoaceticus-baumannii NOT DETECTED NOT DETECTED Final   Bacteroides fragilis NOT DETECTED NOT DETECTED Final   Enterobacterales NOT DETECTED NOT DETECTED Final  Enterobacter cloacae complex NOT DETECTED NOT DETECTED Final   Escherichia coli NOT DETECTED NOT DETECTED Final   Klebsiella aerogenes NOT DETECTED NOT DETECTED Final   Klebsiella oxytoca NOT DETECTED NOT DETECTED Final   Klebsiella pneumoniae NOT DETECTED NOT DETECTED Final   Proteus species NOT DETECTED NOT DETECTED Final   Salmonella species NOT DETECTED NOT DETECTED Final   Serratia marcescens NOT DETECTED NOT DETECTED Final   Haemophilus influenzae NOT DETECTED NOT DETECTED Final   Neisseria meningitidis NOT DETECTED NOT DETECTED Final   Pseudomonas aeruginosa NOT DETECTED NOT DETECTED Final   Stenotrophomonas maltophilia NOT DETECTED NOT DETECTED Final   Candida albicans NOT DETECTED NOT DETECTED Final   Candida auris NOT DETECTED NOT DETECTED Final   Candida glabrata NOT DETECTED NOT DETECTED Final   Candida krusei NOT DETECTED NOT DETECTED Final   Candida parapsilosis NOT DETECTED NOT DETECTED Final   Candida tropicalis NOT DETECTED NOT DETECTED Final   Cryptococcus neoformans/gattii NOT DETECTED NOT DETECTED Final    Comment: Performed at [facility], [address] [place], [place] [address]  Urine Culture     Status: Abnormal   Collection Time: 07/03/2022  4:05 PM   Specimen: Urine, Clean Catch  Result Value Ref Range Status   Specimen Description   Final    URINE, CLEAN CATCH Performed at [facility], [address], [place], [place] [address]    Special Requests   Final    NONE Performed at [facility],  [address], [place], [place] [address]    Culture MULTIPLE SPECIES PRESENT, SUGGEST RECOLLECTION (A)  Final   Report Status 06/15/2022 FINAL  Final  Gastrointestinal Panel by PCR , Stool     Status: None   Collection Time: 06/15/22  2:14 PM   Specimen: STOOL  Result Value Ref Range Status   Campylobacter species NOT DETECTED NOT DETECTED Final   Plesimonas shigelloides NOT DETECTED NOT DETECTED Final   Salmonella species NOT DETECTED NOT DETECTED Final   Yersinia enterocolitica NOT DETECTED NOT DETECTED Final   Vibrio species NOT DETECTED NOT DETECTED Final   Vibrio cholerae NOT DETECTED NOT DETECTED Final   Enteroaggregative E coli (EAEC) NOT DETECTED NOT DETECTED Final   Enteropathogenic E coli (EPEC) NOT DETECTED NOT DETECTED Final   Enterotoxigenic E coli (ETEC) NOT DETECTED NOT DETECTED Final   Shiga like toxin producing E coli (STEC) NOT DETECTED NOT DETECTED Final   Shigella/Enteroinvasive E coli (EIEC) NOT DETECTED NOT DETECTED Final   Cryptosporidium NOT DETECTED NOT DETECTED Final   Cyclospora cayetanensis NOT DETECTED NOT DETECTED Final   Entamoeba histolytica NOT DETECTED NOT DETECTED Final   Giardia lamblia NOT DETECTED NOT DETECTED Final   Adenovirus F40/41 NOT DETECTED NOT DETECTED Final   Astrovirus NOT DETECTED NOT DETECTED Final   Norovirus GI/GII NOT DETECTED NOT DETECTED Final   Rotavirus A NOT DETECTED NOT DETECTED Final   Sapovirus (I, II, IV, and V) NOT DETECTED NOT DETECTED Final    Comment: Performed at [facility], [address] [place], [place] [address]  C Difficile Quick Screen w PCR reflex     Status: None   Collection Time: 06/15/22  2:14 PM   Specimen: STOOL  Result Value Ref Range Status   C Diff antigen NEGATIVE NEGATIVE Final   C Diff toxin NEGATIVE NEGATIVE Final   C Diff interpretation No C. difficile detected.  Final    Comment: Performed at [facility], [address] [place], [place] [address]  Scheduled Meds:  Chlorhexidine  Gluconate Cloth  6 each Topical Daily   Gerhardt's butt cream   Topical BID   heparin  5,000 Units Subcutaneous Q12H   metoprolol tartrate  12.5 mg Oral BID   Continuous Infusions:  cefTRIAXone (ROCEPHIN)  IV 1 g (06/19/22 1419)   metronidazole 500 mg (06/19/22 0931)    Procedures/Studies: ECHOCARDIOGRAM COMPLETE  Result Date: 06/16/2022    ECHOCARDIOGRAM REPORT   Patient Name:   BELVIN NEELS Date of Exam: 06/16/2022 Medical Rec #:  [id number]     Height:       74.0 in Accession #:    [id number]    Weight:       250.0 lb Date of Birth:  08-Sep-1935      BSA:          2.390 m Patient Age:    87 years      BP:           144/75 mmHg Patient Gender: M             HR:           94 bpm. [facility] Procedure: 2D Echo, Cardiac Doppler, Color Doppler and Intracardiac            Opacification Agent Indications:    CHF-Acute Diastolic I50.31  History:        Patient has prior history of Echocardiogram examinations, most                 recent 11/19/2018. CAD, Signs/Symptoms:Shortness of Breath; Risk                 Factors:Hypertension, Dyslipidemia and Former Smoker.  Sonographer:    Aron Baba Referring Phys: [id number] DAVID TAT  Sonographer Comments: Technically difficult study due to poor echo windows. Image acquisition challenging due to COPD. IMPRESSIONS  1. Left ventricular ejection fraction, by estimation, is 55 to 60%. The left ventricle has normal function. The left ventricle has no regional wall motion abnormalities. There is mild left ventricular hypertrophy. Left ventricular diastolic parameters are consistent with Grade I diastolic dysfunction (impaired relaxation).  2. RV not well visualized. Grossly appears normal in size and function. . Right ventricular systolic function was not well visualized. The right ventricular size is not well visualized. There is normal pulmonary artery systolic pressure.  3. Left atrial size was mildly dilated.  4. The mitral valve is normal in structure. No  evidence of mitral valve regurgitation. No evidence of mitral stenosis.  5. The aortic valve is tricuspid. There is moderate calcification of the aortic valve. There is moderate thickening of the aortic valve. Aortic valve regurgitation is not visualized. No aortic stenosis is present.  6. There is mild dilatation of the ascending aorta, measuring 43 mm.  7. The inferior vena cava is normal in size with greater than 50% respiratory variability, suggesting right atrial pressure of 3 mmHg. FINDINGS  Left Ventricle: Left ventricular ejection fraction, by estimation, is 55 to 60%. The left ventricle has normal function. The left ventricle has no regional wall motion abnormalities. Definity contrast agent was given IV to delineate the left ventricular  endocardial borders. The left ventricular internal cavity size was normal in size. There is mild left ventricular hypertrophy. Left ventricular diastolic parameters are consistent with Grade I diastolic dysfunction (impaired relaxation). Normal left ventricular filling pressure. Right Ventricle: RV not well visualized. Grossly appears normal in size and function. The right ventricular size is not  well visualized. Right vetricular wall thickness was not well visualized. Right ventricular systolic function was not well visualized.  There is normal pulmonary artery systolic pressure. The tricuspid regurgitant velocity is 1.62 m/s, and with an assumed right atrial pressure of 3 mmHg, the estimated right ventricular systolic pressure is 13.5 mmHg. Left Atrium: Left atrial size was mildly dilated. Right Atrium: Right atrial size was normal in size. Pericardium: There is no evidence of pericardial effusion. Mitral Valve: The mitral valve is normal in structure. There is mild thickening of the mitral valve leaflet(s). There is mild calcification of the mitral valve leaflet(s). Mild mitral annular calcification. No evidence of mitral valve regurgitation. No evidence of mitral valve  stenosis. Tricuspid Valve: The tricuspid valve is normal in structure. Tricuspid valve regurgitation is not demonstrated. No evidence of tricuspid stenosis. Aortic Valve: The aortic valve is tricuspid. There is moderate calcification of the aortic valve. There is moderate thickening of the aortic valve. There is moderate aortic valve annular calcification. Aortic valve regurgitation is not visualized. No aortic stenosis is present. Aortic valve mean gradient measures 9.0 mmHg. Aortic valve peak gradient measures 17.6 mmHg. Aortic valve area, by VTI measures 2.99 cm. Pulmonic Valve: The pulmonic valve was not well visualized. Pulmonic valve regurgitation is not visualized. No evidence of pulmonic stenosis. Aorta: The aortic root is normal in size and structure. There is mild dilatation of the ascending aorta, measuring 43 mm. Venous: The inferior vena cava is normal in size with greater than 50% respiratory variability, suggesting right atrial pressure of 3 mmHg. IAS/Shunts: No atrial level shunt detected by color flow Doppler.  LEFT VENTRICLE PLAX 2D LVIDd:         4.10 cm   Diastology LVIDs:         3.00 cm   LV e' medial:    4.88 cm/s LV PW:         1.30 cm   LV E/e' medial:  15.5 LV IVS:        1.10 cm   LV e' lateral:   9.67 cm/s LVOT diam:     2.40 cm   LV E/e' lateral: 7.8 LV SV:         102 LV SV Index:   43 LVOT Area:     4.52 cm  LEFT ATRIUM           Index        RIGHT ATRIUM           Index LA diam:      4.00 cm 1.67 cm/m   RA Area:     17.10 cm LA Vol (A2C): 77.0 ml 32.22 ml/m  RA Volume:   32.70 ml  13.68 ml/m LA Vol (A4C): 92.1 ml 38.54 ml/m  AORTIC VALVE                     PULMONIC VALVE AV Area (Vmax):    2.95 cm      PR End Diast Vel: 6.97 msec AV Area (Vmean):   2.70 cm AV Area (VTI):     2.99 cm AV Vmax:           210.00 cm/s AV Vmean:          137.500 cm/s AV VTI:            0.340 m AV Peak Grad:      17.6 mmHg AV Mean Grad:      9.0 mmHg LVOT Vmax:  137.00 cm/s LVOT Vmean:         82.000 cm/s LVOT VTI:          0.225 m LVOT/AV VTI ratio: 0.66  AORTA Ao Root diam: 3.80 cm Ao Asc diam:  4.30 cm MITRAL VALVE                TRICUSPID VALVE MV Area (PHT): 2.84 cm     TR Peak grad:   10.5 mmHg MV Decel Time: 267 msec     TR Vmax:        162.00 cm/s MV E velocity: 75.70 cm/s MV A velocity: 107.00 cm/s  SHUNTS MV E/A ratio:  0.71         Systemic VTI:  0.22 m                             Systemic Diam: 2.40 cm Dina Rich MD Electronically signed by Dina Rich MD Signature Date/Time: 06/16/2022/12:06:51 PM    Final    IR NEPHROSTOMY PLACEMENT LEFT  Result Date: 06/15/2022 INDICATION: 87 year old male with bilateral hydronephrosis and acute renal failure. He presents for bilateral percutaneous nephrostomy tube placement. EXAM: IR NEPHROSTOMY PLACEMENT LEFT; IR NEPHROSTOMY PLACEMENT RIGHT COMPARISON:  None Available. MEDICATIONS: Rocephin 2 g IV; The antibiotic was administered in an appropriate time frame prior to skin puncture. ANESTHESIA/SEDATION: Fentanyl 100 mcg IV; Versed 2 mg IV Moderate Sedation Time:  25 minutes The patient's vital signs and level of consciousness were continuously monitored during the procedure by the interventional radiology nurse under my direct supervision. CONTRAST:  20mL OMNIPAQUE IOHEXOL 300 MG/ML SOLN - administered into the collecting system(s) FLUOROSCOPY: Fluoroscopy Time:  minutes  seconds ( mGy). COMPLICATIONS: None immediate. TECHNIQUE: The procedure, risks, benefits, and alternatives were explained to the patient. Questions regarding the procedure were encouraged and answered. The patient understands and consents to the procedure. LEFT The left flank was prepped with chlorhexidine in a sterile fashion, and a sterile drape was applied covering the operative field. A sterile gown and sterile gloves were used for the procedure. Local anesthesia was provided with 1% Lidocaine. The left flank was interrogated with ultrasound and the left kidney  identified. The kidney is hydronephrotic. A suitable access site on the skin overlying the lower pole, posterior calix was identified. After local mg anesthesia was achieved, a small skin nick was made with an 11 blade scalpel. A 21 gauge Accustick needle was then advanced under direct sonographic guidance into the lower pole of the left kidney. A 0.018 inch wire was advanced under fluoroscopic guidance into the left renal collecting system. The Accustick sheath was then advanced over the wire and a 0.018 system exchanged for a 0.035 system. Gentle hand injection of contrast material confirms placement of the sheath within the renal collecting system. There is severe hydronephrosis. The tract from the scan into the renal collecting system was then dilated serially to 10-French. A 10-French Cook all-purpose drain was then placed and positioned under fluoroscopic guidance. The locking loop is well formed within the left renal pelvis. The catheter was secured to the skin with 2-0 Prolene and a sterile bandage was placed. Catheter was left to gravity bag drainage. RIGHT The right flank was prepped with chlorhexidine in a sterile fashion, and a sterile drape was applied covering the operative field. A sterile gown and sterile gloves were used for the procedure. Local anesthesia was provided with 1% Lidocaine. The right flank was interrogated  with ultrasound and the left kidney identified. The kidney is hydronephrotic. There are multiple large cysts overlying the posterior calices. A suitable access site on the skin overlying the lower pole, posterior calix was identified. After local anesthesia was achieved, a small skin nick was made with an 11 blade scalpel. A 21 gauge Accustick needle was then advanced under direct sonographic guidance into the cyst. Aspiration was performed. The cyst was decompressed. Next, the Accustick needle was advanced into the calyx within the lower pole of the right kidney. A 0.018 inch wire  was advanced under fluoroscopic guidance into the left renal collecting system. The Accustick sheath was then advanced over the wire and a 0.018 system exchanged for a 0.035 system. Gentle hand injection of contrast material confirms placement of the sheath within the renal collecting system. There is severe hydronephrosis. The tract from the scan into the renal collecting system was then dilated serially to 10-French. A 10-French Cook all-purpose drain was then placed and positioned under fluoroscopic guidance. The locking loop is well formed within the left renal pelvis. The catheter was secured to the skin with 2-0 Prolene and a sterile bandage was placed. Catheter was left to gravity bag drainage. IMPRESSION: Successful placement of a bilateral 10 French percutaneous nephrostomy tubes. Electronically Signed   By: Malachy Moan M.D.   On: 06/15/2022 17:34   IR NEPHROSTOMY PLACEMENT RIGHT  Result Date: 06/15/2022 INDICATION: 87 year old male with bilateral hydronephrosis and acute renal failure. He presents for bilateral percutaneous nephrostomy tube placement. EXAM: IR NEPHROSTOMY PLACEMENT LEFT; IR NEPHROSTOMY PLACEMENT RIGHT COMPARISON:  None Available. MEDICATIONS: Rocephin 2 g IV; The antibiotic was administered in an appropriate time frame prior to skin puncture. ANESTHESIA/SEDATION: Fentanyl 100 mcg IV; Versed 2 mg IV Moderate Sedation Time:  25 minutes The patient's vital signs and level of consciousness were continuously monitored during the procedure by the interventional radiology nurse under my direct supervision. CONTRAST:  20mL OMNIPAQUE IOHEXOL 300 MG/ML SOLN - administered into the collecting system(s) FLUOROSCOPY: Fluoroscopy Time:  minutes  seconds ( mGy). COMPLICATIONS: None immediate. TECHNIQUE: The procedure, risks, benefits, and alternatives were explained to the patient. Questions regarding the procedure were encouraged and answered. The patient understands and consents to the  procedure. LEFT The left flank was prepped with chlorhexidine in a sterile fashion, and a sterile drape was applied covering the operative field. A sterile gown and sterile gloves were used for the procedure. Local anesthesia was provided with 1% Lidocaine. The left flank was interrogated with ultrasound and the left kidney identified. The kidney is hydronephrotic. A suitable access site on the skin overlying the lower pole, posterior calix was identified. After local mg anesthesia was achieved, a small skin nick was made with an 11 blade scalpel. A 21 gauge Accustick needle was then advanced under direct sonographic guidance into the lower pole of the left kidney. A 0.018 inch wire was advanced under fluoroscopic guidance into the left renal collecting system. The Accustick sheath was then advanced over the wire and a 0.018 system exchanged for a 0.035 system. Gentle hand injection of contrast material confirms placement of the sheath within the renal collecting system. There is severe hydronephrosis. The tract from the scan into the renal collecting system was then dilated serially to 10-French. A 10-French Cook all-purpose drain was then placed and positioned under fluoroscopic guidance. The locking loop is well formed within the left renal pelvis. The catheter was secured to the skin with 2-0 Prolene and a sterile bandage was  placed. Catheter was left to gravity bag drainage. RIGHT The right flank was prepped with chlorhexidine in a sterile fashion, and a sterile drape was applied covering the operative field. A sterile gown and sterile gloves were used for the procedure. Local anesthesia was provided with 1% Lidocaine. The right flank was interrogated with ultrasound and the left kidney identified. The kidney is hydronephrotic. There are multiple large cysts overlying the posterior calices. A suitable access site on the skin overlying the lower pole, posterior calix was identified. After local anesthesia was  achieved, a small skin nick was made with an 11 blade scalpel. A 21 gauge Accustick needle was then advanced under direct sonographic guidance into the cyst. Aspiration was performed. The cyst was decompressed. Next, the Accustick needle was advanced into the calyx within the lower pole of the right kidney. A 0.018 inch wire was advanced under fluoroscopic guidance into the left renal collecting system. The Accustick sheath was then advanced over the wire and a 0.018 system exchanged for a 0.035 system. Gentle hand injection of contrast material confirms placement of the sheath within the renal collecting system. There is severe hydronephrosis. The tract from the scan into the renal collecting system was then dilated serially to 10-French. A 10-French Cook all-purpose drain was then placed and positioned under fluoroscopic guidance. The locking loop is well formed within the left renal pelvis. The catheter was secured to the skin with 2-0 Prolene and a sterile bandage was placed. Catheter was left to gravity bag drainage. IMPRESSION: Successful placement of a bilateral 10 French percutaneous nephrostomy tubes. Electronically Signed   By: Malachy Moan M.D.   On: 06/15/2022 17:34   US RENAL  Result Date: 06/15/2022 CLINICAL DATA:  [id number] AKI (acute kidney injury) (HCC) [id number] EXAM: RENAL / URINARY TRACT ULTRASOUND COMPLETE COMPARISON:  CT 07/08/2022 FINDINGS: Right Kidney: Renal measurements: 13.1 x 7.0 x 7.6 cm = volume: 363.7 mL. Persistent moderate hydronephrosis. Left Kidney: Renal measurements: 11.9 x 7.4 x 5.6 cm = volume: 260.1 mL. Persistent moderate hydronephrosis. Bladder: Foley catheter is in place with heterogeneous hyperechoic material adjacent to the Foley balloon, which is avascular. Posterior bladder wall thickening. Other: None. IMPRESSION: Persistent moderate hydronephrosis, similar to recent CT. Intraluminal echogenic material in the bladder which could represent blood clot, correlate with  urinalysis. Posterior bladder wall thickening as seen on recent CT. Electronically Signed   By: Caprice Renshaw M.D.   On: 06/15/2022 12:38   US Venous Img Lower Bilateral (DVT)  Result Date: 06/15/2022 CLINICAL DATA:  Bilateral lower extremity pain and edema, left-greater-than-right. Former smoker. Evaluate for DVT. EXAM: BILATERAL LOWER EXTREMITY VENOUS DOPPLER ULTRASOUND TECHNIQUE: Gray-scale sonography with graded compression, as well as color Doppler and duplex ultrasound were performed to evaluate the lower extremity deep venous systems from the level of the common femoral vein and including the common femoral, femoral, profunda femoral, popliteal and calf veins including the posterior tibial, peroneal and gastrocnemius veins when visible. The superficial great saphenous vein was also interrogated. Spectral Doppler was utilized to evaluate flow at rest and with distal augmentation maneuvers in the common femoral, femoral and popliteal veins. COMPARISON:  CT abdomen and pelvis-06/27/2022 FINDINGS: Examination is degraded due to patient body habitus and poor sonographic window RIGHT LOWER EXTREMITY Common Femoral Vein: No evidence of thrombus. Normal compressibility, respiratory phasicity and response to augmentation. Saphenofemoral Junction: No evidence of thrombus. Normal compressibility and flow on color Doppler imaging. Profunda Femoral Vein: No evidence of thrombus. Normal compressibility and flow on  color Doppler imaging. Femoral Vein: No evidence of thrombus. Normal compressibility, respiratory phasicity and response to augmentation. Popliteal Vein: No evidence of thrombus. Normal compressibility, respiratory phasicity and response to augmentation. Calf Veins: No evidence of thrombus. Normal compressibility and flow on color Doppler imaging. Superficial Great Saphenous Vein: No evidence of thrombus. Normal compressibility. Other Findings:  None. LEFT LOWER EXTREMITY Common Femoral Vein: No evidence of  thrombus. Normal compressibility, respiratory phasicity and response to augmentation. Saphenofemoral Junction: No evidence of thrombus. Normal compressibility and flow on color Doppler imaging. Profunda Femoral Vein: No evidence of thrombus. Normal compressibility and flow on color Doppler imaging. Femoral Vein: No evidence of thrombus. Normal compressibility, respiratory phasicity and response to augmentation. Popliteal Vein: No evidence of thrombus. Normal compressibility, respiratory phasicity and response to augmentation. Calf Veins: No evidence of thrombus. Normal compressibility and flow on color Doppler imaging. Superficial Great Saphenous Vein: No evidence of thrombus. Normal compressibility. Other Findings: Pathologically enlarged left inguinal lymph nodes as demonstrated on preceding abdominal CT with index inguinal lymph node measuring 1.3 cm in greatest short axis diameter (image 49). There is a moderate amount of subcutaneous edema at the level of the left lower leg and calf. IMPRESSION: 1. No evidence of DVT within the left lower extremity. 2. Pathologically enlarged left inguinal lymph nodes, similar to preceding abdominal CT performed 06/19/2022. Electronically Signed   By: Simonne Come M.D.   On: 06/15/2022 10:59   DG Chest Port 1V same Day  Result Date: 06/15/2022 CLINICAL DATA:  Dyspnea EXAM: PORTABLE CHEST 1 VIEW COMPARISON:  08/15/2019, CT 06/05/2022 FINDINGS: Stable coarsening of the pulmonary interstitium. The lungs are symmetrically well inflated. Known spiculated pulmonary nodule within the peripheral right upper lobe seen on prior CT examination is not well appreciated on this exam. New retrocardiac opacity in keeping with a focal pulmonary infiltrate within this region, possibly infectious in the acute setting. No pneumothorax or pleural effusion. Cardiac size within normal limits. Central pulmonary vascular congestion without overt pulmonary edema. IMPRESSION: 1. New retrocardiac  pulmonary infiltrate, possibly infectious in the acute setting. 2. Central pulmonary vascular congestion without overt pulmonary edema. 3. Known right upper lobe spiculated pulmonary nodule not well appreciated on this exam. Electronically Signed   By: Helyn Numbers M.D.   On: 06/15/2022 00:48   CT ABDOMEN PELVIS WO CONTRAST  Result Date: 06/16/2022 CLINICAL DATA:  Abdominal pain, nausea and diarrhea, history of colon cancer EXAM: CT ABDOMEN AND PELVIS WITHOUT CONTRAST TECHNIQUE: Multidetector CT imaging of the abdomen and pelvis was performed following the standard protocol without IV contrast. Unenhanced CT was performed per clinician order. Lack of IV contrast limits sensitivity and specificity, especially for evaluation of abdominal/pelvic solid viscera. RADIATION DOSE REDUCTION: This exam was performed according to the departmental dose-optimization program which includes automated exposure control, adjustment of the mA and/or kV according to patient size and/or use of iterative reconstruction technique. COMPARISON:  05/16/2022 FINDINGS: Lower chest: New consolidation within the posterior left lower lobe at the costophrenic angle, which could be atelectasis or acute airspace disease. No pleural effusion. Hepatobiliary: Unremarkable unenhanced appearance of the liver. The gallbladder is moderately distended, without evidence of cholelithiasis or cholecystitis. Pancreas: Unremarkable unenhanced appearance. Spleen: Unremarkable unenhanced appearance. Adrenals/Urinary Tract: Bilateral hydronephrosis is again identified, as seen on recent chest CT. There is also bilateral hydroureter to the level of the base of the bladder. There is marked posterior bladder wall thickening, measuring up to 14 mm. High attenuation material seen within the bladder surrounding the  Foley catheter balloon consistent with blood products. Differential diagnosis include cystitis, chronic scarring from previous radiation therapy in a  patient with a history of colon cancer, or bladder neoplasm. Urology consultation recommended. There are bilateral nonobstructing renal calculi, measuring 5 mm on the left and 3 mm on the right. Simple appearing right renal cysts do not require specific imaging follow-up. The adrenals are unremarkable. Stomach/Bowel: Postsurgical changes are seen from distal colectomy and reanastomosis. There is segmental wall thickening of the descending colon extending to the level of the anastomosis, compatible with inflammatory or infectious colitis. Moderate distension of the colon with retained gas and stool. No evidence of small-bowel obstruction. Vascular/Lymphatic: Lymphadenopathy has developed within the bilateral inguinal regions and retroperitoneum. Index lymph node in the left inguinal region reference image 76/2 measures 15 mm in short axis. Left para-aortic adenopathy measures up to 14 mm in short axis reference image 43/2. There is extensive atherosclerosis throughout the aorta and its distal branches. Evaluation of the vascular lumen is limited without IV contrast. Reproductive: The prostate is enlarged, measuring 5.7 x 4.8 cm in cross-sectional diameter. Other: There is diffuse fat stranding in the perirectal region, surrounding the prostate, and adjacent to the wall thickening at the base of the bladder. This is nonspecific, and could be related to prior radiation therapy. There is no free intraperitoneal fluid or free intraperitoneal gas. There are fat containing midline ventral hernias. The periumbilical fat containing ventral hernia on image 53 demonstrates stranding within the herniated fat, could reflect incarceration. No bowel herniation. There is also evidence of prior ventral hernia repair. Musculoskeletal: No acute or destructive bony lesions. Stable severe right hip osteoarthritis. Reconstructed images demonstrate no additional findings. IMPRESSION: 1. Marked wall thickening of the posterior aspect of  the bladder, measuring up to 14 mm. This results in severe bilateral hydroureteronephrosis. Differential diagnosis includes neoplasm, post radiation change, or cystitis. High attenuation material surrounding the Foley catheter within the bladder lumen consistent with blood products. Urology consultation and cystoscopy recommended. 2. Segmental wall thickening of the distal colon, extending from the splenic flexure through the rectosigmoid anastomosis, suspicious for underlying inflammatory or infectious colitis. 3. New retroperitoneal and bilateral inguinal lymphadenopathy. Metastatic disease not excluded. 4. New dependent consolidation within the left lower lobe posterior costophrenic angle, consistent with pneumonia or atelectasis. 5. Fat containing midline ventral hernias. The inferior ventral hernia demonstrates stranding within the herniated fat, incarcerated hernia not excluded. No bowel herniation. 6. Enlarged prostate. 7. Nonspecific fat stranding within the lower pelvis surrounding the rectosigmoid anastomosis, prostate, and bladder base. This could be sequela of prior radiation therapy. 8.  Aortic Atherosclerosis (ICD10-I70.0). 9. Bilateral nonobstructing renal calculi. Electronically Signed   By: Sharlet Salina M.D.   On: 06/13/2022 17:06   CT Chest Wo Contrast  Result Date: 06/09/2022 CLINICAL DATA:  History of colon cancer. Status post chemotherapy and radiation. Lung nodules. EXAM: CT CHEST WITHOUT CONTRAST TECHNIQUE: Multidetector CT imaging of the chest was performed following the standard protocol without IV contrast. RADIATION DOSE REDUCTION: This exam was performed according to the departmental dose-optimization program which includes automated exposure control, adjustment of the mA and/or kV according to patient size and/or use of iterative reconstruction technique. COMPARISON:  CT 09/26/2021 and older FINDINGS: Cardiovascular: Heart is nonenlarged. No pericardial effusion. Coronary artery  calcifications are seen. The thoracic aorta has some scattered vascular calcifications. Tortuous course of the descending thoracic aorta. Mediastinum/Nodes: Small hiatal hernia. Normal caliber thoracic esophagus. The esophagus is slightly patulous. Heterogeneous thyroid gland. No  specific abnormal lymph node enlargement identified in the axillary region or hilum on this noncontrast examination. There are some small mediastinal nodes, nonpathologic by size criteria. This includes retrocrural. Example series 2, image 132 right retrocrural measures 12 x 9 mm today and previously 12 x 8 mm. Lungs/Pleura: Breathing motion. Dependent atelectasis. No consolidation, pneumothorax or effusion. Upper lung zone centrilobular emphysematous lung changes are identified. Apical pleural thickening on the left-greater-than-right. Spiculated semi-solid nodule seen in the right upper lobe on the prior that measured 16 mm overall, today on series 2, image 62 again measures 16 by 10 mm, unchanged when measured in the same fashion. The solid component more superior the was measured at 9 mm is stable. On the study of December 2021, the lesion is remeasured in the same fashion as today and at that time would have measured 15 x 9 mm. Very similar. Left upper lobe 5 mm nodule at the apex is stable today on series 2, image 32. This lesion has been stable since December 2021. Upper Abdomen: In the upper abdomen the adrenal glands are diffusely thickened and nodular. New collecting system dilatation of the kidneys. Etiology is uncertain. Musculoskeletal: Diffuse degenerative changes seen along the spine. IMPRESSION: Bilateral lung nodule seen previously are similar today going back to a study of December 2021. Long-term stability. No specific additional imaging follow-up of the lung nodules. Underlying emphysematous lung changes. However there is new collecting system dilatation of the kidneys at the edge of the imaging field of uncertain  etiology. Recommend dedicated abdominal imaging to confirm etiology and correlate with known history. Findings will be called to the ordering service by the Radiology physician assistant team Aortic Atherosclerosis (ICD10-I70.0) and Emphysema (ICD10-J43.9). Electronically Signed   By: Karen Kays M.D.   On: 06/09/2022 15:15    Standley Dakins, MD   [facility]  If 7PM-7AM, please contact night-coverage [email or web address] Password [email or web address] 06/19/2022, 2:47 PM   LOS: 5 days

## 2022-06-19 NOTE — Progress Notes (Signed)
Admit: 06/11/2022 LOS: 5  46M AKI and hyperkalemia 2/2 urinary obstruction in the posterior bladder causing severe hydroureteronephrosis.  Very concerning for bladder cancer.   Subjective:  No new issues 3.9 L UOP via bilateral percutaneous nephrostomy tubes Serum creatinine further improved to 4.1 Edema is improving Afebrile, vital signs stable  05/09 0701 - 05/10 0700 In: 1340 [P.O.:840; IV Piggyback:500] Out: 3975 [Urine:3975]  Filed Weights   06/15/2022 1425  Weight: 113.4 kg    Scheduled Meds:  Chlorhexidine Gluconate Cloth  6 each Topical Daily   Gerhardt's butt cream   Topical BID   heparin  5,000 Units Subcutaneous Q12H   metoprolol tartrate  12.5 mg Oral BID   Continuous Infusions:  cefTRIAXone (ROCEPHIN)  IV 1 g (06/18/22 1504)   metronidazole 500 mg (06/19/22 0931)   PRN Meds:.acetaminophen **OR** acetaminophen, ipratropium-albuterol, ondansetron **OR** ondansetron (ZOFRAN) IV, mouth rinse  Current Labs: reviewed    Physical Exam:  Blood pressure 117/72, pulse (!) 101, temperature (!) 97.4 F (36.3 C), temperature source Axillary, resp. rate 18, height 6\' 2"  (1.88 m), weight 113.4 kg, SpO2 99 %. GEN: NAD, in bed ENT: Hard of hearing EYES: EOMI CV: Regular, normal S1 and S2 PULM: Diminished in the bases, normal work of breathing ABD: Distended, mildly tender throughout, quiescent bowel sounds SKIN: Left lower extremity with some erythema and streaking. EXT: 2+ edema bilaterally in the legs  A AKI postrenal/obstructive as below. Nonoliguric now S/p b/l PCNs 06/15/22 with IR Having continuous improvement in SCr / BUN This AM Only time will tell where he settles out at, Hyperkalemia, improved, stable Metabolic acidosis secondary to #1 with mildly increased anion gap; improved Posterior bladder wall thickening/mass with hematuric urine and resultant hydroureteronephrosis; per urology; suspicious for malignancy; for outpatient cystoscopy CAD Hypertension off  home blood pressure medications; blood pressure stable Anasarca/lower extremity edema with concern for secondary cellulitis Question colitis or changes related to treatment of bladder cancer in 2011 on ceftriaxone and metronidazole Mild leukocytosis with no documented fevers  P We will follow along Urology following for definitive diagnosis; outpatient management from here Medication Issues; Preferred narcotic agents for pain control are hydromorphone, fentanyl, and methadone. Morphine should not be used.  Baclofen should be avoided Avoid oral sodium phosphate and magnesium citrate based laxatives / bowel preps   Patient will not be seen over the weekend.  Please call for any questions or concerns.  Sabra Heck MD 06/19/2022, 11:03 AM  Recent Labs  Lab 06/17/22 0410 06/18/22 0431 06/19/22 0421  NA 142 141 141  K 3.8 3.4* 3.4*  CL 108 107 106  CO2 21* 24 23  GLUCOSE 109* 130* 117*  BUN 87* 76* 65*  CREATININE 5.91* 4.96* 4.08*  CALCIUM 8.2* 7.9* 8.1*  PHOS 6.3* 5.5* 4.1    Recent Labs  Lab 06/17/22 0410 06/18/22 0431 06/19/22 0421  WBC 11.8* 10.4 11.5*  HGB 9.4* 8.7* 9.7*  HCT 29.6* 27.1* 30.7*  MCV 96.7 98.9 97.8  PLT 394 366 406*

## 2022-06-19 NOTE — Care Management Important Message (Signed)
Important Message  Patient Details  Name: [name] MRN: [id number] Date of Birth: 04/04/1935   Medicare Important Message Given:  Yes     [name] 06/19/2022, 9:46 AM

## 2022-06-19 NOTE — Progress Notes (Signed)
Physical Therapy Treatment Patient Details Name: [name] MRN: [id number] DOB: 1935-08-03 Today's Date: 06/19/2022   History of Present Illness [name]  is a 87 y.o. male, with history of CAD s/p stent placement, hypertension came to hospital with symptoms of incomplete bladder emptying, urinary incontinence, nausea, no vomiting, loose stools for past 3 to 4 days.  Patient says that he was referred to nephrologist by his PCP few days ago and saw the nephrologist, who had recommended that he should get renal ultrasound which was scheduled for May 14.  Patient says that for past few days he noticed that he was having incontinence of urine and also having loose stools, having difficulty controlling his bowel movements.  Patient felt very weak for past few days, also developed nausea but no vomiting.  He also noticed worsening left lower extremity swelling and redness.    PT Comments    Patient agreeable for therapy and required Mod assistance for donning his gloves to assist with gripping.  Patient demonstrates slow labored movement for sitting up at bedside with c/o dizziness and mild nausea upon sitting.  Patient demonstrated fair/good return for completing BLE ROM/strengthening exercises, required Mod assist for completing sit to stand, but poor tolerance for standing with due to c/o increasing dizziness with BP at 95/55 - nurse notified.  Patient demonstrated fair return for using BUE and RLE to assist with repositioning self when put back to bed with bed in head down position.  Patient will benefit from continued skilled physical therapy in hospital and recommended venue below to increase strength, balance, endurance for safe ADLs and gait.      Recommendations for follow up therapy are one component of a multi-disciplinary discharge planning process, led by the attending physician.  Recommendations may be updated based on patient status, additional functional criteria and insurance  authorization.  Follow Up Recommendations  Can patient physically be transported by private vehicle: No    Assistance Recommended at Discharge Set up Supervision/Assistance  Patient can return home with the following A lot of help with bathing/dressing/bathroom;A lot of help with walking and/or transfers;Help with stairs or ramp for entrance;Assistance with cooking/housework   Equipment Recommendations  None recommended by PT    Recommendations for Other Services       Precautions / Restrictions Precautions Precautions: Fall Restrictions Weight Bearing Restrictions: No     Mobility  Bed Mobility Overal bed mobility: Needs Assistance Bed Mobility: Supine to Sit, Sit to Supine     Supine to sit: Mod assist, Max assist Sit to supine: Mod assist   General bed mobility comments: increased time, labored movement    Transfers Overall transfer level: Needs assistance Equipment used: Rolling walker (2 wheels) Transfers: Sit to/from Stand Sit to Stand: Mod assist           General transfer comment: slow labored movement, poor tolerance for standing due to BP low at 95/55 with c/o increased dizziness    Ambulation/Gait                   Stairs             Wheelchair Mobility    Modified Rankin (Stroke Patients Only)       Balance Overall balance assessment: Needs assistance Sitting-balance support: Feet supported, No upper extremity supported Sitting balance-Leahy Scale: Fair Sitting balance - Comments: fair/good seated at EOB   Standing balance support: During functional activity, Reliant on assistive device for balance, Bilateral upper extremity supported  Standing balance-Leahy Scale: Poor Standing balance comment: using RW                            Cognition Arousal/Alertness: Awake/alert Behavior During Therapy: WFL for tasks assessed/performed Overall Cognitive Status: Within Functional Limits for tasks assessed                                           Exercises General Exercises - Lower Extremity Long Arc Quad: Seated, AROM, Strengthening, Both, 10 reps Hip Flexion/Marching: Seated, AROM, Strengthening, Both, 10 reps Toe Raises: Seated, AROM, Strengthening, Both, 10 reps Heel Raises: Seated, AROM, Strengthening, Both, 10 reps    General Comments        Pertinent Vitals/Pain Pain Assessment Pain Assessment: Faces Faces Pain Scale: Hurts a little bit Pain Location: feet and legs Pain Descriptors / Indicators: Discomfort, Grimacing Pain Intervention(s): Limited activity within patient's tolerance, Monitored during session, Repositioned    Home Living                          Prior Function            PT Goals (current goals can now be found in the care plan section) Acute Rehab PT Goals Patient Stated Goal: return home after rehab PT Goal Formulation: With patient Time For Goal Achievement: 07/01/22 Potential to Achieve Goals: Good Progress towards PT goals: Progressing toward goals    Frequency    Min 3X/week      PT Plan Current plan remains appropriate    Co-evaluation              AM-PAC PT "6 Clicks" Mobility   Outcome Measure  Help needed turning from your back to your side while in a flat bed without using bedrails?: A Lot Help needed moving from lying on your back to sitting on the side of a flat bed without using bedrails?: A Lot Help needed moving to and from a bed to a chair (including a wheelchair)?: A Lot Help needed standing up from a chair using your arms (e.g., wheelchair or bedside chair)?: A Lot Help needed to walk in hospital room?: A Lot Help needed climbing 3-5 steps with a railing? : Total 6 Click Score: 11    End of Session Equipment Utilized During Treatment: Oxygen Activity Tolerance: Patient tolerated treatment well;Patient limited by fatigue Patient left: in bed;with call bell/phone within reach Nurse  Communication: Mobility status PT Visit Diagnosis: Unsteadiness on feet (R26.81);Other abnormalities of gait and mobility (R26.89);Muscle weakness (generalized) (M62.81)     Time: 1308-6578 PT Time Calculation (min) (ACUTE ONLY): 41 min  Charges:  $Therapeutic Exercise: 8-22 mins $Therapeutic Activity: 23-37 mins                     2:08 PM, 06/19/22 Ocie Bob, MPT Physical Therapist with [facility] [phone] office [phone] mobile phone

## 2022-06-19 NOTE — TOC Progression Note (Signed)
Transition of Care [facility]) - Progression Note    Patient Details  Name: [name] MRN: [id number] Date of Birth: 1935/03/15  [facility] CM/SW Contact  Elliot Gault, LCSW Phone Number: 06/19/2022, 11:36 AM  Clinical Narrative:     TOC following. MD anticipating dc tomorrow. Spoke with pt at bedside to review SNF bed offers and dc planning. Pt selects [facility]. Updated pt's wife by phone and she is in agreement.   Spoke with Jill Side at [facility] to update on above. They can accept pt tomorrow. Asked Jill Side to please call pt's wife at her request to review information on things she should bring for pt to have at the SNF.  TOC will follow up tomorrow.    Expected Discharge Plan: Skilled Nursing Facility Barriers to Discharge: Continued Medical Work up  Expected Discharge Plan and Services     Post Acute Care Choice: Skilled Nursing Facility Living arrangements for the past 2 months: Single Family Home                                       Social Determinants of Health (SDOH) Interventions SDOH Screenings   Tobacco Use: Medium Risk (06/15/2022)    Readmission Risk Interventions    06/17/2022    1:09 PM  Readmission Risk Prevention Plan  Post Dischage Appt Not Complete  Medication Screening Complete  Transportation Screening Complete

## 2022-06-20 ENCOUNTER — Inpatient Hospital Stay (HOSPITAL_COMMUNITY): Payer: Medicare Other

## 2022-06-20 DIAGNOSIS — R578 Other shock: Secondary | ICD-10-CM

## 2022-06-20 DIAGNOSIS — K920 Hematemesis: Secondary | ICD-10-CM

## 2022-06-20 DIAGNOSIS — J9601 Acute respiratory failure with hypoxia: Secondary | ICD-10-CM

## 2022-06-20 DIAGNOSIS — J189 Pneumonia, unspecified organism: Secondary | ICD-10-CM | POA: Diagnosis not present

## 2022-06-20 DIAGNOSIS — R579 Shock, unspecified: Secondary | ICD-10-CM

## 2022-06-20 DIAGNOSIS — N179 Acute kidney failure, unspecified: Secondary | ICD-10-CM | POA: Diagnosis not present

## 2022-06-20 DIAGNOSIS — I469 Cardiac arrest, cause unspecified: Secondary | ICD-10-CM

## 2022-06-20 DIAGNOSIS — E785 Hyperlipidemia, unspecified: Secondary | ICD-10-CM

## 2022-06-20 DIAGNOSIS — K529 Noninfective gastroenteritis and colitis, unspecified: Secondary | ICD-10-CM | POA: Diagnosis not present

## 2022-06-20 LAB — COMPREHENSIVE METABOLIC PANEL
ALT: 16 U/L (ref 0–44)
AST: 27 U/L (ref 15–41)
Albumin: 2.9 g/dL — ABNORMAL LOW (ref 3.5–5.0)
Alkaline Phosphatase: 54 U/L (ref 38–126)
Anion gap: 17 — ABNORMAL HIGH (ref 5–15)
BUN: 69 mg/dL — ABNORMAL HIGH (ref 8–23)
CO2: 23 mmol/L (ref 22–32)
Calcium: 8.8 mg/dL — ABNORMAL LOW (ref 8.9–10.3)
Chloride: 101 mmol/L (ref 98–111)
Creatinine, Ser: 4.07 mg/dL — ABNORMAL HIGH (ref 0.61–1.24)
GFR, Estimated: 14 mL/min — ABNORMAL LOW (ref >60)
Glucose, Bld: 238 mg/dL — ABNORMAL HIGH (ref 70–99)
Potassium: 3.2 mmol/L — ABNORMAL LOW (ref 3.5–5.1)
Sodium: 141 mmol/L (ref 135–145)
Total Bilirubin: 0.7 mg/dL (ref 0.3–1.2)
Total Protein: 7.1 g/dL (ref 6.5–8.1)

## 2022-06-20 LAB — BPAM RBC

## 2022-06-20 LAB — RENAL FUNCTION PANEL
Albumin: 2.7 g/dL — ABNORMAL LOW (ref 3.5–5.0)
Anion gap: 12 (ref 5–15)
BUN: 64 mg/dL — ABNORMAL HIGH (ref 8–23)
CO2: 25 mmol/L (ref 22–32)
Calcium: 8.5 mg/dL — ABNORMAL LOW (ref 8.9–10.3)
Chloride: 105 mmol/L (ref 98–111)
Creatinine, Ser: 3.75 mg/dL — ABNORMAL HIGH (ref 0.61–1.24)
GFR, Estimated: 15 mL/min — ABNORMAL LOW (ref >60)
Glucose, Bld: 127 mg/dL — ABNORMAL HIGH (ref 70–99)
Phosphorus: 4.1 mg/dL (ref 2.5–4.6)
Potassium: 3.1 mmol/L — ABNORMAL LOW (ref 3.5–5.1)
Sodium: 142 mmol/L (ref 135–145)

## 2022-06-20 LAB — POCT I-STAT 7, (LYTES, BLD GAS, ICA,H+H)
Acid-base deficit: 2 mmol/L (ref 0.0–2.0)
Bicarbonate: 25.2 mmol/L (ref 20.0–28.0)
Calcium, Ion: 1.19 mmol/L (ref 1.15–1.40)
HCT: 30 % — ABNORMAL LOW (ref 39.0–52.0)
Hemoglobin: 10.2 g/dL — ABNORMAL LOW (ref 13.0–17.0)
O2 Saturation: 92 %
Patient temperature: 98.4
Potassium: 3.6 mmol/L (ref 3.5–5.1)
Sodium: 139 mmol/L (ref 135–145)
TCO2: 27 mmol/L (ref 22–32)
pCO2 arterial: 53.9 mmHg — ABNORMAL HIGH (ref 32–48)
pH, Arterial: 7.277 — ABNORMAL LOW (ref 7.35–7.45)
pO2, Arterial: 73 mmHg — ABNORMAL LOW (ref 83–108)

## 2022-06-20 LAB — CBC
HCT: 34.1 % — ABNORMAL LOW (ref 39.0–52.0)
Hemoglobin: 10.4 g/dL — ABNORMAL LOW (ref 13.0–17.0)
MCH: 31 pg (ref 26.0–34.0)
MCHC: 30.5 g/dL (ref 30.0–36.0)
MCV: 101.5 fL — ABNORMAL HIGH (ref 80.0–100.0)
Platelets: 494 10*3/uL — ABNORMAL HIGH (ref 150–400)
RBC: 3.36 MIL/uL — ABNORMAL LOW (ref 4.22–5.81)
RDW: 14.3 % (ref 11.5–15.5)
WBC: 18.7 10*3/uL — ABNORMAL HIGH (ref 4.0–10.5)
nRBC: 0.2 % (ref 0.0–0.2)

## 2022-06-20 LAB — OCCULT BLOOD GASTRIC / DUODENUM (SPECIMEN CUP)
Occult Blood, Gastric: POSITIVE — AB
pH, Gastric: 3

## 2022-06-20 LAB — MAGNESIUM: Magnesium: 1.8 mg/dL (ref 1.7–2.4)

## 2022-06-20 LAB — LACTIC ACID, PLASMA: Lactic Acid, Venous: 6.4 mmol/L (ref 0.5–1.9)

## 2022-06-20 LAB — TYPE AND SCREEN: ABO/RH(D): O POS

## 2022-06-20 LAB — PREPARE RBC (CROSSMATCH)

## 2022-06-20 LAB — GLUCOSE, CAPILLARY
Glucose-Capillary: 175 mg/dL — ABNORMAL HIGH (ref 70–99)
Glucose-Capillary: 165 mg/dL — ABNORMAL HIGH (ref 70–99)

## 2022-06-20 LAB — ABO/RH: ABO/RH(D): O POS

## 2022-06-20 MED ORDER — VANCOMYCIN VARIABLE DOSE PER UNSTABLE RENAL FUNCTION (PHARMACIST DOSING): Status: DC

## 2022-06-20 MED ORDER — VANCOMYCIN HCL 1750 MG/350ML IV SOLN
1750.0000 mg | Freq: Once | INTRAVENOUS | Status: AC
  Administered 2022-06-20: 1750 mg via INTRAVENOUS
  Filled 2022-06-20: qty 350

## 2022-06-20 MED ORDER — PANTOPRAZOLE SODIUM 40 MG IV SOLR: 40.0000 mg | Freq: Two times a day (BID) | INTRAVENOUS | Status: DC

## 2022-06-20 MED ORDER — SUCCINYLCHOLINE CHLORIDE 200 MG/10ML IV SOSY
PREFILLED_SYRINGE | INTRAVENOUS | Status: AC
  Filled 2022-06-20: qty 10

## 2022-06-20 MED ORDER — LACTATED RINGERS IV BOLUS
1000.0000 mL | Freq: Once | INTRAVENOUS | Status: AC
  Administered 2022-06-20: 1000 mL via INTRAVENOUS

## 2022-06-20 MED ORDER — MIDAZOLAM HCL 2 MG/2ML IJ SOLN
INTRAMUSCULAR | Status: AC
  Filled 2022-06-20: qty 2

## 2022-06-20 MED ORDER — NOREPINEPHRINE 4 MG/250ML-% IV SOLN
INTRAVENOUS | Status: AC
  Administered 2022-06-20: 6 ug/min via INTRAVENOUS
  Filled 2022-06-20: qty 250

## 2022-06-20 MED ORDER — FENTANYL CITRATE PF 50 MCG/ML IJ SOSY
25.0000 ug | PREFILLED_SYRINGE | INTRAMUSCULAR | Status: DC | PRN
  Administered 2022-06-21 (×2): 25 ug via INTRAVENOUS
  Filled 2022-06-20 (×3): qty 1

## 2022-06-20 MED ORDER — PANTOPRAZOLE SODIUM 40 MG PO TBEC
40.0000 mg | DELAYED_RELEASE_TABLET | Freq: Two times a day (BID) | ORAL | Status: DC
  Administered 2022-06-20: 40 mg via ORAL
  Filled 2022-06-20: qty 1

## 2022-06-20 MED ORDER — PHENYLEPHRINE HCL-NACL 20-0.9 MG/250ML-% IV SOLN: 25.0000 ug/min | INTRAVENOUS | Status: DC

## 2022-06-20 MED ORDER — NOREPINEPHRINE 4 MG/250ML-% IV SOLN: 0.0000 ug/min | INTRAVENOUS | Status: DC

## 2022-06-20 MED ORDER — SODIUM BICARBONATE 8.4 % IV SOLN
50.0000 meq | Freq: Once | INTRAVENOUS | Status: AC
  Administered 2022-06-20: 50 meq via INTRAVENOUS

## 2022-06-20 MED ORDER — PHENYLEPHRINE HCL-NACL 20-0.9 MG/250ML-% IV SOLN: 0.0000 ug/min | INTRAVENOUS | Status: DC

## 2022-06-20 MED ORDER — SODIUM BICARBONATE 8.4 % IV SOLN
INTRAVENOUS | Status: AC
  Filled 2022-06-20: qty 50

## 2022-06-20 MED ORDER — ALUM & MAG HYDROXIDE-SIMETH 200-200-20 MG/5ML PO SUSP
15.0000 mL | ORAL | Status: DC | PRN
  Filled 2022-06-20: qty 30

## 2022-06-20 MED ORDER — SODIUM CHLORIDE 0.9 % IV SOLN
2.0000 g | INTRAVENOUS | Status: DC
  Filled 2022-06-20: qty 20

## 2022-06-20 MED ORDER — SODIUM CHLORIDE 0.9 % IV SOLN: INTRAVENOUS | Status: DC | PRN

## 2022-06-20 MED ORDER — DEXMEDETOMIDINE HCL IN NACL 400 MCG/100ML IV SOLN
0.0000 ug/kg/h | INTRAVENOUS | Status: DC
  Administered 2022-06-20: 0.7 ug/kg/h via INTRAVENOUS
  Administered 2022-06-20: 1 ug/kg/h via INTRAVENOUS
  Administered 2022-06-20: 0.7 ug/kg/h via INTRAVENOUS
  Administered 2022-06-20: 0.4 ug/kg/h via INTRAVENOUS
  Administered 2022-06-21 (×2): 1.2 ug/kg/h via INTRAVENOUS
  Administered 2022-06-21: 1 ug/kg/h via INTRAVENOUS
  Filled 2022-06-20 (×8): qty 100

## 2022-06-20 MED ORDER — PROPOFOL 1000 MG/100ML IV EMUL
INTRAVENOUS | Status: AC
  Filled 2022-06-20: qty 100

## 2022-06-20 MED ORDER — POTASSIUM CHLORIDE CRYS ER 20 MEQ PO TBCR
40.0000 meq | EXTENDED_RELEASE_TABLET | Freq: Once | ORAL | Status: AC
  Administered 2022-06-20: 40 meq via ORAL
  Filled 2022-06-20: qty 2

## 2022-06-20 MED ORDER — ORAL CARE MOUTH RINSE
15.0000 mL | OROMUCOSAL | Status: DC
  Administered 2022-06-20 – 2022-06-21 (×14): 15 mL via OROMUCOSAL

## 2022-06-20 MED ORDER — SODIUM CHLORIDE 0.9 % IV SOLN: 250.0000 mL | INTRAVENOUS | Status: DC

## 2022-06-20 MED ORDER — PIPERACILLIN-TAZOBACTAM IN DEX 2-0.25 GM/50ML IV SOLN
2.2500 g | Freq: Three times a day (TID) | INTRAVENOUS | Status: DC
  Administered 2022-06-20 – 2022-06-21 (×4): 2.25 g via INTRAVENOUS
  Filled 2022-06-20 (×6): qty 50

## 2022-06-20 MED ORDER — SODIUM CHLORIDE 0.9% IV SOLUTION: Freq: Once | INTRAVENOUS | Status: DC

## 2022-06-20 MED ORDER — FENTANYL CITRATE PF 50 MCG/ML IJ SOSY
PREFILLED_SYRINGE | INTRAMUSCULAR | Status: AC
  Filled 2022-06-20: qty 2

## 2022-06-20 MED ORDER — LACTATED RINGERS IV SOLN: INTRAVENOUS | Status: DC

## 2022-06-20 MED ORDER — FENTANYL 2500MCG IN NS 250ML (10MCG/ML) PREMIX INFUSION
INTRAVENOUS | Status: AC
  Filled 2022-06-20: qty 250

## 2022-06-20 MED ORDER — DEXMEDETOMIDINE HCL IN NACL 400 MCG/100ML IV SOLN
INTRAVENOUS | Status: AC
  Filled 2022-06-20: qty 100

## 2022-06-20 MED ORDER — FENTANYL CITRATE PF 50 MCG/ML IJ SOSY
25.0000 ug | PREFILLED_SYRINGE | INTRAMUSCULAR | Status: DC | PRN
  Administered 2022-06-20: 50 ug via INTRAVENOUS
  Administered 2022-06-21: 25 ug via INTRAVENOUS
  Filled 2022-06-20: qty 1
  Filled 2022-06-20: qty 2
  Filled 2022-06-20: qty 1

## 2022-06-20 MED ORDER — ORAL CARE MOUTH RINSE: 15.0000 mL | OROMUCOSAL | Status: DC | PRN

## 2022-06-20 MED ORDER — ALBUMIN HUMAN 5 % IV SOLN
12.5000 g | Freq: Once | INTRAVENOUS | Status: AC
  Administered 2022-06-20: 12.5 g via INTRAVENOUS
  Filled 2022-06-20: qty 250

## 2022-06-20 MED ORDER — PANTOPRAZOLE 80MG IVPB - SIMPLE MED
80.0000 mg | Freq: Once | INTRAVENOUS | Status: AC
  Administered 2022-06-20: 80 mg via INTRAVENOUS
  Filled 2022-06-20: qty 100

## 2022-06-20 MED ORDER — DOCUSATE SODIUM 50 MG/5ML PO LIQD: 100.0000 mg | Freq: Two times a day (BID) | ORAL | Status: DC

## 2022-06-20 MED ORDER — POLYETHYLENE GLYCOL 3350 17 G PO PACK: 17.0000 g | PACK | Freq: Every day | ORAL | Status: DC

## 2022-06-20 MED ORDER — LACTATED RINGERS IV BOLUS: 1000.0000 mL | Freq: Once | INTRAVENOUS | Status: DC

## 2022-06-20 MED ORDER — PROCHLORPERAZINE EDISYLATE 10 MG/2ML IJ SOLN
10.0000 mg | Freq: Four times a day (QID) | INTRAMUSCULAR | Status: DC | PRN
  Administered 2022-06-20: 10 mg via INTRAVENOUS
  Filled 2022-06-20 (×2): qty 2

## 2022-06-20 MED ORDER — METHYLPREDNISOLONE SODIUM SUCC 125 MG IJ SOLR
60.0000 mg | Freq: Two times a day (BID) | INTRAMUSCULAR | Status: DC
  Administered 2022-06-20 – 2022-06-22 (×4): 60 mg via INTRAVENOUS
  Filled 2022-06-20 (×4): qty 2

## 2022-06-20 MED ORDER — NOREPINEPHRINE 4 MG/250ML-% IV SOLN
2.0000 ug/min | INTRAVENOUS | Status: DC
  Administered 2022-06-20: 16 ug/min via INTRAVENOUS
  Administered 2022-06-20: 24 ug/min via INTRAVENOUS
  Administered 2022-06-20: 30 ug/min via INTRAVENOUS
  Administered 2022-06-20: 28 ug/min via INTRAVENOUS
  Administered 2022-06-21: 8 ug/min via INTRAVENOUS
  Filled 2022-06-20 (×5): qty 250

## 2022-06-20 MED ORDER — ROCURONIUM BROMIDE 10 MG/ML (PF) SYRINGE
PREFILLED_SYRINGE | INTRAVENOUS | Status: AC
  Filled 2022-06-20: qty 10

## 2022-06-20 MED ORDER — PANTOPRAZOLE INFUSION (NEW) - SIMPLE MED
8.0000 mg/h | INTRAVENOUS | Status: DC
  Administered 2022-06-20 – 2022-06-21 (×2): 8 mg/h via INTRAVENOUS
  Filled 2022-06-20 (×3): qty 100

## 2022-06-20 MED ORDER — DEXMEDETOMIDINE HCL IN NACL 200 MCG/50ML IV SOLN: 0.0000 ug/kg/h | INTRAVENOUS | Status: DC

## 2022-06-20 MED ORDER — ETOMIDATE 2 MG/ML IV SOLN
INTRAVENOUS | Status: AC
  Filled 2022-06-20: qty 20

## 2022-06-20 NOTE — ED Provider Notes (Signed)
CODE BLUE called on the floor.  Arrived to find patient getting bag-valve-mask ventilation and at that point had had return of pulses with code resuscitation underway.  I continued with suctioning airway and mask assisted ventilation until respiratory had GlideScope at bedside and medications of etomidate and succinylcholine brought to bedside.  Preintubation there is difficulty establishing oxygen saturation level with poor waveform and appearance of decreased peripheral perfusion.  Aggressive suctioning and bag-valve-mask continued.  Patient had copious amounts of brown liquid secretions coming from the airway.  Once intubation supplies at bedside, etomidate and succinylcholine pushed.  Patient was intubated with video laryngoscopy.  I was able to clear the airway with suction and visualize the glottis.  Tube passed on first attempt under direct visualization.  Good color change.  Breath sounds bilaterally.  I also passed OG tube with direct visualization with glide scope.  Postintubation with waveform 90% oxygen saturation.  At this point continue care and management per hospitalist team managing the patient.   Arby Barrette, MD 06/20/22 1736

## 2022-06-20 NOTE — Progress Notes (Signed)
PROGRESS NOTE  CHUEYEE KOEPPEL ZOX:[id number] DOB: March 02, 1935 DOA: 06/11/2022 PCP: Verlee Monte, NP  Brief History:  87 year old male with a history of coronary artery disease, hypertension, chronic respiratory failure on 2 L at nighttime, COPD, colorectal adenocarcinoma (09/2009) status post partial colectomy (01/2010)  and chemoradiation with Xeloda.  He finished radiation 12/04/2009.  He had follow-up colonoscopy 12/2013 which was negative.  His CEA tumor markers have been normal.  He states that he has not followed up with oncology in over 5 years at this point. Nevertheless, the patient has been complaining of 2 to 3-week history of abdominal discomfort and distention with difficulty urinating.  He feels like he has incomplete bladder emptying.  He has had some nausea without emesis.  He denies any hematuria or dysuria.  He has had some urinary incontinence.  Regarding his abdominal discomfort, the patient states that it is generalized.  He has had loose stools on a daily basis for the last 2 to 3 weeks.  There is no hematochezia or melena.  He denies any new medications.  He denies any recent travels or exotic foods.  He denies any fevers, chills, chest pain, shortness breath, cough, hemoptysis.  The patient denies any NSAIDs.  In the ED, the patient had low-grade temperature of 99.2 F.  He was hemodynamically stable.  Oxygen saturation was 95% on 2 L.  WBC 15.6, hemoglobin 12.3, platelets 279,000.  LFTs were unremarkable.  Sodium 139, potassium 5.6, bicarbonate 15, serum creatinine 7.04.  UA showed 21-50 WBC. CT of the abdomen pelvis showed LLL consolidation, bilateral hydronephrosis which was already seen on a previous CT chest 06/05/2022, posterior wall bladder thickening with high attenuation material around the Foley catheter, wall thickening @descending  colon at the level of the rectosigmoid.  There is diffuse stranding in the perirectal region surrounding the prostate and bladder.   There was bilateral inguinal and retroperitoneal lymphadenopathy. The patient was admitted for further evaluation and treatment of his acute kidney injury and colitis.  A Foley catheter was inserted in the emergency department. Renal function did not improve, so nephrology and urology were consulted.   06/20/22: Pt had episodes of coffee ground emesis and hematemesis with hemorrhagic shock, code blue called and intubated, subsequently transferred to ICU and accepted for transfer to [facility]     Assessment/Plan:  Hemorrhagic Shock -large amount of coffee ground emesis at bedside this morning -code blue called with respiratory arrest, I was present for code blue resuscitation -ED provider intubated at bedside for airway protection -ordered for type and screen and transfuse 2 units PRBC -IV pantoprazole BID ordered -IV norepinephrine infusion ordered -called and spoke with PCCM attending physician Dr. Katrinka Blazing who agreed to accept to [facility] in transfer -transferred to ICU at [facility] while awaiting for bed at [facility] -ICU sedation orders placed -conference with wife and daughter about change in status and current plan of care -ordered for stat CXR to confirm tube placement and NG placement  -appears to have aspirated, continue IV antibiotics  -discussed with family that patient could have a metastatic lesion in GI tract causing this bleed -ordered stat labs -lactic acid 6.5, ordered bolus LR infusion  AKI -Last serum creatinine 0.80 on 08/15/2019 -secondary to obstructive uropathy -Obtain renal ultrasound--persistent hydronephrosis -Bilateral hydronephrosis seen on CT abdomen -No improvement in serum creatinine after Foley insertion -Nephrology consult appreciated -Accurate I's and O's -Appears hypervolemic>>echo -5/7 Echo--EF 55-60, no WMA, G1DD, normal RVF -06/15/22--bilateral  nephrostomy tubes placed -creatinine had been trending down before he had a code blue on 5/11   Hyperkalemia - treated and resolved   -Patient was started on bicarbonate drip -Given Lokelma -resolved    Colitis -Descending colon and perirectal thickening as discussed above -GI consulted and now signed off -Stool pathogen panel--neg -Stool for C. Difficile-neg -Continue empiric ceftriaxone and metronidazole x 7 days per GI service     Bilateral hydronephrosis/Bladder mass -Foley inserted--did not resolve -due to bladder mass causing obstructive uropathy -Renal ultrasound post Foley insertion--persistent hydronephrosis -06/15/22--bilateral nephrostomy tubes placed -Dr. Ronne Binning following>>will ultimately need cystoscopy -Dr. Ronne Binning planning cystoscopy to be done 1 week after he discharge from SN[facility]   Lobar pneumonia -5/5 CT abd--shows LLL consolidation -continue ceftriaxone with stop date entered 06/20/22 -unfortunately it appears he aspirated during episode of hematemesis -continue antibiotics for now    Left leg cellulitis -Appears streptococcal -Venous duplex-neg -Continue ceftriaxone -resolved with antibiotic treatment   Hypomagnesemia -IV replacement ordered -repleted   Essential hypertension -Restart metoprolol at lower dose -Hold HCTZ -Hold benazepril -Holding amlodipine temporarily -with hemorrhagic shock ordered PRBC and norepinephrine infusion   Acute on chronic respiratory failure with hypoxia -Chronically on 2 L at nighttime -Secondary to pulmonary edema and pneumonia -Discontinued bicarbonate drip -Pt intubated at bedside on 5/11    COPD -Continue bronchodilators   Coronary artery disease -No chest pain presently -Continue metoprolol   Mixed hyperlipidemia -Continue statin   Obesity -BMI 32.10 -lifestyle modification     Family Communication:  family at bedside 5/8, 5/9, 5/11    Consultants:  renal, urology, GI   Code Status:  FULL   DVT Prophylaxis:  [place] Heparin     Procedures: As Listed in Progress Note Above   Antibiotics: Ceftriaxone 5/5>> Metronidazole  5/5>>   Subjective: Pt was taking morning meds and had significant episode of coffee ground emesis and when being cleaned by staff had another large episode of hematemesis where he passed out went into respiratory arrest, did not lose pulse, code blue called, he was intubated and NG tube placed where large amt of coffee ground material suctioned.      Objective: Vitals:   06/20/22 1112 06/20/22 1113 06/20/22 1114 06/20/22 1115  BP: (!) 58/37   (!) 63/42  Pulse: (!) 107 (!) 105 (!) 106 (!) 102  Resp: (!) 29 (!) 26 (!) 26 (!) 26  Temp:  97.7 F (36.5 C)    TempSrc:  Axillary    SpO2: 97% 97% 98% 97%  Weight:      Height:        Intake/Output Summary (Last 24 hours) at 06/20/2022 1119 Last data filed at 06/20/2022 1117 Gross per 24 hour  Intake 628.67 ml  Output 1440 ml  Net -811.33 ml   Weight change:  Exam:  General:  intubated, sedated on vent, coffee ground emesis over face, neck and trunk; appears pale and critically ill HEENT: No icterus, No thrush, No neck mass, [place]/AT Cardiovascular: tachycardic rate, normal S1/S2, no rubs, no gallops Respiratory: diffuse rales heard bilaterally Abdomen: bilateral urostomy tubes seen with blood-tinged fluid in both bags.  R>L. Soft, distended  Extremities: No edema, No lymphangitis, No petechiae, No rashes, no synovitis  Data Reviewed: I have personally reviewed following labs and imaging studies Basic Metabolic Panel: Recent Labs  Lab 06/16/22 0412 06/17/22 0410 06/18/22 0431 06/19/22 0421 06/20/22 0409 06/20/22 0954  NA 142 142 141 141 142 141  K 4.4 3.8 3.4* 3.4* 3.1* 3.2*  CL 108 108 107 106 105 101  CO2 20* 21* 24 23 25 23   GLUCOSE 101* 109* 130* 117* 127* 238*  BUN 97* 87* 76* 65* 64* 69*  CREATININE 6.77* 5.91* 4.96* 4.08* 3.75* 4.07*  CALCIUM 8.4* 8.2* 7.9* 8.1* 8.5* 8.8*  MG  --   --  1.5*  --  1.8  --   PHOS 7.6* 6.3* 5.5* 4.1 4.1  --    Liver Function Tests: Recent Labs  Lab 06/25/2022 1459 06/15/22 0346  06/16/22 0412 06/17/22 0410 06/18/22 0431 06/19/22 0421 06/20/22 0409 06/20/22 0954  AST 13* 9*  --   --   --   --   --  27  ALT 11 9  --   --   --   --   --  16  ALKPHOS 63 45  --   --   --   --   --  54  BILITOT 0.9 0.6  --   --   --   --   --  0.7  PROT 7.3 6.2*  --   --   --   --   --  7.1  ALBUMIN 3.5 2.8*   < > 2.7* 2.6* 2.6* 2.7* 2.9*   < > = values in this interval not displayed.   Recent Labs  Lab 06/30/2022 1459  LIPASE 30   No results for input(s): "AMMONIA" in the last 168 hours. Coagulation Profile: Recent Labs  Lab 06/15/22 1352  INR 1.2   CBC: Recent Labs  Lab 06/16/22 0412 06/17/22 0410 06/18/22 0431 06/19/22 0421 06/20/22 0954  WBC 13.1* 11.8* 10.4 11.5* 18.7*  HGB 9.5* 9.4* 8.7* 9.7* 10.4*  HCT 29.2* 29.6* 27.1* 30.7* 34.1*  MCV 96.4 96.7 98.9 97.8 101.5*  PLT 383 394 366 406* 494*   Cardiac Enzymes: No results for input(s): "CKTOTAL", "CKMB", "CKMBINDEX", "TROPONINI" in the last 168 hours. BNP: Invalid input(s): "POCBNP" CBG: No results for input(s): "GLUCAP" in the last 168 hours. HbA1C: No results for input(s): "HGBA1C" in the last 72 hours. Urine analysis:    Component Value Date/Time   COLORURINE YELLOW 06/17/2022 1455   APPEARANCEUR CLOUDY (A) 06/12/2022 1455   LABSPEC 1.011 06/12/2022 1455   PHURINE 5.0 06/20/2022 1455   GLUCOSEU NEGATIVE 06/12/2022 1455   HGBUR LARGE (A) 06/15/2022 1455   BILIRUBINUR NEGATIVE 06/29/2022 1455   KETONESUR 5 (A) 06/20/2022 1455   PROTEINUR 100 (A) 07/08/2022 1455   NITRITE NEGATIVE 06/21/2022 1455   LEUKOCYTESUR MODERATE (A) 06/13/2022 1455   Recent Results (from the past 240 hour(s))  Blood culture (routine x 2)     Status: None   Collection Time: 06/16/2022  2:59 PM   Specimen: Right Antecubital; Blood  Result Value Ref Range Status   Specimen Description   Final    RIGHT ANTECUBITAL BOTTLES DRAWN AEROBIC AND ANAEROBIC   Special Requests   Final    Blood Culture results may not be optimal  due to an excessive volume of blood received in culture bottles   Culture   Final    NO GROWTH 5 DAYS Performed at [facility], [address] [place], [place] [address]    Report Status 06/19/2022 FINAL  Final  Blood culture (routine x 2)     Status: Abnormal   Collection Time: 06/20/2022  3:12 PM   Specimen: Left Antecubital; Blood  Result Value Ref Range Status   Specimen Description   Final    LEFT ANTECUBITAL BOTTLES DRAWN AEROBIC AND ANAEROBIC  Performed at [facility], [address], [place], [place] [address]    Special Requests   Final    Blood Culture results may not be optimal due to an excessive volume of blood received in culture bottles Performed at [facility], [address] [place], [place] [address]    Culture  Setup Time   Final    GRAM POSITIVE COCCI Gram Stain Report Called to,Read Back By and Verified With: M. CATES LAND @ 06/15/22 ANAEROBIC BOTTLE ONLY CRITICAL RESULT CALLED TO, READ BACK BY AND VERIFIED WITH: PHARMD LAURIE POOLE ON 06/15/22 @ 1715 BY DRT    Culture (A)  Final    AEROCOCCUS VIRIDANS Standardized susceptibility testing for this organism is not available. Performed at [facility], [address], [place], [place] [address]    Report Status 06/17/2022 FINAL  Final  Blood Culture ID Panel (Reflexed)     Status: None   Collection Time: 07/08/2022  3:12 PM  Result Value Ref Range Status   Enterococcus faecalis NOT DETECTED NOT DETECTED Final   Enterococcus Faecium NOT DETECTED NOT DETECTED Final   Listeria monocytogenes NOT DETECTED NOT DETECTED Final   Staphylococcus species NOT DETECTED NOT DETECTED Final   Staphylococcus aureus (BCID) NOT DETECTED NOT DETECTED Final   Staphylococcus epidermidis NOT DETECTED NOT DETECTED Final   Staphylococcus lugdunensis NOT DETECTED NOT DETECTED Final   Streptococcus species NOT DETECTED NOT DETECTED Final   Streptococcus agalactiae NOT DETECTED NOT DETECTED Final   Streptococcus pneumoniae NOT  DETECTED NOT DETECTED Final   Streptococcus pyogenes NOT DETECTED NOT DETECTED Final   A.calcoaceticus-baumannii NOT DETECTED NOT DETECTED Final   Bacteroides fragilis NOT DETECTED NOT DETECTED Final   Enterobacterales NOT DETECTED NOT DETECTED Final   Enterobacter cloacae complex NOT DETECTED NOT DETECTED Final   Escherichia coli NOT DETECTED NOT DETECTED Final   Klebsiella aerogenes NOT DETECTED NOT DETECTED Final   Klebsiella oxytoca NOT DETECTED NOT DETECTED Final   Klebsiella pneumoniae NOT DETECTED NOT DETECTED Final   Proteus species NOT DETECTED NOT DETECTED Final   Salmonella species NOT DETECTED NOT DETECTED Final   Serratia marcescens NOT DETECTED NOT DETECTED Final   Haemophilus influenzae NOT DETECTED NOT DETECTED Final   Neisseria meningitidis NOT DETECTED NOT DETECTED Final   Pseudomonas aeruginosa NOT DETECTED NOT DETECTED Final   Stenotrophomonas maltophilia NOT DETECTED NOT DETECTED Final   Candida albicans NOT DETECTED NOT DETECTED Final   Candida auris NOT DETECTED NOT DETECTED Final   Candida glabrata NOT DETECTED NOT DETECTED Final   Candida krusei NOT DETECTED NOT DETECTED Final   Candida parapsilosis NOT DETECTED NOT DETECTED Final   Candida tropicalis NOT DETECTED NOT DETECTED Final   Cryptococcus neoformans/gattii NOT DETECTED NOT DETECTED Final    Comment: Performed at [facility], [address], [place], [place] [address]  Urine Culture     Status: Abnormal   Collection Time: 07/09/2022  4:05 PM   Specimen: Urine, Clean Catch  Result Value Ref Range Status   Specimen Description   Final    URINE, CLEAN CATCH Performed at [facility], [address], [place], [place] [address]    Special Requests   Final    NONE Performed at [facility], [address], [place], [place] [address]    Culture MULTIPLE SPECIES PRESENT, SUGGEST RECOLLECTION (A)  Final   Report Status 06/15/2022 FINAL  Final  Gastrointestinal Panel by PCR , Stool     Status: None  Collection Time: 06/15/22  2:14 PM   Specimen: STOOL  Result Value Ref Range Status   Campylobacter species NOT DETECTED NOT DETECTED Final   Plesimonas shigelloides NOT DETECTED NOT DETECTED Final   Salmonella species NOT DETECTED NOT DETECTED Final   Yersinia enterocolitica NOT DETECTED NOT DETECTED Final   Vibrio species NOT DETECTED NOT DETECTED Final   Vibrio cholerae NOT DETECTED NOT DETECTED Final   Enteroaggregative E coli (EAEC) NOT DETECTED NOT DETECTED Final   Enteropathogenic E coli (EPEC) NOT DETECTED NOT DETECTED Final   Enterotoxigenic E coli (ETEC) NOT DETECTED NOT DETECTED Final   Shiga like toxin producing E coli (STEC) NOT DETECTED NOT DETECTED Final   Shigella/Enteroinvasive E coli (EIEC) NOT DETECTED NOT DETECTED Final   Cryptosporidium NOT DETECTED NOT DETECTED Final   Cyclospora cayetanensis NOT DETECTED NOT DETECTED Final   Entamoeba histolytica NOT DETECTED NOT DETECTED Final   Giardia lamblia NOT DETECTED NOT DETECTED Final   Adenovirus F40/41 NOT DETECTED NOT DETECTED Final   Astrovirus NOT DETECTED NOT DETECTED Final   Norovirus GI/GII NOT DETECTED NOT DETECTED Final   Rotavirus A NOT DETECTED NOT DETECTED Final   Sapovirus (I, II, IV, and V) NOT DETECTED NOT DETECTED Final    Comment: Performed at [facility], [address] [place], [place] [address]  C Difficile Quick Screen w PCR reflex     Status: None   Collection Time: 06/15/22  2:14 PM   Specimen: STOOL  Result Value Ref Range Status   C Diff antigen NEGATIVE NEGATIVE Final   C Diff toxin NEGATIVE NEGATIVE Final   C Diff interpretation No C. difficile detected.  Final    Comment: Performed at [facility], [address] [place], [place] [address]     Scheduled Meds:  sodium chloride   Intravenous Once   Chlorhexidine Gluconate Cloth  6 each Topical Daily   docusate  100 mg Per Tube BID   etomidate       fentaNYL       Gerhardt's butt cream   Topical BID   heparin  5,000 Units  Subcutaneous Q12H   metoprolol tartrate  12.5 mg Oral BID   pantoprazole (PROTONIX) IV  40 mg Intravenous Q12H   [START ON 06/21/2022] polyethylene glycol  17 g Per Tube Daily   succinylcholine       Continuous Infusions:  sodium chloride     cefTRIAXone (ROCEPHIN)  IV 1 g (06/19/22 1419)   dexmedetomidine (PRECEDEX) IV infusion 0.4 mcg/kg/hr (06/20/22 1117)   metronidazole 500 mg (06/20/22 0902)   norepinephrine (LEVOPHED) Adult infusion 9 mcg/min (06/20/22 1117)    Procedures/Studies: ECHOCARDIOGRAM COMPLETE  Result Date: 06/16/2022    ECHOCARDIOGRAM REPORT   Patient Name:   LAWRANCE HEINICKE Date of Exam: 06/16/2022 Medical Rec #:  [id number]     Height:       74.0 in Accession #:    [id number]    Weight:       250.0 lb Date of Birth:  03-23-1935      BSA:          2.390 m Patient Age:    87 years      BP:           144/75 mmHg Patient Gender: M             HR:           94 bpm. [facility] Procedure: 2D Echo, Cardiac Doppler, Color  Doppler and Intracardiac            Opacification Agent Indications:    CHF-Acute Diastolic I50.31  History:        Patient has prior history of Echocardiogram examinations, most                 recent 11/19/2018. CAD, Signs/Symptoms:Shortness of Breath; Risk                 Factors:Hypertension, Dyslipidemia and Former Smoker.  Sonographer:    Aron Baba Referring Phys: [phone] DAVID TAT  Sonographer Comments: Technically difficult study due to poor echo windows. Image acquisition challenging due to COPD. IMPRESSIONS  1. Left ventricular ejection fraction, by estimation, is 55 to 60%. The left ventricle has normal function. The left ventricle has no regional wall motion abnormalities. There is mild left ventricular hypertrophy. Left ventricular diastolic parameters are consistent with Grade I diastolic dysfunction (impaired relaxation).  2. RV not well visualized. Grossly appears normal in size and function. . Right ventricular systolic function was not well  visualized. The right ventricular size is not well visualized. There is normal pulmonary artery systolic pressure.  3. Left atrial size was mildly dilated.  4. The mitral valve is normal in structure. No evidence of mitral valve regurgitation. No evidence of mitral stenosis.  5. The aortic valve is tricuspid. There is moderate calcification of the aortic valve. There is moderate thickening of the aortic valve. Aortic valve regurgitation is not visualized. No aortic stenosis is present.  6. There is mild dilatation of the ascending aorta, measuring 43 mm.  7. The inferior vena cava is normal in size with greater than 50% respiratory variability, suggesting right atrial pressure of 3 mmHg. FINDINGS  Left Ventricle: Left ventricular ejection fraction, by estimation, is 55 to 60%. The left ventricle has normal function. The left ventricle has no regional wall motion abnormalities. Definity contrast agent was given IV to delineate the left ventricular  endocardial borders. The left ventricular internal cavity size was normal in size. There is mild left ventricular hypertrophy. Left ventricular diastolic parameters are consistent with Grade I diastolic dysfunction (impaired relaxation). Normal left ventricular filling pressure. Right Ventricle: RV not well visualized. Grossly appears normal in size and function. The right ventricular size is not well visualized. Right vetricular wall thickness was not well visualized. Right ventricular systolic function was not well visualized.  There is normal pulmonary artery systolic pressure. The tricuspid regurgitant velocity is 1.62 m/s, and with an assumed right atrial pressure of 3 mmHg, the estimated right ventricular systolic pressure is 13.5 mmHg. Left Atrium: Left atrial size was mildly dilated. Right Atrium: Right atrial size was normal in size. Pericardium: There is no evidence of pericardial effusion. Mitral Valve: The mitral valve is normal in structure. There is mild  thickening of the mitral valve leaflet(s). There is mild calcification of the mitral valve leaflet(s). Mild mitral annular calcification. No evidence of mitral valve regurgitation. No evidence of mitral valve stenosis. Tricuspid Valve: The tricuspid valve is normal in structure. Tricuspid valve regurgitation is not demonstrated. No evidence of tricuspid stenosis. Aortic Valve: The aortic valve is tricuspid. There is moderate calcification of the aortic valve. There is moderate thickening of the aortic valve. There is moderate aortic valve annular calcification. Aortic valve regurgitation is not visualized. No aortic stenosis is present. Aortic valve mean gradient measures 9.0 mmHg. Aortic valve peak gradient measures 17.6 mmHg. Aortic valve area, by VTI measures 2.99 cm. Pulmonic Valve: The pulmonic  valve was not well visualized. Pulmonic valve regurgitation is not visualized. No evidence of pulmonic stenosis. Aorta: The aortic root is normal in size and structure. There is mild dilatation of the ascending aorta, measuring 43 mm. Venous: The inferior vena cava is normal in size with greater than 50% respiratory variability, suggesting right atrial pressure of 3 mmHg. IAS/Shunts: No atrial level shunt detected by color flow Doppler.  LEFT VENTRICLE PLAX 2D LVIDd:         4.10 cm   Diastology LVIDs:         3.00 cm   LV e' medial:    4.88 cm/s LV PW:         1.30 cm   LV E/e' medial:  15.5 LV IVS:        1.10 cm   LV e' lateral:   9.67 cm/s LVOT diam:     2.40 cm   LV E/e' lateral: 7.8 LV SV:         102 LV SV Index:   43 LVOT Area:     4.52 cm  LEFT ATRIUM           Index        RIGHT ATRIUM           Index LA diam:      4.00 cm 1.67 cm/m   RA Area:     17.10 cm LA Vol (A2C): 77.0 ml 32.22 ml/m  RA Volume:   32.70 ml  13.68 ml/m LA Vol (A4C): 92.1 ml 38.54 ml/m  AORTIC VALVE                     PULMONIC VALVE AV Area (Vmax):    2.95 cm      PR End Diast Vel: 6.97 msec AV Area (Vmean):   2.70 cm AV Area  (VTI):     2.99 cm AV Vmax:           210.00 cm/s AV Vmean:          137.500 cm/s AV VTI:            0.340 m AV Peak Grad:      17.6 mmHg AV Mean Grad:      9.0 mmHg LVOT Vmax:         137.00 cm/s LVOT Vmean:        82.000 cm/s LVOT VTI:          0.225 m LVOT/AV VTI ratio: 0.66  AORTA Ao Root diam: 3.80 cm Ao Asc diam:  4.30 cm MITRAL VALVE                TRICUSPID VALVE MV Area (PHT): 2.84 cm     TR Peak grad:   10.5 mmHg MV Decel Time: 267 msec     TR Vmax:        162.00 cm/s MV E velocity: 75.70 cm/s MV A velocity: 107.00 cm/s  SHUNTS MV E/A ratio:  0.71         Systemic VTI:  0.22 m                             Systemic Diam: 2.40 cm Dina Rich MD Electronically signed by Dina Rich MD Signature Date/Time: 06/16/2022/12:06:51 PM    Final    IR NEPHROSTOMY PLACEMENT LEFT  Result Date: 06/15/2022 INDICATION: 87 year old male with bilateral hydronephrosis and acute renal failure. He presents for  bilateral percutaneous nephrostomy tube placement. EXAM: IR NEPHROSTOMY PLACEMENT LEFT; IR NEPHROSTOMY PLACEMENT RIGHT COMPARISON:  None Available. MEDICATIONS: Rocephin 2 g IV; The antibiotic was administered in an appropriate time frame prior to skin puncture. ANESTHESIA/SEDATION: Fentanyl 100 mcg IV; Versed 2 mg IV Moderate Sedation Time:  25 minutes The patient's vital signs and level of consciousness were continuously monitored during the procedure by the interventional radiology nurse under my direct supervision. CONTRAST:  20mL OMNIPAQUE IOHEXOL 300 MG/ML SOLN - administered into the collecting system(s) FLUOROSCOPY: Fluoroscopy Time:  minutes  seconds ( mGy). COMPLICATIONS: None immediate. TECHNIQUE: The procedure, risks, benefits, and alternatives were explained to the patient. Questions regarding the procedure were encouraged and answered. The patient understands and consents to the procedure. LEFT The left flank was prepped with chlorhexidine in a sterile fashion, and a sterile drape was applied  covering the operative field. A sterile gown and sterile gloves were used for the procedure. Local anesthesia was provided with 1% Lidocaine. The left flank was interrogated with ultrasound and the left kidney identified. The kidney is hydronephrotic. A suitable access site on the skin overlying the lower pole, posterior calix was identified. After local mg anesthesia was achieved, a small skin nick was made with an 11 blade scalpel. A 21 gauge Accustick needle was then advanced under direct sonographic guidance into the lower pole of the left kidney. A 0.018 inch wire was advanced under fluoroscopic guidance into the left renal collecting system. The Accustick sheath was then advanced over the wire and a 0.018 system exchanged for a 0.035 system. Gentle hand injection of contrast material confirms placement of the sheath within the renal collecting system. There is severe hydronephrosis. The tract from the scan into the renal collecting system was then dilated serially to 10-French. A 10-French Cook all-purpose drain was then placed and positioned under fluoroscopic guidance. The locking loop is well formed within the left renal pelvis. The catheter was secured to the skin with 2-0 Prolene and a sterile bandage was placed. Catheter was left to gravity bag drainage. RIGHT The right flank was prepped with chlorhexidine in a sterile fashion, and a sterile drape was applied covering the operative field. A sterile gown and sterile gloves were used for the procedure. Local anesthesia was provided with 1% Lidocaine. The right flank was interrogated with ultrasound and the left kidney identified. The kidney is hydronephrotic. There are multiple large cysts overlying the posterior calices. A suitable access site on the skin overlying the lower pole, posterior calix was identified. After local anesthesia was achieved, a small skin nick was made with an 11 blade scalpel. A 21 gauge Accustick needle was then advanced under  direct sonographic guidance into the cyst. Aspiration was performed. The cyst was decompressed. Next, the Accustick needle was advanced into the calyx within the lower pole of the right kidney. A 0.018 inch wire was advanced under fluoroscopic guidance into the left renal collecting system. The Accustick sheath was then advanced over the wire and a 0.018 system exchanged for a 0.035 system. Gentle hand injection of contrast material confirms placement of the sheath within the renal collecting system. There is severe hydronephrosis. The tract from the scan into the renal collecting system was then dilated serially to 10-French. A 10-French Cook all-purpose drain was then placed and positioned under fluoroscopic guidance. The locking loop is well formed within the left renal pelvis. The catheter was secured to the skin with 2-0 Prolene and a sterile bandage was placed. Catheter was  left to gravity bag drainage. IMPRESSION: Successful placement of a bilateral 10 French percutaneous nephrostomy tubes. Electronically Signed   By: Malachy Moan M.D.   On: 06/15/2022 17:34   IR NEPHROSTOMY PLACEMENT RIGHT  Result Date: 06/15/2022 INDICATION: 87 year old male with bilateral hydronephrosis and acute renal failure. He presents for bilateral percutaneous nephrostomy tube placement. EXAM: IR NEPHROSTOMY PLACEMENT LEFT; IR NEPHROSTOMY PLACEMENT RIGHT COMPARISON:  None Available. MEDICATIONS: Rocephin 2 g IV; The antibiotic was administered in an appropriate time frame prior to skin puncture. ANESTHESIA/SEDATION: Fentanyl 100 mcg IV; Versed 2 mg IV Moderate Sedation Time:  25 minutes The patient's vital signs and level of consciousness were continuously monitored during the procedure by the interventional radiology nurse under my direct supervision. CONTRAST:  20mL OMNIPAQUE IOHEXOL 300 MG/ML SOLN - administered into the collecting system(s) FLUOROSCOPY: Fluoroscopy Time:  minutes  seconds ( mGy). COMPLICATIONS: None  immediate. TECHNIQUE: The procedure, risks, benefits, and alternatives were explained to the patient. Questions regarding the procedure were encouraged and answered. The patient understands and consents to the procedure. LEFT The left flank was prepped with chlorhexidine in a sterile fashion, and a sterile drape was applied covering the operative field. A sterile gown and sterile gloves were used for the procedure. Local anesthesia was provided with 1% Lidocaine. The left flank was interrogated with ultrasound and the left kidney identified. The kidney is hydronephrotic. A suitable access site on the skin overlying the lower pole, posterior calix was identified. After local mg anesthesia was achieved, a small skin nick was made with an 11 blade scalpel. A 21 gauge Accustick needle was then advanced under direct sonographic guidance into the lower pole of the left kidney. A 0.018 inch wire was advanced under fluoroscopic guidance into the left renal collecting system. The Accustick sheath was then advanced over the wire and a 0.018 system exchanged for a 0.035 system. Gentle hand injection of contrast material confirms placement of the sheath within the renal collecting system. There is severe hydronephrosis. The tract from the scan into the renal collecting system was then dilated serially to 10-French. A 10-French Cook all-purpose drain was then placed and positioned under fluoroscopic guidance. The locking loop is well formed within the left renal pelvis. The catheter was secured to the skin with 2-0 Prolene and a sterile bandage was placed. Catheter was left to gravity bag drainage. RIGHT The right flank was prepped with chlorhexidine in a sterile fashion, and a sterile drape was applied covering the operative field. A sterile gown and sterile gloves were used for the procedure. Local anesthesia was provided with 1% Lidocaine. The right flank was interrogated with ultrasound and the left kidney identified. The  kidney is hydronephrotic. There are multiple large cysts overlying the posterior calices. A suitable access site on the skin overlying the lower pole, posterior calix was identified. After local anesthesia was achieved, a small skin nick was made with an 11 blade scalpel. A 21 gauge Accustick needle was then advanced under direct sonographic guidance into the cyst. Aspiration was performed. The cyst was decompressed. Next, the Accustick needle was advanced into the calyx within the lower pole of the right kidney. A 0.018 inch wire was advanced under fluoroscopic guidance into the left renal collecting system. The Accustick sheath was then advanced over the wire and a 0.018 system exchanged for a 0.035 system. Gentle hand injection of contrast material confirms placement of the sheath within the renal collecting system. There is severe hydronephrosis. The tract from the scan  into the renal collecting system was then dilated serially to 10-French. A 10-French Cook all-purpose drain was then placed and positioned under fluoroscopic guidance. The locking loop is well formed within the left renal pelvis. The catheter was secured to the skin with 2-0 Prolene and a sterile bandage was placed. Catheter was left to gravity bag drainage. IMPRESSION: Successful placement of a bilateral 10 French percutaneous nephrostomy tubes. Electronically Signed   By: Malachy Moan M.D.   On: 06/15/2022 17:34   US RENAL  Result Date: 06/15/2022 CLINICAL DATA:  [id number] AKI (acute kidney injury) (HCC) [id number] EXAM: RENAL / URINARY TRACT ULTRASOUND COMPLETE COMPARISON:  CT 07/07/2022 FINDINGS: Right Kidney: Renal measurements: 13.1 x 7.0 x 7.6 cm = volume: 363.7 mL. Persistent moderate hydronephrosis. Left Kidney: Renal measurements: 11.9 x 7.4 x 5.6 cm = volume: 260.1 mL. Persistent moderate hydronephrosis. Bladder: Foley catheter is in place with heterogeneous hyperechoic material adjacent to the Foley balloon, which is avascular.  Posterior bladder wall thickening. Other: None. IMPRESSION: Persistent moderate hydronephrosis, similar to recent CT. Intraluminal echogenic material in the bladder which could represent blood clot, correlate with urinalysis. Posterior bladder wall thickening as seen on recent CT. Electronically Signed   By: Caprice Renshaw M.D.   On: 06/15/2022 12:38   US Venous Img Lower Bilateral (DVT)  Result Date: 06/15/2022 CLINICAL DATA:  Bilateral lower extremity pain and edema, left-greater-than-right. Former smoker. Evaluate for DVT. EXAM: BILATERAL LOWER EXTREMITY VENOUS DOPPLER ULTRASOUND TECHNIQUE: Gray-scale sonography with graded compression, as well as color Doppler and duplex ultrasound were performed to evaluate the lower extremity deep venous systems from the level of the common femoral vein and including the common femoral, femoral, profunda femoral, popliteal and calf veins including the posterior tibial, peroneal and gastrocnemius veins when visible. The superficial great saphenous vein was also interrogated. Spectral Doppler was utilized to evaluate flow at rest and with distal augmentation maneuvers in the common femoral, femoral and popliteal veins. COMPARISON:  CT abdomen and pelvis-06/23/2022 FINDINGS: Examination is degraded due to patient body habitus and poor sonographic window RIGHT LOWER EXTREMITY Common Femoral Vein: No evidence of thrombus. Normal compressibility, respiratory phasicity and response to augmentation. Saphenofemoral Junction: No evidence of thrombus. Normal compressibility and flow on color Doppler imaging. Profunda Femoral Vein: No evidence of thrombus. Normal compressibility and flow on color Doppler imaging. Femoral Vein: No evidence of thrombus. Normal compressibility, respiratory phasicity and response to augmentation. Popliteal Vein: No evidence of thrombus. Normal compressibility, respiratory phasicity and response to augmentation. Calf Veins: No evidence of thrombus. Normal  compressibility and flow on color Doppler imaging. Superficial Great Saphenous Vein: No evidence of thrombus. Normal compressibility. Other Findings:  None. LEFT LOWER EXTREMITY Common Femoral Vein: No evidence of thrombus. Normal compressibility, respiratory phasicity and response to augmentation. Saphenofemoral Junction: No evidence of thrombus. Normal compressibility and flow on color Doppler imaging. Profunda Femoral Vein: No evidence of thrombus. Normal compressibility and flow on color Doppler imaging. Femoral Vein: No evidence of thrombus. Normal compressibility, respiratory phasicity and response to augmentation. Popliteal Vein: No evidence of thrombus. Normal compressibility, respiratory phasicity and response to augmentation. Calf Veins: No evidence of thrombus. Normal compressibility and flow on color Doppler imaging. Superficial Great Saphenous Vein: No evidence of thrombus. Normal compressibility. Other Findings: Pathologically enlarged left inguinal lymph nodes as demonstrated on preceding abdominal CT with index inguinal lymph node measuring 1.3 cm in greatest short axis diameter (image 49). There is a moderate amount of subcutaneous edema at the level of the left  lower leg and calf. IMPRESSION: 1. No evidence of DVT within the left lower extremity. 2. Pathologically enlarged left inguinal lymph nodes, similar to preceding abdominal CT performed 06/11/2022. Electronically Signed   By: Simonne Come M.D.   On: 06/15/2022 10:59   DG Chest Port 1V same Day  Result Date: 06/15/2022 CLINICAL DATA:  Dyspnea EXAM: PORTABLE CHEST 1 VIEW COMPARISON:  08/15/2019, CT 06/05/2022 FINDINGS: Stable coarsening of the pulmonary interstitium. The lungs are symmetrically well inflated. Known spiculated pulmonary nodule within the peripheral right upper lobe seen on prior CT examination is not well appreciated on this exam. New retrocardiac opacity in keeping with a focal pulmonary infiltrate within this region,  possibly infectious in the acute setting. No pneumothorax or pleural effusion. Cardiac size within normal limits. Central pulmonary vascular congestion without overt pulmonary edema. IMPRESSION: 1. New retrocardiac pulmonary infiltrate, possibly infectious in the acute setting. 2. Central pulmonary vascular congestion without overt pulmonary edema. 3. Known right upper lobe spiculated pulmonary nodule not well appreciated on this exam. Electronically Signed   By: Helyn Numbers M.D.   On: 06/15/2022 00:48   CT ABDOMEN PELVIS WO CONTRAST  Result Date: 07/02/2022 CLINICAL DATA:  Abdominal pain, nausea and diarrhea, history of colon cancer EXAM: CT ABDOMEN AND PELVIS WITHOUT CONTRAST TECHNIQUE: Multidetector CT imaging of the abdomen and pelvis was performed following the standard protocol without IV contrast. Unenhanced CT was performed per clinician order. Lack of IV contrast limits sensitivity and specificity, especially for evaluation of abdominal/pelvic solid viscera. RADIATION DOSE REDUCTION: This exam was performed according to the departmental dose-optimization program which includes automated exposure control, adjustment of the mA and/or kV according to patient size and/or use of iterative reconstruction technique. COMPARISON:  05/16/2022 FINDINGS: Lower chest: New consolidation within the posterior left lower lobe at the costophrenic angle, which could be atelectasis or acute airspace disease. No pleural effusion. Hepatobiliary: Unremarkable unenhanced appearance of the liver. The gallbladder is moderately distended, without evidence of cholelithiasis or cholecystitis. Pancreas: Unremarkable unenhanced appearance. Spleen: Unremarkable unenhanced appearance. Adrenals/Urinary Tract: Bilateral hydronephrosis is again identified, as seen on recent chest CT. There is also bilateral hydroureter to the level of the base of the bladder. There is marked posterior bladder wall thickening, measuring up to 14 mm. High  attenuation material seen within the bladder surrounding the Foley catheter balloon consistent with blood products. Differential diagnosis include cystitis, chronic scarring from previous radiation therapy in a patient with a history of colon cancer, or bladder neoplasm. Urology consultation recommended. There are bilateral nonobstructing renal calculi, measuring 5 mm on the left and 3 mm on the right. Simple appearing right renal cysts do not require specific imaging follow-up. The adrenals are unremarkable. Stomach/Bowel: Postsurgical changes are seen from distal colectomy and reanastomosis. There is segmental wall thickening of the descending colon extending to the level of the anastomosis, compatible with inflammatory or infectious colitis. Moderate distension of the colon with retained gas and stool. No evidence of small-bowel obstruction. Vascular/Lymphatic: Lymphadenopathy has developed within the bilateral inguinal regions and retroperitoneum. Index lymph node in the left inguinal region reference image 76/2 measures 15 mm in short axis. Left para-aortic adenopathy measures up to 14 mm in short axis reference image 43/2. There is extensive atherosclerosis throughout the aorta and its distal branches. Evaluation of the vascular lumen is limited without IV contrast. Reproductive: The prostate is enlarged, measuring 5.7 x 4.8 cm in cross-sectional diameter. Other: There is diffuse fat stranding in the perirectal region, surrounding the prostate,  and adjacent to the wall thickening at the base of the bladder. This is nonspecific, and could be related to prior radiation therapy. There is no free intraperitoneal fluid or free intraperitoneal gas. There are fat containing midline ventral hernias. The periumbilical fat containing ventral hernia on image 53 demonstrates stranding within the herniated fat, could reflect incarceration. No bowel herniation. There is also evidence of prior ventral hernia repair.  Musculoskeletal: No acute or destructive bony lesions. Stable severe right hip osteoarthritis. Reconstructed images demonstrate no additional findings. IMPRESSION: 1. Marked wall thickening of the posterior aspect of the bladder, measuring up to 14 mm. This results in severe bilateral hydroureteronephrosis. Differential diagnosis includes neoplasm, post radiation change, or cystitis. High attenuation material surrounding the Foley catheter within the bladder lumen consistent with blood products. Urology consultation and cystoscopy recommended. 2. Segmental wall thickening of the distal colon, extending from the splenic flexure through the rectosigmoid anastomosis, suspicious for underlying inflammatory or infectious colitis. 3. New retroperitoneal and bilateral inguinal lymphadenopathy. Metastatic disease not excluded. 4. New dependent consolidation within the left lower lobe posterior costophrenic angle, consistent with pneumonia or atelectasis. 5. Fat containing midline ventral hernias. The inferior ventral hernia demonstrates stranding within the herniated fat, incarcerated hernia not excluded. No bowel herniation. 6. Enlarged prostate. 7. Nonspecific fat stranding within the lower pelvis surrounding the rectosigmoid anastomosis, prostate, and bladder base. This could be sequela of prior radiation therapy. 8.  Aortic Atherosclerosis (ICD10-I70.0). 9. Bilateral nonobstructing renal calculi. Electronically Signed   By: Sharlet Salina M.D.   On: 06/25/2022 17:06   CT Chest Wo Contrast  Result Date: 06/09/2022 CLINICAL DATA:  History of colon cancer. Status post chemotherapy and radiation. Lung nodules. EXAM: CT CHEST WITHOUT CONTRAST TECHNIQUE: Multidetector CT imaging of the chest was performed following the standard protocol without IV contrast. RADIATION DOSE REDUCTION: This exam was performed according to the departmental dose-optimization program which includes automated exposure control, adjustment of the  mA and/or kV according to patient size and/or use of iterative reconstruction technique. COMPARISON:  CT 09/26/2021 and older FINDINGS: Cardiovascular: Heart is nonenlarged. No pericardial effusion. Coronary artery calcifications are seen. The thoracic aorta has some scattered vascular calcifications. Tortuous course of the descending thoracic aorta. Mediastinum/Nodes: Small hiatal hernia. Normal caliber thoracic esophagus. The esophagus is slightly patulous. Heterogeneous thyroid gland. No specific abnormal lymph node enlargement identified in the axillary region or hilum on this noncontrast examination. There are some small mediastinal nodes, nonpathologic by size criteria. This includes retrocrural. Example series 2, image 132 right retrocrural measures 12 x 9 mm today and previously 12 x 8 mm. Lungs/Pleura: Breathing motion. Dependent atelectasis. No consolidation, pneumothorax or effusion. Upper lung zone centrilobular emphysematous lung changes are identified. Apical pleural thickening on the left-greater-than-right. Spiculated semi-solid nodule seen in the right upper lobe on the prior that measured 16 mm overall, today on series 2, image 62 again measures 16 by 10 mm, unchanged when measured in the same fashion. The solid component more superior the was measured at 9 mm is stable. On the study of December 2021, the lesion is remeasured in the same fashion as today and at that time would have measured 15 x 9 mm. Very similar. Left upper lobe 5 mm nodule at the apex is stable today on series 2, image 32. This lesion has been stable since December 2021. Upper Abdomen: In the upper abdomen the adrenal glands are diffusely thickened and nodular. New collecting system dilatation of the kidneys. Etiology is uncertain. Musculoskeletal: Diffuse  degenerative changes seen along the spine. IMPRESSION: Bilateral lung nodule seen previously are similar today going back to a study of December 2021. Long-term stability. No  specific additional imaging follow-up of the lung nodules. Underlying emphysematous lung changes. However there is new collecting system dilatation of the kidneys at the edge of the imaging field of uncertain etiology. Recommend dedicated abdominal imaging to confirm etiology and correlate with known history. Findings will be called to the ordering service by the Radiology physician assistant team Aortic Atherosclerosis (ICD10-I70.0) and Emphysema (ICD10-J43.9). Electronically Signed   By: Karen Kays M.D.   On: 06/09/2022 15:15    Standley Dakins, MD   [facility]  Critical Care Procedure Note Authorized and Performed by: Maryln Manuel MD  Total Critical Care time:  70 mins Due to a high probability of clinically significant, life threatening deterioration, the patient required my highest level of preparedness to intervene emergently and I personally spent this critical care time directly and personally managing the patient.  This critical care time included obtaining a history; examining the patient, pulse oximetry; ordering and review of studies; arranging urgent treatment with development of a management plan; evaluation of patient's response of treatment; frequent reassessment; and discussions with other providers.  This critical care time was performed to assess and manage the high probability of imminent and life threatening deterioration that could result in multi-organ failure.  It was exclusive of separately billable procedures and treating other patients and teaching time.     If 7PM-7AM, please contact night-coverage [email or web address] Password [email or web address] 06/20/2022, 11:19 AM   LOS: 6 days

## 2022-06-20 NOTE — Consult Note (Addendum)
Consultation  Referring Provider:   CCM Primary Care Physician:  Verlee Monte, NP Primary Gastroenterologist:   Dr. Marletta Lor at [facility]       Reason for Consultation:  Hematemesis with shock           HPI:   [name] is a 87 y.o. male with past medical history significant for coronary artery disease, hypertension, COPD with chronic respiratory failure on 2 L at night, history of colorectal adenocarcinoma 2011 status post partial colectomy and chemoradiation with Xeloda, history of nephro lithiasis, presented to the ER with urinary incontinence, nausea, abdominal pain, distention and loose stools.  Patient is currently intubated due to loss of airway, the majority of the history from the chart. Patient was found to have AKI bilateral hydronephrosis, colitis on CT at [facility].   Patient was seen by GI there.  Was complaining of frequent stools for several weeks, scant bright red blood at times, associated abdominal pain chills.   Negative C. difficile, GI pathogen negatve.   Last colonoscopy unremarkable 2015.   Nephrology and urology also consulted, bilateral nephrostomy tube placement on 05/06, had some bloody output from nephrostomy tubes..   Some concern for bladder neoplasm. In the ER creatinine was 7, potassium 5.6, BUN 105, hemoglobin 12.3, WBC 15.6.  Lactic acid at the time 1.0.   UTI concerning for infection, culture multiple organisms. CT abdomen pelvis without contrast showed marked wall thickening posterior aspect bladder severe bilateral hydronephrosis cirrhosis, segmental wall thickening distal colon extending from splenic flexure through rectosigmoid anastomosis, retroperitoneal and bilateral inguinal lymph adenopathy and left lower lobe pneumonia. Blood culture positive for GRAM POSITIVE COCCI, AEROCOCCUS VIRIDANS but no growth on the other.  Patient has been on ceftriaxone and metronidazole for potential pneumonia/colitis.  Overnight on the 7th patient  had some bloody output from nephrostomy tubes placed, hemoglobin dropped from 12.3 on admission to 9.9  This morning patient had large-volume coffee-ground emesis and hematemesis and became unresponsive, CPR, epi.   There is an order for blood however per CCM this was never given.  Currently hemoglobin at 10.4, WBC 18.7, platelets 494.  Lactic acid 6.1.  Patient intubated and transferred to [facility] for further evaluation. Patient's family in the room at bedside.  Reports he had had some dysphagia prior to foods, no significant reflux was not on PPI.   Never had endoscopy prior to this. Patient currently has less than 100 cc dark red blood from ET tube otherwise no large-volume bleeding here. Blood pressure on the room was 82/58 and Levophed was increased from 20-26.  Currently is not on neo-, considering central line. CCM having conversation with the family in regards to goals of care. Denies NSAID use, alcohol use. Longstanding history of tobacco use 171 pack year history quit 18 years ago.    Past Medical History:  Diagnosis Date   Arthritis    knees, hips   CAD (coronary artery disease)    Abnormal stress perfusion study with apical and inferior wall ischemia. LAD had a long 99% stenosis followed by 30% stenosis, first diagonal 25% stenosis, circumflex luminal irregularities, second obtuse marginal 25% stenosis, RCA 40% followed by 40% stenosis. EF 60%. Stenting with 2 bare metal stents by Dr. Excell Seltzer to the LAD.   Colon cancer [facility])    Radiation, chemo, surgery 2011   Complication of anesthesia    Pt states he coughs alot during anesthesia   COPD (chronic obstructive pulmonary disease) (  HCC)    Dyspnea    when walking, knee pain    Dysrhythmia    History of kidney stones    passed x1   History of tobacco use    HOH (hard of hearing)    Hypertension    Pneumonia    many times as a child    Surgical History:  He  has a past surgical history that includes Umbilical hernia  repair (01/2008); Hemicolectomy (01/10/2010); Portacath placement (2011); portacath removal (2011); Inguinal hernia repair (2012); Coronary angioplasty with stent (2008); Cataract extraction w/PHACO (Left, 05/29/2016); Cataract extraction w/PHACO (Right, 06/12/2016); ABDOMINAL AORTOGRAM W/LOWER EXTREMITY (N/A, 01/09/2019); Video bronchoscopy with endobronchial navigation (N/A, 08/15/2019); Bronchial needle aspiration biopsy (08/15/2019); Bronchial biopsy (08/15/2019); Bronchial brushings (08/15/2019); Bronchial washings (08/15/2019); IR NEPHROSTOMY PLACEMENT LEFT (06/15/2022); and IR NEPHROSTOMY PLACEMENT RIGHT (06/15/2022). Family History:  His family history is not on file. He was adopted. Social History:   reports that he quit smoking about 18 years ago. His smoking use included cigarettes. He has a 171.00 pack-year smoking history. He has been exposed to tobacco smoke. He has never used smokeless tobacco. He reports that he does not drink alcohol and does not use drugs.  Prior to Admission medications   Medication Sig Start Date End Date Taking? Authorizing Provider  acetaminophen (TYLENOL) 500 MG tablet Take 1,000 mg by mouth every 6 (six) hours as needed.   Yes [provider]  albuterol (VENTOLIN HFA) 108 (90 Base) MCG/ACT inhaler Inhale 2 puffs into the lungs every 6 (six) hours as needed for wheezing or shortness of breath. Patient taking differently: Inhale 1 puff into the lungs every 6 (six) hours as needed for wheezing or shortness of breath. 08/29/21  Yes Cobb, Ruby Cola, NP  amLODipine-benazepril (LOTREL) 10-40 MG capsule Take 1 capsule by mouth daily.   Yes [provider]  aspirin 81 MG chewable tablet Chew 81 mg by mouth every evening.   Yes [provider]  atorvastatin (LIPITOR) 20 MG tablet Take 1 tablet (20 mg total) by mouth every evening. 11/22/18  Yes Glade Lloyd, MD  Cholecalciferol (VITAMIN D3) 50 MCG (2000 UT) TABS Take 2,000 Units by mouth daily.   Yes [provider]  Cinnamon 500 MG TABS Take 500 mg by mouth 2 (two) times daily.   Yes [provider]  Cyanocobalamin (VITAMIN B12 PO) Use as directed 1 tablet in the mouth or throat daily.   Yes [provider]  Emollient (CERAVE) CREA Apply 1 application topically 2 (two) times daily.   Yes [provider]  ferrous sulfate 325 (65 FE) MG tablet Take 325 mg by mouth daily with breakfast.   Yes [provider]  Fluticasone-Umeclidin-Vilant (TRELEGY ELLIPTA) 200-62.5-25 MCG/ACT AEPB Inhale 1 puff into the lungs daily. 08/29/21  Yes Cobb, Ruby Cola, NP  hydrochlorothiazide (HYDRODIURIL) 25 MG tablet Take 25 mg by mouth daily.   Yes [provider]  loperamide (IMODIUM A-D) 2 MG tablet Take 2 mg by mouth 4 (four) times daily as needed for diarrhea or loose stools.   Yes [provider]  Magnesium 250 MG TABS Take 250 mg by mouth daily.    Yes [provider]  metoprolol tartrate (LOPRESSOR) 100 MG tablet Take 1 tablet (100 mg total) by mouth at bedtime. 11/22/18  Yes Glade Lloyd, MD  Multiple Vitamins-Minerals (MENS MULTIPLE VITAMIN/LYCOPENE PO) Take 1 tablet by mouth daily.   Yes [provider]  nitroGLYCERIN (NITROSTAT) 0.4 MG SL tablet Place 1  tablet (0.4 mg total) under the tongue every 5 (five) minutes as needed. 12/27/12  Yes Rollene Rotunda, MD  OVER THE COUNTER MEDICATION Take 1 tablet by mouth 2 (two) times daily. Fish, flax and borage oil   Yes [provider]  Turmeric (QC TUMERIC COMPLEX PO) Take by mouth 2 (two) times daily.   Yes [provider]  zinc gluconate 50 MG tablet Take 50 mg by mouth daily.   Yes [provider]    Current Facility-Administered Medications  Medication Dose Route Frequency Provider Last Rate Last Admin   0.9 %  sodium chloride infusion (Manually program via Guardrails IV Fluids)   Intravenous Once Johnson, Clanford L, MD       0.9 %  sodium chloride infusion   250 mL Intravenous Continuous Johnson, Clanford L, MD       0.9 %  sodium chloride infusion  250 mL Intravenous Continuous Johnson, Clanford L, MD       acetaminophen (TYLENOL) tablet 650 mg  650 mg Oral Q6H PRN Meredeth Ide, MD       Or   acetaminophen (TYLENOL) suppository 650 mg  650 mg Rectal Q6H PRN Sharl Ma, Sarina Ill, MD       alum & mag hydroxide-simeth (MAALOX/MYLANTA) 200-200-20 MG/5ML suspension 15 mL  15 mL Oral Q4H PRN Johnson, Clanford L, MD       cefTRIAXone (ROCEPHIN) 2 g in sodium chloride 0.9 % 100 mL IVPB  2 g Intravenous Q24H Johnson, Clanford L, MD       Chlorhexidine Gluconate Cloth 2 % PADS 6 each  6 each Topical Daily Tat, David, MD   6 each at 06/20/22 0859   dexmedetomidine (PRECEDEX) 400 MCG/100ML (4 mcg/mL) infusion  0-1.2 mcg/kg/hr Intravenous Continuous Johnson, Clanford L, MD 11.34 mL/hr at 06/20/22 1117 0.4 mcg/kg/hr at 06/20/22 1117   docusate (COLACE) 50 MG/5ML liquid 100 mg  100 mg Per Tube BID Johnson, Clanford L, MD       etomidate (AMIDATE) 2 MG/ML injection            fentaNYL (SUBLIMAZE) 50 MCG/ML injection            fentaNYL (SUBLIMAZE) injection 25 mcg  25 mcg Intravenous Q15 min PRN Johnson, Clanford L, MD       fentaNYL (SUBLIMAZE) injection 25-100 mcg  25-100 mcg Intravenous Q30 min PRN Laural Benes, Clanford L, MD       Gerhardt's butt cream   Topical BID Tat, David, MD   Given at 06/20/22 0902   heparin injection 5,000 Units  5,000 Units Subcutaneous Q12H Meredeth Ide, MD   5,000 Units at 06/20/22 0847   ipratropium-albuterol (DUONEB) 0.5-2.5 (3) MG/3ML nebulizer solution 3 mL  3 mL Nebulization Q6H PRN Johnson, Clanford L, MD       lactated ringers bolus 1,000 mL  1,000 mL Intravenous Once Johnson, Clanford L, MD       metroNIDAZOLE (FLAGYL) IVPB 500 mg  500 mg Intravenous Q12H Johnson, Clanford L, MD 100 mL/hr at 06/20/22 0902 500 mg at 06/20/22 0902   norepinephrine (LEVOPHED) 4mg  in (0.016 mg/mL) premix infusion  2-10 mcg/min Intravenous Titrated  Johnson, Clanford L, MD 33.8 mL/hr at 06/20/22 1117 9 mcg/min at 06/20/22 1117   ondansetron (ZOFRAN) tablet 4 mg  4 mg Oral Q6H PRN Meredeth Ide, MD       Or   ondansetron (ZOFRAN) injection 4 mg  4 mg Intravenous Q6H PRN Meredeth Ide, MD  4 mg at 06/20/22 0424   Oral care mouth rinse  15 mL Mouth Rinse PRN Johnson, Clanford L, MD       pantoprazole (PROTONIX) injection 40 mg  40 mg Intravenous Q12H Johnson, Clanford L, MD       phenylephrine (NEO-SYNEPHRINE) 20mg /NS premix infusion  25-200 mcg/min Intravenous Titrated Johnson, Clanford L, MD       [START ON 06/21/2022] polyethylene glycol (MIRALAX / GLYCOLAX) packet 17 g  17 g Per Tube Daily Johnson, Clanford L, MD       prochlorperazine (COMPAZINE) injection 10 mg  10 mg Intravenous Q6H PRN Zierle-Ghosh, Asia B, DO   10 mg at 06/20/22 0641   succinylcholine (ANECTINE) 200 MG/10ML syringe             Allergies as of 06/23/2022 - Review Complete 07/08/2022  Allergen Reaction Noted   Other  08/14/2019   Sulfa antibiotics  05/23/2014    Review of Systems:    Constitutional: No weight loss, fever, chills, weakness or fatigue HEENT: Eyes: No change in vision               Ears, Nose, Throat:  No change in hearing or congestion Skin: No rash or itching Cardiovascular: No chest pain, chest pressure or palpitations   Respiratory: No SOB or cough Gastrointestinal: See HPI and otherwise negative Genitourinary: No dysuria or change in urinary frequency Neurological: No headache, dizziness or syncope Musculoskeletal: No new muscle or joint pain Hematologic: No bleeding or bruising Psychiatric: No history of depression or anxiety     Physical Exam:  Vital signs in last 24 hours: Temp:  [97.7 F (36.5 C)-98.4 F (36.9 C)] 98.4 F (36.9 C) (05/11 1315) Pulse Rate:  [56-124] 107 (05/11 1315) Resp:  [15-40] 20 (05/11 1315) BP: (58-115)/(36-84) 92/57 (05/11 1315) SpO2:  [93 %-100 %] 93 % (05/11 1315) FiO2 (%):  [100 %] 100 % (05/11  1304) Last BM Date : 06/20/22 Last BM recorded by nurses in past 5 days Stool Type: Type 7 (Liquid consistency with no solid pieces) (06/18/2022  1:00 PM)  General: Chronically ill-appearing obese male, intubated dried blood around lips/mouth, ET tube less than 100 cc blood.  Eyes: Pupils midsize, nonreactive. Heart: Distant heart sounds, regular rhythm, tachycardic Pulm: Coarse breath sounds anteriorly, ventilator sounds. Abdomen:  Soft, Obese AB, sluggish but active bowel sounds.  Unknown tenderness as patient is nonresponsive. Extremities: Left leg 2+ edema, stasis dermatitis, right leg mild edema, stasis dermatitis, peripheral pulses intact. Neurologic: Currently intubated, nonresponsive. Skin: Bilateral ecchymoses.  LAB RESULTS: Recent Labs    06/18/22 0431 06/19/22 0421 06/20/22 0954  WBC 10.4 11.5* 18.7*  HGB 8.7* 9.7* 10.4*  HCT 27.1* 30.7* 34.1*  PLT 366 406* 494*   BMET Recent Labs    06/19/22 0421 06/20/22 0409 06/20/22 0954  NA 141 142 141  K 3.4* 3.1* 3.2*  CL 106 105 101  CO2 23 25 23   GLUCOSE 117* 127* 238*  BUN 65* 64* 69*  CREATININE 4.08* 3.75* 4.07*  CALCIUM 8.1* 8.5* 8.8*   LFT Recent Labs    06/20/22 0954  PROT 7.1  ALBUMIN 2.9*  AST 27  ALT 16  ALKPHOS 54  BILITOT 0.7   PT/INR No results for input(s): "LABPROT", "INR" in the last 72 hours.  STUDIES: DG CHEST PORT 1 VIEW  Result Date: 06/20/2022 CLINICAL DATA:  Rapid response, intubation the tube placement. EXAM: PORTABLE CHEST 1 VIEW COMPARISON:  Chest radiograph 06/15/2022; CT chest 06/05/2022 FINDINGS:  Endotracheal tube tip projects 6 cm above the carina. Enteric tube tip projects just below the level of the diaphragm, near the expected location of the gastroesophageal junction. Partially visualized left nephrostomy tube. Cardiac pads overlie the midline and lower left chest wall. Stable enlarged cardiac silhouette. Mild interstitial thickening at the lung bases, right-greater-than-left.  Hazy airspace opacity in the medial right lung base, new since 06/15/2022. Subtle focal opacity in the peripheral right upper lobe is favored to correspond with the spiculated nodule noted on CT 06/05/22. No pleural effusion or pneumothorax. Mildly displaced lateral right fourth and fifth rib fractures. IMPRESSION: 1. Endotracheal tube tip projects 6 cm above the carina. 2. Enteric tube tip projects just below the level of the diaphragm, near the expected location of the gastroesophageal junction. 3. Hazy airspace opacity in the medial right lung base. This could represent atelectasis, aspiration changes or possibly infection in the appropriate clinical context. 4. Mildly displaced lateral right fourth and fifth rib fractures. Electronically Signed   By: Sherron Ales M.D.   On: 06/20/2022 11:24      Impression    Coffee-ground emesis/hematemesis Intubated for ER for airway protection Hemoglobin 10.4, pending repeat, never received PRBC Per family, no alcohol no NSAIDs, no previous endoscopy.  Possibly some dysphagia.  No evidence of cirrhosis or stomach/esophageal thickening on CT. Uncertain etiology, uncertain of this is contributing to current shock, pending updated CBC.  Possibly peptic ulcer disease in setting of acute stress, question ischemic event, AVMs  Shock with cardiac arrest s/p CPR Lactic acid 6.1, WBC 18.7, currently intubated Requiring 26 mics levo, BP still 82/50 Too unstable for endoscopic procedure  Bladder wall thickening with likely mass concerning for malignancy with bilateral hydronephrosis and AKI, and lymphadenopathy seen on CT noncontrast.  status post bilateral nephrostomy tubes but also had some bleeding.  History of rectal cancer status post partial colectomy Previous colonoscopy 2015 unremarkable  Colitis seen on CT Negative C. difficile/GI pathogen panel No reports of rectal bleeding Question ischemic  Left lower lobe pneumonia On ceftriaxone Currently  intubated.    LOS: 6 days     Plan   Currently patient is too unstable for endoscopic evaluation. Continue to monitor CBC, transfuse to keep greater than 8. Protonix infusion for possible stress gastritis/peptic ulcer disease. Very guarded prognosis, consider goals of care discussion and palliative care consult. Please contact GI if patient becomes more stable for endoscopic evaluation.  Thank you for your kind consultation, we will continue to follow.   Doree Albee  06/20/2022, 1:18 PM   Attending Physician Note   I have taken a history, reviewed the chart and examined the patient. I performed more than 50% of this encounter in conjunction with the APP. I agree with the APP's note, impression and recommendations with my edits. My additional impressions and recommendations are as follows.   87 year old male with coffee ground emesis, shock, cardiac arrest with CPR, bladder wall thickening with bilateral hydronephrosis concerning for malignancy, recent colitis-etiology unclear, history of rectal cancer - S/P partial colectomy and LLL PNA. He is critically ill, intubated and hypotensive on pressors. Current OG output is dark and low volume. R/O ulcer, gastritis, MW tear leading to hematemesis.  IV pantoprazole bolus and infusion Monitor CBC, transfusion to maintain Hgb > 8 He is too unstable for EGD at this time  CCM and family having GOC discussions. Family may not want additional interventions.  GI will follow.    Claudette Head, MD [facility] See  [facility], for our on call provider

## 2022-06-20 NOTE — Progress Notes (Signed)
Patient is receiving vasopressors and need USPIV access. Talked to patient's RN regarding this matter. He said that they are planning to put in the CVC. PA is talking with patient's family at the bedside. If patient need a USPIV, patient's RN will put in the consult. HS [employer]

## 2022-06-20 NOTE — Progress Notes (Signed)
CODE NOTE AS FOLLOWS:   Patient rapid response was called at 0930, patient loss pulse at 934 and code blue was called and CPR initiated with back board in place. Physician was already at bedside for the rapid response. Suctioning was at bedside, with bloody gastric contents being suctioned via mouth. Patient pads were placed for CPR. At pulse check at 936, patient had a junctional rhythm noted on the crash code monitor. CPR stopped. Epi given at 937. Patient was being bagged for oxygenation, sats 70% on the dinamap. Bolus was started at 942. At 946 new vitals were obtained, sats 74%, hr of 56, and bp 131/94. Etomidate given at 950 just prior to intubation. Succinylchine given at 951. Both given under instruction of physician running the code/intubation. Suctioning continued , 953 intubation balloon inflated, 22@ the lip. Patient also had NG tube placed. Vitals rechecked at 957, HR 97, bp 91/53, o2 at 90%. Patient transported to ICU room 10.

## 2022-06-20 NOTE — Code Documentation (Signed)
CODE BLUE NOTE  Patient Name: [name]   MRN: [id number]   Date of Birth/ Sex: April 29, 1935 , male      Admission Date: 06/16/2022  Attending Provider: Cleora Fleet, MD  Primary Diagnosis: AKI (acute kidney injury) [facility])   Pt admitted under hospitalist service for primary Dx AKI, note from this AM reported vomiting +occult blood. I was on the unit when code blue called, confirmed full code status as documented. Pt appeared to have just had aspiration event of hematemesis, dark red vomitus apparent. We confirmed no palpable pulse or spontaneous respirations, promptly initiated high quality CPR and bagging. Some ongoing vomitus. After 2 minutes and pads placed, rhythm shows junctional bradycardia with palpable pulse, epinephrine x1 given. Plan to intubate. Primary attending to bedside. Anticipate transfer to ICU, intubation. Labs ordered. Dr. Laural Benes to continue resuscitation efforts.     Tyrone Nine, MD  06/20/2022, 9:41 AM

## 2022-06-20 NOTE — Progress Notes (Signed)
This nurse and two techs were cleaning patient up from vomiting and a small bowel movement. Once patient was cleaned up and sitting up in the bed, patient went unresponsive. Code blue was called and CPR was started.

## 2022-06-20 NOTE — Progress Notes (Signed)
Pt c/o heartburn, one time dose of Tums given. Pt vomited during the night, PRN Zofran given with no relief. Vitals stable. Pt vomited multiple times after Zofran administration. Compazine given per order. Emesis occult test positive. MD notified.

## 2022-06-20 NOTE — IPAL (Signed)
  Interdisciplinary Goals of Care Family Meeting   Date carried out: 06/20/2022  Location of the meeting: Bedside  Member's involved: Physician, Nurse Practitioner, Bedside Registered Nurse, and Family Member or next of kin  Durable Power of Attorney or acting medical decision maker: Sheared unknown spouse and daughter  Discussion: On presentation to ICU extensive goals of care discussion held with patient's spouse and daughter.  They were informed of critical illness including cardiac arrest.  Family was initially unaware of arrest.  Family understands that patient is critically ill with high potential for further decompensation.  This is in the setting of likely undiagnosed/untreated or metastatic cancer.  Family has decided to continue current critical interventions including ventilator, aggressive antibiotics, stress dose steroids, and peripheral pressors.  At this time no escalation in care including any further up titrations of peripheral pressors or insertion of central access.   Family ultimately wishes to allow patient other daughter time to arrive to the hospital before decision of comfort care was made.  They are aware that patient may clinically decompensate prior to her arrival.  Code status: Full DNR  Disposition: Continue current acute care   Time spent for the meeting: 30 min   Maribeth Jiles D. Harris 06/20/2022, 2:56 PM

## 2022-06-20 NOTE — Progress Notes (Addendum)
Pharmacy Antibiotic Note  [name] is a 87 y.o. male admitted on 06/17/2022 with sepsis.  Pharmacy has been consulted for vancomycin and Zosyn dosing.   Patient is s/p arrest on 5/11. CrCl is 17.8 ml/min. Lactate is 6.1. WBC is 18.7. Recent aerococcus viridans Bcx on 5/5. Presented with abdominal and urinary symptoms.   Plan: Give vancomycin 1750 mg load Further doses per levels given unstable renal function. Check vancomycin random in ~48 hours Zosyn 2.25 g every 8 hours Stop Flagyl  F/u cultures and clinical progress to deescalate Check MRSA PCR  Height: 6\' 2"  (188 cm) Weight: 113.4 kg (250 lb) IBW/kg (Calculated) : 82.2  Temp (24hrs), Avg:98.1 F (36.7 C), Min:97.7 F (36.5 C), Max:98.4 F (36.9 C)  Recent Labs  Lab 07/06/2022 1459 07/10/2022 1846 06/16/22 0412 06/17/22 0410 06/18/22 0431 06/19/22 0421 06/20/22 0409 06/20/22 0954  WBC 15.6*   < > 13.1* 11.8* 10.4 11.5*  --  18.7*  CREATININE 7.04*   < > 6.77* 5.91* 4.96* 4.08* 3.75* 4.07*  LATICACIDVEN 1.0  --   --   --   --   --   --  6.4*   < > = values in this interval not displayed.    Estimated Creatinine Clearance: 17.8 mL/min (A) (by C-G formula based on SCr of 4.07 mg/dL (H)).    Allergies  Allergen Reactions   Other     General anesthesia - cough    Sulfa Antibiotics     Unknown reaction - we had listed nausea     Antimicrobials this admission: CTX 5/5 >> 5/11 Flagyl 5/5 >> 5/11 Vanc 5/11 >> Zosyn 5/11 >>    Microbiology results: 5/5 BCx: 1/2 aerococcus viridans   Thank you for allowing pharmacy to be a part of this patient's care.  Marygrace Drought 06/20/2022 2:50 PM

## 2022-06-20 NOTE — Progress Notes (Signed)
eLink Physician-Brief Progress Note Patient Name: [name] DOB: February 07, 1936 MRN: [id number]   Date of Service  06/20/2022  HPI/Events of Note  Received request to order norepinephrine to go above the maximum dose allowed to run peripherally. Central line not being inserted as family is likely going to decide on CMO in the morning.  eICU Interventions  Will give a trial of albumin infusion for now and hopefully facilitate titrating down norepinephrine     Intervention Category Intermediate Interventions: Hypotension - evaluation and management  Darl Pikes 06/20/2022, 10:42 PM

## 2022-06-20 NOTE — H&P (Signed)
NAME:  [name], [name], [name], [name] ADMISSION DATE:  07/02/2022, CONSULTATION DATE:  06/20/2022 REFERRING MD: Dr. Laural Benes - [facility]  CHIEF COMPLAINT:  Hemmorrhagic shock,  History of Present Illness:  [name] is a 87 y.o. with a past medical history significant for CAD, HTN, chronic respiratory failure on 2 L at bedtime, COPD, history of colorectal adenocarcinoma 2011 s/p partial colectomy, and arthritis who presented to the emergency department at [facility] 5/2 for complaints of abdominal discomfort and distention with difficulty urinating.  Workup on ED admission with ABD revealed bilateral hydronephrosis, posterior bladder wall thickening with wall thickening at the descending colon as well.  Early morning of 5/11 patient had episode of hematic emesis with signs of hemorrhagic shock resulting in cardiac arrest.  Patient was transferred to ICU at [facility] for further management.  Pertinent  Medical History  CAD, HTN, chronic respiratory failure on 2 L at bedtime, COPD, history of colorectal adenocarcinoma 2011 s/p partial colectomy, and arthritis   Significant Hospital Events: Including procedures, antibiotic start and stop dates in addition to other pertinent events   5/5 presented with abdominal discomfort and difficulty urinating 5/[name] bilateral nephrostomy tubes placed per IR 5/11 suffered 16-minute cardiac arrest felt respiratory driven with multiple episodes of vomiting overnight and this a.m.  Interim History / Subjective:  As above   Objective   Blood pressure (!) 94/55, pulse (!) 102, temperature 97.7 F (36.5 C), temperature source Axillary, resp. rate (!) 26, height [name]\' 2"  (1.88 m), weight 113.4 kg, SpO2 97 %.    Vent Mode: PRVC FiO2 (%):  [100 %] 100 % Set Rate:  [20 bmp] 20 bmp Vt Set:  [660 mL] 660 mL PEEP:  [5 cmH20] 5 cmH20 Plateau Pressure:  [28 cmH20] 28 cmH20   Intake/Output Summary (Last 24 hours) at 06/20/2022 1306 Last data filed at  06/20/2022 1117 Gross per 24 hour  Intake 388.67 ml  Output 1190 ml  Net -801.33 ml   Filed Weights    1425  Weight: 113.4 kg    Examination: General: Acute on chronic ill-appearing elderly male lying in bed in no acute distress HEENT: ETT, MM pink/moist, PERRL,  Neuro: Will look her eyes open and grimace to pain CV: s1s2 regular rate and rhythm, no murmur, rubs, or gallops,  PULM: Coarse/rhonchorous breath sounds bilaterally, tolerating ventilator, no increased work of breathing GI: soft, bowel sounds active in all 4 quadrants, non-tender, non-distended Extremities: warm/dry, generalized nonpitting edema  Skin: no rashes or lesions  Resolved Hospital Problem list     Assessment & Plan:  In-hospital cardiac arrest -Suffered 16-minute cardiac arrest felt respiratory driven with multiple episodes of vomiting overnight and this a.m. Mildly displaced lateral right fourth and fifth rib fractures P: Continuous telemetry Continue pressors as below Family currently discussing goals of care/CODE STATUS Close monitoring in the ICU Ventilator support as below  Severe shock -Likely multifactorial including septic and possibly hemorrhagic however hemoglobin appears stable -Infection sources including aspiration pneumonia and colitis.  Empirically being treated with ceftriaxone and Flagyl prior to arrest P: At this point family wishes to continue current aggressive interventions including antibiotic therapy.  Will broaden antibiotics to include Zosyn and vancomycin Decision made to continue current peripheral pressors with no up titration with goal to allow additional family time to present to the hospital Start stress dose steroids  Acute on chronic hypoxic respiratory failure -Intubated postarrest.  Utilizes 2 L nasal cannula at night  Aspiration pneumonia  -Per chart multiple episodes of emesis overnight prior to arrest.  Per family emesis was red in appearance History of  COPD P: Continue ventilator support with lung protective strategies  Wean PEEP and FiO2 for sats greater than 90%. Head of bed elevated 30 degrees. Plateau pressures less than 30 cm H20.  Follow intermittent chest x-ray and ABG.   SAT/SBT as tolerated, mentation preclude extubation  Ensure adequate pulmonary hygiene  Follow cultures  VAP bundle in place  PAD protocol Antibiotics as above  At risk anoxic injury -In-hospital cardiac arrest with poor mentation post in the setting of continuous sedation P: Minimize sedation as able Aspiration precautions Seizure precautions Neuroprotective measures  AKI post renal/obstructive Bilateral hydronephrosis secondary to bladder mass now s/p bilateral nephrostomy tubes Hyperkalemia Metabolic acidosis secondary to above -Creatinine 7.04 on admission with downtrend to 3.75.  However unfortunately postarrest creatinine has increased to 4.07 P: Nephrology following, appreciate assistance  Follow renal function  Monitor urine output Trend Bmet Avoid nephrotoxins Ensure adequate renal perfusion   Colitis P: Continue shock treatment as above GI consulted, appreciate assistance currently tube unstable to undergo any endoscopy procedures  High suspicion for cancer posterior bladder wall thickening/mass with hematuria -As seen on admission CT P: Supportive care Continue Foley Continue nephrostomy tubes  History of CAD Essential hypertension P: Continuous telemetry Home antihypertensives on hold  Anasarca/lower extremity edema with pelvic lymphadenopathy P: Supportive care   Best Practice (right click and "Reselect all SmartList Selections" daily)   Diet/type: NPO DVT prophylaxis: SCD GI prophylaxis: PPI Lines: N/A Foley:  N/A Code Status:  DNR Last date of multidisciplinary goals of care discussion:  DNR see separate IPAL note   Labs   CBC: Recent Labs  Lab 06/16/22 0412 06/17/22 0410 06/18/22 0431 06/19/22 0421  06/20/22 0954  WBC 13.1* 11.8* 10.4 11.5* 18.7*  HGB 9.5* 9.4* 8.7* 9.7* 10.4*  HCT 29.2* 29.[name]* 27.1* 30.7* 34.1*  MCV 96.4 96.7 98.9 97.8 101.5*  PLT 383 394 366 406* 494*    Basic Metabolic Panel: Recent Labs  Lab 06/16/22 0412 06/17/22 0410 06/18/22 0431 06/19/22 0421 06/20/22 0409 06/20/22 0954  NA 142 142 141 141 142 141  K 4.4 3.8 3.4* 3.4* 3.1* 3.2*  CL 108 108 107 106 105 101  CO2 20* 21* 24 23 25 23   GLUCOSE 101* 109* 130* 117* 127* 238*  BUN 97* 87* 76* 65* 64* 69*  CREATININE [name].77* 5.91* 4.96* 4.08* 3.75* 4.07*  CALCIUM 8.4* 8.2* 7.9* 8.1* 8.5* 8.8*  MG  --   --  1.5*  --  1.8  --   PHOS 7.[name]* [name].3* 5.5* 4.1 4.1  --    GFR: Estimated Creatinine Clearance: 17.8 mL/min (A) (by C-G formula based on SCr of 4.07 mg/dL (H)). Recent Labs  Lab 07/08/2022 1459 06/15/22 0346 06/17/22 0410 06/18/22 0431 06/19/22 0421 06/20/22 0954  WBC 15.[name]*   < > 11.8* 10.4 11.5* 18.7*  LATICACIDVEN 1.0  --   --   --   --  [name].4*   < > = values in this interval not displayed.    Liver Function Tests: Recent Labs  Lab 06/23/2022 1459 06/15/22 0346 06/16/22 0412 06/17/22 0410 06/18/22 0431 06/19/22 0421 06/20/22 0409 06/20/22 0954  AST 13* 9*  --   --   --   --   --  27  ALT 11 9  --   --   --   --   --  16  ALKPHOS  63 45  --   --   --   --   --  54  BILITOT 0.9 0.[name]  --   --   --   --   --  0.7  PROT 7.3 [name].2*  --   --   --   --   --  7.1  ALBUMIN 3.5 2.8*   < > 2.7* 2.[name]* 2.[name]* 2.7* 2.9*   < > = values in this interval not displayed.   Recent Labs  Lab 06/18/2022 1459  LIPASE 30   No results for input(s): "AMMONIA" in the last 168 hours.  ABG    Component Value Date/Time   TCO2 28 01/09/2019 1133     Coagulation Profile: Recent Labs  Lab 06/15/22 1352  INR 1.2    Cardiac Enzymes: No results for input(s): "CKTOTAL", "CKMB", "CKMBINDEX", "TROPONINI" in the last 168 hours.  HbA1C: No results found for: "HGBA1C"  CBG: No results for input(s): "GLUCAP" in the  last 168 hours.  Review of Systems:   Unable to assess   Past Medical History:  He,  has a past medical history of Arthritis, CAD (coronary artery disease), Colon cancer (HCC), Complication of anesthesia, COPD (chronic obstructive pulmonary disease) (HCC), Dyspnea, Dysrhythmia, History of kidney stones, History of tobacco use, HOH (hard of hearing), Hypertension, and Pneumonia.   Surgical History:   Past Surgical History:  Procedure Laterality Date   ABDOMINAL AORTOGRAM W/LOWER EXTREMITY N/A 01/09/2019   Procedure: ABDOMINAL AORTOGRAM W/LOWER EXTREMITY;  Surgeon: Maeola Harman, MD;  Location: [facility] INVASIVE CV LAB;  Service: Cardiovascular;  Laterality: N/A;   BRONCHIAL BIOPSY  7/[name]/2021   Procedure: BRONCHIAL BIOPSIES;  Surgeon: Josephine Igo, DO;  Location: [facility] ENDOSCOPY;  Service: Pulmonary;;   BRONCHIAL BRUSHINGS  7/[name]/2021   Procedure: BRONCHIAL BRUSHINGS;  Surgeon: Josephine Igo, DO;  Location: [facility] ENDOSCOPY;  Service: Pulmonary;;   BRONCHIAL NEEDLE ASPIRATION BIOPSY  7/[name]/2021   Procedure: BRONCHIAL NEEDLE ASPIRATION BIOPSIES;  Surgeon: Josephine Igo, DO;  Location: [facility] ENDOSCOPY;  Service: Pulmonary;;   BRONCHIAL WASHINGS  7/[name]/2021   Procedure: BRONCHIAL WASHINGS;  Surgeon: Josephine Igo, DO;  Location: [facility] ENDOSCOPY;  Service: Pulmonary;;   CATARACT EXTRACTION W/PHACO Left 05/29/2016   Procedure: CATARACT EXTRACTION PHACO AND INTRAOCULAR LENS PLACEMENT (IOC);  Surgeon: Fabio Pierce, MD;  Location: [facility] ORS;  Service: Ophthalmology;  Laterality: Left;  CDE: 10.39   CATARACT EXTRACTION W/PHACO Right 06/12/2016   Procedure: CATARACT EXTRACTION PHACO AND INTRAOCULAR LENS PLACEMENT RIGHT EYE CDE= 13.59;  Surgeon: Fabio Pierce, MD;  Location: [facility] ORS;  Service: Ophthalmology;  Laterality: Right;  right   CORONARY ANGIOPLASTY WITH STENT PLACEMENT  2008   x 2   HEMICOLECTOMY  01/10/2010   INGUINAL HERNIA REPAIR  2012   IR NEPHROSTOMY PLACEMENT LEFT  5/[name]/2024   IR NEPHROSTOMY  PLACEMENT RIGHT  5/[name]/2024   PORTACATH PLACEMENT  2011   portacath removal  2011   UMBILICAL HERNIA REPAIR  01/2008   left   VIDEO BRONCHOSCOPY WITH ENDOBRONCHIAL NAVIGATION N/A 7/[name]/2021   Procedure: VIDEO BRONCHOSCOPY WITH ENDOBRONCHIAL NAVIGATION;  Surgeon: Josephine Igo, DO;  Location: [facility] ENDOSCOPY;  Service: Pulmonary;  Laterality: N/A;     Social History:   reports that he quit smoking about 18 years ago. His smoking use included cigarettes. He has a 171.00 pack-year smoking history. He has been exposed to tobacco smoke. He has never used smokeless tobacco. He reports that he does not drink alcohol and does not use  drugs.   Family History:  His family history is not on file. He was adopted.   Allergies Allergies  Allergen Reactions   Other     General anesthesia - cough    Sulfa Antibiotics     Unknown reaction - we had listed nausea      Home Medications  Prior to Admission medications   Medication Sig Start Date End Date Taking? Authorizing Provider  acetaminophen (TYLENOL) 500 MG tablet Take 1,000 mg by mouth every [name] (six) hours as needed.   Yes [provider]  albuterol (VENTOLIN HFA) 108 (90 Base) MCG/ACT inhaler Inhale 2 puffs into the lungs every [name] (six) hours as needed for wheezing or shortness of breath. Patient taking differently: Inhale 1 puff into the lungs every [name] (six) hours as needed for wheezing or shortness of breath. 08/29/21  Yes Cobb, Ruby Cola, NP  amLODipine-benazepril (LOTREL) 10-40 MG capsule Take 1 capsule by mouth daily.   Yes [provider]  aspirin 81 MG chewable tablet Chew 81 mg by mouth every evening.   Yes [provider]  atorvastatin (LIPITOR) 20 MG tablet Take 1 tablet (20 mg total) by mouth every evening. 11/22/18  Yes Glade Lloyd, MD  Cholecalciferol (VITAMIN D3) 50 MCG (2000 UT) TABS Take 2,000 Units by mouth daily.   Yes [provider]  Cinnamon 500 MG TABS Take 500 mg by mouth 2 (two) times  daily.   Yes [provider]  Cyanocobalamin (VITAMIN B12 PO) Use as directed 1 tablet in the mouth or throat daily.   Yes [provider]  Emollient (CERAVE) CREA Apply 1 application topically 2 (two) times daily.   Yes [provider]  ferrous sulfate 325 (65 FE) MG tablet Take 325 mg by mouth daily with breakfast.   Yes [provider]  Fluticasone-Umeclidin-Vilant (TRELEGY ELLIPTA) 200-62.5-25 MCG/ACT AEPB Inhale 1 puff into the lungs daily. 08/29/21  Yes Cobb, Ruby Cola, NP  hydrochlorothiazide (HYDRODIURIL) 25 MG tablet Take 25 mg by mouth daily.   Yes [provider]  loperamide (IMODIUM A-D) 2 MG tablet Take 2 mg by mouth 4 (four) times daily as needed for diarrhea or loose stools.   Yes [provider]  Magnesium 250 MG TABS Take 250 mg by mouth daily.    Yes [provider]  metoprolol tartrate (LOPRESSOR) 100 MG tablet Take 1 tablet (100 mg total) by mouth at bedtime. 11/22/18  Yes Glade Lloyd, MD  Multiple Vitamins-Minerals (MENS MULTIPLE VITAMIN/LYCOPENE PO) Take 1 tablet by mouth daily.   Yes [provider]  nitroGLYCERIN (NITROSTAT) 0.4 MG SL tablet Place 1 tablet (0.4 mg total) under the tongue every 5 (five) minutes as needed. 12/27/12  Yes Rollene Rotunda, MD  OVER THE COUNTER MEDICATION Take 1 tablet by mouth 2 (two) times daily. Fish, flax and borage oil   Yes [provider]  Turmeric (QC TUMERIC COMPLEX PO) Take by mouth 2 (two) times daily.   Yes [provider]  zinc gluconate 50 MG tablet Take 50 mg by mouth daily.   Yes [provider]     Critical care time:   CRITICAL CARE Performed by: Terriana Barreras D. Harris   Total critical care time: 50 minutes  Critical care time was exclusive of separately billable procedures and treating other patients.  Critical care was necessary to treat or prevent imminent or life-threatening deterioration.  Critical care was time spent  personally by me on the following activities: development of treatment  plan with patient and/or surrogate as well as nursing, discussions with consultants, evaluation of patient's response to treatment, examination of patient, obtaining history from patient or surrogate, ordering and performing treatments and interventions, ordering and review of laboratory studies, ordering and review of radiographic studies, pulse oximetry and re-evaluation of patient's condition.  Jizel Cheeks D. Harris, NP-C [facility] Personal contact information can be found on Amion  If no contact or response made please call [phone] 06/20/2022, 2:53 PM

## 2022-06-20 NOTE — Progress Notes (Signed)
ET tube advanced from 23 to 26 cm at the lip per written order.

## 2022-06-20 NOTE — Progress Notes (Signed)
06/20/2022 PCCM note  Accepted for transfer, heamtemesis, loss of airway, intubated.  To [facility] for blood, GI eval, vent management.  Reach out if questions/concerns.  Henderson Baltimore MD PCCM

## 2022-06-20 NOTE — Progress Notes (Signed)
Patient arrived to unit about 10:30am s/p code blue on the med/surg unit and intubated. Levo, precedex, and IVF initiated on arrival. CXR completed to verify placement of OJ and ETT. N/o received to transfer to [facility] 2H. Care Link in and report given about 11:45am. Report also, called to [facility] 2H nurse including the need to transfuse 2uPRBCs. Plan of care reviewed with family and they agree

## 2022-06-21 DIAGNOSIS — K529 Noninfective gastroenteritis and colitis, unspecified: Secondary | ICD-10-CM

## 2022-06-21 DIAGNOSIS — N179 Acute kidney failure, unspecified: Secondary | ICD-10-CM | POA: Diagnosis not present

## 2022-06-21 LAB — CBC
HCT: 28 % — ABNORMAL LOW (ref 39.0–52.0)
Hemoglobin: 8.9 g/dL — ABNORMAL LOW (ref 13.0–17.0)
MCH: 31.2 pg (ref 26.0–34.0)
MCHC: 31.8 g/dL (ref 30.0–36.0)
MCV: 98.2 fL (ref 80.0–100.0)
Platelets: 348 10*3/uL (ref 150–400)
RBC: 2.85 MIL/uL — ABNORMAL LOW (ref 4.22–5.81)
RDW: 14.2 % (ref 11.5–15.5)
WBC: 22.5 10*3/uL — ABNORMAL HIGH (ref 4.0–10.5)
nRBC: 0.1 % (ref 0.0–0.2)

## 2022-06-21 LAB — MAGNESIUM: Magnesium: 1.6 mg/dL — ABNORMAL LOW (ref 1.7–2.4)

## 2022-06-21 LAB — RENAL FUNCTION PANEL
Albumin: 2.2 g/dL — ABNORMAL LOW (ref 3.5–5.0)
Anion gap: 18 — ABNORMAL HIGH (ref 5–15)
BUN: 80 mg/dL — ABNORMAL HIGH (ref 8–23)
CO2: 21 mmol/L — ABNORMAL LOW (ref 22–32)
Calcium: 8.5 mg/dL — ABNORMAL LOW (ref 8.9–10.3)
Chloride: 101 mmol/L (ref 98–111)
Creatinine, Ser: 4.66 mg/dL — ABNORMAL HIGH (ref 0.61–1.24)
GFR, Estimated: 12 mL/min — ABNORMAL LOW (ref >60)
Glucose, Bld: 148 mg/dL — ABNORMAL HIGH (ref 70–99)
Phosphorus: 5.8 mg/dL — ABNORMAL HIGH (ref 2.5–4.6)
Potassium: 4.2 mmol/L (ref 3.5–5.1)
Sodium: 140 mmol/L (ref 135–145)

## 2022-06-21 MED ORDER — GLYCOPYRROLATE 1 MG PO TABS: 1.0000 mg | ORAL_TABLET | ORAL | Status: DC | PRN

## 2022-06-21 MED ORDER — SODIUM CHLORIDE 0.9 % IV SOLN: INTRAVENOUS | Status: DC

## 2022-06-21 MED ORDER — FENTANYL BOLUS VIA INFUSION: 100.0000 ug | INTRAVENOUS | Status: DC | PRN

## 2022-06-21 MED ORDER — ACETAMINOPHEN 325 MG PO TABS: 650.0000 mg | ORAL_TABLET | Freq: Four times a day (QID) | ORAL | Status: DC | PRN

## 2022-06-21 MED ORDER — HALOPERIDOL LACTATE 5 MG/ML IJ SOLN: 2.5000 mg | INTRAMUSCULAR | Status: DC | PRN

## 2022-06-21 MED ORDER — DIPHENHYDRAMINE HCL 50 MG/ML IJ SOLN: 25.0000 mg | INTRAMUSCULAR | Status: DC | PRN

## 2022-06-21 MED ORDER — MIDAZOLAM HCL 2 MG/2ML IJ SOLN: 2.0000 mg | INTRAMUSCULAR | Status: DC | PRN

## 2022-06-21 MED ORDER — GLYCOPYRROLATE 0.2 MG/ML IJ SOLN: 0.2000 mg | INTRAMUSCULAR | Status: DC | PRN

## 2022-06-21 MED ORDER — POLYVINYL ALCOHOL 1.4 % OP SOLN: 1.0000 [drp] | Freq: Four times a day (QID) | OPHTHALMIC | Status: DC | PRN

## 2022-06-21 MED ORDER — ACETAMINOPHEN 650 MG RE SUPP: 650.0000 mg | Freq: Four times a day (QID) | RECTAL | Status: DC | PRN

## 2022-06-21 MED ORDER — ONDANSETRON HCL 4 MG/2ML IJ SOLN: 4.0000 mg | Freq: Four times a day (QID) | INTRAMUSCULAR | Status: DC | PRN

## 2022-06-21 MED ORDER — MIDAZOLAM HCL 2 MG/2ML IJ SOLN: 1.0000 mg | INTRAMUSCULAR | Status: DC | PRN

## 2022-06-21 MED ORDER — FENTANYL 2500MCG IN NS 250ML (10MCG/ML) PREMIX INFUSION
0.0000 ug/h | INTRAVENOUS | Status: DC
  Administered 2022-06-21: 50 ug/h via INTRAVENOUS
  Administered 2022-06-22: 400 ug/h via INTRAVENOUS
  Filled 2022-06-21 (×2): qty 250

## 2022-06-21 MED ORDER — GLYCOPYRROLATE 0.2 MG/ML IJ SOLN
0.2000 mg | INTRAMUSCULAR | Status: DC | PRN
  Administered 2022-06-21: 0.2 mg via INTRAVENOUS
  Filled 2022-06-21: qty 1

## 2022-06-21 MED ORDER — SODIUM CHLORIDE 0.9 % IV SOLN
12.5000 mg | Freq: Four times a day (QID) | INTRAVENOUS | Status: DC | PRN
  Filled 2022-06-21: qty 0.5

## 2022-06-21 MED ORDER — ONDANSETRON 4 MG PO TBDP: 4.0000 mg | ORAL_TABLET | Freq: Four times a day (QID) | ORAL | Status: DC | PRN

## 2022-06-21 NOTE — Progress Notes (Signed)
eLink Physician-Brief Progress Note Patient Name: [name] DOB: [birth year] MRN: [id number]   Date of Service  06/21/2022  HPI/Events of Note  87 year old male with history of coronary artery disease, essential hypertension, and chronic respiratory failure with COPD on 2 L of oxygen.  He was recently diagnosed with colorectal adenocarcinoma status post partial colectomy and eventually had some aspiration pneumonitis that presented with in-hospital cardiac arrest.  Ultimately, family opted to pursue comfort measures.  With the intent to do inpatient comfort measures.  eICU Interventions  Removed scheduled antibiotics, subcutaneous heparin and other non comfort meds.     Intervention Category Minor Interventions: Routine modifications to care plan (e.g. PRN medications for pain, fever)  Dex Blakely 06/21/2022, 10:55 PM

## 2022-06-21 NOTE — Procedures (Signed)
Extubation Procedure Note  Patient Details:   Name: [name] DOB: 07/09/1935 MRN: [id number]   Airway Documentation:    Vent end date: 06/21/22 Vent end time: 1208   Evaluation  O2 sats: stable throughout Complications: No apparent complications Patient did not tolerate procedure well. Bilateral Breath Sounds: Clear, Diminished (coarse)   No  [name] 06/21/2022, 12:14 PM Pt extubated to [facility].  Pt did not speak at extubation, but VS are WNR at this time.

## 2022-06-21 NOTE — Progress Notes (Signed)
Pt began to desat and adjustments were made to the settings at this time.

## 2022-06-21 NOTE — Progress Notes (Signed)
Patient spouse, [name] left a cell phone charge at [facility] ICU yesterday during an emergency. Writer called [name] to inform her  that the charger has been located and she can pick it up form the [facility] ICU. Writer informed that if [name] is not able to p/u the charger by tomorrow evening, the charge will be handed over to security and she can retrieve it anytime from security.

## 2022-06-21 NOTE — Progress Notes (Signed)
   NAME:  [name], MRN:  [id number], DOB:  Apr 16, 1935, LOS: 7 ADMISSION DATE:  06/16/2022, CONSULTATION DATE:  06/20/2022 REFERRING MD: Dr. Laural Benes - [facility]  CHIEF COMPLAINT:  Hemmorrhagic shock,  History of Present Illness:  [name] is a 87 y.o. with a past medical history significant for CAD, HTN, chronic respiratory failure on 2 L at bedtime, COPD, history of colorectal adenocarcinoma 2011 s/p partial colectomy, and arthritis who presented to the emergency department at [facility] 5/2 for complaints of abdominal discomfort and distention with difficulty urinating.  Workup on ED admission with ABD revealed bilateral hydronephrosis, posterior bladder wall thickening with wall thickening at the descending colon as well.  Early morning of 5/11 patient had episode of hematic emesis with signs of hemorrhagic shock resulting in cardiac arrest.  Patient was transferred to ICU at [facility] for further management.  Pertinent  Medical History  CAD, HTN, chronic respiratory failure on 2 L at bedtime, COPD, history of colorectal adenocarcinoma 2011 s/p partial colectomy, and arthritis   Significant Hospital Events: Including procedures, antibiotic start and stop dates in addition to other pertinent events   5/5 presented with abdominal discomfort and difficulty urinating 5/6 bilateral nephrostomy tubes placed per IR 5/11 suffered 16-minute cardiac arrest felt respiratory driven with multiple episodes of vomiting overnight and this a.m.  Interim History / Subjective:  Starting to follow commands, on levo 4 1 x bilious emesis overnight.  Objective   Blood pressure 104/61, pulse 81, temperature 97.8 F (36.6 C), temperature source Axillary, resp. rate (!) 26, height 6\' 2"  (1.88 m), weight 113.4 kg, SpO2 99 %.    Vent Mode: PRVC FiO2 (%):  [100 %] 100 % Set Rate:  [20 bmp-24 bmp] 24 bmp Vt Set:  [650 mL-660 mL] 650 mL PEEP:  [5 cmH20] 5 cmH20 Plateau Pressure:  [18 cmH20-28 cmH20] 20 cmH20    Intake/Output Summary (Last 24 hours) at 06/21/2022 0716 Last data filed at 06/21/2022 0600 Gross per 24 hour  Intake 4103.27 ml  Output 1060 ml  Net 3043.27 ml    Filed Weights   07/07/2022 1425  Weight: 113.4 kg    Examination: Pretty sedated for me Pupils small Nephrostomy amber urine bilaterally Abd distended, +BS Ext warm Lungs diminished + LE edema  Cr stably elevated K a little low WBC up slightly Plts up  Resolved Hospital Problem list     Assessment & Plan:  [facility]- seems more aspiration related?  Vagal? Aspiration pneumonitis Likely metastatic bladder cancer, new undiagnosed Obstructive renal failure post nephrostomy tube placement Question of UGIB- sluggish response to 2 units Shock- hemorrhagic now seems less likely, improving pressor needs, question an aspiration History of CAD Essential hypertension Colitis NOS COPD  - Vanc/zosyn, f/u culture data - Levo to MAP 65 - PPI gtt  Long talk with family at bedside, patient really did not want aggressive care nor did it sound like he was strong enough to consider biopsy and aggressive therapy for this cancer.  Baseline O2-dependent COPD with FTT, weight loss.  All in agreement that comfort is the goal.   Will await remainder of family, extubate to [facility], work toward hospice if does okay, if not transition to inpatient comfort care.  Total ccm time 85 mins Myrla Halsted MD PCCM

## 2022-06-21 NOTE — IPAL (Addendum)
  Interdisciplinary Goals of Care Family Meeting   Date carried out: 06/21/2022  Location of the meeting: Bedside  Member's involved: Physician, Bedside Registered Nurse, and Family Member or next of kin  Durable Power of Attorney or acting medical decision maker: Wife    Discussion: We discussed goals of care for [employer] .    Extubated, rattling upper airway sounds,SOB. All in agreement including patient to transition to inpatient comfort care.  In hospital death expected.  Fent gtt to be started  Code status:   Code Status: DNR   Disposition: Inpatient comfort care, no escalation, can eat drink PRN  Time spent for the meeting: 8 minutes    Lorin Glass, MD  06/21/2022, 4:45 PM

## 2022-06-22 DIAGNOSIS — N179 Acute kidney failure, unspecified: Secondary | ICD-10-CM | POA: Diagnosis not present

## 2022-06-22 MED ORDER — MORPHINE BOLUS VIA INFUSION: 5.0000 mg | INTRAVENOUS | Status: DC | PRN

## 2022-06-22 MED ORDER — MORPHINE 100MG IN NS 100ML (1MG/ML) PREMIX INFUSION: 0.0000 mg/h | INTRAVENOUS | Status: DC

## 2022-06-22 MED ORDER — ORAL CARE MOUTH RINSE: 15.0000 mL | OROMUCOSAL | Status: DC | PRN

## 2022-06-24 LAB — TYPE AND SCREEN
Antibody Screen: NEGATIVE
Unit division: 0
Unit division: 0

## 2022-06-24 LAB — BPAM RBC
Blood Product Expiration Date: 202406122359
Blood Product Expiration Date: 202406122359
Unit Type and Rh: 5100
Unit Type and Rh: 5100

## 2022-07-11 NOTE — Progress Notes (Signed)
PT Cancellation Note  Patient Details Name: [name] MRN: [id number] DOB: [birth year]   Cancelled Treatment:    Reason Eval/Treat Not Completed: Other (comment) per chart review and discussion with RN, pt is now comfort care. PT will sign off at this time. Feel free to re-consult if change in status or needs.   Vickki Muff, PT, DPT   Acute Rehabilitation Department Office [phone] Secure Chat Communication Preferred   Ronnie Derby 07/01/2022, 8:49 AM

## 2022-07-11 NOTE — Discharge Summary (Signed)
DEATH SUMMARY   Patient Details  Name: [name] MRN: [id number] DOB: 1935/12/07  Admission/Discharge Information   Admit Date:  06-24-22  Date of Death: Date of Death: 2022-07-02  Time of Death: Time of Death: 0939  Length of Stay: 8  Referring Physician: Verlee Monte, NP   Reason(s) for Hospitalization  Abdominal pain   Diagnoses  Preliminary cause of death: hemorrhagic shock Secondary Diagnoses (including complications and co-morbidities):  Principal Problem:   AKI (acute kidney injury) (HCC) Active Problems:   HTN (hypertension)   Dyslipidemia   COPD mixed type (HCC)   Acute on chronic respiratory failure with hypoxia (HCC)   Obesity (BMI 30-39.9)   Abdominal pain, generalized   Other hydronephrosis   Recurrent ventral hernia   Neoplasm of uncertain behavior of skin   Acute diarrhea   Noninfectious gastroenteritis   Brief Hospital Course (including significant findings, care, treatment, and services provided and events leading to death)  [name] is a 87 y.o. year old male who suffered a cardiac arrest following hematemesis causing hemorrhagic shock. He remained in shock due to aspiration. Imaging evidence of metastatic bladder cancer.  Poor baseline function with O2-dependent COPD with FTT, weight loss. Family decided to transition to comfort care.   Pertinent Labs and Studies  Significant Diagnostic Studies DG CHEST PORT 1 VIEW  Result Date: 06/20/2022 CLINICAL DATA:  Rapid response, intubation the tube placement. EXAM: PORTABLE CHEST 1 VIEW COMPARISON:  Chest radiograph 06/15/2022; CT chest 06/05/2022 FINDINGS: Endotracheal tube tip projects 6 cm above the carina. Enteric tube tip projects just below the level of the diaphragm, near the expected location of the gastroesophageal junction. Partially visualized left nephrostomy tube. Cardiac pads overlie the midline and lower left chest wall. Stable enlarged cardiac silhouette. Mild interstitial thickening at  the lung bases, right-greater-than-left. Hazy airspace opacity in the medial right lung base, new since 06/15/2022. Subtle focal opacity in the peripheral right upper lobe is favored to correspond with the spiculated nodule noted on CT 06/05/22. No pleural effusion or pneumothorax. Mildly displaced lateral right fourth and fifth rib fractures. IMPRESSION: 1. Endotracheal tube tip projects 6 cm above the carina. 2. Enteric tube tip projects just below the level of the diaphragm, near the expected location of the gastroesophageal junction. 3. Hazy airspace opacity in the medial right lung base. This could represent atelectasis, aspiration changes or possibly infection in the appropriate clinical context. 4. Mildly displaced lateral right fourth and fifth rib fractures. Electronically Signed   By: Sherron Ales M.D.   On: 06/20/2022 11:24   ECHOCARDIOGRAM COMPLETE  Result Date: 06/16/2022    ECHOCARDIOGRAM REPORT   Patient Name:   [name] Date of Exam: 06/16/2022 Medical Rec #:  [id number]     Height:       74.0 in Accession #:    [id number]    Weight:       250.0 lb Date of Birth:  January 08, 1936      BSA:          2.390 m Patient Age:    87 years      BP:           144/75 mmHg Patient Gender: M             HR:           94 bpm. [facility] Procedure: 2D Echo, Cardiac Doppler, Color Doppler and Intracardiac  Opacification Agent Indications:    CHF-Acute Diastolic I50.31  History:        Patient has prior history of Echocardiogram examinations, most                 recent 11/19/2018. CAD, Signs/Symptoms:Shortness of Breath; Risk                 Factors:Hypertension, Dyslipidemia and Former Smoker.  Sonographer:    Aron Baba Referring Phys: [id number] DAVID TAT  Sonographer Comments: Technically difficult study due to poor echo windows. Image acquisition challenging due to COPD. IMPRESSIONS  1. Left ventricular ejection fraction, by estimation, is 55 to 60%. The left ventricle has normal function.  The left ventricle has no regional wall motion abnormalities. There is mild left ventricular hypertrophy. Left ventricular diastolic parameters are consistent with Grade I diastolic dysfunction (impaired relaxation).  2. RV not well visualized. Grossly appears normal in size and function. . Right ventricular systolic function was not well visualized. The right ventricular size is not well visualized. There is normal pulmonary artery systolic pressure.  3. Left atrial size was mildly dilated.  4. The mitral valve is normal in structure. No evidence of mitral valve regurgitation. No evidence of mitral stenosis.  5. The aortic valve is tricuspid. There is moderate calcification of the aortic valve. There is moderate thickening of the aortic valve. Aortic valve regurgitation is not visualized. No aortic stenosis is present.  6. There is mild dilatation of the ascending aorta, measuring 43 mm.  7. The inferior vena cava is normal in size with greater than 50% respiratory variability, suggesting right atrial pressure of 3 mmHg. FINDINGS  Left Ventricle: Left ventricular ejection fraction, by estimation, is 55 to 60%. The left ventricle has normal function. The left ventricle has no regional wall motion abnormalities. Definity contrast agent was given IV to delineate the left ventricular  endocardial borders. The left ventricular internal cavity size was normal in size. There is mild left ventricular hypertrophy. Left ventricular diastolic parameters are consistent with Grade I diastolic dysfunction (impaired relaxation). Normal left ventricular filling pressure. Right Ventricle: RV not well visualized. Grossly appears normal in size and function. The right ventricular size is not well visualized. Right vetricular wall thickness was not well visualized. Right ventricular systolic function was not well visualized.  There is normal pulmonary artery systolic pressure. The tricuspid regurgitant velocity is 1.62 m/s, and with  an assumed right atrial pressure of 3 mmHg, the estimated right ventricular systolic pressure is 13.5 mmHg. Left Atrium: Left atrial size was mildly dilated. Right Atrium: Right atrial size was normal in size. Pericardium: There is no evidence of pericardial effusion. Mitral Valve: The mitral valve is normal in structure. There is mild thickening of the mitral valve leaflet(s). There is mild calcification of the mitral valve leaflet(s). Mild mitral annular calcification. No evidence of mitral valve regurgitation. No evidence of mitral valve stenosis. Tricuspid Valve: The tricuspid valve is normal in structure. Tricuspid valve regurgitation is not demonstrated. No evidence of tricuspid stenosis. Aortic Valve: The aortic valve is tricuspid. There is moderate calcification of the aortic valve. There is moderate thickening of the aortic valve. There is moderate aortic valve annular calcification. Aortic valve regurgitation is not visualized. No aortic stenosis is present. Aortic valve mean gradient measures 9.0 mmHg. Aortic valve peak gradient measures 17.6 mmHg. Aortic valve area, by VTI measures 2.99 cm. Pulmonic Valve: The pulmonic valve was not well visualized. Pulmonic valve regurgitation is not visualized. No evidence of  pulmonic stenosis. Aorta: The aortic root is normal in size and structure. There is mild dilatation of the ascending aorta, measuring 43 mm. Venous: The inferior vena cava is normal in size with greater than 50% respiratory variability, suggesting right atrial pressure of 3 mmHg. IAS/Shunts: No atrial level shunt detected by color flow Doppler.  LEFT VENTRICLE PLAX 2D LVIDd:         4.10 cm   Diastology LVIDs:         3.00 cm   LV e' medial:    4.88 cm/s LV PW:         1.30 cm   LV E/e' medial:  15.5 LV IVS:        1.10 cm   LV e' lateral:   9.67 cm/s LVOT diam:     2.40 cm   LV E/e' lateral: 7.8 LV SV:         102 LV SV Index:   43 LVOT Area:     4.52 cm  LEFT ATRIUM           Index         RIGHT ATRIUM           Index LA diam:      4.00 cm 1.67 cm/m   RA Area:     17.10 cm LA Vol (A2C): 77.0 ml 32.22 ml/m  RA Volume:   32.70 ml  13.68 ml/m LA Vol (A4C): 92.1 ml 38.54 ml/m  AORTIC VALVE                     PULMONIC VALVE AV Area (Vmax):    2.95 cm      PR End Diast Vel: 6.97 msec AV Area (Vmean):   2.70 cm AV Area (VTI):     2.99 cm AV Vmax:           210.00 cm/s AV Vmean:          137.500 cm/s AV VTI:            0.340 m AV Peak Grad:      17.6 mmHg AV Mean Grad:      9.0 mmHg LVOT Vmax:         137.00 cm/s LVOT Vmean:        82.000 cm/s LVOT VTI:          0.225 m LVOT/AV VTI ratio: 0.66  AORTA Ao Root diam: 3.80 cm Ao Asc diam:  4.30 cm MITRAL VALVE                TRICUSPID VALVE MV Area (PHT): 2.84 cm     TR Peak grad:   10.5 mmHg MV Decel Time: 267 msec     TR Vmax:        162.00 cm/s MV E velocity: 75.70 cm/s MV A velocity: 107.00 cm/s  SHUNTS MV E/A ratio:  0.71         Systemic VTI:  0.22 m                             Systemic Diam: 2.40 cm Dina Rich MD Electronically signed by Dina Rich MD Signature Date/Time: 06/16/2022/12:06:51 PM    Final    IR NEPHROSTOMY PLACEMENT LEFT  Result Date: 06/15/2022 INDICATION: 87 year old male with bilateral hydronephrosis and acute renal failure. He presents for bilateral percutaneous nephrostomy tube placement. EXAM: IR NEPHROSTOMY PLACEMENT LEFT; IR NEPHROSTOMY PLACEMENT RIGHT  COMPARISON:  None Available. MEDICATIONS: Rocephin 2 g IV; The antibiotic was administered in an appropriate time frame prior to skin puncture. ANESTHESIA/SEDATION: Fentanyl 100 mcg IV; Versed 2 mg IV Moderate Sedation Time:  25 minutes The patient's vital signs and level of consciousness were continuously monitored during the procedure by the interventional radiology nurse under my direct supervision. CONTRAST:  20mL OMNIPAQUE IOHEXOL 300 MG/ML SOLN - administered into the collecting system(s) FLUOROSCOPY: Fluoroscopy Time:  minutes  seconds ( mGy). COMPLICATIONS:  None immediate. TECHNIQUE: The procedure, risks, benefits, and alternatives were explained to the patient. Questions regarding the procedure were encouraged and answered. The patient understands and consents to the procedure. LEFT The left flank was prepped with chlorhexidine in a sterile fashion, and a sterile drape was applied covering the operative field. A sterile gown and sterile gloves were used for the procedure. Local anesthesia was provided with 1% Lidocaine. The left flank was interrogated with ultrasound and the left kidney identified. The kidney is hydronephrotic. A suitable access site on the skin overlying the lower pole, posterior calix was identified. After local mg anesthesia was achieved, a small skin nick was made with an 11 blade scalpel. A 21 gauge Accustick needle was then advanced under direct sonographic guidance into the lower pole of the left kidney. A 0.018 inch wire was advanced under fluoroscopic guidance into the left renal collecting system. The Accustick sheath was then advanced over the wire and a 0.018 system exchanged for a 0.035 system. Gentle hand injection of contrast material confirms placement of the sheath within the renal collecting system. There is severe hydronephrosis. The tract from the scan into the renal collecting system was then dilated serially to 10-French. A 10-French Cook all-purpose drain was then placed and positioned under fluoroscopic guidance. The locking loop is well formed within the left renal pelvis. The catheter was secured to the skin with 2-0 Prolene and a sterile bandage was placed. Catheter was left to gravity bag drainage. RIGHT The right flank was prepped with chlorhexidine in a sterile fashion, and a sterile drape was applied covering the operative field. A sterile gown and sterile gloves were used for the procedure. Local anesthesia was provided with 1% Lidocaine. The right flank was interrogated with ultrasound and the left kidney identified.  The kidney is hydronephrotic. There are multiple large cysts overlying the posterior calices. A suitable access site on the skin overlying the lower pole, posterior calix was identified. After local anesthesia was achieved, a small skin nick was made with an 11 blade scalpel. A 21 gauge Accustick needle was then advanced under direct sonographic guidance into the cyst. Aspiration was performed. The cyst was decompressed. Next, the Accustick needle was advanced into the calyx within the lower pole of the right kidney. A 0.018 inch wire was advanced under fluoroscopic guidance into the left renal collecting system. The Accustick sheath was then advanced over the wire and a 0.018 system exchanged for a 0.035 system. Gentle hand injection of contrast material confirms placement of the sheath within the renal collecting system. There is severe hydronephrosis. The tract from the scan into the renal collecting system was then dilated serially to 10-French. A 10-French Cook all-purpose drain was then placed and positioned under fluoroscopic guidance. The locking loop is well formed within the left renal pelvis. The catheter was secured to the skin with 2-0 Prolene and a sterile bandage was placed. Catheter was left to gravity bag drainage. IMPRESSION: Successful placement of a bilateral 10 [place] percutaneous  nephrostomy tubes. Electronically Signed   By: Malachy Moan M.D.   On: 06/15/2022 17:34   IR NEPHROSTOMY PLACEMENT RIGHT  Result Date: 06/15/2022 INDICATION: 87 year old male with bilateral hydronephrosis and acute renal failure. He presents for bilateral percutaneous nephrostomy tube placement. EXAM: IR NEPHROSTOMY PLACEMENT LEFT; IR NEPHROSTOMY PLACEMENT RIGHT COMPARISON:  None Available. MEDICATIONS: Rocephin 2 g IV; The antibiotic was administered in an appropriate time frame prior to skin puncture. ANESTHESIA/SEDATION: Fentanyl 100 mcg IV; Versed 2 mg IV Moderate Sedation Time:  25 minutes The patient's  vital signs and level of consciousness were continuously monitored during the procedure by the interventional radiology nurse under my direct supervision. CONTRAST:  20mL OMNIPAQUE IOHEXOL 300 MG/ML SOLN - administered into the collecting system(s) FLUOROSCOPY: Fluoroscopy Time:  minutes  seconds ( mGy). COMPLICATIONS: None immediate. TECHNIQUE: The procedure, risks, benefits, and alternatives were explained to the patient. Questions regarding the procedure were encouraged and answered. The patient understands and consents to the procedure. LEFT The left flank was prepped with chlorhexidine in a sterile fashion, and a sterile drape was applied covering the operative field. A sterile gown and sterile gloves were used for the procedure. Local anesthesia was provided with 1% Lidocaine. The left flank was interrogated with ultrasound and the left kidney identified. The kidney is hydronephrotic. A suitable access site on the skin overlying the lower pole, posterior calix was identified. After local mg anesthesia was achieved, a small skin nick was made with an 11 blade scalpel. A 21 gauge Accustick needle was then advanced under direct sonographic guidance into the lower pole of the left kidney. A 0.018 inch wire was advanced under fluoroscopic guidance into the left renal collecting system. The Accustick sheath was then advanced over the wire and a 0.018 system exchanged for a 0.035 system. Gentle hand injection of contrast material confirms placement of the sheath within the renal collecting system. There is severe hydronephrosis. The tract from the scan into the renal collecting system was then dilated serially to 10-French. A 10-French Cook all-purpose drain was then placed and positioned under fluoroscopic guidance. The locking loop is well formed within the left renal pelvis. The catheter was secured to the skin with 2-0 Prolene and a sterile bandage was placed. Catheter was left to gravity bag drainage. RIGHT The  right flank was prepped with chlorhexidine in a sterile fashion, and a sterile drape was applied covering the operative field. A sterile gown and sterile gloves were used for the procedure. Local anesthesia was provided with 1% Lidocaine. The right flank was interrogated with ultrasound and the left kidney identified. The kidney is hydronephrotic. There are multiple large cysts overlying the posterior calices. A suitable access site on the skin overlying the lower pole, posterior calix was identified. After local anesthesia was achieved, a small skin nick was made with an 11 blade scalpel. A 21 gauge Accustick needle was then advanced under direct sonographic guidance into the cyst. Aspiration was performed. The cyst was decompressed. Next, the Accustick needle was advanced into the calyx within the lower pole of the right kidney. A 0.018 inch wire was advanced under fluoroscopic guidance into the left renal collecting system. The Accustick sheath was then advanced over the wire and a 0.018 system exchanged for a 0.035 system. Gentle hand injection of contrast material confirms placement of the sheath within the renal collecting system. There is severe hydronephrosis. The tract from the scan into the renal collecting system was then dilated serially to 10-French. A 10-French Adriana Simas  all-purpose drain was then placed and positioned under fluoroscopic guidance. The locking loop is well formed within the left renal pelvis. The catheter was secured to the skin with 2-0 Prolene and a sterile bandage was placed. Catheter was left to gravity bag drainage. IMPRESSION: Successful placement of a bilateral 10 French percutaneous nephrostomy tubes. Electronically Signed   By: Malachy Moan M.D.   On: 06/15/2022 17:34   US RENAL  Result Date: 06/15/2022 CLINICAL DATA:  [id number] AKI (acute kidney injury) (HCC) [id number] EXAM: RENAL / URINARY TRACT ULTRASOUND COMPLETE COMPARISON:  CT 06/19/2022 FINDINGS: Right Kidney: Renal  measurements: 13.1 x 7.0 x 7.6 cm = volume: 363.7 mL. Persistent moderate hydronephrosis. Left Kidney: Renal measurements: 11.9 x 7.4 x 5.6 cm = volume: 260.1 mL. Persistent moderate hydronephrosis. Bladder: Foley catheter is in place with heterogeneous hyperechoic material adjacent to the Foley balloon, which is avascular. Posterior bladder wall thickening. Other: None. IMPRESSION: Persistent moderate hydronephrosis, similar to recent CT. Intraluminal echogenic material in the bladder which could represent blood clot, correlate with urinalysis. Posterior bladder wall thickening as seen on recent CT. Electronically Signed   By: Caprice Renshaw M.D.   On: 06/15/2022 12:38   US Venous Img Lower Bilateral (DVT)  Result Date: 06/15/2022 CLINICAL DATA:  Bilateral lower extremity pain and edema, left-greater-than-right. Former smoker. Evaluate for DVT. EXAM: BILATERAL LOWER EXTREMITY VENOUS DOPPLER ULTRASOUND TECHNIQUE: Gray-scale sonography with graded compression, as well as color Doppler and duplex ultrasound were performed to evaluate the lower extremity deep venous systems from the level of the common femoral vein and including the common femoral, femoral, profunda femoral, popliteal and calf veins including the posterior tibial, peroneal and gastrocnemius veins when visible. The superficial great saphenous vein was also interrogated. Spectral Doppler was utilized to evaluate flow at rest and with distal augmentation maneuvers in the common femoral, femoral and popliteal veins. COMPARISON:  CT abdomen and pelvis-06/24/2022 FINDINGS: Examination is degraded due to patient body habitus and poor sonographic window RIGHT LOWER EXTREMITY Common Femoral Vein: No evidence of thrombus. Normal compressibility, respiratory phasicity and response to augmentation. Saphenofemoral Junction: No evidence of thrombus. Normal compressibility and flow on color Doppler imaging. Profunda Femoral Vein: No evidence of thrombus. Normal  compressibility and flow on color Doppler imaging. Femoral Vein: No evidence of thrombus. Normal compressibility, respiratory phasicity and response to augmentation. Popliteal Vein: No evidence of thrombus. Normal compressibility, respiratory phasicity and response to augmentation. Calf Veins: No evidence of thrombus. Normal compressibility and flow on color Doppler imaging. Superficial Great Saphenous Vein: No evidence of thrombus. Normal compressibility. Other Findings:  None. LEFT LOWER EXTREMITY Common Femoral Vein: No evidence of thrombus. Normal compressibility, respiratory phasicity and response to augmentation. Saphenofemoral Junction: No evidence of thrombus. Normal compressibility and flow on color Doppler imaging. Profunda Femoral Vein: No evidence of thrombus. Normal compressibility and flow on color Doppler imaging. Femoral Vein: No evidence of thrombus. Normal compressibility, respiratory phasicity and response to augmentation. Popliteal Vein: No evidence of thrombus. Normal compressibility, respiratory phasicity and response to augmentation. Calf Veins: No evidence of thrombus. Normal compressibility and flow on color Doppler imaging. Superficial Great Saphenous Vein: No evidence of thrombus. Normal compressibility. Other Findings: Pathologically enlarged left inguinal lymph nodes as demonstrated on preceding abdominal CT with index inguinal lymph node measuring 1.3 cm in greatest short axis diameter (image 49). There is a moderate amount of subcutaneous edema at the level of the left lower leg and calf. IMPRESSION: 1. No evidence of DVT within the left lower  extremity. 2. Pathologically enlarged left inguinal lymph nodes, similar to preceding abdominal CT performed 06/28/2022. Electronically Signed   By: Simonne Come M.D.   On: 06/15/2022 10:59   DG Chest Port 1V same Day  Result Date: 06/15/2022 CLINICAL DATA:  Dyspnea EXAM: PORTABLE CHEST 1 VIEW COMPARISON:  08/15/2019, CT 06/05/2022 FINDINGS:  Stable coarsening of the pulmonary interstitium. The lungs are symmetrically well inflated. Known spiculated pulmonary nodule within the peripheral right upper lobe seen on prior CT examination is not well appreciated on this exam. New retrocardiac opacity in keeping with a focal pulmonary infiltrate within this region, possibly infectious in the acute setting. No pneumothorax or pleural effusion. Cardiac size within normal limits. Central pulmonary vascular congestion without overt pulmonary edema. IMPRESSION: 1. New retrocardiac pulmonary infiltrate, possibly infectious in the acute setting. 2. Central pulmonary vascular congestion without overt pulmonary edema. 3. Known right upper lobe spiculated pulmonary nodule not well appreciated on this exam. Electronically Signed   By: Helyn Numbers M.D.   On: 06/15/2022 00:48   CT ABDOMEN PELVIS WO CONTRAST  Result Date: 07/02/2022 CLINICAL DATA:  Abdominal pain, nausea and diarrhea, history of colon cancer EXAM: CT ABDOMEN AND PELVIS WITHOUT CONTRAST TECHNIQUE: Multidetector CT imaging of the abdomen and pelvis was performed following the standard protocol without IV contrast. Unenhanced CT was performed per clinician order. Lack of IV contrast limits sensitivity and specificity, especially for evaluation of abdominal/pelvic solid viscera. RADIATION DOSE REDUCTION: This exam was performed according to the departmental dose-optimization program which includes automated exposure control, adjustment of the mA and/or kV according to patient size and/or use of iterative reconstruction technique. COMPARISON:  05/16/2022 FINDINGS: Lower chest: New consolidation within the posterior left lower lobe at the costophrenic angle, which could be atelectasis or acute airspace disease. No pleural effusion. Hepatobiliary: Unremarkable unenhanced appearance of the liver. The gallbladder is moderately distended, without evidence of cholelithiasis or cholecystitis. Pancreas:  Unremarkable unenhanced appearance. Spleen: Unremarkable unenhanced appearance. Adrenals/Urinary Tract: Bilateral hydronephrosis is again identified, as seen on recent chest CT. There is also bilateral hydroureter to the level of the base of the bladder. There is marked posterior bladder wall thickening, measuring up to 14 mm. High attenuation material seen within the bladder surrounding the Foley catheter balloon consistent with blood products. Differential diagnosis include cystitis, chronic scarring from previous radiation therapy in a patient with a history of colon cancer, or bladder neoplasm. Urology consultation recommended. There are bilateral nonobstructing renal calculi, measuring 5 mm on the left and 3 mm on the right. Simple appearing right renal cysts do not require specific imaging follow-up. The adrenals are unremarkable. Stomach/Bowel: Postsurgical changes are seen from distal colectomy and reanastomosis. There is segmental wall thickening of the descending colon extending to the level of the anastomosis, compatible with inflammatory or infectious colitis. Moderate distension of the colon with retained gas and stool. No evidence of small-bowel obstruction. Vascular/Lymphatic: Lymphadenopathy has developed within the bilateral inguinal regions and retroperitoneum. Index lymph node in the left inguinal region reference image 76/2 measures 15 mm in short axis. Left para-aortic adenopathy measures up to 14 mm in short axis reference image 43/2. There is extensive atherosclerosis throughout the aorta and its distal branches. Evaluation of the vascular lumen is limited without IV contrast. Reproductive: The prostate is enlarged, measuring 5.7 x 4.8 cm in cross-sectional diameter. Other: There is diffuse fat stranding in the perirectal region, surrounding the prostate, and adjacent to the wall thickening at the base of the bladder. This is  nonspecific, and could be related to prior radiation therapy. There  is no free intraperitoneal fluid or free intraperitoneal gas. There are fat containing midline ventral hernias. The periumbilical fat containing ventral hernia on image 53 demonstrates stranding within the herniated fat, could reflect incarceration. No bowel herniation. There is also evidence of prior ventral hernia repair. Musculoskeletal: No acute or destructive bony lesions. Stable severe right hip osteoarthritis. Reconstructed images demonstrate no additional findings. IMPRESSION: 1. Marked wall thickening of the posterior aspect of the bladder, measuring up to 14 mm. This results in severe bilateral hydroureteronephrosis. Differential diagnosis includes neoplasm, post radiation change, or cystitis. High attenuation material surrounding the Foley catheter within the bladder lumen consistent with blood products. Urology consultation and cystoscopy recommended. 2. Segmental wall thickening of the distal colon, extending from the splenic flexure through the rectosigmoid anastomosis, suspicious for underlying inflammatory or infectious colitis. 3. New retroperitoneal and bilateral inguinal lymphadenopathy. Metastatic disease not excluded. 4. New dependent consolidation within the left lower lobe posterior costophrenic angle, consistent with pneumonia or atelectasis. 5. Fat containing midline ventral hernias. The inferior ventral hernia demonstrates stranding within the herniated fat, incarcerated hernia not excluded. No bowel herniation. 6. Enlarged prostate. 7. Nonspecific fat stranding within the lower pelvis surrounding the rectosigmoid anastomosis, prostate, and bladder base. This could be sequela of prior radiation therapy. 8.  Aortic Atherosclerosis (ICD10-I70.0). 9. Bilateral nonobstructing renal calculi. Electronically Signed   By: Sharlet Salina M.D.   On: 07/02/2022 17:06   CT Chest Wo Contrast  Result Date: 06/09/2022 CLINICAL DATA:  History of colon cancer. Status post chemotherapy and radiation.  Lung nodules. EXAM: CT CHEST WITHOUT CONTRAST TECHNIQUE: Multidetector CT imaging of the chest was performed following the standard protocol without IV contrast. RADIATION DOSE REDUCTION: This exam was performed according to the departmental dose-optimization program which includes automated exposure control, adjustment of the mA and/or kV according to patient size and/or use of iterative reconstruction technique. COMPARISON:  CT 09/26/2021 and older FINDINGS: Cardiovascular: Heart is nonenlarged. No pericardial effusion. Coronary artery calcifications are seen. The thoracic aorta has some scattered vascular calcifications. Tortuous course of the descending thoracic aorta. Mediastinum/Nodes: Small hiatal hernia. Normal caliber thoracic esophagus. The esophagus is slightly patulous. Heterogeneous thyroid gland. No specific abnormal lymph node enlargement identified in the axillary region or hilum on this noncontrast examination. There are some small mediastinal nodes, nonpathologic by size criteria. This includes retrocrural. Example series 2, image 132 right retrocrural measures 12 x 9 mm today and previously 12 x 8 mm. Lungs/Pleura: Breathing motion. Dependent atelectasis. No consolidation, pneumothorax or effusion. Upper lung zone centrilobular emphysematous lung changes are identified. Apical pleural thickening on the left-greater-than-right. Spiculated semi-solid nodule seen in the right upper lobe on the prior that measured 16 mm overall, today on series 2, image 62 again measures 16 by 10 mm, unchanged when measured in the same fashion. The solid component more superior the was measured at 9 mm is stable. On the study of December 2021, the lesion is remeasured in the same fashion as today and at that time would have measured 15 x 9 mm. Very similar. Left upper lobe 5 mm nodule at the apex is stable today on series 2, image 32. This lesion has been stable since December 2021. Upper Abdomen: In the upper abdomen  the adrenal glands are diffusely thickened and nodular. New collecting system dilatation of the kidneys. Etiology is uncertain. Musculoskeletal: Diffuse degenerative changes seen along the spine. IMPRESSION: Bilateral lung nodule seen previously are  similar today going back to a study of December 2021. Long-term stability. No specific additional imaging follow-up of the lung nodules. Underlying emphysematous lung changes. However there is new collecting system dilatation of the kidneys at the edge of the imaging field of uncertain etiology. Recommend dedicated abdominal imaging to confirm etiology and correlate with known history. Findings will be called to the ordering service by the Radiology physician assistant team Aortic Atherosclerosis (ICD10-I70.0) and Emphysema (ICD10-J43.9). Electronically Signed   By: Karen Kays M.D.   On: 06/09/2022 15:15    Microbiology Recent Results (from the past 240 hour(s))  Blood culture (routine x 2)     Status: None   Collection Time: 07/01/2022  2:59 PM   Specimen: Right Antecubital; Blood  Result Value Ref Range Status   Specimen Description   Final    RIGHT ANTECUBITAL BOTTLES DRAWN AEROBIC AND ANAEROBIC   Special Requests   Final    Blood Culture results may not be optimal due to an excessive volume of blood received in culture bottles   Culture   Final    NO GROWTH 5 DAYS Performed at [facility], [address], [place], [place] [address]    Report Status 06/19/2022 FINAL  Final  Blood culture (routine x 2)     Status: Abnormal   Collection Time: 06/11/2022  3:12 PM   Specimen: Left Antecubital; Blood  Result Value Ref Range Status   Specimen Description   Final    LEFT ANTECUBITAL BOTTLES DRAWN AEROBIC AND ANAEROBIC Performed at [facility], [address], [place], [place] [address]    Special Requests   Final    Blood Culture results may not be optimal due to an excessive volume of blood received in culture bottles Performed at [facility], [address], [place], [place] [address]    Culture  Setup Time   Final    GRAM POSITIVE COCCI Gram Stain Report Called to,Read Back By and Verified With: M. CATES LAND @ 06/15/22 ANAEROBIC BOTTLE ONLY CRITICAL RESULT CALLED TO, READ BACK BY AND VERIFIED WITH: PHARMD LAURIE POOLE ON 06/15/22 @ 1715 BY DRT    Culture (A)  Final    AEROCOCCUS VIRIDANS Standardized susceptibility testing for this organism is not available. Performed at [facility], [address] [place], [place] [address]    Report Status 06/17/2022 FINAL  Final  Blood Culture ID Panel (Reflexed)     Status: None   Collection Time: 06/19/2022  3:12 PM  Result Value Ref Range Status   Enterococcus faecalis NOT DETECTED NOT DETECTED Final   Enterococcus Faecium NOT DETECTED NOT DETECTED Final   Listeria monocytogenes NOT DETECTED NOT DETECTED Final   Staphylococcus species NOT DETECTED NOT DETECTED Final   Staphylococcus aureus (BCID) NOT DETECTED NOT DETECTED Final   Staphylococcus epidermidis NOT DETECTED NOT DETECTED Final   Staphylococcus lugdunensis NOT DETECTED NOT DETECTED Final   Streptococcus species NOT DETECTED NOT DETECTED Final   Streptococcus agalactiae NOT DETECTED NOT DETECTED Final   Streptococcus pneumoniae NOT DETECTED NOT DETECTED Final   Streptococcus pyogenes NOT DETECTED NOT DETECTED Final   A.calcoaceticus-baumannii NOT DETECTED NOT DETECTED Final   Bacteroides fragilis NOT DETECTED NOT DETECTED Final   Enterobacterales NOT DETECTED NOT DETECTED Final   Enterobacter cloacae complex NOT DETECTED NOT DETECTED Final   Escherichia coli NOT DETECTED NOT DETECTED Final   Klebsiella aerogenes NOT DETECTED NOT DETECTED Final   Klebsiella oxytoca NOT DETECTED NOT DETECTED Final   Klebsiella  pneumoniae NOT DETECTED NOT DETECTED Final   Proteus species NOT DETECTED NOT DETECTED Final   Salmonella species NOT DETECTED NOT DETECTED Final   Serratia marcescens NOT DETECTED NOT DETECTED Final    Haemophilus influenzae NOT DETECTED NOT DETECTED Final   Neisseria meningitidis NOT DETECTED NOT DETECTED Final   Pseudomonas aeruginosa NOT DETECTED NOT DETECTED Final   Stenotrophomonas maltophilia NOT DETECTED NOT DETECTED Final   Candida albicans NOT DETECTED NOT DETECTED Final   Candida auris NOT DETECTED NOT DETECTED Final   Candida glabrata NOT DETECTED NOT DETECTED Final   Candida krusei NOT DETECTED NOT DETECTED Final   Candida parapsilosis NOT DETECTED NOT DETECTED Final   Candida tropicalis NOT DETECTED NOT DETECTED Final   Cryptococcus neoformans/gattii NOT DETECTED NOT DETECTED Final    Comment: Performed at [facility], [address] [place], [place] [address]  Urine Culture     Status: Abnormal   Collection Time: 07/10/2022  4:05 PM   Specimen: Urine, Clean Catch  Result Value Ref Range Status   Specimen Description   Final    URINE, CLEAN CATCH Performed at [facility], [address] [place], [place] [address]    Special Requests   Final    NONE Performed at [facility], [address] [place], [place] [address]    Culture MULTIPLE SPECIES PRESENT, SUGGEST RECOLLECTION (A)  Final   Report Status 06/15/2022 FINAL  Final  Gastrointestinal Panel by PCR , Stool     Status: None   Collection Time: 06/15/22  2:14 PM   Specimen: STOOL  Result Value Ref Range Status   Campylobacter species NOT DETECTED NOT DETECTED Final   Plesimonas shigelloides NOT DETECTED NOT DETECTED Final   Salmonella species NOT DETECTED NOT DETECTED Final   Yersinia enterocolitica NOT DETECTED NOT DETECTED Final   Vibrio species NOT DETECTED NOT DETECTED Final   Vibrio cholerae NOT DETECTED NOT DETECTED Final   Enteroaggregative E coli (EAEC) NOT DETECTED NOT DETECTED Final   Enteropathogenic E coli (EPEC) NOT DETECTED NOT DETECTED Final   Enterotoxigenic E coli (ETEC) NOT DETECTED NOT DETECTED Final   Shiga like toxin producing E coli (STEC) NOT DETECTED NOT DETECTED Final    Shigella/Enteroinvasive E coli (EIEC) NOT DETECTED NOT DETECTED Final   Cryptosporidium NOT DETECTED NOT DETECTED Final   Cyclospora cayetanensis NOT DETECTED NOT DETECTED Final   Entamoeba histolytica NOT DETECTED NOT DETECTED Final   Giardia lamblia NOT DETECTED NOT DETECTED Final   Adenovirus F40/41 NOT DETECTED NOT DETECTED Final   Astrovirus NOT DETECTED NOT DETECTED Final   Norovirus GI/GII NOT DETECTED NOT DETECTED Final   Rotavirus A NOT DETECTED NOT DETECTED Final   Sapovirus (I, II, IV, and V) NOT DETECTED NOT DETECTED Final    Comment: Performed at [facility], [address] [place], [place] [address]  C Difficile Quick Screen w PCR reflex     Status: None   Collection Time: 06/15/22  2:14 PM   Specimen: STOOL  Result Value Ref Range Status   C Diff antigen NEGATIVE NEGATIVE Final   C Diff toxin NEGATIVE NEGATIVE Final   C Diff interpretation No C. difficile detected.  Final    Comment: Performed at [facility], [address] [place], [place] [address]    Lab Basic Metabolic Panel: Recent Labs  Lab 06/17/22 0410 06/18/22 0431 06/19/22 0421 06/20/22 0409 06/20/22 0954 06/20/22 1453 06/21/22 0949  NA 142 141 141 142 141 139 140  K 3.8  3.4* 3.4* 3.1* 3.2* 3.6 4.2  CL 108 107 106 105 101  --  101  CO2 21* 24 23 25 23   --  21*  GLUCOSE 109* 130* 117* 127* 238*  --  148*  BUN 87* 76* 65* 64* 69*  --  80*  CREATININE 5.91* 4.96* 4.08* 3.75* 4.07*  --  4.66*  CALCIUM 8.2* 7.9* 8.1* 8.5* 8.8*  --  8.5*  MG  --  1.5*  --  1.8  --   --  1.6*  PHOS 6.3* 5.5* 4.1 4.1  --   --  5.8*   Liver Function Tests: Recent Labs  Lab 06/18/22 0431 06/19/22 0421 06/20/22 0409 06/20/22 0954 06/21/22 0949  AST  --   --   --  27  --   ALT  --   --   --  16  --   ALKPHOS  --   --   --  54  --   BILITOT  --   --   --  0.7  --   PROT  --   --   --  7.1  --   ALBUMIN 2.6* 2.6* 2.7* 2.9* 2.2*   No results for input(s): "LIPASE", "AMYLASE" in the last 168 hours. No  results for input(s): "AMMONIA" in the last 168 hours. CBC: Recent Labs  Lab 06/17/22 0410 06/18/22 0431 06/19/22 0421 06/20/22 0954 06/20/22 1453 06/21/22 0949  WBC 11.8* 10.4 11.5* 18.7*  --  22.5*  HGB 9.4* 8.7* 9.7* 10.4* 10.2* 8.9*  HCT 29.6* 27.1* 30.7* 34.1* 30.0* 28.0*  MCV 96.7 98.9 97.8 101.5*  --  98.2  PLT 394 366 406* 494*  --  348   Cardiac Enzymes: No results for input(s): "CKTOTAL", "CKMB", "CKMBINDEX", "TROPONINI" in the last 168 hours. Sepsis Labs: Recent Labs  Lab 06/18/22 0431 06/19/22 0421 06/20/22 0954 06/21/22 0949  WBC 10.4 11.5* 18.7* 22.5*  LATICACIDVEN  --   --  6.4*  --     Procedures/Operations  Mechanical ventilation.    Antia Rahal 06/30/2022, 5:46 PM

## 2022-07-11 NOTE — Progress Notes (Signed)
Pt taken off Heated HFNC 25L 100% and placed on home O2 2L by RT per CCM MD for pt comfort.

## 2022-07-11 NOTE — Progress Notes (Signed)
Chaplain responded to [facility] consult for End of Life support. Chaplain arrived to discover that [name] had just died. He was surrounded by [name] and [name] and [name]. [name] and [name] have lived in [place] for 20 years but [name] life in [place] with their families. Chaplain asked open ended questions to facilitate story telling and emotional expression. The Sandos [name] how difficult this week has been as they have learned about [name] after another and realized just how sick [name] was. They also lamented a few times when communication from the medical team confused them and they found themselves unaware of how grave the situation was. [name] that she longed for more time, but was feeling okay in the moment. Chaplain normalized the variety of grief responses that occur over time and encouraged the practice of self care in the midst of the business of funeral planning, etc. Chaplain also encouraged them to share their experience in follow up calls and surveys from the hospital.  The family expressed their gratitude for the medical team which they report have been incredibly supportive particularly since they were [place] [facility].   [name]. Carley Hammed, M.Div. [facility] Chaplain Pager [phone] Office [phone]      06/10/2022 1138  Spiritual Encounters  Type of Visit Initial  Care provided to: Family  Referral source Other (comment)  Reason for visit Patient death  Spiritual Framework  Presenting Themes Significant life change;Coping tools;Impactful experiences and emotions  Community/Connection Family  Family Stress Factors Loss  Interventions  Spiritual Care Interventions Made Established relationship of care and support;Compassionate presence;Reflective listening;Normalization of emotions  Intervention Outcomes  Outcomes Connection to spiritual care;Awareness of support

## 2022-07-11 NOTE — Progress Notes (Addendum)
06/21/2022   I have seen and evaluated the patient for end-of-life care   S:  Currently on a fentanyl drip, intermittent moaning.  Family at bedside   O: Blood pressure 96/66, pulse 64, temperature 97.6 F (36.4 C), temperature source Oral, resp. rate 17, height 5\' 3"  (1.6 m), weight 81.6 kg, SpO2 94 %.    Chronically ill-appearing man resting comfortably in bed Scattered rattling upper airway sounds, respiratory rate around 12, no assessor muscle use Abdomen soft, bilateral nephrostomy tubes in place   A:  End-of-life care Advanced COPD, acute renal failure, presumed advanced bladder cancer  P:  Will add a morphine drip, switch out to heated high flow nasal cannula for normal nasal cannula to encourage patient comfort, Haldol and Ativan as needed for agitation and anxiety respectively.  In-hospital death expected, condolences offered to family    Myrla Halsted MD [facility] Prefer epic messenger for cross cover needs If after hours, please call E-link

## 2022-07-11 DEATH — deceased
